# Patient Record
Sex: Female | Born: 1945 | Race: White | Hispanic: No | Smoking: Former smoker
Health system: Southern US, Community
[De-identification: ages and names within clinical notes are randomized; demographics above are authoritative.]

## PROBLEM LIST (undated history)

## (undated) DIAGNOSIS — E669 Obesity, unspecified: Secondary | ICD-10-CM

## (undated) DIAGNOSIS — F32A Depression, unspecified: Secondary | ICD-10-CM

## (undated) DIAGNOSIS — J439 Emphysema, unspecified: Secondary | ICD-10-CM

## (undated) DIAGNOSIS — D649 Anemia, unspecified: Secondary | ICD-10-CM

## (undated) DIAGNOSIS — J189 Pneumonia, unspecified organism: Secondary | ICD-10-CM

## (undated) DIAGNOSIS — R262 Difficulty in walking, not elsewhere classified: Secondary | ICD-10-CM

## (undated) DIAGNOSIS — F329 Major depressive disorder, single episode, unspecified: Secondary | ICD-10-CM

## (undated) DIAGNOSIS — K5792 Diverticulitis of intestine, part unspecified, without perforation or abscess without bleeding: Secondary | ICD-10-CM

## (undated) DIAGNOSIS — B0229 Other postherpetic nervous system involvement: Secondary | ICD-10-CM

## (undated) DIAGNOSIS — M719 Bursopathy, unspecified: Secondary | ICD-10-CM

## (undated) DIAGNOSIS — D369 Benign neoplasm, unspecified site: Secondary | ICD-10-CM

## (undated) DIAGNOSIS — T148XXA Other injury of unspecified body region, initial encounter: Secondary | ICD-10-CM

## (undated) DIAGNOSIS — M2550 Pain in unspecified joint: Secondary | ICD-10-CM

## (undated) DIAGNOSIS — E039 Hypothyroidism, unspecified: Secondary | ICD-10-CM

## (undated) DIAGNOSIS — M419 Scoliosis, unspecified: Secondary | ICD-10-CM

## (undated) DIAGNOSIS — E7800 Pure hypercholesterolemia, unspecified: Secondary | ICD-10-CM

## (undated) DIAGNOSIS — Z8739 Personal history of other diseases of the musculoskeletal system and connective tissue: Secondary | ICD-10-CM

## (undated) DIAGNOSIS — G8929 Other chronic pain: Secondary | ICD-10-CM

## (undated) DIAGNOSIS — K589 Irritable bowel syndrome without diarrhea: Secondary | ICD-10-CM

## (undated) DIAGNOSIS — I82409 Acute embolism and thrombosis of unspecified deep veins of unspecified lower extremity: Secondary | ICD-10-CM

## (undated) DIAGNOSIS — F429 Obsessive-compulsive disorder, unspecified: Secondary | ICD-10-CM

## (undated) DIAGNOSIS — F439 Reaction to severe stress, unspecified: Secondary | ICD-10-CM

## (undated) DIAGNOSIS — M545 Low back pain, unspecified: Secondary | ICD-10-CM

## (undated) DIAGNOSIS — C55 Malignant neoplasm of uterus, part unspecified: Secondary | ICD-10-CM

## (undated) DIAGNOSIS — IMO0002 Reserved for concepts with insufficient information to code with codable children: Secondary | ICD-10-CM

## (undated) DIAGNOSIS — G4733 Obstructive sleep apnea (adult) (pediatric): Secondary | ICD-10-CM

## (undated) DIAGNOSIS — K219 Gastro-esophageal reflux disease without esophagitis: Secondary | ICD-10-CM

## (undated) DIAGNOSIS — I10 Essential (primary) hypertension: Secondary | ICD-10-CM

## (undated) DIAGNOSIS — M1990 Unspecified osteoarthritis, unspecified site: Secondary | ICD-10-CM

## (undated) DIAGNOSIS — J309 Allergic rhinitis, unspecified: Secondary | ICD-10-CM

## (undated) DIAGNOSIS — F0390 Unspecified dementia without behavioral disturbance: Secondary | ICD-10-CM

## (undated) DIAGNOSIS — M797 Fibromyalgia: Secondary | ICD-10-CM

## (undated) HISTORY — DX: Pure hypercholesterolemia, unspecified: E78.00

## (undated) HISTORY — PX: OTHER SURGICAL HISTORY: SHX169

## (undated) HISTORY — DX: Emphysema, unspecified: J43.9

## (undated) HISTORY — DX: Unspecified osteoarthritis, unspecified site: M19.90

## (undated) HISTORY — DX: Personal history of other diseases of the musculoskeletal system and connective tissue: Z87.39

## (undated) HISTORY — DX: Irritable bowel syndrome, unspecified: K58.9

## (undated) HISTORY — PX: HYSTEROTOMY: SHX1776

## (undated) HISTORY — DX: Malignant neoplasm of uterus, part unspecified: C55

## (undated) HISTORY — DX: Obesity, unspecified: E66.9

## (undated) HISTORY — DX: Pain in unspecified joint: M25.50

## (undated) HISTORY — DX: Reserved for concepts with insufficient information to code with codable children: IMO0002

## (undated) HISTORY — DX: Hypothyroidism, unspecified: E03.9

## (undated) HISTORY — DX: Obsessive-compulsive disorder, unspecified: F42.9

## (undated) HISTORY — DX: Scoliosis, unspecified: M41.9

## (undated) HISTORY — PX: BREAST LUMPECTOMY: SHX2

## (undated) HISTORY — DX: Benign neoplasm, unspecified site: D36.9

## (undated) HISTORY — DX: Obstructive sleep apnea (adult) (pediatric): G47.33

## (undated) HISTORY — PX: TONSILLECTOMY: SUR1361

## (undated) HISTORY — DX: Allergic rhinitis, unspecified: J30.9

## (undated) HISTORY — DX: Gastro-esophageal reflux disease without esophagitis: K21.9

## (undated) HISTORY — DX: Bursopathy, unspecified: M71.9

## (undated) HISTORY — DX: Low back pain, unspecified: M54.50

## (undated) HISTORY — DX: Other chronic pain: G89.29

## (undated) HISTORY — DX: Major depressive disorder, single episode, unspecified: F32.9

## (undated) HISTORY — PX: CHOLECYSTECTOMY: SHX55

## (undated) HISTORY — DX: Other postherpetic nervous system involvement: B02.29

## (undated) HISTORY — PX: ABDOMINAL HYSTERECTOMY: SHX81

## (undated) HISTORY — DX: Low back pain: M54.5

## (undated) HISTORY — DX: Diverticulitis of intestine, part unspecified, without perforation or abscess without bleeding: K57.92

## (undated) HISTORY — DX: Depression, unspecified: F32.A

## (undated) HISTORY — DX: Reaction to severe stress, unspecified: F43.9

## (undated) HISTORY — DX: Essential (primary) hypertension: I10

---

## 1999-11-01 ENCOUNTER — Ambulatory Visit: Admission: RE | Admit: 1999-11-01 | Discharge: 1999-11-01 | Payer: Self-pay | Admitting: Endocrinology

## 2008-04-20 ENCOUNTER — Emergency Department (HOSPITAL_COMMUNITY): Admission: EM | Admit: 2008-04-20 | Discharge: 2008-04-20 | Payer: Self-pay | Admitting: Emergency Medicine

## 2008-04-25 ENCOUNTER — Encounter: Admission: RE | Admit: 2008-04-25 | Discharge: 2008-04-25 | Payer: Self-pay | Admitting: Internal Medicine

## 2008-07-26 ENCOUNTER — Encounter (INDEPENDENT_AMBULATORY_CARE_PROVIDER_SITE_OTHER): Payer: Self-pay | Admitting: Surgery

## 2008-07-26 ENCOUNTER — Ambulatory Visit (HOSPITAL_COMMUNITY): Admission: RE | Admit: 2008-07-26 | Discharge: 2008-07-27 | Payer: Self-pay | Admitting: Surgery

## 2008-10-16 ENCOUNTER — Emergency Department (HOSPITAL_COMMUNITY): Admission: EM | Admit: 2008-10-16 | Discharge: 2008-10-16 | Payer: Self-pay | Admitting: Emergency Medicine

## 2011-01-12 ENCOUNTER — Encounter: Payer: Self-pay | Admitting: Internal Medicine

## 2011-04-03 ENCOUNTER — Ambulatory Visit
Admission: RE | Admit: 2011-04-03 | Discharge: 2011-04-03 | Disposition: A | Discharge status: Home / Self Care | Payer: Medicare Other | Source: Ambulatory Visit | Attending: Internal Medicine | Admitting: Internal Medicine

## 2011-04-03 ENCOUNTER — Other Ambulatory Visit: Payer: Self-pay | Admitting: Internal Medicine

## 2011-04-09 ENCOUNTER — Encounter: Payer: Self-pay | Admitting: Internal Medicine

## 2011-05-06 NOTE — Assessment & Plan Note (Signed)
[facility]                                 ON-CALL NOTE   NAME:Klein, Joanne                        MRN:          [id number]  DATE:04/20/2008                            DOB:          1946/10/22    PRIMARY CARE PHYSICIAN:  Barbette Hair. [name], MD, [facility]   HISTORY OF PRESENT ILLNESS:  Joanne Klein called tonight.  She is a  patient of Dr. Marzetta Board who has not seen him in 7-8 years.  She has had  nausea for 2-3 weeks and worsening abdominal pain that has become  extreme.  She says her stomach is blown up and she is quite distended  and in a lot of pain.  She has no fever or chills.  She has some  irritable bowels.  Her back is hurting from such severe pain and her  stomach as well.   I instructed her that she should be evaluated in the emergency room.  It  is possible that she has a bowel obstruction.  She has also been  recently started on some new antidepressant medications, but both may be  contributing.     Rachael Fee, MD  Electronically Signed    DPJ/MedQ  DD: 04/20/2008  DT: 04/20/2008  Job #: [id number]   cc:   Barbette Hair. [name], MD,FACG

## 2011-05-06 NOTE — Op Note (Signed)
Joanne Klein, TONNER               ACCOUNT NO.:  [id number]   MEDICAL RECORD NO.:  [id number]          PATIENT TYPE:  OIB   LOCATION:  5127                         FACILITY:  [facility]   PHYSICIAN:  Maisie Fus A. Cornett, M.D.DATE OF BIRTH:  1946-09-21   DATE OF PROCEDURE:  07/26/2008  DATE OF DISCHARGE:                               OPERATIVE REPORT   PREOPERATIVE DIAGNOSIS:  Symptomatic cholelithiasis.   POSTOPERATIVE DIAGNOSIS:  Symptomatic cholelithiasis.   PROCEDURE:  Laparoscopic cholecystectomy.   SURGEON:  Maisie Fus A. Cornett, MD   ASSISTANT:  Ollen Gross. Vernell Morgans, MD   ANESTHESIA:  General endotracheal anesthesia with 0.25% Sensorcaine  local.   ESTIMATED BLOOD LOSS:  20 mL.   SPECIMEN:  Gallbladder to pathology.   DRAINS:  None.   INDICATIONS FOR PROCEDURE:  The patient is a 65 year old female with  symptomatic cholelithiasis.  She presents today for laparoscopic  cholecystectomy for this.   DESCRIPTION OF PROCEDURE:  The patient was brought to the operating room  and placed supine.  After induction of general anesthesia, the abdomen  was prepped and draped in sterile fashion.  A 1-cm supraumbilical  incision was made.  Dissection was carried down to her fascia.  Her  fascia was opened with a scalpel blade.  Kocher was used to grab the  fascia and I used my finger to push the peritoneal lining in the  abdominal cavity.  Pursestring suture of 0 Vicryl was placed and a 12-mm  cannula was placed under direct vision.  Pneumoperitoneum was created to  15 mmHg of CO2 and a laparoscope was placed.  She was placed in reverse  Trendelenburg and rolled to her left.  We then placed an 11-mm  subxiphoid port under direct vision.  Two 5-mm ports were placed in the  right mid abdomen both under direct vision.  The gallbladder was  identified and grabbed by its dome.  The omental adhesions were taken  down.  A second grasper was used to grab the gallbladder at the  infundibulum and pull  it toward the patient's right lower quadrant.  We  used cautery to score the peritoneal lining to expose the infundibulum.  We found the neck of the gallbladder and then found the cystic duct.  We  were able to dissect circumferentially around the neck of the  gallbladder and cystic duct of the gallbladder.  Clips were placed in  the gallbladder side.  A small incision was made in the cystic duct.  We  did this though this was actually in the neck of the gallbladder and  this opened up widely.  I dissected little bit lower around the cystic  duct and got around this circumferentially.  We then tried to slide a  cholangiogram catheter through the neck of the gallbladder but could not  due to obstruction more than likely from a valve since no stones could  be squeezed out of this area.  Her liver function studies were normal  and despite numerous attempts, I could not get the cystic duct catheter  in the cystic duct safely and I was afraid  I was going to tear it and I  felt that since her anatomy seemed very clear to me, we go ahead and  triple clip the cystic duct and divide it and that was actually up at  the neck of the gallbladder.  We went ahead and securely clipped the  cystic duct and divided it.  We then encountered the cystic artery and  actually took these down between clips with its individual branches.  Once we were able to take the cystic artery and its branches down to  include posterior branch as well, we used cautery to dissect the  gallbladder from the gallbladder fossa.  There was a small hole in the  gallbladder with leakage of bile with no stones.  We then inspected the  cystic duct stump remnant as well as clips in the cystic artery and felt  these all to be intact without evidence of bile leakage.  The remainder  of the gallbladder was then put in a EndoCatch bag and extracted through  the umbilicus with closure of the fascial port site with the pursestring  suture of 0  Vicryl already in place.  We reinspected the gallbladder  fossa and found to be hemostatic.  Clips were on the cystic duct and  cystic artery and small branches of the cystic artery.  No evidence of  bile leakage or any aberrant anatomy was noted upon inspection.  At this  point in time, we suctioned out the irrigation.  We then were able to  remove all of our ports under direct vision and allowed the CO2 to  escape.  Skin was closed with 4-0 Monocryl.  Dermabond was applied.  All  final counts of sponge, needle, and instruments were found be correct at  this portion of the case.  The patient was then awoke and taken to  recovery in satisfactory condition.      Thomas A. Cornett, M.D.  Electronically Signed     TAC/MEDQ  D:  07/26/2008  T:  07/27/2008  Job:  [id number]   cc:   Fleet Contras, MD

## 2011-09-16 LAB — DIFFERENTIAL
Basophils Absolute: 0.1
Basophils Relative: 1
Eosinophils Absolute: 0.3
Eosinophils Relative: 3
Lymphocytes Relative: 30
Lymphs Abs: 3.1
Monocytes Absolute: 0.9
Monocytes Relative: 9
Neutro Abs: 6
Neutrophils Relative %: 58

## 2011-09-16 LAB — COMPREHENSIVE METABOLIC PANEL
ALT: 19
AST: 18
Albumin: 3.7
Alkaline Phosphatase: 85
BUN: 13
CO2: 25
Calcium: 9.5
Chloride: 97
Creatinine, Ser: 1
GFR calc Af Amer: >60
GFR calc non Af Amer: 57 — ABNORMAL LOW
Glucose, Bld: 158 — ABNORMAL HIGH
Potassium: 4.2
Sodium: 135
Total Bilirubin: 0.9
Total Protein: 7

## 2011-09-16 LAB — URINALYSIS, ROUTINE W REFLEX MICROSCOPIC
Bilirubin Urine: NEGATIVE
Glucose, UA: NEGATIVE
Hgb urine dipstick: NEGATIVE
Ketones, ur: NEGATIVE
Nitrite: NEGATIVE
Protein, ur: NEGATIVE
Specific Gravity, Urine: 1.021
Urobilinogen, UA: 1
pH: 6

## 2011-09-16 LAB — CBC
HCT: 40
Hemoglobin: 13.4
MCHC: 33.6
MCV: 86.3
Platelets: 327
RBC: 4.63
RDW: 14.4
WBC: 10.4

## 2011-09-16 LAB — URINE CULTURE: Colony Count: 80000

## 2011-09-16 LAB — URINE MICROSCOPIC-ADD ON

## 2011-09-16 LAB — LIPASE, BLOOD: Lipase: 29

## 2011-09-19 LAB — COMPREHENSIVE METABOLIC PANEL
ALT: 20
AST: 17
Albumin: 3.6
Alkaline Phosphatase: 100
BUN: 20
CO2: 25
Calcium: 9.5
Chloride: 106
Creatinine, Ser: 0.79
GFR calc Af Amer: >60
GFR calc non Af Amer: >60
Glucose, Bld: 91
Potassium: 4.4
Sodium: 139
Total Bilirubin: 0.7
Total Protein: 6.8

## 2011-09-19 LAB — CBC
HCT: 42.3
Hemoglobin: 14.3
MCHC: 33.9
MCV: 84.5
Platelets: 279
RBC: 5.01
RDW: 14
WBC: 8.4

## 2011-09-19 LAB — DIFFERENTIAL
Basophils Absolute: 0
Basophils Relative: 1
Eosinophils Absolute: 0.2
Eosinophils Relative: 2
Lymphocytes Relative: 30
Lymphs Abs: 2.5
Monocytes Absolute: 1
Monocytes Relative: 12
Neutro Abs: 4.7
Neutrophils Relative %: 56

## 2011-09-22 LAB — DIFFERENTIAL
Basophils Absolute: 0.1
Basophils Relative: 1
Eosinophils Absolute: 0.3
Eosinophils Relative: 3
Lymphocytes Relative: 29
Lymphs Abs: 2.4
Monocytes Absolute: 0.8
Monocytes Relative: 10
Neutro Abs: 4.9
Neutrophils Relative %: 58

## 2011-09-22 LAB — POCT I-STAT, CHEM 8
BUN: 15
Calcium, Ion: 1.2
Chloride: 103
Creatinine, Ser: 1
Glucose, Bld: 112 — ABNORMAL HIGH
HCT: 40
Hemoglobin: 13.6
Potassium: 4
Sodium: 142
TCO2: 30

## 2011-09-22 LAB — PROTIME-INR
INR: 1
Prothrombin Time: 13.2

## 2011-09-22 LAB — CBC
HCT: 39.5
Hemoglobin: 13.4
MCHC: 33.8
MCV: 85.3
Platelets: 239
RBC: 4.63
RDW: 14
WBC: 8.4

## 2011-09-22 LAB — APTT: aPTT: 26

## 2012-09-30 LAB — LIPID PANEL
Cholesterol: 169 mg/dL (ref 0–200)
HDL: 48 mg/dL (ref 35–70)
LDL Cholesterol: 101 mg/dL
LDl/HDL Ratio: 3.5
Triglycerides: 98 mg/dL (ref 40–160)

## 2012-09-30 LAB — BASIC METABOLIC PANEL
BUN: 19 mg/dL (ref 4–21)
Creatinine: 0.9 mg/dL (ref 0.5–1.1)
Glucose: 94 mg/dL
Potassium: 4.6 mmol/L (ref 3.4–5.3)
Sodium: 143 mmol/L (ref 137–147)

## 2012-09-30 LAB — HEPATIC FUNCTION PANEL
ALT: 13 U/L (ref 7–35)
AST: 14 U/L (ref 13–35)
Alkaline Phosphatase: 86 U/L (ref 25–125)
Bilirubin, Total: 0.6 mg/dL

## 2012-09-30 LAB — HEMOGLOBIN A1C: Hgb A1c MFr Bld: 6 % (ref 4.0–6.0)

## 2013-01-12 ENCOUNTER — Encounter: Payer: Self-pay | Admitting: Hematology

## 2013-04-21 LAB — HM MAMMOGRAPHY

## 2013-08-01 ENCOUNTER — Encounter: Payer: Self-pay | Admitting: Gastroenterology

## 2013-09-02 ENCOUNTER — Encounter: Payer: Self-pay | Admitting: Gastroenterology

## 2013-09-06 ENCOUNTER — Ambulatory Visit: Payer: Medicare Other | Admitting: Gastroenterology

## 2013-11-21 ENCOUNTER — Encounter: Payer: Self-pay | Admitting: Gastroenterology

## 2013-11-21 ENCOUNTER — Ambulatory Visit (INDEPENDENT_AMBULATORY_CARE_PROVIDER_SITE_OTHER): Payer: Medicare Other | Admitting: Gastroenterology

## 2013-11-21 VITALS — BP 126/84 | HR 101 | Ht 67.0 in | Wt 282.0 lb

## 2013-11-21 DIAGNOSIS — R110 Nausea: Secondary | ICD-10-CM | POA: Insufficient documentation

## 2013-11-21 DIAGNOSIS — K625 Hemorrhage of anus and rectum: Secondary | ICD-10-CM

## 2013-11-21 DIAGNOSIS — K5900 Constipation, unspecified: Secondary | ICD-10-CM

## 2013-11-21 NOTE — Assessment & Plan Note (Addendum)
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  Recommendations #1 patient will consider enrollment in a constipation/narcotic use trial #2 colonoscopy.  She has had an incomplete colonoscopy in the past due to difficulty with sedation and with spasm.  Patient will receive propofol and glucagon preprocedure

## 2013-11-21 NOTE — Assessment & Plan Note (Signed)
Chronic rectal bleeding this very likely do to hemorrhoids secondary to straining.  Recommendations #1 once the patient is on a regimen that releaves her from straining, I will reassess symptoms from hemorrhoids.  Should she still have persistent bleeding attributable to hemorrhoids I would consider band ligation

## 2013-11-21 NOTE — Assessment & Plan Note (Signed)
Nausea may be related to constipation.  In addition, she takes narcotics regularly which may be contributing.

## 2013-11-21 NOTE — Addendum Note (Signed)
Addended by: Marlowe Kays on: 11/21/2013 02:50 PM   Modules accepted: Orders

## 2013-11-21 NOTE — Patient Instructions (Signed)
You have been scheduled for a colonoscopy with propofol. Please follow written instructions given to you at your visit today.  Please pick up your prep kit at the pharmacy within the next 1-3 days. If you use inhalers (even only as needed), please bring them with you on the day of your procedure. Your physician has requested that you go to www.startemmi.com and enter the access code given to you at your visit today. This web site gives a general overview about your procedure. However, you should still follow specific instructions given to you by our office regarding your preparation for the procedure.  Research speaking with you today

## 2013-11-21 NOTE — Progress Notes (Signed)
History of Present Illness: 67 year old white female with fibromyalgia, sleep apnea, on chronic pain medications referred for evaluation of constipation.  This has been a chronic problem.  She may go 2-3 days without a bowel movement.  This is followed by severe straining to pass a soft stool or a scatabulous stool.  With most every bowel movement she passes bright red blood mixed with the water and stool.  Colonoscopy in the past has been unsuccessful due to too severe spasm.  She she has been examined on to the splenic flexure where diverticulosis and hemorrhoids were seen.  Last exam according to her chart was in 1998.  She complains of chronic nausea that may be assuaged with eating.  She takes narcotics chronically for back, muscle and joint pain.  She has a remote history of an esophageal stricture that was dilated over 15 years ago.    Past Medical History  Diagnosis Date  . Pure hypercholesterolemia   . Pain in joint, site unspecified   . Obesity, unspecified   . Irritable bowel syndrome   . Situational stress   . Hypothyroidism   . Diverticulitis   . Obstructive sleep apnea   . Arthritis   . Allergic rhinitis   . Scoliosis   . History of fibromyalgia   . Hypertension   . Chronic low back pain   . Neuralgia, post-herpetic    Past Surgical History  Procedure Laterality Date  . Cholecystectomy    . Breast lumpectomy      knot removed  . Tonsillectomy    . Tumor in left forearm    . Hysterotomy     family history includes Diabetes in her father; Liver cancer in her father; Stomach cancer in her father. Current Outpatient Prescriptions  Medication Sig Dispense Refill  . atorvastatin (LIPITOR) 20 MG tablet Take 20 mg by mouth 3 (three) times a week.      . cholecalciferol (VITAMIN D) 400 UNITS TABS Take by mouth daily.      Marland Kitchen Dextroamphetamine Sulfate (DEXEDRINE PO) Take 15 mg by mouth daily. ER caps-2 QD      . fluocinonide cream (LIDEX) 0.05 % Apply topically as needed.       Marland Kitchen levothyroxine (SYNTHROID, LEVOTHROID) 75 MCG tablet Take 75 mcg by mouth daily.        Marland Kitchen omeprazole (PRILOSEC) 40 MG capsule Take 40 mg by mouth daily.       Marland Kitchen oxyCODONE-acetaminophen (PERCOCET) 10-325 MG per tablet Take 1 tablet by mouth every 4 (four) hours as needed.       . Probiotic Product (ALIGN) 4 MG CAPS Take by mouth.      . SERTRALINE HCL PO Take 100 mg by mouth 2 (two) times daily. 2 tabs bid       No current facility-administered medications for this visit.   Allergies as of 11/21/2013 - Review Complete 11/21/2013  Allergen Reaction Noted  . Codeine  04/09/2011  . Morphine and related  04/09/2011  . Prozac [fluoxetine hcl]  04/09/2011  . Sulfa antibiotics  01/12/2013    reports that she has quit smoking. She has never used smokeless tobacco. She reports that she does not drink alcohol or use illicit drugs.     Review of Systems: She complains of chronic back pain, pain in her joints.  Pertinent positive and negative review of systems were noted in the above HPI section. All other review of systems were otherwise negative.  Vital signs were reviewed in today's medical  record Physical Exam: General: Obese female in no acute distress Skin: anicteric Head: Normocephalic and atraumatic Eyes:  sclerae anicteric, EOMI Ears: Normal auditory acuity Mouth: No deformity or lesions Neck: Supple, no masses or thyromegaly Lungs: Clear throughout to auscultation Heart: Regular rate and rhythm; no murmurs, rubs or bruits Abdomen: Soft,  and non distended. No masses, hepatosplenomegaly or hernias noted. Normal Bowel sounds.  There is mild tenderness in the left upper quadrant without guarding or rebound Rectal:deferred Musculoskeletal: Symmetrical with no gross deformities  Skin: No lesions on visible extremities Pulses:  Normal pulses noted Extremities: No clubbing, cyanosis, edema or deformities noted Neurological: Alert oriented x 4, grossly nonfocal Cervical Nodes:  No  significant cervical adenopathy Inguinal Nodes: No significant inguinal adenopathy Psychological:  Alert and cooperative. Normal mood and affect

## 2013-11-22 ENCOUNTER — Encounter: Payer: Self-pay | Admitting: Gastroenterology

## 2013-12-02 ENCOUNTER — Telehealth: Payer: Self-pay | Admitting: Gastroenterology

## 2013-12-02 NOTE — Telephone Encounter (Signed)
Discussed constipation with pt and suggested she try Miralax and titrate to need. Pt verbalized understanding.

## 2013-12-06 ENCOUNTER — Telehealth: Payer: Self-pay | Admitting: Gastroenterology

## 2013-12-07 MED ORDER — NA SULFATE-K SULFATE-MG SULF 17.5-3.13-1.6 GM/177ML PO SOLN: 1.0000 | Freq: Once | ORAL | Status: DC

## 2013-12-07 NOTE — Telephone Encounter (Signed)
Called pt to inform prep  sent  

## 2014-01-10 ENCOUNTER — Encounter: Payer: Medicare Other | Admitting: Gastroenterology

## 2014-01-12 ENCOUNTER — Ambulatory Visit (INDEPENDENT_AMBULATORY_CARE_PROVIDER_SITE_OTHER): Payer: Medicare HMO | Admitting: Physician Assistant

## 2014-01-12 ENCOUNTER — Encounter: Payer: Self-pay | Admitting: Physician Assistant

## 2014-01-12 VITALS — BP 130/78 | HR 82 | Temp 97.6°F | Resp 20 | Wt 279.8 lb

## 2014-01-12 DIAGNOSIS — F3289 Other specified depressive episodes: Secondary | ICD-10-CM

## 2014-01-12 DIAGNOSIS — E039 Hypothyroidism, unspecified: Secondary | ICD-10-CM

## 2014-01-12 DIAGNOSIS — K5909 Other constipation: Secondary | ICD-10-CM

## 2014-01-12 DIAGNOSIS — K5900 Constipation, unspecified: Secondary | ICD-10-CM

## 2014-01-12 DIAGNOSIS — F32A Depression, unspecified: Secondary | ICD-10-CM

## 2014-01-12 DIAGNOSIS — K219 Gastro-esophageal reflux disease without esophagitis: Secondary | ICD-10-CM

## 2014-01-12 DIAGNOSIS — F329 Major depressive disorder, single episode, unspecified: Secondary | ICD-10-CM

## 2014-01-12 MED ORDER — LEVOTHYROXINE SODIUM 75 MCG PO TABS: 75.0000 ug | ORAL_TABLET | Freq: Every day | ORAL | Status: DC

## 2014-01-12 MED ORDER — OMEPRAZOLE 40 MG PO CPDR: 40.0000 mg | DELAYED_RELEASE_CAPSULE | Freq: Every day | ORAL | Status: DC

## 2014-01-12 MED ORDER — SERTRALINE HCL 100 MG PO TABS: ORAL_TABLET | ORAL | Status: DC

## 2014-01-12 NOTE — Patient Instructions (Signed)
It was nice to meet you today Joanne Klein!  I have ordered your refills as we discussed.  I have placed the referral to gastroenterology for Dr. Deatra Ina.  Please continue to take medications as prescribed.   Please schedule an appointment in the next few months for a complete physical.  Depression, Adult Depression refers to feeling sad, low, down in the dumps, blue, gloomy, or empty. In general, there are two kinds of depression: 1. Depression that we all experience from time to time because of upsetting life experiences, including the loss of a job or the ending of a relationship (normal sadness or normal grief). This kind of depression is considered normal, is short lived, and resolves within a few days to 2 weeks. (Depression experienced after the loss of a loved one is called bereavement. Bereavement often lasts longer than 2 weeks but normally gets better with time.) 2. Clinical depression, which lasts longer than normal sadness or normal grief or interferes with your ability to function at home, at work, and in school. It also interferes with your personal relationships. It affects almost every aspect of your life. Clinical depression is an illness. Symptoms of depression also can be caused by conditions other than normal sadness and grief or clinical depression. Examples of these conditions are listed as follows:  Physical illness Some physical illnesses, including underactive thyroid gland (hypothyroidism), severe anemia, specific types of cancer, diabetes, uncontrolled seizures, heart and lung problems, strokes, and chronic pain are commonly associated with symptoms of depression.  Side effects of some prescription medicine In some people, certain types of prescription medicine can cause symptoms of depression.  Substance abuse Abuse of alcohol and illicit drugs can cause symptoms of depression. SYMPTOMS Symptoms of normal sadness and normal grief include the following:  Feeling sad or  crying for short periods of time.  Not caring about anything (apathy).  Difficulty sleeping or sleeping too much.  No longer able to enjoy the things you used to enjoy.  Desire to be by oneself all the time (social isolation).  Lack of energy or motivation.  Difficulty concentrating or remembering.  Change in appetite or weight.  Restlessness or agitation. Symptoms of clinical depression include the same symptoms of normal sadness or normal grief and also the following symptoms:  Feeling sad or crying all the time.  Feelings of guilt or worthlessness.  Feelings of hopelessness or helplessness.  Thoughts of suicide or the desire to harm yourself (suicidal ideation).  Loss of touch with reality (psychotic symptoms). Seeing or hearing things that are not real (hallucinations) or having false beliefs about your life or the people around you (delusions and paranoia). DIAGNOSIS  The diagnosis of clinical depression usually is based on the severity and duration of the symptoms. Your caregiver also will ask you questions about your medical history and substance use to find out if physical illness, use of prescription medicine, or substance abuse is causing your depression. Your caregiver also may order blood tests. TREATMENT  Typically, normal sadness and normal grief do not require treatment. However, sometimes antidepressant medicine is prescribed for bereavement to ease the depressive symptoms until they resolve. The treatment for clinical depression depends on the severity of your symptoms but typically includes antidepressant medicine, counseling with a mental health professional, or a combination of both. Your caregiver will help to determine what treatment is best for you. Depression caused by physical illness usually goes away with appropriate medical treatment of the illness. If prescription medicine is causing  depression, talk with your caregiver about stopping the medicine,  decreasing the dose, or substituting another medicine. Depression caused by abuse of alcohol or illicit drugs abuse goes away with abstinence from these substances. Some adults need professional help in order to stop drinking or using drugs. SEEK IMMEDIATE CARE IF:  You have thoughts about hurting yourself or others.  You lose touch with reality (have psychotic symptoms).  You are taking medicine for depression and have a serious side effect. FOR MORE INFORMATION National Alliance on Mental Illness: www.nami.[employer] of Mental [employer]: https://carter.com/ Document Released: 12/05/2000 Document Revised: 06/08/2012 Document Reviewed: 03/08/2012 [facility] Patient Information 2014 [place]. Diet for Gastroesophageal Reflux Disease, Adult Reflux (acid reflux) is when acid from your stomach flows up into the esophagus. When acid comes in contact with the esophagus, the acid causes irritation and soreness (inflammation) in the esophagus. When reflux happens often or so severely that it causes damage to the esophagus, it is called gastroesophageal reflux disease (GERD). Nutrition therapy can help ease the discomfort of GERD. FOODS OR DRINKS TO AVOID OR LIMIT  Smoking or chewing tobacco. Nicotine is one of the most potent stimulants to acid production in the gastrointestinal tract.  Caffeinated and decaffeinated coffee and black tea.  Regular or low-calorie carbonated beverages or energy drinks (caffeine-free carbonated beverages are allowed).   Strong spices, such as black pepper, white pepper, red pepper, cayenne, curry powder, and chili powder.  Peppermint or spearmint.  Chocolate.  High-fat foods, including meats and fried foods. Extra added fats including oils, butter, salad dressings, and nuts. Limit these to less than 8 tsp per day.  Fruits and vegetables if they are not tolerated, such as citrus fruits or tomatoes.  Alcohol.  Any food that seems to aggravate your  condition. If you have questions regarding your diet, call your caregiver or a registered dietitian. OTHER THINGS THAT MAY HELP GERD INCLUDE:   Eating your meals slowly, in a relaxed setting.  Eating 5 to 6 small meals per day instead of 3 large meals.  Eliminating food for a period of time if it causes distress.  Not lying down until 3 hours after eating a meal.  Keeping the head of your bed raised 6 to 9 inches (15 to 23 cm) by using a foam wedge or blocks under the legs of the bed. Lying flat may make symptoms worse.  Being physically active. Weight loss may be helpful in reducing reflux in overweight or obese adults.  Wear loose fitting clothing EXAMPLE MEAL PLAN This meal plan is approximately 2,000 calories based on [employer] meal planning guidelines. Breakfast   cup cooked oatmeal.  1 cup strawberries.  1 cup low-fat milk.  1 oz almonds. Snack  1 cup cucumber slices.  6 oz yogurt (made from low-fat or fat-free milk). Lunch  2 slice whole-wheat bread.  2 oz sliced [place].  2 tsp mayonnaise.  1 cup blueberries.  1 cup snap peas. Snack  6 whole-wheat crackers.  1 oz string cheese. Dinner   cup brown rice.  1 cup mixed veggies.  1 tsp olive oil.  3 oz grilled fish. Document Released: 12/08/2005 Document Revised: 03/01/2012 Document Reviewed: 10/24/2011 [facility] Patient Information 2014 [place], [place].

## 2014-01-12 NOTE — Progress Notes (Signed)
Subjective:    Patient ID: Joanne Klein, female    DOB: 1946-04-07, 68 y.o.   MRN: [id number]  HPI Comments: Patient is a 68 year old female who presents to the office to establish care. Patient reports she has had regular scheduled primary care with Dr. Marlou Sa however, this January she switched insurance to [facility] and her previous PCP does not accept [employer]. Patient reports a long history of multiple chronic health problems stating are well controlled on her current medications. States she has been evaluated by Dr. Deatra Ina, gastroenterologist, for chronic constipation and rectal bleeding and he would like to schedule her for a colonscopy however, requires a referral from her PCP. Requires refills for some medications. No other concerns at this time. Denies recent history of chest pain/palpitations, extremity swelling, SOB, cough, visual disturbance/change, eye pain, headache, lightheaded or dizzy.    Review of Systems  Constitutional: Negative for fever and appetite change.  Eyes: Negative for pain and visual disturbance.  Respiratory: Negative for cough and shortness of breath.   Cardiovascular: Negative for chest pain, palpitations and leg swelling.  Gastrointestinal: Positive for nausea (intermittent), constipation and anal bleeding (follows with GI). Negative for vomiting.  Musculoskeletal:       Chronic back, neck and bilateral shoulder pain.   Skin: Negative for rash.  Neurological: Negative for dizziness, weakness, numbness and headaches.  All other systems reviewed and are negative.      Objective:   Physical Exam  Vitals reviewed. Constitutional: She is oriented to person, place, and time. She appears well-developed and well-nourished. No distress.  HENT:  Head: Normocephalic and atraumatic.  Right Ear: External ear normal.  Left Ear: External ear normal.  Nose: Nose normal.  Mouth/Throat: Oropharynx is clear and moist.  Eyes: Conjunctivae are normal.  Neck: Normal range of  motion.  Cardiovascular: Normal rate, regular rhythm and normal heart sounds.  Exam reveals no gallop and no friction rub.   No murmur heard. Pulses:      Radial pulses are 2+ on the right side, and 2+ on the left side.  Pulmonary/Chest: Effort normal and breath sounds normal. She has no wheezes. She has no rhonchi. She has no rales.  Abdominal: Soft. There is no tenderness.  Neurological: She is alert and oriented to person, place, and time.  Skin: Skin is warm and dry.  Psychiatric: She has a normal mood and affect.   Past Medical History  Diagnosis Date  . Pure hypercholesterolemia   . Pain in joint, site unspecified   . Obesity, unspecified   . Irritable bowel syndrome   . Situational stress   . Hypothyroidism   . Diverticulitis   . Obstructive sleep apnea   . Arthritis   . Allergic rhinitis   . Scoliosis   . History of fibromyalgia   . Hypertension   . Chronic low back pain   . Neuralgia, post-herpetic    Family History  Problem Relation Age of Onset  . Stomach cancer Father   . Liver cancer Father   . Diabetes Father    History   Social History  . Marital Status: Divorced    Spouse Name: N/A    Number of Children: N/A  . Years of Education: N/A   Social History Main Topics  . Smoking status: Former Research scientist (life sciences)  . Smokeless tobacco: Never Used  . Alcohol Use: No     Comment: Caffine Intake 2x's daily  . Drug Use: No  . Sexual Activity: None  Other Topics Concern  . None   Social History Narrative  . None   Past Surgical History  Procedure Laterality Date  . Cholecystectomy    . Breast lumpectomy      knot removed  . Tonsillectomy    . Tumor in left forearm    . Hysterotomy         Assessment & Plan:   Visit to establish care. Reviewed patients list of chronic diagnosis and medication list.   Counseled patient to continue medications as instructed. Patient to return in 2 -3 months for cpx and health maintenance. Provided patient release of  record form will review records and further update chart when arrive.  1. GERD Rx refill for Omeprazole Cap 40 mg one tab daily  2. Hypothyroidism Rx refill for Levothyroxine 0.075 mg one tab daily  3. Depression Rx refill for Sertraline 100 mg, two tabs daily at bedtime  4. Constipation Referral for gastroenterology

## 2014-01-12 NOTE — Progress Notes (Signed)
Pre-visit discussion using our clinic review tool. No additional management support is needed unless otherwise documented below in the visit note.  

## 2014-01-13 ENCOUNTER — Encounter: Payer: Self-pay | Admitting: Physician Assistant

## 2014-01-13 DIAGNOSIS — F329 Major depressive disorder, single episode, unspecified: Secondary | ICD-10-CM | POA: Insufficient documentation

## 2014-01-13 DIAGNOSIS — K219 Gastro-esophageal reflux disease without esophagitis: Secondary | ICD-10-CM | POA: Insufficient documentation

## 2014-01-13 DIAGNOSIS — F429 Obsessive-compulsive disorder, unspecified: Secondary | ICD-10-CM | POA: Insufficient documentation

## 2014-01-13 DIAGNOSIS — K5792 Diverticulitis of intestine, part unspecified, without perforation or abscess without bleeding: Secondary | ICD-10-CM | POA: Insufficient documentation

## 2014-01-13 DIAGNOSIS — M797 Fibromyalgia: Secondary | ICD-10-CM | POA: Insufficient documentation

## 2014-01-13 DIAGNOSIS — E039 Hypothyroidism, unspecified: Secondary | ICD-10-CM | POA: Insufficient documentation

## 2014-01-13 DIAGNOSIS — F32A Depression, unspecified: Secondary | ICD-10-CM | POA: Insufficient documentation

## 2014-01-13 NOTE — Assessment & Plan Note (Signed)
  Rx refill for Omeprazole Cap 40 mg one tab daily

## 2014-01-13 NOTE — Assessment & Plan Note (Signed)
  Rx refill for Sertraline 100 mg, two tabs daily at bedtime

## 2014-01-13 NOTE — Assessment & Plan Note (Signed)
  Referral for gastroenterology

## 2014-01-13 NOTE — Assessment & Plan Note (Signed)
  Rx refill for Levothyroxine 0.075 mg one tab daily

## 2014-01-16 DIAGNOSIS — M1990 Unspecified osteoarthritis, unspecified site: Secondary | ICD-10-CM | POA: Insufficient documentation

## 2014-01-25 ENCOUNTER — Encounter: Payer: Self-pay | Admitting: Gastroenterology

## 2014-01-31 ENCOUNTER — Other Ambulatory Visit: Payer: Self-pay | Admitting: Physician Assistant

## 2014-01-31 DIAGNOSIS — M549 Dorsalgia, unspecified: Principal | ICD-10-CM

## 2014-01-31 DIAGNOSIS — G8929 Other chronic pain: Secondary | ICD-10-CM

## 2014-03-07 ENCOUNTER — Ambulatory Visit (AMBULATORY_SURGERY_CENTER): Payer: Self-pay

## 2014-03-07 VITALS — Ht 65.75 in | Wt 278.6 lb

## 2014-03-07 DIAGNOSIS — R194 Change in bowel habit: Secondary | ICD-10-CM

## 2014-03-07 DIAGNOSIS — R198 Other specified symptoms and signs involving the digestive system and abdomen: Secondary | ICD-10-CM

## 2014-03-21 ENCOUNTER — Encounter: Payer: Self-pay | Admitting: Gastroenterology

## 2014-03-21 ENCOUNTER — Ambulatory Visit (AMBULATORY_SURGERY_CENTER): Payer: Medicare HMO | Admitting: Gastroenterology

## 2014-03-21 VITALS — BP 156/89 | HR 79 | Temp 98.4°F | Resp 22 | Ht 65.0 in | Wt 278.0 lb

## 2014-03-21 DIAGNOSIS — K5730 Diverticulosis of large intestine without perforation or abscess without bleeding: Secondary | ICD-10-CM

## 2014-03-21 DIAGNOSIS — R198 Other specified symptoms and signs involving the digestive system and abdomen: Secondary | ICD-10-CM

## 2014-03-21 DIAGNOSIS — R194 Change in bowel habit: Secondary | ICD-10-CM

## 2014-03-21 DIAGNOSIS — D126 Benign neoplasm of colon, unspecified: Secondary | ICD-10-CM

## 2014-03-21 MED ORDER — SODIUM CHLORIDE 0.9 % IV SOLN: 500.0000 mL | INTRAVENOUS | Status: DC

## 2014-03-21 NOTE — Op Note (Signed)
[place]  [address] [place], [address]   COLONOSCOPY PROCEDURE REPORT  PATIENT: Joanne Klein, Joanne Klein  MR#: [id number] BIRTHDATE: [birth year] , 81  yrs. old GENDER: Female ENDOSCOPIST: Inda Castle, MD REFERRED JA:[id number] Henderson [place], M.D. PROCEDURE DATE:  03/21/2014 PROCEDURE:   Colonoscopy with snare polypectomy First Screening Colonoscopy - Avg.  risk and is 50 yrs.  old or older - No.  Prior Negative Screening - Now for repeat screening. N/A  History of Adenoma - Now for follow-up colonoscopy & has been > or = to 3 yrs.  N/A  Polyps Removed Today? Yes. ASA CLASS:   Class II INDICATIONS:Constipation; history of incomplete colonoscopy 1998. MEDICATIONS: Propofol (Diprivan) 370 mg IV and Glucagon 1 mg IV  DESCRIPTION OF PROCEDURE:   After the risks benefits and alternatives of the procedure were thoroughly explained, informed consent was obtained.  A digital rectal exam revealed no abnormalities of the rectum.   The LB [id number]  endoscope was introduced through the anus and advanced to the cecum, which was identified by both the appendix and ileocecal valve. No adverse events experienced.   The quality of the prep was Suprep good  The instrument was then slowly withdrawn as the colon was fully examined.      COLON FINDINGS: Two sessile polyps measuring 8 mm in size were found in the proximal descending colon.  A polypectomy was performed with a cold snare.  The resection was complete and the polyp tissue was completely retrieved.   There was severe diverticulosis noted in the sigmoid colon and descending colon with associated muscular hypertrophy, luminal narrowing and colonic spasm.   Internal hemorrhoids were found.  Retroflexed views revealed no abnormalities. The time to cecum=5 minutes 10 seconds.  Withdrawal time=24 minutes 06 seconds.  The scope was withdrawn and the procedure completed. COMPLICATIONS: There were no  complications.  ENDOSCOPIC IMPRESSION: 1.   Two sessile polyps measuring 8 mm in size were found in the proximal descending colon; polypectomy was performed with a cold snare 2.   There was severe diverticulosis noted in the sigmoid colon and descending colon 3.   Internal hemorrhoids  RECOMMENDATIONS: 1.  If the polyp(s) removed today are proven to be adenomatous (pre-cancerous) polyps, you will need a repeat colonoscopy in 5 years.  Otherwise you should continue to follow colorectal cancer screening guidelines for "routine risk" patients with colonoscopy in 10 years.  You will receive a letter within 1-2 weeks with the results of your biopsy as well as final recommendations.  Please call my office if you have not received a letter after 3 weeks. 2.  Linzess 153mcg Qd 3.  OV 3-4 weeks   eSigned:  Inda Castle, MD 03/21/2014 11:21 AM   cc:   PATIENT NAME:  Joanne Klein, Joanne Klein MR#: [id number]

## 2014-03-21 NOTE — Progress Notes (Signed)
No complaints noted in the recovery room. Maw   

## 2014-03-21 NOTE — Patient Instructions (Addendum)
YOU HAD AN ENDOSCOPIC PROCEDURE TODAY AT THE [facility]: Refer to the procedure report that was given to you for any specific questions about what was found during the examination.  If the procedure report does not answer your questions, please call your gastroenterologist to clarify.  If you requested that your care partner not be given the details of your procedure findings, then the procedure report has been included in a sealed envelope for you to review at your convenience later.  YOU SHOULD EXPECT: Some feelings of bloating in the abdomen. Passage of more gas than usual.  Walking can help get rid of the air that was put into your GI tract during the procedure and reduce the bloating. If you had a lower endoscopy (such as a colonoscopy or flexible sigmoidoscopy) you may notice spotting of blood in your stool or on the toilet paper. If you underwent a bowel prep for your procedure, then you may not have a normal bowel movement for a few days.  DIET: Your first meal following the procedure should be a light meal and then it is ok to progress to your normal diet.  A half-sandwich or bowl of soup is an example of a good first meal.  Heavy or fried foods are harder to digest and may make you feel nauseous or bloated.  Likewise meals heavy in dairy and vegetables can cause extra gas to form and this can also increase the bloating.  Drink plenty of fluids but you should avoid alcoholic beverages for 24 hours.  ACTIVITY: Your care partner should take you home directly after the procedure.  You should plan to take it easy, moving slowly for the rest of the day.  You can resume normal activity the day after the procedure however you should NOT DRIVE or use heavy machinery for 24 hours (because of the sedation medicines used during the test).    SYMPTOMS TO REPORT IMMEDIATELY: A gastroenterologist can be reached at any hour.  During normal business hours, 8:30 AM to 5:00 PM Monday through Friday,  call [phone].  After hours and on weekends, please call the GI answering service at [phone] who will take a message and have the physician on call contact you.   Following lower endoscopy (colonoscopy or flexible sigmoidoscopy):  Excessive amounts of blood in the stool  Significant tenderness or worsening of abdominal pains  Swelling of the abdomen that is new, acute  Fever of 100F or higher   FOLLOW UP: If any biopsies were taken you will be contacted by phone or by letter within the next 1-3 weeks.  Call your gastroenterologist if you have not heard about the biopsies in 3 weeks.  Our staff will call the home number listed on your records the next business day following your procedure to check on you and address any questions or concerns that you may have at that time regarding the information given to you following your procedure. This is a courtesy call and so if there is no answer at the home number and we have not heard from you through the emergency physician on call, we will assume that you have returned to your regular daily activities without incident.  SIGNATURES/CONFIDENTIALITY: You and/or your care partner have signed paperwork which will be entered into your electronic medical record.  These signatures attest to the fact that that the information above on your After Visit Summary has been reviewed and is understood.  Full responsibility of the confidentiality of   this discharge information lies with you and/or your care-partner.    Handouts were given to your care partner on polyps, diverticulosis and a high fiber diet with liberal fluid intake. Await pathology results. Samples of Liness 138mcg #7 take one daily in am.  Please call and report your progress in 1 week. You may resume your current medications today. Office visit for 3 - 4 weeks.  Dr. Kelby Fam nurse will call with appointment. Please call if any questions or concerns.

## 2014-03-21 NOTE — Progress Notes (Signed)
Called to room to assist during endoscopic procedure.  Patient ID and intended procedure confirmed with present staff. Received instructions for my participation in the procedure from the performing physician.  

## 2014-03-21 NOTE — Progress Notes (Signed)
Report to pacu rn, vss, bbs=clear 

## 2014-03-22 ENCOUNTER — Telehealth: Payer: Self-pay | Admitting: *Deleted

## 2014-03-22 NOTE — Telephone Encounter (Signed)
  Follow up Call-  Call back number 03/21/2014  Post procedure Call Back phone  # [phone]  Permission to leave phone message Yes     Patient questions:  Do you have a fever, pain , or abdominal swelling? no Pain Score  0 *  Have you tolerated food without any problems? yes  Have you been able to return to your normal activities? yes  Do you have any questions about your discharge instructions: Diet   no Medications  no Follow up visit  no  Do you have questions or concerns about your Care? no  Actions: * If pain score is 4 or above: No action needed, pain <4.

## 2014-03-29 ENCOUNTER — Encounter: Payer: Self-pay | Admitting: Gastroenterology

## 2014-03-29 NOTE — Addendum Note (Signed)
Addended by: Lowry Ram on: 03/29/2014 03:59 PM   Modules accepted: Level of Service

## 2014-04-17 ENCOUNTER — Ambulatory Visit (INDEPENDENT_AMBULATORY_CARE_PROVIDER_SITE_OTHER): Payer: Commercial Managed Care - HMO | Admitting: Internal Medicine

## 2014-04-17 ENCOUNTER — Encounter: Payer: Self-pay | Admitting: Internal Medicine

## 2014-04-17 ENCOUNTER — Other Ambulatory Visit (INDEPENDENT_AMBULATORY_CARE_PROVIDER_SITE_OTHER): Payer: Commercial Managed Care - HMO

## 2014-04-17 ENCOUNTER — Ambulatory Visit (INDEPENDENT_AMBULATORY_CARE_PROVIDER_SITE_OTHER)
Admission: RE | Admit: 2014-04-17 | Discharge: 2014-04-17 | Disposition: A | Discharge status: Home / Self Care | Payer: Commercial Managed Care - HMO | Source: Ambulatory Visit | Attending: Internal Medicine | Admitting: Internal Medicine

## 2014-04-17 VITALS — BP 122/74 | HR 105 | Temp 97.6°F | Wt 272.8 lb

## 2014-04-17 DIAGNOSIS — M1990 Unspecified osteoarthritis, unspecified site: Secondary | ICD-10-CM

## 2014-04-17 DIAGNOSIS — M129 Arthropathy, unspecified: Secondary | ICD-10-CM

## 2014-04-17 DIAGNOSIS — R739 Hyperglycemia, unspecified: Secondary | ICD-10-CM

## 2014-04-17 DIAGNOSIS — Z23 Encounter for immunization: Secondary | ICD-10-CM

## 2014-04-17 DIAGNOSIS — E785 Hyperlipidemia, unspecified: Secondary | ICD-10-CM

## 2014-04-17 DIAGNOSIS — M25561 Pain in right knee: Secondary | ICD-10-CM

## 2014-04-17 DIAGNOSIS — M25569 Pain in unspecified knee: Secondary | ICD-10-CM

## 2014-04-17 DIAGNOSIS — E039 Hypothyroidism, unspecified: Secondary | ICD-10-CM

## 2014-04-17 DIAGNOSIS — R7309 Other abnormal glucose: Secondary | ICD-10-CM

## 2014-04-17 DIAGNOSIS — J449 Chronic obstructive pulmonary disease, unspecified: Secondary | ICD-10-CM

## 2014-04-17 LAB — CBC WITH DIFFERENTIAL/PLATELET
Basophils Absolute: 0.1 10*3/uL (ref 0.0–0.1)
Basophils Relative: 0.7 % (ref 0.0–3.0)
Eosinophils Absolute: 0.2 10*3/uL (ref 0.0–0.7)
Eosinophils Relative: 1.9 % (ref 0.0–5.0)
HCT: 41.3 % (ref 36.0–46.0)
Hemoglobin: 13.8 g/dL (ref 12.0–15.0)
Lymphocytes Relative: 33.4 % (ref 12.0–46.0)
Lymphs Abs: 2.7 10*3/uL (ref 0.7–4.0)
MCHC: 33.4 g/dL (ref 30.0–36.0)
MCV: 82.5 fl (ref 78.0–100.0)
Monocytes Absolute: 1 10*3/uL (ref 0.1–1.0)
Monocytes Relative: 12.8 % — ABNORMAL HIGH (ref 3.0–12.0)
Neutro Abs: 4.1 10*3/uL (ref 1.4–7.7)
Neutrophils Relative %: 51.2 % (ref 43.0–77.0)
Platelets: 233 10*3/uL (ref 150.0–400.0)
RBC: 5.01 Mil/uL (ref 3.87–5.11)
RDW: 14.9 % — ABNORMAL HIGH (ref 11.5–14.6)
WBC: 8.1 10*3/uL (ref 4.5–10.5)

## 2014-04-17 LAB — TSH: TSH: 4.94 u[IU]/mL (ref 0.35–5.50)

## 2014-04-17 LAB — HEPATIC FUNCTION PANEL
ALT: 16 U/L (ref 0–35)
AST: 19 U/L (ref 0–37)
Albumin: 3.9 g/dL (ref 3.5–5.2)
Alkaline Phosphatase: 92 U/L (ref 39–117)
Bilirubin, Direct: 0.1 mg/dL (ref 0.0–0.3)
Total Bilirubin: 0.5 mg/dL (ref 0.3–1.2)
Total Protein: 7.3 g/dL (ref 6.0–8.3)

## 2014-04-17 LAB — LIPID PANEL
Cholesterol: 174 mg/dL (ref 0–200)
HDL: 44.8 mg/dL (ref >39.00)
LDL Cholesterol: 103 mg/dL — ABNORMAL HIGH (ref 0–99)
Total CHOL/HDL Ratio: 4
Triglycerides: 133 mg/dL (ref 0.0–149.0)
VLDL: 26.6 mg/dL (ref 0.0–40.0)

## 2014-04-17 LAB — BASIC METABOLIC PANEL
BUN: 25 mg/dL — ABNORMAL HIGH (ref 6–23)
CO2: 28 mEq/L (ref 19–32)
Calcium: 9.4 mg/dL (ref 8.4–10.5)
Chloride: 104 mEq/L (ref 96–112)
Creatinine, Ser: 1.2 mg/dL (ref 0.4–1.2)
GFR: 46.86 mL/min — ABNORMAL LOW (ref >60.00)
Glucose, Bld: 90 mg/dL (ref 70–99)
Potassium: 5.1 mEq/L (ref 3.5–5.1)
Sodium: 140 mEq/L (ref 135–145)

## 2014-04-17 LAB — HEMOGLOBIN A1C: Hgb A1c MFr Bld: 5.8 % (ref 4.6–6.5)

## 2014-04-17 MED ORDER — ALBUTEROL SULFATE HFA 108 (90 BASE) MCG/ACT IN AERS: 2.0000 | INHALATION_SPRAY | Freq: Four times a day (QID) | RESPIRATORY_TRACT | Status: DC | PRN

## 2014-04-17 MED ORDER — ATORVASTATIN CALCIUM 20 MG PO TABS: 20.0000 mg | ORAL_TABLET | Freq: Every day | ORAL | Status: DC

## 2014-04-17 NOTE — Assessment & Plan Note (Signed)
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

## 2014-04-17 NOTE — Progress Notes (Signed)
Subjective:    Patient ID: Joanne Klein, female    DOB: 05/12/1946, 68 y.o.   MRN: [id number]  HPI  Patient is here for follow up - prior pt of Izora Gala PA who no longer works at this location. Reviewed chronic medical issues and interval medical events  Past Medical History  Diagnosis Date  . Pure hypercholesterolemia   . Pain in joint, site unspecified   . Obesity, unspecified   . Irritable bowel syndrome   . Situational stress   . Hypothyroidism   . Diverticulitis   . Obstructive sleep apnea   . Arthritis   . Allergic rhinitis   . Scoliosis   . History of fibromyalgia   . Hypertension   . Chronic low back pain   . Neuralgia, post-herpetic   . Emphysema of lung   . Bursitis     Both Shoulders  . OCD (obsessive compulsive disorder)   . Depression   . GERD (gastroesophageal reflux disease)   . Spastic colon   . Degenerative disk disease     Review of Systems  Constitutional: Negative for fever, fatigue and unexpected weight change.  Respiratory: Positive for shortness of breath (resting and exertion). Negative for cough, chest tightness and wheezing.   Cardiovascular: Negative for chest pain and leg swelling.  Musculoskeletal: Positive for arthralgias (R knee - prior injury >27yrs ago) and back pain (chronic).       Objective:   Physical Exam  BP 122/74  Pulse 105  Temp(Src) 97.6 F (36.4 C) (Oral)  Wt 272 lb 12.8 oz (123.741 kg)  SpO2 90% Wt Readings from Last 3 Encounters:  04/17/14 272 lb 12.8 oz (123.741 kg)  03/21/14 278 lb (126.1 kg)  03/07/14 278 lb 9.6 oz (126.372 kg)   Constitutional: She is MO, but appears well-developed and well-nourished. No distress.  Neck: Normal range of motion. Neck supple. No JVD present. No thyromegaly present.  Cardiovascular: Normal rate, regular rhythm and normal heart sounds.  No murmur heard. No BLE edema. Pulmonary/Chest: Effort normal and breath sounds diminished at bases. No respiratory distress. She has no  wheezes.  MSkel: R knee - boggy synovitis - tender to palpation over joint line; FROM and ligamentous function intact Psychiatric: She has a normal mood and affect. Her behavior is normal. Judgment and thought content normal.   Lab Results  Component Value Date   WBC 8.4 10/16/2008   HGB 13.6 10/16/2008   HCT 40.0 10/16/2008   PLT 239 10/16/2008   GLUCOSE 112* 10/16/2008   CHOL 169 09/30/2012   TRIG 98 09/30/2012   HDL 48 09/30/2012   LDLCALC 101 09/30/2012   ALT 13 09/30/2012   AST 14 09/30/2012   NA 143 09/30/2012   K 4.6 09/30/2012   CL 103 10/16/2008   CREATININE 0.9 09/30/2012   BUN 19 09/30/2012   CO2 25 07/25/2008   INR 1.0 10/16/2008   HGBA1C 6.0 09/30/2012    Dg Chest 2 View  04/03/2011   *RADIOLOGY REPORT*  Clinical Data: Shortness of breath, history of emphysema  CHEST - 2 VIEW  Comparison: Chest x-ray of 07/26/2008  Findings: No active infiltrate or effusion is seen.  Mild peribronchial thickening is noted.  Mediastinal contours appear stable.  The heart is within normal limits in size.  No acute bony abnormality is seen.  IMPRESSION: No active lung disease.  Mild peribronchial thickening.  Original Report Authenticated By: Joretta Bachelor, M.D.      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 Check plain film now, consider referral to orthopedics or sports medicine for consideration of injection as needed pending eventual total knee replacement tylenol prn or NSAIDs recommended    Relevant Orders      DG Knee Complete 4 Views Right      Basic metabolic panel      CBC with Differential      Hepatic function panel   COPD (chronic obstructive pulmonary disease) - Primary     Chronic disease greater than 10 years Prior treatment with nebulizers reviewed, no medications  since 2010 because of insurance changes Patient reports progressive dyspnea, now at rest as well as exertion Not associated with sputum production, wheezing Refer for pulmonary function test to evaluate baseline at this time Refer to pulmonary for evaluation of same, especially in setting of obstructive sleep apnea and baseline borderline hypoxia Consider treatment such as Spiriva, but will await results of PFTs prior to beginning therapy Albuterol MDI when necessary prescribed today    Relevant Medications      ALBUTEROL SULFATE HFA 108 (90 BASE) MCG/ACT IN AERS   Other Relevant Orders      Ambulatory referral to Pulmonology      Pulmonary Function Test   Hyperlipidemia     On statin for years, takes 3 times weekly Check lipids, titrate as needed Refills on atorvastatin provided today    Relevant Medications      atorvastatin (LIPITOR) tablet   Other Relevant Orders      Basic metabolic panel      Lipid panel   Hypothyroidism      No results found for this basename: TSH   Check level, adjust dose as needed    Relevant Orders      Basic metabolic panel      Hepatic function panel      TSH    Other Visit Diagnoses   Right knee pain        Relevant Orders       DG Knee Complete 4 Views Right    Hyperglycemia        Relevant Orders       Basic metabolic panel       Hepatic function panel       Lipid panel       Hemoglobin A1c

## 2014-04-17 NOTE — Patient Instructions (Signed)
It was good to see you today.  We have reviewed your prior records including labs and tests today  Pneumonia vaccine updated today  Test(s) ordered today -labs and xray. Your results will be released to [place] (or called to you) after review, usually within 72hours after test completion. If any changes need to be made, you will be notified at that same time.  Medications reviewed and updated Use Albuterol rescue inhaler as needed- no other changes recommended at this time. Refill on medication(s) as discussed today.  we'll make referral to pulmonary for evaluation/treatment of your COPD and sleep apnea -also for pulmonary function testing to evaluate current state of COPD disease prior to starting medications. Our office will contact you regarding appointment(s) once made.  Please schedule followup in 3-4 months for continued review, call sooner if problems.

## 2014-04-17 NOTE — Progress Notes (Signed)
Pre visit review using our clinic review tool, if applicable. No additional management support is needed unless otherwise documented below in the visit note. 

## 2014-04-17 NOTE — Assessment & Plan Note (Signed)
Chronic disease greater than 10 years Prior treatment with nebulizers reviewed, no medications since 2010 because of insurance changes Patient reports progressive dyspnea, now at rest as well as exertion Not associated with sputum production, wheezing Refer for pulmonary function test to evaluate baseline at this time Refer to pulmonary for evaluation of same, especially in setting of obstructive sleep apnea and baseline borderline hypoxia Consider treatment such as Spiriva, but will await results of PFTs prior to beginning therapy Albuterol MDI when necessary prescribed today

## 2014-04-17 NOTE — Assessment & Plan Note (Signed)
No results found for this basename: TSH   Check level, adjust dose as needed

## 2014-04-17 NOTE — Assessment & Plan Note (Signed)
On statin for years, takes 3 times weekly Check lipids, titrate as needed Refills on atorvastatin provided today

## 2014-04-21 ENCOUNTER — Ambulatory Visit: Payer: Medicare HMO | Admitting: Gastroenterology

## 2014-04-24 ENCOUNTER — Institutional Professional Consult (permissible substitution): Payer: Commercial Managed Care - HMO | Admitting: Internal Medicine

## 2014-05-17 ENCOUNTER — Ambulatory Visit: Payer: Medicare HMO | Admitting: Gastroenterology

## 2014-05-17 ENCOUNTER — Telehealth: Payer: Self-pay | Admitting: Gastroenterology

## 2014-05-18 NOTE — Telephone Encounter (Signed)
No charge. 

## 2014-05-26 ENCOUNTER — Ambulatory Visit (INDEPENDENT_AMBULATORY_CARE_PROVIDER_SITE_OTHER): Payer: Commercial Managed Care - HMO | Admitting: Internal Medicine

## 2014-05-26 ENCOUNTER — Encounter: Payer: Self-pay | Admitting: Internal Medicine

## 2014-05-26 ENCOUNTER — Ambulatory Visit (INDEPENDENT_AMBULATORY_CARE_PROVIDER_SITE_OTHER)
Admission: RE | Admit: 2014-05-26 | Discharge: 2014-05-26 | Disposition: A | Discharge status: Home / Self Care | Payer: Commercial Managed Care - HMO | Source: Ambulatory Visit | Attending: Internal Medicine | Admitting: Internal Medicine

## 2014-05-26 VITALS — BP 104/64 | HR 98 | Temp 98.2°F | Ht 66.25 in | Wt 271.0 lb

## 2014-05-26 DIAGNOSIS — J449 Chronic obstructive pulmonary disease, unspecified: Secondary | ICD-10-CM

## 2014-05-26 MED ORDER — UMECLIDINIUM-VILANTEROL 62.5-25 MCG/INH IN AEPB: 2.0000 | INHALATION_SPRAY | Freq: Once | RESPIRATORY_TRACT | Status: DC

## 2014-05-26 NOTE — Patient Instructions (Signed)
Try anoro  each am x one month and then return  Only use your albuterol as a rescue medication to be used if you can't catch your breath by resting or doing a relaxed purse lip breathing pattern.  - The less you use it, the better it will work when you need it. - Ok to use up to 2 puffs  every 4 hours if you must but call for immediate appointment if use goes up over your usual need - Don't leave home without it !!  (think of it like the spare tire for your car)   Please remember to go to the  x-ray department downstairs for your tests - we will call you with the results when they are available.  Please schedule a follow up office visit in 4 weeks, sooner if needed

## 2014-05-26 NOTE — Progress Notes (Signed)
   Subjective:    Patient ID: Joanne Klein, female    DOB: 21-Feb-1946  MRN: [id number]  HPI  37 yowm quit smoking 1997 at wt around 130 with mild doe and voice problems > better but breathing gradually worse with progressive wt gain dx as copd/e in 2000 and worse since then to poin to walking across a room at home > stops frequently and no change.  05/26/2014 1st [facility] office visit/ Joanne Klein  Chief Complaint  Patient presents with  . Pulmonary Consult    Referred per Dr. Asa Lente. Pt states that she was dxed with Emphysema in 2000. She c/o SOB that has been slowly getting worse since she was dxed. She also c/o prod cough with minimal clear sputum- relates to PND.    assoc with sense of throat congestion with esp am x one tbsp each am clear mucus Uses saba 4 x avg in one week not sure it helps  Neb worked better  Doe x across the room    No obvious day to day or daytime variabilty or assoc   cp or chest tightness, subjective wheeze or overt sinus  symptoms. No unusual exp hx or h/o childhood pna/ asthma or knowledge of premature birth.  Sleeping ok on cpap without nocturnal  or early am exacerbation  of respiratory  c/o's or need for noct saba. Also denies any obvious fluctuation of symptoms with weather or environmental changes or other aggravating or alleviating factors except as outlined above   Current Medications, Allergies, Complete Past Medical History, Past Surgical History, Family History, and Social History were reviewed in [employer] record.          Review of Systems  Constitutional: Negative for fever, chills and unexpected weight change.  HENT: Positive for congestion and sneezing. Negative for dental problem, ear pain, nosebleeds, postnasal drip, rhinorrhea, sinus pressure, sore throat, trouble swallowing and voice change.   Eyes: Negative for visual disturbance.  Respiratory: Positive for cough and shortness of breath. Negative for  choking.   Cardiovascular: Negative for chest pain and leg swelling.  Gastrointestinal: Positive for abdominal pain. Negative for vomiting and diarrhea.  Genitourinary: Negative for difficulty urinating.       Heartburn Indigestion  Musculoskeletal: Positive for arthralgias.  Skin: Negative for rash.  Neurological: Negative for tremors, syncope and headaches.  Hematological: Does not bruise/bleed easily.       Objective:   Physical Exam  amb obese wf nad sob getting on exam table  Wt Readings from Last 3 Encounters:  05/26/14 271 lb (122.925 kg)  04/17/14 272 lb 12.8 oz (123.741 kg)  03/21/14 278 lb (126.1 kg)      HEENT mild turbinate edema.  Oropharynx no thrush or excess pnd or cobblestoning.  No JVD or cervical adenopathy. Mild accessory muscle hypertrophy. Trachea midline, nl thryroid. Chest was hyperinflated by percussion with diminished breath sounds and moderate increased exp time without wheeze. Hoover sign positive at mid inspiration. Regular rate and rhythm without murmur gallop or rub or increase P2 or edema.  Abd: no hsm, nl excursion. Ext warm without cyanosis or clubbing.      cxr 05/26/14 Evidence of a degree of underlying emphysematous change. Mild right perihilar scarring and left base scarring. No edema or consolidation     Assessment & Plan:

## 2014-05-26 NOTE — Progress Notes (Signed)
PFT done today. 

## 2014-05-27 NOTE — Assessment & Plan Note (Signed)
PFTs 05/26/2014  1.55 (59%) ratio 60 no better p B2  and DLCO 59% corrects to 65   Her hx is typical of a mild/moderate copd with over a hundred pounds of wt gain since she stopped smoking and now symptoms of severe doe that are out of porportion to airflow obstruction typical of a group C GOLD pt where LAMA or LABA or LAMA/LABA best choice per guidelines  Therefore red anoro one qam and re-eval in 4 weeks  The proper method of use, as well as anticipated side effects, of a dry powder inhaler are discussed and demonstrated to the patient. Improved effectiveness after extensive coaching during this visit to a level of approximately  90%

## 2014-05-29 NOTE — Progress Notes (Signed)
Quick Note:  Spoke with pt and notified of results per Dr. Wert. Pt verbalized understanding and denied any questions.  ______ 

## 2014-05-30 LAB — PULMONARY FUNCTION TEST
DL/VA % pred: 65 %
DL/VA: 3.32 ml/min/mmHg/L
DLCO unc % pred: 59 %
DLCO unc: 16.12 ml/min/mmHg
FEF 25-75 Post: 0.87 L/sec
FEF 25-75 Pre: 0.69 L/sec
FEF2575-%Change-Post: 26 %
FEF2575-%Pred-Post: 39 %
FEF2575-%Pred-Pre: 31 %
FEV1-%Change-Post: 10 %
FEV1-%Pred-Post: 66 %
FEV1-%Pred-Pre: 59 %
FEV1-Post: 1.72 L
FEV1-Pre: 1.55 L
FEV1FVC-%Change-Post: 10 %
FEV1FVC-%Pred-Pre: 77 %
FEV6-%Change-Post: 0 %
FEV6-%Pred-Post: 77 %
FEV6-%Pred-Pre: 77 %
FEV6-Post: 2.55 L
FEV6-Pre: 2.52 L
FEV6FVC-%Change-Post: 0 %
FEV6FVC-%Pred-Post: 101 %
FEV6FVC-%Pred-Pre: 101 %
FVC-%Change-Post: 0 %
FVC-%Pred-Post: 76 %
FVC-%Pred-Pre: 76 %
FVC-Post: 2.62 L
FVC-Pre: 2.6 L
Post FEV1/FVC ratio: 66 %
Post FEV6/FVC ratio: 98 %
Pre FEV1/FVC ratio: 60 %
Pre FEV6/FVC Ratio: 97 %
RV % pred: 109 %
RV: 2.43 L
TLC % pred: 100 %
TLC: 5.41 L

## 2014-06-26 ENCOUNTER — Ambulatory Visit (INDEPENDENT_AMBULATORY_CARE_PROVIDER_SITE_OTHER): Payer: Commercial Managed Care - HMO | Admitting: Internal Medicine

## 2014-06-26 ENCOUNTER — Encounter: Payer: Self-pay | Admitting: Internal Medicine

## 2014-06-26 VITALS — BP 128/84 | HR 104 | Temp 98.1°F | Ht 66.0 in | Wt 267.4 lb

## 2014-06-26 DIAGNOSIS — J449 Chronic obstructive pulmonary disease, unspecified: Secondary | ICD-10-CM

## 2014-06-26 MED ORDER — TIOTROPIUM BROMIDE MONOHYDRATE 2.5 MCG/ACT IN AERS: INHALATION_SPRAY | RESPIRATORY_TRACT | Status: DC

## 2014-06-26 NOTE — Patient Instructions (Addendum)
Spiriva 2 puffs each and if happy fill the prescription, if not return here   Weight control is simply a matter of calorie balance which needs to be tilted in your favor by eating less and exercising more.  To get the most out of exercise, you need to be continuously aware that you are short of breath, but never out of breath, for 30 minutes daily. As you improve, it will actually be easier for you to do the same amount of exercise  in  30 minutes so always push to the level where you are short of breath.  If this does not result in gradual weight reduction then I strongly recommend you see a nutritionist with a food diary x 2 weeks so that we can work out a negative calorie balance which is universally effective in steady weight loss programs.  Think of your calorie balance like you do your bank account where in this case you want the balance to go down so you must take in less calories than you burn up.  It's just that simple:  Hard to do, but easy to understand.  Good luck!

## 2014-06-26 NOTE — Assessment & Plan Note (Addendum)
TSH ok 04/17/14  Calorie balance issue reviewed.   See instructions for specific recommendations which were reviewed directly with the patient who was given a copy with highlighter outlining the key components.   Pulmonary f/u is prn

## 2014-06-26 NOTE — Progress Notes (Signed)
Subjective:    Patient ID: Joanne Klein, female    DOB: 31-Aug-1946  MRN: [id number]   Brief patient profile:  68 yowm quit smoking 1997 at wt around 130 with mild doe and voice problems > better but breathing gradually worse with progressive wt gain dx as copd/e in 2000 and worse since then to poin to walking across a room at home > stops frequently and no change.    History of Present Illness  05/26/2014 1st [facility] office visit/ Joanne Klein  Chief Complaint  Patient presents with  . Pulmonary Consult    Referred per Dr. Asa Lente. Pt states that she was dxed with Emphysema in 2000. She c/o SOB that has been slowly getting worse since she was dxed. She also c/o prod cough with minimal clear sputum- relates to PND.    assoc with sense of throat congestion with esp am x one tbsp each am clear mucus Uses saba 4 x avg in one week not sure it helps  Neb worked better  Doe x across the room  rec Try anoro  each am x one month and then return Only use your albuterol as a rescue medication     06/26/2014 f/u ov/Joanne Klein re: copd GOLD II no better on anoro but only took a few doses before bothered mouth Chief Complaint  Patient presents with  . Follow-up    Still having sob, wheezing and dry cough   No trouble at rest or sleeping, just with walking > room to room  Cough is daytime, non-productive already on PPi daily  No benefit from saba either   No obvious day to day or daytime variabilty or assoc  cp or chest tightness, subjective wheeze overt sinus or hb symptoms. No unusual exp hx or h/o childhood pna/ asthma or knowledge of premature birth.  Sleeping ok without nocturnal  or early am exacerbation  of respiratory  c/o's or need for noct saba. Also denies any obvious fluctuation of symptoms with weather or environmental changes or other aggravating or alleviating factors except as outlined above   Current Medications, Allergies, Complete Past Medical History, Past Surgical History,  Family History, and Social History were reviewed in [employer] record.  ROS  The following are not active complaints unless bolded sore throat, dysphagia, dental problems, itching, sneezing,  nasal congestion or excess/ purulent secretions, ear ache,   fever, chills, sweats, unintended wt loss, pleuritic or exertional cp, hemoptysis,  orthopnea pnd or leg swelling, presyncope, palpitations, heartburn, abdominal pain, anorexia, nausea, vomiting, diarrhea  or change in bowel or urinary habits, change in stools or urine, dysuria,hematuria,  rash, arthralgias, visual complaints, headache, numbness weakness or ataxia or problems with walking or coordination,  change in mood/affect or memory.                    Objective:   Physical Exam  amb obese wf nad   06/26/2014          267  Wt Readings from Last 3 Encounters:  05/26/14 271 lb (122.925 kg)  04/17/14 272 lb 12.8 oz (123.741 kg)  03/21/14 278 lb (126.1 kg)      HEENT mild turbinate edema.  Oropharynx no thrush or excess pnd or cobblestoning.  No JVD or cervical adenopathy. Mild accessory muscle hypertrophy. Trachea midline, nl thryroid. Chest was hyperinflated by percussion with diminished breath sounds and moderate increased exp time without wheeze. Hoover sign positive at mid inspiration. Regular rate and  rhythm without murmur gallop or rub or increase P2 or edema.  Abd: no hsm, nl excursion. Ext warm without cyanosis or clubbing.      cxr 05/26/14 Evidence of a degree of underlying emphysematous change. Mild right perihilar scarring and left base scarring. No edema or consolidation     Assessment & Plan:

## 2014-06-26 NOTE — Assessment & Plan Note (Signed)
PFTs 05/26/2014  1.55 (59%) ratio 60 no better p B2  and DLCO 59% corrects to 65  - trial of anoro 05/26/14  - trial of spiriva 06/26/2014 >> - 06/26/2014  Walked RA x 3 laps @ 185 ft each stopped due to  End of study, sats 90%   Not clear how much she will benefit from LAMA but really didn't give anoro adequate trial so simplify rx with Spriva respimat 2 pffs qam The proper method of use, as well as anticipated side effects, of a metered-dose inhaler are discussed and demonstrated to the patient. Improved effectiveness after extensive coaching during this visit to a level of approximately  90%

## 2014-08-11 ENCOUNTER — Telehealth: Payer: Self-pay | Admitting: Internal Medicine

## 2014-08-11 NOTE — Telephone Encounter (Signed)
We had to reschedule appt out to Oct since Dr. Asa Lente will be out.  She is requesting a refill on dexedrine until she can get in.

## 2014-08-12 ENCOUNTER — Telehealth: Payer: Self-pay

## 2014-08-12 ENCOUNTER — Other Ambulatory Visit: Payer: Self-pay

## 2014-08-14 MED ORDER — DEXTROAMPHETAMINE SULFATE ER 15 MG PO CP24: 15.0000 mg | ORAL_CAPSULE | Freq: Every day | ORAL | Status: DC | PRN

## 2014-08-14 NOTE — Telephone Encounter (Signed)
I do not see any pended med/order Which med and I can advise thanks

## 2014-08-14 NOTE — Telephone Encounter (Signed)
30d rx done

## 2014-08-16 ENCOUNTER — Ambulatory Visit: Payer: Commercial Managed Care - HMO | Admitting: Internal Medicine

## 2014-08-18 NOTE — Telephone Encounter (Signed)
Rx ready for pick up. 

## 2014-09-14 ENCOUNTER — Other Ambulatory Visit: Payer: Self-pay | Admitting: Geriatric Medicine

## 2014-09-14 MED ORDER — DEXTROAMPHETAMINE SULFATE ER 15 MG PO CP24: 15.0000 mg | ORAL_CAPSULE | Freq: Every day | ORAL | Status: DC | PRN

## 2014-09-29 ENCOUNTER — Encounter: Payer: Self-pay | Admitting: Internal Medicine

## 2014-09-29 ENCOUNTER — Ambulatory Visit (INDEPENDENT_AMBULATORY_CARE_PROVIDER_SITE_OTHER): Payer: Commercial Managed Care - HMO | Admitting: Internal Medicine

## 2014-09-29 VITALS — BP 116/78 | HR 96 | Temp 98.6°F | Resp 18 | Ht 66.0 in | Wt 260.4 lb

## 2014-09-29 DIAGNOSIS — G47419 Narcolepsy without cataplexy: Secondary | ICD-10-CM

## 2014-09-29 DIAGNOSIS — J449 Chronic obstructive pulmonary disease, unspecified: Secondary | ICD-10-CM

## 2014-09-29 DIAGNOSIS — K219 Gastro-esophageal reflux disease without esophagitis: Secondary | ICD-10-CM

## 2014-09-29 DIAGNOSIS — F329 Major depressive disorder, single episode, unspecified: Secondary | ICD-10-CM

## 2014-09-29 DIAGNOSIS — F32A Depression, unspecified: Secondary | ICD-10-CM

## 2014-09-29 DIAGNOSIS — E785 Hyperlipidemia, unspecified: Secondary | ICD-10-CM

## 2014-09-29 DIAGNOSIS — E039 Hypothyroidism, unspecified: Secondary | ICD-10-CM

## 2014-09-29 DIAGNOSIS — Z23 Encounter for immunization: Secondary | ICD-10-CM

## 2014-09-29 MED ORDER — DEXTROAMPHETAMINE SULFATE ER 15 MG PO CP24: 15.0000 mg | ORAL_CAPSULE | Freq: Two times a day (BID) | ORAL | Status: DC

## 2014-09-29 MED ORDER — AMPHETAMINE-DEXTROAMPHETAMINE 15 MG PO TABS: 15.0000 mg | ORAL_TABLET | Freq: Two times a day (BID) | ORAL | Status: DC

## 2014-09-29 MED ORDER — AMPHETAMINE-DEXTROAMPHETAMINE 15 MG PO TABS
15.0000 mg | ORAL_TABLET | Freq: Two times a day (BID) | ORAL | Status: DC
Start: 2014-09-29 — End: 2014-09-29

## 2014-09-29 MED ORDER — FLUOCINONIDE 0.05 % EX CREA: TOPICAL_CREAM | CUTANEOUS | Status: DC | PRN

## 2014-09-29 NOTE — Assessment & Plan Note (Signed)
She uses omeprazole as needed.

## 2014-09-29 NOTE — Assessment & Plan Note (Signed)
Recently checked and will continue.

## 2014-09-29 NOTE — Assessment & Plan Note (Signed)
Presumed diagnosis given her dexedrine and she states sleeps all day when not taking. Will rx for 3 months worth but have informed her that I am not trained in these medications and given their high risk side effect profile in the elderly (especially as she is also on thyroid medicine) will not prescribe further for her.

## 2014-09-29 NOTE — Assessment & Plan Note (Signed)
Last lipid profile ok and will recheck at next visit.

## 2014-09-29 NOTE — Assessment & Plan Note (Signed)
Have talked to her about losing weight with diet and exercise and she is not sure she can make any changes right now.

## 2014-09-29 NOTE — Progress Notes (Signed)
Pre visit review using our clinic review tool, if applicable. No additional management support is needed unless otherwise documented below in the visit note. 

## 2014-09-29 NOTE — Patient Instructions (Signed)
We will have you come back in about 6 months for a check up. We have given you the dexedrine for 3 months and would like you to find a psychiatrist that is able to prescribe that for you.

## 2014-09-29 NOTE — Assessment & Plan Note (Signed)
Doing well with spiriva.

## 2014-09-29 NOTE — Assessment & Plan Note (Addendum)
She continues on zoloft 200 mg and has no desire to switch at this time.

## 2014-09-29 NOTE — Progress Notes (Signed)
   Subjective:    Patient ID: Joanne Klein, female    DOB: 29-Oct-1946, 68 y.o.   MRN: [id number]  HPI The patient is a 68 YO female who is coming in to establish care. She has PMH of daytime sleepiness, hypothyroidism, GERD, COPD, arthritis, obesity. She is not having any new problems. She denies chest pains, SOB, abdominal pain. She wants to know if there is an alternative to dexedrine as her new insurance does not cover it well.   Review of Systems  Constitutional: Negative for fever, activity change, appetite change and fatigue.  HENT: Negative.   Eyes: Negative.   Respiratory: Positive for shortness of breath. Negative for cough, chest tightness and wheezing.        With heavy exertion.  Cardiovascular: Negative for chest pain, palpitations and leg swelling.  Gastrointestinal: Positive for constipation. Negative for abdominal pain, diarrhea and abdominal distention.       Chronic issue  Endocrine: Negative.   Musculoskeletal: Positive for arthralgias. Negative for gait problem.  Skin: Negative.   Neurological: Negative.   Psychiatric/Behavioral: Positive for sleep disturbance.       Sleeps too much without meds.      Objective:   Physical Exam  Constitutional: She is oriented to person, place, and time. She appears well-developed and well-nourished.  Obese  HENT:  Head: Normocephalic and atraumatic.  Eyes: EOM are normal.  Neck: Normal range of motion.  Cardiovascular: Normal rate and regular rhythm.   Pulmonary/Chest: Effort normal and breath sounds normal.  Abdominal: Soft. Bowel sounds are normal. She exhibits no distension. There is no tenderness. There is no rebound.  Musculoskeletal: Normal range of motion.  Neurological: She is alert and oriented to person, place, and time. Coordination normal.  Skin: Skin is warm and dry.   Filed Vitals:   09/29/14 1526  BP: 116/78  Pulse: 96  Temp: 98.6 F (37 C)  TempSrc: Oral  Resp: 18  Height: 5\' 6"  (1.676 m)  Weight:  260 lb 6.4 oz (118.117 kg)  SpO2: 93%      Assessment & Plan:  Flu shot given at today's visit.

## 2014-10-09 ENCOUNTER — Ambulatory Visit: Payer: Commercial Managed Care - HMO | Admitting: Internal Medicine

## 2015-01-14 ENCOUNTER — Other Ambulatory Visit: Payer: Self-pay | Admitting: Internal Medicine

## 2015-01-14 ENCOUNTER — Other Ambulatory Visit: Payer: Self-pay | Admitting: Physician Assistant

## 2015-02-07 ENCOUNTER — Other Ambulatory Visit: Payer: Self-pay | Admitting: Geriatric Medicine

## 2015-02-07 MED ORDER — LEVOTHYROXINE SODIUM 75 MCG PO TABS: 75.0000 ug | ORAL_TABLET | Freq: Every day | ORAL | Status: DC

## 2015-02-07 MED ORDER — OMEPRAZOLE 40 MG PO CPDR: 40.0000 mg | DELAYED_RELEASE_CAPSULE | Freq: Every day | ORAL | Status: DC

## 2015-02-07 MED ORDER — ATORVASTATIN CALCIUM 20 MG PO TABS: ORAL_TABLET | ORAL | Status: DC

## 2015-02-07 MED ORDER — SERTRALINE HCL 100 MG PO TABS: ORAL_TABLET | ORAL | Status: DC

## 2015-04-05 ENCOUNTER — Encounter: Payer: Self-pay | Admitting: Internal Medicine

## 2015-04-05 ENCOUNTER — Other Ambulatory Visit (INDEPENDENT_AMBULATORY_CARE_PROVIDER_SITE_OTHER): Payer: Commercial Managed Care - HMO

## 2015-04-05 ENCOUNTER — Ambulatory Visit (INDEPENDENT_AMBULATORY_CARE_PROVIDER_SITE_OTHER): Payer: Commercial Managed Care - HMO | Admitting: Internal Medicine

## 2015-04-05 VITALS — BP 102/72 | HR 93 | Temp 97.9°F | Resp 18 | Ht 66.0 in | Wt 239.0 lb

## 2015-04-05 DIAGNOSIS — Z0000 Encounter for general adult medical examination without abnormal findings: Secondary | ICD-10-CM | POA: Diagnosis not present

## 2015-04-05 DIAGNOSIS — E669 Obesity, unspecified: Secondary | ICD-10-CM | POA: Diagnosis not present

## 2015-04-05 LAB — COMPREHENSIVE METABOLIC PANEL
ALT: 15 U/L (ref 0–35)
AST: 18 U/L (ref 0–37)
Albumin: 4 g/dL (ref 3.5–5.2)
Alkaline Phosphatase: 90 U/L (ref 39–117)
BUN: 25 mg/dL — ABNORMAL HIGH (ref 6–23)
CO2: 27 mEq/L (ref 19–32)
Calcium: 9.4 mg/dL (ref 8.4–10.5)
Chloride: 105 mEq/L (ref 96–112)
Creatinine, Ser: 1.18 mg/dL (ref 0.40–1.20)
GFR: 48.56 mL/min — ABNORMAL LOW (ref >60.00)
Glucose, Bld: 92 mg/dL (ref 70–99)
Potassium: 4.3 mEq/L (ref 3.5–5.1)
Sodium: 138 mEq/L (ref 135–145)
Total Bilirubin: 0.8 mg/dL (ref 0.2–1.2)
Total Protein: 7.2 g/dL (ref 6.0–8.3)

## 2015-04-05 LAB — LIPID PANEL
Cholesterol: 191 mg/dL (ref 0–200)
HDL: 45.5 mg/dL (ref >39.00)
LDL Cholesterol: 111 mg/dL — ABNORMAL HIGH (ref 0–99)
NonHDL: 145.5
Total CHOL/HDL Ratio: 4
Triglycerides: 174 mg/dL — ABNORMAL HIGH (ref 0.0–149.0)
VLDL: 34.8 mg/dL (ref 0.0–40.0)

## 2015-04-05 LAB — CBC
HCT: 43 % (ref 36.0–46.0)
Hemoglobin: 14.9 g/dL (ref 12.0–15.0)
MCHC: 34.6 g/dL (ref 30.0–36.0)
MCV: 85.8 fl (ref 78.0–100.0)
Platelets: 197 10*3/uL (ref 150.0–400.0)
RBC: 5.01 Mil/uL (ref 3.87–5.11)
RDW: 15 % (ref 11.5–15.5)
WBC: 6.6 10*3/uL (ref 4.0–10.5)

## 2015-04-05 LAB — TSH: TSH: 3.65 u[IU]/mL (ref 0.35–4.50)

## 2015-04-05 NOTE — Patient Instructions (Signed)
Keep up the great work with your weight loss and maybe next time I see you, you will be under 200 pounds!  We will check the blood work today and call you back with the results.   Come back in about 6 months for a wellness visit.   Health Maintenance Adopting a healthy lifestyle and getting preventive care can go a long way to promote health and wellness. Talk with your health care provider about what schedule of regular examinations is right for you. This is a good chance for you to check in with your provider about disease prevention and staying healthy. In between checkups, there are plenty of things you can do on your own. Experts have done a lot of research about which lifestyle changes and preventive measures are most likely to keep you healthy. Ask your health care provider for more information. WEIGHT AND DIET  Eat a healthy diet  Be sure to include plenty of vegetables, fruits, low-fat dairy products, and lean protein.  Do not eat a lot of foods high in solid fats, added sugars, or salt.  Get regular exercise. This is one of the most important things you can do for your health.  Most adults should exercise for at least 150 minutes each week. The exercise should increase your heart rate and make you sweat (moderate-intensity exercise).  Most adults should also do strengthening exercises at least twice a week. This is in addition to the moderate-intensity exercise.  Maintain a healthy weight  Body mass index (BMI) is a measurement that can be used to identify possible weight problems. It estimates body fat based on height and weight. Your health care provider can help determine your BMI and help you achieve or maintain a healthy weight.  For females 20 years of age and older:   A BMI below 18.5 is considered underweight.  A BMI of 18.5 to 24.9 is normal.  A BMI of 25 to 29.9 is considered overweight.  A BMI of 30 and above is considered obese.  Watch levels of cholesterol  and blood lipids  You should start having your blood tested for lipids and cholesterol at 67 years of age, then have this test every 5 years.  You may need to have your cholesterol levels checked more often if:  Your lipid or cholesterol levels are high.  You are older than 67 years of age.  You are at high risk for heart disease.  CANCER SCREENING   Lung Cancer  Lung cancer screening is recommended for adults 64-9 years old who are at high risk for lung cancer because of a history of smoking.  A yearly low-dose CT scan of the lungs is recommended for people who:  Currently smoke.  Have quit within the past 15 years.  Have at least a 30-pack-year history of smoking. A pack year is smoking an average of one pack of cigarettes a day for 1 year.  Yearly screening should continue until it has been 15 years since you quit.  Yearly screening should stop if you develop a health problem that would prevent you from having lung cancer treatment.  Breast Cancer  Practice breast self-awareness. This means understanding how your breasts normally appear and feel.  It also means doing regular breast self-exams. Let your health care provider know about any changes, no matter how small.  If you are in your 20s or 30s, you should have a clinical breast exam (CBE) by a health care provider every 1-3 years  as part of a regular health exam.  If you are 34 or older, have a CBE every year. Also consider having a breast X-ray (mammogram) every year.  If you have a family history of breast cancer, talk to your health care provider about genetic screening.  If you are at high risk for breast cancer, talk to your health care provider about having an MRI and a mammogram every year.  Breast cancer gene (BRCA) assessment is recommended for women who have family members with BRCA-related cancers. BRCA-related cancers include:  Breast.  Ovarian.  Tubal.  Peritoneal cancers.  Results of the  assessment will determine the need for genetic counseling and BRCA1 and BRCA2 testing. Cervical Cancer Routine pelvic examinations to screen for cervical cancer are no longer recommended for nonpregnant women who are considered low risk for cancer of the pelvic organs (ovaries, uterus, and vagina) and who do not have symptoms. A pelvic examination may be necessary if you have symptoms including those associated with pelvic infections. Ask your health care provider if a screening pelvic exam is right for you.   The Pap test is the screening test for cervical cancer for women who are considered at risk.  If you had a hysterectomy for a problem that was not cancer or a condition that could lead to cancer, then you no longer need Pap tests.  If you are older than 65 years, and you have had normal Pap tests for the past 10 years, you no longer need to have Pap tests.  If you have had past treatment for cervical cancer or a condition that could lead to cancer, you need Pap tests and screening for cancer for at least 20 years after your treatment.  If you no longer get a Pap test, assess your risk factors if they change (such as having a new sexual partner). This can affect whether you should start being screened again.  Some women have medical problems that increase their chance of getting cervical cancer. If this is the case for you, your health care provider may recommend more frequent screening and Pap tests.  The human papillomavirus (HPV) test is another test that may be used for cervical cancer screening. The HPV test looks for the virus that can cause cell changes in the cervix. The cells collected during the Pap test can be tested for HPV.  The HPV test can be used to screen women 47 years of age and older. Getting tested for HPV can extend the interval between normal Pap tests from three to five years.  An HPV test also should be used to screen women of any age who have unclear Pap test  results.  After 67 years of age, women should have HPV testing as often as Pap tests.  Colorectal Cancer  This type of cancer can be detected and often prevented.  Routine colorectal cancer screening usually begins at 67 years of age and continues through 67 years of age.  Your health care provider may recommend screening at an earlier age if you have risk factors for colon cancer.  Your health care provider may also recommend using home test kits to check for hidden blood in the stool.  A small camera at the end of a tube can be used to examine your colon directly (sigmoidoscopy or colonoscopy). This is done to check for the earliest forms of colorectal cancer.  Routine screening usually begins at age 40.  Direct examination of the colon should be repeated every  5-10 years through 67 years of age. However, you may need to be screened more often if early forms of precancerous polyps or small growths are found. Skin Cancer  Check your skin from head to toe regularly.  Tell your health care provider about any new moles or changes in moles, especially if there is a change in a mole's shape or color.  Also tell your health care provider if you have a mole that is larger than the size of a pencil eraser.  Always use sunscreen. Apply sunscreen liberally and repeatedly throughout the day.  Protect yourself by wearing long sleeves, pants, a wide-brimmed hat, and sunglasses whenever you are outside. HEART DISEASE, DIABETES, AND HIGH BLOOD PRESSURE   Have your blood pressure checked at least every 1-2 years. High blood pressure causes heart disease and increases the risk of stroke.  If you are between 67 years and 57 years old, ask your health care provider if you should take aspirin to prevent strokes.  Have regular diabetes screenings. This involves taking a blood sample to check your fasting blood sugar level.  If you are at a normal weight and have a low risk for diabetes, have this  test once every three years after 67 years of age.  If you are overweight and have a high risk for diabetes, consider being tested at a younger age or more often. PREVENTING INFECTION  Hepatitis B  If you have a higher risk for hepatitis B, you should be screened for this virus. You are considered at high risk for hepatitis B if:  You were born in a country where hepatitis B is common. Ask your health care provider which countries are considered high risk.  Your parents were born in a high-risk country, and you have not been immunized against hepatitis B (hepatitis B vaccine).  You have HIV or AIDS.  You use needles to inject street drugs.  You live with someone who has hepatitis B.  You have had sex with someone who has hepatitis B.  You get hemodialysis treatment.  You take certain medicines for conditions, including cancer, organ transplantation, and autoimmune conditions. Hepatitis C  Blood testing is recommended for:  Everyone born from [birth year] through 1963.  Anyone with known risk factors for hepatitis C. Sexually transmitted infections (STIs)  You should be screened for sexually transmitted infections (STIs) including gonorrhea and chlamydia if:  You are sexually active and are younger than 67 years of age.  You are older than 67 years of age and your health care provider tells you that you are at risk for this type of infection.  Your sexual activity has changed since you were last screened and you are at an increased risk for chlamydia or gonorrhea. Ask your health care provider if you are at risk.  If you do not have HIV, but are at risk, it may be recommended that you take a prescription medicine daily to prevent HIV infection. This is called pre-exposure prophylaxis (PrEP). You are considered at risk if:  You are sexually active and do not regularly use condoms or know the HIV status of your partner(s).  You take drugs by injection.  You are sexually active with  a partner who has HIV. Talk with your health care provider about whether you are at high risk of being infected with HIV. If you choose to begin PrEP, you should first be tested for HIV. You should then be tested every 3 months for as long as you  are taking PrEP.  PREGNANCY   If you are premenopausal and you may become pregnant, ask your health care provider about preconception counseling.  If you may become pregnant, take 400 to 800 micrograms (mcg) of folic acid every day.  If you want to prevent pregnancy, talk to your health care provider about birth control (contraception). OSTEOPOROSIS AND MENOPAUSE   Osteoporosis is a disease in which the bones lose minerals and strength with aging. This can result in serious bone fractures. Your risk for osteoporosis can be identified using a bone density scan.  If you are 80 years of age or older, or if you are at risk for osteoporosis and fractures, ask your health care provider if you should be screened.  Ask your health care provider whether you should take a calcium or vitamin D supplement to lower your risk for osteoporosis.  Menopause may have certain physical symptoms and risks.  Hormone replacement therapy may reduce some of these symptoms and risks. Talk to your health care provider about whether hormone replacement therapy is right for you.  HOME CARE INSTRUCTIONS   Schedule regular health, dental, and eye exams.  Stay current with your immunizations.   Do not use any tobacco products including cigarettes, chewing tobacco, or electronic cigarettes.  If you are pregnant, do not drink alcohol.  If you are breastfeeding, limit how much and how often you drink alcohol.  Limit alcohol intake to no more than 1 drink per day for nonpregnant women. One drink equals 12 ounces of beer, 5 ounces of wine, or 1 ounces of hard liquor.  Do not use street drugs.  Do not share needles.  Ask your health care provider for help if you need  support or information about quitting drugs.  Tell your health care provider if you often feel depressed.  Tell your health care provider if you have ever been abused or do not feel safe at home. Document Released: 06/23/2011 Document Revised: 04/24/2014 Document Reviewed: 11/09/2013 [facility] Patient Information 2015 [place], [place]. This information is not intended to replace advice given to you by your health care provider. Make sure you discuss any questions you have with your health care provider.

## 2015-04-05 NOTE — Progress Notes (Signed)
Pre visit review using our clinic review tool, if applicable. No additional management support is needed unless otherwise documented below in the visit note. 

## 2015-04-07 NOTE — Assessment & Plan Note (Signed)
Have changed her diagnosis from morbid obesity to just obesity since her BMI is now less than 40. Congratulated her on the weight loss. She will continue with the hard work of diet and exercise everyday. Her goal is to be 180 and I talked with her about trying to be around or less than 200 at follow up in 6 months. She does need monitoring periodically to keep her motivated and on track.

## 2015-04-07 NOTE — Progress Notes (Signed)
   Subjective:    Patient ID: Joanne Klein, female    DOB: 02-16-1946, 69 y.o.   MRN: [id number]  HPI The patient is a 69 YO female who is here to follow up on her morbid obesity. She has been doing really well with changes to her diet and increasing her exercise. She has lost almost 30 pounds since last visit. She feels a lot better and having less pain in her joints. She would still like to lose more weight and her goal weigh is 180 pounds. No new complaints.   Review of Systems  Constitutional: Negative for fever, activity change, appetite change and fatigue.  Respiratory: Negative for cough, chest tightness, shortness of breath and wheezing.   Cardiovascular: Negative for chest pain, palpitations and leg swelling.  Gastrointestinal: Positive for constipation. Negative for abdominal pain, diarrhea and abdominal distention.       Chronic issue  Musculoskeletal: Positive for arthralgias. Negative for gait problem.  Neurological: Negative.   Psychiatric/Behavioral: Positive for sleep disturbance.      Objective:   Physical Exam  Constitutional: She is oriented to person, place, and time. She appears well-developed and well-nourished.  Obese  HENT:  Head: Normocephalic and atraumatic.  Eyes: EOM are normal.  Neck: Normal range of motion.  Cardiovascular: Normal rate and regular rhythm.   Pulmonary/Chest: Effort normal and breath sounds normal.  Abdominal: Soft. Bowel sounds are normal. She exhibits no distension. There is no tenderness. There is no rebound.  Musculoskeletal: Normal range of motion.  Neurological: She is alert and oriented to person, place, and time. Coordination normal.  Skin: Skin is warm and dry.   Filed Vitals:   04/05/15 1622  BP: 102/72  Pulse: 93  Temp: 97.9 F (36.6 C)  TempSrc: Oral  Resp: 18  Height: 5\' 6"  (1.676 m)  Weight: 239 lb (108.41 kg)  SpO2: 99%      Assessment & Plan:

## 2015-05-07 ENCOUNTER — Other Ambulatory Visit: Payer: Self-pay | Admitting: Physician Assistant

## 2015-06-07 ENCOUNTER — Telehealth: Payer: Self-pay

## 2015-06-07 NOTE — Telephone Encounter (Signed)
Patient called to educate on Medicare Wellness apt. LVM for the patient to call back to educate and schedule for wellness visit.   

## 2015-06-08 ENCOUNTER — Telehealth: Payer: Self-pay

## 2015-06-08 NOTE — Telephone Encounter (Signed)
Follow up call to discuss the AWV; the patient agreed to come in early (2:45) for her AWV on 10/13/ at 3:30 pm

## 2015-06-12 ENCOUNTER — Telehealth: Payer: Self-pay

## 2015-06-12 NOTE — Telephone Encounter (Signed)
Call to fup on AWV; sister answered and stated she was having back pain but she would give her the message. STated she thought she had wellness; reviewed the billing did not indicate wellness and educated regarding visit; but an apt can be made at her convenience or she can call [phone] and speak to [place]

## 2015-10-04 ENCOUNTER — Other Ambulatory Visit (INDEPENDENT_AMBULATORY_CARE_PROVIDER_SITE_OTHER): Payer: Commercial Managed Care - HMO

## 2015-10-04 ENCOUNTER — Encounter: Payer: Self-pay | Admitting: Internal Medicine

## 2015-10-04 ENCOUNTER — Ambulatory Visit (INDEPENDENT_AMBULATORY_CARE_PROVIDER_SITE_OTHER): Payer: Commercial Managed Care - HMO | Admitting: Internal Medicine

## 2015-10-04 VITALS — BP 96/66 | HR 99 | Temp 98.0°F | Ht 66.0 in | Wt 247.2 lb

## 2015-10-04 DIAGNOSIS — Z23 Encounter for immunization: Secondary | ICD-10-CM

## 2015-10-04 DIAGNOSIS — Z0000 Encounter for general adult medical examination without abnormal findings: Secondary | ICD-10-CM

## 2015-10-04 DIAGNOSIS — E039 Hypothyroidism, unspecified: Secondary | ICD-10-CM

## 2015-10-04 DIAGNOSIS — E785 Hyperlipidemia, unspecified: Secondary | ICD-10-CM | POA: Diagnosis not present

## 2015-10-04 LAB — COMPREHENSIVE METABOLIC PANEL
ALT: 10 U/L (ref 0–35)
AST: 13 U/L (ref 0–37)
Albumin: 4.2 g/dL (ref 3.5–5.2)
Alkaline Phosphatase: 100 U/L (ref 39–117)
BUN: 31 mg/dL — ABNORMAL HIGH (ref 6–23)
CO2: 29 mEq/L (ref 19–32)
Calcium: 9.2 mg/dL (ref 8.4–10.5)
Chloride: 106 mEq/L (ref 96–112)
Creatinine, Ser: 1.15 mg/dL (ref 0.40–1.20)
GFR: 49.95 mL/min — ABNORMAL LOW (ref >60.00)
Glucose, Bld: 86 mg/dL (ref 70–99)
Potassium: 5.4 mEq/L — ABNORMAL HIGH (ref 3.5–5.1)
Sodium: 141 mEq/L (ref 135–145)
Total Bilirubin: 0.7 mg/dL (ref 0.2–1.2)
Total Protein: 7.2 g/dL (ref 6.0–8.3)

## 2015-10-04 LAB — T4, FREE: Free T4: 0.81 ng/dL (ref 0.60–1.60)

## 2015-10-04 LAB — HEMOGLOBIN A1C: Hgb A1c MFr Bld: 5.4 % (ref 4.6–6.5)

## 2015-10-04 LAB — TSH: TSH: 2.36 u[IU]/mL (ref 0.35–4.50)

## 2015-10-04 MED ORDER — OXYCODONE-ACETAMINOPHEN 10-325 MG PO TABS: 1.0000 | ORAL_TABLET | ORAL | Status: DC | PRN

## 2015-10-04 NOTE — Patient Instructions (Signed)
Joanne Klein , Thank you for taking time to come for your Medicare Wellness Visit. I appreciate your ongoing commitment to your health goals. Please review the following plan we discussed and let me know if I can assist you in the future.   These are the goals we discussed: Goals    None      This is a list of the screening recommended for you and due dates:  Health Maintenance  Topic Date Due  .  Hepatitis C: One time screening is recommended by Center for Disease Control  (CDC) for  adults born from [birth year] through 1963.   1946/03/30  . Shingles Vaccine  03/29/2008  . DEXA scan (bone density measurement)  03/29/2013  . Tetanus Vaccine  01/23/2015  . Pneumonia vaccines (2 of 2 - PCV13) 04/18/2015  . Mammogram  04/22/2015  . Flu Shot  07/23/2015  . Colon Cancer Screening  03/22/2019   Health Maintenance, Female Adopting a healthy lifestyle and getting preventive care can go a long way to promote health and wellness. Talk with your health care provider about what schedule of regular examinations is right for you. This is a good chance for you to check in with your provider about disease prevention and staying healthy. In between checkups, there are plenty of things you can do on your own. Experts have done a lot of research about which lifestyle changes and preventive measures are most likely to keep you healthy. Ask your health care provider for more information. WEIGHT AND DIET  Eat a healthy diet  Be sure to include plenty of vegetables, fruits, low-fat dairy products, and lean protein.  Do not eat a lot of foods high in solid fats, added sugars, or salt.  Get regular exercise. This is one of the most important things you can do for your health.  Most adults should exercise for at least 150 minutes each week. The exercise should increase your heart rate and make you sweat (moderate-intensity exercise).  Most adults should also do strengthening exercises at least twice a week. This is  in addition to the moderate-intensity exercise.  Maintain a healthy weight  Body mass index (BMI) is a measurement that can be used to identify possible weight problems. It estimates body fat based on height and weight. Your health care provider can help determine your BMI and help you achieve or maintain a healthy weight.  For females 75 years of age and older:   A BMI below 18.5 is considered underweight.  A BMI of 18.5 to 24.9 is normal.  A BMI of 25 to 29.9 is considered overweight.  A BMI of 30 and above is considered obese.  Watch levels of cholesterol and blood lipids  You should start having your blood tested for lipids and cholesterol at 67 years of age, then have this test every 5 years.  You may need to have your cholesterol levels checked more often if:  Your lipid or cholesterol levels are high.  You are older than 67 years of age.  You are at high risk for heart disease.  CANCER SCREENING   Lung Cancer  Lung cancer screening is recommended for adults 49-26 years old who are at high risk for lung cancer because of a history of smoking.  A yearly low-dose CT scan of the lungs is recommended for people who:  Currently smoke.  Have quit within the past 15 years.  Have at least a 30-pack-year history of smoking. A pack year is  smoking an average of one pack of cigarettes a day for 1 year.  Yearly screening should continue until it has been 15 years since you quit.  Yearly screening should stop if you develop a health problem that would prevent you from having lung cancer treatment.  Breast Cancer  Practice breast self-awareness. This means understanding how your breasts normally appear and feel.  It also means doing regular breast self-exams. Let your health care provider know about any changes, no matter how small.  If you are in your 20s or 30s, you should have a clinical breast exam (CBE) by a health care provider every 1-3 years as part of a regular  health exam.  If you are 69 or older, have a CBE every year. Also consider having a breast X-ray (mammogram) every year.  If you have a family history of breast cancer, talk to your health care provider about genetic screening.  If you are at high risk for breast cancer, talk to your health care provider about having an MRI and a mammogram every year.  Breast cancer gene (BRCA) assessment is recommended for women who have family members with BRCA-related cancers. BRCA-related cancers include:  Breast.  Ovarian.  Tubal.  Peritoneal cancers.  Results of the assessment will determine the need for genetic counseling and BRCA1 and BRCA2 testing. Cervical Cancer Your health care provider may recommend that you be screened regularly for cancer of the pelvic organs (ovaries, uterus, and vagina). This screening involves a pelvic examination, including checking for microscopic changes to the surface of your cervix (Pap test). You may be encouraged to have this screening done every 3 years, beginning at age 14.  For women ages 76-65, health care providers may recommend pelvic exams and Pap testing every 3 years, or they may recommend the Pap and pelvic exam, combined with testing for human papilloma virus (HPV), every 5 years. Some types of HPV increase your risk of cervical cancer. Testing for HPV may also be done on women of any age with unclear Pap test results.  Other health care providers may not recommend any screening for nonpregnant women who are considered low risk for pelvic cancer and who do not have symptoms. Ask your health care provider if a screening pelvic exam is right for you.  If you have had past treatment for cervical cancer or a condition that could lead to cancer, you need Pap tests and screening for cancer for at least 20 years after your treatment. If Pap tests have been discontinued, your risk factors (such as having a new sexual partner) need to be reassessed to determine if  screening should resume. Some women have medical problems that increase the chance of getting cervical cancer. In these cases, your health care provider may recommend more frequent screening and Pap tests. Colorectal Cancer  This type of cancer can be detected and often prevented.  Routine colorectal cancer screening usually begins at 67 years of age and continues through 67 years of age.  Your health care provider may recommend screening at an earlier age if you have risk factors for colon cancer.  Your health care provider may also recommend using home test kits to check for hidden blood in the stool.  A small camera at the end of a tube can be used to examine your colon directly (sigmoidoscopy or colonoscopy). This is done to check for the earliest forms of colorectal cancer.  Routine screening usually begins at age 63.  Direct examination of the  colon should be repeated every 5-10 years through 67 years of age. However, you may need to be screened more often if early forms of precancerous polyps or small growths are found. Skin Cancer  Check your skin from head to toe regularly.  Tell your health care provider about any new moles or changes in moles, especially if there is a change in a mole's shape or color.  Also tell your health care provider if you have a mole that is larger than the size of a pencil eraser.  Always use sunscreen. Apply sunscreen liberally and repeatedly throughout the day.  Protect yourself by wearing long sleeves, pants, a wide-brimmed hat, and sunglasses whenever you are outside. HEART DISEASE, DIABETES, AND HIGH BLOOD PRESSURE   High blood pressure causes heart disease and increases the risk of stroke. High blood pressure is more likely to develop in:  People who have blood pressure in the high end of the normal range (130-139/85-89 mm Hg).  People who are overweight or obese.  People who are African American.  If you are 21-64 years of age, have your  blood pressure checked every 3-5 years. If you are 24 years of age or older, have your blood pressure checked every year. You should have your blood pressure measured twice--once when you are at a hospital or clinic, and once when you are not at a hospital or clinic. Record the average of the two measurements. To check your blood pressure when you are not at a hospital or clinic, you can use:  An automated blood pressure machine at a pharmacy.  A home blood pressure monitor.  If you are between 58 years and 3 years old, ask your health care provider if you should take aspirin to prevent strokes.  Have regular diabetes screenings. This involves taking a blood sample to check your fasting blood sugar level.  If you are at a normal weight and have a low risk for diabetes, have this test once every three years after 67 years of age.  If you are overweight and have a high risk for diabetes, consider being tested at a younger age or more often. PREVENTING INFECTION  Hepatitis B  If you have a higher risk for hepatitis B, you should be screened for this virus. You are considered at high risk for hepatitis B if:  You were born in a country where hepatitis B is common. Ask your health care provider which countries are considered high risk.  Your parents were born in a high-risk country, and you have not been immunized against hepatitis B (hepatitis B vaccine).  You have HIV or AIDS.  You use needles to inject street drugs.  You live with someone who has hepatitis B.  You have had sex with someone who has hepatitis B.  You get hemodialysis treatment.  You take certain medicines for conditions, including cancer, organ transplantation, and autoimmune conditions. Hepatitis C  Blood testing is recommended for:  Everyone born from [birth year] through 1963.  Anyone with known risk factors for hepatitis C. Sexually transmitted infections (STIs)  You should be screened for sexually transmitted  infections (STIs) including gonorrhea and chlamydia if:  You are sexually active and are younger than 67 years of age.  You are older than 67 years of age and your health care provider tells you that you are at risk for this type of infection.  Your sexual activity has changed since you were last screened and you are at an increased risk  for chlamydia or gonorrhea. Ask your health care provider if you are at risk.  If you do not have HIV, but are at risk, it may be recommended that you take a prescription medicine daily to prevent HIV infection. This is called pre-exposure prophylaxis (PrEP). You are considered at risk if:  You are sexually active and do not regularly use condoms or know the HIV status of your partner(s).  You take drugs by injection.  You are sexually active with a partner who has HIV. Talk with your health care provider about whether you are at high risk of being infected with HIV. If you choose to begin PrEP, you should first be tested for HIV. You should then be tested every 3 months for as long as you are taking PrEP.  PREGNANCY   If you are premenopausal and you may become pregnant, ask your health care provider about preconception counseling.  If you may become pregnant, take 400 to 800 micrograms (mcg) of folic acid every day.  If you want to prevent pregnancy, talk to your health care provider about birth control (contraception). OSTEOPOROSIS AND MENOPAUSE   Osteoporosis is a disease in which the bones lose minerals and strength with aging. This can result in serious bone fractures. Your risk for osteoporosis can be identified using a bone density scan.  If you are 24 years of age or older, or if you are at risk for osteoporosis and fractures, ask your health care provider if you should be screened.  Ask your health care provider whether you should take a calcium or vitamin D supplement to lower your risk for osteoporosis.  Menopause may have certain physical  symptoms and risks.  Hormone replacement therapy may reduce some of these symptoms and risks. Talk to your health care provider about whether hormone replacement therapy is right for you.  HOME CARE INSTRUCTIONS   Schedule regular health, dental, and eye exams.  Stay current with your immunizations.   Do not use any tobacco products including cigarettes, chewing tobacco, or electronic cigarettes.  If you are pregnant, do not drink alcohol.  If you are breastfeeding, limit how much and how often you drink alcohol.  Limit alcohol intake to no more than 1 drink per day for nonpregnant women. One drink equals 12 ounces of beer, 5 ounces of wine, or 1 ounces of hard liquor.  Do not use street drugs.  Do not share needles.  Ask your health care provider for help if you need support or information about quitting drugs.  Tell your health care provider if you often feel depressed.  Tell your health care provider if you have ever been abused or do not feel safe at home.   This information is not intended to replace advice given to you by your health care provider. Make sure you discuss any questions you have with your health care provider.   Document Released: 06/23/2011 Document Revised: 12/29/2014 Document Reviewed: 11/09/2013 Elsevier Interactive Patient Education [employer].

## 2015-10-04 NOTE — Progress Notes (Signed)
Subjective:   Joanne Klein is a 69 y.o. female who presents for Medicare Annual (Subsequent) preventive examination.  Review of Systems: per CPE HRA assessment completed during visit;  The Patient was informed that this wellness visit is to identify risk and educate on how to reduce risk for increase disease through lifestyle changes.   ROS deferred to CPE exam with physician  Medical issues  COPD gold II; Was seen by Dr. Melvyn Novas 06/2014; not addressed today Jerrye Bushy; Depression and hyperlipidemia; obesity (4/16; cho 191; Trig 174; HDL 45; LDL 34) A1c 5.8; all discussed   BMI: (hx obesity and weight loss; Oct 2016 wt 260 and April 239) Describes family life changes and this is secondary to weight plateau BMI today 39.9 states she quit smoking; divorced and went on anti-depressant and gained weight; Has been losing weight x 2 years;   Sister recently moved back to [place] x 65 yo with dog and lives with the patient now;  Her sister  is currently rehabbing post fall; which has interrupted the lifestyle and goals for weight loss; Stopped smoking 97; 19 years and is thinking about smoking again to manage stressors; States the nicotine gives her energy;  Asked about coping with obsession to smoke;  Admits to feeling irritable; overwhelmed with things to do at home but is tired; Has hired some help to come in and assist with housekeeping Low energy and overwhelmed to get things done;  Stopped dextroamphetamine for energy; oxycodone - acetaminophen q 4 hours for fibro and other;  Can't see pain doctor until January so  she can go to the one on plan;  Long term hx of depression; loss of dog on Dec 25 x 2 yo Recommended the patient reconsider Psych to manage meds and assist with energy  Has contemplated yoga; mindfulness; when sister fell and broke her hip; Will consider as she feels this may be helpful. Denies depression; discussed Psych in [place] that does some yoga for depression etc and this is  something she is interested in.  Diet; sleeping more; misses her privacy; has OCD and she is not neat Many psychosocial issues as well as adjusting to live in sister with needs;    Exercise; deferred due to low energy   SAFETY/deferred to to focus on stressors and options. Reports one fall but no injury   Medication review/   Mobilization and Functional losses in the last year. Constipation due to pain meds      Counseling: Hep C discussed and declined Colonoscopy; 02/2014/ had one this past year; Dr. Deatra Ina; polyps;  EKG: no EKG Dexa scan; unknown/ declined Mammogram 04/2013; declined  Hearing: 4000hz  in right/ better on left  Ophthalmology exam; just had eye exam/ no glaucoma;   Immunizations Due: Zostavax talk to Dr Sharlet Salina;  tetanus due 01/2015;will check at home to be sure she has not had it PCV 13 - today Flu-shot today    Current Care Team reviewed and updated Cardiac Risk Factors include: advanced age (>26men, >23 women);obesity (BMI >30kg/m2)     Objective:     Vitals: BP 96/66 mmHg  Pulse 99  Temp(Src) 98 F (36.7 C) (Oral)  Ht 5\' 6"  (1.676 m)  Wt 247 lb 4 oz (112.152 kg)  BMI 39.93 kg/m2  SpO2 95%  Tobacco History  Smoking status  . Former Smoker -- 2.00 packs/day for 34 years  . Types: Cigarettes  . Quit date: 12/15/1996  Smokeless tobacco  . Never Used  Counseling given: Yes   Past Medical History  Diagnosis Date  . Pure hypercholesterolemia   . Pain in joint, site unspecified   . Obesity, unspecified   . Irritable bowel syndrome   . Situational stress   . Hypothyroidism   . Diverticulitis   . Obstructive sleep apnea   . Arthritis   . Allergic rhinitis   . Scoliosis   . History of fibromyalgia   . Hypertension   . Chronic low back pain   . Neuralgia, post-herpetic   . Emphysema of lung ([place])   . Bursitis     Both Shoulders  . OCD (obsessive compulsive disorder)   . Depression   . GERD (gastroesophageal reflux  disease)   . Spastic colon   . Degenerative disk disease    Past Surgical History  Procedure Laterality Date  . Cholecystectomy    . Breast lumpectomy      knot removed  . Tonsillectomy    . Tumor in left forearm    . Hysterotomy     Family History  Problem Relation Age of Onset  . Stomach cancer Father   . Liver cancer Father   . Diabetes Father   . Colon cancer Neg Hx    History  Sexual Activity  . Sexual Activity: Not on file    Outpatient Encounter Prescriptions as of 10/04/2015  Medication Sig  . atorvastatin (LIPITOR) 20 MG tablet TAKE 1 TABLET EVERY DAY  OR  AS  INSTRUCTED  . Cholecalciferol (VITAMIN D-3) 1000 UNITS CAPS Take by mouth.  . fluocinonide cream (LIDEX) 0.05 % Apply topically as needed.  Marland Kitchen levothyroxine (SYNTHROID, LEVOTHROID) 75 MCG tablet Take 1 tablet (75 mcg total) by mouth daily.  Marland Kitchen omeprazole (PRILOSEC) 40 MG capsule Take 1 capsule (40 mg total) by mouth daily.  Marland Kitchen oxyCODONE-acetaminophen (PERCOCET) 10-325 MG tablet Take 1 tablet by mouth every 4 (four) hours as needed.  . sertraline (ZOLOFT) 100 MG tablet Take two tablets daily at bedtime  . [DISCONTINUED] oxyCODONE-acetaminophen (PERCOCET) 10-325 MG per tablet Take 1 tablet by mouth every 4 (four) hours as needed.   Marland Kitchen albuterol (PROVENTIL HFA;VENTOLIN HFA) 108 (90 BASE) MCG/ACT inhaler Inhale 2 puffs into the lungs every 6 (six) hours as needed for wheezing or shortness of breath. (Patient not taking: Reported on 04/05/2015)  . dextroamphetamine (DEXEDRINE SPANSULE) 15 MG 24 hr capsule Take 1 capsule (15 mg total) by mouth 2 (two) times daily. (Patient not taking: Reported on 04/05/2015)   No facility-administered encounter medications on file as of 10/04/2015.    Activities of Daily Living In your present state of health, do you have any difficulty performing the following activities: 10/04/2015  Hearing? N  Vision? N  Difficulty concentrating or making decisions? N  Walking or climbing stairs? N   Dressing or bathing? N  Doing errands, shopping? N  Preparing Food and eating ? N  Using the Toilet? N  In the past six months, have you accidently leaked urine? N  Do you have problems with loss of bowel control? Y  Managing your Medications? N  Managing your Finances? N  Housekeeping or managing your Housekeeping? Y    Patient Care Team: Hoyt Koch, MD as PCP - General (Internal Medicine) Inda Castle, MD (Gastroenterology) Nicholaus Bloom, MD (Anesthesiology)    Assessment:    Assessment   Patient presents for yearly preventative medicine examination. Medicare questionnaire screening were completed, i.e. Functional; fall risk; depression, memory loss and hearing.  Suggested psych  for medication management; may need to find new psych doctor which will rx meds and work on counseling  All immunizations and health maintenance protocols were reviewed with the patient and needed orders were placed.  Education provided for laboratory screens;    Medication reconciliation, past medical history, social history, problem list and allergies were reviewed in detail with the patient  Goals were re established with regard to weight loss,    Exercise Activities and Dietary recommendations Current Exercise Habits:: Exercise is limited by, Limited by:: psychological condition(s)  Goals    . patient     Will explore mindfulness; and yoga Dr. Truitt Leep;  Address: [address], [place], [place] [address]  Phone: [phone]       Fall Risk Fall Risk  10/04/2015  Falls in the past year? Yes  Number falls in past yr: 1  Follow up Education provided   Depression Screen PHQ 2/9 Scores 10/04/2015  PHQ - 2 Score 0    Denies depression;  Cognitive Testing MMSE - Mini Mental State Exam 10/04/2015  Not completed: (No Data)   Denies memory issues Immunization History  Administered Date(s) Administered  . Influenza,inj,Quad PF,36+ Mos 09/29/2014  .  Influenza-Unspecified 09/15/2011, 09/22/2013  . Pneumococcal Polysaccharide-23 04/17/2014  . Td 01/23/2005   Screening Tests Health Maintenance  Topic Date Due  . Hepatitis C Screening  September 05, 1946  . ZOSTAVAX  03/29/2008  . DEXA SCAN  03/29/2013  . TETANUS/TDAP  01/23/2015  . PNA vac Low Risk Adult (2 of 2 - PCV13) 04/18/2015  . MAMMOGRAM  04/22/2015  . INFLUENZA VACCINE  07/23/2015  . COLONOSCOPY  03/22/2019      Plan:   During the course of the visit the patient was educated and counseled about the following appropriate screening and preventive services:   Vaccines to include Pneumoccal, Influenza, Hepatitis B, Td, Zostavax, HCV/ Gave flu and prevnar  Electrocardiogram no  Cardiovascular Disease/ deferred to md  Colorectal cancer screening/ 03/.2015  Bone density screening declines  Diabetes screening/ n/a  Glaucoma screening/ recent eye exam ok  Mammography/PAP declines  Nutrition counseling / reviewed  Patient Instructions (the written plan) was given to the patient.   Wynetta Fines, RN  10/04/2015

## 2015-10-05 LAB — HEPATITIS C ANTIBODY: HCV Ab: NEGATIVE

## 2015-10-07 ENCOUNTER — Encounter: Payer: Self-pay | Admitting: Internal Medicine

## 2015-10-07 NOTE — Progress Notes (Signed)
   Subjective:    Patient ID: Joanne Klein, female    DOB: [birth year], 69 y.o.   MRN: [id number]  HPI The patient is a 69 YO female coming in for follow up of her thyroid. She is feeling slightly more tired lately and is concerned about the levels. She has been struggling with this for years. No dose change recently. No constipation or tremors or diarrhea. Takes her medication daily about the same time.   Review of Systems  Constitutional: Negative for fever, activity change, appetite change and fatigue.  Respiratory: Negative for cough, chest tightness, shortness of breath and wheezing.   Cardiovascular: Negative for chest pain, palpitations and leg swelling.  Gastrointestinal: Negative for abdominal pain, diarrhea, constipation and abdominal distention.  Musculoskeletal: Positive for arthralgias. Negative for gait problem.  Neurological: Negative.   Psychiatric/Behavioral: Positive for sleep disturbance.      Objective:   Physical Exam  Constitutional: She is oriented to person, place, and time. She appears well-developed and well-nourished.  Obese  HENT:  Head: Normocephalic and atraumatic.  Eyes: EOM are normal.  Neck: Normal range of motion.  Cardiovascular: Normal rate and regular rhythm.   Pulmonary/Chest: Effort normal and breath sounds normal.  Abdominal: Soft. Bowel sounds are normal. She exhibits no distension. There is no tenderness. There is no rebound.  Musculoskeletal: Normal range of motion.  Neurological: She is alert and oriented to person, place, and time. Coordination normal.  Skin: Skin is warm and dry.   Filed Vitals:   10/04/15 1445  BP: 96/66  Pulse: 99  Temp: 98 F (36.7 C)  TempSrc: Oral  Height: 5\' 6"  (1.676 m)  Weight: 247 lb 4 oz (112.152 kg)  SpO2: 95%      Assessment & Plan:  Prevnar 13 and flu shot given at visit.

## 2015-10-07 NOTE — Progress Notes (Signed)
Medical screening examination/treatment/procedure(s) were performed by non-physician practitioner and as supervising physician I was immediately available for consultation/collaboration. I agree with above. Julieanna Geraci A Pamla Pangle, MD 

## 2015-10-07 NOTE — Assessment & Plan Note (Signed)
Checking TSH and free T4 and adjust as needed. Currently on synthroid 75 mcg daily.

## 2015-10-07 NOTE — Assessment & Plan Note (Signed)
Checking lipid panel and adjust as needed. Previously controlled on lipitor 20 mg daily without side effects.

## 2015-10-08 ENCOUNTER — Other Ambulatory Visit: Payer: Self-pay | Admitting: Internal Medicine

## 2015-10-08 DIAGNOSIS — E875 Hyperkalemia: Secondary | ICD-10-CM

## 2015-10-12 ENCOUNTER — Telehealth: Payer: Self-pay

## 2015-10-12 NOTE — Telephone Encounter (Signed)
error 

## 2015-10-19 ENCOUNTER — Other Ambulatory Visit (INDEPENDENT_AMBULATORY_CARE_PROVIDER_SITE_OTHER): Payer: Commercial Managed Care - HMO

## 2015-10-19 DIAGNOSIS — E875 Hyperkalemia: Secondary | ICD-10-CM

## 2015-10-19 LAB — BASIC METABOLIC PANEL
BUN: 31 mg/dL — ABNORMAL HIGH (ref 6–23)
CO2: 28 mEq/L (ref 19–32)
Calcium: 9.4 mg/dL (ref 8.4–10.5)
Chloride: 106 mEq/L (ref 96–112)
Creatinine, Ser: 1.09 mg/dL (ref 0.40–1.20)
GFR: 53.13 mL/min — ABNORMAL LOW (ref >60.00)
Glucose, Bld: 96 mg/dL (ref 70–99)
Potassium: 4.5 mEq/L (ref 3.5–5.1)
Sodium: 142 mEq/L (ref 135–145)

## 2015-11-02 ENCOUNTER — Other Ambulatory Visit: Payer: Self-pay | Admitting: Internal Medicine

## 2015-11-02 MED ORDER — OXYCODONE-ACETAMINOPHEN 10-325 MG PO TABS: 1.0000 | ORAL_TABLET | ORAL | Status: DC | PRN

## 2015-11-30 ENCOUNTER — Encounter: Payer: Self-pay | Admitting: Internal Medicine

## 2015-12-13 ENCOUNTER — Other Ambulatory Visit: Payer: Self-pay | Admitting: Geriatric Medicine

## 2015-12-13 MED ORDER — FLUOCINONIDE 0.05 % EX CREA: TOPICAL_CREAM | CUTANEOUS | Status: DC | PRN

## 2016-01-03 ENCOUNTER — Telehealth: Payer: Self-pay | Admitting: Internal Medicine

## 2016-01-03 NOTE — Telephone Encounter (Signed)
States she sent a fax over on 1/11 to Dr. Sharlet Salina.  Would like a call back to confirm it was received .

## 2016-01-04 ENCOUNTER — Other Ambulatory Visit: Payer: Self-pay | Admitting: Geriatric Medicine

## 2016-01-04 NOTE — Telephone Encounter (Signed)
Patient needs all new cpap supplies. She said if we fax an order that says "cpap supplies, " she said that will work. Fax it to [place], [phone], attn customer service.

## 2016-01-08 ENCOUNTER — Telehealth: Payer: Self-pay | Admitting: Internal Medicine

## 2016-01-08 NOTE — Telephone Encounter (Signed)
Rx given to you, please fax.

## 2016-01-08 NOTE — Telephone Encounter (Signed)
Patient called and advised that [facility] has not received the CPAP supply scripts. Please refax to # [phone]

## 2016-01-08 NOTE — Telephone Encounter (Signed)
Faxed CPAP supplies to [facility]

## 2016-01-10 DIAGNOSIS — G894 Chronic pain syndrome: Secondary | ICD-10-CM | POA: Diagnosis not present

## 2016-01-10 DIAGNOSIS — M797 Fibromyalgia: Secondary | ICD-10-CM | POA: Diagnosis not present

## 2016-01-10 DIAGNOSIS — Z79891 Long term (current) use of opiate analgesic: Secondary | ICD-10-CM | POA: Diagnosis not present

## 2016-01-10 DIAGNOSIS — G4733 Obstructive sleep apnea (adult) (pediatric): Secondary | ICD-10-CM | POA: Diagnosis not present

## 2016-01-10 DIAGNOSIS — M47812 Spondylosis without myelopathy or radiculopathy, cervical region: Secondary | ICD-10-CM | POA: Diagnosis not present

## 2016-01-16 DIAGNOSIS — G4733 Obstructive sleep apnea (adult) (pediatric): Secondary | ICD-10-CM | POA: Diagnosis not present

## 2016-01-30 ENCOUNTER — Other Ambulatory Visit: Payer: Self-pay | Admitting: Geriatric Medicine

## 2016-01-30 MED ORDER — LEVOTHYROXINE SODIUM 75 MCG PO TABS: 75.0000 ug | ORAL_TABLET | Freq: Every day | ORAL | Status: DC

## 2016-01-30 MED ORDER — SERTRALINE HCL 100 MG PO TABS: ORAL_TABLET | ORAL | Status: DC

## 2016-01-30 MED ORDER — OMEPRAZOLE 40 MG PO CPDR: 40.0000 mg | DELAYED_RELEASE_CAPSULE | Freq: Every day | ORAL | Status: DC

## 2016-03-18 DIAGNOSIS — M797 Fibromyalgia: Secondary | ICD-10-CM | POA: Diagnosis not present

## 2016-03-18 DIAGNOSIS — M47812 Spondylosis without myelopathy or radiculopathy, cervical region: Secondary | ICD-10-CM | POA: Diagnosis not present

## 2016-03-18 DIAGNOSIS — Z79891 Long term (current) use of opiate analgesic: Secondary | ICD-10-CM | POA: Diagnosis not present

## 2016-03-18 DIAGNOSIS — G894 Chronic pain syndrome: Secondary | ICD-10-CM | POA: Diagnosis not present

## 2016-04-10 ENCOUNTER — Ambulatory Visit: Payer: Commercial Managed Care - HMO | Admitting: Internal Medicine

## 2016-04-17 DIAGNOSIS — G4733 Obstructive sleep apnea (adult) (pediatric): Secondary | ICD-10-CM | POA: Diagnosis not present

## 2016-05-13 DIAGNOSIS — M47812 Spondylosis without myelopathy or radiculopathy, cervical region: Secondary | ICD-10-CM | POA: Diagnosis not present

## 2016-05-13 DIAGNOSIS — G894 Chronic pain syndrome: Secondary | ICD-10-CM | POA: Diagnosis not present

## 2016-05-13 DIAGNOSIS — M797 Fibromyalgia: Secondary | ICD-10-CM | POA: Diagnosis not present

## 2016-05-13 DIAGNOSIS — Z79891 Long term (current) use of opiate analgesic: Secondary | ICD-10-CM | POA: Diagnosis not present

## 2016-05-16 ENCOUNTER — Encounter: Payer: Self-pay | Admitting: Internal Medicine

## 2016-05-16 ENCOUNTER — Other Ambulatory Visit (INDEPENDENT_AMBULATORY_CARE_PROVIDER_SITE_OTHER): Payer: Medicare Other

## 2016-05-16 ENCOUNTER — Ambulatory Visit (INDEPENDENT_AMBULATORY_CARE_PROVIDER_SITE_OTHER): Payer: Medicare Other | Admitting: Internal Medicine

## 2016-05-16 VITALS — BP 132/82 | HR 88 | Temp 98.3°F | Resp 14 | Ht 66.0 in | Wt 251.0 lb

## 2016-05-16 DIAGNOSIS — M1990 Unspecified osteoarthritis, unspecified site: Secondary | ICD-10-CM

## 2016-05-16 DIAGNOSIS — E039 Hypothyroidism, unspecified: Secondary | ICD-10-CM | POA: Diagnosis not present

## 2016-05-16 DIAGNOSIS — Z23 Encounter for immunization: Secondary | ICD-10-CM

## 2016-05-16 LAB — COMPREHENSIVE METABOLIC PANEL
ALT: 10 U/L (ref 0–35)
AST: 14 U/L (ref 0–37)
Albumin: 4.2 g/dL (ref 3.5–5.2)
Alkaline Phosphatase: 96 U/L (ref 39–117)
BUN: 24 mg/dL — ABNORMAL HIGH (ref 6–23)
CO2: 31 mEq/L (ref 19–32)
Calcium: 9.4 mg/dL (ref 8.4–10.5)
Chloride: 104 mEq/L (ref 96–112)
Creatinine, Ser: 1.02 mg/dL (ref 0.40–1.20)
GFR: 57.26 mL/min — ABNORMAL LOW (ref >60.00)
Glucose, Bld: 99 mg/dL (ref 70–99)
Potassium: 4.5 mEq/L (ref 3.5–5.1)
Sodium: 141 mEq/L (ref 135–145)
Total Bilirubin: 0.8 mg/dL (ref 0.2–1.2)
Total Protein: 7.1 g/dL (ref 6.0–8.3)

## 2016-05-16 LAB — TSH: TSH: 3.57 u[IU]/mL (ref 0.35–4.50)

## 2016-05-16 LAB — T4, FREE: Free T4: 0.66 ng/dL (ref 0.60–1.60)

## 2016-05-16 LAB — MAGNESIUM: Magnesium: 2.3 mg/dL (ref 1.5–2.5)

## 2016-05-16 NOTE — Progress Notes (Signed)
   Subjective:    Patient ID: Joanne Klein, female    DOB: 1946-05-07, 70 y.o.   MRN: [id number]  HPI The patient is a 70 YO female coming in for follow up of her thyroid. She is not having diarrhea or constipation. She denies fatigue or chills. No heat or cold intolerance. She is also still struggling with her arthritis and is still seeing pain management that prescribes her percocet. More pain in her back and her knees. She is also using some numbing gel on her knees which helps take the edge off.   Review of Systems  Constitutional: Negative for fever, activity change, appetite change and fatigue.  Respiratory: Negative for cough, chest tightness, shortness of breath and wheezing.   Cardiovascular: Negative for chest pain, palpitations and leg swelling.  Gastrointestinal: Negative for abdominal pain, diarrhea, constipation and abdominal distention.  Musculoskeletal: Positive for arthralgias and gait problem. Negative for myalgias.  Neurological: Negative.       Objective:   Physical Exam  Constitutional: She is oriented to person, place, and time. She appears well-developed and well-nourished.  Obese  HENT:  Head: Normocephalic and atraumatic.  Eyes: EOM are normal.  Neck: Normal range of motion.  Cardiovascular: Normal rate and regular rhythm.   Pulmonary/Chest: Effort normal and breath sounds normal.  Abdominal: Soft. Bowel sounds are normal. She exhibits no distension. There is no tenderness. There is no rebound.  Musculoskeletal: Normal range of motion.  Neurological: She is alert and oriented to person, place, and time. Coordination normal.  Skin: Skin is warm and dry.   Filed Vitals:   05/16/16 1533  BP: 132/82  Pulse: 88  Temp: 98.3 F (36.8 C)  TempSrc: Oral  Resp: 14  Height: 5\' 6"  (1.676 m)  Weight: 251 lb (113.853 kg)  SpO2: 93%      Assessment & Plan:

## 2016-05-16 NOTE — Progress Notes (Signed)
Pre visit review using our clinic review tool, if applicable. No additional management support is needed unless otherwise documented below in the visit note. 

## 2016-05-16 NOTE — Assessment & Plan Note (Signed)
BMI is increasing and we did discuss the need for more exercise even in the pool to help with weight as this is likely worsening her arthritis pains.

## 2016-05-16 NOTE — Assessment & Plan Note (Addendum)
Checking her TSH and adjust as needed. She is taking synthroid 75 mcg daily. No symptoms of over or under replacement.

## 2016-05-16 NOTE — Assessment & Plan Note (Signed)
Stable and she is going to pain management who prescribes her oxycodone.

## 2016-05-16 NOTE — Patient Instructions (Signed)
We are checking the labs today and will call you back about the results.    

## 2016-05-22 DIAGNOSIS — Z0100 Encounter for examination of eyes and vision without abnormal findings: Secondary | ICD-10-CM | POA: Diagnosis not present

## 2016-05-22 DIAGNOSIS — H2513 Age-related nuclear cataract, bilateral: Secondary | ICD-10-CM | POA: Diagnosis not present

## 2016-07-24 DIAGNOSIS — G4733 Obstructive sleep apnea (adult) (pediatric): Secondary | ICD-10-CM | POA: Diagnosis not present

## 2016-07-25 ENCOUNTER — Telehealth: Payer: Self-pay | Admitting: Emergency Medicine

## 2016-07-25 NOTE — Telephone Encounter (Signed)
Needs a copy of the patients sleep study faxed over. Fax # : [phone]. Please follow up thanks.

## 2016-07-29 NOTE — Telephone Encounter (Signed)
I do not have a sleep study on file in patient's chart. I called and talked to [facility] and informed her.

## 2016-07-29 NOTE — Telephone Encounter (Signed)
Called back again to get the pts sleep studies faxed over. Thanks.

## 2016-09-08 ENCOUNTER — Other Ambulatory Visit: Payer: Self-pay | Admitting: *Deleted

## 2016-09-08 MED ORDER — ATORVASTATIN CALCIUM 20 MG PO TABS: ORAL_TABLET | ORAL | 1 refills | Status: DC

## 2016-09-08 NOTE — Telephone Encounter (Signed)
Rec'd call pt requesting new rx to be sent to [facility] on her Atorvastatin. Sent electronically...Johny Chess

## 2016-09-09 DIAGNOSIS — M47812 Spondylosis without myelopathy or radiculopathy, cervical region: Secondary | ICD-10-CM | POA: Diagnosis not present

## 2016-09-09 DIAGNOSIS — G894 Chronic pain syndrome: Secondary | ICD-10-CM | POA: Diagnosis not present

## 2016-09-09 DIAGNOSIS — Z79891 Long term (current) use of opiate analgesic: Secondary | ICD-10-CM | POA: Diagnosis not present

## 2016-09-09 DIAGNOSIS — M797 Fibromyalgia: Secondary | ICD-10-CM | POA: Diagnosis not present

## 2016-10-24 DIAGNOSIS — G4733 Obstructive sleep apnea (adult) (pediatric): Secondary | ICD-10-CM | POA: Diagnosis not present

## 2016-10-26 ENCOUNTER — Other Ambulatory Visit: Payer: Self-pay | Admitting: Internal Medicine

## 2016-11-06 ENCOUNTER — Telehealth: Payer: Self-pay | Admitting: *Deleted

## 2016-11-06 MED ORDER — OMEPRAZOLE 40 MG PO CPDR: 40.0000 mg | DELAYED_RELEASE_CAPSULE | Freq: Every day | ORAL | 1 refills | Status: DC

## 2016-11-06 NOTE — Telephone Encounter (Signed)
Rec'd call pt states she is needing rx sent to [facility] for her Omeprazole. Sent electronically...Johny Chess

## 2016-11-18 ENCOUNTER — Ambulatory Visit (INDEPENDENT_AMBULATORY_CARE_PROVIDER_SITE_OTHER): Payer: Medicare Other | Admitting: Internal Medicine

## 2016-11-18 ENCOUNTER — Ambulatory Visit (INDEPENDENT_AMBULATORY_CARE_PROVIDER_SITE_OTHER)
Admission: RE | Admit: 2016-11-18 | Discharge: 2016-11-18 | Disposition: A | Discharge status: Home / Self Care | Payer: Medicare Other | Source: Ambulatory Visit | Attending: Internal Medicine | Admitting: Internal Medicine

## 2016-11-18 ENCOUNTER — Encounter: Payer: Self-pay | Admitting: Internal Medicine

## 2016-11-18 ENCOUNTER — Other Ambulatory Visit (INDEPENDENT_AMBULATORY_CARE_PROVIDER_SITE_OTHER): Payer: Medicare Other

## 2016-11-18 VITALS — BP 130/82 | HR 93 | Temp 98.1°F | Resp 18 | Ht 66.0 in | Wt 251.0 lb

## 2016-11-18 DIAGNOSIS — E2839 Other primary ovarian failure: Secondary | ICD-10-CM | POA: Diagnosis not present

## 2016-11-18 DIAGNOSIS — Z0000 Encounter for general adult medical examination without abnormal findings: Secondary | ICD-10-CM | POA: Diagnosis not present

## 2016-11-18 DIAGNOSIS — E785 Hyperlipidemia, unspecified: Secondary | ICD-10-CM

## 2016-11-18 DIAGNOSIS — M1990 Unspecified osteoarthritis, unspecified site: Secondary | ICD-10-CM

## 2016-11-18 DIAGNOSIS — K219 Gastro-esophageal reflux disease without esophagitis: Secondary | ICD-10-CM

## 2016-11-18 DIAGNOSIS — J449 Chronic obstructive pulmonary disease, unspecified: Secondary | ICD-10-CM

## 2016-11-18 DIAGNOSIS — E039 Hypothyroidism, unspecified: Secondary | ICD-10-CM

## 2016-11-18 LAB — COMPREHENSIVE METABOLIC PANEL
ALT: 9 U/L (ref 0–35)
AST: 12 U/L (ref 0–37)
Albumin: 4.4 g/dL (ref 3.5–5.2)
Alkaline Phosphatase: 90 U/L (ref 39–117)
BUN: 32 mg/dL — ABNORMAL HIGH (ref 6–23)
CO2: 27 mEq/L (ref 19–32)
Calcium: 9.3 mg/dL (ref 8.4–10.5)
Chloride: 104 mEq/L (ref 96–112)
Creatinine, Ser: 1.34 mg/dL — ABNORMAL HIGH (ref 0.40–1.20)
GFR: 41.73 mL/min — ABNORMAL LOW (ref >60.00)
Glucose, Bld: 89 mg/dL (ref 70–99)
Potassium: 4.8 mEq/L (ref 3.5–5.1)
Sodium: 141 mEq/L (ref 135–145)
Total Bilirubin: 0.8 mg/dL (ref 0.2–1.2)
Total Protein: 7.4 g/dL (ref 6.0–8.3)

## 2016-11-18 LAB — LIPID PANEL
Cholesterol: 197 mg/dL (ref 0–200)
HDL: 51.9 mg/dL (ref >39.00)
LDL Cholesterol: 118 mg/dL — ABNORMAL HIGH (ref 0–99)
NonHDL: 145.43
Total CHOL/HDL Ratio: 4
Triglycerides: 135 mg/dL (ref 0.0–149.0)
VLDL: 27 mg/dL (ref 0.0–40.0)

## 2016-11-18 LAB — VITAMIN D 25 HYDROXY (VIT D DEFICIENCY, FRACTURES): VITD: 30.04 ng/mL (ref 30.00–100.00)

## 2016-11-18 NOTE — Progress Notes (Signed)
Pre visit review using our clinic review tool, if applicable. No additional management support is needed unless otherwise documented below in the visit note. 

## 2016-11-18 NOTE — Patient Instructions (Addendum)
We have given you the note for the CPAP today and we will check the labs.   Health Maintenance, Female Introduction Adopting a healthy lifestyle and getting preventive care can go a long way to promote health and wellness. Talk with your health care provider about what schedule of regular examinations is right for you. This is a good chance for you to check in with your provider about disease prevention and staying healthy. In between checkups, there are plenty of things you can do on your own. Experts have done a lot of research about which lifestyle changes and preventive measures are most likely to keep you healthy. Ask your health care provider for more information. Weight and diet Eat a healthy diet  Be sure to include plenty of vegetables, fruits, low-fat dairy products, and lean protein.  Do not eat a lot of foods high in solid fats, added sugars, or salt.  Get regular exercise. This is one of the most important things you can do for your health.  Most adults should exercise for at least 150 minutes each week. The exercise should increase your heart rate and make you sweat (moderate-intensity exercise).  Most adults should also do strengthening exercises at least twice a week. This is in addition to the moderate-intensity exercise. Maintain a healthy weight  Body mass index (BMI) is a measurement that can be used to identify possible weight problems. It estimates body fat based on height and weight. Your health care provider can help determine your BMI and help you achieve or maintain a healthy weight.  For females 65 years of age and older:  A BMI below 18.5 is considered underweight.  A BMI of 18.5 to 24.9 is normal.  A BMI of 25 to 29.9 is considered overweight.  A BMI of 30 and above is considered obese. Watch levels of cholesterol and blood lipids  You should start having your blood tested for lipids and cholesterol at 68 years of age, then have this test every 5  years.  You may need to have your cholesterol levels checked more often if:  Your lipid or cholesterol levels are high.  You are older than 68 years of age.  You are at high risk for heart disease. Cancer screening Lung Cancer  Lung cancer screening is recommended for adults 68-24 years old who are at high risk for lung cancer because of a history of smoking.  A yearly low-dose CT scan of the lungs is recommended for people who:  Currently smoke.  Have quit within the past 15 years.  Have at least a 30-pack-year history of smoking. A pack year is smoking an average of one pack of cigarettes a day for 1 year.  Yearly screening should continue until it has been 15 years since you quit.  Yearly screening should stop if you develop a health problem that would prevent you from having lung cancer treatment. Breast Cancer  Practice breast self-awareness. This means understanding how your breasts normally appear and feel.  It also means doing regular breast self-exams. Let your health care provider know about any changes, no matter how small.  If you are in your 68s or 30s, you should have a clinical breast exam (CBE) by a health care provider every 1-3 years as part of a regular health exam.  If you are 13 or older, have a CBE every year. Also consider having a breast X-ray (mammogram) every year.  If you have a family history of breast cancer, talk  to your health care provider about genetic screening.  If you are at high risk for breast cancer, talk to your health care provider about having an MRI and a mammogram every year.  Breast cancer gene (BRCA) assessment is recommended for women who have family members with BRCA-related cancers. BRCA-related cancers include:  Breast.  Ovarian.  Tubal.  Peritoneal cancers.  Results of the assessment will determine the need for genetic counseling and BRCA1 and BRCA2 testing. Cervical Cancer  Your health care provider may recommend  that you be screened regularly for cancer of the pelvic organs (ovaries, uterus, and vagina). This screening involves a pelvic examination, including checking for microscopic changes to the surface of your cervix (Pap test). You may be encouraged to have this screening done every 3 years, beginning at age 68.  For women ages 68-65, health care providers may recommend pelvic exams and Pap testing every 3 years, or they may recommend the Pap and pelvic exam, combined with testing for human papilloma virus (HPV), every 5 years. Some types of HPV increase your risk of cervical cancer. Testing for HPV may also be done on women of any age with unclear Pap test results.  Other health care providers may not recommend any screening for nonpregnant women who are considered low risk for pelvic cancer and who do not have symptoms. Ask your health care provider if a screening pelvic exam is right for you.  If you have had past treatment for cervical cancer or a condition that could lead to cancer, you need Pap tests and screening for cancer for at least 20 years after your treatment. If Pap tests have been discontinued, your risk factors (such as having a new sexual partner) need to be reassessed to determine if screening should resume. Some women have medical problems that increase the chance of getting cervical cancer. In these cases, your health care provider may recommend more frequent screening and Pap tests. Colorectal Cancer  This type of cancer can be detected and often prevented.  Routine colorectal cancer screening usually begins at 68 years of age and continues through 68 years of age.  Your health care provider may recommend screening at an earlier age if you have risk factors for colon cancer.  Your health care provider may also recommend using home test kits to check for hidden blood in the stool.  A small camera at the end of a tube can be used to examine your colon directly (sigmoidoscopy or  colonoscopy). This is done to check for the earliest forms of colorectal cancer.  Routine screening usually begins at age 68.  Direct examination of the colon should be repeated every 5-10 years through 68 years of age. However, you may need to be screened more often if early forms of precancerous polyps or small growths are found. Skin Cancer  Check your skin from head to toe regularly.  Tell your health care provider about any new moles or changes in moles, especially if there is a change in a mole's shape or color.  Also tell your health care provider if you have a mole that is larger than the size of a pencil eraser.  Always use sunscreen. Apply sunscreen liberally and repeatedly throughout the day.  Protect yourself by wearing long sleeves, pants, a wide-brimmed hat, and sunglasses whenever you are outside. Heart disease, diabetes, and high blood pressure  High blood pressure causes heart disease and increases the risk of stroke. High blood pressure is more likely to develop  in:  People who have blood pressure in the high end of the normal range (130-139/85-89 mm Hg).  People who are overweight or obese.  People who are African American.  If you are 72-12 years of age, have your blood pressure checked every 3-5 years. If you are 24 years of age or older, have your blood pressure checked every year. You should have your blood pressure measured twice-once when you are at a hospital or clinic, and once when you are not at a hospital or clinic. Record the average of the two measurements. To check your blood pressure when you are not at a hospital or clinic, you can use:  An automated blood pressure machine at a pharmacy.  A home blood pressure monitor.  If you are between 52 years and 79 years old, ask your health care provider if you should take aspirin to prevent strokes.  Have regular diabetes screenings. This involves taking a blood sample to check your fasting blood sugar  level.  If you are at a normal weight and have a low risk for diabetes, have this test once every three years after 68 years of age.  If you are overweight and have a high risk for diabetes, consider being tested at a younger age or more often. Preventing infection Hepatitis B  If you have a higher risk for hepatitis B, you should be screened for this virus. You are considered at high risk for hepatitis B if:  You were born in a country where hepatitis B is common. Ask your health care provider which countries are considered high risk.  Your parents were born in a high-risk country, and you have not been immunized against hepatitis B (hepatitis B vaccine).  You have HIV or AIDS.  You use needles to inject street drugs.  You live with someone who has hepatitis B.  You have had sex with someone who has hepatitis B.  You get hemodialysis treatment.  You take certain medicines for conditions, including cancer, organ transplantation, and autoimmune conditions. Hepatitis C  Blood testing is recommended for:  Everyone born from [birth year] through 1963.  Anyone with known risk factors for hepatitis C. Sexually transmitted infections (STIs)  You should be screened for sexually transmitted infections (STIs) including gonorrhea and chlamydia if:  You are sexually active and are younger than 68 years of age.  You are older than 68 years of age and your health care provider tells you that you are at risk for this type of infection.  Your sexual activity has changed since you were last screened and you are at an increased risk for chlamydia or gonorrhea. Ask your health care provider if you are at risk.  If you do not have HIV, but are at risk, it may be recommended that you take a prescription medicine daily to prevent HIV infection. This is called pre-exposure prophylaxis (PrEP). You are considered at risk if:  You are sexually active and do not regularly use condoms or know the HIV status  of your partner(s).  You take drugs by injection.  You are sexually active with a partner who has HIV. Talk with your health care provider about whether you are at high risk of being infected with HIV. If you choose to begin PrEP, you should first be tested for HIV. You should then be tested every 3 months for as long as you are taking PrEP. Pregnancy  If you are premenopausal and you may become pregnant, ask your health care  provider about preconception counseling.  If you may become pregnant, take 400 to 800 micrograms (mcg) of folic acid every day.  If you want to prevent pregnancy, talk to your health care provider about birth control (contraception). Osteoporosis and menopause  Osteoporosis is a disease in which the bones lose minerals and strength with aging. This can result in serious bone fractures. Your risk for osteoporosis can be identified using a bone density scan.  If you are 34 years of age or older, or if you are at risk for osteoporosis and fractures, ask your health care provider if you should be screened.  Ask your health care provider whether you should take a calcium or vitamin D supplement to lower your risk for osteoporosis.  Menopause may have certain physical symptoms and risks.  Hormone replacement therapy may reduce some of these symptoms and risks. Talk to your health care provider about whether hormone replacement therapy is right for you. Follow these instructions at home:  Schedule regular health, dental, and eye exams.  Stay current with your immunizations.  Do not use any tobacco products including cigarettes, chewing tobacco, or electronic cigarettes.  If you are pregnant, do not drink alcohol.  If you are breastfeeding, limit how much and how often you drink alcohol.  Limit alcohol intake to no more than 1 drink per day for nonpregnant women. One drink equals 12 ounces of beer, 5 ounces of wine, or 1 ounces of hard liquor.  Do not use street  drugs.  Do not share needles.  Ask your health care provider for help if you need support or information about quitting drugs.  Tell your health care provider if you often feel depressed.  Tell your health care provider if you have ever been abused or do not feel safe at home. This information is not intended to replace advice given to you by your health care provider. Make sure you discuss any questions you have with your health care provider. Document Released: 06/23/2011 Document Revised: 05/15/2016 Document Reviewed: 09/11/2015  2017 [employer]vier

## 2016-11-18 NOTE — Progress Notes (Signed)
   Subjective:    Patient ID: Joanne Klein, female    DOB: October 22, 1946, 70 y.o.   MRN: [id number]  HPI Here for medicare wellness and physical, no new complaints. Please see A/P for status and treatment of chronic medical problems.   Diet: heart healthy Physical activity: sedentary Depression/mood screen: negative, with meds Hearing: intact to whispered voice Visual acuity: grossly normal with lens, performs annual eye exam  ADLs: capable Fall risk: none Home safety: good Cognitive evaluation: intact to orientation, naming, recall and repetition EOL planning: adv directives discussed, not in place  I have personally reviewed and have noted 1. The patient's medical and social history - reviewed today no changes 2. Their use of alcohol, tobacco or illicit drugs 3. Their current medications and supplements 4. The patient's functional ability including ADL's, fall risks, home safety risks and hearing or visual impairment. 5. Diet and physical activities 6. Evidence for depression or mood disorders 7. Care team reviewed and updated (available in snapshot)  Review of Systems  Constitutional: Negative for activity change, appetite change, fatigue and fever.  HENT: Negative.   Eyes: Negative.   Respiratory: Negative for cough, chest tightness and shortness of breath.   Cardiovascular: Negative for chest pain, palpitations and leg swelling.  Gastrointestinal: Negative for abdominal distention, abdominal pain, constipation, diarrhea, nausea and vomiting.  Musculoskeletal: Negative.   Skin: Negative.   Neurological: Negative.   Psychiatric/Behavioral: Negative.       Objective:   Physical Exam  Constitutional: She is oriented to person, place, and time. She appears well-developed and well-nourished.  HENT:  Head: Normocephalic and atraumatic.  Eyes: EOM are normal.  Neck: Normal range of motion.  Cardiovascular: Normal rate and regular rhythm.   Pulmonary/Chest: Effort normal and  breath sounds normal. No respiratory distress. She has no wheezes. She has no rales.  Abdominal: Soft. Bowel sounds are normal. She exhibits no distension. There is no tenderness. There is no rebound.  Musculoskeletal: She exhibits no edema.  Neurological: She is alert and oriented to person, place, and time. Coordination normal.  Skin: Skin is warm and dry.  Psychiatric: She has a normal mood and affect.   Vitals:   11/18/16 1534  BP: 130/82  Pulse: 93  Resp: 18  Temp: 98.1 F (36.7 C)  TempSrc: Oral  SpO2: 97%  Weight: 251 lb (113.9 kg)  Height: 5\' 6"  (1.676 m)      Assessment & Plan:

## 2016-11-19 DIAGNOSIS — Z0000 Encounter for general adult medical examination without abnormal findings: Secondary | ICD-10-CM | POA: Insufficient documentation

## 2016-11-19 NOTE — Assessment & Plan Note (Signed)
Seeing pain specialist for her percocet prescription and they are monitoring.

## 2016-11-19 NOTE — Assessment & Plan Note (Signed)
Slightly worse recently with weight gain, taking omeprazole with partial relief and can add tums when needed.

## 2016-11-19 NOTE — Assessment & Plan Note (Signed)
Colonoscopy due in 2020, tetanus up to date. Declines shingles. Flu shot done already. Counseled on sun safety and mole surveillance as well as dangers of distracted driving. Mammogram and dexa ordered. Given 10 year screening recommendations.

## 2016-11-19 NOTE — Assessment & Plan Note (Signed)
Using albuterol prn and not using much. No flare today or in the last year.

## 2016-11-19 NOTE — Assessment & Plan Note (Signed)
Recent TSH normal so synthroid 75 mcg daily does not need adjustment.

## 2016-11-19 NOTE — Assessment & Plan Note (Signed)
Checking lipid panel and CMP and adjust her lipitor as needed. Goal LDL <130.

## 2016-12-08 ENCOUNTER — Other Ambulatory Visit: Payer: Self-pay | Admitting: Internal Medicine

## 2016-12-08 DIAGNOSIS — Z1231 Encounter for screening mammogram for malignant neoplasm of breast: Secondary | ICD-10-CM

## 2016-12-09 DIAGNOSIS — Z79891 Long term (current) use of opiate analgesic: Secondary | ICD-10-CM | POA: Diagnosis not present

## 2016-12-09 DIAGNOSIS — M797 Fibromyalgia: Secondary | ICD-10-CM | POA: Diagnosis not present

## 2016-12-09 DIAGNOSIS — G894 Chronic pain syndrome: Secondary | ICD-10-CM | POA: Diagnosis not present

## 2016-12-09 DIAGNOSIS — M47812 Spondylosis without myelopathy or radiculopathy, cervical region: Secondary | ICD-10-CM | POA: Diagnosis not present

## 2017-01-26 DIAGNOSIS — G4733 Obstructive sleep apnea (adult) (pediatric): Secondary | ICD-10-CM | POA: Diagnosis not present

## 2017-02-03 DIAGNOSIS — Z79891 Long term (current) use of opiate analgesic: Secondary | ICD-10-CM | POA: Diagnosis not present

## 2017-02-03 DIAGNOSIS — G894 Chronic pain syndrome: Secondary | ICD-10-CM | POA: Diagnosis not present

## 2017-02-03 DIAGNOSIS — M47812 Spondylosis without myelopathy or radiculopathy, cervical region: Secondary | ICD-10-CM | POA: Diagnosis not present

## 2017-02-03 DIAGNOSIS — M797 Fibromyalgia: Secondary | ICD-10-CM | POA: Diagnosis not present

## 2017-02-15 ENCOUNTER — Other Ambulatory Visit: Payer: Self-pay | Admitting: Internal Medicine

## 2017-04-21 DIAGNOSIS — Z79891 Long term (current) use of opiate analgesic: Secondary | ICD-10-CM | POA: Diagnosis not present

## 2017-04-21 DIAGNOSIS — M47812 Spondylosis without myelopathy or radiculopathy, cervical region: Secondary | ICD-10-CM | POA: Diagnosis not present

## 2017-04-21 DIAGNOSIS — G894 Chronic pain syndrome: Secondary | ICD-10-CM | POA: Diagnosis not present

## 2017-04-21 DIAGNOSIS — M797 Fibromyalgia: Secondary | ICD-10-CM | POA: Diagnosis not present

## 2017-05-10 ENCOUNTER — Other Ambulatory Visit: Payer: Self-pay | Admitting: Internal Medicine

## 2017-05-19 ENCOUNTER — Ambulatory Visit: Payer: Medicare Other | Admitting: Internal Medicine

## 2017-05-25 ENCOUNTER — Ambulatory Visit: Payer: Medicare Other | Admitting: Internal Medicine

## 2017-05-27 DIAGNOSIS — H524 Presbyopia: Secondary | ICD-10-CM | POA: Diagnosis not present

## 2017-05-27 DIAGNOSIS — H5203 Hypermetropia, bilateral: Secondary | ICD-10-CM | POA: Diagnosis not present

## 2017-05-27 DIAGNOSIS — H2513 Age-related nuclear cataract, bilateral: Secondary | ICD-10-CM | POA: Diagnosis not present

## 2017-05-29 ENCOUNTER — Ambulatory Visit: Payer: Medicare Other | Admitting: Internal Medicine

## 2017-06-01 ENCOUNTER — Ambulatory Visit: Payer: Medicare Other | Admitting: Family

## 2017-06-17 DIAGNOSIS — M546 Pain in thoracic spine: Secondary | ICD-10-CM | POA: Diagnosis not present

## 2017-06-17 DIAGNOSIS — G894 Chronic pain syndrome: Secondary | ICD-10-CM | POA: Diagnosis not present

## 2017-06-17 DIAGNOSIS — M62830 Muscle spasm of back: Secondary | ICD-10-CM | POA: Diagnosis not present

## 2017-06-17 DIAGNOSIS — Z79891 Long term (current) use of opiate analgesic: Secondary | ICD-10-CM | POA: Diagnosis not present

## 2017-06-17 DIAGNOSIS — M797 Fibromyalgia: Secondary | ICD-10-CM | POA: Diagnosis not present

## 2017-07-12 ENCOUNTER — Other Ambulatory Visit: Payer: Self-pay | Admitting: Internal Medicine

## 2017-08-06 DIAGNOSIS — G4733 Obstructive sleep apnea (adult) (pediatric): Secondary | ICD-10-CM | POA: Diagnosis not present

## 2017-08-18 DIAGNOSIS — G894 Chronic pain syndrome: Secondary | ICD-10-CM | POA: Diagnosis not present

## 2017-08-18 DIAGNOSIS — M62830 Muscle spasm of back: Secondary | ICD-10-CM | POA: Diagnosis not present

## 2017-08-18 DIAGNOSIS — Z79891 Long term (current) use of opiate analgesic: Secondary | ICD-10-CM | POA: Diagnosis not present

## 2017-08-18 DIAGNOSIS — M797 Fibromyalgia: Secondary | ICD-10-CM | POA: Diagnosis not present

## 2017-09-21 ENCOUNTER — Telehealth: Payer: Self-pay | Admitting: Emergency Medicine

## 2017-09-21 NOTE — Telephone Encounter (Signed)
Patient called and asked that your give her a call back. She is interested in having a sleep study done and wants to discuss it with you. Thanks.

## 2017-09-21 NOTE — Telephone Encounter (Signed)
Yes, there are sleep specialists but generally they want you to see [place] first so we can tell them about the problem.

## 2017-09-21 NOTE — Telephone Encounter (Signed)
Please advise on what I should tell patient. Would you like an appointment scheduled for this?

## 2017-09-21 NOTE — Telephone Encounter (Signed)
Can schedule apt if she wants to discuss sleep study

## 2017-09-21 NOTE — Telephone Encounter (Signed)
Patient states that she doesn't want to wait till November for her appointment and does not want to have to come here twice. Wants to know if you know of any doctors that specialize in sleep disorders. States that the last couple of times she has told you that she was sleeping 14-15 hours and now she is sleeping about 36 hours, gets up to use restroom and eat but goes right back to sleep. I informed her that you may want her to see her anyway and she stated if that is the case then she will wait till November and sleep till then.

## 2017-09-23 NOTE — Telephone Encounter (Signed)
Patient called and notified.

## 2017-10-11 ENCOUNTER — Other Ambulatory Visit: Payer: Self-pay | Admitting: Internal Medicine

## 2017-10-23 DIAGNOSIS — M797 Fibromyalgia: Secondary | ICD-10-CM | POA: Diagnosis not present

## 2017-10-23 DIAGNOSIS — Z79891 Long term (current) use of opiate analgesic: Secondary | ICD-10-CM | POA: Diagnosis not present

## 2017-10-23 DIAGNOSIS — M62830 Muscle spasm of back: Secondary | ICD-10-CM | POA: Diagnosis not present

## 2017-10-23 DIAGNOSIS — G894 Chronic pain syndrome: Secondary | ICD-10-CM | POA: Diagnosis not present

## 2017-11-19 NOTE — Progress Notes (Deleted)
Subjective:   Joanne Klein is a 71 y.o. female who presents for Medicare Annual (Subsequent) preventive examination.  Review of Systems:  No ROS.  Medicare Wellness Visit. Additional risk factors are reflected in the social history.    Sleep patterns: {SX; SLEEP PATTERNS:18802::"feels rested on waking","does not get up to void","gets up *** times nightly to void","sleeps *** hours nightly"}.   Home Safety/Smoke Alarms: Feels safe in home. Smoke alarms in place.  Living environment; residence and Firearm Safety: {Rehab home environment / accessibility:30080::"no firearms","firearms stored safely"}. Seat Belt Safety/Bike Helmet: Wears seat belt.    Objective:     Vitals: There were no vitals taken for this visit.  There is no height or weight on file to calculate BMI.  No flowsheet data found.  Tobacco Social History   Tobacco Use  Smoking Status Former Smoker  . Packs/day: 2.00  . Years: 34.00  . Pack years: 68.00  . Types: Cigarettes  . Last attempt to quit: 12/15/1996  . Years since quitting: 20.9  Smokeless Tobacco Never Used     Counseling given: Not Answered   Clinical Intake:                              Past Medical History:  Diagnosis Date  . Allergic rhinitis   . Arthritis   . Bursitis    Both Shoulders  . Chronic low back pain   . Degenerative disk disease   . Depression   . Diverticulitis   . Emphysema of lung ([place])   . GERD (gastroesophageal reflux disease)   . History of fibromyalgia   . Hypertension   . Hypothyroidism   . Irritable bowel syndrome   . Neuralgia, post-herpetic   . Obesity, unspecified   . Obstructive sleep apnea   . OCD (obsessive compulsive disorder)   . Pain in joint, site unspecified   . Pure hypercholesterolemia   . Scoliosis   . Situational stress   . Spastic colon    Past Surgical History:  Procedure Laterality Date  . BREAST LUMPECTOMY     knot removed  . CHOLECYSTECTOMY    . HYSTEROTOMY     . TONSILLECTOMY    . tumor in left forearm     Family History  Problem Relation Age of Onset  . Stomach cancer Father   . Liver cancer Father   . Diabetes Father   . Colon cancer Neg Hx    Social History   Socioeconomic History  . Marital status: Divorced    Spouse name: Not on file  . Number of children: Not on file  . Years of education: Not on file  . Highest education level: Not on file  Social Needs  . Financial resource strain: Not on file  . Food insecurity - worry: Not on file  . Food insecurity - inability: Not on file  . Transportation needs - medical: Not on file  . Transportation needs - non-medical: Not on file  Occupational History  . Not on file  Tobacco Use  . Smoking status: Former Smoker    Packs/day: 2.00    Years: 34.00    Pack years: 68.00    Types: Cigarettes    Last attempt to quit: 12/15/1996    Years since quitting: 20.9  . Smokeless tobacco: Never Used  Substance and Sexual Activity  . Alcohol use: No    Alcohol/week: 0.0 oz    Comment: Caffine  Intake 2x's daily  . Drug use: No  . Sexual activity: Not on file  Other Topics Concern  . Not on file  Social History Narrative  . Not on file    Outpatient Encounter Medications as of 11/20/2017  Medication Sig  . albuterol (PROVENTIL HFA;VENTOLIN HFA) 108 (90 BASE) MCG/ACT inhaler Inhale 2 puffs into the lungs every 6 (six) hours as needed for wheezing or shortness of breath. (Patient not taking: Reported on 04/05/2015)  . atorvastatin (LIPITOR) 20 MG tablet TAKE 1 TABLET BY MOUTH  EVERY OTHER DAY  . Cholecalciferol (VITAMIN D-3) 1000 UNITS CAPS Take by mouth.  . fluocinonide cream (LIDEX) 0.05 % Apply topically as needed.  Marland Kitchen levothyroxine (SYNTHROID, LEVOTHROID) 75 MCG tablet Take 1 tablet (75 mcg total) by mouth daily. KEEP 11/20/2017 appointment for further refills  . omeprazole (PRILOSEC) 40 MG capsule Take 1 capsule (40 mg total) by mouth daily. KEEP 11/20/2017 appointment for further  refills  . oxyCODONE-acetaminophen (PERCOCET) 10-325 MG tablet Take 1 tablet by mouth every 4 (four) hours as needed.  . polyethylene glycol (MIRALAX / GLYCOLAX) packet Take 17 g by mouth daily.  . sertraline (ZOLOFT) 100 MG tablet Take 2 tablets (200 mg total) by mouth at bedtime. KEEP 11/20/2017 appointment for further refills   No facility-administered encounter medications on file as of 11/20/2017.     Activities of Daily Living No flowsheet data found.  Timed Get Up and Go performed: ***  Patient Care Team: Hoyt Koch, MD as PCP - General (Internal Medicine) Inda Castle, MD (Gastroenterology) Nicholaus Bloom, MD (Anesthesiology)    Assessment:    Physical assessment deferred to PCP.  Exercise Activities and Dietary recommendations   Diet (meal preparation, eat out, water intake, caffeinated beverages, dairy products, fruits and vegetables): {Desc; diets:16563} Breakfast: Lunch:  Dinner:       Goals      Patient Stated   . patient (pt-stated)     Will explore mindfulness; and yoga Dr. Truitt Leep;  Address: [address], [place], [place] [address]  Phone: [phone]       Fall Risk Fall Risk  11/18/2016 10/04/2015  Falls in the past year? No Yes  Number falls in past yr: - 1  Follow up - Education provided  Comment - hurt knees and top of foot/ knees hurt;    Is the patient's home free of loose throw rugs in walkways, pet beds, electrical cords, etc?   {Blank single:19197::"yes","no"}      Grab bars in the bathroom? {Blank single:19197::"yes","no"}      Handrails on the stairs?   {Blank single:19197::"yes","no"}      Adequate lighting?   {Blank single:19197::"yes","no"}  Depression Screen PHQ 2/9 Scores 11/18/2016 10/04/2015  PHQ - 2 Score 0 0     Cognitive Function MMSE - Mini Mental State Exam 10/04/2015  Not completed: (No Data)        Immunization History  Administered Date(s) Administered  . Influenza,inj,Quad  PF,6+ Mos 09/29/2014, 10/04/2015  . Influenza-Unspecified 09/15/2011, 09/22/2013, 09/09/2016  . Pneumococcal Conjugate-13 10/04/2015  . Pneumococcal Polysaccharide-23 04/17/2014  . Td 01/23/2005  . Tdap 05/16/2016   Screening Tests Health Maintenance  Topic Date Due  . MAMMOGRAM  04/22/2015  . INFLUENZA VACCINE  07/22/2017  . COLONOSCOPY  03/22/2019  . TETANUS/TDAP  05/16/2026  . DEXA SCAN  Completed  . Hepatitis C Screening  Completed  . PNA vac Low Risk Adult  Completed   Cancer Screenings: Lung: *** Low  Dose CT Chest recommended if Age 19-80 years, 30 pack-year currently smoking OR have quit w/in 15years. Patient {DOES NOT does:27190::"does not"} qualify. Breast: *** Up to date on Mammogram? {Yes/No:30480221}  Up to date of Bone Density/Dexa? {Yes/No:30480221} Colorectal: ***  Additional Screenings: *** Hepatitis B/HIV/Syphillis: Hepatitis C Screening:      Plan:   ***   I have personally reviewed and noted the following in the patient's chart:   . Medical and social history . Use of alcohol, tobacco or illicit drugs  . Current medications and supplements . Functional ability and status . Nutritional status . Physical activity . Advanced directives . List of other physicians . Hospitalizations, surgeries, and ER visits in previous 12 months . Vitals . Screenings to include cognitive, depression, and falls . Referrals and appointments  In addition, I have reviewed and discussed with patient certain preventive protocols, quality metrics, and best practice recommendations. A written personalized care plan for preventive services as well as general preventive health recommendations were provided to patient.     Michiel Cowboy, RN  11/19/2017

## 2017-11-20 ENCOUNTER — Other Ambulatory Visit (INDEPENDENT_AMBULATORY_CARE_PROVIDER_SITE_OTHER): Payer: Medicare Other

## 2017-11-20 ENCOUNTER — Encounter: Payer: Self-pay | Admitting: Internal Medicine

## 2017-11-20 ENCOUNTER — Ambulatory Visit (INDEPENDENT_AMBULATORY_CARE_PROVIDER_SITE_OTHER): Payer: Medicare Other | Admitting: Internal Medicine

## 2017-11-20 VITALS — BP 130/84 | HR 88 | Temp 97.7°F | Ht 66.0 in | Wt 236.0 lb

## 2017-11-20 DIAGNOSIS — F3341 Major depressive disorder, recurrent, in partial remission: Secondary | ICD-10-CM | POA: Diagnosis not present

## 2017-11-20 DIAGNOSIS — K219 Gastro-esophageal reflux disease without esophagitis: Secondary | ICD-10-CM

## 2017-11-20 DIAGNOSIS — Z0000 Encounter for general adult medical examination without abnormal findings: Secondary | ICD-10-CM

## 2017-11-20 DIAGNOSIS — E785 Hyperlipidemia, unspecified: Secondary | ICD-10-CM | POA: Diagnosis not present

## 2017-11-20 DIAGNOSIS — G47419 Narcolepsy without cataplexy: Secondary | ICD-10-CM | POA: Diagnosis not present

## 2017-11-20 DIAGNOSIS — E039 Hypothyroidism, unspecified: Secondary | ICD-10-CM

## 2017-11-20 LAB — HEMOGLOBIN A1C: Hgb A1c MFr Bld: 5.4 % (ref 4.6–6.5)

## 2017-11-20 LAB — LIPID PANEL
Cholesterol: 187 mg/dL (ref 0–200)
HDL: 41.4 mg/dL (ref >39.00)
LDL Cholesterol: 113 mg/dL — ABNORMAL HIGH (ref 0–99)
NonHDL: 145.48
Total CHOL/HDL Ratio: 5
Triglycerides: 163 mg/dL — ABNORMAL HIGH (ref 0.0–149.0)
VLDL: 32.6 mg/dL (ref 0.0–40.0)

## 2017-11-20 LAB — COMPREHENSIVE METABOLIC PANEL
ALT: 11 U/L (ref 0–35)
AST: 17 U/L (ref 0–37)
Albumin: 4.2 g/dL (ref 3.5–5.2)
Alkaline Phosphatase: 78 U/L (ref 39–117)
BUN: 24 mg/dL — ABNORMAL HIGH (ref 6–23)
CO2: 31 mEq/L (ref 19–32)
Calcium: 9.4 mg/dL (ref 8.4–10.5)
Chloride: 105 mEq/L (ref 96–112)
Creatinine, Ser: 1.1 mg/dL (ref 0.40–1.20)
GFR: 52.24 mL/min — ABNORMAL LOW (ref >60.00)
Glucose, Bld: 91 mg/dL (ref 70–99)
Potassium: 5.1 mEq/L (ref 3.5–5.1)
Sodium: 140 mEq/L (ref 135–145)
Total Bilirubin: 1 mg/dL (ref 0.2–1.2)
Total Protein: 7 g/dL (ref 6.0–8.3)

## 2017-11-20 LAB — TSH: TSH: 2.02 u[IU]/mL (ref 0.35–4.50)

## 2017-11-20 LAB — CBC
HCT: 44.5 % (ref 36.0–46.0)
Hemoglobin: 15.1 g/dL — ABNORMAL HIGH (ref 12.0–15.0)
MCHC: 34 g/dL (ref 30.0–36.0)
MCV: 91.5 fl (ref 78.0–100.0)
Platelets: 157 10*3/uL (ref 150.0–400.0)
RBC: 4.86 Mil/uL (ref 3.87–5.11)
RDW: 13 % (ref 11.5–15.5)
WBC: 6.2 10*3/uL (ref 4.0–10.5)

## 2017-11-20 NOTE — Assessment & Plan Note (Signed)
Continue zoloft 200 mg daily and she has suffered with this since 30s and does not want to start.

## 2017-11-20 NOTE — Assessment & Plan Note (Signed)
Checking TSH and adjust synthroid 75 mcg daily as needed. 

## 2017-11-20 NOTE — Assessment & Plan Note (Signed)
Refill omeprazole.  She is not having as many symptoms lately and asked her to try to cut back if able.

## 2017-11-20 NOTE — Progress Notes (Signed)
Patient ID: VERTA RIEDLINGER, female   DOB: [birth year], 71 y.o.   MRN: [id number] Medical screening examination/treatment/procedure(s) were performed by non-physician practitioner and as supervising physician I was immediately available for consultation/collaboration. I agree with above. Hoyt Koch, MD

## 2017-11-20 NOTE — Assessment & Plan Note (Signed)
Concerned about her oxycodone usage (including her waking in the night to take pills) and not using CPAP all the time when she sleeps. She is seeing sleep specialist soon.

## 2017-11-20 NOTE — Assessment & Plan Note (Signed)
Checking labs, seeing sleep specialist soon. Declines flu shot today. Tetanus and pneumonia up to date. Counseled about the need to maintain abstinence from smoking and sun safety with mole surveillance. Given 10 year screening recommendations.

## 2017-11-20 NOTE — Assessment & Plan Note (Signed)
Weight is stable, reminded about the complications of her weight including osteoarthritis, OSA.

## 2017-11-20 NOTE — Progress Notes (Signed)
   Subjective:    Patient ID: Joanne Klein, female    DOB: 1946-06-30, 71 y.o.   MRN: [id number]  HPI The patient is a 71 YO female coming in for physical. Fall in September, not using CPAP. Still taking about 6 oxycodone per day. Sleeping about 14-16 hours per day.   PMH, [facility], social history reviewed and updated.   Review of Systems  Constitutional: Negative.   HENT: Negative.   Eyes: Negative.   Respiratory: Positive for shortness of breath. Negative for cough and chest tightness.   Cardiovascular: Negative for chest pain, palpitations and leg swelling.  Gastrointestinal: Negative for abdominal distention, abdominal pain, constipation, diarrhea, nausea and vomiting.  Musculoskeletal: Positive for arthralgias and back pain.  Skin: Negative.   Neurological: Negative.   Psychiatric/Behavioral: Positive for decreased concentration, dysphoric mood and sleep disturbance.      Objective:   Physical Exam  Constitutional: She is oriented to person, place, and time. She appears well-developed and well-nourished.  Overweight  HENT:  Head: Normocephalic and atraumatic.  Eyes: EOM are normal.  Neck: Normal range of motion.  Cardiovascular: Normal rate and regular rhythm.  Pulmonary/Chest: Effort normal and breath sounds normal. No respiratory distress. She has no wheezes. She has no rales.  Abdominal: Soft. Bowel sounds are normal. She exhibits no distension. There is no tenderness. There is no rebound.  Musculoskeletal: She exhibits no edema.  Neurological: She is alert and oriented to person, place, and time. Coordination normal.  Skin: Skin is warm and dry.  Psychiatric: She has a normal mood and affect.   Vitals:   11/20/17 1302  BP: 130/84  Pulse: 88  Temp: 97.7 F (36.5 C)  TempSrc: Oral  SpO2: 98%  Weight: 236 lb (107 kg)  Height: 5\' 6"  (1.676 m)      Assessment & Plan:

## 2017-11-20 NOTE — Progress Notes (Signed)
Subjective:   Joanne Klein is a 71 y.o. female who presents for Medicare Annual (Subsequent) preventive examination.  Review of Systems:  No ROS.  Medicare Wellness Visit. Additional risk factors are reflected in the social history.  Cardiac Risk Factors include: advanced age (>100men, >44 women) Sleep patterns: gets up 1-2 times nightly to void and sleeps 12-14 hours nightly.  Patient states she has a history of narcolepsy. She has an upcoming appointment with Dr. Annamaria Boots to discuss sleep issues.   Home Safety/Smoke Alarms: Feels safe in home. Smoke alarms in place.  Living environment; residence and Firearm Safety: 1-story house/ trailer, equipment: Hydrologist, Type: Tub Surveyor, quantity, no firearms. Lives alone,  no needs for DME, good support system Seat Belt Safety/Bike Helmet: Wears seat belt.    Objective:     Vitals: BP 130/84 (BP Location: Left Arm, Patient Position: Sitting, Cuff Size: Normal)   Pulse 88   Temp 97.7 F (36.5 C) (Oral)   Ht 5\' 6"  (1.676 m)   Wt 236 lb (107 kg)   SpO2 98%   BMI 38.09 kg/m   Body mass index is 38.09 kg/m.  Advanced Directives 11/20/2017  Does Patient Have a Medical Advance Directive? No  Would patient like information on creating a medical advance directive? No - Patient declined    Tobacco Social History   Tobacco Use  Smoking Status Former Smoker  . Packs/day: 2.00  . Years: 34.00  . Pack years: 68.00  . Types: Cigarettes  . Last attempt to quit: 12/15/1996  . Years since quitting: 20.9  Smokeless Tobacco Never Used     Counseling given: Not Answered  Past Medical History:  Diagnosis Date  . Allergic rhinitis   . Arthritis   . Bursitis    Both Shoulders  . Chronic low back pain   . Degenerative disk disease   . Depression   . Diverticulitis   . Emphysema of lung ([place])   . GERD (gastroesophageal reflux disease)   . History of fibromyalgia   . Hypertension   . Hypothyroidism   . Irritable bowel syndrome   .  Neuralgia, post-herpetic   . Obesity, unspecified   . Obstructive sleep apnea   . OCD (obsessive compulsive disorder)   . Pain in joint, site unspecified   . Pure hypercholesterolemia   . Scoliosis   . Situational stress   . Spastic colon    Past Surgical History:  Procedure Laterality Date  . BREAST LUMPECTOMY     knot removed  . CHOLECYSTECTOMY    . HYSTEROTOMY    . TONSILLECTOMY    . tumor in left forearm     Family History  Problem Relation Age of Onset  . Stomach cancer Father   . Liver cancer Father   . Diabetes Father   . Colon cancer Neg Hx    Social History   Socioeconomic History  . Marital status: Divorced    Spouse name: None  . Number of children: None  . Years of education: None  . Highest education level: None  Social Needs  . Financial resource strain: None  . Food insecurity - worry: None  . Food insecurity - inability: None  . Transportation needs - medical: None  . Transportation needs - non-medical: None  Occupational History  . None  Tobacco Use  . Smoking status: Former Smoker    Packs/day: 2.00    Years: 34.00    Pack years: 68.00    Types: Cigarettes  Last attempt to quit: 12/15/1996    Years since quitting: 20.9  . Smokeless tobacco: Never Used  Substance and Sexual Activity  . Alcohol use: No    Alcohol/week: 0.0 oz    Comment: Caffine Intake 2x's daily  . Drug use: No  . Sexual activity: None  Other Topics Concern  . None  Social History Narrative  . None    Outpatient Encounter Medications as of 11/20/2017  Medication Sig  . atorvastatin (LIPITOR) 20 MG tablet TAKE 1 TABLET BY MOUTH  EVERY OTHER DAY  . Cholecalciferol (VITAMIN D-3) 1000 UNITS CAPS Take by mouth.  . fluocinonide cream (LIDEX) 0.05 % Apply topically as needed.  Marland Kitchen levothyroxine (SYNTHROID, LEVOTHROID) 75 MCG tablet Take 1 tablet (75 mcg total) by mouth daily. KEEP 11/20/2017 appointment for further refills  . omeprazole (PRILOSEC) 40 MG capsule Take 1  capsule (40 mg total) by mouth daily. KEEP 11/20/2017 appointment for further refills  . oxyCODONE-acetaminophen (PERCOCET) 10-325 MG tablet Take 1 tablet by mouth every 4 (four) hours as needed.  . polyethylene glycol (MIRALAX / GLYCOLAX) packet Take 17 g by mouth daily.  . sertraline (ZOLOFT) 100 MG tablet Take 2 tablets (200 mg total) by mouth at bedtime. KEEP 11/20/2017 appointment for further refills  . [DISCONTINUED] albuterol (PROVENTIL HFA;VENTOLIN HFA) 108 (90 BASE) MCG/ACT inhaler Inhale 2 puffs into the lungs every 6 (six) hours as needed for wheezing or shortness of breath. (Patient not taking: Reported on 04/05/2015)   No facility-administered encounter medications on file as of 11/20/2017.    In your present state of health, do you have any difficulty performing the following activities: 11/20/2017  Hearing? N  Vision? N  Difficulty concentrating or making decisions? N  Walking or climbing stairs? N  Dressing or bathing? N  Doing errands, shopping? N  Preparing Food and eating ? N  Using the Toilet? N  In the past six months, have you accidently leaked urine? N  Do you have problems with loss of bowel control? N  Managing your Medications? N  Managing your Finances? N  Housekeeping or managing your Housekeeping? N  Some recent data might be hidden    Patient Care Team: Hoyt Koch, MD as PCP - General (Internal Medicine) Inda Castle, MD (Gastroenterology) Nicholaus Bloom, MD (Anesthesiology)    Assessment:    Physical assessment deferred to PCP.  Exercise Activities and Dietary recommendations Current Exercise Habits: The patient does not participate in regular exercise at present, Exercise limited by: orthopedic condition(s)  Diet (meal preparation, eat out, water intake, caffeinated beverages, dairy products, fruits and vegetables): in general, a "healthy" diet     Reviewed heart healthy diet, encouraged patient to increase daily water intake and to  decrease the amount of soft drinks daily.  Goals      Patient Stated   . patient (pt-stated)     Will explore mindfulness; and yoga Dr. Truitt Leep;  Address: [address], [place], [place] [address]  Phone: [phone]       Other   . Patient Stated     I want to continue to learn how to speak Spanish, continue to read my new bible and do bible study.       Fall Risk Fall Risk  11/20/2017 11/18/2016 10/04/2015  Falls in the past year? Yes No Yes  Number falls in past yr: 1 - 1  Injury with Fall? Yes - -  Follow up - - Education provided  Comment - -  hurt knees and top of foot/ knees hurt;     Depression Screen PHQ 2/9 Scores 11/20/2017 11/20/2017 11/18/2016 10/04/2015  PHQ - 2 Score 6 2 0 0  PHQ- 9 Score 14 - - -     Cognitive Function MMSE - Mini Mental State Exam 11/20/2017 10/04/2015  Not completed: - (No Data)  Orientation to time 5 -  Orientation to Place 5 -  Registration 3 -  Attention/ Calculation 5 -  Recall 2 -  Language- name 2 objects 2 -  Language- repeat 1 -  Language- follow 3 step command 3 -  Language- read & follow direction 1 -  Write a sentence 1 -  Copy design 1 -  Total score 29 -        Immunization History  Administered Date(s) Administered  . Influenza,inj,Quad PF,6+ Mos 09/29/2014, 10/04/2015  . Influenza-Unspecified 09/15/2011, 09/22/2013, 09/09/2016  . Pneumococcal Conjugate-13 10/04/2015  . Pneumococcal Polysaccharide-23 04/17/2014  . Td 01/23/2005  . Tdap 05/16/2016   Screening Tests Health Maintenance  Topic Date Due  . INFLUENZA VACCINE  07/23/2018 (Originally 07/22/2017)  . MAMMOGRAM  11/20/2018 (Originally 04/22/2015)  . COLONOSCOPY  03/22/2019  . TETANUS/TDAP  05/16/2026  . DEXA SCAN  Completed  . Hepatitis C Screening  Completed  . PNA vac Low Risk Adult  Completed       Plan:  Continue doing brain stimulating activities (puzzles, reading, adult coloring books, staying active) to keep memory sharp.     I have personally reviewed and noted the following in the patient's chart:   . Medical and social history . Use of alcohol, tobacco or illicit drugs  . Current medications and supplements . Functional ability and status . Nutritional status . Physical activity . Advanced directives . List of other physicians . Vitals . Screenings to include cognitive, depression, and falls . Referrals and appointments  In addition, I have reviewed and discussed with patient certain preventive protocols, quality metrics, and best practice recommendations. A written personalized care plan for preventive services as well as general preventive health recommendations were provided to patient.     Michiel Cowboy, RN  11/20/2017

## 2017-11-20 NOTE — Patient Instructions (Addendum)
Continue doing brain stimulating activities (puzzles, reading, adult coloring books, staying active) to keep memory sharp.   Ms. Koerner , Thank you for taking time to come for your Medicare Wellness Visit. I appreciate your ongoing commitment to your health goals. Please review the following plan we discussed and let me know if I can assist you in the future.   These are the goals we discussed: Goals      Patient Stated   . patient (pt-stated)     Will explore mindfulness; and yoga Dr. Truitt Leep;  Address: [address], [place], [place] [address]  Phone: [phone]       Other   . Patient Stated     I want to continue to learn how to speak Spanish and continue to read my new bible and do bible study.        This is a list of the screening recommended for you and due dates:  Health Maintenance  Topic Date Due  . Flu Shot  07/23/2018*  . Mammogram  11/20/2018*  . Colon Cancer Screening  03/22/2019  . Tetanus Vaccine  05/16/2026  . DEXA scan (bone density measurement)  Completed  .  Hepatitis C: One time screening is recommended by Center for Disease Control  (CDC) for  adults born from [birth year] through 1963.   Completed  . Pneumonia vaccines  Completed  *Topic was postponed. The date shown is not the original due date.

## 2017-11-20 NOTE — Assessment & Plan Note (Signed)
Checking lipid panel and adjust as needed. Taking lipitor only 3-4 times per week.

## 2017-12-20 ENCOUNTER — Other Ambulatory Visit: Payer: Self-pay | Admitting: Internal Medicine

## 2018-01-03 ENCOUNTER — Encounter: Payer: Self-pay | Admitting: Internal Medicine

## 2018-01-06 DIAGNOSIS — M797 Fibromyalgia: Secondary | ICD-10-CM | POA: Diagnosis not present

## 2018-01-06 DIAGNOSIS — M62830 Muscle spasm of back: Secondary | ICD-10-CM | POA: Diagnosis not present

## 2018-01-06 DIAGNOSIS — Z79891 Long term (current) use of opiate analgesic: Secondary | ICD-10-CM | POA: Diagnosis not present

## 2018-01-06 DIAGNOSIS — G894 Chronic pain syndrome: Secondary | ICD-10-CM | POA: Diagnosis not present

## 2018-01-21 ENCOUNTER — Encounter: Payer: Self-pay | Admitting: Internal Medicine

## 2018-01-21 ENCOUNTER — Ambulatory Visit: Payer: Medicare Other | Admitting: Internal Medicine

## 2018-01-21 VITALS — BP 126/70 | HR 97 | Ht 66.5 in | Wt 224.6 lb

## 2018-01-21 DIAGNOSIS — G4733 Obstructive sleep apnea (adult) (pediatric): Secondary | ICD-10-CM | POA: Insufficient documentation

## 2018-01-21 DIAGNOSIS — G47419 Narcolepsy without cataplexy: Secondary | ICD-10-CM | POA: Diagnosis not present

## 2018-01-21 NOTE — Progress Notes (Signed)
01/21/18-72 year old female former smoker for sleep evaluation.Sleep Consult; Self referral Pt had sleep study years ago and unsure when it was. Currently on CPAP at times through [employer] and needs new one.  Medical problem list includes COPD Gold II, allergic rhinitis, GERD, hypothyroid, morbid obesity, HBP, She states she is sleeping 24-36 hours at a time sometimes or cannot get to sleep at all.  Normally sleeps 12-16 hours.  When her old CPAP machine worked better, she says excessive sleepiness was much less of a problem.  She complains of "no energy" but I had trouble telling if she really meant sleepiness.  Sleeps alone with no witness but told in the past that she snored.  Admits some "sadness" after recent deaths of family members, but denies serious depression.  Does not admit to either cataplexy or sleep paralysis by my description.  In the remote past she took Dexedrine 10 mg twice daily for original diagnosis of narcolepsy made about 30 years ago without objective testing. ENT surgery-tonsils.  She denies parasomnias but sometimes moves her leg at night because of sore knee. Now smoking Vapes with nicotine. Chronic pain attributed to arthritis and fibromyalgia.  We discussed possible fatiguing effects of pain meds. Epworth score 9  Prior to Admission medications   Medication Sig Start Date End Date Taking? Authorizing Provider  atorvastatin (LIPITOR) 20 MG tablet TAKE 1 TABLET BY MOUTH  EVERY OTHER DAY 07/13/17  Yes Golden Circle, FNP  Cholecalciferol (VITAMIN D-3) 1000 UNITS CAPS Take by mouth.   Yes [provider]  fluocinonide cream (LIDEX) 0.05 % Apply topically as needed. 12/13/15  Yes Hoyt Koch, MD  levothyroxine (SYNTHROID, LEVOTHROID) 75 MCG tablet TAKE 1 TABLET BY MOUTH  DAILY. KEEP 11/20/2017  APPOINTMENT FOR FURTHER  REFILLS 12/21/17  Yes Hoyt Koch, MD  omeprazole (PRILOSEC) 40 MG capsule Take 1 capsule (40 mg total) by mouth daily.  KEEP 11/20/2017 appointment for further refills 10/12/17  Yes Hoyt Koch, MD  oxyCODONE-acetaminophen (PERCOCET) 10-325 MG tablet Take 1 tablet by mouth every 4 (four) hours as needed. 11/02/15  Yes Hoyt Koch, MD  polyethylene glycol [facility] / GLYCOLAX) packet Take 17 g by mouth daily.   Yes [provider]  sertraline (ZOLOFT) 100 MG tablet Take 2 tablets (200 mg total) by mouth at bedtime. KEEP 11/20/2017 appointment for further refills 10/12/17  Yes Hoyt Koch, MD   Past Medical History:  Diagnosis Date  . Allergic rhinitis   . Arthritis   . Bursitis    Both Shoulders  . Chronic low back pain   . Degenerative disk disease   . Depression   . Diverticulitis   . Emphysema of lung ([place])   . GERD (gastroesophageal reflux disease)   . History of fibromyalgia   . Hypertension   . Hypothyroidism   . Irritable bowel syndrome   . Neuralgia, post-herpetic   . Obesity, unspecified   . Obstructive sleep apnea   . OCD (obsessive compulsive disorder)   . Pain in joint, site unspecified   . Pure hypercholesterolemia   . Scoliosis   . Situational stress   . Spastic colon    Past Surgical History:  Procedure Laterality Date  . BREAST LUMPECTOMY     knot removed  . CHOLECYSTECTOMY    . HYSTEROTOMY    . TONSILLECTOMY    . tumor in left forearm     Family History  Problem Relation Age of Onset  . Stomach cancer Father   .  Liver cancer Father   . Diabetes Father   . Colon cancer Neg Hx    Social History   Socioeconomic History  . Marital status: Divorced    Spouse name: Not on file  . Number of children: Not on file  . Years of education: Not on file  . Highest education level: Not on file  Social Needs  . Financial resource strain: Not on file  . Food insecurity - worry: Not on file  . Food insecurity - inability: Not on file  . Transportation needs - medical: Not on file  . Transportation needs - non-medical: Not on file   Occupational History  . Not on file  Tobacco Use  . Smoking status: Former Smoker    Packs/day: 2.00    Years: 34.00    Pack years: 68.00    Types: Cigarettes    Last attempt to quit: 12/15/1996    Years since quitting: 21.1  . Smokeless tobacco: Never Used  . Tobacco comment: vapes x 2 years  Substance and Sexual Activity  . Alcohol use: No    Alcohol/week: 0.0 oz    Comment: Caffine Intake 2x's daily  . Drug use: No  . Sexual activity: Not on file  Other Topics Concern  . Not on file  Social History Narrative  . Not on file   ROS-see HPI   + = positive Constitutional:    weight loss, night sweats, fevers, chills, fatigue, lassitude. HEENT:    headaches, difficulty swallowing, tooth/dental problems, sore throat,       sneezing, itching, ear ache, nasal congestion, post nasal drip, snoring CV:    chest pain, orthopnea, PND, swelling in lower extremities, anasarca,                                                 dizziness, palpitations Resp:   shortness of breath with exertion or at rest.                productive cough,   non-productive cough, coughing up of blood.              change in color of mucus.  wheezing.   Skin:    rash or lesions. GI:  No-   heartburn, indigestion, abdominal pain, nausea, vomiting, diarrhea,                 change in bowel habits, loss of appetite GU: dysuria, change in color of urine, no urgency or frequency.   flank pain. MS:   joint pain, stiffness, decreased range of motion, back pain. Neuro-     nothing unusual Psych:  change in mood or affect.  depression or anxiety.   memory loss.  OBJ- Physical Exam General- Alert, Oriented, Affect-appropriate, Distress- none acute Skin- rash-none, lesions- none, excoriation- none Lymphadenopathy- none Head- atraumatic            Eyes- Gross vision intact, PERRLA, conjunctivae and secretions clear            Ears- Hearing, canals-normal            Nose- Clear, no-Septal dev, mucus, polyps, erosion,  perforation             Throat- Mallampati II-III , mucosa clear , drainage- none, tonsils- atrophic Neck- flexible , trachea midline, no stridor , thyroid nl, carotid no  bruit Chest - symmetrical excursion , unlabored           Heart/CV- RRR , no murmur , no gallop  , no rub, nl s1 s2                           - JVD- none , edema- none, stasis changes- none, varices- none           Lung- clear to P&A, wheeze- none, cough- none , dullness-none, rub- none           Chest wall-  Abd-  Br/ Gen/ Rectal- Not done, not indicated Extrem- cyanosis- none, clubbing, none, atrophy- none, strength- nl, + Cane Neuro- grossly intact to observation

## 2018-01-21 NOTE — Patient Instructions (Signed)
Order- please schedule unattended home sleep test    Dx OSA   Please call me about 2 weeks after your sleep test to ask for results and recommendations.

## 2018-01-21 NOTE — Assessment & Plan Note (Signed)
If we cannot establish a significant diagnosis of OSA and relieve her symptoms with CPAP, then we will need to consider if full formal evaluation would be appropriate, NPSG/MSLT. She is not eager to do this.Marland Kitchen

## 2018-01-21 NOTE — Assessment & Plan Note (Signed)
Remote sleep study no longer available but she qualified for CPAP.  Her description of long sleep periods is not characteristic of OSA and she may need further testing.  We are going to try to establish the diagnosis initially with an overnight sleep study, then Restarted and see if it provides the symptomatic benefit that she remembers from before. Plan-sleep study call me for results

## 2018-02-14 ENCOUNTER — Other Ambulatory Visit: Payer: Self-pay | Admitting: Internal Medicine

## 2018-02-23 DIAGNOSIS — R0683 Snoring: Secondary | ICD-10-CM | POA: Diagnosis not present

## 2018-02-23 DIAGNOSIS — G4733 Obstructive sleep apnea (adult) (pediatric): Secondary | ICD-10-CM | POA: Diagnosis not present

## 2018-02-24 ENCOUNTER — Other Ambulatory Visit: Payer: Self-pay | Admitting: *Deleted

## 2018-02-24 DIAGNOSIS — G4733 Obstructive sleep apnea (adult) (pediatric): Secondary | ICD-10-CM

## 2018-02-24 DIAGNOSIS — R0683 Snoring: Secondary | ICD-10-CM | POA: Diagnosis not present

## 2018-03-02 DIAGNOSIS — R0683 Snoring: Secondary | ICD-10-CM | POA: Diagnosis not present

## 2018-03-09 DIAGNOSIS — Z79891 Long term (current) use of opiate analgesic: Secondary | ICD-10-CM | POA: Diagnosis not present

## 2018-03-09 DIAGNOSIS — G894 Chronic pain syndrome: Secondary | ICD-10-CM | POA: Diagnosis not present

## 2018-03-09 DIAGNOSIS — M797 Fibromyalgia: Secondary | ICD-10-CM | POA: Diagnosis not present

## 2018-03-09 DIAGNOSIS — M62830 Muscle spasm of back: Secondary | ICD-10-CM | POA: Diagnosis not present

## 2018-03-17 ENCOUNTER — Telehealth: Payer: Self-pay | Admitting: Internal Medicine

## 2018-03-17 NOTE — Telephone Encounter (Signed)
CY, patient is asking for the results of her HST test.   Please advise, thanks!

## 2018-03-17 NOTE — Telephone Encounter (Signed)
Called and spoke with pt letting her know the results of her sleep study and per CY, pt could cancel her f/u appt if she wanted to.  Pt stated to me she would still like to keep her f/u appt with CY due to still sleeping a lot averaging 24-36 hours per pt.  I expressed understanding. Nothing further needed at this time.

## 2018-03-17 NOTE — Telephone Encounter (Signed)
Her home sleep test was within normal limits. She averaged 4.9 apneas/ hour and the upper limit of normal is 5/ hour.  We usually advise people to maintain normal weight, sleep off the flat of their backs so they breathe more freely, and maintain good sleep habits. Ok to cancel the return appointment unless she wants to come and discuss her sleep study.

## 2018-04-14 ENCOUNTER — Telehealth: Payer: Self-pay | Admitting: Internal Medicine

## 2018-04-14 NOTE — Telephone Encounter (Unsigned)
Copied from [place]. Topic: Quick Communication - Rx Refill/Question >> Apr 14, 2018  3:10 PM Carolyn Stare wrote: Medication  atorvastatin (LIPITOR) 20 MG tablet   Has the patient contacted their pharmacy yes    Preferred Pharmacy  [facility]  Agent: Please be advised that RX refills may take up to 3 business days. We ask that you follow-up with your pharmacy.

## 2018-04-16 MED ORDER — ATORVASTATIN CALCIUM 20 MG PO TABS: 20.0000 mg | ORAL_TABLET | ORAL | 1 refills | Status: DC

## 2018-04-19 ENCOUNTER — Ambulatory Visit: Payer: Medicare Other | Admitting: Internal Medicine

## 2018-04-19 ENCOUNTER — Encounter: Payer: Self-pay | Admitting: Internal Medicine

## 2018-04-19 DIAGNOSIS — G4733 Obstructive sleep apnea (adult) (pediatric): Secondary | ICD-10-CM | POA: Diagnosis not present

## 2018-04-19 DIAGNOSIS — G47419 Narcolepsy without cataplexy: Secondary | ICD-10-CM | POA: Diagnosis not present

## 2018-04-19 MED ORDER — AMPHETAMINE-DEXTROAMPHET ER 15 MG PO CP24: 15.0000 mg | ORAL_CAPSULE | ORAL | 0 refills | Status: DC

## 2018-04-19 NOTE — Progress Notes (Signed)
HPI female former smoker followed for OSA/hypersomnia complicated by COPD Gold 2, allergic rhinitis, GERD, hypothyroid, morbid obesity, HBP HST-02/23/2018-AHI 4.9/hour, desaturation to 83%, body weight 224 pounds -----------------------------------------------------------------------------------  01/21/18-72 year old female former smoker for sleep evaluation.Sleep Consult; Self referral Pt had sleep study years ago and unsure when it was. Currently on CPAP at times through [employer] and needs new one.  Medical problem list includes COPD Gold II, allergic rhinitis, GERD, hypothyroid, morbid obesity, HBP, She states she is sleeping 24-36 hours at a time sometimes or cannot get to sleep at all.  Normally sleeps 12-16 hours.  When her old CPAP machine worked better, she says excessive sleepiness was much less of a problem.  She complains of "no energy" but I had trouble telling if she really meant sleepiness.  Sleeps alone with no witness but told in the past that she snored.  Admits some "sadness" after recent deaths of family members, but denies serious depression.  Does not admit to either cataplexy or sleep paralysis by my description.  In the remote past she took Dexedrine 10 mg twice daily for original diagnosis of narcolepsy made about 30 years ago without objective testing. ENT surgery-tonsils.  She denies parasomnias but sometimes moves her leg at night because of sore knee. Now smoking Vapes with nicotine. Chronic pain attributed to arthritis and fibromyalgia.  We discussed possible fatiguing effects of pain meds. Epworth score 9  04/19/2018-72 year old female former smoker followed for OSA/hypersomnia complicated by COPD Gold 2, allergic rhinitis, GERD, hypothyroid, morbid obesity, HBP HST-02/23/2018-AHI 4.9/hour, desaturation to 83%, body weight 224 pounds ----Still sleep alot. f/u sleep study. Zoloft, Synthroid, Oxycodone,  Has complained of excessive daytime sleepiness for many years  as reviewed previously, without cataplexy or sleep paralysis.  No history of seizure or significant head trauma.  "30 or 40 years ago" she tried Ritalin, Adderall and ended up on Dexedrine.  She thinks she is getting enough sleep at night and says she sleeps 10 to 15 hours a day.  She is willing to try again with stimulants because she thinks she has been getting more sleepy over the last 8 or 9 months.  She does not want to have to do formal NPSG/MSLT.  ROS-see HPI   + = positive Constitutional:    weight loss, night sweats, fevers, chills,+ fatigue, lassitude. HEENT:    headaches, difficulty swallowing, tooth/dental problems, sore throat,       sneezing, itching, ear ache, nasal congestion, post nasal drip, snoring CV:    chest pain, orthopnea, PND, swelling in lower extremities, anasarca,                                                 dizziness, palpitations Resp:   shortness of breath with exertion or at rest.                productive cough,   non-productive cough, coughing up of blood.              change in color of mucus.  wheezing.   Skin:    rash or lesions. GI:  No-   heartburn, indigestion, abdominal pain, nausea, vomiting, diarrhea,                 change in bowel habits, loss of appetite GU: dysuria, change in color of urine, no urgency or frequency.  flank pain. MS:   joint pain, stiffness, decreased range of motion, back pain. Neuro-     nothing unusual Psych:  change in mood or affect.  depression or anxiety.   memory loss.  OBJ- Physical Exam General- Alert, Oriented, Affect-appropriate, Distress- none acute Skin- rash-none, lesions- none, excoriation- none Lymphadenopathy- none Head- atraumatic            Eyes- Gross vision intact, PERRLA, conjunctivae and secretions clear            Ears- Hearing, canals-normal            Nose- Clear, no-Septal dev, mucus, polyps, erosion, perforation             Throat- Mallampati II-III , mucosa clear , drainage- none, tonsils-  atrophic Neck- flexible , trachea midline, no stridor , thyroid nl, carotid no bruit Chest - symmetrical excursion , unlabored           Heart/CV- RRR , no murmur , no gallop  , no rub, nl s1 s2                           - JVD- none , edema- none, stasis changes- none, varices- none           Lung- clear to P&A, wheeze- none, cough- none , dullness-none, rub- none           Chest wall-  Abd-  Br/ Gen/ Rectal- Not done, not indicated Extrem- cyanosis- none, clubbing, none, atrophy- none, strength- nl, + Cane Neuro- grossly intact to observation

## 2018-04-19 NOTE — Patient Instructions (Signed)
Script printed to try Adderall 15 mg extended release tab, once in the morning as needed  Please call as needed

## 2018-04-20 NOTE — Assessment & Plan Note (Signed)
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  Appropriate education done with emphasis on good sleep hygiene, naps when possible, responsibility to be alert and awake while driving. Plan-try Adderall 15 mg extended release with discussion done.

## 2018-04-20 NOTE — Assessment & Plan Note (Signed)
Problems excluded by recent sleep study and will be resolved.

## 2018-04-28 ENCOUNTER — Telehealth: Payer: Self-pay | Admitting: Internal Medicine

## 2018-04-28 NOTE — Telephone Encounter (Signed)
We need to understand more clearly. Is the adderall simply not strong enough, or is it doing something she doesn't like?

## 2018-04-28 NOTE — Telephone Encounter (Signed)
Spoke with pt. States that Adderall XR 15mg  is not working well for her. She is requesting an alternative.  Dr. Annamaria Boots - please advise. Thanks.

## 2018-04-29 NOTE — Telephone Encounter (Signed)
Called and spoke to pt. Informed her of the recs per CY. Pt verbalized understanding and denied any further questions or concerns at this time.   

## 2018-04-29 NOTE — Telephone Encounter (Signed)
Ok to try taking 2 of her 15 mg tabs, 1 hour apart, each morning. See how that does with the supply she has left.

## 2018-04-29 NOTE — Telephone Encounter (Signed)
Spoke with patient-states the medication is not strong enough. Pt stated she went to take a nap on Sunday afternoon/evening around 4:30pm and did not wake up until Tuesday afternoon. Pt denies any new symptoms, medications, or illnesses.   CY Please advise.

## 2018-05-11 DIAGNOSIS — M797 Fibromyalgia: Secondary | ICD-10-CM | POA: Diagnosis not present

## 2018-05-11 DIAGNOSIS — M62830 Muscle spasm of back: Secondary | ICD-10-CM | POA: Diagnosis not present

## 2018-05-11 DIAGNOSIS — G894 Chronic pain syndrome: Secondary | ICD-10-CM | POA: Diagnosis not present

## 2018-05-11 DIAGNOSIS — Z79891 Long term (current) use of opiate analgesic: Secondary | ICD-10-CM | POA: Diagnosis not present

## 2018-05-18 ENCOUNTER — Ambulatory Visit: Payer: Medicare Other | Admitting: Internal Medicine

## 2018-05-21 ENCOUNTER — Ambulatory Visit: Payer: Medicare Other | Admitting: Internal Medicine

## 2018-05-28 DIAGNOSIS — H524 Presbyopia: Secondary | ICD-10-CM | POA: Diagnosis not present

## 2018-05-28 DIAGNOSIS — H5203 Hypermetropia, bilateral: Secondary | ICD-10-CM | POA: Diagnosis not present

## 2018-05-28 DIAGNOSIS — H2513 Age-related nuclear cataract, bilateral: Secondary | ICD-10-CM | POA: Diagnosis not present

## 2018-05-31 ENCOUNTER — Ambulatory Visit: Payer: Medicare Other | Admitting: Internal Medicine

## 2018-05-31 ENCOUNTER — Encounter: Payer: Self-pay | Admitting: Internal Medicine

## 2018-05-31 DIAGNOSIS — J449 Chronic obstructive pulmonary disease, unspecified: Secondary | ICD-10-CM | POA: Diagnosis not present

## 2018-05-31 DIAGNOSIS — G47419 Narcolepsy without cataplexy: Secondary | ICD-10-CM

## 2018-05-31 MED ORDER — MODAFINIL 200 MG PO TABS: 200.0000 mg | ORAL_TABLET | Freq: Every day | ORAL | 5 refills | Status: DC

## 2018-05-31 NOTE — Progress Notes (Signed)
HPI female former smoker followed for OSA/hypersomnia complicated by COPD Gold 2, allergic rhinitis, GERD, hypothyroid, morbid obesity, HBP HST-02/23/2018-AHI 4.9/hour, desaturation to 83%, body weight 224 pounds -----------------------------------------------------------------------------------  04/19/2018-72 year old female former smoker followed for OSA/hypersomnia complicated by COPD Gold 2, allergic rhinitis, GERD, hypothyroid, morbid obesity, HBP HST-02/23/2018-AHI 4.9/hour, desaturation to 83%, body weight 224 pounds ----Still sleep alot. f/u sleep study. Zoloft, Synthroid, Oxycodone,  Has complained of excessive daytime sleepiness for many years as reviewed previously, without cataplexy or sleep paralysis.  No history of seizure or significant head trauma.  "30 or 40 years ago" she tried Ritalin, Adderall and ended up on Dexedrine.  She thinks she is getting enough sleep at night and says she sleeps 10 to 15 hours a day.  She is willing to try again with stimulants because she thinks she has been getting more sleepy over the last 8 or 9 months.  She does not want to have to do formal NPSG/MSLT.  05/31/2018- 72 year old female former smoker followed for Hypersomnia complicated by COPD Gold 2, allergic rhinitis, GERD, hypothyroid, morbid obesity, HBP ----Hypersomnia; Pt states she is unable tolerate dosing of Adderall 15 XR-states 1 tab QD is not enought to keep alert and 2 tabs QD makes her have stomach issues with diarrhea.  She says 30 mg daily help her mind stay more clear but did not prevent daytime sleepiness.  She still is sleeping up to 16 hours daily. Breathing has been stable.  Little cough or wheeze and no acute exacerbation.  ROS-see HPI   + = positive Constitutional:    weight loss, night sweats, fevers, chills,+ fatigue, lassitude. HEENT:    headaches, difficulty swallowing, tooth/dental problems, sore throat,       sneezing, itching, ear ache, nasal congestion, post nasal drip,  snoring CV:    chest pain, orthopnea, PND, swelling in lower extremities, anasarca,                                                 dizziness, palpitations Resp:   shortness of breath with exertion or at rest.                productive cough,   non-productive cough, coughing up of blood.              change in color of mucus.  wheezing.   Skin:    rash or lesions. GI:  No-   heartburn, indigestion, abdominal pain, nausea, vomiting, diarrhea,                 change in bowel habits, loss of appetite GU: dysuria, change in color of urine, no urgency or frequency.   flank pain. MS:   joint pain, stiffness, decreased range of motion, back pain. Neuro-     HPI Psych:  change in mood or affect.  depression or anxiety.   memory loss.  OBJ- Physical Exam General- Alert, Oriented, Affect-appropriate, Distress- none acute Skin- rash-none, lesions- none, excoriation- none Lymphadenopathy- none Head- atraumatic            Eyes- Gross vision intact, PERRLA, conjunctivae and secretions clear            Ears- Hearing, canals-normal            Nose- Clear, no-Septal dev, mucus, polyps, erosion, perforation  Throat- Mallampati II-III , mucosa clear , drainage- none, tonsils- atrophic Neck- flexible , trachea midline, no stridor , thyroid nl, carotid no bruit Chest - symmetrical excursion , unlabored           Heart/CV- RRR , no murmur , no gallop  , no rub, nl s1 s2                           - JVD- none , edema- none, stasis changes- none, varices- none           Lung- clear to P&A, wheeze- none, cough- none , dullness-none, rub- none           Chest wall-  Abd-  Br/ Gen/ Rectal- Not done, not indicated Extrem- cyanosis- none, clubbing, none, atrophy- none, strength- nl, + Cane Neuro- grossly intact to observation

## 2018-05-31 NOTE — Patient Instructions (Addendum)
We will try modafinil for alertness.- 1/2 or 1 tab each morning as needed.  My office will request a prior authorization if your drug store indicates it is required.  As discussed, we can come back to a trial of dexedrine if modafinil doesn't work out.

## 2018-06-01 ENCOUNTER — Telehealth: Payer: Self-pay | Admitting: Internal Medicine

## 2018-06-01 NOTE — Assessment & Plan Note (Signed)
Symptomatically stable without exacerbation. Plan-continue current management.

## 2018-06-01 NOTE — Assessment & Plan Note (Signed)
Affect and alertness seem normal today although she says she spent most of the last 24 hours in bed sleeping.  She does not want to do formal sleep testing.  I think this is at least partly financial.  She recalls doing well with Dexedrine many years ago.  I discussed controlled medications and stimulants.  I am willing to let her try modafinil first.

## 2018-06-01 NOTE — Telephone Encounter (Signed)
Called patient, unable to reach left message to give us a call back. 

## 2018-06-02 ENCOUNTER — Telehealth: Payer: Self-pay | Admitting: Internal Medicine

## 2018-06-02 NOTE — Telephone Encounter (Signed)
Received a fax from [employer]. Pt's insurance is not cover Modafinil. Called [facility] at [phone]. Pt's ID: [id number]. Spoke with MJ. PA was initiated and approved through 12/21/18. Nothing further was needed.

## 2018-06-03 NOTE — Telephone Encounter (Signed)
Spoke with pt. She states that she was having issues getting Modafinil. Advised her that a PA was completed yesterday and it has been approved. Nothing further was needed.

## 2018-06-10 ENCOUNTER — Telehealth: Payer: Self-pay | Admitting: Internal Medicine

## 2018-06-10 MED ORDER — DEXTROAMPHETAMINE SULFATE ER 10 MG PO CP24: ORAL_CAPSULE | ORAL | 0 refills | Status: DC

## 2018-06-10 NOTE — Telephone Encounter (Signed)
RX has been placed in mail for patient.

## 2018-06-10 NOTE — Telephone Encounter (Signed)
Offer dexedrine 10 mg, # 30   1 or 2 daily as needed

## 2018-06-10 NOTE — Telephone Encounter (Signed)
Called and spoke with Joanne Klein stating the information to her per CY.  Joanne Klein stated the modafinil has not really been helping her, in fact, she was about to go back to sleep.  Joanne Klein stated she is going to discontinue taking the modafinil altogether due to it not helping and is wanting to know if there is another med that could be recommended instead.  Dr. Annamaria Boots, please advise.  Thanks!  Allergies  Allergen Reactions  . Codeine   . Morphine And Related   . Prozac [Fluoxetine Hcl]   . Sulfa Antibiotics      Current Outpatient Medications:  .  atorvastatin (LIPITOR) 20 MG tablet, Take 1 tablet (20 mg total) by mouth every other day., Disp: 45 tablet, Rfl: 1 .  Cholecalciferol (VITAMIN D-3) 1000 UNITS CAPS, Take by mouth., Disp: , Rfl:  .  fluocinonide cream (LIDEX) 0.05 %, Apply topically as needed., Disp: 30 g, Rfl: 3 .  levothyroxine (SYNTHROID, LEVOTHROID) 75 MCG tablet, TAKE 1 TABLET BY MOUTH  DAILY. KEEP 11/20/2017  APPOINTMENT FOR FURTHER  REFILLS, Disp: 90 tablet, Rfl: 3 .  modafinil (PROVIGIL) 200 MG tablet, Take 1 tablet (200 mg total) by mouth daily., Disp: 30 tablet, Rfl: 5 .  omeprazole (PRILOSEC) 40 MG capsule, TAKE 1 CAPSULE BY MOUTH  DAILY, Disp: 90 capsule, Rfl: 3 .  oxyCODONE-acetaminophen (PERCOCET) 10-325 MG tablet, Take 1 tablet by mouth every 4 (four) hours as needed., Disp: 180 tablet, Rfl: 0 .  polyethylene glycol (MIRALAX / GLYCOLAX) packet, Take 17 g by mouth daily., Disp: , Rfl:  .  sertraline (ZOLOFT) 100 MG tablet, TAKE 2 TABLETS BY MOUTH AT  BEDTIME, Disp: 180 tablet, Rfl: 3

## 2018-06-10 NOTE — Telephone Encounter (Signed)
Spoke with pt. States that she is having a reaction to Modafinil. Reports having sores on the inside lining of her mouth. States that these showed up after she started taking the new medication. I have instructed her to not take anymore of the medication until she hears back from [place].  Dr. Annamaria Boots - please advise. Thanks.

## 2018-06-10 NOTE — Telephone Encounter (Signed)
Called and spoke with pt letting her know the med CY stated we could change her to to see if it would help better.   Pt expressed understanding and stated if med could not be sent electronically to her pharmacy to mail it to her address on file.  Script was printed and has been placed on CY's cart for him to sign. Once it is returned back to triage, will place it in mail for pt.  Nothing further needed.

## 2018-06-10 NOTE — Telephone Encounter (Signed)
If the modafinil was helping enough to continue, then suggest she drop it for a few days, then try it again. The sores may not be from the medication.

## 2018-06-16 ENCOUNTER — Encounter: Payer: Self-pay | Admitting: Internal Medicine

## 2018-06-16 NOTE — Telephone Encounter (Signed)
Error

## 2018-07-01 ENCOUNTER — Telehealth: Payer: Self-pay | Admitting: Internal Medicine

## 2018-07-01 MED ORDER — DEXTROAMPHETAMINE SULFATE ER 10 MG PO CP24: ORAL_CAPSULE | ORAL | 0 refills | Status: DC

## 2018-07-01 NOTE — Telephone Encounter (Signed)
Ok to change to # 60, 2 daily   ) ref  She can take together or space out

## 2018-07-01 NOTE — Telephone Encounter (Signed)
Spoke with pt. She is needing a refill on Dexedrine. Her current prescription is for 1-2 tablets daily prn #30. Pt is wanting this to be changed to 2 tablets daily prn #60. She would also like to know if this is changed, can she take both tablets at the same time or does she need to space them out?  CY - please advise. Thanks.

## 2018-07-01 NOTE — Telephone Encounter (Signed)
Called spoke with patient Joanne Klein the Rx change Patient voiced her understanding Patient would like the Rx mailed to her, rather than picking it up Verified patient's home mailing address  Rx printed, taken to [facility] Rx signed and placed in the mail  Nothing further needed at this time; will sign off

## 2018-07-19 DIAGNOSIS — M797 Fibromyalgia: Secondary | ICD-10-CM | POA: Diagnosis not present

## 2018-07-19 DIAGNOSIS — Z79891 Long term (current) use of opiate analgesic: Secondary | ICD-10-CM | POA: Diagnosis not present

## 2018-07-19 DIAGNOSIS — G894 Chronic pain syndrome: Secondary | ICD-10-CM | POA: Diagnosis not present

## 2018-07-19 DIAGNOSIS — M47812 Spondylosis without myelopathy or radiculopathy, cervical region: Secondary | ICD-10-CM | POA: Diagnosis not present

## 2018-08-02 ENCOUNTER — Ambulatory Visit: Payer: Medicare Other | Admitting: Internal Medicine

## 2018-08-06 ENCOUNTER — Other Ambulatory Visit: Payer: Self-pay

## 2018-08-06 NOTE — Patient Outreach (Signed)
[facility]) Care Management  08/06/2018  LATRICIA CERRITO 11/02/1946 [id number]   Medication Adherence call to Mrs. Joanne Klein patient is showing past due on Atorvastatin 20 mg spoke with patient she said she is expecting a mail order to be deliver for her in a couple of days.[name] is showing past due under [place].   [place] Management Direct Dial [phone]  Fax [phone] [email or web address]

## 2018-08-11 ENCOUNTER — Telehealth: Payer: Self-pay | Admitting: Internal Medicine

## 2018-08-11 NOTE — Telephone Encounter (Signed)
Received parking placard form via mail for a renewal. Form has been completed & placed in providers box to review and sign.

## 2018-08-24 NOTE — Telephone Encounter (Signed)
Signed, Copy sent to scan, & Original to patient.

## 2018-09-26 ENCOUNTER — Other Ambulatory Visit: Payer: Self-pay | Admitting: Internal Medicine

## 2018-10-08 ENCOUNTER — Other Ambulatory Visit: Payer: Self-pay | Admitting: Internal Medicine

## 2018-10-11 DIAGNOSIS — M797 Fibromyalgia: Secondary | ICD-10-CM | POA: Diagnosis not present

## 2018-10-11 DIAGNOSIS — G894 Chronic pain syndrome: Secondary | ICD-10-CM | POA: Diagnosis not present

## 2018-10-11 DIAGNOSIS — Z79891 Long term (current) use of opiate analgesic: Secondary | ICD-10-CM | POA: Diagnosis not present

## 2018-10-11 DIAGNOSIS — M47812 Spondylosis without myelopathy or radiculopathy, cervical region: Secondary | ICD-10-CM | POA: Diagnosis not present

## 2018-11-22 ENCOUNTER — Encounter: Payer: Medicare Other | Admitting: Internal Medicine

## 2018-11-23 ENCOUNTER — Other Ambulatory Visit: Payer: Self-pay

## 2018-11-23 ENCOUNTER — Encounter: Payer: Medicare Other | Admitting: Internal Medicine

## 2018-11-23 DIAGNOSIS — Z0289 Encounter for other administrative examinations: Secondary | ICD-10-CM

## 2018-11-23 NOTE — Patient Outreach (Signed)
[facility]) Care Management  11/23/2018  Joanne Klein Sep 12, 1946 [id number]   Medication Adherence call to Joanne Klein left a message for patient to call back patient is due on Atorvastatin 20 mg. Joanne Klein is showing past due under [place].   [place] Management Direct Dial [phone]  Fax [phone] [email or web address]

## 2019-01-04 DIAGNOSIS — M47812 Spondylosis without myelopathy or radiculopathy, cervical region: Secondary | ICD-10-CM | POA: Diagnosis not present

## 2019-01-04 DIAGNOSIS — M797 Fibromyalgia: Secondary | ICD-10-CM | POA: Diagnosis not present

## 2019-01-04 DIAGNOSIS — Z79891 Long term (current) use of opiate analgesic: Secondary | ICD-10-CM | POA: Diagnosis not present

## 2019-01-04 DIAGNOSIS — G894 Chronic pain syndrome: Secondary | ICD-10-CM | POA: Diagnosis not present

## 2019-02-06 ENCOUNTER — Other Ambulatory Visit: Payer: Self-pay | Admitting: Internal Medicine

## 2019-02-10 ENCOUNTER — Encounter: Payer: Medicare Other | Admitting: Internal Medicine

## 2019-02-14 ENCOUNTER — Other Ambulatory Visit: Payer: Self-pay | Admitting: Internal Medicine

## 2019-02-17 ENCOUNTER — Other Ambulatory Visit (INDEPENDENT_AMBULATORY_CARE_PROVIDER_SITE_OTHER): Payer: Medicare Other

## 2019-02-17 ENCOUNTER — Encounter: Payer: Self-pay | Admitting: Internal Medicine

## 2019-02-17 ENCOUNTER — Ambulatory Visit (INDEPENDENT_AMBULATORY_CARE_PROVIDER_SITE_OTHER): Payer: Medicare Other | Admitting: Internal Medicine

## 2019-02-17 VITALS — BP 118/76 | HR 102 | Temp 97.6°F | Ht 66.0 in | Wt 228.0 lb

## 2019-02-17 DIAGNOSIS — E538 Deficiency of other specified B group vitamins: Secondary | ICD-10-CM

## 2019-02-17 DIAGNOSIS — E785 Hyperlipidemia, unspecified: Secondary | ICD-10-CM

## 2019-02-17 DIAGNOSIS — Z0000 Encounter for general adult medical examination without abnormal findings: Secondary | ICD-10-CM

## 2019-02-17 DIAGNOSIS — F3341 Major depressive disorder, recurrent, in partial remission: Secondary | ICD-10-CM

## 2019-02-17 DIAGNOSIS — K219 Gastro-esophageal reflux disease without esophagitis: Secondary | ICD-10-CM

## 2019-02-17 DIAGNOSIS — E039 Hypothyroidism, unspecified: Secondary | ICD-10-CM

## 2019-02-17 LAB — COMPREHENSIVE METABOLIC PANEL
ALT: 13 U/L (ref 0–35)
AST: 16 U/L (ref 0–37)
Albumin: 4.4 g/dL (ref 3.5–5.2)
Alkaline Phosphatase: 79 U/L (ref 39–117)
BUN: 29 mg/dL — ABNORMAL HIGH (ref 6–23)
CO2: 28 mEq/L (ref 19–32)
Calcium: 9.6 mg/dL (ref 8.4–10.5)
Chloride: 105 mEq/L (ref 96–112)
Creatinine, Ser: 1.21 mg/dL — ABNORMAL HIGH (ref 0.40–1.20)
GFR: 43.88 mL/min — ABNORMAL LOW (ref >60.00)
Glucose, Bld: 95 mg/dL (ref 70–99)
Potassium: 4.8 mEq/L (ref 3.5–5.1)
Sodium: 141 mEq/L (ref 135–145)
Total Bilirubin: 0.9 mg/dL (ref 0.2–1.2)
Total Protein: 7.5 g/dL (ref 6.0–8.3)

## 2019-02-17 LAB — VITAMIN D 25 HYDROXY (VIT D DEFICIENCY, FRACTURES): VITD: 40.08 ng/mL (ref 30.00–100.00)

## 2019-02-17 LAB — LIPID PANEL
Cholesterol: 197 mg/dL (ref 0–200)
HDL: 50.1 mg/dL (ref >39.00)
LDL Cholesterol: 119 mg/dL — ABNORMAL HIGH (ref 0–99)
NonHDL: 146.79
Total CHOL/HDL Ratio: 4
Triglycerides: 138 mg/dL (ref 0.0–149.0)
VLDL: 27.6 mg/dL (ref 0.0–40.0)

## 2019-02-17 LAB — CBC
HCT: 46 % (ref 36.0–46.0)
Hemoglobin: 16.3 g/dL — ABNORMAL HIGH (ref 12.0–15.0)
MCHC: 35.3 g/dL (ref 30.0–36.0)
MCV: 89.7 fl (ref 78.0–100.0)
Platelets: 196 10*3/uL (ref 150.0–400.0)
RBC: 5.13 Mil/uL — ABNORMAL HIGH (ref 3.87–5.11)
RDW: 13 % (ref 11.5–15.5)
WBC: 7.5 10*3/uL (ref 4.0–10.5)

## 2019-02-17 LAB — TSH: TSH: 2.69 u[IU]/mL (ref 0.35–4.50)

## 2019-02-17 LAB — T4, FREE: Free T4: 0.9 ng/dL (ref 0.60–1.60)

## 2019-02-17 LAB — FERRITIN: Ferritin: 62.7 ng/mL (ref 10.0–291.0)

## 2019-02-17 LAB — VITAMIN B12: Vitamin B-12: 297 pg/mL (ref 211–911)

## 2019-02-17 LAB — HEMOGLOBIN A1C: Hgb A1c MFr Bld: 5.2 % (ref 4.6–6.5)

## 2019-02-17 MED ORDER — LEVOTHYROXINE SODIUM 75 MCG PO TABS: 75.0000 ug | ORAL_TABLET | Freq: Every day | ORAL | 1 refills | Status: DC

## 2019-02-17 MED ORDER — SERTRALINE HCL 100 MG PO TABS: 200.0000 mg | ORAL_TABLET | Freq: Every day | ORAL | 3 refills | Status: DC

## 2019-02-17 MED ORDER — ATORVASTATIN CALCIUM 20 MG PO TABS: 20.0000 mg | ORAL_TABLET | ORAL | 3 refills | Status: DC

## 2019-02-17 NOTE — Patient Instructions (Signed)
Prevagen over the counter is available to memory and has some evidence that it helps some.   Health Maintenance, Female Adopting a healthy lifestyle and getting preventive care can go a long way to promote health and wellness. Talk with your health care provider about what schedule of regular examinations is right for you. This is a good chance for you to check in with your provider about disease prevention and staying healthy. In between checkups, there are plenty of things you can do on your own. Experts have done a lot of research about which lifestyle changes and preventive measures are most likely to keep you healthy. Ask your health care provider for more information. Weight and diet Eat a healthy diet  Be sure to include plenty of vegetables, fruits, low-fat dairy products, and lean protein.  Do not eat a lot of foods high in solid fats, added sugars, or salt.  Get regular exercise. This is one of the most important things you can do for your health. ? Most adults should exercise for at least 150 minutes each week. The exercise should increase your heart rate and make you sweat (moderate-intensity exercise). ? Most adults should also do strengthening exercises at least twice a week. This is in addition to the moderate-intensity exercise. Maintain a healthy weight  Body mass index (BMI) is a measurement that can be used to identify possible weight problems. It estimates body fat based on height and weight. Your health care provider can help determine your BMI and help you achieve or maintain a healthy weight.  For females 18 years of age and older: ? A BMI below 18.5 is considered underweight. ? A BMI of 18.5 to 24.9 is normal. ? A BMI of 25 to 29.9 is considered overweight. ? A BMI of 30 and above is considered obese. Watch levels of cholesterol and blood lipids  You should start having your blood tested for lipids and cholesterol at 71 years of age, then have this test every 5  years.  You may need to have your cholesterol levels checked more often if: ? Your lipid or cholesterol levels are high. ? You are older than 71 years of age. ? You are at high risk for heart disease. Cancer screening Lung Cancer  Lung cancer screening is recommended for adults 85-70 years old who are at high risk for lung cancer because of a history of smoking.  A yearly low-dose CT scan of the lungs is recommended for people who: ? Currently smoke. ? Have quit within the past 15 years. ? Have at least a 30-pack-year history of smoking. A pack year is smoking an average of one pack of cigarettes a day for 1 year.  Yearly screening should continue until it has been 15 years since you quit.  Yearly screening should stop if you develop a health problem that would prevent you from having lung cancer treatment. Breast Cancer  Practice breast self-awareness. This means understanding how your breasts normally appear and feel.  It also means doing regular breast self-exams. Let your health care provider know about any changes, no matter how small.  If you are in your 20s or 30s, you should have a clinical breast exam (CBE) by a health care provider every 1-3 years as part of a regular health exam.  If you are 57 or older, have a CBE every year. Also consider having a breast X-ray (mammogram) every year.  If you have a family history of breast cancer, talk to  your health care provider about genetic screening.  If you are at high risk for breast cancer, talk to your health care provider about having an MRI and a mammogram every year.  Breast cancer gene (BRCA) assessment is recommended for women who have family members with BRCA-related cancers. BRCA-related cancers include: ? Breast. ? Ovarian. ? Tubal. ? Peritoneal cancers.  Results of the assessment will determine the need for genetic counseling and BRCA1 and BRCA2 testing. Cervical Cancer Your health care provider may recommend  that you be screened regularly for cancer of the pelvic organs (ovaries, uterus, and vagina). This screening involves a pelvic examination, including checking for microscopic changes to the surface of your cervix (Pap test). You may be encouraged to have this screening done every 3 years, beginning at age 44.  For women ages 11-65, health care providers may recommend pelvic exams and Pap testing every 3 years, or they may recommend the Pap and pelvic exam, combined with testing for human papilloma virus (HPV), every 5 years. Some types of HPV increase your risk of cervical cancer. Testing for HPV may also be done on women of any age with unclear Pap test results.  Other health care providers may not recommend any screening for nonpregnant women who are considered low risk for pelvic cancer and who do not have symptoms. Ask your health care provider if a screening pelvic exam is right for you.  If you have had past treatment for cervical cancer or a condition that could lead to cancer, you need Pap tests and screening for cancer for at least 20 years after your treatment. If Pap tests have been discontinued, your risk factors (such as having a new sexual partner) need to be reassessed to determine if screening should resume. Some women have medical problems that increase the chance of getting cervical cancer. In these cases, your health care provider may recommend more frequent screening and Pap tests. Colorectal Cancer  This type of cancer can be detected and often prevented.  Routine colorectal cancer screening usually begins at 71 years of age and continues through 71 years of age.  Your health care provider may recommend screening at an earlier age if you have risk factors for colon cancer.  Your health care provider may also recommend using home test kits to check for hidden blood in the stool.  A small camera at the end of a tube can be used to examine your colon directly (sigmoidoscopy or  colonoscopy). This is done to check for the earliest forms of colorectal cancer.  Routine screening usually begins at age 60.  Direct examination of the colon should be repeated every 5-10 years through 71 years of age. However, you may need to be screened more often if early forms of precancerous polyps or small growths are found. Skin Cancer  Check your skin from head to toe regularly.  Tell your health care provider about any new moles or changes in moles, especially if there is a change in a mole's shape or color.  Also tell your health care provider if you have a mole that is larger than the size of a pencil eraser.  Always use sunscreen. Apply sunscreen liberally and repeatedly throughout the day.  Protect yourself by wearing long sleeves, pants, a wide-brimmed hat, and sunglasses whenever you are outside. Heart disease, diabetes, and high blood pressure  High blood pressure causes heart disease and increases the risk of stroke. High blood pressure is more likely to develop in: ?  People who have blood pressure in the high end of the normal range (130-139/85-89 mm Hg). ? People who are overweight or obese. ? People who are African American.  If you are 60-77 years of age, have your blood pressure checked every 3-5 years. If you are 21 years of age or older, have your blood pressure checked every year. You should have your blood pressure measured twice-once when you are at a hospital or clinic, and once when you are not at a hospital or clinic. Record the average of the two measurements. To check your blood pressure when you are not at a hospital or clinic, you can use: ? An automated blood pressure machine at a pharmacy. ? A home blood pressure monitor.  If you are between 43 years and 84 years old, ask your health care provider if you should take aspirin to prevent strokes.  Have regular diabetes screenings. This involves taking a blood sample to check your fasting blood sugar  level. ? If you are at a normal weight and have a low risk for diabetes, have this test once every three years after 71 years of age. ? If you are overweight and have a high risk for diabetes, consider being tested at a younger age or more often. Preventing infection Hepatitis B  If you have a higher risk for hepatitis B, you should be screened for this virus. You are considered at high risk for hepatitis B if: ? You were born in a country where hepatitis B is common. Ask your health care provider which countries are considered high risk. ? Your parents were born in a high-risk country, and you have not been immunized against hepatitis B (hepatitis B vaccine). ? You have HIV or AIDS. ? You use needles to inject street drugs. ? You live with someone who has hepatitis B. ? You have had sex with someone who has hepatitis B. ? You get hemodialysis treatment. ? You take certain medicines for conditions, including cancer, organ transplantation, and autoimmune conditions. Hepatitis C  Blood testing is recommended for: ? Everyone born from [birth year] through 1963. ? Anyone with known risk factors for hepatitis C. Sexually transmitted infections (STIs)  You should be screened for sexually transmitted infections (STIs) including gonorrhea and chlamydia if: ? You are sexually active and are younger than 71 years of age. ? You are older than 71 years of age and your health care provider tells you that you are at risk for this type of infection. ? Your sexual activity has changed since you were last screened and you are at an increased risk for chlamydia or gonorrhea. Ask your health care provider if you are at risk.  If you do not have HIV, but are at risk, it may be recommended that you take a prescription medicine daily to prevent HIV infection. This is called pre-exposure prophylaxis (PrEP). You are considered at risk if: ? You are sexually active and do not regularly use condoms or know the HIV status  of your partner(s). ? You take drugs by injection. ? You are sexually active with a partner who has HIV. Talk with your health care provider about whether you are at high risk of being infected with HIV. If you choose to begin PrEP, you should first be tested for HIV. You should then be tested every 3 months for as long as you are taking PrEP. Pregnancy  If you are premenopausal and you may become pregnant, ask your health care provider about  preconception counseling.  If you may become pregnant, take 400 to 800 micrograms (mcg) of folic acid every day.  If you want to prevent pregnancy, talk to your health care provider about birth control (contraception). Osteoporosis and menopause  Osteoporosis is a disease in which the bones lose minerals and strength with aging. This can result in serious bone fractures. Your risk for osteoporosis can be identified using a bone density scan.  If you are 45 years of age or older, or if you are at risk for osteoporosis and fractures, ask your health care provider if you should be screened.  Ask your health care provider whether you should take a calcium or vitamin D supplement to lower your risk for osteoporosis.  Menopause may have certain physical symptoms and risks.  Hormone replacement therapy may reduce some of these symptoms and risks. Talk to your health care provider about whether hormone replacement therapy is right for you. Follow these instructions at home:  Schedule regular health, dental, and eye exams.  Stay current with your immunizations.  Do not use any tobacco products including cigarettes, chewing tobacco, or electronic cigarettes.  If you are pregnant, do not drink alcohol.  If you are breastfeeding, limit how much and how often you drink alcohol.  Limit alcohol intake to no more than 1 drink per day for nonpregnant women. One drink equals 12 ounces of beer, 5 ounces of wine, or 1 ounces of hard liquor.  Do not use street  drugs.  Do not share needles.  Ask your health care provider for help if you need support or information about quitting drugs.  Tell your health care provider if you often feel depressed.  Tell your health care provider if you have ever been abused or do not feel safe at home. This information is not intended to replace advice given to you by your health care provider. Make sure you discuss any questions you have with your health care provider. Document Released: 06/23/2011 Document Revised: 05/15/2016 Document Reviewed: 09/11/2015 Elsevier Interactive Patient Education  2019 [employer].

## 2019-02-17 NOTE — Progress Notes (Signed)
   Subjective:   Patient ID: Joanne Klein, female    DOB: 24-May-1946, 73 y.o.   MRN: [id number]  HPI Here for medicare wellness and physical, no new complaints. Please see A/P for status and treatment of chronic medical problems.   Diet: heart healthy  Physical activity: sedentary Depression/mood screen: positive, declines change today Hearing: intact to whispered voice Visual acuity: grossly normal, performs annual eye exam  ADLs: capable Fall risk: none Home safety: good Cognitive evaluation: intact to orientation, naming, recall and repetition EOL planning: adv directives discussed    Office Visit from 02/17/2019 in [place]  PHQ-2 Total Score  6        Office Visit from 02/17/2019 in [place]  PHQ-9 Total Score  12      I have personally reviewed and have noted 1. The patient's medical and social history - reviewed today no changes 2. Their use of alcohol, tobacco or illicit drugs 3. Their current medications and supplements 4. The patient's functional ability including ADL's, fall risks, home safety risks and hearing or visual impairment. 5. Diet and physical activities 6. Evidence for depression or mood disorders 7. Care team reviewed and updated (available in snapshot)  Review of Systems  Constitutional: Positive for activity change and appetite change.  HENT: Negative.   Eyes: Negative.   Respiratory: Negative for cough, chest tightness and shortness of breath.   Cardiovascular: Negative for chest pain, palpitations and leg swelling.  Gastrointestinal: Negative for abdominal distention, abdominal pain, constipation, diarrhea, nausea and vomiting.  Musculoskeletal: Positive for arthralgias and back pain.  Skin: Negative.   Neurological: Negative.   Psychiatric/Behavioral: Positive for decreased concentration and dysphoric mood.       Memory change    Objective:  Physical Exam Constitutional:    Appearance: She is well-developed. She is obese.  HENT:     Head: Normocephalic and atraumatic.  Neck:     Musculoskeletal: Normal range of motion.  Cardiovascular:     Rate and Rhythm: Normal rate and regular rhythm.  Pulmonary:     Effort: Pulmonary effort is normal. No respiratory distress.     Breath sounds: Normal breath sounds. No wheezing or rales.  Abdominal:     General: Bowel sounds are normal. There is no distension.     Palpations: Abdomen is soft.     Tenderness: There is no abdominal tenderness. There is no rebound.  Musculoskeletal:        General: Tenderness present.  Skin:    General: Skin is warm and dry.  Neurological:     Mental Status: She is alert and oriented to person, place, and time.     Coordination: Coordination normal.     Vitals:   02/17/19 1608  BP: 118/76  Pulse: (!) 102  Temp: 97.6 F (36.4 C)  TempSrc: Oral  SpO2: 94%  Weight: 228 lb (103.4 kg)  Height: 5\' 6"  (1.676 m)    Assessment & Plan:

## 2019-02-18 NOTE — Assessment & Plan Note (Signed)
Flu shot up to date. Pneumonia complete. Shingrix counseled. Tetanus up to date. Colonoscopy due this year, reminded. Mammogram declines, pap smear aged out and dexa up to date. Counseled about sun safety and mole surveillance. Counseled about the dangers of distracted driving. Given 10 year screening recommendations.

## 2019-02-18 NOTE — Assessment & Plan Note (Signed)
BMI 84.0 however complicated by GERD, hyperlipidemia, back pain. She is encouraged to make an attempt to lose weight.

## 2019-02-18 NOTE — Assessment & Plan Note (Signed)
Checking lipid panel and adjust lipitor 20 mg daily as needed. 

## 2019-02-18 NOTE — Assessment & Plan Note (Signed)
Checking TSH and adjust synthroid 75 mcg daily.  

## 2019-02-18 NOTE — Assessment & Plan Note (Signed)
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 There are not psychotic features. Prior therapy tried many agents. Current therapy zoloft 200 mg daily which is not adjusted today. Return in 1 year.

## 2019-02-18 NOTE — Assessment & Plan Note (Signed)
Taking prilosec daily, refilled.

## 2019-03-09 DIAGNOSIS — M47812 Spondylosis without myelopathy or radiculopathy, cervical region: Secondary | ICD-10-CM | POA: Diagnosis not present

## 2019-03-09 DIAGNOSIS — G894 Chronic pain syndrome: Secondary | ICD-10-CM | POA: Diagnosis not present

## 2019-03-09 DIAGNOSIS — Z79891 Long term (current) use of opiate analgesic: Secondary | ICD-10-CM | POA: Diagnosis not present

## 2019-03-09 DIAGNOSIS — M797 Fibromyalgia: Secondary | ICD-10-CM | POA: Diagnosis not present

## 2019-04-17 ENCOUNTER — Other Ambulatory Visit: Payer: Self-pay | Admitting: Internal Medicine

## 2019-04-28 ENCOUNTER — Encounter: Payer: Self-pay | Admitting: Gastroenterology

## 2019-05-10 DIAGNOSIS — M797 Fibromyalgia: Secondary | ICD-10-CM | POA: Diagnosis not present

## 2019-05-10 DIAGNOSIS — G894 Chronic pain syndrome: Secondary | ICD-10-CM | POA: Diagnosis not present

## 2019-05-10 DIAGNOSIS — Z79891 Long term (current) use of opiate analgesic: Secondary | ICD-10-CM | POA: Diagnosis not present

## 2019-05-10 DIAGNOSIS — M47812 Spondylosis without myelopathy or radiculopathy, cervical region: Secondary | ICD-10-CM | POA: Diagnosis not present

## 2019-05-10 DIAGNOSIS — M546 Pain in thoracic spine: Secondary | ICD-10-CM | POA: Diagnosis not present

## 2019-07-21 DIAGNOSIS — M47812 Spondylosis without myelopathy or radiculopathy, cervical region: Secondary | ICD-10-CM | POA: Diagnosis not present

## 2019-07-21 DIAGNOSIS — M797 Fibromyalgia: Secondary | ICD-10-CM | POA: Diagnosis not present

## 2019-07-21 DIAGNOSIS — G894 Chronic pain syndrome: Secondary | ICD-10-CM | POA: Diagnosis not present

## 2019-07-21 DIAGNOSIS — Z79891 Long term (current) use of opiate analgesic: Secondary | ICD-10-CM | POA: Diagnosis not present

## 2019-08-23 ENCOUNTER — Encounter: Payer: Self-pay | Admitting: Gastroenterology

## 2019-08-23 ENCOUNTER — Ambulatory Visit (INDEPENDENT_AMBULATORY_CARE_PROVIDER_SITE_OTHER): Payer: Medicare Other | Admitting: Gastroenterology

## 2019-08-23 ENCOUNTER — Encounter

## 2019-08-23 VITALS — BP 136/82 | HR 100 | Temp 98.4°F | Ht 66.0 in | Wt 233.0 lb

## 2019-08-23 DIAGNOSIS — K625 Hemorrhage of anus and rectum: Secondary | ICD-10-CM | POA: Diagnosis not present

## 2019-08-23 DIAGNOSIS — K6289 Other specified diseases of anus and rectum: Secondary | ICD-10-CM

## 2019-08-23 DIAGNOSIS — K5909 Other constipation: Secondary | ICD-10-CM

## 2019-08-23 NOTE — Patient Instructions (Addendum)
You have been scheduled for an appointment with Rennis Chris at [facility]. Your appointment is on Sept 8th 2020 at 11:30 am. Please arrive at 11:00am for registration. Make certain to bring a list of current medications, including any over the counter medications or vitamins. Also bring your co-pay if you have one as well as your insurance cards. [facility] is located at [address], [address]. Should you need to reschedule your appointment, please contact them at [phone].   Thank you for choosing me and [facility].  Dr. Loletha Carrow

## 2019-08-23 NOTE — Progress Notes (Signed)
[facility] Consult Note:  History: Joanne Klein 08/23/2019  Referring provider: Hoyt Koch, MD  Reason for consult/chief complaint: rectal "growth" (bleeding from " 2 different places, the growth and also rectally", onset 3 months)   Subjective  HPI: Seen by Dr. Deatra Ina December 2014 for chronic constipation and rectal bleeding as well as nausea in the setting of chronic opioid use for fibromyalgia.  A prior colonoscopy had been incomplete due to severe diverticulosis and colonic spasm. Colonoscopy by Dr. Deatra Ina in March 2015 revealed severe left-sided diverticulosis with tortuosity and spasm, but scope was completed to the cecum with good quality preparation.  Internal hemorrhoids were noted, and two subcentimeter descending colon adenomas were removed.  Linzess 145 mcg daily was prescribed.  This is a very pleasant 73 year old woman self-referred for rectal pain and bleeding.  She has had intermittent painless rectal bleeding for years that she has attributed to hemorrhoids.  She also tends toward constipation because of her pain medicine.  Several months ago she began having anal pain with an increase in bleeding.  It is now uncomfortable to sit and the problem has been steadily worsening.  She has chronic abdominal bloating, intermittent nausea and stools are soft on MiraLAX.  ROS:  Review of Systems  Constitutional: Negative for appetite change and unexpected weight change.  HENT: Negative for mouth sores and voice change.   Eyes: Negative for pain and redness.  Respiratory: Positive for shortness of breath. Negative for cough.   Cardiovascular: Negative for chest pain and palpitations.  Genitourinary: Negative for dysuria and hematuria.  Musculoskeletal: Positive for arthralgias. Negative for myalgias.  Skin: Negative for pallor and rash.  Neurological: Negative for weakness and headaches.  Hematological: Negative for adenopathy.     Past  Medical History: Past Medical History:  Diagnosis Date  . Allergic rhinitis   . Arthritis   . Bursitis    Both Shoulders  . Chronic low back pain   . Degenerative disk disease   . Depression   . Diverticulitis   . Emphysema of lung ([place])   . GERD (gastroesophageal reflux disease)   . History of fibromyalgia   . Hypertension   . Hypothyroidism   . Irritable bowel syndrome   . Neuralgia, post-herpetic   . Obesity, unspecified   . Obstructive sleep apnea   . OCD (obsessive compulsive disorder)   . Pain in joint, site unspecified   . Pure hypercholesterolemia   . Scoliosis   . Situational stress   . Spastic colon   . Tubular adenoma   . Uterine cancer [facility])      Past Surgical History: Past Surgical History:  Procedure Laterality Date  . BREAST LUMPECTOMY     knot removed  . CHOLECYSTECTOMY    . HYSTEROTOMY    . TONSILLECTOMY    . tumor in left forearm       Family History: Family History  Problem Relation Age of Onset  . Stomach cancer Father   . Liver cancer Father   . Diabetes Father   . Colon cancer Neg Hx     Social History: Social History   Socioeconomic History  . Marital status: Divorced    Spouse name: Not on file  . Number of children: 1  . Years of education: Not on file  . Highest education level: Not on file  Occupational History  . Occupation: retired  Scientific laboratory technician  . Financial resource strain: Not on file  . Food insecurity  Worry: Not on file    Inability: Not on file  . Transportation needs    Medical: Not on file    Non-medical: Not on file  Tobacco Use  . Smoking status: Former Smoker    Packs/day: 2.00    Years: 34.00    Pack years: 68.00    Types: Cigarettes    Quit date: 12/15/1996    Years since quitting: 22.7  . Smokeless tobacco: Never Used  . Tobacco comment: vapes x 2 years  Substance and Sexual Activity  . Alcohol use: No    Alcohol/week: 0.0 standard drinks    Comment: Caffine Intake 2x's daily  . Drug use:  No  . Sexual activity: Not on file  Lifestyle  . Physical activity    Days per week: Not on file    Minutes per session: Not on file  . Stress: Not on file  Relationships  . Social Herbalist on phone: Not on file    Gets together: Not on file    Attends religious service: Not on file    Active member of club or organization: Not on file    Attends meetings of clubs or organizations: Not on file    Relationship status: Not on file  Other Topics Concern  . Not on file  Social History Narrative  . Not on file    Allergies: Allergies  Allergen Reactions  . Codeine   . Morphine And Related   . Prozac [Fluoxetine Hcl]   . Sulfa Antibiotics     Outpatient Meds: Current Outpatient Medications  Medication Sig Dispense Refill  . atorvastatin (LIPITOR) 20 MG tablet Take 1 tablet (20 mg total) by mouth every other day. 45 tablet 3  . Cholecalciferol (VITAMIN D-3) 1000 UNITS CAPS Take by mouth.    . levothyroxine (SYNTHROID, LEVOTHROID) 75 MCG tablet Take 1 tablet (75 mcg total) by mouth daily before breakfast. 90 tablet 1  . omeprazole (PRILOSEC) 40 MG capsule TAKE 1 CAPSULE BY MOUTH  DAILY 90 capsule 3  . oxyCODONE-acetaminophen (PERCOCET) 10-325 MG tablet Take 1 tablet by mouth every 4 (four) hours as needed for pain. Gets from another provider @@ 180 per month    . polyethylene glycol (MIRALAX / GLYCOLAX) packet Take 17 g by mouth daily.    . sertraline (ZOLOFT) 100 MG tablet Take 2 tablets (200 mg total) by mouth at bedtime. 180 tablet 3   No current facility-administered medications for this visit.       ___________________________________________________________________ Objective   Exam:  BP 136/82   Pulse 100   Temp 98.4 F (36.9 C)   Ht 5\' 6"  (1.676 m)   Wt 233 lb (105.7 kg)   BMI 37.61 kg/m  Entire exam chaperoned by Patti Martinique, CMA  General: Well-appearing, clearly uncomfortable while sitting.  Eyes: sclera anicteric, no redness  ENT: oral  mucosa moist without lesions, no cervical or supraclavicular lymphadenopathy  CV: RRR without murmur, S1/S2, no JVD, no peripheral edema  Resp: clear to auscultation bilaterally, normal RR and effort noted  GI: soft, no tenderness, with active bowel sounds. No guarding or palpable organomegaly noted, limited by body habitus  Skin; warm and dry, no rash or jaundice noted  Neuro: awake, alert and oriented x 3. Normal gross motor function and fluent speech Rectal: Firm tender anal mass anteriorly and extending left laterally DRE was performed, but limited due to discomfort.  Labs:  CBC Latest Ref Rng & Units 02/17/2019 11/20/2017 04/05/2015  WBC 4.0 - 10.5 K/uL 7.5 6.2 6.6  Hemoglobin 12.0 - 15.0 g/dL 16.3(H) 15.1(H) 14.9  Hematocrit 36.0 - 46.0 % 46.0 44.5 43.0  Platelets 150.0 - 400.0 K/uL 196.0 157.0 197.0     Assessment: Encounter Diagnoses  Name Primary?  . Mass of anus Yes  . Anal pain   . Anal bleeding   . Chronic constipation     Anal pain and bleeding from a mass that I suspect is malignant.  Plan:  Urgent colorectal surgery evaluation for probable exam under anesthesia and imaging.  Our office staff called [facility] to expedite this.  Thank you for the courtesy of this consult.  Please call me with any questions or concerns.  Nelida Meuse III  CC: Referring provider noted above

## 2019-08-28 ENCOUNTER — Other Ambulatory Visit: Payer: Self-pay | Admitting: Internal Medicine

## 2019-08-30 ENCOUNTER — Other Ambulatory Visit: Payer: Self-pay | Admitting: General Surgery

## 2019-08-30 DIAGNOSIS — C4492 Squamous cell carcinoma of skin, unspecified: Secondary | ICD-10-CM | POA: Diagnosis not present

## 2019-08-30 DIAGNOSIS — K6289 Other specified diseases of anus and rectum: Secondary | ICD-10-CM | POA: Diagnosis not present

## 2019-08-30 DIAGNOSIS — C44520 Squamous cell carcinoma of anal skin: Secondary | ICD-10-CM | POA: Diagnosis not present

## 2019-09-07 ENCOUNTER — Telehealth: Payer: Self-pay | Admitting: Oncology

## 2019-09-07 ENCOUNTER — Other Ambulatory Visit: Payer: Self-pay | Admitting: General Surgery

## 2019-09-07 DIAGNOSIS — C210 Malignant neoplasm of anus, unspecified: Secondary | ICD-10-CM

## 2019-09-07 DIAGNOSIS — K6289 Other specified diseases of anus and rectum: Secondary | ICD-10-CM

## 2019-09-07 NOTE — Telephone Encounter (Signed)
error 

## 2019-09-07 NOTE — Telephone Encounter (Signed)
Received a new patient referral from Dr. Marcello Moores for scc of anus. Ms. Searson has been cld and scheduled to see Dr. Benay Spice on 9/28 at 2pm. She's been made aware to arrive 15 minutes early.

## 2019-09-13 ENCOUNTER — Telehealth: Payer: Self-pay

## 2019-09-13 ENCOUNTER — Other Ambulatory Visit: Payer: Self-pay | Admitting: Radiation Oncology

## 2019-09-13 DIAGNOSIS — C210 Malignant neoplasm of anus, unspecified: Secondary | ICD-10-CM

## 2019-09-13 NOTE — Telephone Encounter (Signed)
Called patient to introduce role of GI navigator and provided my direct contact information. Reviewed upcoming appointments and advised that a PET scan has been ordered and she will receive a call with detailed information from [facility] in [employer]. Encouarged her to reach out with concerns or questions.

## 2019-09-14 ENCOUNTER — Other Ambulatory Visit: Payer: Self-pay

## 2019-09-14 ENCOUNTER — Ambulatory Visit
Admission: RE | Admit: 2019-09-14 | Discharge: 2019-09-14 | Disposition: A | Discharge status: Home / Self Care | Payer: Medicare Other | Source: Ambulatory Visit | Attending: General Surgery | Admitting: General Surgery

## 2019-09-14 DIAGNOSIS — C210 Malignant neoplasm of anus, unspecified: Secondary | ICD-10-CM

## 2019-09-14 DIAGNOSIS — K6289 Other specified diseases of anus and rectum: Secondary | ICD-10-CM

## 2019-09-14 DIAGNOSIS — J432 Centrilobular emphysema: Secondary | ICD-10-CM | POA: Diagnosis not present

## 2019-09-14 DIAGNOSIS — K5730 Diverticulosis of large intestine without perforation or abscess without bleeding: Secondary | ICD-10-CM | POA: Diagnosis not present

## 2019-09-14 MED ORDER — IOPAMIDOL (ISOVUE-300) INJECTION 61%
100.0000 mL | Freq: Once | INTRAVENOUS | Status: AC | PRN
  Administered 2019-09-14: 100 mL via INTRAVENOUS

## 2019-09-14 NOTE — Progress Notes (Signed)
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 Although conceivably from inflammation or an external hemorrhoid, I cannot exclude an anal mass. Correlate with visual inspection and rectal exam.  Mild but asymmetric left inguinal adenopathy. While possibly reactive, malignancy is also a possibility.  Biopsies of Perianal mass 08/30/2019   Past/Anticipated interventions by surgeon, if any:  Dr. Marcello Moores 09/05/2019 -Patient has enlarging perianal mass. -On exam, this appears to be fixed to underlying tissues and concerning for malignancy.   Past/Anticipated interventions by medical oncology, if any:  Dr. Benay Spice 09/19/2019   Weight changes, if any: Stable  Bowel/Bladder complaints, if any: No changes noted  Nausea / Vomiting, if any: No vomiting, has constant nausea not taking medication.  Pain issues, if any:  5/10 in her buttocks, takes oxycodone.  Any blood per rectum: occasional blood noted in her stool, fluctuations in the amount and color (bright red to darker red)  SAFETY ISSUES:  Prior radiation?  No  Pacemaker/ICD? No  Possible current pregnancy? Hysterectomy  Is the patient on methotrexate? No  Current Complaints/Details:

## 2019-09-15 ENCOUNTER — Encounter: Payer: Self-pay | Admitting: Radiation Oncology

## 2019-09-15 ENCOUNTER — Ambulatory Visit
Admission: RE | Admit: 2019-09-15 | Discharge: 2019-09-15 | Disposition: A | Discharge status: Home / Self Care | Payer: Medicare Other | Source: Ambulatory Visit | Attending: Radiation Oncology | Admitting: Radiation Oncology

## 2019-09-15 ENCOUNTER — Other Ambulatory Visit: Payer: Self-pay

## 2019-09-15 DIAGNOSIS — K7689 Other specified diseases of liver: Secondary | ICD-10-CM | POA: Diagnosis not present

## 2019-09-15 DIAGNOSIS — C210 Malignant neoplasm of anus, unspecified: Secondary | ICD-10-CM

## 2019-09-15 DIAGNOSIS — R918 Other nonspecific abnormal finding of lung field: Secondary | ICD-10-CM | POA: Diagnosis not present

## 2019-09-15 NOTE — Progress Notes (Addendum)
Radiation Oncology         [phone] ________________________________  Initial Outpatient Consultation - Conducted via telephone due to current COVID-19 concerns for limiting patient exposure  I spoke with the patient to conduct this consult visit via telephone to spare the patient unnecessary potential exposure in the healthcare setting during the current COVID-19 pandemic. The patient was notified in advance and was offered a [place] meeting to allow for face to face communication but unfortunately reported that they did not have the appropriate resources/technology to support such a visit and instead preferred to proceed with a telephone consult.  ________________________________  Name: HENRENE SUBER        MRN: [id number]  Date of Service: 09/15/2019 DOB: 1946/08/31  [id number], Real Cons, MD  Leighton Ruff, MD     REFERRING PHYSICIAN: Leighton Ruff, MD   DIAGNOSIS: The encounter diagnosis was Anal cancer [facility]).   HISTORY OF PRESENT ILLNESS: RENU CASTALDI is a 73 y.o. female seen at the request of Dr. Marcello Moores for a newly diagnosed squamous cell carcinoma of the anus. The patient reports about 4 months of anal pain and fullness. She was referred to Dr. Marcello Moores for evaluation and biopsy. She was noted to have a mass at the anal verge and a biopsy on 08/30/2019 revealed a squamous cell carcinoma. She underwent a CT of the CAP yesterday which revealed thickening of the anal canal and and soft tissues in the left gluteal fold, as well as left inguinal adenopathy. She had two small left lung nodules about 3 mm each, and a 1.6 x 1 cm lesion in the right hepatic lobe felt to be most consistent with hemangioma. She also had atherosclerotic disease, emphysematous changes of the lungs, diverticular disease without active diverticulitis, and mild biliary dilatation likely physiologic following cholecystectomy. She also has an infraumbilical hernia, and lumbar impingement in the left L4-L5, and L5-S1  regions. She is contacted today to discuss options of treatment for her cancer. A PET scan has been ordered but is not yet scheduled.    PREVIOUS RADIATION THERAPY: No   PAST MEDICAL HISTORY:  Past Medical History:  Diagnosis Date  . Allergic rhinitis   . Arthritis   . Bursitis    Both Shoulders  . Chronic low back pain   . Degenerative disk disease   . Depression   . Diverticulitis   . Emphysema of lung ([place])   . GERD (gastroesophageal reflux disease)   . History of fibromyalgia   . Hypertension   . Hypothyroidism   . Irritable bowel syndrome   . Neuralgia, post-herpetic   . Obesity, unspecified   . Obstructive sleep apnea   . OCD (obsessive compulsive disorder)   . Pain in joint, site unspecified   . Pure hypercholesterolemia   . Scoliosis   . Situational stress   . Spastic colon   . Tubular adenoma   . Uterine cancer ([place])        PAST SURGICAL HISTORY: Past Surgical History:  Procedure Laterality Date  . BREAST LUMPECTOMY     knot removed  . CHOLECYSTECTOMY    . HYSTEROTOMY    . TONSILLECTOMY    . tumor in left forearm       FAMILY HISTORY:  Family History  Problem Relation Age of Onset  . Stomach cancer Father   . Liver cancer Father   . Diabetes Father   . Colon cancer Neg Hx      SOCIAL HISTORY:  reports that she  quit smoking about 22 years ago. Her smoking use included cigarettes. She has a 68.00 pack-year smoking history. She has never used smokeless tobacco. She reports that she does not drink alcohol or use drugs. The patient is divorced and lives in [place]. She is good friends with one of our patients as well.   ALLERGIES: Codeine, Morphine and related, Prozac [fluoxetine hcl], and Sulfa antibiotics   MEDICATIONS:  Current Outpatient Medications  Medication Sig Dispense Refill  . atorvastatin (LIPITOR) 20 MG tablet Take 1 tablet (20 mg total) by mouth every other day. 45 tablet 3  . Cholecalciferol (VITAMIN D-3) 1000 UNITS CAPS Take  by mouth.    . levothyroxine (SYNTHROID) 75 MCG tablet TAKE 1 TABLET BY MOUTH  DAILY BEFORE BREAKFAST 90 tablet 1  . omeprazole (PRILOSEC) 40 MG capsule TAKE 1 CAPSULE BY MOUTH  DAILY 90 capsule 3  . oxyCODONE-acetaminophen (PERCOCET) 10-325 MG tablet Take 1 tablet by mouth every 4 (four) hours as needed for pain. Gets from another provider @@ 180 per month    . polyethylene glycol (MIRALAX / GLYCOLAX) packet Take 17 g by mouth daily.    . sertraline (ZOLOFT) 100 MG tablet Take 2 tablets (200 mg total) by mouth at bedtime. 180 tablet 3   No current facility-administered medications for this encounter.      REVIEW OF SYSTEMS: On review of systems, the patient reports that she is doing well overall. She does have some low back pain as well as anal pain and intermittent bleeding per anal region. She reports multiple bowel movements per day and has chronic diarrhea. She wipes up to 30 times following bowel movements. She denies any chest pain, shortness of breath, cough, fevers, chills, night sweats, unintended weight changes. She denies any  bladder disturbances, and denies abdominal pain, nausea or vomiting. She denies any new musculoskeletal or joint aches or pains. A complete review of systems is obtained and is otherwise negative.     PHYSICAL EXAM:  Unable to assess due to encounter type.  ECOG = 0  0 - Asymptomatic (Fully active, able to carry on all predisease activities without restriction)  1 - Symptomatic but completely ambulatory (Restricted in physically strenuous activity but ambulatory and able to carry out work of a light or sedentary nature. For example, light housework, office work)  2 - Symptomatic, <50% in bed during the day (Ambulatory and capable of all self care but unable to carry out any work activities. Up and about more than 50% of waking hours)  3 - Symptomatic, >50% in bed, but not bedbound (Capable of only limited self-care, confined to bed or chair 50% or more of  waking hours)  4 - Bedbound (Completely disabled. Cannot carry on any self-care. Totally confined to bed or chair)  5 - Death   Eustace Pen MM, Creech RH, Tormey DC, et al. [phone]). "Toxicity and response criteria of the [facility]". [place] Oncol. 5 (6): 649-55    LABORATORY DATA:  Lab Results  Component Value Date   WBC 7.5 02/17/2019   HGB 16.3 (H) 02/17/2019   HCT 46.0 02/17/2019   MCV 89.7 02/17/2019   PLT 196.0 02/17/2019   Lab Results  Component Value Date   NA 141 02/17/2019   K 4.8 02/17/2019   CL 105 02/17/2019   CO2 28 02/17/2019   Lab Results  Component Value Date   ALT 13 02/17/2019   AST 16 02/17/2019   ALKPHOS 79 02/17/2019  BILITOT 0.9 02/17/2019      RADIOGRAPHY: Ct Chest W Contrast  Result Date: 09/14/2019 CLINICAL DATA:  History of uterine cancer, swelling along the left buttock for 4 months. EXAM: CT CHEST, ABDOMEN, AND PELVIS WITH CONTRAST TECHNIQUE: Multidetector CT imaging of the chest, abdomen and pelvis was performed following the standard protocol during bolus administration of intravenous contrast. CONTRAST:  134mL ISOVUE-300 IOPAMIDOL (ISOVUE-300) INJECTION 61% COMPARISON:  Abdominal ultrasound of 04/25/2008 FINDINGS: CT CHEST FINDINGS Cardiovascular: Ascending thoracic aortic ectasia at 3.7 cm diameter. Coronary, aortic arch, and branch vessel atherosclerotic vascular disease. Mediastinum/Nodes: Unremarkable Lungs/Pleura: Striking centrilobular emphysema. Calcified 4 mm right upper lobe nodule on image 30/8 compatible with granuloma. 3 mm left upper lobe nodule, image 30/8. 3 mm left lower lobe nodule, image 98/8. Musculoskeletal: Multilevel lower thoracic spondylosis. CT ABDOMEN PELVIS FINDINGS Hepatobiliary: Peripheral hypodense 1.6 by 1.0 cm lesion in the right hepatic lobe on image 49/2 is isoenhancing to the rest of the liver on delayed images. Cholecystectomy noted. Mild intrahepatic biliary dilatation with the common bile  duct measuring 1.0 cm in diameter on image 59/2. Pancreas: Unremarkable Spleen: Upper normal splenic size. Punctate calcification compatible with old granulomatous disease. Adrenals/Urinary Tract: Unremarkable Stomach/Bowel: Fatty ileocecal valve. Descending and sigmoid colon diverticulosis without active diverticulitis. Asymmetric enhancement along the distal margin of the left anus measuring about 2.3 cm in diameter on image 64/5, the possibility of an inguinal or perianal mass is raised. Vascular/Lymphatic: Aortoiliac atherosclerotic vascular disease. Asymmetric left inguinal lymph nodes are present including a 1.5 cm in short axis left inguinal lymph node on image 111/2, an adjacent 1.5 cm left inguinal lymph node on image 109/2, and a 1.2 cm left inguinal lymph node on image 115/2. There is also a borderline enlarged 1.0 cm left external iliac lymph node on image 105/2. Reproductive: Uterus absent.  Adnexa unremarkable. Other: Mild stranding in the subcutaneous tissues extending from the left ischial tuberosity to the left gluteal fold. There is potentially some mild associated skin thickening along the left gluteal fold. Musculoskeletal: Lower lumbar spondylosis and degenerative disc disease with left foraminal impingement at L4-5 and L5-S1. Infraumbilical hernia contains adipose tissue. IMPRESSION: 1. Abnormal soft tissue fullness along the left lower anal region/perianal soft tissues. Although conceivably from inflammation or an external hemorrhoid, I cannot exclude an anal mass. Correlate with visual inspection and rectal exam. 2. Mild but asymmetric left inguinal adenopathy. While possibly reactive, malignancy is also a possibility. 3. Mild stranding in the subcutaneous tissues extending between the left ischial tuberosity the left gluteal fold, potentially with mild cutaneous thickening in the left gluteal fold. Likely inflammatory. 4. There are two small pulmonary nodules in the left lung, each 3 mm in  diameter. No follow-up needed if patient is low-risk. Non-contrast chest CT can be considered in 12 months if patient is high-risk. This recommendation follows the consensus statement: Guidelines for Management of Incidental Pulmonary Nodules Detected on CT Images: From the Fleischner Society 2017; Radiology 2017; 284:228-243. 5. 1.6 by 1.0 cm lesion in the right hepatic lobe is mildly hypoenhancing on portal venous phase images and isoenhancing on delayed phase images. Although quite likely a hemangioma or similar benign lesion, this lesion is technically nonspecific. Consider hepatic protocol MRI with and without contrast for definitive characterization. 6. Other imaging findings of potential clinical significance: Aortic Atherosclerosis (ICD10-I70.0). Coronary atherosclerosis. Emphysema (ICD10-J43.9). Descending and sigmoid colon diverticulosis without active diverticulitis. Mild biliary dilatation likely a physiologic response to cholecystectomy. Lumbar impingement on the left at L4-5 and  L5-S1. Infraumbilical hernia contains adipose tissue. Electronically Signed   By: Van Clines M.D.   On: 09/14/2019 15:37   Ct Abdomen Pelvis W Contrast  Result Date: 09/14/2019 CLINICAL DATA:  History of uterine cancer, swelling along the left buttock for 4 months. EXAM: CT CHEST, ABDOMEN, AND PELVIS WITH CONTRAST TECHNIQUE: Multidetector CT imaging of the chest, abdomen and pelvis was performed following the standard protocol during bolus administration of intravenous contrast. CONTRAST:  127mL ISOVUE-300 IOPAMIDOL (ISOVUE-300) INJECTION 61% COMPARISON:  Abdominal ultrasound of 04/25/2008 FINDINGS: CT CHEST FINDINGS Cardiovascular: Ascending thoracic aortic ectasia at 3.7 cm diameter. Coronary, aortic arch, and branch vessel atherosclerotic vascular disease. Mediastinum/Nodes: Unremarkable Lungs/Pleura: Striking centrilobular emphysema. Calcified 4 mm right upper lobe nodule on image 30/8 compatible with granuloma.  3 mm left upper lobe nodule, image 30/8. 3 mm left lower lobe nodule, image 98/8. Musculoskeletal: Multilevel lower thoracic spondylosis. CT ABDOMEN PELVIS FINDINGS Hepatobiliary: Peripheral hypodense 1.6 by 1.0 cm lesion in the right hepatic lobe on image 49/2 is isoenhancing to the rest of the liver on delayed images. Cholecystectomy noted. Mild intrahepatic biliary dilatation with the common bile duct measuring 1.0 cm in diameter on image 59/2. Pancreas: Unremarkable Spleen: Upper normal splenic size. Punctate calcification compatible with old granulomatous disease. Adrenals/Urinary Tract: Unremarkable Stomach/Bowel: Fatty ileocecal valve. Descending and sigmoid colon diverticulosis without active diverticulitis. Asymmetric enhancement along the distal margin of the left anus measuring about 2.3 cm in diameter on image 64/5, the possibility of an inguinal or perianal mass is raised. Vascular/Lymphatic: Aortoiliac atherosclerotic vascular disease. Asymmetric left inguinal lymph nodes are present including a 1.5 cm in short axis left inguinal lymph node on image 111/2, an adjacent 1.5 cm left inguinal lymph node on image 109/2, and a 1.2 cm left inguinal lymph node on image 115/2. There is also a borderline enlarged 1.0 cm left external iliac lymph node on image 105/2. Reproductive: Uterus absent.  Adnexa unremarkable. Other: Mild stranding in the subcutaneous tissues extending from the left ischial tuberosity to the left gluteal fold. There is potentially some mild associated skin thickening along the left gluteal fold. Musculoskeletal: Lower lumbar spondylosis and degenerative disc disease with left foraminal impingement at L4-5 and L5-S1. Infraumbilical hernia contains adipose tissue. IMPRESSION: 1. Abnormal soft tissue fullness along the left lower anal region/perianal soft tissues. Although conceivably from inflammation or an external hemorrhoid, I cannot exclude an anal mass. Correlate with visual inspection  and rectal exam. 2. Mild but asymmetric left inguinal adenopathy. While possibly reactive, malignancy is also a possibility. 3. Mild stranding in the subcutaneous tissues extending between the left ischial tuberosity the left gluteal fold, potentially with mild cutaneous thickening in the left gluteal fold. Likely inflammatory. 4. There are two small pulmonary nodules in the left lung, each 3 mm in diameter. No follow-up needed if patient is low-risk. Non-contrast chest CT can be considered in 12 months if patient is high-risk. This recommendation follows the consensus statement: Guidelines for Management of Incidental Pulmonary Nodules Detected on CT Images: From the Fleischner Society 2017; Radiology 2017; 284:228-243. 5. 1.6 by 1.0 cm lesion in the right hepatic lobe is mildly hypoenhancing on portal venous phase images and isoenhancing on delayed phase images. Although quite likely a hemangioma or similar benign lesion, this lesion is technically nonspecific. Consider hepatic protocol MRI with and without contrast for definitive characterization. 6. Other imaging findings of potential clinical significance: Aortic Atherosclerosis (ICD10-I70.0). Coronary atherosclerosis. Emphysema (ICD10-J43.9). Descending and sigmoid colon diverticulosis without active diverticulitis. Mild biliary dilatation  likely a physiologic response to cholecystectomy. Lumbar impingement on the left at L4-5 and L5-S1. Infraumbilical hernia contains adipose tissue. Electronically Signed   By: Van Clines M.D.   On: 09/14/2019 15:37       IMPRESSION/PLAN: 1. Squamous Cell Carcinoma of the Anus. Dr. Lisbeth Renshaw discusses the pathology findings and reviews the nature of anal carcinomas. Dr. Lisbeth Renshaw recommends continuing with plans to proceed with PET imaging for formal staging which would also help with her treatment planning. She is likely a candidate for chemoRT and we discussed this treatment. We discussed the risks, benefits, short, and  long term effects of radiotherapy, and the patient is interested in proceeding. Dr. Lisbeth Renshaw discusses the delivery and logistics of radiotherapy and anticipates a course of 6 weeks of radiotherapy. We will follow up with her PET imaging results, and with her visit with Dr. Benay Spice next week. We will then plan on simulation once there is clarity on her PET scan. She is in agreement with this plan.  2. GYN Care. The patient has had hysterectomy. She will need to continue with post radiotherapy GYN care with [place] annually. She is in agreement.  Given current concerns for patient exposure during the COVID-19 pandemic, this encounter was conducted via telephone.  The patient has given verbal consent for this type of encounter. The time spent during this encounter was 45 minutes and 50% of that time was spent in the coordination of her care. The attendants for this meeting include Dr. Lisbeth Renshaw, Blenda Nicely, RN,  Shona Simpson, [facility] and Vella Raring  During the encounter, Dr. Lisbeth Renshaw, Blenda Nicely, RN, and Shona Simpson [facility] were located at [facility] Radiation Oncology Department.  Selinda Orion [place]  was located at home.  The above documentation reflects my direct findings during this shared patient visit. Please see the separate note by Dr. Lisbeth Renshaw on this date for the remainder of the patient's plan of care.    Carola Rhine, PAC

## 2019-09-16 ENCOUNTER — Encounter: Payer: Self-pay | Admitting: General Practice

## 2019-09-16 NOTE — Progress Notes (Signed)
[place] Psychosocial Distress Screening Clinical Social Work  Clinical Social Work was referred by distress screening protocol.  The patient scored a 5 on the Psychosocial Distress Thermometer which indicates moderate distress. Clinical Social Worker contacted patient by phone to assess for distress and other psychosocial needs. Lives alone, has frequent contact w daughter and granddaughter, has good neighbors and friend who is currently in radiation.  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  "It is what it is...".  Matter of fact and accepting of her circumstances, has good support.  Plans to drive herself to treatments.  Only concern is level of pain she is currently experiencing and feeling that "it is growing."  Reviewed options available via [place], encouraged to reach out as needed.    ONCBCN DISTRESS SCREENING 09/15/2019  Screening Type Initial Screening  Distress experienced in past week (1-10) 5  Physical Problem type Pain      Clinical Social Worker follow up needed: No.  If yes, follow up plan:  Beverely Pace, [place], LCSW Clinical Social Worker Phone:  [phone]

## 2019-09-19 ENCOUNTER — Other Ambulatory Visit: Payer: Self-pay

## 2019-09-19 ENCOUNTER — Telehealth: Payer: Self-pay | Admitting: Oncology

## 2019-09-19 ENCOUNTER — Inpatient Hospital Stay: Payer: Medicare Other | Attending: Oncology | Admitting: Oncology

## 2019-09-19 VITALS — BP 146/87 | HR 94 | Temp 97.6°F | Resp 19 | Ht 66.0 in | Wt 236.4 lb

## 2019-09-19 DIAGNOSIS — K219 Gastro-esophageal reflux disease without esophagitis: Secondary | ICD-10-CM | POA: Insufficient documentation

## 2019-09-19 DIAGNOSIS — G4733 Obstructive sleep apnea (adult) (pediatric): Secondary | ICD-10-CM | POA: Diagnosis not present

## 2019-09-19 DIAGNOSIS — E669 Obesity, unspecified: Secondary | ICD-10-CM | POA: Insufficient documentation

## 2019-09-19 DIAGNOSIS — F429 Obsessive-compulsive disorder, unspecified: Secondary | ICD-10-CM | POA: Diagnosis not present

## 2019-09-19 DIAGNOSIS — M1990 Unspecified osteoarthritis, unspecified site: Secondary | ICD-10-CM | POA: Insufficient documentation

## 2019-09-19 DIAGNOSIS — M549 Dorsalgia, unspecified: Secondary | ICD-10-CM | POA: Diagnosis not present

## 2019-09-19 DIAGNOSIS — M797 Fibromyalgia: Secondary | ICD-10-CM | POA: Insufficient documentation

## 2019-09-19 DIAGNOSIS — E039 Hypothyroidism, unspecified: Secondary | ICD-10-CM | POA: Diagnosis not present

## 2019-09-19 DIAGNOSIS — I10 Essential (primary) hypertension: Secondary | ICD-10-CM | POA: Diagnosis not present

## 2019-09-19 DIAGNOSIS — R918 Other nonspecific abnormal finding of lung field: Secondary | ICD-10-CM | POA: Insufficient documentation

## 2019-09-19 DIAGNOSIS — G8929 Other chronic pain: Secondary | ICD-10-CM | POA: Diagnosis not present

## 2019-09-19 DIAGNOSIS — R234 Changes in skin texture: Secondary | ICD-10-CM | POA: Diagnosis not present

## 2019-09-19 DIAGNOSIS — C210 Malignant neoplasm of anus, unspecified: Secondary | ICD-10-CM | POA: Insufficient documentation

## 2019-09-19 DIAGNOSIS — J449 Chronic obstructive pulmonary disease, unspecified: Secondary | ICD-10-CM | POA: Diagnosis not present

## 2019-09-19 DIAGNOSIS — Z79899 Other long term (current) drug therapy: Secondary | ICD-10-CM | POA: Diagnosis not present

## 2019-09-19 DIAGNOSIS — E7800 Pure hypercholesterolemia, unspecified: Secondary | ICD-10-CM | POA: Insufficient documentation

## 2019-09-19 DIAGNOSIS — Z23 Encounter for immunization: Secondary | ICD-10-CM | POA: Diagnosis not present

## 2019-09-19 DIAGNOSIS — K589 Irritable bowel syndrome without diarrhea: Secondary | ICD-10-CM | POA: Diagnosis not present

## 2019-09-19 DIAGNOSIS — F329 Major depressive disorder, single episode, unspecified: Secondary | ICD-10-CM | POA: Insufficient documentation

## 2019-09-19 DIAGNOSIS — Z8542 Personal history of malignant neoplasm of other parts of uterus: Secondary | ICD-10-CM | POA: Insufficient documentation

## 2019-09-19 MED ORDER — INFLUENZA VAC A&B SA ADJ QUAD 0.5 ML IM PRSY
PREFILLED_SYRINGE | INTRAMUSCULAR | Status: AC
  Filled 2019-09-19: qty 0.5

## 2019-09-19 MED ORDER — INFLUENZA VAC A&B SA ADJ QUAD 0.5 ML IM PRSY
0.5000 mL | PREFILLED_SYRINGE | Freq: Once | INTRAMUSCULAR | Status: AC
  Administered 2019-09-19: 0.5 mL via INTRAMUSCULAR

## 2019-09-19 NOTE — Progress Notes (Signed)
Met with pt during initial med/onc consult with Dr. Benay Spice. Contact information for treatment team provided along with information for support groups. Anal cancer information from [facility] provided as well. Patient has daughter that lives in [place] for support. No barriers to treatment identified. Will follow as needed.

## 2019-09-19 NOTE — Telephone Encounter (Signed)
Scheduled per 09/28 los, patient received after visit summary and calender.

## 2019-09-19 NOTE — Progress Notes (Signed)
START ON PATHWAY REGIMEN - Anal Carcinoma     Chemotherapy concurrent with RT:     Mitomycin      Fluorouracil   **Always confirm dose/schedule in your pharmacy ordering system**  Patient Characteristics: Anal and Anal Margin Tumors, Newly Diagnosed - Locoregional Disease Not Amenable to Local Excision AJCC T Category: Staged < 8th Ed. AJCC N Category: Staged < 8th Ed. AJCC M Category: Staged < 8th Ed. AJCC 8 Stage Grouping: Staged < 8th Ed. Current Disease Status: Newly Diagnosed - Locoregional Disease Not Amendable to Local Excision Intent of Therapy: Curative Intent, Discussed with Patient 

## 2019-09-19 NOTE — Progress Notes (Signed)
[facility] New Patient Consult   Requesting MD: Joanne Koch, Md [place],  [place] [address]   Joanne Klein 73 y.o.  [birth year]    Reason for Consult: Anal cancer   HPI: [name] reports a 41-month history of noting a "knot" at the anus.  The area is painful and bleeds.  She saw Dr. Loletha Klein on 08/23/2019.  A left anal mass was noted. She was referred to Dr. Marcello Klein and underwent biopsy of a perianal mass on 08/30/2019.  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  There is strong diffuse P 16 staining and p63 is positive.  She underwent CTs of the chest, abdomen, and pelvis on 09/14/2019.  There are changes of emphysema.  3 mm left upper lobe and left lower lobe nodules were noted.  A hypodense 1.6 x 1 cm lesion in the right hepatic lobe is felt to most likely represent a hemangioma.  Diverticulosis without diverticulitis.  Asymmetric enhancement is noted At the distal margin of the left anus.  Asymmetric left inguinal lymph nodes including 1.5 x 1.2 cm nodes.  Borderline enlarged 1 cm left external iliac node.  Mild associated skin thickening and subcutaneous stranding in the left gluteal fold.  She was referred to radiation oncology and is being scheduled for a staging PET scan.  Past Medical History:  Diagnosis Date   Allergic rhinitis    Arthritis    Bursitis    Both Shoulders   Chronic low back pain    Degenerative disk disease    Depression    Diverticulitis    Emphysema of lung (HCC)    GERD (gastroesophageal reflux disease)    History of fibromyalgia    Hypertension    Hypothyroidism    Irritable bowel syndrome    Neuralgia, post-herpetic    Obesity, unspecified    Obstructive sleep apnea    OCD (obsessive compulsive disorder)    Pain in joint, site unspecified    Pure hypercholesterolemia    Scoliosis    Situational stress    Spastic colon    Tubular adenoma    Uterine  cancer ([facility])  age 45    .  G3 P1, Ab2  Past Surgical History:  Procedure Laterality Date   BREAST LUMPECTOMY     knot removed   CHOLECYSTECTOMY     HYSTEROTOMY     TONSILLECTOMY     tumor in left forearm      Medications: Reviewed  Allergies:  Allergies  Allergen Reactions   Codeine    Morphine And Related    Prozac [Fluoxetine Hcl]    Sulfa Antibiotics     Family history: Her father had "liver and stomach cancer ".  Social History:   She lives alone in [place].  She previously worked in an office occupation.  She was a heavy smoker until quitting in 1997.  She reports social alcohol use.  No transfusion history.  No risk factor for HIV or hepatitis.  ROS:   Positives include: Nausea for several months, intermittent abdominal pain, rectal pain and bleeding, Joanne Klein urgency, dyspnea  A complete ROS was otherwise negative.  Physical Exam:  There were no vitals taken for this visit.  HEENT: Neck without mass Lungs: Clear bilaterally Cardiac: Regular rate and rhythm Abdomen: No hepatosplenomegaly, no mass, nontender Rectal: Approximate 3 cm mass at the left anal margin extending to the distal aspect of the anal canal, the mass is ulcerated Vascular: No leg  edema Lymph nodes: No cervical, supraclavicular, axillary, or inguinal nodes Neurologic: Alert and oriented Skin: No rash Musculoskeletal: Tender in the low back   LAB: CBC and CMP ordered  Imaging:  CT images from 09/14/2019 reviewed with Joanne Klein   Assessment/Plan:   1. 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 COPD 3. Fibromyalgia 4. Chronic back pain 5. Hypothyroid 6. Depression 7. History of uterine cancer-age 84 8. Pain secondary to #1   Disposition:   Joanne Klein is referred for evaluation of anal cancer.  The tumor appears to be centered at the anal margin.  She has been evaluated by Dr. Marcello Klein and is not a candidate for primary surgical resection.  There may be metastatic disease involving pelvic lymph nodes.  I discussed the diagnosis and treatment options with Joanne Klein.  She has seen Dr. Lisbeth Klein and is scheduled for a staging PET scan.  The plan is to proceed with concurrent chemotherapy and radiation if the PET scan shows no evidence of distant metastatic disease.  I will present her case at the GI tumor conference.  I recommend 5-fluorouracil/mitomycin-C and concurrent radiation.  We reviewed potential toxicities associated with this chemotherapy regimen.  She understands the chance for nausea/vomiting, mucositis, diarrhea, alopecia, and hematologic toxicity.  We discussed the rash, sun sensitivity, hyperpigmentation, and hand/foot syndrome associated with 5-fluorouracil.  We reviewed the hemolytic uremic syndrome and rarely seen with mitomycin-C.  She agrees to proceed.  She will attend a chemotherapy teaching class.  A chemotherapy plan was entered today.   I anticipate beginning concurrent chemotherapy and radiation 10/03/2019.  Joanne Klein will be referred for placement of a PICC prior to beginning chemotherapy.  We will obtain baseline laboratory studies when she is here for the chemotherapy teaching class.  She received an influenza vaccine today. Betsy Coder, MD  09/19/2019, 2:08 PM

## 2019-09-20 ENCOUNTER — Telehealth: Payer: Self-pay | Admitting: *Deleted

## 2019-09-20 NOTE — Telephone Encounter (Signed)
Called patient to inform of Pet Scan for 09-27-19 - arrival time- 8:30 am @ [facility] Rad[facility], patient to have water only - 6 hrs. prior to test, spoke with patient and she is aware of this test

## 2019-09-20 NOTE — Telephone Encounter (Signed)
Called patient to inform of Pet Scan for 09-27-19 - arrival time- 8:30 am @ [facility] Radiology, patient to have water only 6 hrs. prior to test, spoke with patient and she is aware of this test

## 2019-09-22 DIAGNOSIS — G894 Chronic pain syndrome: Secondary | ICD-10-CM | POA: Diagnosis not present

## 2019-09-22 DIAGNOSIS — Z79891 Long term (current) use of opiate analgesic: Secondary | ICD-10-CM | POA: Diagnosis not present

## 2019-09-22 DIAGNOSIS — M797 Fibromyalgia: Secondary | ICD-10-CM | POA: Diagnosis not present

## 2019-09-23 ENCOUNTER — Inpatient Hospital Stay: Payer: Medicare Other | Attending: Oncology

## 2019-09-23 ENCOUNTER — Other Ambulatory Visit: Payer: Self-pay | Admitting: *Deleted

## 2019-09-23 ENCOUNTER — Inpatient Hospital Stay: Payer: Medicare Other

## 2019-09-23 ENCOUNTER — Encounter: Payer: Self-pay | Admitting: *Deleted

## 2019-09-23 DIAGNOSIS — C210 Malignant neoplasm of anus, unspecified: Secondary | ICD-10-CM | POA: Insufficient documentation

## 2019-09-23 DIAGNOSIS — D6959 Other secondary thrombocytopenia: Secondary | ICD-10-CM | POA: Insufficient documentation

## 2019-09-23 DIAGNOSIS — Z5111 Encounter for antineoplastic chemotherapy: Secondary | ICD-10-CM | POA: Insufficient documentation

## 2019-09-23 DIAGNOSIS — D701 Agranulocytosis secondary to cancer chemotherapy: Secondary | ICD-10-CM | POA: Insufficient documentation

## 2019-09-23 DIAGNOSIS — Z79899 Other long term (current) drug therapy: Secondary | ICD-10-CM | POA: Insufficient documentation

## 2019-09-23 DIAGNOSIS — Z20828 Contact with and (suspected) exposure to other viral communicable diseases: Secondary | ICD-10-CM | POA: Insufficient documentation

## 2019-09-23 DIAGNOSIS — R918 Other nonspecific abnormal finding of lung field: Secondary | ICD-10-CM | POA: Insufficient documentation

## 2019-09-23 MED ORDER — ONDANSETRON HCL 8 MG PO TABS: 8.0000 mg | ORAL_TABLET | Freq: Three times a day (TID) | ORAL | 1 refills | Status: DC | PRN

## 2019-09-23 MED ORDER — PROCHLORPERAZINE MALEATE 10 MG PO TABS: 10.0000 mg | ORAL_TABLET | Freq: Four times a day (QID) | ORAL | 1 refills | Status: DC | PRN

## 2019-09-26 ENCOUNTER — Telehealth: Payer: Self-pay | Admitting: *Deleted

## 2019-09-26 ENCOUNTER — Telehealth: Payer: Self-pay | Admitting: Hematology

## 2019-09-26 NOTE — Telephone Encounter (Signed)
Received vm call from pt stating that she missed her pt ed appt of fri & wants to r/s for this fri if possible.  Informed Dr Sherrill/Pod RN & message to scheduler to r/s & call pt.

## 2019-09-26 NOTE — Telephone Encounter (Signed)
R/s chemo edu per 10/5 sch message - pt is aware of appt date and time

## 2019-09-27 ENCOUNTER — Telehealth: Payer: Self-pay | Admitting: Radiation Oncology

## 2019-09-27 ENCOUNTER — Other Ambulatory Visit: Payer: Self-pay

## 2019-09-27 ENCOUNTER — Ambulatory Visit (HOSPITAL_COMMUNITY)
Admission: RE | Admit: 2019-09-27 | Discharge: 2019-09-27 | Disposition: A | Discharge status: Home / Self Care | Payer: Medicare Other | Source: Ambulatory Visit | Attending: Radiation Oncology | Admitting: Radiation Oncology

## 2019-09-27 DIAGNOSIS — I2510 Atherosclerotic heart disease of native coronary artery without angina pectoris: Secondary | ICD-10-CM | POA: Insufficient documentation

## 2019-09-27 DIAGNOSIS — C44520 Squamous cell carcinoma of anal skin: Secondary | ICD-10-CM | POA: Diagnosis not present

## 2019-09-27 DIAGNOSIS — K5730 Diverticulosis of large intestine without perforation or abscess without bleeding: Secondary | ICD-10-CM | POA: Insufficient documentation

## 2019-09-27 DIAGNOSIS — J439 Emphysema, unspecified: Secondary | ICD-10-CM | POA: Diagnosis not present

## 2019-09-27 DIAGNOSIS — C210 Malignant neoplasm of anus, unspecified: Secondary | ICD-10-CM | POA: Diagnosis not present

## 2019-09-27 DIAGNOSIS — E041 Nontoxic single thyroid nodule: Secondary | ICD-10-CM | POA: Diagnosis not present

## 2019-09-27 LAB — GLUCOSE, CAPILLARY: Glucose-Capillary: 100 mg/dL — ABNORMAL HIGH (ref 70–99)

## 2019-09-27 MED ORDER — FLUDEOXYGLUCOSE F - 18 (FDG) INJECTION
12.2300 | Freq: Once | INTRAVENOUS | Status: AC
  Administered 2019-09-27: 12.23 via INTRAVENOUS

## 2019-09-27 NOTE — Telephone Encounter (Signed)
The patient called with questions about her appts we discussed and will see her tomorrow.

## 2019-09-28 ENCOUNTER — Other Ambulatory Visit: Payer: Self-pay

## 2019-09-28 ENCOUNTER — Ambulatory Visit
Admission: RE | Admit: 2019-09-28 | Discharge: 2019-09-28 | Disposition: A | Discharge status: Home / Self Care | Payer: Medicare Other | Source: Ambulatory Visit | Attending: Radiation Oncology | Admitting: Radiation Oncology

## 2019-09-28 DIAGNOSIS — Z510 Encounter for antineoplastic radiation therapy: Secondary | ICD-10-CM | POA: Insufficient documentation

## 2019-09-28 DIAGNOSIS — C210 Malignant neoplasm of anus, unspecified: Secondary | ICD-10-CM | POA: Diagnosis not present

## 2019-09-30 ENCOUNTER — Inpatient Hospital Stay: Payer: Medicare Other

## 2019-09-30 ENCOUNTER — Other Ambulatory Visit: Payer: Self-pay

## 2019-09-30 DIAGNOSIS — C210 Malignant neoplasm of anus, unspecified: Secondary | ICD-10-CM | POA: Diagnosis not present

## 2019-09-30 DIAGNOSIS — Z510 Encounter for antineoplastic radiation therapy: Secondary | ICD-10-CM | POA: Diagnosis not present

## 2019-10-02 ENCOUNTER — Other Ambulatory Visit: Payer: Self-pay | Admitting: Oncology

## 2019-10-03 ENCOUNTER — Ambulatory Visit
Admission: RE | Admit: 2019-10-03 | Discharge: 2019-10-03 | Disposition: A | Discharge status: Home / Self Care | Payer: Medicare Other | Source: Ambulatory Visit | Attending: Radiation Oncology | Admitting: Radiation Oncology

## 2019-10-03 ENCOUNTER — Inpatient Hospital Stay: Payer: Medicare Other

## 2019-10-03 ENCOUNTER — Other Ambulatory Visit: Payer: Self-pay | Admitting: *Deleted

## 2019-10-03 ENCOUNTER — Other Ambulatory Visit: Payer: Self-pay

## 2019-10-03 ENCOUNTER — Ambulatory Visit (HOSPITAL_COMMUNITY)
Admission: RE | Admit: 2019-10-03 | Discharge: 2019-10-03 | Disposition: A | Discharge status: Home / Self Care | Payer: Medicare Other | Source: Ambulatory Visit | Attending: Oncology | Admitting: Oncology

## 2019-10-03 ENCOUNTER — Other Ambulatory Visit: Payer: Self-pay | Admitting: Oncology

## 2019-10-03 VITALS — BP 138/86 | HR 93 | Temp 98.3°F | Resp 18 | Ht 66.0 in | Wt 235.2 lb

## 2019-10-03 DIAGNOSIS — D701 Agranulocytosis secondary to cancer chemotherapy: Secondary | ICD-10-CM | POA: Diagnosis not present

## 2019-10-03 DIAGNOSIS — C210 Malignant neoplasm of anus, unspecified: Secondary | ICD-10-CM

## 2019-10-03 DIAGNOSIS — Z20828 Contact with and (suspected) exposure to other viral communicable diseases: Secondary | ICD-10-CM | POA: Diagnosis not present

## 2019-10-03 DIAGNOSIS — Z510 Encounter for antineoplastic radiation therapy: Secondary | ICD-10-CM | POA: Diagnosis not present

## 2019-10-03 DIAGNOSIS — R918 Other nonspecific abnormal finding of lung field: Secondary | ICD-10-CM | POA: Diagnosis not present

## 2019-10-03 DIAGNOSIS — Z79899 Other long term (current) drug therapy: Secondary | ICD-10-CM | POA: Diagnosis not present

## 2019-10-03 DIAGNOSIS — D6959 Other secondary thrombocytopenia: Secondary | ICD-10-CM | POA: Diagnosis not present

## 2019-10-03 DIAGNOSIS — Z5111 Encounter for antineoplastic chemotherapy: Secondary | ICD-10-CM | POA: Diagnosis not present

## 2019-10-03 LAB — CBC WITH DIFFERENTIAL (CANCER CENTER ONLY)
Abs Immature Granulocytes: 0.03 10*3/uL (ref 0.00–0.07)
Basophils Absolute: 0 10*3/uL (ref 0.0–0.1)
Basophils Relative: 1 %
Eosinophils Absolute: 0.2 10*3/uL (ref 0.0–0.5)
Eosinophils Relative: 2 %
HCT: 40.9 % (ref 36.0–46.0)
Hemoglobin: 14 g/dL (ref 12.0–15.0)
Immature Granulocytes: 0 %
Lymphocytes Relative: 23 %
Lymphs Abs: 1.6 10*3/uL (ref 0.7–4.0)
MCH: 30.3 pg (ref 26.0–34.0)
MCHC: 34.2 g/dL (ref 30.0–36.0)
MCV: 88.5 fL (ref 80.0–100.0)
Monocytes Absolute: 0.8 10*3/uL (ref 0.1–1.0)
Monocytes Relative: 12 %
Neutro Abs: 4.4 10*3/uL (ref 1.7–7.7)
Neutrophils Relative %: 62 %
Platelet Count: 152 10*3/uL (ref 150–400)
RBC: 4.62 MIL/uL (ref 3.87–5.11)
RDW: 12.5 % (ref 11.5–15.5)
WBC Count: 7 10*3/uL (ref 4.0–10.5)
nRBC: 0 % (ref 0.0–0.2)

## 2019-10-03 LAB — CMP (CANCER CENTER ONLY)
ALT: 11 U/L (ref 0–44)
AST: 13 U/L — ABNORMAL LOW (ref 15–41)
Albumin: 3.6 g/dL (ref 3.5–5.0)
Alkaline Phosphatase: 83 U/L (ref 38–126)
Anion gap: 9 (ref 5–15)
BUN: 28 mg/dL — ABNORMAL HIGH (ref 8–23)
CO2: 25 mmol/L (ref 22–32)
Calcium: 8.8 mg/dL — ABNORMAL LOW (ref 8.9–10.3)
Chloride: 107 mmol/L (ref 98–111)
Creatinine: 1.08 mg/dL — ABNORMAL HIGH (ref 0.44–1.00)
GFR, Est AFR Am: 60 mL/min — ABNORMAL LOW (ref >60)
GFR, Estimated: 52 mL/min — ABNORMAL LOW (ref >60)
Glucose, Bld: 132 mg/dL — ABNORMAL HIGH (ref 70–99)
Potassium: 3.8 mmol/L (ref 3.5–5.1)
Sodium: 141 mmol/L (ref 135–145)
Total Bilirubin: 0.6 mg/dL (ref 0.3–1.2)
Total Protein: 7 g/dL (ref 6.5–8.1)

## 2019-10-03 MED ORDER — LIDOCAINE HCL 1 % IJ SOLN
INTRAMUSCULAR | Status: AC
  Filled 2019-10-03: qty 20

## 2019-10-03 MED ORDER — PROCHLORPERAZINE MALEATE 10 MG PO TABS
10.0000 mg | ORAL_TABLET | Freq: Once | ORAL | Status: AC
  Administered 2019-10-03: 11:00:00 10 mg via ORAL

## 2019-10-03 MED ORDER — MITOMYCIN CHEMO IV INJECTION 20 MG
8.0000 mg/m2 | Freq: Once | INTRAVENOUS | Status: AC
  Administered 2019-10-03: 18 mg via INTRAVENOUS
  Filled 2019-10-03: qty 36

## 2019-10-03 MED ORDER — PROCHLORPERAZINE MALEATE 10 MG PO TABS
ORAL_TABLET | ORAL | Status: AC
  Filled 2019-10-03: qty 1

## 2019-10-03 MED ORDER — SODIUM CHLORIDE 0.9 % IV SOLN
Freq: Once | INTRAVENOUS | Status: AC
  Administered 2019-10-03: 11:00:00 via INTRAVENOUS
  Filled 2019-10-03: qty 250

## 2019-10-03 MED ORDER — SODIUM CHLORIDE 0.9 % IV SOLN
1000.0000 mg/m2/d | INTRAVENOUS | Status: DC
  Administered 2019-10-03: 8900 mg via INTRAVENOUS
  Filled 2019-10-03: qty 178

## 2019-10-03 MED ORDER — PROCHLORPERAZINE MALEATE 10 MG PO TABS: 10.0000 mg | ORAL_TABLET | Freq: Four times a day (QID) | ORAL | 0 refills | Status: DC | PRN

## 2019-10-03 NOTE — Procedures (Signed)
Pre procedural Diagnosis: Poor venous access Post Procedural Diagnosis: Same  Successful placement of right basilic vein approach 41 cm single lumen PICC line with tip at the superior caval-atrial junction.    EBL: None  No immediate post procedural complication.  The PICC line is ready for immediate use.  Ronny Bacon, MD Pager #: [phone]

## 2019-10-03 NOTE — Progress Notes (Signed)
Patient informed treatment nurse that she has not received her compazine from her mail order pharmacy yet. Ordered #20 tabs at her local pharmacy and suggested she f/u with [facility] for status of delivery.

## 2019-10-03 NOTE — Patient Instructions (Signed)
[facility] Discharge Instructions for Patients Receiving Chemotherapy  Today you received the following chemotherapy agents mitomycin and Fluorouracil (5FU)  To help prevent nausea and vomiting after your treatment, we encourage you to take your nausea medication as directed   If you develop nausea and vomiting that is not controlled by your nausea medication, call the clinic.   BELOW ARE SYMPTOMS THAT SHOULD BE REPORTED IMMEDIATELY:  *FEVER GREATER THAN 100.5 F  *CHILLS WITH OR WITHOUT FEVER  NAUSEA AND VOMITING THAT IS NOT CONTROLLED WITH YOUR NAUSEA MEDICATION  *UNUSUAL SHORTNESS OF BREATH  *UNUSUAL BRUISING OR BLEEDING  TENDERNESS IN MOUTH AND THROAT WITH OR WITHOUT PRESENCE OF ULCERS  *URINARY PROBLEMS  *BOWEL PROBLEMS  UNUSUAL RASH Items with * indicate a potential emergency and should be followed up as soon as possible.  Feel free to call the clinic should you have any questions or concerns. The clinic phone number is [phone].  Please show the [facility] at check-in to the Emergency Department and triage nurse.  Mitomycin injection What is this medicine? MITOMYCIN (mye toe MYE sin) is a chemotherapy drug. This medicine is used to treat cancer of the stomach and pancreas. This medicine may be used for other purposes; ask your health care provider or pharmacist if you have questions. COMMON BRAND NAME(S): Mutamycin What should I tell my health care provider before I take this medicine? They need to know if you have any of these conditions:  anemia  bleeding disorder  infection (especially a virus infection such as chickenpox, cold sores, or herpes)  kidney disease  low blood counts like low platelets, red blood cells, white blood cells  recent radiation therapy  an unusual or allergic reaction to mitomycin, other chemotherapy agents, other medicines, foods, dyes, or preservatives  pregnant or trying to get  pregnant  breast-feeding How should I use this medicine? This drug is given as an injection or infusion into a vein. It is administered in a hospital or clinic by a specially trained health care professional. Talk to your pediatrician regarding the use of this medicine in children. Special care may be needed. Overdosage: If you think you have taken too much of this medicine contact a poison control center or emergency room at once. NOTE: This medicine is only for you. Do not share this medicine with others. What if I miss a dose? It is important not to miss your dose. Call your doctor or health care professional if you are unable to keep an appointment. What may interact with this medicine?  medicines to increase blood counts like filgrastim, pegfilgrastim, sargramostim  vaccines This list may not describe all possible interactions. Give your health care provider a list of all the medicines, herbs, non-prescription drugs, or dietary supplements you use. Also tell them if you smoke, drink alcohol, or use illegal drugs. Some items may interact with your medicine. What should I watch for while using this medicine? Your condition will be monitored carefully while you are receiving this medicine. You will need important blood work done while you are taking this medicine. This drug may make you feel generally unwell. This is not uncommon, as chemotherapy can affect healthy cells as well as cancer cells. Report any side effects. Continue your course of treatment even though you feel ill unless your doctor tells you to stop. Call your doctor or health care professional for advice if you get a fever, chills or sore throat, or other symptoms of a cold  or flu. Do not treat yourself. This drug decreases your body's ability to fight infections. Try to avoid being around people who are sick. This medicine may increase your risk to bruise or bleed. Call your doctor or health care professional if you notice any  unusual bleeding. Be careful brushing and flossing your teeth or using a toothpick because you may get an infection or bleed more easily. If you have any dental work done, tell your dentist you are receiving this medicine. Avoid taking products that contain aspirin, acetaminophen, ibuprofen, naproxen, or ketoprofen unless instructed by your doctor. These medicines may hide a fever. Do not become pregnant while taking this medicine. Women should inform their doctor if they wish to become pregnant or think they might be pregnant. There is a potential for serious side effects to an unborn child. Talk to your health care professional or pharmacist for more information. Do not breast-feed an infant while taking this medicine. What side effects may I notice from receiving this medicine? Side effects that you should report to your doctor or health care professional as soon as possible:  allergic reactions like skin rash, itching or hives, swelling of the face, lips, or tongue  low blood counts - this medicine may decrease the number of white blood cells, red blood cells and platelets. You may be at increased risk for infections and bleeding.  signs of infection - fever or chills, cough, sore throat, pain or difficulty passing urine  signs of decreased platelets or bleeding - bruising, pinpoint red spots on the skin, black, tarry stools, blood in the urine  signs of decreased red blood cells - unusually weak or tired, fainting spells, lightheadedness  breathing problems  changes in vision  chest pain  confusion  dry cough  high blood pressure  mouth sores  pain, swelling, redness at site where injected  pain, tingling, numbness in the hands or feet  seizures  swelling of the ankles, feet, hands  trouble passing urine or change in the amount of urine Side effects that usually do not require medical attention (report to your doctor or health care professional if they continue or are  bothersome):  diarrhea  green to blue color of urine  hair loss  loss of appetite  nausea, vomiting This list may not describe all possible side effects. Call your doctor for medical advice about side effects. You may report side effects to FDA at 1-800-FDA-1088. Where should I keep my medicine? This drug is given in a hospital or clinic and will not be stored at home. NOTE: This sheet is a summary. It may not cover all possible information. If you have questions about this medicine, talk to your doctor, pharmacist, or health care provider.  2020 Elsevier/Gold Standard (2008-06-15 11:16:23)   Fluorouracil, 5-FU injection What is this medicine? FLUOROURACIL, 5-FU (flure oh YOOR a sil) is a chemotherapy drug. It slows the growth of cancer cells. This medicine is used to treat many types of cancer like breast cancer, colon or rectal cancer, pancreatic cancer, and stomach cancer. This medicine may be used for other purposes; ask your health care provider or pharmacist if you have questions. COMMON BRAND NAME(S): Adrucil What should I tell my health care provider before I take this medicine? They need to know if you have any of these conditions:  blood disorders  dihydropyrimidine dehydrogenase (DPD) deficiency  infection (especially a virus infection such as chickenpox, cold sores, or herpes)  kidney disease  liver disease  malnourished,  poor nutrition  recent or ongoing radiation therapy  an unusual or allergic reaction to fluorouracil, other chemotherapy, other medicines, foods, dyes, or preservatives  pregnant or trying to get pregnant  breast-feeding How should I use this medicine? This drug is given as an infusion or injection into a vein. It is administered in a hospital or clinic by a specially trained health care professional. Talk to your pediatrician regarding the use of this medicine in children. Special care may be needed. Overdosage: If you think you have  taken too much of this medicine contact a poison control center or emergency room at once. NOTE: This medicine is only for you. Do not share this medicine with others. What if I miss a dose? It is important not to miss your dose. Call your doctor or health care professional if you are unable to keep an appointment. What may interact with this medicine?  allopurinol  cimetidine  dapsone  digoxin  hydroxyurea  leucovorin  levamisole  medicines for seizures like ethotoin, fosphenytoin, phenytoin  medicines to increase blood counts like filgrastim, pegfilgrastim, sargramostim  medicines that treat or prevent blood clots like warfarin, enoxaparin, and dalteparin  methotrexate  metronidazole  pyrimethamine  some other chemotherapy drugs like busulfan, cisplatin, estramustine, vinblastine  trimethoprim  trimetrexate  vaccines Talk to your doctor or health care professional before taking any of these medicines:  acetaminophen  aspirin  ibuprofen  ketoprofen  naproxen This list may not describe all possible interactions. Give your health care provider a list of all the medicines, herbs, non-prescription drugs, or dietary supplements you use. Also tell them if you smoke, drink alcohol, or use illegal drugs. Some items may interact with your medicine. What should I watch for while using this medicine? Visit your doctor for checks on your progress. This drug may make you feel generally unwell. This is not uncommon, as chemotherapy can affect healthy cells as well as cancer cells. Report any side effects. Continue your course of treatment even though you feel ill unless your doctor tells you to stop. In some cases, you may be given additional medicines to help with side effects. Follow all directions for their use. Call your doctor or health care professional for advice if you get a fever, chills or sore throat, or other symptoms of a cold or flu. Do not treat yourself. This  drug decreases your body's ability to fight infections. Try to avoid being around people who are sick. This medicine may increase your risk to bruise or bleed. Call your doctor or health care professional if you notice any unusual bleeding. Be careful brushing and flossing your teeth or using a toothpick because you may get an infection or bleed more easily. If you have any dental work done, tell your dentist you are receiving this medicine. Avoid taking products that contain aspirin, acetaminophen, ibuprofen, naproxen, or ketoprofen unless instructed by your doctor. These medicines may hide a fever. Do not become pregnant while taking this medicine. Women should inform their doctor if they wish to become pregnant or think they might be pregnant. There is a potential for serious side effects to an unborn child. Talk to your health care professional or pharmacist for more information. Do not breast-feed an infant while taking this medicine. Men should inform their doctor if they wish to father a child. This medicine may lower sperm counts. Do not treat diarrhea with over the counter products. Contact your doctor if you have diarrhea that lasts more than 2 days  or if it is severe and watery. This medicine can make you more sensitive to the sun. Keep out of the sun. If you cannot avoid being in the sun, wear protective clothing and use sunscreen. Do not use sun lamps or tanning beds/booths. What side effects may I notice from receiving this medicine? Side effects that you should report to your doctor or health care professional as soon as possible:  allergic reactions like skin rash, itching or hives, swelling of the face, lips, or tongue  low blood counts - this medicine may decrease the number of white blood cells, red blood cells and platelets. You may be at increased risk for infections and bleeding.  signs of infection - fever or chills, cough, sore throat, pain or difficulty passing urine  signs  of decreased platelets or bleeding - bruising, pinpoint red spots on the skin, black, tarry stools, blood in the urine  signs of decreased red blood cells - unusually weak or tired, fainting spells, lightheadedness  breathing problems  changes in vision  chest pain  mouth sores  nausea and vomiting  pain, swelling, redness at site where injected  pain, tingling, numbness in the hands or feet  redness, swelling, or sores on hands or feet  stomach pain  unusual bleeding Side effects that usually do not require medical attention (report to your doctor or health care professional if they continue or are bothersome):  changes in finger or toe nails  diarrhea  dry or itchy skin  hair loss  headache  loss of appetite  sensitivity of eyes to the light  stomach upset  unusually teary eyes This list may not describe all possible side effects. Call your doctor for medical advice about side effects. You may report side effects to FDA at 1-800-FDA-1088. Where should I keep my medicine? This drug is given in a hospital or clinic and will not be stored at home. NOTE: This sheet is a summary. It may not cover all possible information. If you have questions about this medicine, talk to your doctor, pharmacist, or health care provider.  2020 Elsevier/Gold Standard (2008-04-12 13:53:16)

## 2019-10-04 ENCOUNTER — Ambulatory Visit
Admission: RE | Admit: 2019-10-04 | Discharge: 2019-10-04 | Disposition: A | Discharge status: Home / Self Care | Payer: Medicare Other | Source: Ambulatory Visit | Attending: Radiation Oncology | Admitting: Radiation Oncology

## 2019-10-04 ENCOUNTER — Telehealth: Payer: Self-pay | Admitting: *Deleted

## 2019-10-04 ENCOUNTER — Other Ambulatory Visit: Payer: Self-pay

## 2019-10-04 ENCOUNTER — Telehealth: Payer: Self-pay

## 2019-10-04 ENCOUNTER — Ambulatory Visit: Payer: Medicare Other

## 2019-10-04 DIAGNOSIS — Z510 Encounter for antineoplastic radiation therapy: Secondary | ICD-10-CM | POA: Diagnosis not present

## 2019-10-04 DIAGNOSIS — C210 Malignant neoplasm of anus, unspecified: Secondary | ICD-10-CM | POA: Diagnosis not present

## 2019-10-04 NOTE — Telephone Encounter (Signed)
Called Ms. Shelden to touch base after chemo/rad start on 10/12. No navigation needs voiced. I reminded her that she can call me with questions or support. She has my direct contact information.

## 2019-10-04 NOTE — Telephone Encounter (Signed)
-----   Message from Royston Bake, RN sent at 10/04/2019  8:55 AM EDT ----- Regarding: Dr. Benay Spice first time mitomycin and 96 hour 5FU infusion Dr. Benay Spice first time mitomycin and 96 hour 5FU infusion.  Pt has PICC line, Please make sure she was able to change her shirt.  Thanks karen

## 2019-10-04 NOTE — Telephone Encounter (Signed)
Called home # & left message for pt to return call regarding how she did with her chemotherapy treatment yest.  Mobile # does not accept messages.

## 2019-10-05 ENCOUNTER — Ambulatory Visit: Payer: Medicare Other

## 2019-10-05 ENCOUNTER — Ambulatory Visit
Admission: RE | Admit: 2019-10-05 | Discharge: 2019-10-05 | Disposition: A | Discharge status: Home / Self Care | Payer: Medicare Other | Source: Ambulatory Visit | Attending: Radiation Oncology | Admitting: Radiation Oncology

## 2019-10-05 DIAGNOSIS — Z510 Encounter for antineoplastic radiation therapy: Secondary | ICD-10-CM | POA: Diagnosis not present

## 2019-10-05 DIAGNOSIS — C210 Malignant neoplasm of anus, unspecified: Secondary | ICD-10-CM | POA: Diagnosis not present

## 2019-10-05 NOTE — Progress Notes (Signed)
Pt here for patient teaching.  Pt given Radiation and You booklet.  Reviewed areas of pertinence such as diarrhea, fatigue, hair loss, nausea and vomiting, sexual and fertility changes, skin changes and urinary and bladder changes . Pt able to give teach back of to pat skin, use unscented/gentle soap, use baby wipes, have Imodium on hand, drink plenty of water and sitz bath,avoid applying anything to skin within 4 hours of treatment. Pt verbalizes understanding of information given and will contact nursing with any questions or concerns.     Tesean Stump M. Abdullah Rizzi RN, BSN      

## 2019-10-06 ENCOUNTER — Ambulatory Visit
Admission: RE | Admit: 2019-10-06 | Discharge: 2019-10-06 | Disposition: A | Discharge status: Home / Self Care | Payer: Medicare Other | Source: Ambulatory Visit | Attending: Radiation Oncology | Admitting: Radiation Oncology

## 2019-10-06 ENCOUNTER — Telehealth: Payer: Self-pay

## 2019-10-06 ENCOUNTER — Other Ambulatory Visit: Payer: Self-pay

## 2019-10-06 DIAGNOSIS — C210 Malignant neoplasm of anus, unspecified: Secondary | ICD-10-CM | POA: Diagnosis not present

## 2019-10-06 DIAGNOSIS — Z510 Encounter for antineoplastic radiation therapy: Secondary | ICD-10-CM | POA: Diagnosis not present

## 2019-10-06 NOTE — Telephone Encounter (Signed)
Reviewed nausea medications and upcoming appointments.

## 2019-10-07 ENCOUNTER — Encounter: Payer: Self-pay | Admitting: Oncology

## 2019-10-07 ENCOUNTER — Other Ambulatory Visit: Payer: Self-pay

## 2019-10-07 ENCOUNTER — Ambulatory Visit
Admission: RE | Admit: 2019-10-07 | Discharge: 2019-10-07 | Disposition: A | Discharge status: Home / Self Care | Payer: Medicare Other | Source: Ambulatory Visit | Attending: Radiation Oncology | Admitting: Radiation Oncology

## 2019-10-07 ENCOUNTER — Inpatient Hospital Stay: Payer: Medicare Other

## 2019-10-07 ENCOUNTER — Other Ambulatory Visit: Payer: Self-pay | Admitting: *Deleted

## 2019-10-07 DIAGNOSIS — Z510 Encounter for antineoplastic radiation therapy: Secondary | ICD-10-CM | POA: Diagnosis not present

## 2019-10-07 DIAGNOSIS — C210 Malignant neoplasm of anus, unspecified: Secondary | ICD-10-CM | POA: Diagnosis not present

## 2019-10-07 NOTE — Progress Notes (Signed)
Met with patient at registration to introduce myself as Financial Resource Specialist and to offer available resources. ° °Discussed one-time $700 [facility] grant and qualifications to assist with personal expenses while going through treatment. ° °Gave her my card if interested in applying and for any additional financial questions or concerns. °

## 2019-10-07 NOTE — Patient Instructions (Signed)
PICC Removal, Adult, Care After This sheet gives you information about how to care for yourself after your procedure. Your health care provider may also give you more specific instructions. If you have problems or questions, contact your health care provider. What can I expect after the procedure? After your procedure, it is common to have:  Tenderness or soreness.  Redness, swelling, or a scab where the PICC was removed (exit site). Follow these instructions at home: For the first 24 hours after the procedure   Keep the bandage (dressing) on the exit site clean and dry. Do not remove the dressing until your health care provider tells you to do so.  Check your arm often for signs and symptoms of an infection. Check for: ? A red streak that spreads away from the dressing. ? Blood or fluid that you can see on the dressing. ? More redness or swelling.  Do not lift anything heavy or do activities that require great effort until your health care provider says it is okay. You should avoid: ? Lifting weights. ? Yard work. ? Any physical activity with repetitive arm movement.  Watch closely for any signs of an air bubble in the vein (air embolism). This is a rare but serious complication. If you have signs of air embolism, call 911 immediately and lie down on your left side to keep the air from moving into the lungs. Signs of an air embolism include: ? Difficulty breathing. ? Chest pain. ? Coughing or wheezing. ? Skin that is pale, blue, cold, or clammy. ? Rapid pulse. ? Rapid breathing. ? Fainting. After 24 Hours have passed:  Remove your dressing as told by your health care provider. Make sure you wash your hands with soap and water before and after you change the dressing. If soap and water are not available, use hand sanitizer.  Return to your normal activities as told by your health care provider.  A small scab may develop over the exit site. Do not pick at the scab.  When  bathing or showering, gently wash the exit site with soap and water. Pat it dry.  Watch for signs of infection, such as: ? Fever or chills. ? Swollen glands under the arm. ? More redness, swelling, or soreness in the arm. ? Blood, fluid, or pus coming from the exit site. ? Warmth or a bad smell at the exit site. ? A red streak spreading away from the exit site. General instructions  Take over-the-counter and prescription medicines only as told by your health care provider. Do not take any new medicines without checking with your health care provider first.  If you were prescribed an antibiotic medicine, apply or take it as told by your health care provider. Do not stop using the antibiotic even if your condition improves.  Keep all follow-up visits as told by your health care provider. This is important. Contact a health care provider if:  You have a fever or chills.  You have soreness, redness, or swelling on your exit site, and it gets worse.  You have swollen glands under your arm.  You have any of the following symptoms at your exit site: ? Blood, fluid, or pus. ? Unusual warmth. ? A bad smell. ? A red streak spreading away from the exit site. Get help right away if:  You have numbness or tingling in your fingers, hand, or arm.  Your arm looks blue and feels cold.  You have signs of an air embolism, such   as: ? Difficulty breathing. ? Chest pain. ? Coughing or wheezing. ? Skin that is pale, blue, cold, or clammy. ? Rapid pulse. ? Rapid breathing. ? Fainting. These symptoms may represent a serious problem that is an emergency. Do not wait to see if the symptoms will go away. Get medical help right away. Call your local emergency services (911 in the [place] Do not drive yourself to the hospital. Summary  After your procedure, it is common to have tenderness or soreness, redness, swelling, or a scab at the exit site.  Keep the dressing over the exit site clean and dry.  Do not remove the dressing until your health care provider tells you to do so.  Do not lift anything heavy or do activities that require great effort until your health care provider says it is okay.  Watch closely for any signs of an air embolism. If you have signs of air embolism, call 911 immediately and lie down on your left side. This information is not intended to replace advice given to you by your health care provider. Make sure you discuss any questions you have with your health care provider. Document Released: 12/13/2013 Document Revised: 11/20/2017 Document Reviewed: 02/03/2017 Elsevier Patient Education  2020 Elsevier Inc.  

## 2019-10-10 ENCOUNTER — Ambulatory Visit: Payer: Medicare Other | Admitting: Oncology

## 2019-10-10 ENCOUNTER — Telehealth: Payer: Self-pay

## 2019-10-10 ENCOUNTER — Ambulatory Visit: Payer: Medicare Other

## 2019-10-10 ENCOUNTER — Telehealth: Payer: Self-pay | Admitting: *Deleted

## 2019-10-10 ENCOUNTER — Telehealth: Payer: Self-pay | Admitting: Oncology

## 2019-10-10 ENCOUNTER — Other Ambulatory Visit: Payer: Medicare Other

## 2019-10-10 DIAGNOSIS — C210 Malignant neoplasm of anus, unspecified: Secondary | ICD-10-CM

## 2019-10-10 NOTE — Telephone Encounter (Signed)
Returned patients phone call she is complaining of severe sore throat body aches does not know if she has a fever. She was advised by on call nurse to be seen for her sore throat but she decided to call here instead. Advised patient to go to the urgent care or ED to be tested for Covid advised her to not come for treatment until after she was seen. States she will call her PCP to see what they advise. Again made her aware she soul not be sick and come for treatment. Spoke with Shona Simpson after phone call.

## 2019-10-10 NOTE — Telephone Encounter (Signed)
Returned patient's phone call regarding 10/20 appointments, patient is notified of upcoming appointments.

## 2019-10-10 NOTE — Telephone Encounter (Signed)
After speaking with Shona Simpson recommended that patient call her medical oncology physician as this could be from her chemo patient states she will call Dawn the nurse navigator.

## 2019-10-10 NOTE — Telephone Encounter (Signed)
Left VM she is not able to come to office. Now having some diarrhea--has taken her 4th Imodium and it slowing up. Would like to come tomorrow instead.

## 2019-10-10 NOTE — Telephone Encounter (Addendum)
Patient called to report "terrible sore throat" and mouth hurts to eat or drink. Denies fever. Reports she has been feeling more short of breath since Saturday. This RN can hear her panting over the phone from getting up from toilet and standing at the sink. She has COPD, but admits this is much worse than usual. She is not coughing and still has her sense of taste/smell. She called RT and was told not to come for tx, because she may have COVID.  1st cycle of 5FU/Mitomycin was 10/03/19 and MD feels this is most likely due to her chemotherapy. Patient lives alone and would need to get someone to bring her in today.  Dr. Benay Spice agrees she needs to be seen here or at emergency room today. She will come in today and find a friend to bring her in.

## 2019-10-11 ENCOUNTER — Ambulatory Visit
Admission: RE | Admit: 2019-10-11 | Discharge: 2019-10-11 | Disposition: A | Discharge status: Home / Self Care | Payer: Medicare Other | Source: Ambulatory Visit | Attending: Radiation Oncology | Admitting: Radiation Oncology

## 2019-10-11 ENCOUNTER — Ambulatory Visit: Payer: Medicare Other | Admitting: Nurse Practitioner

## 2019-10-11 ENCOUNTER — Other Ambulatory Visit: Payer: Medicare Other

## 2019-10-11 ENCOUNTER — Inpatient Hospital Stay: Payer: Medicare Other

## 2019-10-11 ENCOUNTER — Other Ambulatory Visit: Payer: Self-pay

## 2019-10-11 ENCOUNTER — Encounter: Payer: Self-pay | Admitting: Nurse Practitioner

## 2019-10-11 ENCOUNTER — Ambulatory Visit (HOSPITAL_BASED_OUTPATIENT_CLINIC_OR_DEPARTMENT_OTHER): Payer: Medicare Other | Admitting: Medical

## 2019-10-11 ENCOUNTER — Inpatient Hospital Stay: Payer: Medicare Other | Admitting: Nurse Practitioner

## 2019-10-11 VITALS — BP 132/68 | HR 96 | Resp 18

## 2019-10-11 VITALS — BP 80/50 | HR 100 | Temp 98.0°F | Resp 16 | Ht 66.0 in | Wt 233.5 lb

## 2019-10-11 DIAGNOSIS — Z510 Encounter for antineoplastic radiation therapy: Secondary | ICD-10-CM | POA: Diagnosis not present

## 2019-10-11 DIAGNOSIS — I951 Orthostatic hypotension: Secondary | ICD-10-CM | POA: Diagnosis not present

## 2019-10-11 DIAGNOSIS — C210 Malignant neoplasm of anus, unspecified: Secondary | ICD-10-CM

## 2019-10-11 DIAGNOSIS — R918 Other nonspecific abnormal finding of lung field: Secondary | ICD-10-CM | POA: Diagnosis not present

## 2019-10-11 DIAGNOSIS — Z79899 Other long term (current) drug therapy: Secondary | ICD-10-CM | POA: Diagnosis not present

## 2019-10-11 DIAGNOSIS — D701 Agranulocytosis secondary to cancer chemotherapy: Secondary | ICD-10-CM | POA: Diagnosis not present

## 2019-10-11 DIAGNOSIS — J029 Acute pharyngitis, unspecified: Secondary | ICD-10-CM

## 2019-10-11 DIAGNOSIS — Z5111 Encounter for antineoplastic chemotherapy: Secondary | ICD-10-CM | POA: Diagnosis not present

## 2019-10-11 DIAGNOSIS — D6959 Other secondary thrombocytopenia: Secondary | ICD-10-CM | POA: Diagnosis not present

## 2019-10-11 LAB — CMP (CANCER CENTER ONLY)
ALT: 6 U/L (ref 0–44)
AST: 11 U/L — ABNORMAL LOW (ref 15–41)
Albumin: 3.2 g/dL — ABNORMAL LOW (ref 3.5–5.0)
Alkaline Phosphatase: 68 U/L (ref 38–126)
Anion gap: 10 (ref 5–15)
BUN: 23 mg/dL (ref 8–23)
CO2: 23 mmol/L (ref 22–32)
Calcium: 8 mg/dL — ABNORMAL LOW (ref 8.9–10.3)
Chloride: 109 mmol/L (ref 98–111)
Creatinine: 0.84 mg/dL (ref 0.44–1.00)
GFR, Est AFR Am: >60 mL/min (ref >60)
GFR, Estimated: >60 mL/min (ref >60)
Glucose, Bld: 104 mg/dL — ABNORMAL HIGH (ref 70–99)
Potassium: 3.6 mmol/L (ref 3.5–5.1)
Sodium: 142 mmol/L (ref 135–145)
Total Bilirubin: 0.6 mg/dL (ref 0.3–1.2)
Total Protein: 6.4 g/dL — ABNORMAL LOW (ref 6.5–8.1)

## 2019-10-11 LAB — CBC WITH DIFFERENTIAL (CANCER CENTER ONLY)
Abs Immature Granulocytes: 0.05 10*3/uL (ref 0.00–0.07)
Basophils Absolute: 0 10*3/uL (ref 0.0–0.1)
Basophils Relative: 2 %
Eosinophils Absolute: 0.1 10*3/uL (ref 0.0–0.5)
Eosinophils Relative: 3 %
HCT: 36 % (ref 36.0–46.0)
Hemoglobin: 12.8 g/dL (ref 12.0–15.0)
Immature Granulocytes: 2 %
Lymphocytes Relative: 24 %
Lymphs Abs: 0.5 10*3/uL — ABNORMAL LOW (ref 0.7–4.0)
MCH: 30.9 pg (ref 26.0–34.0)
MCHC: 35.6 g/dL (ref 30.0–36.0)
MCV: 87 fL (ref 80.0–100.0)
Monocytes Absolute: 0 10*3/uL — ABNORMAL LOW (ref 0.1–1.0)
Monocytes Relative: 1 %
Neutro Abs: 1.4 10*3/uL — ABNORMAL LOW (ref 1.7–7.7)
Neutrophils Relative %: 68 %
Platelet Count: 58 10*3/uL — ABNORMAL LOW (ref 150–400)
RBC: 4.14 MIL/uL (ref 3.87–5.11)
RDW: 11.9 % (ref 11.5–15.5)
WBC Count: 2.1 10*3/uL — ABNORMAL LOW (ref 4.0–10.5)
nRBC: 0 % (ref 0.0–0.2)

## 2019-10-11 LAB — SAMPLE TO BLOOD BANK

## 2019-10-11 MED ORDER — SODIUM CHLORIDE 0.9 % IV SOLN
Freq: Once | INTRAVENOUS | Status: DC
  Filled 2019-10-11: qty 250

## 2019-10-11 MED ORDER — SODIUM CHLORIDE 0.9 % IV SOLN
Freq: Once | INTRAVENOUS | Status: AC
  Administered 2019-10-11: 16:00:00 via INTRAVENOUS
  Filled 2019-10-11: qty 250

## 2019-10-11 NOTE — Progress Notes (Addendum)
[facility]   [phone] Fax:([phone]) [phone]   Clinic Follow up Note   Patient Care Team: Hoyt Koch, MD as PCP - General (Internal Medicine) Inda Castle, MD (Inactive) (Gastroenterology) Nicholaus Bloom, MD (Anesthesiology) Arna Snipe, RN as Oncology Nurse Navigator 10/11/2019  CHIEF COMPLAINT: Anal cancer  CURRENT THERAPY: concurrent chemoRT with Mitomycin and 5FU on days 1-5 of week 1 and week 5, beginning 10/03/19   INTERVAL HISTORY: Joanne Klein presents in a wheelchair for symptom management visit. She began chemoRT on 10/12 with 5FU and mitomycin. Beginning on 10/16 she developed severe fatigue and dyspnea on exertion. She gets winded with smallest activity. Denies cough or chest pain. She can not stand to prepare meals. She goes from bathroom to bed. Her neighbor, niece, and nephew check on her and help with meals. She has a sore mouth and throat, also beginning 10/16. She tolerates soup and liquids. Carbonated drinks burn. She has occasional dizziness on standing and feels off balance. No fall, uses a cane. Bowels alternate between constipation and diarrhea more recently. She had 15 watery stools in last 25 hours, she took imodium yesterday and has not had a BM since 10/19. Occasional blood in stool and rectal pain are stable. Denies epistaxis or gum bleeding. She feels her skin is raw in her buttocks. Urine burns when is passes. Takes daily zofran and compazine for nausea, no vomiting. No fever or chills.    MEDICAL HISTORY:  Past Medical History:  Diagnosis Date  . Allergic rhinitis   . Arthritis   . Bursitis    Both Shoulders  . Chronic low back pain   . Degenerative disk disease   . Depression   . Diverticulitis   . Emphysema of lung ([place])   . GERD (gastroesophageal reflux disease)   . History of fibromyalgia   . Hypertension   . Hypothyroidism   . Irritable bowel syndrome   . Neuralgia, post-herpetic   . Obesity,  unspecified   . Obstructive sleep apnea   . OCD (obsessive compulsive disorder)   . Pain in joint, site unspecified   . Pure hypercholesterolemia   . Scoliosis   . Situational stress   . Spastic colon   . Tubular adenoma   . Uterine cancer [facility])     SURGICAL HISTORY: Past Surgical History:  Procedure Laterality Date  . BREAST LUMPECTOMY     knot removed  . CHOLECYSTECTOMY    . HYSTEROTOMY    . TONSILLECTOMY    . tumor in left forearm      I have reviewed the social history and family history with the patient and they are unchanged from previous note.  ALLERGIES:  is allergic to codeine; morphine and related; prozac [fluoxetine hcl]; and sulfa antibiotics.  MEDICATIONS:  Current Outpatient Medications  Medication Sig Dispense Refill  . atorvastatin (LIPITOR) 20 MG tablet Take 1 tablet (20 mg total) by mouth every other day. 45 tablet 3  . Cholecalciferol (VITAMIN D-3) 1000 UNITS CAPS Take by mouth.    . levothyroxine (SYNTHROID) 75 MCG tablet TAKE 1 TABLET BY MOUTH  DAILY BEFORE BREAKFAST 90 tablet 1  . omeprazole (PRILOSEC) 40 MG capsule TAKE 1 CAPSULE BY MOUTH  DAILY 90 capsule 3  . ondansetron (ZOFRAN) 8 MG tablet Take 1 tablet (8 mg total) by mouth every 8 (eight) hours as needed for nausea or vomiting. 60 tablet 1  . oxyCODONE-acetaminophen (PERCOCET) 10-325 MG tablet Take 1 tablet by mouth every 4 (four)  hours as needed for pain. Gets from another provider @@ 180 per month    . polyethylene glycol (MIRALAX / GLYCOLAX) packet Take 17 g by mouth daily.    . prochlorperazine (COMPAZINE) 10 MG tablet Take 1 tablet (10 mg total) by mouth every 6 (six) hours as needed. 60 tablet 1  . sertraline (ZOLOFT) 100 MG tablet Take 2 tablets (200 mg total) by mouth at bedtime. 180 tablet 3  . prochlorperazine (COMPAZINE) 10 MG tablet Take 1 tablet (10 mg total) by mouth every 6 (six) hours as needed. 20 tablet 0   Current Facility-Administered Medications  Medication Dose Route  Frequency Provider Last Rate Last Dose  . 0.9 %  sodium chloride infusion   Intravenous Once Alla Feeling, NP        PHYSICAL EXAMINATION: ECOG PERFORMANCE STATUS: 3 - Symptomatic, >50% confined to bed  Vitals:   10/11/19 1421 10/11/19 1425  BP: (!) 89/69 (!) 80/50  Pulse: 91 100  Resp:    Temp:    SpO2: 95% 96%   Filed Weights   10/11/19 1348  Weight: 233 lb 8 oz (105.9 kg)    GENERAL:alert, no distress and comfortable SKIN: no rash, poor turgor EYES:  sclera clear OROPHARYNX: no thrush or obvious ulcers in the mouth or tongue LUNGS: clear; respirations labored  with normal breathing effort HEART: regular rate & rhythm ABDOMEN:abdomen soft Musculoskeletal: left lower extremity slightly larger than right, no calf tenderness, erythema.  NEURO: alert & oriented x 3 with fluent speech, generalized weakness Rectal exam: external exam reveals perineal erythema with small ulcer at the anus PICC site at RUE without erythema or drainage  LABORATORY DATA:  I have reviewed the data as listed CBC Latest Ref Rng & Units 10/11/2019 10/03/2019 02/17/2019  WBC 4.0 - 10.5 K/uL 2.1(L) 7.0 7.5  Hemoglobin 12.0 - 15.0 g/dL 12.8 14.0 16.3(H)  Hematocrit 36.0 - 46.0 % 36.0 40.9 46.0  Platelets 150 - 400 K/uL 58(L) 152 196.0     CMP Latest Ref Rng & Units 10/11/2019 10/03/2019 02/17/2019  Glucose 70 - 99 mg/dL 104(H) 132(H) 95  BUN 8 - 23 mg/dL 23 28(H) 29(H)  Creatinine 0.44 - 1.00 mg/dL 0.84 1.08(H) 1.21(H)  Sodium 135 - 145 mmol/L 142 141 141  Potassium 3.5 - 5.1 mmol/L 3.6 3.8 4.8  Chloride 98 - 111 mmol/L 109 107 105  CO2 22 - 32 mmol/L 23 25 28   Calcium 8.9 - 10.3 mg/dL 8.0(L) 8.8(L) 9.6  Total Protein 6.5 - 8.1 g/dL 6.4(L) 7.0 7.5  Total Bilirubin 0.3 - 1.2 mg/dL 0.6 0.6 0.9  Alkaline Phos 38 - 126 U/L 68 83 79  AST 15 - 41 U/L 11(L) 13(L) 16  ALT 0 - 44 U/L 6 11 13       RADIOGRAPHIC STUDIES: I have personally reviewed the radiological images as listed and agreed with  the findings in the report. No results found.   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 Biopsy 08/30/2019 confirmed well-differentiated squamous cell carcinoma with positive P 16 and p63 stains ? CTs 09/14/2019- abnormal soft tissue fullness at the lower anus/perianal soft tissues, asymmetric left inguinal lymphadenopathy, borderline enlarged left external iliac node, mild stranding and cutaneous thickening of the left gluteal fold, 2 to 3 mm pulmonary nodules, 1.6 cm right hepatic lesion-likely a hemangioma ? Began concurrent chemoRT with 5FU/mitomycin on 10/03/2019  2. COPD 3. Fibromyalgia 4. Chronic back pain 5. Hypothyroid 6. Depression 7.  History of uterine cancer-age 40 8. Pain secondary to #1 9. Orthostatic hypotension, fatigue, dyspnea on exertion, early mucositis requiring supportive care on day 9, secondary to chemotherapy  10. Thrombocytopenia PLT 58K and neutropenia ANC 1.4 on day 9, secondary to chemotherapy   Disposition:  Joanne Klein appears stable for outpatient management. She is day 9 following 5FU/Mitomycin and concurrent RT starting 10/12. She developed severe fatigue, dyspnea on exertion, and sore throat. I feel her dyspnea is worsened by fatigue related to chemotherapy, she has COPD with dyspnea at baseline. She appears dehydrated as evident by orthostatic hypotension. She likely has early mucositis. Will prescribe magic mouthwash to swish and spit, she can also use OTC chloraseptic spray. She was strongly encouraged to push po liquids. Will support her with IV fluids today. She will continue imodium for diarrhea.   On exam, her left lower extremity is slightly larger than right, no calf tenderness. Low suspicion for DVT. I encouraged her to increase mobility and monitor her leg. She will call if she develops pain or increased swelling.   I reviewed her labs from today, CMP is stable. She developed thrombocytopenia PLT 58K and neutropenia ANC  1.4 secondary to chemotherapy. We reviewed bleeding and infection precautions. She will return on 10/13/19 for repeat labs, office visit, and supportive care. The patient was seen with Dr. Benay Spice today.   All questions were answered. The patient knows to call the clinic with any problems, questions or concerns. No barriers to learning was detected.     Alla Feeling, NP 10/11/19  This was a shared visit with Cira Rue.  Joanne Klein was interviewed and examined.  She is now day 9 following cycle one 5-FU/mitomycin-C.  She has developed symptoms of early mucositis, mild neutropenia, and thrombocytopenia.  She appears dehydrated today.  She will receive intravenous fluids today.  She will return for an office and lab visit 10/13/2019.  She reports diarrhea has resolved today.  Julieanne Manson, MD

## 2019-10-11 NOTE — Patient Instructions (Signed)
Hypotension °As your heart beats, it forces blood through your body. This force is called blood pressure. If you have hypotension, you have low blood pressure. When your blood pressure is too low, you may not get enough blood to your brain or other parts of your body. This may cause you to feel weak, light-headed, have a fast heartbeat, or even pass out (faint). Low blood pressure may be harmless, or it may cause serious problems. °What are the causes? °· Blood loss. °· Not enough water in the body (dehydration). °· Heart problems. °· Hormone problems. °· Pregnancy. °· A very bad infection. °· Not having enough of certain nutrients. °· Very bad allergic reactions. °· Certain medicines. °What increases the risk? °· Age. The risk increases as you get older. °· Conditions that affect the heart or the brain and spinal cord (central nervous system). °· Taking certain medicines. °· Being pregnant. °What are the signs or symptoms? °· Feeling: °? Weak. °? Light-headed. °? Dizzy. °? Tired (fatigued). °· Blurred vision. °· Fast heartbeat. °· Passing out, in very bad cases. °How is this treated? °· Changing your diet. This may involve eating more salt (sodium) or drinking more water. °· Taking medicines to raise your blood pressure. °· Changing how much you take (the dosage) of some of your medicines. °· Wearing compression stockings. These stockings help to prevent blood clots and reduce swelling in your legs. °In some cases, you may need to go to the hospital for: °· Fluid replacement. This means you will receive fluids through an IV tube. °· Blood replacement. This means you will receive donated blood through an IV tube (transfusion). °· Treating an infection or heart problems, if this applies. °· Monitoring. You may need to be monitored while medicines that you are taking wear off. °Follow these instructions at home: °Eating and drinking ° °· Drink enough fluids to keep your pee (urine) pale yellow. °· Eat a healthy diet.  Follow instructions from your doctor about what you can eat or drink. A healthy diet includes: °? Fresh fruits and vegetables. °? Whole grains. °? Low-fat (lean) meats. °? Low-fat dairy products. °· Eat extra salt only as told. Do not add extra salt to your diet unless your doctor tells you to. °· Eat small meals often. °· Avoid standing up quickly after you eat. °Medicines °· Take over-the-counter and prescription medicines only as told by your doctor. °? Follow instructions from your doctor about changing how much you take of your medicines, if this applies. °? Do not stop or change any of your medicines on your own. °General instructions ° °· Wear compression stockings as told by your doctor. °· Get up slowly from lying down or sitting. °· Avoid hot showers and a lot of heat as told by your doctor. °· Return to your normal activities as told by your doctor. Ask what activities are safe for you. °· Do not use any products that contain nicotine or tobacco, such as cigarettes, e-cigarettes, and chewing tobacco. If you need help quitting, ask your doctor. °· Keep all follow-up visits as told by your doctor. This is important. °Contact a doctor if: °· You throw up (vomit). °· You have watery poop (diarrhea). °· You have a fever for more than 2-3 days. °· You feel more thirsty than normal. °· You feel weak and tired. °Get help right away if: °· You have chest pain. °· You have a fast or uneven heartbeat. °· You lose feeling (have numbness) in any   part of your body. °· You cannot move your arms or your legs. °· You have trouble talking. °· You get sweaty or feel light-headed. °· You pass out. °· You have trouble breathing. °· You have trouble staying awake. °· You feel mixed up (confused). °Summary °· Hypotension is also called low blood pressure. It is when the force of blood pumping through your arteries is too weak. °· Hypotension may be harmless, or it may cause serious problems. °· Treatment may include changing  your diet and medicines, and wearing compression stockings. °· In very bad cases, you may need to go to the hospital. °This information is not intended to replace advice given to you by your health care provider. Make sure you discuss any questions you have with your health care provider. °Document Released: 03/04/2010 Document Revised: 06/03/2018 Document Reviewed: 06/03/2018 °Elsevier Patient Education © 2020 Elsevier Inc. ° °

## 2019-10-12 ENCOUNTER — Other Ambulatory Visit: Payer: Self-pay

## 2019-10-12 ENCOUNTER — Telehealth: Payer: Self-pay | Admitting: Nurse Practitioner

## 2019-10-12 ENCOUNTER — Emergency Department (HOSPITAL_COMMUNITY): Payer: Medicare Other

## 2019-10-12 ENCOUNTER — Emergency Department (HOSPITAL_BASED_OUTPATIENT_CLINIC_OR_DEPARTMENT_OTHER): Payer: Medicare Other

## 2019-10-12 ENCOUNTER — Emergency Department (HOSPITAL_COMMUNITY)
Admission: EM | Admit: 2019-10-12 | Discharge: 2019-10-12 | Disposition: A | Discharge status: Home / Self Care | Payer: Medicare Other | Attending: Emergency Medicine | Admitting: Emergency Medicine

## 2019-10-12 ENCOUNTER — Other Ambulatory Visit: Payer: Self-pay | Admitting: *Deleted

## 2019-10-12 ENCOUNTER — Ambulatory Visit
Admission: RE | Admit: 2019-10-12 | Discharge: 2019-10-12 | Disposition: A | Discharge status: Home / Self Care | Payer: Medicare Other | Source: Ambulatory Visit | Attending: Radiation Oncology | Admitting: Radiation Oncology

## 2019-10-12 ENCOUNTER — Encounter (HOSPITAL_COMMUNITY): Payer: Self-pay | Admitting: Emergency Medicine

## 2019-10-12 ENCOUNTER — Telehealth: Payer: Self-pay | Admitting: *Deleted

## 2019-10-12 DIAGNOSIS — M79662 Pain in left lower leg: Secondary | ICD-10-CM | POA: Diagnosis not present

## 2019-10-12 DIAGNOSIS — R0602 Shortness of breath: Secondary | ICD-10-CM

## 2019-10-12 DIAGNOSIS — I10 Essential (primary) hypertension: Secondary | ICD-10-CM | POA: Diagnosis not present

## 2019-10-12 DIAGNOSIS — C210 Malignant neoplasm of anus, unspecified: Secondary | ICD-10-CM | POA: Insufficient documentation

## 2019-10-12 DIAGNOSIS — R609 Edema, unspecified: Secondary | ICD-10-CM | POA: Diagnosis not present

## 2019-10-12 DIAGNOSIS — R52 Pain, unspecified: Secondary | ICD-10-CM | POA: Diagnosis not present

## 2019-10-12 DIAGNOSIS — M7989 Other specified soft tissue disorders: Secondary | ICD-10-CM | POA: Diagnosis not present

## 2019-10-12 DIAGNOSIS — D696 Thrombocytopenia, unspecified: Secondary | ICD-10-CM | POA: Insufficient documentation

## 2019-10-12 DIAGNOSIS — E039 Hypothyroidism, unspecified: Secondary | ICD-10-CM | POA: Diagnosis not present

## 2019-10-12 DIAGNOSIS — Z8542 Personal history of malignant neoplasm of other parts of uterus: Secondary | ICD-10-CM | POA: Diagnosis not present

## 2019-10-12 DIAGNOSIS — J449 Chronic obstructive pulmonary disease, unspecified: Secondary | ICD-10-CM | POA: Diagnosis not present

## 2019-10-12 DIAGNOSIS — Z87891 Personal history of nicotine dependence: Secondary | ICD-10-CM | POA: Diagnosis not present

## 2019-10-12 DIAGNOSIS — Z79899 Other long term (current) drug therapy: Secondary | ICD-10-CM | POA: Insufficient documentation

## 2019-10-12 DIAGNOSIS — T451X5A Adverse effect of antineoplastic and immunosuppressive drugs, initial encounter: Secondary | ICD-10-CM | POA: Diagnosis not present

## 2019-10-12 DIAGNOSIS — D701 Agranulocytosis secondary to cancer chemotherapy: Secondary | ICD-10-CM | POA: Diagnosis not present

## 2019-10-12 DIAGNOSIS — R2242 Localized swelling, mass and lump, left lower limb: Secondary | ICD-10-CM | POA: Insufficient documentation

## 2019-10-12 DIAGNOSIS — D702 Other drug-induced agranulocytosis: Secondary | ICD-10-CM | POA: Diagnosis not present

## 2019-10-12 DIAGNOSIS — R079 Chest pain, unspecified: Secondary | ICD-10-CM | POA: Diagnosis not present

## 2019-10-12 LAB — COMPREHENSIVE METABOLIC PANEL
ALT: 10 U/L (ref 0–44)
AST: 14 U/L — ABNORMAL LOW (ref 15–41)
Albumin: 3.5 g/dL (ref 3.5–5.0)
Alkaline Phosphatase: 62 U/L (ref 38–126)
Anion gap: 7 (ref 5–15)
BUN: 20 mg/dL (ref 8–23)
CO2: 23 mmol/L (ref 22–32)
Calcium: 8.1 mg/dL — ABNORMAL LOW (ref 8.9–10.3)
Chloride: 109 mmol/L (ref 98–111)
Creatinine, Ser: 0.89 mg/dL (ref 0.44–1.00)
GFR calc Af Amer: >60 mL/min (ref >60)
GFR calc non Af Amer: >60 mL/min (ref >60)
Glucose, Bld: 102 mg/dL — ABNORMAL HIGH (ref 70–99)
Potassium: 3.6 mmol/L (ref 3.5–5.1)
Sodium: 139 mmol/L (ref 135–145)
Total Bilirubin: 1.3 mg/dL — ABNORMAL HIGH (ref 0.3–1.2)
Total Protein: 6.6 g/dL (ref 6.5–8.1)

## 2019-10-12 LAB — CBC WITH DIFFERENTIAL/PLATELET
Abs Immature Granulocytes: 0.23 10*3/uL — ABNORMAL HIGH (ref 0.00–0.07)
Basophils Absolute: 0 10*3/uL (ref 0.0–0.1)
Basophils Relative: 1 %
Eosinophils Absolute: 0.1 10*3/uL (ref 0.0–0.5)
Eosinophils Relative: 4 %
HCT: 36.8 % (ref 36.0–46.0)
Hemoglobin: 12.6 g/dL (ref 12.0–15.0)
Immature Granulocytes: 16 %
Lymphocytes Relative: 39 %
Lymphs Abs: 0.6 10*3/uL — ABNORMAL LOW (ref 0.7–4.0)
MCH: 30.4 pg (ref 26.0–34.0)
MCHC: 34.2 g/dL (ref 30.0–36.0)
MCV: 88.9 fL (ref 80.0–100.0)
Monocytes Absolute: 0 10*3/uL — ABNORMAL LOW (ref 0.1–1.0)
Monocytes Relative: 0 %
Neutro Abs: 0.6 10*3/uL — ABNORMAL LOW (ref 1.7–7.7)
Neutrophils Relative %: 40 %
Platelets: 46 10*3/uL — ABNORMAL LOW (ref 150–400)
RBC: 4.14 MIL/uL (ref 3.87–5.11)
RDW: 11.9 % (ref 11.5–15.5)
WBC: 1.4 10*3/uL — CL (ref 4.0–10.5)
nRBC: 0 % (ref 0.0–0.2)

## 2019-10-12 LAB — TROPONIN I (HIGH SENSITIVITY)
Troponin I (High Sensitivity): 5 ng/L (ref <18)
Troponin I (High Sensitivity): 4 ng/L (ref <18)

## 2019-10-12 MED ORDER — MAGIC MOUTHWASH: 5.0000 mL | Freq: Four times a day (QID) | ORAL | 1 refills | Status: DC | PRN

## 2019-10-12 MED ORDER — IOHEXOL 350 MG/ML SOLN
100.0000 mL | Freq: Once | INTRAVENOUS | Status: AC | PRN
  Administered 2019-10-12: 21:00:00 100 mL via INTRAVENOUS

## 2019-10-12 NOTE — Telephone Encounter (Signed)
Left VM that her mouth/lips/tongue are swollen and tender. Difficult for her to spit, just has to let saliva drip out of her mouth. Having occasional mild nosebleed from right nostril. Reports her rectum is too tender to even wipe/clean now. Also reports her left leg is getting bigger. Sent message to Dr. Ida Rogue nurse to please see her today regarding her rectal skin issue. Discussed above symptoms with PA in [facility], who states she need to go to ER. Most likley will need venous doppler tonight to r/o DVT and possible PE with the SOB she has been having. Spoke with Dr. Ida Rogue nurse, Phineas Real who reports patient never mentioned the swollen leg or mouth/lip soreness and patient had just left. Called patient and left detailed message that the [facility] was called in and she needs to go to the emergency room to be evaluated for a blood clot.

## 2019-10-12 NOTE — ED Triage Notes (Signed)
Patient arrived by Ems from home. Pt c/o leg pain, swelling, and SOB x 3 days ago.   Patient has pain when she's standing on legs.   Pt has rectal cancer and is currently receiving treatment for it.

## 2019-10-12 NOTE — ED Provider Notes (Signed)
Medical screening examination/treatment/procedure(s) were conducted as a shared visit with non-physician practitioner(s) and myself.  I personally evaluated the patient during the encounter.     Patient seen by me along with physician assistant.  ED ECG REPORT   Date: 10/12/2019  Rate: 78  Rhythm: normal sinus rhythm  QRS Axis: left  Intervals: normal  ST/T Wave abnormalities: normal  Conduction Disutrbances:none  Narrative Interpretation:   Old EKG Reviewed: none available  I have personally reviewed the EKG tracing and agree with the computerized printout as noted.       Fredia Sorrow, MD 10/19/19 [id number]

## 2019-10-12 NOTE — Progress Notes (Signed)
Lower extremity venous has been completed.   Preliminary results in CV Proc.   Abram Sander 10/12/2019 7:05 PM

## 2019-10-12 NOTE — ED Notes (Signed)
Pt verbalized discharge instructions and follow up care. No IV. Alert and assisted into vehicle by staff. Daughter driving home.

## 2019-10-12 NOTE — ED Notes (Signed)
Patient transported to X-ray 

## 2019-10-12 NOTE — ED Provider Notes (Signed)
Medical screening examination/treatment/procedure(s) were conducted as a shared visit with non-physician practitioner(s) and myself.  I personally evaluated the patient during the encounter.     Patient seen by me along with physician assistant.  Patient with a history of anal cancer followed by hematology oncology.  Patient referred in for concerns for possible DVT of the legs were possible pulmonary embolus.  Patient had Doppler studies done no preliminary findings provided.  But looking at the's study data most likely negative.  CT angio chest without evidence of any pulmonary embolus.  Patient had some worsening leukopenia and some neutropenia.  And platelets were lower than baseline.  Discussed with on-call hematologist oncologist.  Patient cleared for discharge home.  She will have follow-up in hematology oncology clinic tomorrow.   Fredia Sorrow, MD 10/12/19 2255

## 2019-10-12 NOTE — ED Notes (Signed)
Patient transported to CT 

## 2019-10-12 NOTE — ED Notes (Signed)
Pt given water and sandwich per verbal from PA. No difficulty swallowing

## 2019-10-12 NOTE — Telephone Encounter (Signed)
Scheduled appt per 10/20 los.  Spoke with pt and she is aware of her appt date and time,

## 2019-10-12 NOTE — ED Provider Notes (Signed)
[facility] DEPT Provider Note   CSN: [id number] Arrival date & time: 10/12/19  1730     History   Chief Complaint Chief Complaint  Patient presents with  . Leg Swelling  . Shortness of Breath    HPI Joanne Klein is a 73 y.o. female with history of anal cancer with current chemoradiation therapy that began on 10/03/2019, chronic back pain, diverticulitis, emphysema, hypertension, obesity, OSA, hyperlipidemia presents to the ER for evaluation of shortness of breath associated with left leg swelling. Reports long history of mild shortness of breath "always" that she attributes to her emphysema and waking up gasping for air but on Friday she had slightly worsening shortness of breath especially when she is up and getting around and moving.  She saw her oncologist yesterday and they noticed her left leg was swollen, states she had never noticed this.  Was seen by her doctor yesterday and they told her she was dehydrated so they gave her IV fluids.  Her last treatment was today.  She called her oncologist today because when she woke up she noticed her left leg was larger, she was instructed to come to the ER.  Patient states she had a "bad reaction" to her first chemotherapy treatment on 10/12.  Since she has had nausea, intermittent generalized abdominal cramping, large-volume diarrhea, tongue swelling, sore throat and pain with swallowing.  She had diarrhea a couple of days ago but her last BM was 10/19.  Has had occasional rectal and anal bleeding with pain since her anal cancer diagnosis. Has had intermittent burning with urination. She is waiting for a prescription for a mouthwash.    She had fleeting sharp left breast pain while sitting down today this morning, but no pleuritic CP. No fever, vomiting. No cough. No h/o DVT/PE. No h/o heart failure.      HPI  Past Medical History:  Diagnosis Date  . Allergic rhinitis   . Arthritis   . Bursitis    Both  Shoulders  . Chronic low back pain   . Degenerative disk disease   . Depression   . Diverticulitis   . Emphysema of lung ([place])   . GERD (gastroesophageal reflux disease)   . History of fibromyalgia   . Hypertension   . Hypothyroidism   . Irritable bowel syndrome   . Neuralgia, post-herpetic   . Obesity, unspecified   . Obstructive sleep apnea   . OCD (obsessive compulsive disorder)   . Pain in joint, site unspecified   . Pure hypercholesterolemia   . Scoliosis   . Situational stress   . Spastic colon   . Tubular adenoma   . Uterine cancer [facility])     Patient Active Problem List   Diagnosis Date Noted  . Anal cancer ([place]) 09/15/2019  . Routine general medical examination at a health care facility 11/19/2016  . 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 Morbid obesity ([place]) 06/26/2014  . COPD GOLD II    . Hyperlipidemia   . Arthritis 01/16/2014  . Depression 01/13/2014  . Hypothyroidism 01/13/2014  . GERD (gastroesophageal reflux disease) 01/13/2014  . OCD (obsessive compulsive disorder) 01/13/2014  . Fibromyalgia 01/13/2014    Past Surgical History:  Procedure Laterality Date  . BREAST LUMPECTOMY     knot removed  . CHOLECYSTECTOMY    . HYSTEROTOMY    . TONSILLECTOMY    . tumor in left forearm       OB History   No obstetric history on file.  Home Medications    Prior to Admission medications   Medication Sig Start Date End Date Taking? Authorizing Provider  atorvastatin (LIPITOR) 20 MG tablet Take 1 tablet (20 mg total) by mouth every other day. 02/17/19  Yes Hoyt Koch, MD  Cholecalciferol (VITAMIN D-3) 1000 UNITS CAPS Take 1 capsule by mouth daily.    Yes [provider]  levothyroxine (SYNTHROID) 75 MCG tablet TAKE 1 TABLET BY MOUTH  DAILY BEFORE BREAKFAST Patient taking differently: Take 75 mcg by mouth daily before breakfast.  08/31/19  Yes Hoyt Koch, MD  omeprazole (PRILOSEC) 40 MG capsule TAKE 1 CAPSULE BY MOUTH  DAILY 04/18/19  Yes  Hoyt Koch, MD  ondansetron (ZOFRAN) 8 MG tablet Take 1 tablet (8 mg total) by mouth every 8 (eight) hours as needed for nausea or vomiting. 09/23/19  Yes Ladell Pier, MD  oxyCODONE-acetaminophen (PERCOCET) 10-325 MG tablet Take 1 tablet by mouth every 4 (four) hours as needed for pain. Gets from another provider @@ 180 per month   Yes [provider]  prochlorperazine (COMPAZINE) 10 MG tablet Take 1 tablet (10 mg total) by mouth every 6 (six) hours as needed. Patient taking differently: Take 10 mg by mouth every 6 (six) hours as needed for nausea or vomiting.  10/03/19  Yes Ladell Pier, MD  sertraline (ZOLOFT) 100 MG tablet Take 2 tablets (200 mg total) by mouth at bedtime. 02/17/19  Yes Hoyt Koch, MD  magic mouthwash SOLN Take 5 mLs by mouth 4 (four) times daily as needed for mouth pain. Swish and spit 10/12/19   Ladell Pier, MD  prochlorperazine (COMPAZINE) 10 MG tablet Take 1 tablet (10 mg total) by mouth every 6 (six) hours as needed. Patient not taking: Reported on 10/12/2019 09/23/19   Ladell Pier, MD    Family History Family History  Problem Relation Age of Onset  . Stomach cancer Father   . Liver cancer Father   . Diabetes Father   . Colon cancer Neg Hx     Social History Social History   Tobacco Use  . Smoking status: Former Smoker    Packs/day: 2.00    Years: 34.00    Pack years: 68.00    Types: Cigarettes    Quit date: 12/15/1996    Years since quitting: 22.8  . Smokeless tobacco: Never Used  . Tobacco comment: vapes x 2 years  Substance Use Topics  . Alcohol use: No    Alcohol/week: 0.0 standard drinks    Comment: Caffine Intake 2x's daily  . Drug use: No     Allergies   Codeine, Morphine and related, Prozac [fluoxetine hcl], and Sulfa antibiotics   Review of Systems Review of Systems  Constitutional: Positive for fatigue.  HENT: Positive for sore throat and trouble swallowing.   Respiratory: Positive for  shortness of breath.   Cardiovascular: Positive for chest pain.  Gastrointestinal: Positive for abdominal pain, diarrhea and nausea.  Genitourinary: Positive for dysuria.  Allergic/Immunologic: Positive for immunocompromised state.  All other systems reviewed and are negative.    Physical Exam Updated Vital Signs BP (!) 141/78   Pulse 79   Temp 98.4 F (36.9 C) (Oral)   Resp 11   Ht 5\' 8"  (1.727 m)   Wt 105.9 kg   SpO2 99%   BMI 35.50 kg/m   Physical Exam Vitals signs and nursing note reviewed.  Constitutional:      Appearance: She is well-developed.  Comments: Non toxic in NAD  HENT:     Head: Normocephalic and atraumatic.     Nose: Nose normal.     Mouth/Throat:     Comments: Dry lips but MMM. No intraoral or oropharyngeal lesions. No trismus. Oropharynx and tonsils normal.  Eyes:     Conjunctiva/sclera: Conjunctivae normal.  Neck:     Musculoskeletal: Normal range of motion.  Cardiovascular:     Rate and Rhythm: Normal rate and regular rhythm.     Comments: 1+ DP and radial pulses bilaterally.  No significant or asymmetric lower extremity edema.  Mild diffuse tenderness to the left posterior calf. Pulmonary:     Effort: Pulmonary effort is normal.     Breath sounds: Normal breath sounds.     Comments: Speaking in full sentences.  Diminished lung sounds to lower lobes, difficult exam due to body habitus.  No crackles.  No wheezing.  SPO2 greater than 95% on room air. Abdominal:     General: Bowel sounds are normal.     Palpations: Abdomen is soft.     Tenderness: There is no abdominal tenderness.     Comments: No G/R/R. No suprapubic or CVA tenderness. Negative Murphy's and McBurney's. Active BS to lower quadrants.   Musculoskeletal: Normal range of motion.  Skin:    General: Skin is warm and dry.     Capillary Refill: Capillary refill takes less than 2 seconds.  Neurological:     Mental Status: She is alert.  Psychiatric:        Behavior: Behavior normal.       ED Treatments / Results  Labs (all labs ordered are listed, but only abnormal results are displayed) Labs Reviewed  COMPREHENSIVE METABOLIC PANEL - Abnormal; Notable for the following components:      Result Value   Glucose, Bld 102 (*)    Calcium 8.1 (*)    AST 14 (*)    Total Bilirubin 1.3 (*)    All other components within normal limits  CBC WITH DIFFERENTIAL/PLATELET - Abnormal; Notable for the following components:   WBC 1.4 (*)    Platelets 46 (*)    Neutro Abs 0.6 (*)    Lymphs Abs 0.6 (*)    Monocytes Absolute 0.0 (*)    Abs Immature Granulocytes 0.23 (*)    All other components within normal limits  TROPONIN I (HIGH SENSITIVITY)  TROPONIN I (HIGH SENSITIVITY)    EKG None  Radiology Dg Chest 2 View  Result Date: 10/12/2019 CLINICAL DATA:  Shortness of breath. Leg pain and swelling. Currently undergoing treatment for anal cancer. EXAM: CHEST - 2 VIEW COMPARISON:  05/26/2014 FINDINGS: The heart size and mediastinal contours are within normal limits. Both lungs are clear. The visualized skeletal structures are unremarkable. IMPRESSION: No active cardiopulmonary disease. Electronically Signed   By: Lorriane Shire M.D.   On: 10/12/2019 19:10   Ct Angio Chest Pe W And/or Wo Contrast  Result Date: 10/12/2019 CLINICAL DATA:  Pt c/o leg pain, swelling, and SOB x 3 days ago. Patient has pain when she's standing on legs. Pt has rectal cancer and is currently receiving treatment for it. ^188mL OMNIPAQUE IOHEXOL 350 MG/ML SOLNShortness of breath shortness of breath h/o cancer with active treatment EXAM: CT ANGIOGRAPHY CHEST WITH CONTRAST TECHNIQUE: Multidetector CT imaging of the chest was performed using the standard protocol during bolus administration of intravenous contrast. Multiplanar CT image reconstructions and MIPs were obtained to evaluate the vascular anatomy. CONTRAST:  186mL OMNIPAQUE IOHEXOL 350  MG/ML SOLN COMPARISON:  09/14/2019 FINDINGS: Cardiovascular: No  filling defects within the pulmonary arteries to suggest acute pulmonary embolism. No acute findings of the aorta or great vessels. No pericardial fluid. Mediastinum/Nodes: No axillary supraclavicular adenopathy. No mediastinal hilar adenopathy no pericardial effusion. Esophagus normal Lungs/Pleura: No pulmonary infarction. Pneumonia. Centrilobular emphysema the upper lobes. No bronchiectasis. No suspicious nodules. Limited view of the liver, kidneys, pancreas are unremarkable. Normal adrenal glands. Upper Abdomen: Limited view of the liver, kidneys, pancreas are unremarkable. Normal adrenal glands. Musculoskeletal: No aggressive osseous lesion. Review of the MIP images confirms the above findings. IMPRESSION: 1. No evidence acute pulmonary embolism. 2. No acute pulmonary parenchymal findings. Aortic Atherosclerosis (ICD10-I70.0) and Emphysema (ICD10-J43.9). Electronically Signed   By: Suzy Bouchard M.D.   On: 10/12/2019 20:59   Vas [place] Lower Extremity Venous (dvt) (only Mc & Wl 7a-7p)  Result Date: 10/12/2019  Lower Venous Study Indications: Swelling, and Edema.  Comparison Study: no prior Performing Technologist: Abram Sander RVS  Examination Guidelines: A complete evaluation includes B-mode imaging, spectral Doppler, color Doppler, and power Doppler as needed of all accessible portions of each vessel. Bilateral testing is considered an integral part of a complete examination. Limited examinations for reoccurring indications may be performed as noted.  +---------+---------------+---------+-----------+----------+--------------+ RIGHT    CompressibilityPhasicitySpontaneityPropertiesThrombus Aging +---------+---------------+---------+-----------+----------+--------------+ CFV      Full           Yes      Yes                                 +---------+---------------+---------+-----------+----------+--------------+ SFJ      Full                                                         +---------+---------------+---------+-----------+----------+--------------+ FV Prox  Full                                                        +---------+---------------+---------+-----------+----------+--------------+ FV Mid   Full                                                        +---------+---------------+---------+-----------+----------+--------------+ FV DistalFull                                                        +---------+---------------+---------+-----------+----------+--------------+ PFV      Full                                                        +---------+---------------+---------+-----------+----------+--------------+ POP      Full  Yes      Yes                                 +---------+---------------+---------+-----------+----------+--------------+ PTV      Full                                                        +---------+---------------+---------+-----------+----------+--------------+ PERO                                                  Not visualized +---------+---------------+---------+-----------+----------+--------------+   +---------+---------------+---------+-----------+----------+--------------+ LEFT     CompressibilityPhasicitySpontaneityPropertiesThrombus Aging +---------+---------------+---------+-----------+----------+--------------+ CFV      Full           Yes      Yes                                 +---------+---------------+---------+-----------+----------+--------------+ SFJ      Full                                                        +---------+---------------+---------+-----------+----------+--------------+ FV Prox  Full                                                        +---------+---------------+---------+-----------+----------+--------------+ FV Mid   Full                                                         +---------+---------------+---------+-----------+----------+--------------+ FV DistalFull                                                        +---------+---------------+---------+-----------+----------+--------------+ PFV      Full                                                        +---------+---------------+---------+-----------+----------+--------------+ POP      Full           Yes      Yes                                 +---------+---------------+---------+-----------+----------+--------------+ PTV      Full                                                        +---------+---------------+---------+-----------+----------+--------------+  PERO                                                  Not visualized +---------+---------------+---------+-----------+----------+--------------+     *See table(s) above for measurements and observations.    Preliminary     Procedures Procedures (including critical care time)  Medications Ordered in ED Medications  iohexol (OMNIPAQUE) 350 MG/ML injection 100 mL (100 mLs Intravenous Contrast Given 10/12/19 2032)     Initial Impression / Assessment and Plan / ED Course  I have reviewed the triage vital signs and the nursing notes.  Pertinent labs & imaging results that were available during my care of the patient were reviewed by me and considered in my medical decision making (see chart for details).  Clinical Course as of Oct 11 2320  Wed Oct 12, 2019  2006 WBC(!!): 1.4 [CG]  2006 Platelets(!): 46 [CG]  2006 NEUT#(!): 0.6 [CG]  2007 Lymphocyte #(!): 0.6 [CG]  2007 Monocyte #(!): 0.0 [CG]  2007 Abs Immature Granulocytes(!): 0.23 [CG]  2007 Troponin I (High Sensitivity): 5 [CG]  2132 Troponin I (High Sensitivity): 4 [CG]  2209 Reevaluated patient.  No clinical decline.  She ambulated with RN with SPO2 greater than 98%.   [CG]  2316 Negative  CT Angio Chest PE W and/or Wo Contrast [CG]  2316 Reviewed with EDP,  no formal preliminary results but based on a review no DVT.  VAS [place] LOWER EXTREMITY VENOUS (DVT) (ONLY [facility] & [facility] 7a-7p) [CG]    Clinical Course User Index [CG] Kinnie Feil, PA-C   EMR reviewed by me.  Oncology notes reviewed.  She last had chemo radiation therapy today.  ER work-up reviewed by me remarkable as above.  Leukopenia with WBC trending down from yesterday.  Thrombocytopenia slight decrease from yesterday as well.  Neutrophil count is low as well.  She has no fever.    Chest x-ray is negative without edema, infiltrates, opacities, cardiomegaly, pneumothorax.  Hemoglobin is normal.  Vascular ultrasound reviewed with EDP and no signs to suggest DVT although preliminary report not on chart.  Given her risk and calf tenderness, CTA was obtained which was completely normal.  EKG is without ischemia.  Troponin x2 unremarkable.  She had fleeting left sharp chest pain this morning but has not had any exertional chest pain.  She has no known CAD.  Patient reevaluated and no clinical decline.  Repeat abdominal exam is unchanged.  Her last episode of diarrhea was 2 days ago.  Patient ambulated in the ER with no hypoxemia.  Given benign work-up highest on DDX is generalized fatigue clinic shortness of breath related to her chemotherapy.  Work-up today is not suggestive of cardiac ischemia given her unremarkable EKG, troponins and atypical sounding chest pain earlier today.  No infectious symptoms to point to pneumonia, COVID-19.  No anemia.  She does not look hypervolemic and has no history of CHF, no pulmonary edema on her x-ray.  Discussed worsening CBC with Dr. Alen Blew who deemed her appropriate for discharge.  She has appointment with oncology tomorrow.  Shared with EDP.  Discussed plan with patient who is comfortable with discharge and follow-up. Final Clinical Impressions(s) / ED Diagnoses   Final diagnoses:  Shortness of breath  Pain of left calf  Chemotherapy-induced neutropenia  (HCC)  Thrombocytopenia [place])    ED Discharge Orders  None       Arlean Hopping 10/12/19 2321    Fredia Sorrow, MD 10/19/19 [phone]

## 2019-10-12 NOTE — Discharge Instructions (Signed)
You were seen in the ER for shortness of breath and left leg pain.  Work-up today in the ER was normal.  No evidence of blood clot in your leg or in your lungs.  We spoke to Dr. Alen Blew regarding your low white blood cell count, low platelets and he thought it was okay for you to be discharged.  Go to your appointment tomorrow as scheduled.  Return for fever greater than 100, cough, chest pain or shortness of breath with exertion, worsening abdominal pain.

## 2019-10-12 NOTE — ED Notes (Signed)
Pt ambulated to the bathroom with minimal assistance. Gait steady. SpO2 remained at 98%

## 2019-10-12 NOTE — Progress Notes (Signed)
This patient was seen for extra COVID-19 screening at the front reception desk.  She reports having a sore throat since Friday evening.  She has diarrhea.  She has been treated with 5-FU and mitomycin along with concurrent radiation therapy.  She has no cough, fevers, chills, sweats, headache, or other respiratory symptoms.  She has had no sick contacts at home.  A brief exam at the front desk showed that her oropharynx was clear except for erythema.  This was discussed with Dr. Benay Spice believes that her symptoms could be related to her chemotherapy which can often cause mucositis.  The patient was cleared to proceed with appointments or treatments today.  Sandi Mealy, MHS, PA-C Physician Assistant

## 2019-10-13 ENCOUNTER — Telehealth: Payer: Self-pay | Admitting: Emergency Medicine

## 2019-10-13 ENCOUNTER — Other Ambulatory Visit: Payer: Self-pay

## 2019-10-13 ENCOUNTER — Telehealth: Payer: Self-pay | Admitting: Hematology

## 2019-10-13 ENCOUNTER — Encounter: Payer: Self-pay | Admitting: Nurse Practitioner

## 2019-10-13 ENCOUNTER — Inpatient Hospital Stay: Payer: Medicare Other

## 2019-10-13 ENCOUNTER — Telehealth: Payer: Self-pay

## 2019-10-13 ENCOUNTER — Inpatient Hospital Stay: Payer: Medicare Other | Admitting: Nurse Practitioner

## 2019-10-13 ENCOUNTER — Ambulatory Visit
Admission: RE | Admit: 2019-10-13 | Discharge: 2019-10-13 | Disposition: A | Discharge status: Home / Self Care | Payer: Medicare Other | Source: Ambulatory Visit | Attending: Radiation Oncology | Admitting: Radiation Oncology

## 2019-10-13 VITALS — BP 108/73 | HR 72 | Temp 98.0°F | Resp 16 | Wt 233.1 lb

## 2019-10-13 DIAGNOSIS — Z79899 Other long term (current) drug therapy: Secondary | ICD-10-CM | POA: Diagnosis not present

## 2019-10-13 DIAGNOSIS — C210 Malignant neoplasm of anus, unspecified: Secondary | ICD-10-CM

## 2019-10-13 DIAGNOSIS — D6959 Other secondary thrombocytopenia: Secondary | ICD-10-CM | POA: Diagnosis not present

## 2019-10-13 DIAGNOSIS — D701 Agranulocytosis secondary to cancer chemotherapy: Secondary | ICD-10-CM | POA: Diagnosis not present

## 2019-10-13 DIAGNOSIS — Z5111 Encounter for antineoplastic chemotherapy: Secondary | ICD-10-CM | POA: Diagnosis not present

## 2019-10-13 DIAGNOSIS — R918 Other nonspecific abnormal finding of lung field: Secondary | ICD-10-CM | POA: Diagnosis not present

## 2019-10-13 DIAGNOSIS — Z510 Encounter for antineoplastic radiation therapy: Secondary | ICD-10-CM | POA: Diagnosis not present

## 2019-10-13 LAB — CBC WITH DIFFERENTIAL (CANCER CENTER ONLY)
Abs Immature Granulocytes: 0 10*3/uL (ref 0.00–0.07)
Basophils Absolute: 0 10*3/uL (ref 0.0–0.1)
Basophils Relative: 1 %
Eosinophils Absolute: 0 10*3/uL (ref 0.0–0.5)
Eosinophils Relative: 3 %
HCT: 32.8 % — ABNORMAL LOW (ref 36.0–46.0)
Hemoglobin: 11.5 g/dL — ABNORMAL LOW (ref 12.0–15.0)
Lymphocytes Relative: 56 %
Lymphs Abs: 0.4 10*3/uL — ABNORMAL LOW (ref 0.7–4.0)
MCH: 30.3 pg (ref 26.0–34.0)
MCHC: 35.1 g/dL (ref 30.0–36.0)
MCV: 86.5 fL (ref 80.0–100.0)
Monocytes Absolute: 0 10*3/uL — ABNORMAL LOW (ref 0.1–1.0)
Monocytes Relative: 1 %
Neutro Abs: 0.3 10*3/uL — CL (ref 1.7–17.7)
Neutrophils Relative %: 39 %
Platelet Count: 31 10*3/uL — ABNORMAL LOW (ref 150–400)
RBC: 3.79 MIL/uL — ABNORMAL LOW (ref 3.87–5.11)
RDW: 11.8 % (ref 11.5–15.5)
WBC Count: 0.7 10*3/uL — CL (ref 4.0–10.5)
nRBC: 0 % (ref 0.0–0.2)

## 2019-10-13 MED ORDER — CIPROFLOXACIN HCL 500 MG PO TABS: 500.0000 mg | ORAL_TABLET | Freq: Two times a day (BID) | ORAL | 0 refills | Status: DC

## 2019-10-13 MED ORDER — SODIUM CHLORIDE 0.9 % IV SOLN
INTRAVENOUS | Status: DC
  Administered 2019-10-13: 17:00:00 via INTRAVENOUS
  Filled 2019-10-13 (×2): qty 250

## 2019-10-13 NOTE — Telephone Encounter (Signed)
Documentation error last telephone note. Critical labs from 10/13/2019 at 1459 reported to NP Lattie Haw, not RN Manuela Schwartz.

## 2019-10-13 NOTE — Telephone Encounter (Addendum)
Per Ned Card NP IV fluids can be run over 1.5 hours.

## 2019-10-13 NOTE — Patient Instructions (Signed)
Dehydration, Adult  Dehydration is when there is not enough fluid or water in your body. This happens when you lose more fluids than you take in. Dehydration can range from mild to very bad. It should be treated right away to keep it from getting very bad. Symptoms of mild dehydration may include:  Thirst.  Dry lips.  Slightly dry mouth.  Dry, warm skin.  Dizziness. Symptoms of moderate dehydration may include:  Very dry mouth.  Muscle cramps.  Dark pee (urine). Pee may be the color of tea.  Your body making less pee.  Your eyes making fewer tears.  Heartbeat that is uneven or faster than normal (palpitations).  Headache.  Light-headedness, especially when you stand up from sitting.  Fainting (syncope). Symptoms of very bad dehydration may include:  Changes in skin, such as: ? Cold and clammy skin. ? Blotchy (mottled) or pale skin. ? Skin that does not quickly return to normal after being lightly pinched and let go (poor skin turgor).  Changes in body fluids, such as: ? Feeling very thirsty. ? Your eyes making fewer tears. ? Not sweating when body temperature is high, such as in hot weather. ? Your body making very little pee.  Changes in vital signs, such as: ? Weak pulse. ? Pulse that is more than 100 beats a minute when you are sitting still. ? Fast breathing. ? Low blood pressure.  Other changes, such as: ? Sunken eyes. ? Cold hands and feet. ? Confusion. ? Lack of energy (lethargy). ? Trouble waking up from sleep. ? Short-term weight loss. ? Unconsciousness. Follow these instructions at home:   If told by your doctor, drink an ORS: ? Make an ORS by using instructions on the package. ? Start by drinking small amounts, about  cup (120 mL) every 5-10 minutes. ? Slowly drink more until you have had the amount that your doctor said to have.  Drink enough clear fluid to keep your pee clear or pale yellow. If you were told to drink an ORS, finish the  ORS first, then start slowly drinking clear fluids. Drink fluids such as: ? Water. Do not drink only water by itself. Doing that can make the salt (sodium) level in your body get too low (hyponatremia). ? Ice chips. ? Fruit juice that you have added water to (diluted). ? Low-calorie sports drinks.  Avoid: ? Alcohol. ? Drinks that have a lot of sugar. These include high-calorie sports drinks, fruit juice that does not have water added, and soda. ? Caffeine. ? Foods that are greasy or have a lot of fat or sugar.  Take over-the-counter and prescription medicines only as told by your doctor.  Do not take salt tablets. Doing that can make the salt level in your body get too high (hypernatremia).  Eat foods that have minerals (electrolytes). Examples include bananas, oranges, potatoes, tomatoes, and spinach.  Keep all follow-up visits as told by your doctor. This is important. Contact a doctor if:  You have belly (abdominal) pain that: ? Gets worse. ? Stays in one area (localizes).  You have a rash.  You have a stiff neck.  You get angry or annoyed more easily than normal (irritability).  You are more sleepy than normal.  You have a harder time waking up than normal.  You feel: ? Weak. ? Dizzy. ? Very thirsty.  You have peed (urinated) only a small amount of very dark pee during 6-8 hours. Get help right away if:  You have   symptoms of very bad dehydration.  You cannot drink fluids without throwing up (vomiting).  Your symptoms get worse with treatment.  You have a fever.  You have a very bad headache.  You are throwing up or having watery poop (diarrhea) and it: ? Gets worse. ? Does not go away.  You have blood or something green (bile) in your throw-up.  You have blood in your poop (stool). This may cause poop to look black and tarry.  You have not peed in 6-8 hours.  You pass out (faint).  Your heart rate when you are sitting still is more than 100 beats a  minute.  You have trouble breathing. This information is not intended to replace advice given to you by your health care provider. Make sure you discuss any questions you have with your health care provider. Document Released: 10/04/2009 Document Revised: 11/20/2017 Document Reviewed: 02/01/2016 Elsevier Patient Education  2020 [employer]  

## 2019-10-13 NOTE — Telephone Encounter (Signed)
TC to Pt to follow up on TC with Merceda Elks RN Pt. Confirmed she did go to the ER last evening, But stated that she still feels the same, she states that she still has a trace of blood when she blows her nose, and states she still has some swelling of the mouth and lips she stated she was getting ready to come for her visit. Told her we will see her when she gets here.

## 2019-10-13 NOTE — Telephone Encounter (Signed)
Critical lab results received 1459: WBC 0.7 ANC 0.2 Desk RN Manuela Schwartz made aware.

## 2019-10-13 NOTE — Telephone Encounter (Signed)
Scheduled appt per 10/22 sch message - unable to reach pt . Left message with appt date and time

## 2019-10-13 NOTE — Progress Notes (Signed)
[facility] OFFICE PROGRESS NOTE   Diagnosis: Anal cancer  INTERVAL HISTORY:   Joanne Klein returns as scheduled.  She completed cycle one 5-FU/mitomycin-C beginning 10/03/2019.  Throat is sore.  Mouth feels swollen.  She is tolerating some liquids.  She notes a trace amount of blood from the right nostril with nose blowing.  She continues to have rectal bleeding.  No fever or chills.  Stable dyspnea on exertion. No significant cough.  She has diarrhea, takes Imodium, develops constipation.  Overall she feels better today than earlier in the week.  She was seen in the emergency department yesterday for evaluation of leg edema and shortness of breath.  ER note indicates preliminary venous Doppler on left leg negative.  Final report not in computer yet.  Chest CT negative for PE or other acute findings.  Objective:  Vital signs in last 24 hours:  Blood pressure 108/73, pulse 72, temperature 98 F (36.7 C), temperature source Temporal, resp. rate 16, weight 233 lb 1.6 oz (105.7 kg), SpO2 98 %.    HEENT: No obvious ulcers.  No thrush. Resp: Lungs clear bilaterally. Cardio: Regular rate and rhythm. GI: No hepatomegaly.  Ulcerated mass left anal margin.  Perineum erythematous. Vascular: No leg edema.  Left lower leg appears slightly larger than the right lower leg. Neuro: Alert and oriented. Skin: Few ecchymoses on the forearms.   Lab Results:  Lab Results  Component Value Date   WBC 0.7 (LL) 10/13/2019   HGB 11.5 (L) 10/13/2019   HCT 32.8 (L) 10/13/2019   MCV 86.5 10/13/2019   PLT 31 (L) 10/13/2019   NEUTROABS 0.3 (LL) 10/13/2019    Imaging:  Dg Chest 2 View  Result Date: 10/12/2019 CLINICAL DATA:  Shortness of breath. Leg pain and swelling. Currently undergoing treatment for anal cancer. EXAM: CHEST - 2 VIEW COMPARISON:  05/26/2014 FINDINGS: The heart size and mediastinal contours are within normal limits. Both lungs are clear. The visualized skeletal  structures are unremarkable. IMPRESSION: No active cardiopulmonary disease. Electronically Signed   By: Lorriane Shire M.D.   On: 10/12/2019 19:10   Ct Angio Chest Pe W And/or Wo Contrast  Result Date: 10/12/2019 CLINICAL DATA:  Pt c/o leg pain, swelling, and SOB x 3 days ago. Patient has pain when she's standing on legs. Pt has rectal cancer and is currently receiving treatment for it. ^138mL OMNIPAQUE IOHEXOL 350 MG/ML SOLNShortness of breath shortness of breath h/o cancer with active treatment EXAM: CT ANGIOGRAPHY CHEST WITH CONTRAST TECHNIQUE: Multidetector CT imaging of the chest was performed using the standard protocol during bolus administration of intravenous contrast. Multiplanar CT image reconstructions and MIPs were obtained to evaluate the vascular anatomy. CONTRAST:  125mL OMNIPAQUE IOHEXOL 350 MG/ML SOLN COMPARISON:  09/14/2019 FINDINGS: Cardiovascular: No filling defects within the pulmonary arteries to suggest acute pulmonary embolism. No acute findings of the aorta or great vessels. No pericardial fluid. Mediastinum/Nodes: No axillary supraclavicular adenopathy. No mediastinal hilar adenopathy no pericardial effusion. Esophagus normal Lungs/Pleura: No pulmonary infarction. Pneumonia. Centrilobular emphysema the upper lobes. No bronchiectasis. No suspicious nodules. Limited view of the liver, kidneys, pancreas are unremarkable. Normal adrenal glands. Upper Abdomen: Limited view of the liver, kidneys, pancreas are unremarkable. Normal adrenal glands. Musculoskeletal: No aggressive osseous lesion. Review of the MIP images confirms the above findings. IMPRESSION: 1. No evidence acute pulmonary embolism. 2. No acute pulmonary parenchymal findings. Aortic Atherosclerosis (ICD10-I70.0) and Emphysema (ICD10-J43.9). Electronically Signed   By: Suzy Bouchard M.D.   On: 10/12/2019  20:59   Vas [place] Lower Extremity Venous (dvt) (only Mc & Wl 7a-7p)  Result Date: 10/13/2019  Lower Venous Study  Indications: Swelling, and Edema.  Comparison Study: no prior Performing Technologist: Abram Sander RVS  Examination Guidelines: A complete evaluation includes B-mode imaging, spectral Doppler, color Doppler, and power Doppler as needed of all accessible portions of each vessel. Bilateral testing is considered an integral part of a complete examination. Limited examinations for reoccurring indications may be performed as noted.  +---------+---------------+---------+-----------+----------+--------------+ RIGHT    CompressibilityPhasicitySpontaneityPropertiesThrombus Aging +---------+---------------+---------+-----------+----------+--------------+ CFV      Full           Yes      Yes                                 +---------+---------------+---------+-----------+----------+--------------+ SFJ      Full                                                        +---------+---------------+---------+-----------+----------+--------------+ FV Prox  Full                                                        +---------+---------------+---------+-----------+----------+--------------+ FV Mid   Full                                                        +---------+---------------+---------+-----------+----------+--------------+ FV DistalFull                                                        +---------+---------------+---------+-----------+----------+--------------+ PFV      Full                                                        +---------+---------------+---------+-----------+----------+--------------+ POP      Full           Yes      Yes                                 +---------+---------------+---------+-----------+----------+--------------+ PTV      Full                                                        +---------+---------------+---------+-----------+----------+--------------+ PERO  Not  visualized +---------+---------------+---------+-----------+----------+--------------+   +---------+---------------+---------+-----------+----------+--------------+ LEFT     CompressibilityPhasicitySpontaneityPropertiesThrombus Aging +---------+---------------+---------+-----------+----------+--------------+ CFV      Full           Yes      Yes                                 +---------+---------------+---------+-----------+----------+--------------+ SFJ      Full                                                        +---------+---------------+---------+-----------+----------+--------------+ FV Prox  Full                                                        +---------+---------------+---------+-----------+----------+--------------+ FV Mid   Full                                                        +---------+---------------+---------+-----------+----------+--------------+ FV DistalFull                                                        +---------+---------------+---------+-----------+----------+--------------+ PFV      Full                                                        +---------+---------------+---------+-----------+----------+--------------+ POP      Full           Yes      Yes                                 +---------+---------------+---------+-----------+----------+--------------+ PTV      Full                                                        +---------+---------------+---------+-----------+----------+--------------+ PERO                                                  Not visualized +---------+---------------+---------+-----------+----------+--------------+     *See table(s) above for measurements and observations. Electronically signed by Servando Snare MD on 10/13/2019 at 2:16:15 PM.    Final     Medications: I have reviewed the patient's current medications.  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  Biopsy 08/30/2019 confirmed well-differentiated squamous cell carcinoma with positive P 16 and p63 stains ? CTs 09/14/2019-abnormal soft tissue fullness at the lower anus/perianal soft tissues, asymmetric left inguinal lymphadenopathy, borderline enlarged left external iliac node, mild stranding and cutaneous thickening of the left gluteal fold, 2 to 3 mm pulmonary nodules, 1.6 cm right hepatic lesion-likely a hemangioma ? Began concurrent chemoRT with 5FU/mitomycin on 10/03/2019  2. COPD 3. Fibromyalgia 4. Chronic back pain 5. Hypothyroid 6. Depression 7. 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 Pain secondary to #1 9. Orthostatic hypotension, fatigue, dyspnea on exertion, early mucositis requiring supportive care on day 9, secondary to chemotherapy  10. Thrombocytopenia PLT 58K and neutropenia ANC 1.4 on day 9, secondary to chemotherapy   Disposition: Joanne Klein is a 73 year old woman with anal cancer.  She is receiving radiation.  She is day 11 following cycle one 5-FU/mitomycin-C.  She has significant neutropenia and thrombocytopenia related to the chemotherapy.  She is afebrile.  She will begin Cipro prophylaxis.  Platelet count today is 31,000.  She will return for a follow-up CBC tomorrow.  She understands to seek evaluation in the emergency department if she develops fever, chills, other signs of infection, bleeding overnight.  She has throat pain, able to tolerate some liquids.  This is likely mucositis from the chemotherapy.  She will receive a liter of IV fluids in the office today.  We will also schedule her for a liter of IV fluids tomorrow.  She will return for lab and IV fluids 10/14/2019.  Possible IV fluids 10/15/2019.  Lab and follow-up on 10/17/2019.  She will contact the office in the interim as outlined above or with any other problems.  25 minutes were spent face-to-face at today's visit with the majority of that time involved in counseling/coordination of care.     Ned Card ANP/GNP-BC   10/13/2019  3:31 PM

## 2019-10-14 ENCOUNTER — Other Ambulatory Visit: Payer: Self-pay | Admitting: *Deleted

## 2019-10-14 ENCOUNTER — Telehealth: Payer: Self-pay | Admitting: Nurse Practitioner

## 2019-10-14 ENCOUNTER — Inpatient Hospital Stay: Payer: Medicare Other

## 2019-10-14 ENCOUNTER — Other Ambulatory Visit: Payer: Self-pay | Admitting: Nurse Practitioner

## 2019-10-14 ENCOUNTER — Other Ambulatory Visit: Payer: Self-pay

## 2019-10-14 ENCOUNTER — Telehealth: Payer: Self-pay | Admitting: *Deleted

## 2019-10-14 ENCOUNTER — Ambulatory Visit: Payer: Medicare Other

## 2019-10-14 DIAGNOSIS — E039 Hypothyroidism, unspecified: Secondary | ICD-10-CM | POA: Diagnosis not present

## 2019-10-14 DIAGNOSIS — D701 Agranulocytosis secondary to cancer chemotherapy: Secondary | ICD-10-CM | POA: Diagnosis not present

## 2019-10-14 DIAGNOSIS — D696 Thrombocytopenia, unspecified: Secondary | ICD-10-CM

## 2019-10-14 DIAGNOSIS — K219 Gastro-esophageal reflux disease without esophagitis: Secondary | ICD-10-CM | POA: Diagnosis not present

## 2019-10-14 DIAGNOSIS — C210 Malignant neoplasm of anus, unspecified: Secondary | ICD-10-CM

## 2019-10-14 DIAGNOSIS — M549 Dorsalgia, unspecified: Secondary | ICD-10-CM | POA: Diagnosis not present

## 2019-10-14 DIAGNOSIS — R0789 Other chest pain: Secondary | ICD-10-CM | POA: Diagnosis not present

## 2019-10-14 DIAGNOSIS — D6959 Other secondary thrombocytopenia: Secondary | ICD-10-CM | POA: Diagnosis not present

## 2019-10-14 DIAGNOSIS — E43 Unspecified severe protein-calorie malnutrition: Secondary | ICD-10-CM | POA: Diagnosis not present

## 2019-10-14 DIAGNOSIS — C218 Malignant neoplasm of overlapping sites of rectum, anus and anal canal: Secondary | ICD-10-CM | POA: Diagnosis not present

## 2019-10-14 DIAGNOSIS — I10 Essential (primary) hypertension: Secondary | ICD-10-CM | POA: Diagnosis not present

## 2019-10-14 DIAGNOSIS — E785 Hyperlipidemia, unspecified: Secondary | ICD-10-CM | POA: Diagnosis not present

## 2019-10-14 DIAGNOSIS — Z66 Do not resuscitate: Secondary | ICD-10-CM | POA: Diagnosis not present

## 2019-10-14 DIAGNOSIS — M545 Low back pain: Secondary | ICD-10-CM | POA: Diagnosis not present

## 2019-10-14 DIAGNOSIS — G8929 Other chronic pain: Secondary | ICD-10-CM | POA: Diagnosis not present

## 2019-10-14 DIAGNOSIS — D61810 Antineoplastic chemotherapy induced pancytopenia: Secondary | ICD-10-CM | POA: Diagnosis not present

## 2019-10-14 DIAGNOSIS — G4733 Obstructive sleep apnea (adult) (pediatric): Secondary | ICD-10-CM | POA: Diagnosis not present

## 2019-10-14 DIAGNOSIS — T451X5A Adverse effect of antineoplastic and immunosuppressive drugs, initial encounter: Secondary | ICD-10-CM | POA: Diagnosis not present

## 2019-10-14 DIAGNOSIS — I951 Orthostatic hypotension: Secondary | ICD-10-CM | POA: Diagnosis not present

## 2019-10-14 DIAGNOSIS — R040 Epistaxis: Secondary | ICD-10-CM | POA: Diagnosis not present

## 2019-10-14 DIAGNOSIS — M797 Fibromyalgia: Secondary | ICD-10-CM | POA: Diagnosis not present

## 2019-10-14 DIAGNOSIS — E876 Hypokalemia: Secondary | ICD-10-CM | POA: Diagnosis not present

## 2019-10-14 LAB — CBC WITH DIFFERENTIAL (CANCER CENTER ONLY)
Abs Immature Granulocytes: 0 10*3/uL (ref 0.00–0.07)
Band Neutrophils: 1 %
Basophils Absolute: 0 10*3/uL (ref 0.0–0.1)
Basophils Relative: 0 %
Eosinophils Absolute: 0 10*3/uL (ref 0.0–0.5)
Eosinophils Relative: 5 %
HCT: 31 % — ABNORMAL LOW (ref 36.0–46.0)
Hemoglobin: 11.3 g/dL — ABNORMAL LOW (ref 12.0–15.0)
Lymphocytes Relative: 88 %
Lymphs Abs: 0.4 10*3/uL — ABNORMAL LOW (ref 0.7–4.0)
MCH: 31.1 pg (ref 26.0–34.0)
MCHC: 36.5 g/dL — ABNORMAL HIGH (ref 30.0–36.0)
MCV: 85.4 fL (ref 80.0–100.0)
Monocytes Absolute: 0 10*3/uL — ABNORMAL LOW (ref 0.1–1.0)
Monocytes Relative: 0 %
Neutro Abs: 0 10*3/uL — CL (ref 1.7–17.7)
Neutrophils Relative %: 6 %
Platelet Count: 18 10*3/uL — ABNORMAL LOW (ref 150–400)
RBC: 3.63 MIL/uL — ABNORMAL LOW (ref 3.87–5.11)
RDW: 11.4 % — ABNORMAL LOW (ref 11.5–15.5)
WBC Count: 0.5 10*3/uL — CL (ref 4.0–10.5)
nRBC: 0 % (ref 0.0–0.2)

## 2019-10-14 LAB — TYPE AND SCREEN
ABO/RH(D): A NEG
Antibody Screen: NEGATIVE

## 2019-10-14 LAB — SAMPLE TO BLOOD BANK

## 2019-10-14 MED ORDER — ACETAMINOPHEN 325 MG PO TABS
ORAL_TABLET | ORAL | Status: AC
  Filled 2019-10-14: qty 2

## 2019-10-14 MED ORDER — SODIUM CHLORIDE 0.9 % IV SOLN
INTRAVENOUS | Status: DC
  Administered 2019-10-14: 14:00:00 via INTRAVENOUS
  Filled 2019-10-14 (×2): qty 250

## 2019-10-14 MED ORDER — ACETAMINOPHEN 325 MG PO TABS
650.0000 mg | ORAL_TABLET | Freq: Once | ORAL | Status: AC
  Administered 2019-10-14: 650 mg via ORAL

## 2019-10-14 MED ORDER — SODIUM CHLORIDE 0.9 % IV SOLN
Freq: Once | INTRAVENOUS | Status: DC
  Filled 2019-10-14: qty 250

## 2019-10-14 MED ORDER — SODIUM CHLORIDE 0.9% IV SOLUTION
250.0000 mL | Freq: Once | INTRAVENOUS | Status: AC
  Administered 2019-10-14: 250 mL via INTRAVENOUS
  Filled 2019-10-14: qty 250

## 2019-10-14 NOTE — Progress Notes (Signed)
Called blood bank and informed to prepare 1 unit of platelets today.

## 2019-10-14 NOTE — Telephone Encounter (Signed)
Scheduled appt per 10/22 los. ° °Left a VM of the appt date and time. °

## 2019-10-14 NOTE — Progress Notes (Signed)
Patient presented today for IV fluids and first time Platelet transfusion. 15 minutes into the platelet she started complaining of headache in both her temples. She rated the ache to be 3/10. Platelet stopped, verbal order with read back received from Ned Card, NP to administer Tylenol 650 MG po and restart platelet in 30 minutes at slow rate and increase as tolerated. All vitals obtained charted in flowsheet. Patient medicated and  platelet restarted as ordered; she tolerated the rest of the transfusion with no further complain of concern.

## 2019-10-14 NOTE — Telephone Encounter (Signed)
Rescheduled appt per 10/23 sch message  - unable to reach pt . Left message with appt date and time

## 2019-10-14 NOTE — Telephone Encounter (Signed)
CRITICAL VALUE STICKER  CRITICAL VALUE: WBC 0.5 & ANC 0.1    RECEIVER (on-site recipient of call): Dutch Gray   DATE & TIME NOTIFIED: 10/14/2019 at 1317   MESSENGER (representative from lab): Verdis Frederickson   MD NOTIFIED: Ned Card, NP  TIME OF NOTIFICATION: 1318  RESPONSE: Already on Cipro. Call for fever or s/s of infection. Recheck on Monday. Will give platelets today since platelet count had dropped by 13,000 in 24 hours.

## 2019-10-14 NOTE — Patient Instructions (Signed)
Platelet Transfusion A platelet transfusion is a procedure in which you receive donated platelets through an IV. Platelets are tiny pieces of blood cells. When you get an injury, platelets clump together in the area to form a blood clot. This helps stop bleeding and is the beginning of the healing process. If you have too few platelets, your blood may have trouble clotting. This may cause you to bleed and bruise very easily. You may need a platelet transfusion if you have a condition that causes a low number of platelets (thrombocytopenia). A platelet transfusion may be used to stop or prevent excessive bleeding. Tell a health care provider about:  Any reactions you have had during previous transfusions.  Any allergies you have.  All medicines you are taking, including vitamins, herbs, eye drops, creams, and over-the-counter medicines.  Any blood disorders you have.  Any surgeries you have had.  Any medical conditions you have.  Whether you are pregnant or may be pregnant. What are the risks? Generally, this is a safe procedure. However, problems may occur, including:  Fever.  Infection.  Allergic reaction to the donor platelets.  Your body's disease-fighting system (immune system) attacking the donor platelets (hemolytic reaction). This is rare.  A rare reaction that causes lung damage (transfusion-related acute lung injury). What happens before the procedure? Medicines  Ask your health care provider about: ? Changing or stopping your regular medicines. This is especially important if you are taking diabetes medicines or blood thinners. ? Taking medicines such as aspirin and ibuprofen. These medicines can thin your blood. Do not take these medicines unless your health care provider tells you to take them. ? Taking over-the-counter medicines, vitamins, herbs, and supplements. General instructions  You will have a blood test to determine your blood type. Your blood type  determines what kind of platelets you will be given.  Follow instructions from your health care provider about eating or drinking restrictions.  If you have had an allergic reaction to a transfusion in the past, you may be given medicine to help prevent a reaction.  Your temperature, blood pressure, pulse, and breathing will be monitored. What happens during the procedure?   An IV will be inserted into one of your veins.  For your safety, two health care providers will verify your identity along with the donor platelets about to be infused.  A bag of donor platelets will be connected to your IV. The platelets will flow into your bloodstream. This usually takes 30-60 minutes.  Your temperature, blood pressure, pulse, and breathing will be monitored during the transfusion. This helps detect early signs of any reaction.  You will also be monitored for other symptoms that may indicate a reaction, including chills, hives, or itching.  If you have signs of a reaction at any time, your transfusion will be stopped, and you may be given medicine to help manage the reaction.  When your transfusion is complete, your IV will be removed.  Pressure may be applied to the IV site for a few minutes to stop any bleeding.  The IV site will be covered with a bandage (dressing). The procedure may vary among health care providers and hospitals. What happens after the procedure?  Your blood pressure, temperature, pulse, and breathing will be monitored until you leave the hospital or clinic.  You may have some bruising and soreness at your IV site. Follow these instructions at home: Medicines  Take over-the-counter and prescription medicines only as told by your health care provider.  Talk with your health care provider before you take any medicines that contain aspirin or NSAIDs. These medicines increase your risk for dangerous bleeding. General instructions  Change or remove your dressing as told  by your health care provider.  Return to your normal activities as told by your health care provider. Ask your health care provider what activities are safe for you.  Do not take baths, swim, or use a hot tub until your health care provider approves. Ask your health care provider if you may take showers.  Check your IV site every day for signs of infection. Check for: ? Redness, swelling, or pain. ? Fluid or blood. If fluid or blood drains from your IV site, use your hands to press down firmly on a bandage covering the area for a minute or two. Doing this should stop the bleeding. ? Warmth. ? Pus or a bad smell.  Keep all follow-up visits as told by your health care provider. This is important. Contact a health care provider if you have:  A headache that does not go away with medicine.  Hives, rash, or itchy skin.  Nausea or vomiting.  Unusual tiredness or weakness.  Signs of infection at your IV site. Get help right away if:  You have a fever or chills.  You urinate less often than usual.  Your urine is darker colored than normal.  You have any of the following: ? Trouble breathing. ? Pain in your back, abdomen, or chest. ? Cool, clammy skin. ? A fast heartbeat. Summary  Platelets are tiny pieces of blood cells that clump together to form a blood clot when you have an injury. If you have too few platelets, your blood may have trouble clotting.  A platelet transfusion is a procedure in which you receive donated platelets through an IV.  A platelet transfusion may be used to stop or prevent excessive bleeding.  After the procedure, check your IV site every day for signs of infection, including redness, swelling, pain, or warmth. This information is not intended to replace advice given to you by your health care provider. Make sure you discuss any questions you have with your health care provider. Document Released: 10/05/2007 Document Revised: 01/13/2018 Document  Reviewed: 01/13/2018 Elsevier Patient Education  [place].   Rehydration, Adult Rehydration is the replacement of body fluids and salts and minerals (electrolytes) that are lost during dehydration. Dehydration is when there is not enough fluid or water in the body. This happens when you lose more fluids than you take in. Common causes of dehydration include:  Vomiting.  Diarrhea.  Excessive sweating, such as from heat exposure or exercise.  Taking medicines that cause the body to lose excess fluid (diuretics).  Impaired kidney function.  Not drinking enough fluid.  Certain illnesses or infections.  Certain poorly controlled long-term (chronic) illnesses, such as diabetes, heart disease, and kidney disease.  Symptoms of mild dehydration may include thirst, dry lips and mouth, dry skin, and dizziness. Symptoms of severe dehydration may include increased heart rate, confusion, fainting, and not urinating. You can rehydrate by drinking certain fluids or getting fluids through an IV tube, as told by your health care provider. What are the risks? Generally, rehydration is safe. However, one problem that can happen is taking in too much fluid (overhydration). This is rare. If overhydration happens, it can cause an electrolyte imbalance, kidney failure, or a decrease in salt (sodium) levels in the body. How to rehydrate Follow instructions from your  health care provider for rehydration. The kind of fluid you should drink and the amount you should drink depend on your condition.  If directed by your health care provider, drink an oral rehydration solution (ORS). This is a drink designed to treat dehydration that is found in pharmacies and retail stores. ? Make an ORS by following instructions on the package. ? Start by drinking small amounts, about  cup (120 mL) every 5-10 minutes. ? Slowly increase how much you drink until you have taken the amount recommended by your health care  provider.  Drink enough clear fluids to keep your urine clear or pale yellow. If you were instructed to drink an ORS, finish the ORS first, then start slowly drinking other clear fluids. Drink fluids such as: ? Water. Do not drink only water. Doing that can lead to having too little sodium in your body (hyponatremia). ? Ice chips. ? Fruit juice that you have added water to (diluted juice). ? Low-calorie sports drinks.  If you are severely dehydrated, your health care provider may recommend that you receive fluids through an IV tube in the hospital.  Do not take sodium tablets. Doing that can lead to the condition of having too much sodium in your body (hypernatremia). Eating while you rehydrate Follow instructions from your health care provider about what to eat while you rehydrate. Your health care provider may recommend that you slowly begin eating regular foods in small amounts.  Eat foods that contain a healthy balance of electrolytes, such as bananas, oranges, potatoes, tomatoes, and spinach.  Avoid foods that are greasy or contain a lot of fat or sugar.  In some cases, you may get nutrition through a feeding tube that is passed through your nose and into your stomach (nasogastric tube, or NG tube). This may be done if you have uncontrolled vomiting or diarrhea. Beverages to avoid Certain beverages may make dehydration worse. While you rehydrate, avoid:  Alcohol.  Caffeine.  Drinks that contain a lot of sugar. These include: ? High-calorie sports drinks. ? Fruit juice that is not diluted. ? Soda.  Check nutrition labels to see how much sugar or caffeine a beverage contains. Signs of dehydration recovery You may be recovering from dehydration if:  You are urinating more often than before you started rehydrating.  Your urine is clear or pale yellow.  Your energy level improves.  You vomit less frequently.  You have diarrhea less frequently.  Your appetite improves or  returns to normal.  You feel less dizzy or less light-headed.  Your skin tone and color start to look more normal. Contact a health care provider if:  You continue to have symptoms of mild dehydration, such as: ? Thirst. ? Dry lips. ? Slightly dry mouth. ? Dry, warm skin. ? Dizziness.  You continue to vomit or have diarrhea. Get help right away if:  You have symptoms of dehydration that get worse.  You feel: ? Confused. ? Weak. ? Like you are going to faint.  You have not urinated in 6-8 hours.  You have very dark urine.  You have trouble breathing.  Your heart rate while sitting still is over 100 beats a minute.  You cannot drink fluids without vomiting.  You have vomiting or diarrhea that: ? Gets worse. ? Does not go away.  You have a fever. This information is not intended to replace advice given to you by your health care provider. Make sure you discuss any questions you have with your  health care provider. Document Released: 03/01/2012 Document Revised: 11/20/2017 Document Reviewed: 02/01/2016 Elsevier Patient Education  2020 [place] (COVID-19) Are you at risk?  Are you at risk for the Coronavirus (COVID-19)?  To be considered HIGH RISK for Coronavirus (COVID-19), you have to meet the following criteria:  . Traveled to [place], [place], [place], [place] or [place]; or in the [place] to [place], [place], [place], or [place]; and have fever, cough, and shortness of breath within the last 2 weeks of travel OR . Been in close contact with a person diagnosed with COVID-19 within the last 2 weeks and have fever, cough, and shortness of breath . IF YOU DO NOT MEET THESE CRITERIA, YOU ARE CONSIDERED LOW RISK FOR COVID-19.  What to do if you are HIGH RISK for COVID-19?  Marland Kitchen If you are having a medical emergency, call 911. . Seek medical care right away. Before you go to a doctor's office, urgent care or emergency department, call ahead  and tell them about your recent travel, contact with someone diagnosed with COVID-19, and your symptoms. You should receive instructions from your physician's office regarding next steps of care.  . When you arrive at healthcare provider, tell the healthcare staff immediately you have returned from visiting [place], [place], [place], [place] or [place]; or traveled in the [place] to [place], [place], [place], or [place]; in the last two weeks or you have been in close contact with a person diagnosed with COVID-19 in the last 2 weeks.   . Tell the health care staff about your symptoms: fever, cough and shortness of breath. . After you have been seen by a medical provider, you will be either: o Tested for (COVID-19) and discharged home on quarantine except to seek medical care if symptoms worsen, and asked to  - Stay home and avoid contact with others until you get your results (4-5 days)  - Avoid travel on public transportation if possible (such as bus, train, or airplane) or o Sent to the Emergency Department by EMS for evaluation, COVID-19 testing, and possible admission depending on your condition and test results.  What to do if you are LOW RISK for COVID-19?  Reduce your risk of any infection by using the same precautions used for avoiding the common cold or flu:  Marland Kitchen Wash your hands often with soap and warm water for at least 20 seconds.  If soap and water are not readily available, use an alcohol-based hand sanitizer with at least 60% alcohol.  . If coughing or sneezing, cover your mouth and nose by coughing or sneezing into the elbow areas of your shirt or coat, into a tissue or into your sleeve (not your hands). . Avoid shaking hands with others and consider head nods or verbal greetings only. . Avoid touching your eyes, nose, or mouth with unwashed hands.  . Avoid close contact with people who are sick. . Avoid places or events with large numbers of people in one location, like  concerts or sporting events. . Carefully consider travel plans you have or are making. . If you are planning any travel outside or inside the [place], visit the CDC's Travelers' Health webpage for the latest health notices. . If you have some symptoms but not all symptoms, continue to monitor at home and seek medical attention if your symptoms worsen. . If you are having a medical emergency, call 911.   ADDITIONAL HEALTHCARE OPTIONS FOR PATIENTS  [facility] / e-Visit: [email or web address]         [facility]: [facility]: [id number]                   [facility]: [phone]

## 2019-10-15 ENCOUNTER — Inpatient Hospital Stay: Payer: Medicare Other

## 2019-10-15 DIAGNOSIS — C210 Malignant neoplasm of anus, unspecified: Secondary | ICD-10-CM

## 2019-10-15 LAB — ABO/RH: ABO/RH(D): A NEG

## 2019-10-15 MED ORDER — SODIUM CHLORIDE 0.9 % IV SOLN
Freq: Once | INTRAVENOUS | Status: AC
  Administered 2019-10-15: 09:00:00 via INTRAVENOUS
  Filled 2019-10-15: qty 250

## 2019-10-15 NOTE — Patient Instructions (Signed)

## 2019-10-16 LAB — BPAM PLATELET PHERESIS
Blood Product Expiration Date: 202010232359
ISSUE DATE / TIME: 202010231445
Unit Type and Rh: 6200

## 2019-10-16 LAB — PREPARE PLATELET PHERESIS: Unit division: 0

## 2019-10-17 ENCOUNTER — Other Ambulatory Visit: Payer: Self-pay | Admitting: Nurse Practitioner

## 2019-10-17 ENCOUNTER — Encounter: Payer: Self-pay | Admitting: *Deleted

## 2019-10-17 ENCOUNTER — Inpatient Hospital Stay (HOSPITAL_COMMUNITY)
Admission: AD | Admit: 2019-10-17 | Discharge: 2019-10-27 | DRG: 808 | Disposition: A | Discharge status: Home / Self Care | Payer: Medicare Other | Attending: Internal Medicine | Admitting: Internal Medicine

## 2019-10-17 ENCOUNTER — Inpatient Hospital Stay: Payer: Medicare Other

## 2019-10-17 ENCOUNTER — Inpatient Hospital Stay (HOSPITAL_BASED_OUTPATIENT_CLINIC_OR_DEPARTMENT_OTHER): Payer: Medicare Other | Admitting: Medical

## 2019-10-17 ENCOUNTER — Encounter: Payer: Self-pay | Admitting: Nurse Practitioner

## 2019-10-17 ENCOUNTER — Inpatient Hospital Stay: Payer: Medicare Other | Admitting: Nurse Practitioner

## 2019-10-17 ENCOUNTER — Inpatient Hospital Stay (HOSPITAL_BASED_OUTPATIENT_CLINIC_OR_DEPARTMENT_OTHER): Payer: Medicare Other | Admitting: Nurse Practitioner

## 2019-10-17 ENCOUNTER — Other Ambulatory Visit: Payer: Self-pay

## 2019-10-17 ENCOUNTER — Ambulatory Visit: Payer: Medicare Other

## 2019-10-17 VITALS — BP 133/69 | HR 80 | Temp 98.5°F | Resp 20 | Ht 68.0 in | Wt 231.9 lb

## 2019-10-17 DIAGNOSIS — M797 Fibromyalgia: Secondary | ICD-10-CM | POA: Diagnosis present

## 2019-10-17 DIAGNOSIS — Z923 Personal history of irradiation: Secondary | ICD-10-CM

## 2019-10-17 DIAGNOSIS — J449 Chronic obstructive pulmonary disease, unspecified: Secondary | ICD-10-CM | POA: Diagnosis not present

## 2019-10-17 DIAGNOSIS — R0789 Other chest pain: Secondary | ICD-10-CM | POA: Diagnosis present

## 2019-10-17 DIAGNOSIS — R5081 Fever presenting with conditions classified elsewhere: Secondary | ICD-10-CM | POA: Diagnosis not present

## 2019-10-17 DIAGNOSIS — I10 Essential (primary) hypertension: Secondary | ICD-10-CM | POA: Diagnosis present

## 2019-10-17 DIAGNOSIS — R5383 Other fatigue: Secondary | ICD-10-CM | POA: Diagnosis not present

## 2019-10-17 DIAGNOSIS — E785 Hyperlipidemia, unspecified: Secondary | ICD-10-CM | POA: Diagnosis present

## 2019-10-17 DIAGNOSIS — F3341 Major depressive disorder, recurrent, in partial remission: Secondary | ICD-10-CM

## 2019-10-17 DIAGNOSIS — Z8542 Personal history of malignant neoplasm of other parts of uterus: Secondary | ICD-10-CM

## 2019-10-17 DIAGNOSIS — Z452 Encounter for adjustment and management of vascular access device: Secondary | ICD-10-CM | POA: Diagnosis not present

## 2019-10-17 DIAGNOSIS — M549 Dorsalgia, unspecified: Secondary | ICD-10-CM | POA: Diagnosis present

## 2019-10-17 DIAGNOSIS — E039 Hypothyroidism, unspecified: Secondary | ICD-10-CM | POA: Diagnosis not present

## 2019-10-17 DIAGNOSIS — D709 Neutropenia, unspecified: Secondary | ICD-10-CM | POA: Diagnosis not present

## 2019-10-17 DIAGNOSIS — K1230 Oral mucositis (ulcerative), unspecified: Secondary | ICD-10-CM | POA: Diagnosis present

## 2019-10-17 DIAGNOSIS — T66XXXA Radiation sickness, unspecified, initial encounter: Secondary | ICD-10-CM | POA: Diagnosis present

## 2019-10-17 DIAGNOSIS — C210 Malignant neoplasm of anus, unspecified: Secondary | ICD-10-CM

## 2019-10-17 DIAGNOSIS — D701 Agranulocytosis secondary to cancer chemotherapy: Secondary | ICD-10-CM | POA: Diagnosis not present

## 2019-10-17 DIAGNOSIS — I951 Orthostatic hypotension: Secondary | ICD-10-CM | POA: Diagnosis present

## 2019-10-17 DIAGNOSIS — D61810 Antineoplastic chemotherapy induced pancytopenia: Secondary | ICD-10-CM | POA: Diagnosis not present

## 2019-10-17 DIAGNOSIS — Z833 Family history of diabetes mellitus: Secondary | ICD-10-CM

## 2019-10-17 DIAGNOSIS — F429 Obsessive-compulsive disorder, unspecified: Secondary | ICD-10-CM | POA: Diagnosis present

## 2019-10-17 DIAGNOSIS — D696 Thrombocytopenia, unspecified: Secondary | ICD-10-CM

## 2019-10-17 DIAGNOSIS — M419 Scoliosis, unspecified: Secondary | ICD-10-CM | POA: Diagnosis present

## 2019-10-17 DIAGNOSIS — K219 Gastro-esophageal reflux disease without esophagitis: Secondary | ICD-10-CM | POA: Diagnosis not present

## 2019-10-17 DIAGNOSIS — D649 Anemia, unspecified: Secondary | ICD-10-CM

## 2019-10-17 DIAGNOSIS — Z66 Do not resuscitate: Secondary | ICD-10-CM | POA: Diagnosis not present

## 2019-10-17 DIAGNOSIS — Z20828 Contact with and (suspected) exposure to other viral communicable diseases: Secondary | ICD-10-CM | POA: Diagnosis not present

## 2019-10-17 DIAGNOSIS — K625 Hemorrhage of anus and rectum: Secondary | ICD-10-CM | POA: Diagnosis not present

## 2019-10-17 DIAGNOSIS — T801XXA Vascular complications following infusion, transfusion and therapeutic injection, initial encounter: Secondary | ICD-10-CM

## 2019-10-17 DIAGNOSIS — Z882 Allergy status to sulfonamides status: Secondary | ICD-10-CM

## 2019-10-17 DIAGNOSIS — T451X5A Adverse effect of antineoplastic and immunosuppressive drugs, initial encounter: Secondary | ICD-10-CM | POA: Diagnosis not present

## 2019-10-17 DIAGNOSIS — Z79891 Long term (current) use of opiate analgesic: Secondary | ICD-10-CM

## 2019-10-17 DIAGNOSIS — E7800 Pure hypercholesterolemia, unspecified: Secondary | ICD-10-CM | POA: Diagnosis present

## 2019-10-17 DIAGNOSIS — E43 Unspecified severe protein-calorie malnutrition: Secondary | ICD-10-CM | POA: Diagnosis present

## 2019-10-17 DIAGNOSIS — R197 Diarrhea, unspecified: Secondary | ICD-10-CM | POA: Diagnosis not present

## 2019-10-17 DIAGNOSIS — F329 Major depressive disorder, single episode, unspecified: Secondary | ICD-10-CM | POA: Diagnosis not present

## 2019-10-17 DIAGNOSIS — E876 Hypokalemia: Secondary | ICD-10-CM | POA: Diagnosis present

## 2019-10-17 DIAGNOSIS — E440 Moderate protein-calorie malnutrition: Secondary | ICD-10-CM | POA: Diagnosis not present

## 2019-10-17 DIAGNOSIS — G4733 Obstructive sleep apnea (adult) (pediatric): Secondary | ICD-10-CM | POA: Diagnosis present

## 2019-10-17 DIAGNOSIS — C218 Malignant neoplasm of overlapping sites of rectum, anus and anal canal: Secondary | ICD-10-CM | POA: Diagnosis present

## 2019-10-17 DIAGNOSIS — D6959 Other secondary thrombocytopenia: Secondary | ICD-10-CM | POA: Diagnosis present

## 2019-10-17 DIAGNOSIS — F32A Depression, unspecified: Secondary | ICD-10-CM | POA: Diagnosis present

## 2019-10-17 DIAGNOSIS — Z87891 Personal history of nicotine dependence: Secondary | ICD-10-CM

## 2019-10-17 DIAGNOSIS — M545 Low back pain: Secondary | ICD-10-CM | POA: Diagnosis present

## 2019-10-17 DIAGNOSIS — J439 Emphysema, unspecified: Secondary | ICD-10-CM | POA: Diagnosis present

## 2019-10-17 DIAGNOSIS — Z85828 Personal history of other malignant neoplasm of skin: Secondary | ICD-10-CM

## 2019-10-17 DIAGNOSIS — G8929 Other chronic pain: Secondary | ICD-10-CM | POA: Diagnosis present

## 2019-10-17 DIAGNOSIS — K1379 Other lesions of oral mucosa: Secondary | ICD-10-CM | POA: Diagnosis not present

## 2019-10-17 DIAGNOSIS — Z885 Allergy status to narcotic agent status: Secondary | ICD-10-CM

## 2019-10-17 DIAGNOSIS — Z800 Family history of malignant neoplasm of digestive organs: Secondary | ICD-10-CM

## 2019-10-17 DIAGNOSIS — E669 Obesity, unspecified: Secondary | ICD-10-CM | POA: Diagnosis present

## 2019-10-17 DIAGNOSIS — K529 Noninfective gastroenteritis and colitis, unspecified: Secondary | ICD-10-CM | POA: Diagnosis present

## 2019-10-17 DIAGNOSIS — R040 Epistaxis: Secondary | ICD-10-CM | POA: Diagnosis present

## 2019-10-17 DIAGNOSIS — Z85048 Personal history of other malignant neoplasm of rectum, rectosigmoid junction, and anus: Secondary | ICD-10-CM

## 2019-10-17 DIAGNOSIS — Z79890 Hormone replacement therapy (postmenopausal): Secondary | ICD-10-CM

## 2019-10-17 DIAGNOSIS — Z6835 Body mass index (BMI) 35.0-35.9, adult: Secondary | ICD-10-CM

## 2019-10-17 DIAGNOSIS — Z792 Long term (current) use of antibiotics: Secondary | ICD-10-CM

## 2019-10-17 LAB — CBC WITH DIFFERENTIAL (CANCER CENTER ONLY)
Abs Immature Granulocytes: 0 10*3/uL (ref 0.00–0.07)
Basophils Absolute: 0 10*3/uL (ref 0.0–0.1)
Basophils Relative: 0 %
Eosinophils Absolute: 0 10*3/uL (ref 0.0–0.5)
Eosinophils Relative: 0 %
HCT: 27.1 % — ABNORMAL LOW (ref 36.0–46.0)
Hemoglobin: 9.7 g/dL — ABNORMAL LOW (ref 12.0–15.0)
Immature Granulocytes: 0 %
Lymphocytes Relative: 95 %
Lymphs Abs: 0.2 10*3/uL — ABNORMAL LOW (ref 0.7–4.0)
MCH: 30.6 pg (ref 26.0–34.0)
MCHC: 35.8 g/dL (ref 30.0–36.0)
MCV: 85.5 fL (ref 80.0–100.0)
Monocytes Absolute: 0 10*3/uL — ABNORMAL LOW (ref 0.1–1.0)
Monocytes Relative: 5 %
Neutro Abs: 0 10*3/uL — CL (ref 1.7–7.7)
Neutrophils Relative %: 0 %
Platelet Count: 8 10*3/uL — CL (ref 150–400)
RBC: 3.17 MIL/uL — ABNORMAL LOW (ref 3.87–5.11)
RDW: 11.6 % (ref 11.5–15.5)
WBC Count: 0.2 10*3/uL — CL (ref 4.0–10.5)
nRBC: 0 % (ref 0.0–0.2)

## 2019-10-17 LAB — COMPREHENSIVE METABOLIC PANEL
ALT: 10 U/L (ref 0–44)
AST: 11 U/L — ABNORMAL LOW (ref 15–41)
Albumin: 3 g/dL — ABNORMAL LOW (ref 3.5–5.0)
Alkaline Phosphatase: 53 U/L (ref 38–126)
Anion gap: 8 (ref 5–15)
BUN: 22 mg/dL (ref 8–23)
CO2: 23 mmol/L (ref 22–32)
Calcium: 7.8 mg/dL — ABNORMAL LOW (ref 8.9–10.3)
Chloride: 107 mmol/L (ref 98–111)
Creatinine, Ser: 0.92 mg/dL (ref 0.44–1.00)
GFR calc Af Amer: >60 mL/min (ref >60)
GFR calc non Af Amer: >60 mL/min (ref >60)
Glucose, Bld: 92 mg/dL (ref 70–99)
Potassium: 2.8 mmol/L — ABNORMAL LOW (ref 3.5–5.1)
Sodium: 138 mmol/L (ref 135–145)
Total Bilirubin: 1 mg/dL (ref 0.3–1.2)
Total Protein: 5.9 g/dL — ABNORMAL LOW (ref 6.5–8.1)

## 2019-10-17 LAB — TYPE AND SCREEN
ABO/RH(D): A NEG
Antibody Screen: NEGATIVE

## 2019-10-17 LAB — APTT: aPTT: 27 seconds (ref 24–36)

## 2019-10-17 LAB — PROTIME-INR
INR: 1.3 — ABNORMAL HIGH (ref 0.8–1.2)
Prothrombin Time: 16.4 seconds — ABNORMAL HIGH (ref 11.4–15.2)

## 2019-10-17 LAB — CBC
HCT: 26.1 % — ABNORMAL LOW (ref 36.0–46.0)
Hemoglobin: 9.2 g/dL — ABNORMAL LOW (ref 12.0–15.0)
MCH: 30.9 pg (ref 26.0–34.0)
MCHC: 35.2 g/dL (ref 30.0–36.0)
MCV: 87.6 fL (ref 80.0–100.0)
Platelets: 7 10*3/uL — CL (ref 150–400)
RBC: 2.98 MIL/uL — ABNORMAL LOW (ref 3.87–5.11)
RDW: 11.8 % (ref 11.5–15.5)
WBC: 0.2 10*3/uL — CL (ref 4.0–10.5)
nRBC: 0 % (ref 0.0–0.2)

## 2019-10-17 LAB — SARS CORONAVIRUS 2 (TAT 6-24 HRS): SARS Coronavirus 2: NEGATIVE

## 2019-10-17 MED ORDER — PANTOPRAZOLE SODIUM 40 MG PO TBEC
40.0000 mg | DELAYED_RELEASE_TABLET | Freq: Every day | ORAL | Status: DC
  Administered 2019-10-18 – 2019-10-26 (×9): 40 mg via ORAL
  Filled 2019-10-17 (×10): qty 1

## 2019-10-17 MED ORDER — VANCOMYCIN HCL 10 G IV SOLR
2000.0000 mg | Freq: Once | INTRAVENOUS | Status: AC
  Administered 2019-10-18: 2000 mg via INTRAVENOUS
  Filled 2019-10-17: qty 2000

## 2019-10-17 MED ORDER — OXYCODONE-ACETAMINOPHEN 5-325 MG PO TABS
1.0000 | ORAL_TABLET | ORAL | Status: DC | PRN
  Administered 2019-10-18 – 2019-10-26 (×9): 1 via ORAL
  Filled 2019-10-17 (×11): qty 1

## 2019-10-17 MED ORDER — SERTRALINE HCL 100 MG PO TABS
200.0000 mg | ORAL_TABLET | Freq: Every day | ORAL | Status: DC
  Administered 2019-10-17 – 2019-10-26 (×10): 200 mg via ORAL
  Filled 2019-10-17 (×10): qty 2

## 2019-10-17 MED ORDER — SODIUM CHLORIDE 0.9% IV SOLUTION
Freq: Once | INTRAVENOUS | Status: AC
  Administered 2019-10-18: 05:00:00 via INTRAVENOUS

## 2019-10-17 MED ORDER — LEVOTHYROXINE SODIUM 75 MCG PO TABS
75.0000 ug | ORAL_TABLET | Freq: Every day | ORAL | Status: DC
  Administered 2019-10-18 – 2019-10-27 (×10): 75 ug via ORAL
  Filled 2019-10-17 (×10): qty 1

## 2019-10-17 MED ORDER — DIPHENOXYLATE-ATROPINE 2.5-0.025 MG PO TABS
1.0000 | ORAL_TABLET | Freq: Four times a day (QID) | ORAL | Status: DC | PRN
  Administered 2019-10-18 – 2019-10-23 (×11): 1 via ORAL
  Filled 2019-10-17 (×11): qty 1

## 2019-10-17 MED ORDER — TBO-FILGRASTIM 480 MCG/0.8ML ~~LOC~~ SOSY
480.0000 ug | PREFILLED_SYRINGE | Freq: Every day | SUBCUTANEOUS | Status: DC
  Administered 2019-10-17 – 2019-10-23 (×7): 480 ug via SUBCUTANEOUS
  Filled 2019-10-17 (×7): qty 0.8

## 2019-10-17 MED ORDER — ONDANSETRON HCL 4 MG/2ML IJ SOLN
4.0000 mg | Freq: Four times a day (QID) | INTRAMUSCULAR | Status: DC | PRN
  Administered 2019-10-18 – 2019-10-25 (×4): 4 mg via INTRAVENOUS
  Filled 2019-10-17 (×4): qty 2

## 2019-10-17 MED ORDER — SODIUM CHLORIDE 0.9% IV SOLUTION
Freq: Once | INTRAVENOUS | Status: AC
  Administered 2019-10-18: 01:00:00 via INTRAVENOUS

## 2019-10-17 MED ORDER — ONDANSETRON HCL 4 MG PO TABS: 4.0000 mg | ORAL_TABLET | Freq: Four times a day (QID) | ORAL | Status: DC | PRN

## 2019-10-17 MED ORDER — PIPERACILLIN-TAZOBACTAM 3.375 G IVPB 30 MIN: 3.3750 g | Freq: Three times a day (TID) | INTRAVENOUS | Status: DC

## 2019-10-17 MED ORDER — MAGIC MOUTHWASH: 5.0000 mL | Freq: Four times a day (QID) | ORAL | Status: DC | PRN

## 2019-10-17 MED ORDER — HYDROCODONE-ACETAMINOPHEN 5-325 MG PO TABS: 1.0000 | ORAL_TABLET | ORAL | Status: DC | PRN

## 2019-10-17 MED ORDER — OXYCODONE-ACETAMINOPHEN 10-325 MG PO TABS: 1.0000 | ORAL_TABLET | ORAL | Status: DC | PRN

## 2019-10-17 MED ORDER — PROCHLORPERAZINE MALEATE 10 MG PO TABS: 10.0000 mg | ORAL_TABLET | Freq: Four times a day (QID) | ORAL | Status: DC | PRN

## 2019-10-17 MED ORDER — ACETAMINOPHEN 325 MG PO TABS
650.0000 mg | ORAL_TABLET | Freq: Four times a day (QID) | ORAL | Status: DC | PRN
  Administered 2019-10-20: 650 mg via ORAL
  Filled 2019-10-17: qty 2

## 2019-10-17 MED ORDER — ATORVASTATIN CALCIUM 10 MG PO TABS
20.0000 mg | ORAL_TABLET | ORAL | Status: DC
  Administered 2019-10-18 – 2019-10-26 (×5): 20 mg via ORAL
  Filled 2019-10-17 (×5): qty 2

## 2019-10-17 MED ORDER — CIPROFLOXACIN HCL 500 MG PO TABS: 500.0000 mg | ORAL_TABLET | Freq: Two times a day (BID) | ORAL | Status: DC

## 2019-10-17 MED ORDER — VANCOMYCIN HCL 10 G IV SOLR: 1250.0000 mg | INTRAVENOUS | Status: DC

## 2019-10-17 MED ORDER — PIPERACILLIN-TAZOBACTAM 3.375 G IVPB
3.3750 g | Freq: Three times a day (TID) | INTRAVENOUS | Status: DC
  Administered 2019-10-18: 3.375 g via INTRAVENOUS
  Filled 2019-10-17 (×3): qty 50

## 2019-10-17 MED ORDER — DIPHENOXYLATE-ATROPINE 2.5-0.025 MG/5ML PO LIQD: 5.0000 mL | Freq: Four times a day (QID) | ORAL | Status: DC | PRN

## 2019-10-17 MED ORDER — OXYCODONE HCL 5 MG PO TABS
5.0000 mg | ORAL_TABLET | ORAL | Status: DC | PRN
  Administered 2019-10-17 – 2019-10-27 (×16): 5 mg via ORAL
  Filled 2019-10-17 (×17): qty 1

## 2019-10-17 MED ORDER — ACETAMINOPHEN 650 MG RE SUPP: 650.0000 mg | Freq: Four times a day (QID) | RECTAL | Status: DC | PRN

## 2019-10-17 MED ORDER — SODIUM CHLORIDE 0.9 % IV SOLN
INTRAVENOUS | Status: AC
  Administered 2019-10-18: 01:00:00 via INTRAVENOUS

## 2019-10-17 MED ORDER — MAGIC MOUTHWASH W/LIDOCAINE
5.0000 mL | Freq: Four times a day (QID) | ORAL | Status: DC | PRN
  Filled 2019-10-17: qty 5

## 2019-10-17 NOTE — Progress Notes (Signed)
CRITICAL VALUE STICKER  CRITICAL VALUE: WBC 0.2 & platelets 7. Waiting on IV placement about to start transfusion shortly. Patient sent to floor without IV.  DATE & TIME NOTIFIED:  10/17/19 @ 2100  MESSENGER (representative from lab): Larene Beach  MD NOTIFIED:  Baltazar Najjar  TIME OF NOTIFICATION: 2115  RESPONSE:  Awaiting

## 2019-10-17 NOTE — Progress Notes (Signed)
Report for admission called to [place], RN on [facility]. Awaiting room to be cleaned to take to floor. @ 1800--transported patient to [facility] via W/C in good condition.

## 2019-10-17 NOTE — Progress Notes (Addendum)
[place]   [phone] [phone]   Clinic Follow up Note   Patient Care Team: Hoyt Koch, MD as PCP - General (Internal Medicine) Inda Castle, MD (Inactive) (Gastroenterology) Nicholaus Bloom, MD (Anesthesiology) Arna Snipe, RN as Oncology Nurse Navigator 10/17/2019  CHIEF COMPLAINT: Nadir check   INTERVAL HISTORY: Joanne Klein presents for lab and f/u as scheduled. She is with her niece. She feels exhausted. Not out of bed much but manages by herself at home. She is not eating well due to oral/throat pain which is 10/10. Her chronic pain meds do not help. She drinks 4-5 cups daily. She has diarrhea, up to 6 episodes today. She has taken 4 imodium daily without relief. There is little form to her stool. She has rectal bleeding and right nostril bleeding when blowing nose. She notes sometimes "blood pours out like urine" during BM. Denies oral bleeding. This has remained fairly steady while on treatment. No fever or chills. She continues cipro for prophylaxis.    MEDICAL HISTORY:  Past Medical History:  Diagnosis Date  . Allergic rhinitis   . Arthritis   . Bursitis    Both Shoulders  . Chronic low back pain   . Degenerative disk disease   . Depression   . Diverticulitis   . Emphysema of lung ([place])   . GERD (gastroesophageal reflux disease)   . History of fibromyalgia   . Hypertension   . Hypothyroidism   . Irritable bowel syndrome   . Neuralgia, post-herpetic   . Obesity, unspecified   . Obstructive sleep apnea   . OCD (obsessive compulsive disorder)   . Pain in joint, site unspecified   . Pure hypercholesterolemia   . Scoliosis   . Situational stress   . Spastic colon   . Tubular adenoma   . Uterine cancer [facility])     SURGICAL HISTORY: Past Surgical History:  Procedure Laterality Date  . BREAST LUMPECTOMY     knot removed  . CHOLECYSTECTOMY    . HYSTEROTOMY    . TONSILLECTOMY    . tumor in left forearm       I have reviewed the social history and family history with the patient and they are unchanged from previous note.  ALLERGIES:  is allergic to codeine; morphine and related; prozac [fluoxetine hcl]; and sulfa antibiotics.  MEDICATIONS:  Current Outpatient Medications  Medication Sig Dispense Refill  . atorvastatin (LIPITOR) 20 MG tablet Take 1 tablet (20 mg total) by mouth every other day. 45 tablet 3  . Cholecalciferol (VITAMIN D-3) 1000 UNITS CAPS Take 1 capsule by mouth daily.     . ciprofloxacin (CIPRO) 500 MG tablet Take 1 tablet (500 mg total) by mouth 2 (two) times daily. 14 tablet 0  . levothyroxine (SYNTHROID) 75 MCG tablet TAKE 1 TABLET BY MOUTH  DAILY BEFORE BREAKFAST (Patient taking differently: Take 75 mcg by mouth daily before breakfast. ) 90 tablet 1  . magic mouthwash SOLN Take 5 mLs by mouth 4 (four) times daily as needed for mouth pain. Swish and spit 240 mL 1  . omeprazole (PRILOSEC) 40 MG capsule TAKE 1 CAPSULE BY MOUTH  DAILY 90 capsule 3  . ondansetron (ZOFRAN) 8 MG tablet Take 1 tablet (8 mg total) by mouth every 8 (eight) hours as needed for nausea or vomiting. 60 tablet 1  . oxyCODONE-acetaminophen (PERCOCET) 10-325 MG tablet Take 1 tablet by mouth every 4 (four) hours as needed for pain. Gets from another provider @@  180 per month    . prochlorperazine (COMPAZINE) 10 MG tablet Take 1 tablet (10 mg total) by mouth every 6 (six) hours as needed. (Patient taking differently: Take 10 mg by mouth every 6 (six) hours as needed for nausea or vomiting. ) 20 tablet 0  . sertraline (ZOLOFT) 100 MG tablet Take 2 tablets (200 mg total) by mouth at bedtime. 180 tablet 3  . prochlorperazine (COMPAZINE) 10 MG tablet Take 1 tablet (10 mg total) by mouth every 6 (six) hours as needed. 60 tablet 1   No current facility-administered medications for this visit.     PHYSICAL EXAMINATION: ECOG PERFORMANCE STATUS: 3 - Symptomatic, >50% confined to bed  Vitals:   10/17/19 1449  BP:  133/69  Pulse: 80  Resp: 20  Temp: 98.5 F (36.9 C)  SpO2: 93%   Filed Weights   10/17/19 1449  Weight: 231 lb 14.4 oz (105.2 kg)    GENERAL: no distress but uncomfortable SKIN: no rash. Generalized bruising  EYES: sclera clear OROPHARYNX: no obvious oral thrush or ulcers LUNGS: clear with normal breathing effort HEART: regular rate & rhythm; left lower leg appears slightly larger than right. No calf tenderness  ABDOMEN:abdomen soft, non-tender and normal bowel sounds  NEURO: alert & oriented x 3 with fluent speech, generalized weakness RECTAL: external exam reveals hyperpigmentation to perineum with small left anal ulceration  LABORATORY DATA:  I have reviewed the data as listed CBC Latest Ref Rng & Units 10/17/2019 10/14/2019 10/13/2019  WBC 4.0 - 10.5 K/uL 0.2(LL) 0.5(LL) 0.7(LL)  Hemoglobin 12.0 - 15.0 g/dL 9.7(L) 11.3(L) 11.5(L)  Hematocrit 36.0 - 46.0 % 27.1(L) 31.0(L) 32.8(L)  Platelets 150 - 400 K/uL 8(LL) 18(L) 31(L)     CMP Latest Ref Rng & Units 10/12/2019 10/11/2019 10/03/2019  Glucose 70 - 99 mg/dL 102(H) 104(H) 132(H)  BUN 8 - 23 mg/dL 20 23 28(H)  Creatinine 0.44 - 1.00 mg/dL 0.89 0.84 1.08(H)  Sodium 135 - 145 mmol/L 139 142 141  Potassium 3.5 - 5.1 mmol/L 3.6 3.6 3.8  Chloride 98 - 111 mmol/L 109 109 107  CO2 22 - 32 mmol/L 23 23 25   Calcium 8.9 - 10.3 mg/dL 8.1(L) 8.0(L) 8.8(L)  Total Protein 6.5 - 8.1 g/dL 6.6 6.4(L) 7.0  Total Bilirubin 0.3 - 1.2 mg/dL 1.3(H) 0.6 0.6  Alkaline Phos 38 - 126 U/L 62 68 83  AST 15 - 41 U/L 14(L) 11(L) 13(L)  ALT 0 - 44 U/L 10 6 11       RADIOGRAPHIC STUDIES: I have personally reviewed the radiological images as listed and agreed with the findings in the report. No results found.   ASSESSMENT & PLAN:  1. 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 Biopsy 08/30/2019 confirmed well-differentiated squamous cell carcinoma with positive P 16 and p63 stains ? CTs 09/14/2019-abnormal soft tissue fullness at the lower  anus/perianal soft tissues, asymmetric left inguinal lymphadenopathy, borderline enlarged left external iliac node, mild stranding and cutaneous thickening of the left gluteal fold, 2 to 3 mm pulmonary nodules, 1.6 cm right hepatic lesion-likely a hemangioma ? Began concurrent chemoRT with 5FU/mitomycin on 10/03/2019 2. COPD 3. Fibromyalgia 4. Chronic back pain 5. Hypothyroid 6. Depression 7. History of uterine cancer-age 57 8. Pain secondary to #1 9. Orthostatic hypotension, fatigue, dyspnea on exertion, early mucositis requiring supportive care on day 9, secondary to chemotherapy  10. Thrombocytopenia PLT 58K and neutropenia ANC 1.4 on day 9, secondary to chemotherapy 11. Worsening cytopenias, PLT 8K and ANC 0.0 on  day 15, secondary to chemotherapy. Started prophylaxis with cipro on 10/22 day 11  Disposition:  Mr. Muldrew appears weak. She is day 15 following 5FU/mitocycin-C and radiation. She has tolerated treatment poorly with mucositis, dehydration, and progressive pancytopenia. ANC is 0.0, PLT 8K today. She received platelet transfusion on 10/23. She is having rectal bleeding and mild epistaxis. She began prophylactic cipro for neutropenia on 10/22. We discussed her infection and bleeding risks. Radiation on hold since 10/23 due to neutropenia.   The patient was seen with Dr. Benay Spice. Due to active bleeding and severe thrombocytopenia, we would recommend urgent transfusion in clinic. Due to staffing issue and timing, she is not able to receive platelets today. Therefore Dr. Benay Spice recommends inpatient admission for platelet transfusion and supportive care, the patient agrees. COVID19 testing was done in clinic. She is being referred for direct admission. Will follow inpatient.   All questions were answered. The patient knows to call the clinic with any problems, questions or concerns. No barriers to learning was detected.     Alla Feeling, NP 10/17/19  This was a shared visit with  Cira Rue.  Ms. Pask was interviewed and examined.  She has developed severe pancytopenia, now at day 15 following cycle 1 fluorouracil/mitomycin-C.  She has persistent diarrhea.  She will be admitted for platelet transfusion support, intravenous hydration, and broad-spectrum IV antibiotics if she has a fever spike.  I appreciate the care from the medical service.  I recommend continuing ciprofloxacin prophylaxis.  We will add G-CSF if the white count has not improved in the next 1-2 days.  Julieanne Manson, MD

## 2019-10-17 NOTE — Progress Notes (Signed)
Pharmacy Antibiotic Note  Joanne Klein is a 73 y.o. female admitted on 10/17/2019 with FN.  Pharmacy has been consulted for vancomycin dosing.  Plan: 1) Vanc 2g IV x 1 then 1250mg  IV q24 - goal AUC 400-550 2) Zosyn per Md - continue ordered dosing 3) Daily SCr     Temp (24hrs), Avg:98.4 F (36.9 C), Min:98.3 F (36.8 C), Max:98.5 F (36.9 C)  Recent Labs  Lab 10/11/19 1329 10/12/19 1827 10/13/19 1443 10/14/19 1259 10/17/19 1436 10/17/19 1945  WBC 2.1* 1.4* 0.7* 0.5* 0.2*  --   CREATININE 0.84 0.89  --   --   --  0.92    Estimated Creatinine Clearance: 71.2 mL/min (by C-G formula based on SCr of 0.92 mg/dL).    Allergies  Allergen Reactions  . Prozac [Fluoxetine Hcl] Anaphylaxis  . Codeine Itching  . Morphine And Related Itching  . Sulfa Antibiotics Itching and Nausea Only    Thank you for allowing pharmacy to be a part of this patient's care.  Kara Mead 10/17/2019 9:01 PM

## 2019-10-17 NOTE — H&P (Addendum)
Joanne Klein [id number] DOB: [birth year] DOA: 10/17/2019    PCP: Hoyt Koch, MD   Outpatient Specialists:     Oncology   Dr. Marca Ancona Dr.  Annamaria Boots  Patient arrived to ER on  at   Patient coming from: home Lives alone,        Chief Complaint: Thrombocytopenia   HPI: Joanne Klein is a 73 y.o. 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  She continues to have spontaneous epistaxis Per oncology note patient is currently on Cipro for prophylaxis of neutropenia since 22 October Her radiation has been on hold since 24 October.  She was seen in clinic today and was arranged for direct admission for platelets transfusion supportive care testing Covid was done in the clinic  Denies any fever or chills, occasional mild SOB/CP, no cough  Reports rectal bleeding ever since her Radiation  Infectious risk factors:  Reports Diarrhea    fatigue   In  ER RAPID COVID TEST in house testing  Pending  No results found for: SARSCOV2NAA   Regarding pertinent Chronic problems:   Rectal cancer currently chemo XRT on  5FU/mitomycin since 10/03/2019   Hyperlipidemia - on statins Lipitor   HTN on not on any medications  Hypothyroidism:  Lab Results  Component Value Date   TSH 2.69 02/17/2019   on synthroid    obesity-   BMI Readings from Last 1 Encounters:  10/17/19 35.26  kg/m    COPD - followed by pulmonology       OSA -non-need for CPAP   While in ER:  The following Work up has been ordered so far:  Orders Placed This Encounter  Procedures  . CBC with Differential  . Practitioner attestation of consent  . Complete patient signature process for consent form  . Prepare Pheresed Platelets  . ABO/Rh    Following Medications were ordered in ER: Medications  0.9 %  sodium chloride infusion (Manually program via Guardrails IV Fluids) (has no administration in time range)    Otherwise labs showing:    Recent Labs  Lab 10/11/19 1329 10/12/19 1827  NA 142 139  K 3.6 3.6  CO2 23 23  GLUCOSE 104* 102*  BUN 23 20  CREATININE 0.84 0.89  CALCIUM 8.0* 8.1*    Cr     stable,   Lab Results  Component Value Date   CREATININE 0.89 10/12/2019   CREATININE 0.84 10/11/2019   CREATININE 1.08 (H) 10/03/2019    Recent Labs  Lab 10/11/19 1329 10/12/19 1827  AST 11* 14*  ALT 6 10  ALKPHOS 68 62  BILITOT 0.6 1.3*  PROT 6.4* 6.6  ALBUMIN 3.2* 3.5   Lab Results  Component Value Date   CALCIUM 8.1 (L) 10/12/2019      WBC      Component Value Date/Time   WBC 0.2 (LL) 10/17/2019 1436   WBC 1.4 (LL) 10/12/2019 1827  ANC    Component Value Date/Time   NEUTROABS 0.0 (LL) 10/17/2019 1436    Plt: Lab Results  Component Value Date   PLT 8 (LL) 10/17/2019      COVID-19 Labs    No results found for: SARSCOV2NAA   HG/HCT   At baseline see below    Component Value Date/Time   HGB 9.7 (L) 10/17/2019 1436   HCT 27.1 (L) 10/17/2019 1436      ECG: not Ordered     DM  labs:  HbA1C: Recent Labs    02/17/19 1637  HGBA1C 5.2   CBG (last 3)  No results for input(s): GLUCAP in the last 72 hours.    UA  not ordered   Ordered 10/12/2019  CXR - NON acute   CTA chest -  nonacute, no PE,       ED Triage Vitals [10/17/19 1825]  Enc Vitals Group     BP (!) 158/80     Pulse Rate 83     Resp 20     Temp 98.3 F (36.8 C)      Temp Source Oral     SpO2 98 %     Weight      Height      Head Circumference      Peak Flow      Pain Score 7     Pain Loc      Pain Edu?      Excl. in [place]?   [id number]       Latest  Blood pressure (!) 158/80, pulse 83, temperature 98.3 F (36.8 C), temperature source Oral, resp. rate 20, SpO2 98 %.    Hospitalist was called for admission for thrombocytopenia  Review of Systems:    Pertinent positives include: diarrhea, blood in stool  Constitutional:  No weight loss, night sweats, Fevers, chills, fatigue, weight loss  HEENT:  No headaches, Difficulty swallowing,Tooth/dental problems,Sore throat,  No sneezing, itching, ear ache, nasal congestion, post nasal drip,  Cardio-vascular:  No chest pain, Orthopnea, PND, anasarca, dizziness, palpitations.no Bilateral lower extremity swelling  GI:  No heartburn, indigestion, abdominal pain, nausea, vomiting,  change in bowel habits, loss of appetite, melena, blood in stool, hematemesis Resp:  no shortness of breath at rest. No dyspnea on exertion, No excess mucus, no productive cough, No non-productive cough, No coughing up of blood.No change in color of mucus.No wheezing. Skin:  no rash or lesions. No jaundice GU:  no dysuria, change in color of urine, no urgency or frequency. No straining to urinate.  No flank pain.  Musculoskeletal:  No joint pain or no joint swelling. No decreased range of motion. No back pain.  Psych:  No change in mood or affect. No depression or anxiety. No memory loss.  Neuro: no localizing neurological complaints, no tingling, no weakness, no double vision, no gait abnormality, no slurred speech, no confusion  All systems reviewed and apart from [place] all are negative  Past Medical History:   Past Medical History:  Diagnosis Date  . Allergic rhinitis   . Arthritis   . Bursitis    Both Shoulders  . Chronic low back pain   . Degenerative disk disease   . Depression   . Diverticulitis   . Emphysema  of lung ([place])   . GERD (gastroesophageal reflux disease)   . History of fibromyalgia   . Hypertension   . Hypothyroidism   . Irritable bowel syndrome   . Neuralgia, post-herpetic   .  Obesity, unspecified   . Obstructive sleep apnea   . OCD (obsessive compulsive disorder)   . Pain in joint, site unspecified   . Pure hypercholesterolemia   . Scoliosis   . Situational stress   . Spastic colon   . Tubular adenoma   . Uterine cancer [facility])      Past Surgical History:  Procedure Laterality Date  . BREAST LUMPECTOMY     knot removed  . CHOLECYSTECTOMY    . HYSTEROTOMY    . TONSILLECTOMY    . tumor in left forearm      Social History:  Ambulatory   Independently but has to hold on the walls    reports that she quit smoking about 22 years ago. Her smoking use included cigarettes. She has a 68.00 pack-year smoking history. She has never used smokeless tobacco. She reports that she does not drink alcohol or use drugs.   Family History:   Family History  Problem Relation Age of Onset  . Stomach cancer Father   . Liver cancer Father   . Diabetes Father   . Colon cancer Neg Hx     Allergies: Allergies  Allergen Reactions  . Codeine   . Morphine And Related   . Prozac [Fluoxetine Hcl]   . Sulfa Antibiotics      Prior to Admission medications   Medication Sig Start Date End Date Taking? Authorizing Provider  atorvastatin (LIPITOR) 20 MG tablet Take 1 tablet (20 mg total) by mouth every other day. 02/17/19   Hoyt Koch, MD  Cholecalciferol (VITAMIN D-3) 1000 UNITS CAPS Take 1 capsule by mouth daily.     [provider]  ciprofloxacin (CIPRO) 500 MG tablet Take 1 tablet (500 mg total) by mouth 2 (two) times daily. 10/13/19   Owens Shark, NP  levothyroxine (SYNTHROID) 75 MCG tablet TAKE 1 TABLET BY MOUTH  DAILY BEFORE BREAKFAST Patient taking differently: Take 75 mcg by mouth daily before breakfast.  08/31/19   Hoyt Koch, MD  magic mouthwash  SOLN Take 5 mLs by mouth 4 (four) times daily as needed for mouth pain. Swish and spit 10/12/19   Ladell Pier, MD  omeprazole (PRILOSEC) 40 MG capsule TAKE 1 CAPSULE BY MOUTH  DAILY 04/18/19   Hoyt Koch, MD  ondansetron (ZOFRAN) 8 MG tablet Take 1 tablet (8 mg total) by mouth every 8 (eight) hours as needed for nausea or vomiting. 09/23/19   Ladell Pier, MD  oxyCODONE-acetaminophen (PERCOCET) 10-325 MG tablet Take 1 tablet by mouth every 4 (four) hours as needed for pain. Gets from another provider @@ 180 per month    [provider]  prochlorperazine (COMPAZINE) 10 MG tablet Take 1 tablet (10 mg total) by mouth every 6 (six) hours as needed. 09/23/19   Ladell Pier, MD  prochlorperazine (COMPAZINE) 10 MG tablet Take 1 tablet (10 mg total) by mouth every 6 (six) hours as needed. Patient taking differently: Take 10 mg by mouth every 6 (six) hours as needed for nausea or vomiting.  10/03/19   Ladell Pier, MD  sertraline (ZOLOFT) 100 MG tablet Take 2 tablets (200 mg total) by mouth at bedtime. 02/17/19   Hoyt Koch, MD   Physical Exam: Blood pressure (!) 158/80, pulse 83, temperature 98.3 F (36.8 C), temperature source Oral, resp. rate 20, SpO2 98 %. 1. General:  in No Acute distress   Chronically ill  -appearing 2. Psychological: Alert and Oriented 3. Head/ENT:  Dry Mucous Membranes                          Head Non traumatic, neck supple                          Poor Dentition 4. SKIN:  decreased Skin turgor,  Skin clean Dry and intact no rash 5. Heart: Regular rate and rhythm no Murmur, no Rub or gallop 6. Lungs:   no wheezes or crackles   7. Abdomen: Soft, non-tender, Non distended  obese bowel sounds present 8. Lower extremities: no clubbing, cyanosis, no  Edema L>R 9. Neurologically Grossly intact, moving all 4 extremities equally  10. MSK: Normal range of motion   All other LABS:     Recent Labs  Lab 10/11/19 1329 10/12/19 1827  10/13/19 1443 10/14/19 1259 10/17/19 1436  WBC 2.1* 1.4* 0.7* 0.5* 0.2*  NEUTROABS 1.4* 0.6* 0.3* 0.0* 0.0*  HGB 12.8 12.6 11.5* 11.3* 9.7*  HCT 36.0 36.8 32.8* 31.0* 27.1*  MCV 87.0 88.9 86.5 85.4 85.5  PLT 58* 46* 31* 18* 8*     Recent Labs  Lab 10/11/19 1329 10/12/19 1827  NA 142 139  K 3.6 3.6  CL 109 109  CO2 23 23  GLUCOSE 104* 102*  BUN 23 20  CREATININE 0.84 0.89  CALCIUM 8.0* 8.1*     Recent Labs  Lab 10/11/19 1329 10/12/19 1827  AST 11* 14*  ALT 6 10  ALKPHOS 68 62  BILITOT 0.6 1.3*  PROT 6.4* 6.6  ALBUMIN 3.2* 3.5       Cultures:    Component Value Date/Time   SDES URINE, CLEAN CATCH 04/20/2008 0800   SPECREQUEST NONE 04/20/2008 0800   CULT  04/20/2008 0800    Multiple bacterial morphotypes present, none predominant. Suggest appropriate recollection if clinically indicated.   REPTSTATUS 04/21/2008 FINAL 04/20/2008 0800     Radiological Exams on Admission: No results found.  Chart has been reviewed   Assessment/Plan  73 y.o. 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 Thrombocytopenia ([place]) - due to chemotherapy, will transfuse and follow CBC.  Appreciate oncology consult  Pancytopenia secondary to chemotherapy.  Transfuse platelets, type and screen in case needs blood transfusion discussed with Dr. Marin Olp who recommended initiation of IV antibiotics and Granix Protective precautions.  Orpah Cobb cancer [facility]) oncology is aware patient status post radiation and chemotherapy currently at hold as patient has trouble tolerating.  . Depression -chronic stable continue home medications . Hypothyroidism -- Check TSH continue home medications at current dose  . GERD (gastroesophageal reflux disease)  -chronic stable continue home medications   .  COPD GOLD II  -chronic stable continue home medications  . Hyperlipidemia -chronic stable continue home medications  Diarrhea persistent most likely secondary to recent radiation/chemotherapy. Initiate Lomotil patient did not have improvement with Imodium  Rectal bleeding in the setting of thrombocytopenia will repeat CBC to make sure is no significant drop in transfuse platelets.  Transfuse blood as needed. Other plan as per orders.  Left leg swelling had recent Doppler studies and CTA which was negative for blood clots  Mucositis ordered Magic mouthwash with lidocaine and nystatin  Poor nutritional status will order nutritional consult and check prealbumin in the morning  DVT  prophylaxis:  SCD   Code Status:   DNR/DNI as per patient  I had personally discussed CODE STATUS with patient    Family Communication:   Family not at  Bedside    Disposition Plan:   To home once workup is complete and patient is stable                    Would benefit from PT/OT eval prior to DC  Ordered                                   Consults called: oncology aware   Admission status:  ED Disposition    None      Obs      Nereyda Bowler 10/17/2019, 10:11 PM    [facility]     after 2 AM please page floor coverage PA If 7AM-7PM, please contact the day team taking care of the patient using [email or web address]

## 2019-10-18 ENCOUNTER — Telehealth: Payer: Self-pay | Admitting: Nurse Practitioner

## 2019-10-18 ENCOUNTER — Inpatient Hospital Stay (HOSPITAL_COMMUNITY): Payer: Medicare Other

## 2019-10-18 ENCOUNTER — Encounter (HOSPITAL_COMMUNITY): Payer: Self-pay

## 2019-10-18 ENCOUNTER — Ambulatory Visit: Payer: Medicare Other

## 2019-10-18 ENCOUNTER — Inpatient Hospital Stay: Payer: Self-pay

## 2019-10-18 DIAGNOSIS — K625 Hemorrhage of anus and rectum: Secondary | ICD-10-CM

## 2019-10-18 DIAGNOSIS — G4733 Obstructive sleep apnea (adult) (pediatric): Secondary | ICD-10-CM

## 2019-10-18 DIAGNOSIS — M797 Fibromyalgia: Secondary | ICD-10-CM

## 2019-10-18 DIAGNOSIS — E039 Hypothyroidism, unspecified: Secondary | ICD-10-CM

## 2019-10-18 DIAGNOSIS — M1990 Unspecified osteoarthritis, unspecified site: Secondary | ICD-10-CM

## 2019-10-18 DIAGNOSIS — G8929 Other chronic pain: Secondary | ICD-10-CM

## 2019-10-18 DIAGNOSIS — C210 Malignant neoplasm of anus, unspecified: Secondary | ICD-10-CM | POA: Diagnosis not present

## 2019-10-18 DIAGNOSIS — D696 Thrombocytopenia, unspecified: Secondary | ICD-10-CM

## 2019-10-18 DIAGNOSIS — R5383 Other fatigue: Secondary | ICD-10-CM

## 2019-10-18 DIAGNOSIS — J449 Chronic obstructive pulmonary disease, unspecified: Secondary | ICD-10-CM

## 2019-10-18 DIAGNOSIS — E440 Moderate protein-calorie malnutrition: Secondary | ICD-10-CM

## 2019-10-18 DIAGNOSIS — T451X5A Adverse effect of antineoplastic and immunosuppressive drugs, initial encounter: Secondary | ICD-10-CM

## 2019-10-18 DIAGNOSIS — E669 Obesity, unspecified: Secondary | ICD-10-CM

## 2019-10-18 DIAGNOSIS — D61810 Antineoplastic chemotherapy induced pancytopenia: Principal | ICD-10-CM

## 2019-10-18 DIAGNOSIS — Z79899 Other long term (current) drug therapy: Secondary | ICD-10-CM

## 2019-10-18 DIAGNOSIS — F429 Obsessive-compulsive disorder, unspecified: Secondary | ICD-10-CM

## 2019-10-18 DIAGNOSIS — E7800 Pure hypercholesterolemia, unspecified: Secondary | ICD-10-CM

## 2019-10-18 DIAGNOSIS — E876 Hypokalemia: Secondary | ICD-10-CM | POA: Diagnosis not present

## 2019-10-18 DIAGNOSIS — D701 Agranulocytosis secondary to cancer chemotherapy: Secondary | ICD-10-CM

## 2019-10-18 DIAGNOSIS — F329 Major depressive disorder, single episode, unspecified: Secondary | ICD-10-CM

## 2019-10-18 DIAGNOSIS — M549 Dorsalgia, unspecified: Secondary | ICD-10-CM

## 2019-10-18 DIAGNOSIS — Z8542 Personal history of malignant neoplasm of other parts of uterus: Secondary | ICD-10-CM

## 2019-10-18 DIAGNOSIS — R197 Diarrhea, unspecified: Secondary | ICD-10-CM

## 2019-10-18 DIAGNOSIS — K1379 Other lesions of oral mucosa: Secondary | ICD-10-CM

## 2019-10-18 DIAGNOSIS — K589 Irritable bowel syndrome without diarrhea: Secondary | ICD-10-CM

## 2019-10-18 LAB — COMPREHENSIVE METABOLIC PANEL
ALT: 10 U/L (ref 0–44)
AST: 9 U/L — ABNORMAL LOW (ref 15–41)
Albumin: 2.8 g/dL — ABNORMAL LOW (ref 3.5–5.0)
Alkaline Phosphatase: 46 U/L (ref 38–126)
Anion gap: 9 (ref 5–15)
BUN: 18 mg/dL (ref 8–23)
CO2: 23 mmol/L (ref 22–32)
Calcium: 7.4 mg/dL — ABNORMAL LOW (ref 8.9–10.3)
Chloride: 108 mmol/L (ref 98–111)
Creatinine, Ser: 0.87 mg/dL (ref 0.44–1.00)
GFR calc Af Amer: >60 mL/min (ref >60)
GFR calc non Af Amer: >60 mL/min (ref >60)
Glucose, Bld: 112 mg/dL — ABNORMAL HIGH (ref 70–99)
Potassium: 2.7 mmol/L — CL (ref 3.5–5.1)
Sodium: 140 mmol/L (ref 135–145)
Total Bilirubin: 1 mg/dL (ref 0.3–1.2)
Total Protein: 5.6 g/dL — ABNORMAL LOW (ref 6.5–8.1)

## 2019-10-18 LAB — PREALBUMIN: Prealbumin: 10.7 mg/dL — ABNORMAL LOW (ref 18–38)

## 2019-10-18 LAB — ABO/RH: ABO/RH(D): A NEG

## 2019-10-18 LAB — BASIC METABOLIC PANEL
Anion gap: 7 (ref 5–15)
BUN: 18 mg/dL (ref 8–23)
CO2: 24 mmol/L (ref 22–32)
Calcium: 7.4 mg/dL — ABNORMAL LOW (ref 8.9–10.3)
Chloride: 109 mmol/L (ref 98–111)
Creatinine, Ser: 0.77 mg/dL (ref 0.44–1.00)
GFR calc Af Amer: >60 mL/min (ref >60)
GFR calc non Af Amer: >60 mL/min (ref >60)
Glucose, Bld: 138 mg/dL — ABNORMAL HIGH (ref 70–99)
Potassium: 2.9 mmol/L — ABNORMAL LOW (ref 3.5–5.1)
Sodium: 140 mmol/L (ref 135–145)

## 2019-10-18 LAB — PHOSPHORUS: Phosphorus: 2.7 mg/dL (ref 2.5–4.6)

## 2019-10-18 LAB — CBC WITH DIFFERENTIAL/PLATELET
HCT: 23.4 % — ABNORMAL LOW (ref 36.0–46.0)
Hemoglobin: 8.3 g/dL — ABNORMAL LOW (ref 12.0–15.0)
MCH: 30.9 pg (ref 26.0–34.0)
MCHC: 35.5 g/dL (ref 30.0–36.0)
MCV: 87 fL (ref 80.0–100.0)
Platelets: 28 10*3/uL — CL (ref 150–400)
RBC: 2.69 MIL/uL — ABNORMAL LOW (ref 3.87–5.11)
RDW: 11.6 % (ref 11.5–15.5)
WBC: 0.1 10*3/uL — CL (ref 4.0–10.5)
nRBC: 0 % (ref 0.0–0.2)

## 2019-10-18 LAB — TSH: TSH: 2.971 u[IU]/mL (ref 0.350–4.500)

## 2019-10-18 LAB — MAGNESIUM: Magnesium: 1.8 mg/dL (ref 1.7–2.4)

## 2019-10-18 MED ORDER — LIDOCAINE HCL 1 % IJ SOLN
INTRAMUSCULAR | Status: AC
  Filled 2019-10-18: qty 20

## 2019-10-18 MED ORDER — SIMETHICONE 80 MG PO CHEW: 80.0000 mg | CHEWABLE_TABLET | Freq: Once | ORAL | Status: DC

## 2019-10-18 MED ORDER — SODIUM CHLORIDE 0.9% FLUSH
10.0000 mL | INTRAVENOUS | Status: DC | PRN
  Administered 2019-10-21: 20 mL
  Administered 2019-10-26: 05:00:00 10 mL
  Filled 2019-10-18 (×2): qty 40

## 2019-10-18 MED ORDER — BARRIER CREAM NON-SPECIFIED
1.0000 "application " | TOPICAL_CREAM | Freq: Two times a day (BID) | TOPICAL | Status: DC
  Administered 2019-10-18 – 2019-10-26 (×18): 1 via TOPICAL
  Filled 2019-10-18: qty 1

## 2019-10-18 MED ORDER — HYDRALAZINE HCL 20 MG/ML IJ SOLN: 10.0000 mg | INTRAMUSCULAR | Status: DC | PRN

## 2019-10-18 MED ORDER — POLYETHYLENE GLYCOL 3350 17 G PO PACK: 17.0000 g | PACK | Freq: Every day | ORAL | Status: DC | PRN

## 2019-10-18 MED ORDER — SIMETHICONE 80 MG PO CHEW
160.0000 mg | CHEWABLE_TABLET | Freq: Once | ORAL | Status: AC
  Administered 2019-10-18: 160 mg via ORAL
  Filled 2019-10-18: qty 2

## 2019-10-18 MED ORDER — SODIUM CHLORIDE 0.9% FLUSH
10.0000 mL | Freq: Two times a day (BID) | INTRAVENOUS | Status: DC
  Administered 2019-10-18: 23:00:00 10 mL
  Administered 2019-10-19: 20 mL
  Administered 2019-10-19 – 2019-10-20 (×2): 10 mL
  Administered 2019-10-22: 20 mL
  Administered 2019-10-22 – 2019-10-26 (×6): 10 mL

## 2019-10-18 MED ORDER — SENNOSIDES-DOCUSATE SODIUM 8.6-50 MG PO TABS: 2.0000 | ORAL_TABLET | Freq: Every evening | ORAL | Status: DC | PRN

## 2019-10-18 MED ORDER — ENSURE ENLIVE PO LIQD
237.0000 mL | Freq: Two times a day (BID) | ORAL | Status: DC
  Administered 2019-10-18 – 2019-10-20 (×3): 237 mL via ORAL

## 2019-10-18 MED ORDER — LEVOFLOXACIN 500 MG PO TABS
500.0000 mg | ORAL_TABLET | Freq: Every day | ORAL | Status: DC
  Administered 2019-10-18 – 2019-10-23 (×6): 500 mg via ORAL
  Filled 2019-10-18 (×7): qty 1

## 2019-10-18 MED ORDER — CHLORHEXIDINE GLUCONATE CLOTH 2 % EX PADS
6.0000 | MEDICATED_PAD | Freq: Every day | CUTANEOUS | Status: DC
  Administered 2019-10-18 – 2019-10-26 (×8): 6 via TOPICAL

## 2019-10-18 MED ORDER — POTASSIUM CHLORIDE CRYS ER 20 MEQ PO TBCR
40.0000 meq | EXTENDED_RELEASE_TABLET | ORAL | Status: AC
  Administered 2019-10-18 (×2): 40 meq via ORAL
  Filled 2019-10-18 (×2): qty 2

## 2019-10-18 MED ORDER — POTASSIUM CHLORIDE CRYS ER 20 MEQ PO TBCR
40.0000 meq | EXTENDED_RELEASE_TABLET | ORAL | Status: AC
  Administered 2019-10-18 (×2): 40 meq via ORAL
  Filled 2019-10-18 (×2): qty 2

## 2019-10-18 NOTE — Progress Notes (Signed)
Initial Nutrition Assessment  DOCUMENTATION CODES:   Obesity unspecified  INTERVENTION:   -Ensure Enlive po BID, each supplement provides 350 kcal and 20 grams of protein  NUTRITION DIAGNOSIS:   Increased nutrient needs related to cancer and cancer related treatments as evidenced by estimated needs.  GOAL:   Patient will meet greater than or equal to 90% of their needs  MONITOR:   PO intake, Supplement acceptance, Labs, Weight trends, I & O's  REASON FOR ASSESSMENT:   Consult Assessment of nutrition requirement/status  ASSESSMENT:   73 y.o. 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 Began chemotherapy on 10/12.   **RD working remotely**  Patient admitted from cancer center with c/o mouth pain and fatigue. Pt reports not eating well given pain. Pt does not report N/V PTA.   Patient consumed 100% of breakfast this morning, providing  ~550 kcals and 20g protein. No lunch has been ordered. Will order Ensure supplements given increased needs.  Per weight records, weights have remained stable over the past year.  Medications: KLOR-CON, IV Zofran PRN Labs reviewed: Low K   NUTRITION - FOCUSED PHYSICAL EXAM:  Unable to perform -working remotely.  Diet Order:   Diet Order            DIET SOFT Room service appropriate? Yes; Fluid consistency: Thin  Diet effective now              EDUCATION NEEDS:   No education needs have been identified at this time  Skin:  Skin Assessment: Reviewed RN Assessment  Last BM:  10/27- type 6  Height:   Ht Readings from Last 1 Encounters:  10/18/19 5\' 8"  (1.727 m)    Weight:   Wt Readings from Last 1 Encounters:  10/18/19 105.2 kg    Ideal Body Weight:  63.6 kg  BMI:  Body mass index is 35.26  kg/m.  Estimated Nutritional Needs:   Kcal:  2100-2300  Protein:  95-105g  Fluid:  2.1L/day  Clayton Bibles, MS, RD, LDN Inpatient Clinical Dietitian Pager: [phone] After Hours Pager: [phone]

## 2019-10-18 NOTE — Telephone Encounter (Signed)
No los per 10/26.

## 2019-10-18 NOTE — Progress Notes (Signed)
PROGRESS NOTE    Joanne Klein  [id number] DOB: [birth year] DOA: 10/17/2019 PCP: Hoyt Koch, MD   Brief Narrative:  73 year old with history of squamous cell carcinoma of anal margin, COPD, uterine cancer, GERD, hyperlipidemia, obstructive sleep apnea, IBS, hypothyroidism, fibromyalgia, chronic lower back pain presented to the hospital with complains of fatigue, mouth pain secondary to mucositis and spontaneous rectal bleeding after first chemo treatment.  Developed severe pancytopenia/neutropenia.  On outpatient chemo and radiation.  Receiving blood transfusion and platelets transfusion.   Assessment & Plan:   Active Problems:   Depression   Hypothyroidism   GERD (gastroesophageal reflux disease)   OCD (obsessive compulsive disorder)   COPD GOLD II    Hyperlipidemia   Anal cancer (HCC)   Thrombocytopenia (HCC)  Severe neutropenia/pancytopenia Epistaxis/rectal bleeding, secondary to thrombocytopenia -Status post chemotherapy.  Transfusion as needed-PRBC and platelet.  Oncology team following.  Periodically monitor hemoglobin. -Prophylaxis with Levaquin.  Broaden coverage if necessary -PICC line requested -IV Granix -Sitz bath.  Mucositis -Magic mouthwash with lidocaine.  Nystatin  Left lower extremity swelling -Recent Dopplers and CTA are negative.  Hypokalemia -Aggressive repletion  Atypical chest pain, resolved  History of anal cancer -Currently ongoing radiation and chemotherapy.  On hold  Hypothyroidism -Continue Synthroid. -TSH 2.9  Depression -Continue home meds  Moderate to severe protein calorie malnutrition -Nutrition consulted.  Prealbumin 10.7.  GERD -PPI  History of chronic COPD -Currently stable.  As needed bronchodilators  Hyperlipidemia -Statin  PT recommends-home PT  DVT prophylaxis: SCDs Code Status: Limited code, okay with intubation.  Does not want CPR Family Communication: None Disposition Plan: Maintain hospital  stay for IV antibiotics especially in the setting of severe neutropenia.  Requiring intermittent transfusion.  Consultants:   Oncology  Procedures:   PICC placement by IR 10/27  Antimicrobials:   Empiric Levaquin   Subjective: Reporting of some sore throat/scratchy throat but no other complaints.  Denies any chest pain.  Overnight had rapid response due to atypical chest pain.  Cardiac enzymes and EKG are negative.  Review of Systems Otherwise negative except as per HPI, including: General: Denies fever, chills, night sweats or unintended weight loss. Resp: Denies cough, wheezing, shortness of breath. Cardiac: Denies chest pain, palpitations, orthopnea, paroxysmal nocturnal dyspnea. GI: Denies abdominal pain, nausea, vomiting, diarrhea or constipation GU: Denies dysuria, frequency, hesitancy or incontinence MS: Denies muscle aches, joint pain or swelling Neuro: Denies headache, neurologic deficits (focal weakness, numbness, tingling), abnormal gait Psych: Denies anxiety, depression, SI/HI/AVH Skin: Denies new rashes or lesions ID: Denies sick contacts, exotic exposures, travel  Objective: Vitals:   10/18/19 0405 10/18/19 0406 10/18/19 0451 10/18/19 0715  BP: 98/67 98/67 126/64 (!) 116/52  Pulse: 90 90 80 79  Resp: 17 17 18 18   Temp: 98.6 F (37 C) 98.6 F (37 C) 98.6 F (37 C) 99.6 F (37.6 C)  TempSrc: Oral Oral Oral Oral  SpO2: 91% 92% 91% 92%  Weight:      Height:        Intake/Output Summary (Last 24 hours) at 10/18/2019 0814 Last data filed at 10/18/2019 0300 Gross per 24 hour  Intake 740 ml  Output -  Net 740 ml   Filed Weights   10/18/19 0136  Weight: 105.2 kg    Examination:  General exam: Appears calm and comfortable  Respiratory system: Clear to auscultation. Respiratory effort normal. Cardiovascular system: S1 & S2 heard, RRR. No JVD, murmurs, rubs, gallops or clicks. No pedal edema. Gastrointestinal system: Abdomen is  nondistended, soft and  nontender. No organomegaly or masses felt. Normal bowel sounds heard. Central nervous system: Alert and oriented. No focal neurological deficits. Extremities: Symmetric 5 x 5 power. Skin: No rashes, lesions or ulcers Psychiatry: Judgement and insight appear normal. Mood & affect appropriate.   Data Reviewed:   CBC: Recent Labs  Lab 10/11/19 1329 10/12/19 1827 10/13/19 1443 10/14/19 1259 10/17/19 1436 10/17/19 1945  WBC 2.1* 1.4* 0.7* 0.5* 0.2* 0.2*  NEUTROABS 1.4* 0.6* 0.3* 0.0* 0.0*  --   HGB 12.8 12.6 11.5* 11.3* 9.7* 9.2*  HCT 36.0 36.8 32.8* 31.0* 27.1* 26.1*  MCV 87.0 88.9 86.5 85.4 85.5 87.6  PLT 58* 46* 31* 18* 8* 7*   Basic Metabolic Panel: Recent Labs  Lab 10/11/19 1329 10/12/19 1827 10/17/19 1945  NA 142 139 138  K 3.6 3.6 2.8*  CL 109 109 107  CO2 23 23 23   GLUCOSE 104* 102* 92  BUN 23 20 22   CREATININE 0.84 0.89 0.92  CALCIUM 8.0* 8.1* 7.8*   GFR: Estimated Creatinine Clearance: 71.2 mL/min (by C-G formula based on SCr of 0.92 mg/dL). Liver Function Tests: Recent Labs  Lab 10/11/19 1329 10/12/19 1827 10/17/19 1945  AST 11* 14* 11*  ALT 6 10 10   ALKPHOS 68 62 53  BILITOT 0.6 1.3* 1.0  PROT 6.4* 6.6 5.9*  ALBUMIN 3.2* 3.5 3.0*   No results for input(s): LIPASE, AMYLASE in the last 168 hours. No results for input(s): AMMONIA in the last 168 hours. Coagulation Profile: Recent Labs  Lab 10/17/19 1945  INR 1.3*   Cardiac Enzymes: No results for input(s): CKTOTAL, CKMB, CKMBINDEX, TROPONINI in the last 168 hours. BNP (last 3 results) No results for input(s): PROBNP in the last 8760 hours. HbA1C: No results for input(s): HGBA1C in the last 72 hours. CBG: No results for input(s): GLUCAP in the last 168 hours. Lipid Profile: No results for input(s): CHOL, HDL, LDLCALC, TRIG, CHOLHDL, LDLDIRECT in the last 72 hours. Thyroid Function Tests: No results for input(s): TSH, T4TOTAL, FREET4, T3FREE, THYROIDAB in the last 72 hours. Anemia Panel: No  results for input(s): VITAMINB12, FOLATE, FERRITIN, TIBC, IRON, RETICCTPCT in the last 72 hours. Sepsis Labs: No results for input(s): PROCALCITON, LATICACIDVEN in the last 168 hours.  Recent Results (from the past 240 hour(s))  SARS CORONAVIRUS 2 (TAT 6-24 HRS) Nasopharyngeal Nasopharyngeal Swab     Status: None   Collection Time: 10/17/19  4:16 PM   Specimen: Nasopharyngeal Swab  Result Value Ref Range Status   SARS Coronavirus 2 NEGATIVE NEGATIVE Final    Comment: (NOTE) SARS-CoV-2 target nucleic acids are NOT DETECTED. The SARS-CoV-2 RNA is generally detectable in upper and lower respiratory specimens during the acute phase of infection. Negative results do not preclude SARS-CoV-2 infection, do not rule out co-infections with other pathogens, and should not be used as the sole basis for treatment or other patient management decisions. Negative results must be combined with clinical observations, patient history, and epidemiological information. The expected result is Negative. Fact Sheet for Patients: SugarRoll.be Fact Sheet for Healthcare Providers: https://www.woods-mathews.com/ This test is not yet approved or cleared by the [place] FDA and  has been authorized for detection and/or diagnosis of SARS-CoV-2 by FDA under an Emergency Use Authorization (EUA). This EUA will remain  in effect (meaning this test can be used) for the duration of the COVID-19 declaration under Section 56 4(b)(1) of the Act, 21 U.S.C. section 360bbb-3(b)(1), unless the authorization is terminated or revoked sooner. Performed at  [facility], [place] [address], [place], [place] [address]          Radiology Studies: No results found.      Scheduled Meds: . atorvastatin  20 mg Oral QODAY  . levothyroxine  75 mcg Oral Q0600  . pantoprazole  40 mg Oral Daily  . sertraline  200 mg Oral QHS  . Tbo-filgastrim (GRANIX) SQ  480 mcg Subcutaneous  q1800   Continuous Infusions: . piperacillin-tazobactam (ZOSYN)  IV 3.375 g (10/18/19 0100)  . vancomycin       LOS: 1 day   Time spent= 35 mins    Kanya Potteiger Arsenio Loader, MD [facility]  If 7PM-7AM, please contact night-coverage  10/18/2019, 8:14 AM

## 2019-10-18 NOTE — Progress Notes (Signed)
Rapid Response called at 0001 for patient with new onset of 9/10 chest pain, nausea, and hypertension beginning shortly after injection of Granix. Upon entering room patient lying in bed, awake with her hand on her sternum. Patient A/O x4, talking and intermittently moaning in pain.   Pt reports sudden onset of substernal chest pain 9/10 shortly after Granix injection with the sensation of a "bubble" and nausea. Pt denies shortness of breath, radiation of pain to arms, back, neck, jaw, or diaphoresis.  She denies ever having this type of pain in the past. Vital signs: 100% O2 sats on 2L [place], BP 182/100 and HR 88. Respirations were unlabored and lung sounds clear. Heart rate and rhythm regular. Bowel sounds were active.   EKG reveals NSR with no signs of ST elevation. NP on call notified and orders for Zofran and Simethicone placed and given by bedside RN. Orders to retake blood pressure once the patient was less anxious were given.    Repeat BP improving to 177/89 with other VSS. Prior to leaving patient requesting to get up and walk. Encouraged bedside RN to call RR if the patients symptoms worsened or failed to improve with medication.

## 2019-10-18 NOTE — Progress Notes (Signed)
Called into patient's room, patient c/o of chest pain 9/10. Rapid called. BP 182/100. Sat 100% on room air. Patient says she feels nausea. EKG shows NSR. Baltazar Najjar called ordered zofran and simethicone stated it was more GI instead of cardiac related. Rapid and IV team at bedside. Will start platelet transfusion soon and re check BP after patient settled. Will continue to monitor.

## 2019-10-18 NOTE — Progress Notes (Signed)
OT Cancellation Note  Patient Details Name: Joanne Klein MRN: [id number] DOB: 17-May-1946   Cancelled Treatment:    Reason Eval/Treat Not Completed: Fatigue/lethargy limiting ability to participate   OT role explained.  Pt fatigued at this time. Will check on pt later this day or next day  Kari Baars, [place] [phone] Office- [phone], Thereasa Parkin 10/18/2019, 2:29 PM

## 2019-10-18 NOTE — Progress Notes (Signed)
These preliminary result these preliminary results were noted.  Awaiting final report.

## 2019-10-18 NOTE — Progress Notes (Addendum)
HEMATOLOGY-ONCOLOGY PROGRESS NOTE  SUBJECTIVE: Admitted from [facility] due to fatigue, mouth pain, and pancytopenia.  She had some epistaxis overnight.  Reports that this is now resolved.  She is also having a small amount of bright red blood per rectum.  Denies abdominal pain, nausea, vomiting.  Reports having 14 loose stools yesterday, only one this morning. Having difficulty with IV access and multiple sticks for blood draws.  Has received 2 dose of platelets since admission.  Was started on Granix by admitting physician.  Currently receiving IV antibiotics.  Oncology History  Anal cancer ([place])  09/15/2019 Initial Diagnosis   Anal cancer ([place])   10/03/2019 -  Chemotherapy   The patient had mitoMYcin (MUTAMYCIN) chemo injection 18 mg, 8 mg/m2 = 18 mg (100 % of original dose 8 mg/m2), Intravenous,  Once, 1 of 1 cycle Dose modification: 8 mg/m2 (original dose 8 mg/m2, Cycle 1, Reason: Provider Judgment) Administration: 18 mg (10/03/2019) fluorouracil (ADRUCIL) 8,900 mg in sodium chloride 0.9 % 72 mL chemo infusion, 1,000 mg/m2/day = 8,900 mg, Intravenous, 4D (96 hours ), 1 of 1 cycle Administration: 8,900 mg (10/03/2019)  for chemotherapy treatment.       REVIEW OF SYSTEMS:   Constitutional: Reports fatigue, denies fevers, chills Respiratory: Denies cough, dyspnea or wheezes Cardiovascular: Denies palpitation, chest discomfort Gastrointestinal: Reports diarrhea, denies nausea and vomiting Skin: Denies abnormal skin rashes Lymphatics: Denies new lymphadenopathy, bruises easily, has had some epistaxis and rectal bleeding Neurological:Denies numbness, tingling or new weaknesses Behavioral/Psych: Mood is stable, no new changes  Extremities: Reports intermittent lower extremity edema All other systems were reviewed with the patient and are negative.  I have reviewed the past medical history, past surgical history, social history and family history with the patient and they are unchanged  from previous note.   PHYSICAL EXAMINATION:  Vitals:   10/18/19 0451 10/18/19 0715  BP: 126/64 (!) 116/52  Pulse: 80 79  Resp: 18 18  Temp: 98.6 F (37 C) 99.6 F (37.6 C)  SpO2: 91% 92%   Filed Weights   10/18/19 0136  Weight: 231 lb 14.1 oz (105.2 kg)    Intake/Output from previous day: 10/26 0701 - 10/27 0700 In: 740 [P.O.:240; IV Piggyback:500] Out: -   GENERAL:alert, no distress and comfortable SKIN: Scattered ecchymoses on arms EYES: normal, Conjunctiva are pink and non-injected, sclera clear OROPHARYNX: No thrush or mucositis NECK: supple, thyroid normal size, non-tender, without nodularity LYMPH:  no palpable lymphadenopathy in the cervical, axillary or inguinal LUNGS: clear to auscultation and percussion with normal breathing effort HEART: regular rate & rhythm and no murmurs and no lower extremity edema ABDOMEN:abdomen soft, non-tender and normal bowel sounds Musculoskeletal:no cyanosis of digits and no clubbing  NEURO: alert & oriented x 3 with fluent speech, no focal motor/sensory deficits Rectum: External hemorrhoids, tender at the perineum, no significant skin breakdown, persistent tumor at the anal verge LABORATORY DATA:  I have reviewed the data as listed CMP Latest Ref Rng & Units 10/18/2019 10/17/2019 10/12/2019  Glucose 70 - 99 mg/dL 112(H) 92 102(H)  BUN 8 - 23 mg/dL 18 22 20   Creatinine 0.44 - 1.00 mg/dL 0.87 0.92 0.89  Sodium 135 - 145 mmol/L 140 138 139  Potassium 3.5 - 5.1 mmol/L 2.7(LL) 2.8(L) 3.6  Chloride 98 - 111 mmol/L 108 107 109  CO2 22 - 32 mmol/L 23 23 23   Calcium 8.9 - 10.3 mg/dL 7.4(L) 7.8(L) 8.1(L)  Total Protein 6.5 - 8.1 g/dL 5.6(L) 5.9(L) 6.6  Total Bilirubin 0.3 -  1.2 mg/dL 1.0 1.0 1.3(H)  Alkaline Phos 38 - 126 U/L 46 53 62  AST 15 - 41 U/L 9(L) 11(L) 14(L)  ALT 0 - 44 U/L 10 10 10     Lab Results  Component Value Date   WBC 0.1 (LL) 10/18/2019   HGB 8.3 (L) 10/18/2019   HCT 23.4 (L) 10/18/2019   MCV 87.0 10/18/2019    PLT 28 (LL) 10/18/2019   NEUTROABS 0.0 (LL) 10/17/2019    Dg Chest 2 View  Result Date: 10/12/2019 CLINICAL DATA:  Shortness of breath. Leg pain and swelling. Currently undergoing treatment for anal cancer. EXAM: CHEST - 2 VIEW COMPARISON:  05/26/2014 FINDINGS: The heart size and mediastinal contours are within normal limits. Both lungs are clear. The visualized skeletal structures are unremarkable. IMPRESSION: No active cardiopulmonary disease. Electronically Signed   By: Lorriane Shire M.D.   On: 10/12/2019 19:10   Ct Angio Chest Pe W And/or Wo Contrast  Result Date: 10/12/2019 CLINICAL DATA:  Pt c/o leg pain, swelling, and SOB x 3 days ago. Patient has pain when she's standing on legs. Pt has rectal cancer and is currently receiving treatment for it. ^159m OMNIPAQUE IOHEXOL 350 MG/ML SOLNShortness of breath shortness of breath h/o cancer with active treatment EXAM: CT ANGIOGRAPHY CHEST WITH CONTRAST TECHNIQUE: Multidetector CT imaging of the chest was performed using the standard protocol during bolus administration of intravenous contrast. Multiplanar CT image reconstructions and MIPs were obtained to evaluate the vascular anatomy. CONTRAST:  1080mOMNIPAQUE IOHEXOL 350 MG/ML SOLN COMPARISON:  09/14/2019 FINDINGS: Cardiovascular: No filling defects within the pulmonary arteries to suggest acute pulmonary embolism. No acute findings of the aorta or great vessels. No pericardial fluid. Mediastinum/Nodes: No axillary supraclavicular adenopathy. No mediastinal hilar adenopathy no pericardial effusion. Esophagus normal Lungs/Pleura: No pulmonary infarction. Pneumonia. Centrilobular emphysema the upper lobes. No bronchiectasis. No suspicious nodules. Limited view of the liver, kidneys, pancreas are unremarkable. Normal adrenal glands. Upper Abdomen: Limited view of the liver, kidneys, pancreas are unremarkable. Normal adrenal glands. Musculoskeletal: No aggressive osseous lesion. Review of the MIP  images confirms the above findings. IMPRESSION: 1. No evidence acute pulmonary embolism. 2. No acute pulmonary parenchymal findings. Aortic Atherosclerosis (ICD10-I70.0) and Emphysema (ICD10-J43.9). Electronically Signed   By: StSuzy Bouchard.D.   On: 10/12/2019 20:59   Nm Pet Image Initial (pi) Skull Base To Thigh  Result Date: 09/27/2019 CLINICAL DATA:  Initial treatment strategy for anal squamous cell carcinoma. EXAM: NUCLEAR MEDICINE PET SKULL BASE TO THIGH TECHNIQUE: 12.2 mCi F-18 FDG was injected intravenously. Full-ring PET imaging was performed from the skull base to thigh after the radiotracer. CT data was obtained and used for attenuation correction and anatomic localization. Fasting blood glucose: 100 mg/dl COMPARISON:  09/14/2019 CT scan FINDINGS: Mediastinal blood pool activity: SUV max 2.9 Liver activity: SUV max 4.3 NECK: 1.0 by 0.8 cm left thyroid nodule on image 43/4, with low-grade metabolic activity with maximum SUV 4.1. Incidental CT findings: none CHEST: No significant abnormal hypermetabolic activity in this region. Incidental CT findings: Coronary, aortic arch, and branch vessel atherosclerotic vascular disease. Centrilobular emphysema. ABDOMEN/PELVIS: High-grade activity superficially along the anus with soft tissue prominence in this vicinity, maximum SUV 32.6. Three hypermetabolic left inguinal lymph nodes are present and suspicious for malignant involvement. The more lateral of these measures 1.6 cm in short axis on image 181/4 with maximum SUV 15.2. A left external iliac lymph node measures only 0.7 cm in short axis on image 175/4 with maximum SUV 3.3, only  very minimally above blood pool. There is some scattered accentuated activity in the cecum, maximum SUV 8.8, without CT correlate, likely physiologic. No hypermetabolic hepatic lesion is identified. Incidental CT findings: Cholecystectomy. Old granulomatous disease in the spleen. Aortoiliac atherosclerotic vascular disease.  Descending and sigmoid colon diverticulosis. SKELETON: No significant abnormal hypermetabolic activity in this region. Incidental CT findings: Unremarkable IMPRESSION: 1. The anal mass is highly hypermetabolic with a maximum SUV of 32.6. There are 3 hypermetabolic left inguinal lymph nodes compatible with malignant involvement. A left external iliac node with minimally higher than blood pool activity measuring 0.7 cm in short axis is not entirely specific for malignant involvement, and could be incidental. 2. 1.0 by 0.8 cm left thyroid nodule has activity mildly above background blood pool activity but below liver activity. This is not a definitive hot nodule but is relatively small at 1.0 by 0.8 cm, and has some marginal calcifications. I would suggest thyroid ultrasound for further characterization. 3. No perceptible hypermetabolic activity in the vicinity of the lateral right hepatic lobe lesion seen on the 09/14/2019 CT. Surveillance of the liver may be warranted but this appearance on PET-CT is reassuring that this is likely not a metastatic lesion. 4. Other imaging findings of potential clinical significance: Aortic Atherosclerosis (ICD10-I70.0). Coronary atherosclerosis. Emphysema (ICD10-J43.9). Descending and sigmoid colon diverticulosis. Electronically Signed   By: Van Clines M.D.   On: 09/27/2019 17:19   Vas [place] Lower Extremity Venous (dvt) (only Mc & Wl 7a-7p)  Result Date: 10/13/2019  Lower Venous Study Indications: Swelling, and Edema.  Comparison Study: no prior Performing Technologist: Abram Sander RVS  Examination Guidelines: A complete evaluation includes B-mode imaging, spectral Doppler, color Doppler, and power Doppler as needed of all accessible portions of each vessel. Bilateral testing is considered an integral part of a complete examination. Limited examinations for reoccurring indications may be performed as noted.   +---------+---------------+---------+-----------+----------+--------------+ RIGHT    CompressibilityPhasicitySpontaneityPropertiesThrombus Aging +---------+---------------+---------+-----------+----------+--------------+ CFV      Full           Yes      Yes                                 +---------+---------------+---------+-----------+----------+--------------+ SFJ      Full                                                        +---------+---------------+---------+-----------+----------+--------------+ FV Prox  Full                                                        +---------+---------------+---------+-----------+----------+--------------+ FV Mid   Full                                                        +---------+---------------+---------+-----------+----------+--------------+ FV DistalFull                                                        +---------+---------------+---------+-----------+----------+--------------+  PFV      Full                                                        +---------+---------------+---------+-----------+----------+--------------+ POP      Full           Yes      Yes                                 +---------+---------------+---------+-----------+----------+--------------+ PTV      Full                                                        +---------+---------------+---------+-----------+----------+--------------+ PERO                                                  Not visualized +---------+---------------+---------+-----------+----------+--------------+   +---------+---------------+---------+-----------+----------+--------------+ LEFT     CompressibilityPhasicitySpontaneityPropertiesThrombus Aging +---------+---------------+---------+-----------+----------+--------------+ CFV      Full           Yes      Yes                                  +---------+---------------+---------+-----------+----------+--------------+ SFJ      Full                                                        +---------+---------------+---------+-----------+----------+--------------+ FV Prox  Full                                                        +---------+---------------+---------+-----------+----------+--------------+ FV Mid   Full                                                        +---------+---------------+---------+-----------+----------+--------------+ FV DistalFull                                                        +---------+---------------+---------+-----------+----------+--------------+ PFV      Full                                                        +---------+---------------+---------+-----------+----------+--------------+  POP      Full           Yes      Yes                                 +---------+---------------+---------+-----------+----------+--------------+ PTV      Full                                                        +---------+---------------+---------+-----------+----------+--------------+ PERO                                                  Not visualized +---------+---------------+---------+-----------+----------+--------------+     *See table(s) above for measurements and observations. Electronically signed by Servando Snare MD on 10/13/2019 at 2:16:15 PM.    Final    Ir Picc Placement Right >5 Yrs Inc Img Guide  Result Date: 10/03/2019 INDICATION: History of anal cancer. In need intravenous access for the initiation of chemotherapy. EXAM: ULTRASOUND AND FLUOROSCOPIC GUIDED PICC LINE INSERTION MEDICATIONS: None. CONTRAST:  None FLUOROSCOPY TIME:  1 minute, 18 seconds (6.6 mGy) COMPLICATIONS: None immediate. TECHNIQUE: The procedure, risks, benefits, and alternatives were explained to the patient and informed written consent was obtained. A timeout was performed prior to  the initiation of the procedure. The right upper extremity was prepped with chlorhexidine in a sterile fashion, and a sterile drape was applied covering the operative field. Maximum barrier sterile technique with sterile gowns and gloves were used for the procedure. A timeout was performed prior to the initiation of the procedure. Local anesthesia was provided with 1% lidocaine. Under direct ultrasound guidance, the basilic vein was accessed with a micropuncture kit after the overlying soft tissues were anesthetized with 1% lidocaine. After the overlying soft tissues were anesthetized, a small venotomy incision was created and a micropuncture kit was utilized to access the right basilic vein. Real-time ultrasound guidance was utilized for vascular access including the acquisition of a permanent ultrasound image documenting patency of the accessed vessel. A guidewire was advanced to the level of the superior caval-atrial junction for measurement purposes and the PICC line was cut to length. A peel-away sheath was placed and a 41 cm, 5 [place], single lumen was inserted to level of the superior caval-atrial junction. A post procedure spot fluoroscopic was obtained. The catheter easily aspirated and flushed and was sutured in place. A dressing was placed. The patient tolerated the procedure well without immediate post procedural complication. FINDINGS: After catheter placement, the tip lies within the superior cavoatrial junction. The catheter aspirates and flushes normally and is ready for immediate use. IMPRESSION: Successful ultrasound and fluoroscopic guided placement of a right basilic vein approach, 41 cm, 5 [place], single lumen PICC with tip at the superior caval-atrial junction. The PICC line is ready for immediate use. Electronically Signed   By: Sandi Mariscal M.D.   On: 10/03/2019 10:19   [place] Ekg Site Rite  Result Date: 10/18/2019 If Site Rite image not attached, placement could not be confirmed due to  current cardiac rhythm.   ASSESSMENT AND PLAN: 1. 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  Biopsy 08/30/2019 confirmed well-differentiated squamous cell carcinoma with positive P 16 and p63 stains ? CTs 09/14/2019-abnormal soft tissue fullness at the lower anus/perianal soft tissues, asymmetric left inguinal lymphadenopathy, borderline enlarged left external iliac node, mild stranding and cutaneous thickening of the left gluteal fold, 2 to 3 mm pulmonary nodules, 1.6 cm right hepatic lesion-likely a hemangioma ? Began concurrent chemoRT with 5FU/mitomycin on 10/03/2019 2. COPD 3. Fibromyalgia 4. Chronic back pain 5. Hypothyroid 6. Depression 7. History of uterine cancer-age 12 8. Pain secondary to #1 9. Orthostatic hypotension, fatigue, dyspnea on exertion, early mucositis requiring supportive care on day 9, secondary to chemotherapy  10. Thrombocytopenia PLT 58K and neutropenia ANC 1.4 on day 9, secondary to chemotherapy 11. Worsening cytopenias, PLT 8K and ANC 0.0 on day 15, secondary to chemotherapy. Started prophylaxis with cipro on 10/22 day 11 12.  Hospital admission 10/17/2019 -fatigue, pancytopenia, hypokalemia  Ms. Solomon appears stable.  Has received 2 units of platelets overnight and was started on Granix by admitting physician.  Repeat CBC from this morning is currently pending.  The patient has hypokalemia likely due to her diarrhea.  Calcium was 2.8 on admission.  Repeat potassium from this morning is pending.  Magnesium level is also pending.  Continues to have diarrhea, but less than yesterday.  Rectal bleeding is stable.  Recommendations: 1.  We will request PICC line placement by IV team due to transfusions and frequent blood draws. 2.  Will order sitz bath twice daily and barrier cream to be applied twice a day. 3.  We will review repeat lab work once available.  Recommend platelet transfusion if platelets are less than 10,000 or active bleeding.  Follow CBC with  differential daily. 4.  Will determine if Granix needs to be continued pending results of today CBC. 5.  Replete potassium per hospitalist.   LOS: 1 day   Betsy Coder, [place], AGPCNP-BC, AOCNP 10/18/19 Ms. Wollschlager was interviewed and examined.  She appears stable compared to when I saw her yesterday.  She has persistent severe pancytopenia.  She has received platelet transfusions and the platelet count has moved.  No active bleeding at present.  She will begin sitz bath's for the rectal pain and continue oxycodone as needed.  She will continue Levaquin prophylaxis and has started G-CSF.  I recommend broad-spectrum intravenous antibiotics if she has a fever.  We will request PICC placement today.

## 2019-10-18 NOTE — Progress Notes (Signed)
2nd unit of platelets still infusing. Delay of transfusion due to rapid response call and lost of IV access. CBC lad draw postponed to 830 due to platelets still infusing per lab rep. Will pass on to day RN. Patient having active nose bleed. On call notified. Will continue to monitor.

## 2019-10-18 NOTE — Progress Notes (Signed)
Ms. Muldrow is being admitted to [facility] today. COVID-19 testing was collected and returned negative.  Sandi Mealy, MHS, PA-C Physician Assistant

## 2019-10-18 NOTE — Evaluation (Signed)
Physical Therapy Evaluation Patient Details Name: Joanne Klein MRN: [id number] DOB: 08-31-1946 Today's Date: 10/18/2019   History of Present Illness  73 yo female admitted with faigue, rectal pain, pancytopenia, thrombocytopenia. Hx of anal ca-on chemo, fibromyalgia, COPD, uterine ca, chronic back pain  Clinical Impression  On eval, pt was Min guard assist for mobility. She walked ~15 feet x 2 while holding on to the IV pole. Pt fatigues easily/quickly with minimal activity. Will plan to follow and progress activity as tolerated. At this time, recommendation is for HHPT as long as pt progresses well during hospital stay.     Follow Up Recommendations Home health PT;Supervision - Intermittent    Equipment Recommendations  None recommended by PT    Recommendations for Other Services OT consult     Precautions / Restrictions Precautions Precautions: Fall Restrictions Weight Bearing Restrictions: No      Mobility  Bed Mobility   Bed Mobility: Supine to Sit;Sit to Supine;Rolling Rolling: Modified independent (Device/Increase time)   Supine to sit: Modified independent (Device/Increase time) Sit to supine: Modified independent (Device/Increase time)      Transfers Overall transfer level: Needs assistance   Transfers: Sit to/from Stand Sit to Stand: Supervision         General transfer comment: for safety.  Ambulation/Gait Ambulation/Gait assistance: Min guard Gait Distance (Feet): 15 Feet(x2) Assistive device: IV Pole Gait Pattern/deviations: Step-through pattern;Decreased stride length     General Gait Details: close guard for safety. pt fatigues easily. dyspnea 2/4.  Stairs            Wheelchair Mobility    Modified Rankin (Stroke Patients Only)       Balance Overall balance assessment: Needs assistance           Standing balance-Leahy Scale: Fair                               Pertinent Vitals/Pain Pain Assessment:  Faces Faces Pain Scale: Hurts even more Pain Location: rectum when using bathroom/sitting Pain Descriptors / Indicators: Sore;Discomfort Pain Intervention(s): Monitored during session;Repositioned    Home Living Family/patient expects to be discharged to:: Private residence Living Arrangements: Alone Available Help at Discharge: Family;Available PRN/intermittently Type of Home: House Home Access: Stairs to enter   Entrance Stairs-Number of Steps: 3 Home Layout: One level Home Equipment: Walker - 2 wheels;Cane - single point      Prior Function Level of Independence: Independent               Hand Dominance        Extremity/Trunk Assessment   Upper Extremity Assessment Upper Extremity Assessment: Defer to OT evaluation    Lower Extremity Assessment Lower Extremity Assessment: Generalized weakness    Cervical / Trunk Assessment Cervical / Trunk Assessment: Normal  Communication   Communication: No difficulties  Cognition Arousal/Alertness: Awake/alert Behavior During Therapy: WFL for tasks assessed/performed Overall Cognitive Status: Within Functional Limits for tasks assessed                                        General Comments      Exercises     Assessment/Plan    PT Assessment Patient needs continued PT services  PT Problem List Decreased strength;Decreased mobility;Decreased balance;Pain;Decreased activity tolerance       PT Treatment Interventions DME instruction;Gait training;Therapeutic exercise;Therapeutic activities;Patient/family  education;Balance training;Functional mobility training    PT Goals (Current goals can be found in the Care Plan section)  Acute Rehab PT Goals Patient Stated Goal: to get better PT Goal Formulation: With patient Time For Goal Achievement: 11/01/19 Potential to Achieve Goals: Good    Frequency Min 3X/week   Barriers to discharge        Co-evaluation               AM-PAC PT "6  Clicks" Mobility  Outcome Measure Help needed turning from your back to your side while in a flat bed without using bedrails?: None Help needed moving from lying on your back to sitting on the side of a flat bed without using bedrails?: None Help needed moving to and from a bed to a chair (including a wheelchair)?: A Little Help needed standing up from a chair using your arms (e.g., wheelchair or bedside chair)?: A Little Help needed to walk in hospital room?: A Little Help needed climbing 3-5 steps with a railing? : A Little 6 Click Score: 20    End of Session   Activity Tolerance: Patient limited by fatigue Patient left: in bed;with call bell/phone within reach;with bed alarm set   PT Visit Diagnosis: Unsteadiness on feet (R26.81);Muscle weakness (generalized) (M62.81);Difficulty in walking, not elsewhere classified (R26.2)    Time: [id number] PT Time Calculation (min) (ACUTE ONLY): 23 min   Charges:   PT Evaluation $PT Eval Moderate Complexity: 1 Mod PT Treatments $Gait Training: 8-22 mins          Weston Anna, PT Acute Rehabilitation Services Pager: [phone] Office: [phone]

## 2019-10-18 NOTE — Progress Notes (Signed)
Attempted x2 ,inserting PICC using Left basilic  and cephalic vein easily access but guidewire will not pass axilla .Right basilic vein is non compressible,R  basilic  is small to measure. Previous PICC inserted by IR.Primary RN is aware.

## 2019-10-19 ENCOUNTER — Ambulatory Visit: Payer: Medicare Other

## 2019-10-19 DIAGNOSIS — D61810 Antineoplastic chemotherapy induced pancytopenia: Secondary | ICD-10-CM | POA: Diagnosis not present

## 2019-10-19 DIAGNOSIS — C210 Malignant neoplasm of anus, unspecified: Secondary | ICD-10-CM | POA: Diagnosis not present

## 2019-10-19 DIAGNOSIS — T451X5A Adverse effect of antineoplastic and immunosuppressive drugs, initial encounter: Secondary | ICD-10-CM | POA: Diagnosis not present

## 2019-10-19 DIAGNOSIS — E876 Hypokalemia: Secondary | ICD-10-CM | POA: Diagnosis not present

## 2019-10-19 LAB — PREPARE PLATELET PHERESIS
Unit division: 0
Unit division: 0

## 2019-10-19 LAB — BPAM PLATELET PHERESIS
Blood Product Expiration Date: 202010292359
Blood Product Expiration Date: 202010292359
ISSUE DATE / TIME: 202010270118
ISSUE DATE / TIME: 202010270430
Unit Type and Rh: 6200
Unit Type and Rh: 6200

## 2019-10-19 LAB — COMPREHENSIVE METABOLIC PANEL
ALT: 9 U/L (ref 0–44)
AST: 9 U/L — ABNORMAL LOW (ref 15–41)
Albumin: 2.5 g/dL — ABNORMAL LOW (ref 3.5–5.0)
Alkaline Phosphatase: 44 U/L (ref 38–126)
Anion gap: 5 (ref 5–15)
BUN: 17 mg/dL (ref 8–23)
CO2: 25 mmol/L (ref 22–32)
Calcium: 7.8 mg/dL — ABNORMAL LOW (ref 8.9–10.3)
Chloride: 112 mmol/L — ABNORMAL HIGH (ref 98–111)
Creatinine, Ser: 0.74 mg/dL (ref 0.44–1.00)
GFR calc Af Amer: >60 mL/min (ref >60)
GFR calc non Af Amer: >60 mL/min (ref >60)
Glucose, Bld: 118 mg/dL — ABNORMAL HIGH (ref 70–99)
Potassium: 4.4 mmol/L (ref 3.5–5.1)
Sodium: 142 mmol/L (ref 135–145)
Total Bilirubin: 0.7 mg/dL (ref 0.3–1.2)
Total Protein: 5.2 g/dL — ABNORMAL LOW (ref 6.5–8.1)

## 2019-10-19 LAB — CBC WITH DIFFERENTIAL/PLATELET
HCT: 23.8 % — ABNORMAL LOW (ref 36.0–46.0)
Hemoglobin: 8.2 g/dL — ABNORMAL LOW (ref 12.0–15.0)
MCH: 30.7 pg (ref 26.0–34.0)
MCHC: 34.5 g/dL (ref 30.0–36.0)
MCV: 89.1 fL (ref 80.0–100.0)
Platelets: 20 10*3/uL — CL (ref 150–400)
RBC: 2.67 MIL/uL — ABNORMAL LOW (ref 3.87–5.11)
RDW: 11.9 % (ref 11.5–15.5)
WBC: 0.2 10*3/uL — CL (ref 4.0–10.5)
nRBC: 0 % (ref 0.0–0.2)

## 2019-10-19 LAB — CBC
HCT: 22.4 % — ABNORMAL LOW (ref 36.0–46.0)
Hemoglobin: 7.9 g/dL — ABNORMAL LOW (ref 12.0–15.0)
MCH: 31.2 pg (ref 26.0–34.0)
MCHC: 35.3 g/dL (ref 30.0–36.0)
MCV: 88.5 fL (ref 80.0–100.0)
Platelets: 22 10*3/uL — CL (ref 150–400)
RBC: 2.53 MIL/uL — ABNORMAL LOW (ref 3.87–5.11)
RDW: 11.9 % (ref 11.5–15.5)
WBC: 0.2 10*3/uL — CL (ref 4.0–10.5)
nRBC: 0 % (ref 0.0–0.2)

## 2019-10-19 LAB — MAGNESIUM: Magnesium: 1.7 mg/dL (ref 1.7–2.4)

## 2019-10-19 MED ORDER — HYDROMORPHONE HCL 1 MG/ML IJ SOLN
0.5000 mg | INTRAMUSCULAR | Status: DC | PRN
  Administered 2019-10-19 – 2019-10-26 (×17): 1 mg via INTRAVENOUS
  Filled 2019-10-19 (×19): qty 1

## 2019-10-19 MED ORDER — MENTHOL 3 MG MT LOZG
1.0000 | LOZENGE | OROMUCOSAL | Status: DC | PRN
  Administered 2019-10-19: 3 mg via ORAL
  Filled 2019-10-19 (×2): qty 9

## 2019-10-19 MED ORDER — LIP MEDEX EX OINT
TOPICAL_OINTMENT | CUTANEOUS | Status: DC | PRN
  Administered 2019-10-19: 14:00:00 via TOPICAL
  Filled 2019-10-19: qty 7

## 2019-10-19 NOTE — Progress Notes (Signed)
PROGRESS NOTE    Joanne Klein  [id number] DOB: 02-Jun-1946 DOA: 10/17/2019 PCP: Hoyt Koch, MD   Brief Narrative:  73 year old with history of squamous cell carcinoma of anal margin, COPD, uterine cancer, GERD, hyperlipidemia, obstructive sleep apnea, IBS, hypothyroidism, fibromyalgia, chronic lower back pain presented to the hospital with complains of fatigue, mouth pain secondary to mucositis and spontaneous rectal bleeding after first chemo treatment.  Developed severe pancytopenia/neutropenia.  On outpatient chemo and radiation.  Receiving blood transfusion and platelets transfusion.  Empirically patient was started on Levaquin.   Assessment & Plan:   Active Problems:   Depression   Hypothyroidism   GERD (gastroesophageal reflux disease)   OCD (obsessive compulsive disorder)   COPD GOLD II    Hyperlipidemia   Anal cancer (HCC)   Thrombocytopenia (HCC)  Severe neutropenia/pancytopenia Epistaxis/rectal bleeding, secondary to thrombocytopenia -Status post chemotherapy.  Status post 2 unit PRBC transfusion.  Monitor platelets and hemoglobin.  If necessary transfer. -Prophylactic continue Levaquin.  Broaden coverage if needed -PICC line -IV Granix -Sitz bath.  Mucositis -Magic mouthwash with lidocaine.  Nystatin  Left lower extremity swelling -Recent Dopplers and CTA are negative.  Hypokalemia -Aggressive repletion  Atypical chest pain, resolved  History of anal cancer -Currently ongoing radiation and chemotherapy.  On hold  Hypothyroidism -Continue Synthroid. -TSH 2.9  Depression -Continue home meds  Moderate to severe protein calorie malnutrition -Nutrition consulted.  Prealbumin 10.7.  GERD -PPI  History of chronic COPD -Currently stable.  As needed bronchodilators  Hyperlipidemia -Statin  PT recommends-home PT  DVT prophylaxis: SCDs Code Status: Limited code, okay with intubation Family Communication: None Disposition Plan:  Maintain hospital stay until cleared by oncology.  Consultants:   Oncology  Procedures:   PICC placement by IR 10/27  Antimicrobials:   Empiric Levaquin   Subjective: Sleepy but no complaints.  Does not appear to have any overnight rectal bleeding.  Review of Systems Otherwise negative except as per HPI, including: General = no fevers, chills, dizziness, malaise, fatigue HEENT/EYES = negative for pain, redness, loss of vision, double vision, blurred vision, loss of hearing, sore throat, hoarseness, dysphagia Cardiovascular= negative for chest pain, palpitation, murmurs, lower extremity swelling Respiratory/lungs= negative for shortness of breath, cough, hemoptysis, wheezing, mucus production Gastrointestinal= negative for nausea, vomiting,, abdominal pain, melena, hematemesis Genitourinary= negative for Dysuria, Hematuria, Change in Urinary Frequency MSK = Negative for arthralgia, myalgias, Back Pain, Joint swelling  Neurology= Negative for headache, seizures, numbness, tingling  Psychiatry= Negative for anxiety, depression, suicidal and homocidal ideation Allergy/Immunology= Medication/Food allergy as listed  Skin= Negative for Rash, lesions, ulcers, itching   Objective: Vitals:   10/18/19 0451 10/18/19 0715 10/18/19 1440 10/18/19 1900  BP: 126/64 (!) 116/52 118/79 120/79  Pulse: 80 79 89 89  Resp: 18 18 20 18   Temp: 98.6 F (37 C) 99.6 F (37.6 C) 98.2 F (36.8 C) 98.2 F (36.8 C)  TempSrc: Oral Oral Oral Oral  SpO2: 91% 92% 94% 96%  Weight:      Height:        Intake/Output Summary (Last 24 hours) at 10/19/2019 1140 Last data filed at 10/19/2019 1119 Gross per 24 hour  Intake 20 ml  Output -  Net 20 ml   Filed Weights   10/18/19 0136  Weight: 105.2 kg    Examination: Constitutional: NAD, calm, comfortable Eyes: PERRL, lids and conjunctivae normal ENMT: Mucous membranes are moist. Posterior pharynx clear of any exudate or lesions.Normal dentition.   Neck: normal, supple, no masses, no  thyromegaly Respiratory: clear to auscultation bilaterally, no wheezing, no crackles. Normal respiratory effort. No accessory muscle use.  Cardiovascular: Regular rate and rhythm, no murmurs / rubs / gallops. No extremity edema. 2+ pedal pulses. No carotid bruits.  Abdomen: no tenderness, no masses palpated. No hepatosplenomegaly. Bowel sounds positive.  Musculoskeletal: no clubbing / cyanosis. No joint deformity upper and lower extremities. Good ROM, no contractures. Normal muscle tone.  Skin: no rashes, lesions, ulcers. No induration Neurologic: CN 2-12 grossly intact. Sensation intact, DTR normal. Strength 5/5 in all 4.  Psychiatric: Normal judgment and insight. Alert and oriented x 3. Normal mood.   Data Reviewed:   CBC: Recent Labs  Lab 10/12/19 1827  10/13/19 1443 10/14/19 1259 10/17/19 1436 10/17/19 1945 10/18/19 0906 10/19/19 0339  WBC 1.4*   < > 0.7* 0.5* 0.2* 0.2* 0.1* 0.2*  NEUTROABS 0.6*  --  0.3* 0.0* 0.0*  --   --   --   HGB 12.6   < > 11.5* 11.3* 9.7* 9.2* 8.3* 7.9*  HCT 36.8  --  32.8* 31.0* 27.1* 26.1* 23.4* 22.4*  MCV 88.9  --  86.5 85.4 85.5 87.6 87.0 88.5  PLT 46*   < > 31* 18* 8* 7* 28* 22*   < > = values in this interval not displayed.   Basic Metabolic Panel: Recent Labs  Lab 10/12/19 1827 10/17/19 1945 10/18/19 0906 10/18/19 1441 10/19/19 0339  NA 139 138 140 140 142  K 3.6 2.8* 2.7* 2.9* 4.4  CL 109 107 108 109 112*  CO2 23 23 23 24 25   GLUCOSE 102* 92 112* 138* 118*  BUN 20 22 18 18 17   CREATININE 0.89 0.92 0.87 0.77 0.74  CALCIUM 8.1* 7.8* 7.4* 7.4* 7.8*  MG  --   --  1.8  --  1.7  PHOS  --   --  2.7  --   --    GFR: Estimated Creatinine Clearance: 81.9 mL/min (by C-G formula based on SCr of 0.74 mg/dL). Liver Function Tests: Recent Labs  Lab 10/12/19 1827 10/17/19 1945 10/18/19 0906 10/19/19 0339  AST 14* 11* 9* 9*  ALT 10 10 10 9   ALKPHOS 62 53 46 44  BILITOT 1.3* 1.0 1.0 0.7  PROT 6.6 5.9*  5.6* 5.2*  ALBUMIN 3.5 3.0* 2.8* 2.5*   No results for input(s): LIPASE, AMYLASE in the last 168 hours. No results for input(s): AMMONIA in the last 168 hours. Coagulation Profile: Recent Labs  Lab 10/17/19 1945  INR 1.3*   Cardiac Enzymes: No results for input(s): CKTOTAL, CKMB, CKMBINDEX, TROPONINI in the last 168 hours. BNP (last 3 results) No results for input(s): PROBNP in the last 8760 hours. HbA1C: No results for input(s): HGBA1C in the last 72 hours. CBG: No results for input(s): GLUCAP in the last 168 hours. Lipid Profile: No results for input(s): CHOL, HDL, LDLCALC, TRIG, CHOLHDL, LDLDIRECT in the last 72 hours. Thyroid Function Tests: Recent Labs    10/18/19 0906  TSH 2.971   Anemia Panel: No results for input(s): VITAMINB12, FOLATE, FERRITIN, TIBC, IRON, RETICCTPCT in the last 72 hours. Sepsis Labs: No results for input(s): PROCALCITON, LATICACIDVEN in the last 168 hours.  Recent Results (from the past 240 hour(s))  SARS CORONAVIRUS 2 (TAT 6-24 HRS) Nasopharyngeal Nasopharyngeal Swab     Status: None   Collection Time: 10/17/19  4:16 PM   Specimen: Nasopharyngeal Swab  Result Value Ref Range Status   SARS Coronavirus 2 NEGATIVE NEGATIVE Final    Comment: (NOTE)  SARS-CoV-2 target nucleic acids are NOT DETECTED. The SARS-CoV-2 RNA is generally detectable in upper and lower respiratory specimens during the acute phase of infection. Negative results do not preclude SARS-CoV-2 infection, do not rule out co-infections with other pathogens, and should not be used as the sole basis for treatment or other patient management decisions. Negative results must be combined with clinical observations, patient history, and epidemiological information. The expected result is Negative. Fact Sheet for Patients: SugarRoll.be Fact Sheet for Healthcare Providers: https://www.woods-mathews.com/ This test is not yet approved or cleared  by the [place] FDA and  has been authorized for detection and/or diagnosis of SARS-CoV-2 by FDA under an Emergency Use Authorization (EUA). This EUA will remain  in effect (meaning this test can be used) for the duration of the COVID-19 declaration under Section 56 4(b)(1) of the Act, 21 U.S.C. section 360bbb-3(b)(1), unless the authorization is terminated or revoked sooner. Performed at [facility], [place] [address], [place], [place] [address]          Radiology Studies: [place] Ekg Site Rite  Result Date: 10/18/2019 If [facility] image not attached, placement could not be confirmed due to current cardiac rhythm.       Scheduled Meds: . atorvastatin  20 mg Oral QODAY  . barrier cream  1 application Topical BID  . Chlorhexidine Gluconate Cloth  6 each Topical Daily  . feeding supplement (ENSURE ENLIVE)  237 mL Oral BID BM  . levofloxacin  500 mg Oral q1800  . levothyroxine  75 mcg Oral Q0600  . pantoprazole  40 mg Oral Daily  . sertraline  200 mg Oral QHS  . sodium chloride flush  10-40 mL Intracatheter Q12H  . Tbo-filgastrim (GRANIX) SQ  480 mcg Subcutaneous q1800   Continuous Infusions:    LOS: 2 days   Time spent= 35 mins    Abena Erdman Arsenio Loader, MD [facility]  If 7PM-7AM, please contact night-coverage  10/19/2019, 11:40 AM

## 2019-10-19 NOTE — Progress Notes (Addendum)
HEMATOLOGY-ONCOLOGY PROGRESS NOTE  SUBJECTIVE: Very tired this morning.  States she had a rough night, but did not elaborate any further.  Reports that she only had about 3-4 loose stools yesterday.  Denies bleeding.  She states that she has been having some increased pain to her rectum.  Did not really want to answer any further questions this morning.  Oncology History  Anal cancer ([place])  09/15/2019 Initial Diagnosis   Anal cancer ([place])   10/03/2019 -  Chemotherapy   The patient had mitoMYcin (MUTAMYCIN) chemo injection 18 mg, 8 mg/m2 = 18 mg (100 % of original dose 8 mg/m2), Intravenous,  Once, 1 of 1 cycle Dose modification: 8 mg/m2 (original dose 8 mg/m2, Cycle 1, Reason: Provider Judgment) Administration: 18 mg (10/03/2019) fluorouracil (ADRUCIL) 8,900 mg in sodium chloride 0.9 % 72 mL chemo infusion, 1,000 mg/m2/day = 8,900 mg, Intravenous, 4D (96 hours ), 1 of 1 cycle Administration: 8,900 mg (10/03/2019)  for chemotherapy treatment.       REVIEW OF SYSTEMS:   As noted in the HPI.  I have reviewed the past medical history, past surgical history, social history and family history with the patient and they are unchanged from previous note.   PHYSICAL EXAMINATION:  Vitals:   10/18/19 1440 10/18/19 1900  BP: 118/79 120/79  Pulse: 89 89  Resp: 20 18  Temp: 98.2 F (36.8 C) 98.2 F (36.8 C)  SpO2: 94% 96%   Filed Weights   10/18/19 0136  Weight: 231 lb 14.1 oz (105.2 kg)    Intake/Output from previous day: 10/27 0701 - 10/28 0700 In: 236 [P.O.:236] Out: -   GENERAL: Resting quietly, no distress and comfortable SKIN: Scattered ecchymoses on arms OROPHARYNX: No thrush or mucositis NECK: supple, thyroid normal size, non-tender, without nodularity LYMPH:  no palpable lymphadenopathy in the cervical, axillary or inguinal LUNGS: clear to auscultation and percussion with normal breathing effort HEART: regular rate & rhythm and no murmurs and no lower extremity  edema ABDOMEN: abdomen soft, non-tender and normal bowel sounds Musculoskeletal:no cyanosis of digits and no clubbing  NEURO: alert & oriented x 3 with fluent speech, no focal motor/sensory deficits Rectum: External hemorrhoids, tender at the perineum, no significant skin breakdown, persistent tumor at the anal verge  LABORATORY DATA:  I have reviewed the data as listed CMP Latest Ref Rng & Units 10/19/2019 10/18/2019 10/18/2019  Glucose 70 - 99 mg/dL 118(H) 138(H) 112(H)  BUN 8 - 23 mg/dL '17 18 18  '$ Creatinine 0.44 - 1.00 mg/dL 0.74 0.77 0.87  Sodium 135 - 145 mmol/L 142 140 140  Potassium 3.5 - 5.1 mmol/L 4.4 2.9(L) 2.7(LL)  Chloride 98 - 111 mmol/L 112(H) 109 108  CO2 22 - 32 mmol/L '25 24 23  '$ Calcium 8.9 - 10.3 mg/dL 7.8(L) 7.4(L) 7.4(L)  Total Protein 6.5 - 8.1 g/dL 5.2(L) - 5.6(L)  Total Bilirubin 0.3 - 1.2 mg/dL 0.7 - 1.0  Alkaline Phos 38 - 126 U/L 44 - 46  AST 15 - 41 U/L 9(L) - 9(L)  ALT 0 - 44 U/L 9 - 10    Lab Results  Component Value Date   WBC 0.2 (LL) 10/19/2019   HGB 7.9 (L) 10/19/2019   HCT 22.4 (L) 10/19/2019   MCV 88.5 10/19/2019   PLT 22 (LL) 10/19/2019   NEUTROABS 0.0 (LL) 10/17/2019    Dg Chest 2 View  Result Date: 10/12/2019 CLINICAL DATA:  Shortness of breath. Leg pain and swelling. Currently undergoing treatment for anal cancer. EXAM: CHEST -  2 VIEW COMPARISON:  05/26/2014 FINDINGS: The heart size and mediastinal contours are within normal limits. Both lungs are clear. The visualized skeletal structures are unremarkable. IMPRESSION: No active cardiopulmonary disease. Electronically Signed   By: Lorriane Shire M.D.   On: 10/12/2019 19:10   Ct Angio Chest Pe W And/or Wo Contrast  Result Date: 10/12/2019 CLINICAL DATA:  Pt c/o leg pain, swelling, and SOB x 3 days ago. Patient has pain when she's standing on legs. Pt has rectal cancer and is currently receiving treatment for it. ^169m OMNIPAQUE IOHEXOL 350 MG/ML SOLNShortness of breath shortness of breath  h/o cancer with active treatment EXAM: CT ANGIOGRAPHY CHEST WITH CONTRAST TECHNIQUE: Multidetector CT imaging of the chest was performed using the standard protocol during bolus administration of intravenous contrast. Multiplanar CT image reconstructions and MIPs were obtained to evaluate the vascular anatomy. CONTRAST:  1053mOMNIPAQUE IOHEXOL 350 MG/ML SOLN COMPARISON:  09/14/2019 FINDINGS: Cardiovascular: No filling defects within the pulmonary arteries to suggest acute pulmonary embolism. No acute findings of the aorta or great vessels. No pericardial fluid. Mediastinum/Nodes: No axillary supraclavicular adenopathy. No mediastinal hilar adenopathy no pericardial effusion. Esophagus normal Lungs/Pleura: No pulmonary infarction. Pneumonia. Centrilobular emphysema the upper lobes. No bronchiectasis. No suspicious nodules. Limited view of the liver, kidneys, pancreas are unremarkable. Normal adrenal glands. Upper Abdomen: Limited view of the liver, kidneys, pancreas are unremarkable. Normal adrenal glands. Musculoskeletal: No aggressive osseous lesion. Review of the MIP images confirms the above findings. IMPRESSION: 1. No evidence acute pulmonary embolism. 2. No acute pulmonary parenchymal findings. Aortic Atherosclerosis (ICD10-I70.0) and Emphysema (ICD10-J43.9). Electronically Signed   By: StSuzy Bouchard.D.   On: 10/12/2019 20:59   Nm Pet Image Initial (pi) Skull Base To Thigh  Result Date: 09/27/2019 CLINICAL DATA:  Initial treatment strategy for anal squamous cell carcinoma. EXAM: NUCLEAR MEDICINE PET SKULL BASE TO THIGH TECHNIQUE: 12.2 mCi F-18 FDG was injected intravenously. Full-ring PET imaging was performed from the skull base to thigh after the radiotracer. CT data was obtained and used for attenuation correction and anatomic localization. Fasting blood glucose: 100 mg/dl COMPARISON:  09/14/2019 CT scan FINDINGS: Mediastinal blood pool activity: SUV max 2.9 Liver activity: SUV max 4.3 NECK: 1.0 by  0.8 cm left thyroid nodule on image 43/4, with low-grade metabolic activity with maximum SUV 4.1. Incidental CT findings: none CHEST: No significant abnormal hypermetabolic activity in this region. Incidental CT findings: Coronary, aortic arch, and branch vessel atherosclerotic vascular disease. Centrilobular emphysema. ABDOMEN/PELVIS: High-grade activity superficially along the anus with soft tissue prominence in this vicinity, maximum SUV 32.6. Three hypermetabolic left inguinal lymph nodes are present and suspicious for malignant involvement. The more lateral of these measures 1.6 cm in short axis on image 181/4 with maximum SUV 15.2. A left external iliac lymph node measures only 0.7 cm in short axis on image 175/4 with maximum SUV 3.3, only very minimally above blood pool. There is some scattered accentuated activity in the cecum, maximum SUV 8.8, without CT correlate, likely physiologic. No hypermetabolic hepatic lesion is identified. Incidental CT findings: Cholecystectomy. Old granulomatous disease in the spleen. Aortoiliac atherosclerotic vascular disease. Descending and sigmoid colon diverticulosis. SKELETON: No significant abnormal hypermetabolic activity in this region. Incidental CT findings: Unremarkable IMPRESSION: 1. The anal mass is highly hypermetabolic with a maximum SUV of 32.6. There are 3 hypermetabolic left inguinal lymph nodes compatible with malignant involvement. A left external iliac node with minimally higher than blood pool activity measuring 0.7 cm in short axis is not entirely  specific for malignant involvement, and could be incidental. 2. 1.0 by 0.8 cm left thyroid nodule has activity mildly above background blood pool activity but below liver activity. This is not a definitive hot nodule but is relatively small at 1.0 by 0.8 cm, and has some marginal calcifications. I would suggest thyroid ultrasound for further characterization. 3. No perceptible hypermetabolic activity in the  vicinity of the lateral right hepatic lobe lesion seen on the 09/14/2019 CT. Surveillance of the liver may be warranted but this appearance on PET-CT is reassuring that this is likely not a metastatic lesion. 4. Other imaging findings of potential clinical significance: Aortic Atherosclerosis (ICD10-I70.0). Coronary atherosclerosis. Emphysema (ICD10-J43.9). Descending and sigmoid colon diverticulosis. Electronically Signed   By: Van Clines M.D.   On: 09/27/2019 17:19   Vas [place] Lower Extremity Venous (dvt) (only Mc & Wl 7a-7p)  Result Date: 10/13/2019  Lower Venous Study Indications: Swelling, and Edema.  Comparison Study: no prior Performing Technologist: Abram Sander RVS  Examination Guidelines: A complete evaluation includes B-mode imaging, spectral Doppler, color Doppler, and power Doppler as needed of all accessible portions of each vessel. Bilateral testing is considered an integral part of a complete examination. Limited examinations for reoccurring indications may be performed as noted.  +---------+---------------+---------+-----------+----------+--------------+ RIGHT    CompressibilityPhasicitySpontaneityPropertiesThrombus Aging +---------+---------------+---------+-----------+----------+--------------+ CFV      Full           Yes      Yes                                 +---------+---------------+---------+-----------+----------+--------------+ SFJ      Full                                                        +---------+---------------+---------+-----------+----------+--------------+ FV Prox  Full                                                        +---------+---------------+---------+-----------+----------+--------------+ FV Mid   Full                                                        +---------+---------------+---------+-----------+----------+--------------+ FV DistalFull                                                         +---------+---------------+---------+-----------+----------+--------------+ PFV      Full                                                        +---------+---------------+---------+-----------+----------+--------------+ POP      Full  Yes      Yes                                 +---------+---------------+---------+-----------+----------+--------------+ PTV      Full                                                        +---------+---------------+---------+-----------+----------+--------------+ PERO                                                  Not visualized +---------+---------------+---------+-----------+----------+--------------+   +---------+---------------+---------+-----------+----------+--------------+ LEFT     CompressibilityPhasicitySpontaneityPropertiesThrombus Aging +---------+---------------+---------+-----------+----------+--------------+ CFV      Full           Yes      Yes                                 +---------+---------------+---------+-----------+----------+--------------+ SFJ      Full                                                        +---------+---------------+---------+-----------+----------+--------------+ FV Prox  Full                                                        +---------+---------------+---------+-----------+----------+--------------+ FV Mid   Full                                                        +---------+---------------+---------+-----------+----------+--------------+ FV DistalFull                                                        +---------+---------------+---------+-----------+----------+--------------+ PFV      Full                                                        +---------+---------------+---------+-----------+----------+--------------+ POP      Full           Yes      Yes                                  +---------+---------------+---------+-----------+----------+--------------+ PTV      Full                                                        +---------+---------------+---------+-----------+----------+--------------+  PERO                                                  Not visualized +---------+---------------+---------+-----------+----------+--------------+     *See table(s) above for measurements and observations. Electronically signed by Servando Snare MD on 10/13/2019 at 2:16:15 PM.    Final    Ir Picc Placement Right >5 Yrs Inc Img Guide  Result Date: 10/03/2019 INDICATION: History of anal cancer. In need intravenous access for the initiation of chemotherapy. EXAM: ULTRASOUND AND FLUOROSCOPIC GUIDED PICC LINE INSERTION MEDICATIONS: None. CONTRAST:  None FLUOROSCOPY TIME:  1 minute, 18 seconds (6.6 mGy) COMPLICATIONS: None immediate. TECHNIQUE: The procedure, risks, benefits, and alternatives were explained to the patient and informed written consent was obtained. A timeout was performed prior to the initiation of the procedure. The right upper extremity was prepped with chlorhexidine in a sterile fashion, and a sterile drape was applied covering the operative field. Maximum barrier sterile technique with sterile gowns and gloves were used for the procedure. A timeout was performed prior to the initiation of the procedure. Local anesthesia was provided with 1% lidocaine. Under direct ultrasound guidance, the basilic vein was accessed with a micropuncture kit after the overlying soft tissues were anesthetized with 1% lidocaine. After the overlying soft tissues were anesthetized, a small venotomy incision was created and a micropuncture kit was utilized to access the right basilic vein. Real-time ultrasound guidance was utilized for vascular access including the acquisition of a permanent ultrasound image documenting patency of the accessed vessel. A guidewire was advanced to the level of the  superior caval-atrial junction for measurement purposes and the PICC line was cut to length. A peel-away sheath was placed and a 41 cm, 5 [place], single lumen was inserted to level of the superior caval-atrial junction. A post procedure spot fluoroscopic was obtained. The catheter easily aspirated and flushed and was sutured in place. A dressing was placed. The patient tolerated the procedure well without immediate post procedural complication. FINDINGS: After catheter placement, the tip lies within the superior cavoatrial junction. The catheter aspirates and flushes normally and is ready for immediate use. IMPRESSION: Successful ultrasound and fluoroscopic guided placement of a right basilic vein approach, 41 cm, 5 [place], single lumen PICC with tip at the superior caval-atrial junction. The PICC line is ready for immediate use. Electronically Signed   By: Sandi Mariscal M.D.   On: 10/03/2019 10:19   [place] Ekg Site Rite  Result Date: 10/18/2019 If Site Rite image not attached, placement could not be confirmed due to current cardiac rhythm.   ASSESSMENT AND PLAN: 1. 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 Biopsy 08/30/2019 confirmed well-differentiated squamous cell carcinoma with positive P 16 and p63 stains ? CTs 09/14/2019-abnormal soft tissue fullness at the lower anus/perianal soft tissues, asymmetric left inguinal lymphadenopathy, borderline enlarged left external iliac node, mild stranding and cutaneous thickening of the left gluteal fold, 2 to 3 mm pulmonary nodules, 1.6 cm right hepatic lesion-likely a hemangioma ? Began concurrent chemoRT with 5FU/mitomycin on 10/03/2019 2. COPD 3. Fibromyalgia 4. Chronic back pain 5. Hypothyroid 6. Depression 7. History of uterine cancer-age 49 8. Pain secondary to #1 9. Orthostatic hypotension, fatigue, dyspnea on exertion, early mucositis requiring supportive care on day 9, secondary to chemotherapy  10. Thrombocytopenia PLT 58K and neutropenia ANC  1.4 on day 9,  secondary to chemotherapy 11. Worsening cytopenias, PLT 8K and ANC 0.0 on day 15, secondary to chemotherapy. Started prophylaxis with cipro on 10/22 day 11 12.  Hospital admission 10/17/2019 -fatigue, pancytopenia, hypokalemia  Ms. Guo appears stable.  Labs from today have been reviewed and she continues to have pancytopenia.  Potassium has now corrected.  Diarrhea improving.  Recommendations: 1.  She is having increased pain to her rectum and mouth, will add Dilaudid 0.5 to 1 mg IV every 4 hours as needed for severe pain. 2.  Continue Granix. 3.  Platelets down slightly today without any active bleeding.  No transfusion is indicated.  Transfuse platelets if platelet count less than 10,000 or active bleeding.  Continue to follow daily CBC with differential. 4.  Hemoglobin down to 7.9 today.  No transfusion is indicated.  Transfuse RBCs for hemoglobin less than 7.   LOS: 2 days   Mikey Bussing, DNP, AGPCNP-BC, AOCNP 10/19/19 Ms. Paxson was interviewed and examined.  She continues to have pain from mucositis.  She would like to try an IV narcotic.  We will prescribe biologic.  She has persistent severe pancytopenia.  No evidence of present.  She will continue G-CSF support.  She continues Levaquin prophylaxis.

## 2019-10-19 NOTE — Evaluation (Signed)
Occupational Therapy Evaluation Patient Details Name: Joanne Klein MRN: [id number] DOB: 10-Nov-1946 Today's Date: 10/19/2019    History of Present Illness 73 yo female admitted with faigue, rectal pain, pancytopenia, thrombocytopenia. Hx of anal ca-on chemo, fibromyalgia, COPD, uterine ca, chronic back pain   Clinical Impression   Pt admitted with fatigue. Pt currently with functional limitations due to the deficits listed below (see OT Problem List).  Pt will benefit from skilled OT to increase their safety and independence with ADL and functional mobility for ADL to facilitate discharge to venue listed below.   Pt with very pleasant with good particpation. Pt has a neighbor that will A her as DC but overall limited A at home     Follow Up Recommendations  Home health OT;Supervision/Assistance - 24 hour    Equipment Recommendations  3 in 1 bedside commode    Recommendations for Other Services       Precautions / Restrictions Precautions Precautions: Fall Restrictions Weight Bearing Restrictions: No      Mobility Bed Mobility   Bed Mobility: Supine to Sit;Sit to Supine;Rolling Rolling: Modified independent (Device/Increase time)   Supine to sit: Modified independent (Device/Increase time) Sit to supine: Modified independent (Device/Increase time)      Transfers Overall transfer level: Needs assistance Equipment used: 1 person hand held assist Transfers: Sit to/from [facility] Sit to Stand: Min guard Stand pivot transfers: Min guard       General transfer comment: for safety.    Balance Overall balance assessment: Mild deficits observed, not formally tested                                         ADL either performed or assessed with clinical judgement   ADL Overall ADL's : Needs assistance/impaired Eating/Feeding: Set up;Sitting   Grooming: Min guard;Standing   Upper Body Bathing: Set up;Sitting   Lower Body  Bathing: Maximal assistance;Sit to/from stand   Upper Body Dressing : Set up;Sitting   Lower Body Dressing: Maximal assistance;Sit to/from stand;Cueing for safety   Toilet Transfer: Minimal assistance;Comfort height toilet;Ambulation   Toileting- Clothing Manipulation and Hygiene: Sit to/from stand;Cueing for safety;Cueing for compensatory techniques;Minimal assistance       Functional mobility during ADLs: Minimal assistance;Cueing for safety;Cueing for sequencing                    Pertinent Vitals/Pain Pain Assessment: 0-10 Faces Pain Scale: Hurts little more Pain Location: her bottom with sitting Pain Descriptors / Indicators: Discomfort;Sore;Pressure Pain Intervention(s): Limited activity within patient's tolerance;Repositioned     Hand Dominance     Extremity/Trunk Assessment Upper Extremity Assessment Upper Extremity Assessment: Overall WFL for tasks assessed           Communication Communication Communication: No difficulties   Cognition Arousal/Alertness: Awake/alert Behavior During Therapy: WFL for tasks assessed/performed Overall Cognitive Status: Within Functional Limits for tasks assessed                                                Home Living Family/patient expects to be discharged to:: Private residence Living Arrangements: Alone Available Help at Discharge: Family;Available PRN/intermittently Type of Home: House Home Access: Stairs to enter Entrance Stairs-Number of Steps: 3   Home Layout: One level  Home Equipment: [place] - 2 wheels;Cane - single point          Prior Functioning/Environment Level of Independence: Independent                 OT Problem List: Decreased activity tolerance;Obesity;Pain      OT Treatment/Interventions: Self-care/ADL training;DME and/or AE instruction;Patient/family education;Therapeutic activities    OT Goals(Current goals can be found in the care plan  section) Acute Rehab OT Goals Patient Stated Goal: to get better OT Goal Formulation: With patient Time For Goal Achievement: 10/26/19 ADL Goals Pt Will Perform Grooming: with modified independence;standing Pt Will Perform Lower Body Dressing: sit to/from stand;with adaptive equipment;with modified independence Pt Will Transfer to Toilet: with modified independence;regular height toilet;ambulating Pt Will Perform Toileting - Clothing Manipulation and hygiene: with modified independence;sit to/from stand  OT Frequency: Min 2X/week   Barriers to D/C: Decreased caregiver support             AM-PAC OT "6 Clicks" Daily Activity     Outcome Measure Help from another person eating meals?: None Help from another person taking care of personal grooming?: A Little Help from another person toileting, which includes using toliet, bedpan, or urinal?: A Little Help from another person bathing (including washing, rinsing, drying)?: A Little Help from another person to put on and taking off regular upper body clothing?: A Little Help from another person to put on and taking off regular lower body clothing?: A Lot 6 Click Score: 18   End of Session Equipment Utilized During Treatment: Rolling walker Nurse Communication: Mobility status  Activity Tolerance: Patient tolerated treatment well Patient left: in chair;with call bell/phone within reach  OT Visit Diagnosis: Unsteadiness on feet (R26.81);Pain Pain - part of body: (rectum)                Time: 0245-0259 OT Time Calculation (min): 14 min Charges:  OT General Charges $OT Visit: 1 Visit OT Evaluation $OT Eval Moderate Complexity: 1 Mod  Kari Baars, OT Acute Rehabilitation Services [phone] Office- [phone], Edwena Felty D 10/19/2019, 5:27 PM

## 2019-10-20 ENCOUNTER — Ambulatory Visit: Payer: Medicare Other

## 2019-10-20 DIAGNOSIS — T451X5A Adverse effect of antineoplastic and immunosuppressive drugs, initial encounter: Secondary | ICD-10-CM | POA: Diagnosis not present

## 2019-10-20 DIAGNOSIS — D61810 Antineoplastic chemotherapy induced pancytopenia: Secondary | ICD-10-CM | POA: Diagnosis not present

## 2019-10-20 DIAGNOSIS — C210 Malignant neoplasm of anus, unspecified: Secondary | ICD-10-CM | POA: Diagnosis not present

## 2019-10-20 DIAGNOSIS — E876 Hypokalemia: Secondary | ICD-10-CM | POA: Diagnosis not present

## 2019-10-20 LAB — COMPREHENSIVE METABOLIC PANEL
ALT: 9 U/L (ref 0–44)
AST: 9 U/L — ABNORMAL LOW (ref 15–41)
Albumin: 2.5 g/dL — ABNORMAL LOW (ref 3.5–5.0)
Alkaline Phosphatase: 42 U/L (ref 38–126)
Anion gap: 6 (ref 5–15)
BUN: 17 mg/dL (ref 8–23)
CO2: 24 mmol/L (ref 22–32)
Calcium: 7.9 mg/dL — ABNORMAL LOW (ref 8.9–10.3)
Chloride: 109 mmol/L (ref 98–111)
Creatinine, Ser: 0.75 mg/dL (ref 0.44–1.00)
GFR calc Af Amer: >60 mL/min (ref >60)
GFR calc non Af Amer: >60 mL/min (ref >60)
Glucose, Bld: 97 mg/dL (ref 70–99)
Potassium: 4.3 mmol/L (ref 3.5–5.1)
Sodium: 139 mmol/L (ref 135–145)
Total Bilirubin: 0.8 mg/dL (ref 0.3–1.2)
Total Protein: 5.4 g/dL — ABNORMAL LOW (ref 6.5–8.1)

## 2019-10-20 LAB — CBC WITH DIFFERENTIAL/PLATELET
HCT: 22.8 % — ABNORMAL LOW (ref 36.0–46.0)
Hemoglobin: 7.9 g/dL — ABNORMAL LOW (ref 12.0–15.0)
MCH: 30.5 pg (ref 26.0–34.0)
MCHC: 34.6 g/dL (ref 30.0–36.0)
MCV: 88 fL (ref 80.0–100.0)
Platelets: 17 K/uL — CL (ref 150–400)
RBC: 2.59 MIL/uL — ABNORMAL LOW (ref 3.87–5.11)
RDW: 11.6 % (ref 11.5–15.5)
WBC: 0.2 K/uL — CL (ref 4.0–10.5)
nRBC: 0 % (ref 0.0–0.2)

## 2019-10-20 LAB — CBC
HCT: 22.7 % — ABNORMAL LOW (ref 36.0–46.0)
Hemoglobin: 7.8 g/dL — ABNORMAL LOW (ref 12.0–15.0)
MCH: 30.5 pg (ref 26.0–34.0)
MCHC: 34.4 g/dL (ref 30.0–36.0)
MCV: 88.7 fL (ref 80.0–100.0)
Platelets: 16 10*3/uL — CL (ref 150–400)
RBC: 2.56 MIL/uL — ABNORMAL LOW (ref 3.87–5.11)
RDW: 11.8 % (ref 11.5–15.5)
WBC: 0.2 10*3/uL — CL (ref 4.0–10.5)
nRBC: 0 % (ref 0.0–0.2)

## 2019-10-20 LAB — MAGNESIUM: Magnesium: 1.7 mg/dL (ref 1.7–2.4)

## 2019-10-20 MED ORDER — HYDROCORTISONE (PERIANAL) 2.5 % EX CREA
1.0000 "application " | TOPICAL_CREAM | Freq: Two times a day (BID) | CUTANEOUS | Status: DC
  Administered 2019-10-20 – 2019-10-26 (×14): 1 via RECTAL
  Filled 2019-10-20 (×2): qty 28.35

## 2019-10-20 MED ORDER — LOPERAMIDE HCL 2 MG PO CAPS
4.0000 mg | ORAL_CAPSULE | Freq: Four times a day (QID) | ORAL | Status: DC | PRN
  Administered 2019-10-20 – 2019-10-23 (×3): 4 mg via ORAL
  Filled 2019-10-20 (×5): qty 2

## 2019-10-20 NOTE — Progress Notes (Addendum)
HEMATOLOGY-ONCOLOGY PROGRESS NOTE  SUBJECTIVE: Continues to be very tired.  Wants to sleep.  States that she had 14 loose stools yesterday and another 3 this morning.  States that it is only mucus at this point in time.  Denies rectal bleeding.  Denies abdominal pain, nausea, vomiting.  Reports ongoing pain to her mouth.  Oncology History  Anal cancer ([place])  09/15/2019 Initial Diagnosis   Anal cancer ([place])   10/03/2019 -  Chemotherapy   The patient had mitoMYcin (MUTAMYCIN) chemo injection 18 mg, 8 mg/m2 = 18 mg (100 % of original dose 8 mg/m2), Intravenous,  Once, 1 of 1 cycle Dose modification: 8 mg/m2 (original dose 8 mg/m2, Cycle 1, Reason: Provider Judgment) Administration: 18 mg (10/03/2019) fluorouracil (ADRUCIL) 8,900 mg in sodium chloride 0.9 % 72 mL chemo infusion, 1,000 mg/m2/day = 8,900 mg, Intravenous, 4D (96 hours ), 1 of 1 cycle Administration: 8,900 mg (10/03/2019)  for chemotherapy treatment.       REVIEW OF SYSTEMS:   As noted in the HPI.  I have reviewed the past medical history, past surgical history, social history and family history with the patient and they are unchanged from previous note.   PHYSICAL EXAMINATION:  Vitals:   10/19/19 2056 10/20/19 0632  BP: (!) 148/76 (!) 154/77  Pulse: 83 86  Resp: 19 19  Temp: 97.6 F (36.4 C) 97.6 F (36.4 C)  SpO2: 96% 96%   Filed Weights   10/18/19 0136  Weight: 231 lb 14.1 oz (105.2 kg)    Intake/Output from previous day: 10/28 0701 - 10/29 0700 In: 1084 [P.O.:1064; I.V.:20] Out: -   GENERAL: Resting quietly, no distress and comfortable SKIN: Scattered ecchymoses on arms and legs OROPHARYNX: No thrush or mucositis NECK: supple, thyroid normal size, non-tender, without nodularity LYMPH:  no palpable lymphadenopathy in the cervical, axillary or inguinal LUNGS: clear to auscultation and percussion with normal breathing effort HEART: regular rate & rhythm and no murmurs and no lower extremity  edema ABDOMEN: abdomen soft, non-tender and normal bowel sounds Musculoskeletal:no cyanosis of digits and no clubbing  NEURO: alert & oriented x 3 with fluent speech, no focal motor/sensory deficits Rectum: External hemorrhoids, tender at the right perineum, no significant skin breakdown, persistent tumor at the anal verge  LABORATORY DATA:  I have reviewed the data as listed CMP Latest Ref Rng & Units 10/20/2019 10/19/2019 10/18/2019  Glucose 70 - 99 mg/dL 97 118(H) 138(H)  BUN 8 - 23 mg/dL '17 17 18  '$ Creatinine 0.44 - 1.00 mg/dL 0.75 0.74 0.77  Sodium 135 - 145 mmol/L 139 142 140  Potassium 3.5 - 5.1 mmol/L 4.3 4.4 2.9(L)  Chloride 98 - 111 mmol/L 109 112(H) 109  CO2 22 - 32 mmol/L '24 25 24  '$ Calcium 8.9 - 10.3 mg/dL 7.9(L) 7.8(L) 7.4(L)  Total Protein 6.5 - 8.1 g/dL 5.4(L) 5.2(L) -  Total Bilirubin 0.3 - 1.2 mg/dL 0.8 0.7 -  Alkaline Phos 38 - 126 U/L 42 44 -  AST 15 - 41 U/L 9(L) 9(L) -  ALT 0 - 44 U/L 9 9 -    Lab Results  Component Value Date   WBC 0.2 (LL) 10/20/2019   HGB 7.8 (L) 10/20/2019   HCT 22.7 (L) 10/20/2019   MCV 88.7 10/20/2019   PLT 16 (LL) 10/20/2019   NEUTROABS 0.0 (LL) 10/17/2019    Dg Chest 2 View  Result Date: 10/12/2019 CLINICAL DATA:  Shortness of breath. Leg pain and swelling. Currently undergoing treatment for anal cancer. EXAM:  CHEST - 2 VIEW COMPARISON:  05/26/2014 FINDINGS: The heart size and mediastinal contours are within normal limits. Both lungs are clear. The visualized skeletal structures are unremarkable. IMPRESSION: No active cardiopulmonary disease. Electronically Signed   By: Lorriane Shire M.D.   On: 10/12/2019 19:10   Ct Angio Chest Pe W And/or Wo Contrast  Result Date: 10/12/2019 CLINICAL DATA:  Pt c/o leg pain, swelling, and SOB x 3 days ago. Patient has pain when she's standing on legs. Pt has rectal cancer and is currently receiving treatment for it. ^147m OMNIPAQUE IOHEXOL 350 MG/ML SOLNShortness of breath shortness of breath  h/o cancer with active treatment EXAM: CT ANGIOGRAPHY CHEST WITH CONTRAST TECHNIQUE: Multidetector CT imaging of the chest was performed using the standard protocol during bolus administration of intravenous contrast. Multiplanar CT image reconstructions and MIPs were obtained to evaluate the vascular anatomy. CONTRAST:  109mOMNIPAQUE IOHEXOL 350 MG/ML SOLN COMPARISON:  09/14/2019 FINDINGS: Cardiovascular: No filling defects within the pulmonary arteries to suggest acute pulmonary embolism. No acute findings of the aorta or great vessels. No pericardial fluid. Mediastinum/Nodes: No axillary supraclavicular adenopathy. No mediastinal hilar adenopathy no pericardial effusion. Esophagus normal Lungs/Pleura: No pulmonary infarction. Pneumonia. Centrilobular emphysema the upper lobes. No bronchiectasis. No suspicious nodules. Limited view of the liver, kidneys, pancreas are unremarkable. Normal adrenal glands. Upper Abdomen: Limited view of the liver, kidneys, pancreas are unremarkable. Normal adrenal glands. Musculoskeletal: No aggressive osseous lesion. Review of the MIP images confirms the above findings. IMPRESSION: 1. No evidence acute pulmonary embolism. 2. No acute pulmonary parenchymal findings. Aortic Atherosclerosis (ICD10-I70.0) and Emphysema (ICD10-J43.9). Electronically Signed   By: StSuzy Bouchard.D.   On: 10/12/2019 20:59   Nm Pet Image Initial (pi) Skull Base To Thigh  Result Date: 09/27/2019 CLINICAL DATA:  Initial treatment strategy for anal squamous cell carcinoma. EXAM: NUCLEAR MEDICINE PET SKULL BASE TO THIGH TECHNIQUE: 12.2 mCi F-18 FDG was injected intravenously. Full-ring PET imaging was performed from the skull base to thigh after the radiotracer. CT data was obtained and used for attenuation correction and anatomic localization. Fasting blood glucose: 100 mg/dl COMPARISON:  09/14/2019 CT scan FINDINGS: Mediastinal blood pool activity: SUV max 2.9 Liver activity: SUV max 4.3 NECK: 1.0 by  0.8 cm left thyroid nodule on image 43/4, with low-grade metabolic activity with maximum SUV 4.1. Incidental CT findings: none CHEST: No significant abnormal hypermetabolic activity in this region. Incidental CT findings: Coronary, aortic arch, and branch vessel atherosclerotic vascular disease. Centrilobular emphysema. ABDOMEN/PELVIS: High-grade activity superficially along the anus with soft tissue prominence in this vicinity, maximum SUV 32.6. Three hypermetabolic left inguinal lymph nodes are present and suspicious for malignant involvement. The more lateral of these measures 1.6 cm in short axis on image 181/4 with maximum SUV 15.2. A left external iliac lymph node measures only 0.7 cm in short axis on image 175/4 with maximum SUV 3.3, only very minimally above blood pool. There is some scattered accentuated activity in the cecum, maximum SUV 8.8, without CT correlate, likely physiologic. No hypermetabolic hepatic lesion is identified. Incidental CT findings: Cholecystectomy. Old granulomatous disease in the spleen. Aortoiliac atherosclerotic vascular disease. Descending and sigmoid colon diverticulosis. SKELETON: No significant abnormal hypermetabolic activity in this region. Incidental CT findings: Unremarkable IMPRESSION: 1. The anal mass is highly hypermetabolic with a maximum SUV of 32.6. There are 3 hypermetabolic left inguinal lymph nodes compatible with malignant involvement. A left external iliac node with minimally higher than blood pool activity measuring 0.7 cm in short axis is  not entirely specific for malignant involvement, and could be incidental. 2. 1.0 by 0.8 cm left thyroid nodule has activity mildly above background blood pool activity but below liver activity. This is not a definitive hot nodule but is relatively small at 1.0 by 0.8 cm, and has some marginal calcifications. I would suggest thyroid ultrasound for further characterization. 3. No perceptible hypermetabolic activity in the  vicinity of the lateral right hepatic lobe lesion seen on the 09/14/2019 CT. Surveillance of the liver may be warranted but this appearance on PET-CT is reassuring that this is likely not a metastatic lesion. 4. Other imaging findings of potential clinical significance: Aortic Atherosclerosis (ICD10-I70.0). Coronary atherosclerosis. Emphysema (ICD10-J43.9). Descending and sigmoid colon diverticulosis. Electronically Signed   By: Van Clines M.D.   On: 09/27/2019 17:19   Vas [place] Lower Extremity Venous (dvt) (only Mc & Wl 7a-7p)  Result Date: 10/13/2019  Lower Venous Study Indications: Swelling, and Edema.  Comparison Study: no prior Performing Technologist: Abram Sander RVS  Examination Guidelines: A complete evaluation includes B-mode imaging, spectral Doppler, color Doppler, and power Doppler as needed of all accessible portions of each vessel. Bilateral testing is considered an integral part of a complete examination. Limited examinations for reoccurring indications may be performed as noted.  +---------+---------------+---------+-----------+----------+--------------+ RIGHT    CompressibilityPhasicitySpontaneityPropertiesThrombus Aging +---------+---------------+---------+-----------+----------+--------------+ CFV      Full           Yes      Yes                                 +---------+---------------+---------+-----------+----------+--------------+ SFJ      Full                                                        +---------+---------------+---------+-----------+----------+--------------+ FV Prox  Full                                                        +---------+---------------+---------+-----------+----------+--------------+ FV Mid   Full                                                        +---------+---------------+---------+-----------+----------+--------------+ FV DistalFull                                                         +---------+---------------+---------+-----------+----------+--------------+ PFV      Full                                                        +---------+---------------+---------+-----------+----------+--------------+ POP      Full  Yes      Yes                                 +---------+---------------+---------+-----------+----------+--------------+ PTV      Full                                                        +---------+---------------+---------+-----------+----------+--------------+ PERO                                                  Not visualized +---------+---------------+---------+-----------+----------+--------------+   +---------+---------------+---------+-----------+----------+--------------+ LEFT     CompressibilityPhasicitySpontaneityPropertiesThrombus Aging +---------+---------------+---------+-----------+----------+--------------+ CFV      Full           Yes      Yes                                 +---------+---------------+---------+-----------+----------+--------------+ SFJ      Full                                                        +---------+---------------+---------+-----------+----------+--------------+ FV Prox  Full                                                        +---------+---------------+---------+-----------+----------+--------------+ FV Mid   Full                                                        +---------+---------------+---------+-----------+----------+--------------+ FV DistalFull                                                        +---------+---------------+---------+-----------+----------+--------------+ PFV      Full                                                        +---------+---------------+---------+-----------+----------+--------------+ POP      Full           Yes      Yes                                  +---------+---------------+---------+-----------+----------+--------------+ PTV      Full                                                        +---------+---------------+---------+-----------+----------+--------------+  PERO                                                  Not visualized +---------+---------------+---------+-----------+----------+--------------+     *See table(s) above for measurements and observations. Electronically signed by Servando Snare MD on 10/13/2019 at 2:16:15 PM.    Final    Ir Picc Placement Right >5 Yrs Inc Img Guide  Result Date: 10/03/2019 INDICATION: History of anal cancer. In need intravenous access for the initiation of chemotherapy. EXAM: ULTRASOUND AND FLUOROSCOPIC GUIDED PICC LINE INSERTION MEDICATIONS: None. CONTRAST:  None FLUOROSCOPY TIME:  1 minute, 18 seconds (6.6 mGy) COMPLICATIONS: None immediate. TECHNIQUE: The procedure, risks, benefits, and alternatives were explained to the patient and informed written consent was obtained. A timeout was performed prior to the initiation of the procedure. The right upper extremity was prepped with chlorhexidine in a sterile fashion, and a sterile drape was applied covering the operative field. Maximum barrier sterile technique with sterile gowns and gloves were used for the procedure. A timeout was performed prior to the initiation of the procedure. Local anesthesia was provided with 1% lidocaine. Under direct ultrasound guidance, the basilic vein was accessed with a micropuncture kit after the overlying soft tissues were anesthetized with 1% lidocaine. After the overlying soft tissues were anesthetized, a small venotomy incision was created and a micropuncture kit was utilized to access the right basilic vein. Real-time ultrasound guidance was utilized for vascular access including the acquisition of a permanent ultrasound image documenting patency of the accessed vessel. A guidewire was advanced to the level of the  superior caval-atrial junction for measurement purposes and the PICC line was cut to length. A peel-away sheath was placed and a 41 cm, 5 [place], single lumen was inserted to level of the superior caval-atrial junction. A post procedure spot fluoroscopic was obtained. The catheter easily aspirated and flushed and was sutured in place. A dressing was placed. The patient tolerated the procedure well without immediate post procedural complication. FINDINGS: After catheter placement, the tip lies within the superior cavoatrial junction. The catheter aspirates and flushes normally and is ready for immediate use. IMPRESSION: Successful ultrasound and fluoroscopic guided placement of a right basilic vein approach, 41 cm, 5 [place], single lumen PICC with tip at the superior caval-atrial junction. The PICC line is ready for immediate use. Electronically Signed   By: Sandi Mariscal M.D.   On: 10/03/2019 10:19   [place] Ekg Site Rite  Result Date: 10/18/2019 If Site Rite image not attached, placement could not be confirmed due to current cardiac rhythm.   ASSESSMENT AND PLAN: 1. 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 Biopsy 08/30/2019 confirmed well-differentiated squamous cell carcinoma with positive P 16 and p63 stains ? CTs 09/14/2019-abnormal soft tissue fullness at the lower anus/perianal soft tissues, asymmetric left inguinal lymphadenopathy, borderline enlarged left external iliac node, mild stranding and cutaneous thickening of the left gluteal fold, 2 to 3 mm pulmonary nodules, 1.6 cm right hepatic lesion-likely a hemangioma ? Began concurrent chemoRT with 5FU/mitomycin on 10/03/2019 2. COPD 3. Fibromyalgia 4. Chronic back pain 5. Hypothyroid 6. Depression 7. 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 Pain secondary to #1 9. Orthostatic hypotension, fatigue, dyspnea on exertion, early mucositis requiring supportive care on day 9, secondary to chemotherapy  10. Thrombocytopenia PLT 58K and neutropenia ANC  1.4 on day 9,  secondary to chemotherapy 11. Worsening cytopenias, PLT 8K and ANC 0.0 on day 15, secondary to chemotherapy. Started prophylaxis with cipro on 10/22 day 11 12.  Hospital admission 10/17/2019 -fatigue, pancytopenia, hypokalemia  Ms. Hoganson continues to have persistent diarrhea and pancytopenia.  She has no bleeding.  Recommendations: 1.  Continue current pain medications for pain to her rectum and mouth.  Will add Anusol HC twice daily.  Encourage patient to do sitz bath's that are ordered. 2.  Continue Granix.  Continue Levaquin prophylaxis. 3.  Platelets down slightly today without any active bleeding.  No transfusion is indicated.  Transfuse platelets if platelet count less than 10,000 or active bleeding.  Continue to follow daily CBC with differential. 4.  Hemoglobin stable today at 7.8.  No transfusion is indicated.  Transfuse RBCs for hemoglobin less than 7.   LOS: 3 days   Mikey Bussing, DNP, AGPCNP-BC, AOCNP 10/20/19 Patient now was interviewed and examined.  She continues to complain of severe pain in the mouth and rectum.  I cannot see oral ulcers or thrush.  No significant skin breakdown at the perineum.  I encouraged her to begin sitz bath's.  We will try Anusol.  She has persistent severe pancytopenia.  I recommend continuing G-CSF and Levaquin prophylaxis.  Hopefully the white count will improve over the next several days.  She reports 14 small-volume bowel movements yesterday, but these are not recorded.

## 2019-10-20 NOTE — Progress Notes (Signed)
PROGRESS NOTE    Joanne Klein  [id number] DOB: 02-12-1946 DOA: 10/17/2019 PCP: Hoyt Koch, MD   Brief Narrative:  73 year old with history of squamous cell carcinoma of anal margin, COPD, uterine cancer, GERD, hyperlipidemia, obstructive sleep apnea, IBS, hypothyroidism, fibromyalgia, chronic lower back pain presented to the hospital with complains of fatigue, mouth pain secondary to mucositis and spontaneous rectal bleeding after first chemo treatment.  Developed severe pancytopenia/neutropenia.  On outpatient chemo and radiation.  Receiving blood transfusion and platelets transfusion.  Empirically patient was started on Levaquin.  Following daily blood counts.  Oncology team following.   Assessment & Plan:   Active Problems:   Depression   Hypothyroidism   GERD (gastroesophageal reflux disease)   OCD (obsessive compulsive disorder)   COPD GOLD II    Hyperlipidemia   Anal cancer (HCC)   Thrombocytopenia (HCC)  Severe neutropenia/pancytopenia Epistaxis/rectal bleeding, secondary to thrombocytopenia, resolved -Status post chemotherapy.  Status post 2 unit PRBC transfusion.  Monitor platelets and hemoglobin.  If necessary transfer.  Hold off on transfusion today as patient is not actively bleeding. -Prophylactic continue Levaquin.  Broaden coverage if needed -PICC line -IV Granix -Sitz bath.  Nonspecific diarrhea -Noninfectious.  Likely from chemo.  If necessary depression can get antidiarrheals as needed.  Increase oral intake  Mucositis -Magic mouthwash with lidocaine.  Nystatin  Left lower extremity swelling -Recent Dopplers and CTA are negative.  Hypokalemia -Aggressive repletion  Atypical chest pain, resolved  History of anal cancer -Currently ongoing radiation and chemotherapy.  On hold  Hypothyroidism -Continue Synthroid. -TSH 2.9  Depression -Continue home meds  Moderate to severe protein calorie malnutrition -Nutrition consulted.   Prealbumin 10.7.  GERD -PPI  History of chronic COPD -Currently stable.  As needed bronchodilators  Hyperlipidemia -Statin  PT recommends-home PT  DVT prophylaxis: SCDs Code Status: Limited code, okay with intubation Family Communication: None Disposition Plan: Still feeling quite weak.  Maintain hospital stay.  Unsafe for discharge.  Eventually will need home health  Consultants:   Oncology  Procedures:   PICC placement by IR 10/27  Antimicrobials:   Empiric Levaquin   Subjective: Tells me she feels very tired.  At least 5 watery bowel movements in last 12 hours.  Denies any abdominal pain.  Tells me she wants to sleep today and will try any diabetic chair later.  Review of Systems Otherwise negative except as per HPI, including: General = no fevers, chills, dizziness, malaise, fatigue HEENT/EYES = negative for pain, redness, loss of vision, double vision, blurred vision, loss of hearing, sore throat, hoarseness, dysphagia Cardiovascular= negative for chest pain, palpitation, murmurs, lower extremity swelling Respiratory/lungs= negative for shortness of breath, cough, hemoptysis, wheezing, mucus production Gastrointestinal= negative for nausea, vomiting,, abdominal pain, melena, hematemesis Genitourinary= negative for Dysuria, Hematuria, Change in Urinary Frequency MSK = Negative for arthralgia, myalgias, Back Pain, Joint swelling  Neurology= Negative for headache, seizures, numbness, tingling  Psychiatry= Negative for anxiety, depression, suicidal and homocidal ideation Allergy/Immunology= Medication/Food allergy as listed  Skin= Negative for Rash, lesions, ulcers, itching  Objective: Vitals:   10/18/19 1900 10/19/19 1357 10/19/19 2056 10/20/19 0632  BP: 120/79 119/87 (!) 148/76 (!) 154/77  Pulse: 89 74 83 86  Resp: 18 (!) 22 19 19   Temp: 98.2 F (36.8 C) 98.2 F (36.8 C) 97.6 F (36.4 C) 97.6 F (36.4 C)  TempSrc: Oral Oral Oral Oral  SpO2: 96% 98% 96%  96%  Weight:      Height:  Intake/Output Summary (Last 24 hours) at 10/20/2019 1053 Last data filed at 10/20/2019 0915 Gross per 24 hour  Intake 1089 ml  Output -  Net 1089 ml   Filed Weights   10/18/19 0136  Weight: 105.2 kg    Examination: Constitutional: NAD, calm, comfortable Eyes: PERRL, lids and conjunctivae normal ENMT: Mucous membranes are moist. Posterior pharynx clear of any exudate or lesions.Normal dentition.  Neck: normal, supple, no masses, no thyromegaly Respiratory: clear to auscultation bilaterally, no wheezing, no crackles. Normal respiratory effort. No accessory muscle use.  Cardiovascular: Regular rate and rhythm, no murmurs / rubs / gallops. No extremity edema. 2+ pedal pulses. No carotid bruits.  Abdomen: no tenderness, no masses palpated. No hepatosplenomegaly. Bowel sounds positive.  Musculoskeletal: no clubbing / cyanosis. No joint deformity upper and lower extremities. Good ROM, no contractures. Normal muscle tone.  Skin: no rashes, lesions, ulcers. No induration Neurologic: CN 2-12 grossly intact. Sensation intact, DTR normal. Strength 5/5 in all 4.  Psychiatric: Flat affect and mood   Data Reviewed:   CBC: Recent Labs  Lab 10/13/19 1443 10/14/19 1259 10/17/19 1436 10/17/19 1945 10/18/19 0906 10/19/19 0339 10/19/19 1326 10/20/19 0331  WBC 0.7* 0.5* 0.2* 0.2* 0.1* 0.2* 0.2* 0.2*  NEUTROABS 0.3* 0.0* 0.0*  --   --   --   --   --   HGB 11.5* 11.3* 9.7* 9.2* 8.3* 7.9* 8.2* 7.8*  HCT 32.8* 31.0* 27.1* 26.1* 23.4* 22.4* 23.8* 22.7*  MCV 86.5 85.4 85.5 87.6 87.0 88.5 89.1 88.7  PLT 31* 18* 8* 7* 28* 22* 20* 16*   Basic Metabolic Panel: Recent Labs  Lab 10/17/19 1945 10/18/19 0906 10/18/19 1441 10/19/19 0339 10/20/19 0331  NA 138 140 140 142 139  K 2.8* 2.7* 2.9* 4.4 4.3  CL 107 108 109 112* 109  CO2 23 23 24 25 24   GLUCOSE 92 112* 138* 118* 97  BUN 22 18 18 17 17   CREATININE 0.92 0.87 0.77 0.74 0.75  CALCIUM 7.8* 7.4* 7.4*  7.8* 7.9*  MG  --  1.8  --  1.7 1.7  PHOS  --  2.7  --   --   --    GFR: Estimated Creatinine Clearance: 81.9 mL/min (by C-G formula based on SCr of 0.75 mg/dL). Liver Function Tests: Recent Labs  Lab 10/17/19 1945 10/18/19 0906 10/19/19 0339 10/20/19 0331  AST 11* 9* 9* 9*  ALT 10 10 9 9   ALKPHOS 53 46 44 42  BILITOT 1.0 1.0 0.7 0.8  PROT 5.9* 5.6* 5.2* 5.4*  ALBUMIN 3.0* 2.8* 2.5* 2.5*   No results for input(s): LIPASE, AMYLASE in the last 168 hours. No results for input(s): AMMONIA in the last 168 hours. Coagulation Profile: Recent Labs  Lab 10/17/19 1945  INR 1.3*   Cardiac Enzymes: No results for input(s): CKTOTAL, CKMB, CKMBINDEX, TROPONINI in the last 168 hours. BNP (last 3 results) No results for input(s): PROBNP in the last 8760 hours. HbA1C: No results for input(s): HGBA1C in the last 72 hours. CBG: No results for input(s): GLUCAP in the last 168 hours. Lipid Profile: No results for input(s): CHOL, HDL, LDLCALC, TRIG, CHOLHDL, LDLDIRECT in the last 72 hours. Thyroid Function Tests: Recent Labs    10/18/19 0906  TSH 2.971   Anemia Panel: No results for input(s): VITAMINB12, FOLATE, FERRITIN, TIBC, IRON, RETICCTPCT in the last 72 hours. Sepsis Labs: No results for input(s): PROCALCITON, LATICACIDVEN in the last 168 hours.  Recent Results (from the past 240 hour(s))  SARS  CORONAVIRUS 2 (TAT 6-24 HRS) Nasopharyngeal Nasopharyngeal Swab     Status: None   Collection Time: 10/17/19  4:16 PM   Specimen: Nasopharyngeal Swab  Result Value Ref Range Status   SARS Coronavirus 2 NEGATIVE NEGATIVE Final    Comment: (NOTE) SARS-CoV-2 target nucleic acids are NOT DETECTED. The SARS-CoV-2 RNA is generally detectable in upper and lower respiratory specimens during the acute phase of infection. Negative results do not preclude SARS-CoV-2 infection, do not rule out co-infections with other pathogens, and should not be used as the sole basis for treatment or other  patient management decisions. Negative results must be combined with clinical observations, patient history, and epidemiological information. The expected result is Negative. Fact Sheet for Patients: SugarRoll.be Fact Sheet for Healthcare Providers: https://www.woods-mathews.com/ This test is not yet approved or cleared by the [place] FDA and  has been authorized for detection and/or diagnosis of SARS-CoV-2 by FDA under an Emergency Use Authorization (EUA). This EUA will remain  in effect (meaning this test can be used) for the duration of the COVID-19 declaration under Section 56 4(b)(1) of the Act, 21 U.S.C. section 360bbb-3(b)(1), unless the authorization is terminated or revoked sooner. Performed at [facility], [place] [address] [place], [place] [address]          Radiology Studies: No results found.      Scheduled Meds: . atorvastatin  20 mg Oral QODAY  . barrier cream  1 application Topical BID  . Chlorhexidine Gluconate Cloth  6 each Topical Daily  . feeding supplement (ENSURE ENLIVE)  237 mL Oral BID BM  . hydrocortisone  1 application Rectal BID  . levofloxacin  500 mg Oral q1800  . levothyroxine  75 mcg Oral Q0600  . pantoprazole  40 mg Oral Daily  . sertraline  200 mg Oral QHS  . sodium chloride flush  10-40 mL Intracatheter Q12H  . Tbo-filgastrim (GRANIX) SQ  480 mcg Subcutaneous q1800   Continuous Infusions:    LOS: 3 days   Time spent= 35 mins    Ankit Arsenio Loader, MD [facility]  If 7PM-7AM, please contact night-coverage  10/20/2019, 10:53 AM

## 2019-10-20 NOTE — Care Management Important Message (Signed)
Important Message  Patient Details IM Letter given to [facility] SW to present to the Patient Name: LUCILIA MICHAL MRN: [id number] Date of Birth: 12/27/1945   Medicare Important Message Given:  Yes     [name] 10/20/2019, 11:19 AM

## 2019-10-20 NOTE — Progress Notes (Signed)
PT Cancellation Note  Patient Details Name: Joanne Klein MRN: [id number] DOB: 1946-11-01   Cancelled Treatment:    Reason Eval/Treat Not Completed: Fatigue/lethargy limiting ability to participate Pt declined PT due to fatigue.  States she has walked to bathroom several times and sat up for at least 30 minutes.  RN confirmed.  Will f/u as able.   Maggie Font, PT Acute Rehab Services [phone]    Karlton Lemon 10/20/2019, 1:52 PM

## 2019-10-21 ENCOUNTER — Ambulatory Visit: Payer: Medicare Other

## 2019-10-21 DIAGNOSIS — T451X5A Adverse effect of antineoplastic and immunosuppressive drugs, initial encounter: Secondary | ICD-10-CM | POA: Diagnosis not present

## 2019-10-21 DIAGNOSIS — D709 Neutropenia, unspecified: Secondary | ICD-10-CM

## 2019-10-21 DIAGNOSIS — R5081 Fever presenting with conditions classified elsewhere: Secondary | ICD-10-CM

## 2019-10-21 DIAGNOSIS — E876 Hypokalemia: Secondary | ICD-10-CM | POA: Diagnosis not present

## 2019-10-21 DIAGNOSIS — C210 Malignant neoplasm of anus, unspecified: Secondary | ICD-10-CM | POA: Diagnosis not present

## 2019-10-21 DIAGNOSIS — D61810 Antineoplastic chemotherapy induced pancytopenia: Secondary | ICD-10-CM | POA: Diagnosis not present

## 2019-10-21 LAB — DIFFERENTIAL

## 2019-10-21 LAB — COMPREHENSIVE METABOLIC PANEL
ALT: 11 U/L (ref 0–44)
AST: 9 U/L — ABNORMAL LOW (ref 15–41)
Albumin: 2.6 g/dL — ABNORMAL LOW (ref 3.5–5.0)
Alkaline Phosphatase: 47 U/L (ref 38–126)
Anion gap: 10 (ref 5–15)
BUN: 15 mg/dL (ref 8–23)
CO2: 25 mmol/L (ref 22–32)
Calcium: 8.1 mg/dL — ABNORMAL LOW (ref 8.9–10.3)
Chloride: 103 mmol/L (ref 98–111)
Creatinine, Ser: 0.83 mg/dL (ref 0.44–1.00)
GFR calc Af Amer: >60 mL/min (ref >60)
GFR calc non Af Amer: >60 mL/min (ref >60)
Glucose, Bld: 101 mg/dL — ABNORMAL HIGH (ref 70–99)
Potassium: 3.6 mmol/L (ref 3.5–5.1)
Sodium: 138 mmol/L (ref 135–145)
Total Bilirubin: 1.2 mg/dL (ref 0.3–1.2)
Total Protein: 5.5 g/dL — ABNORMAL LOW (ref 6.5–8.1)

## 2019-10-21 LAB — CBC
HCT: 22.8 % — ABNORMAL LOW (ref 36.0–46.0)
Hemoglobin: 7.9 g/dL — ABNORMAL LOW (ref 12.0–15.0)
MCH: 30.5 pg (ref 26.0–34.0)
MCHC: 34.6 g/dL (ref 30.0–36.0)
MCV: 88 fL (ref 80.0–100.0)
Platelets: 18 10*3/uL — CL (ref 150–400)
RBC: 2.59 MIL/uL — ABNORMAL LOW (ref 3.87–5.11)
RDW: 11.4 % — ABNORMAL LOW (ref 11.5–15.5)
WBC: 0.3 10*3/uL — CL (ref 4.0–10.5)
nRBC: 0 % (ref 0.0–0.2)

## 2019-10-21 LAB — MAGNESIUM: Magnesium: 1.5 mg/dL — ABNORMAL LOW (ref 1.7–2.4)

## 2019-10-21 MED ORDER — SODIUM CHLORIDE 0.9 % IV SOLN
INTRAVENOUS | Status: DC
  Administered 2019-10-21 – 2019-10-25 (×6): via INTRAVENOUS

## 2019-10-21 NOTE — Progress Notes (Addendum)
PROGRESS NOTE    Joanne Klein  [id number] DOB: 1946-12-15 DOA: 10/17/2019 PCP: Hoyt Koch, MD   Brief Narrative:  73 year old with history of squamous cell carcinoma of anal margin, COPD, uterine cancer, GERD, hyperlipidemia, obstructive sleep apnea, IBS, hypothyroidism, fibromyalgia, chronic lower back pain presented to the hospital with complains of fatigue, mouth pain secondary to mucositis and spontaneous rectal bleeding after first chemo treatment.  Developed severe pancytopenia/neutropenia.  On outpatient chemo and radiation.  Receiving blood transfusion and platelets transfusion.  Empirically patient was started on Levaquin.  Following daily blood counts.  Oncology team following.  Patient seen today, 10/21/2019.  No new complaints.  Oncology PT is highly appreciated.  WBC 0.3 and platelet count is 18.  We will continue current management.  Patient's daughter updated.   Assessment & Plan:   Active Problems:   Depression   Hypothyroidism   GERD (gastroesophageal reflux disease)   OCD (obsessive compulsive disorder)   COPD GOLD II    Hyperlipidemia   Anal cancer (HCC)   Thrombocytopenia (HCC)  Severe neutropenia/pancytopenia Epistaxis/rectal bleeding, secondary to thrombocytopenia, resolved -Status post chemotherapy.  Status post 2 unit PRBC transfusion.  Monitor platelets and hemoglobin.  If necessary transfer.  Hold off on transfusion today as patient is not actively bleeding. -Prophylactic continue Levaquin.  Broaden coverage if needed -PICC line -IV Granix -Sitz bath. 10/21/2019: Continue to monitor WBC and platelet count.  Nonspecific diarrhea -Noninfectious.  Likely from chemo.  If necessary depression can get antidiarrheals as needed.  Increase oral intake  Mucositis -Magic mouthwash with lidocaine.  Nystatin  Left lower extremity swelling -Recent Dopplers and CTA are negative.  Hypokalemia -Aggressive repletion -10/21/2019: Potassium today  3.6.  Atypical chest pain, resolved  History of anal cancer -Currently ongoing radiation and chemotherapy.  On hold  Hypothyroidism -Continue Synthroid. -TSH 2.9  Depression -Continue home meds  Moderate to severe protein calorie malnutrition -Nutrition consulted.  Prealbumin 10.7.  GERD -PPI  History of chronic COPD -Currently stable.  As needed bronchodilators  Hyperlipidemia -Statin  PT recommends-home PT  DVT prophylaxis: SCDs Code Status: Limited code, okay with intubation Family Communication: Daughter Disposition Plan: This will depend on hospital course Consultants:   Oncology  Procedures:   PICC placement by IR 10/27  Antimicrobials:   Empiric Levaquin   Subjective: No new complaints. No reported fever or chills. No bleeding   Objective: Vitals:   10/19/19 2056 10/20/19 0632 10/20/19 1358 10/20/19 1948  BP: (!) 148/76 (!) 154/77 (!) 146/70 (!) 145/72  Pulse: 83 86 80 82  Resp: 19 19 18 16   Temp: 97.6 F (36.4 C) 97.6 F (36.4 C) 99.3 F (37.4 C) (!) 100.4 F (38 C)  TempSrc: Oral Oral Oral Oral  SpO2: 96% 96% 94% 90%  Weight:      Height:        Intake/Output Summary (Last 24 hours) at 10/21/2019 1312 Last data filed at 10/21/2019 0938 Gross per 24 hour  Intake 720 ml  Output -  Net 720 ml   Filed Weights   10/18/19 0136  Weight: 105.2 kg    Examination: Constitutional: NAD, calm, comfortable HEENT: Pallor.  No jaundice Neck: normal, supple, no masses, no thyromegaly Respiratory: clear to auscultation bilaterally, no wheezing, no crackles. Normal respiratory effort. No accessory muscle use.  Cardiovascular: Regular rate and rhythm, no murmurs / rubs / gallops. No extremity edema. 2+ pedal pulses. No carotid bruits.  Abdomen: Obese, soft and nontender.  Organs are difficult to assess.  Neurologic: Awake and alert.  Patient moves all extremities.    Data Reviewed:   CBC: Recent Labs  Lab 10/17/19 1436  10/19/19 0339  10/19/19 1326 10/20/19 0331 10/20/19 1226 10/21/19 0343  WBC 0.2*   < > 0.2* 0.2* 0.2* 0.2* 0.3*  NEUTROABS 0.0*  --   --   --   --   --   --   HGB 9.7*   < > 7.9* 8.2* 7.8* 7.9* 7.9*  HCT 27.1*   < > 22.4* 23.8* 22.7* 22.8* 22.8*  MCV 85.5   < > 88.5 89.1 88.7 88.0 88.0  PLT 8*   < > 22* 20* 16* 17* 18*   < > = values in this interval not displayed.   Basic Metabolic Panel: Recent Labs  Lab 10/18/19 0906 10/18/19 1441 10/19/19 0339 10/20/19 0331 10/21/19 0343  NA 140 140 142 139 138  K 2.7* 2.9* 4.4 4.3 3.6  CL 108 109 112* 109 103  CO2 23 24 25 24 25   GLUCOSE 112* 138* 118* 97 101*  BUN 18 18 17 17 15   CREATININE 0.87 0.77 0.74 0.75 0.83  CALCIUM 7.4* 7.4* 7.8* 7.9* 8.1*  MG 1.8  --  1.7 1.7 1.5*  PHOS 2.7  --   --   --   --    GFR: Estimated Creatinine Clearance: 78.9 mL/min (by C-G formula based on SCr of 0.83 mg/dL). Liver Function Tests: Recent Labs  Lab 10/17/19 1945 10/18/19 0906 10/19/19 0339 10/20/19 0331 10/21/19 0343  AST 11* 9* 9* 9* 9*  ALT 10 10 9 9 11   ALKPHOS 53 46 44 42 47  BILITOT 1.0 1.0 0.7 0.8 1.2  PROT 5.9* 5.6* 5.2* 5.4* 5.5*  ALBUMIN 3.0* 2.8* 2.5* 2.5* 2.6*   No results for input(s): LIPASE, AMYLASE in the last 168 hours. No results for input(s): AMMONIA in the last 168 hours. Coagulation Profile: Recent Labs  Lab 10/17/19 1945  INR 1.3*   Cardiac Enzymes: No results for input(s): CKTOTAL, CKMB, CKMBINDEX, TROPONINI in the last 168 hours. BNP (last 3 results) No results for input(s): PROBNP in the last 8760 hours. HbA1C: No results for input(s): HGBA1C in the last 72 hours. CBG: No results for input(s): GLUCAP in the last 168 hours. Lipid Profile: No results for input(s): CHOL, HDL, LDLCALC, TRIG, CHOLHDL, LDLDIRECT in the last 72 hours. Thyroid Function Tests: No results for input(s): TSH, T4TOTAL, FREET4, T3FREE, THYROIDAB in the last 72 hours. Anemia Panel: No results for input(s): VITAMINB12, FOLATE, FERRITIN, TIBC,  IRON, RETICCTPCT in the last 72 hours. Sepsis Labs: No results for input(s): PROCALCITON, LATICACIDVEN in the last 168 hours.  Recent Results (from the past 240 hour(s))  SARS CORONAVIRUS 2 (TAT 6-24 HRS) Nasopharyngeal Nasopharyngeal Swab     Status: None   Collection Time: 10/17/19  4:16 PM   Specimen: Nasopharyngeal Swab  Result Value Ref Range Status   SARS Coronavirus 2 NEGATIVE NEGATIVE Final    Comment: (NOTE) SARS-CoV-2 target nucleic acids are NOT DETECTED. The SARS-CoV-2 RNA is generally detectable in upper and lower respiratory specimens during the acute phase of infection. Negative results do not preclude SARS-CoV-2 infection, do not rule out co-infections with other pathogens, and should not be used as the sole basis for treatment or other patient management decisions. Negative results must be combined with clinical observations, patient history, and epidemiological information. The expected result is Negative. Fact Sheet for Patients: SugarRoll.be Fact Sheet for Healthcare Providers: https://www.woods-mathews.com/ This test is not yet  approved or cleared by the [place] and  has been authorized for detection and/or diagnosis of SARS-CoV-2 by FDA under an Emergency Use Authorization (EUA). This EUA will remain  in effect (meaning this test can be used) for the duration of the COVID-19 declaration under Section 56 4(b)(1) of the Act, 21 U.S.C. section 360bbb-3(b)(1), unless the authorization is terminated or revoked sooner. Performed at [facility], [place] [address] [place], [place] [address]          Radiology Studies: No results found.      Scheduled Meds: . atorvastatin  20 mg Oral QODAY  . barrier cream  1 application Topical BID  . Chlorhexidine Gluconate Cloth  6 each Topical Daily  . feeding supplement (ENSURE ENLIVE)  237 mL Oral BID BM  . hydrocortisone  1 application Rectal BID  .  levofloxacin  500 mg Oral q1800  . levothyroxine  75 mcg Oral Q0600  . pantoprazole  40 mg Oral Daily  . sertraline  200 mg Oral QHS  . sodium chloride flush  10-40 mL Intracatheter Q12H  . Tbo-filgastrim (GRANIX) SQ  480 mcg Subcutaneous q1800   Continuous Infusions: . sodium chloride       LOS: 4 days   Time spent= 25 mins  Bonnell Public, MD [facility]  If 7PM-7AM, please contact night-coverage  10/21/2019, 1:12 PM

## 2019-10-21 NOTE — Progress Notes (Signed)
Patient verbalizes complaint of oral and buttock pain. Pain scale: 7/10. PRN Dilaudid IV administered per order.

## 2019-10-21 NOTE — TOC Initial Note (Signed)
Transition of Care [facility]) - Initial/Assessment Note    Patient Details  Name: Joanne Klein MRN: [id number] Date of Birth: 11-Aug-1946  [facility] CM/SW Contact:    Trish Mage, LCSW Phone Number: 10/21/2019, 11:59 AM  Clinical Narrative:  Joanne Klein seen in response to PT, OT assessment and recommendation for  Equipment and [facility] services.  She lives alone with her cat, has a close friend next door, niece and nephew a block away, and daughter, granddaughter and great granddaughter in [place] for supports.  Uses no DME, nor does she want any. Admits that she has steps she has to negotiate at home, but is hopeful that when she is released she will feel well enough to be OK with them.  Not necessarily invested in PT, OT either, but OK with me making a referral.  [place] willing to pick up OT, PT at d/c.  Will need orders for same. TOC will continue to follow during the course of hospitalization.               Expected Discharge Plan: [place] Barriers to Discharge: No Barriers Identified   Patient Goals and CMS Choice Patient states their goals for this hospitalization and ongoing recovery are:: "I had a very bad reaction to the chemo.  I don't know what the plan is going forward." CMS Medicare.gov Compare Post Acute Care list provided to:: Patient Choice offered to / list presented to : Patient  Expected Discharge Plan and Services Expected Discharge Plan: [place]   Discharge Planning Services: CM Consult Post Acute Care Choice: [place] arrangements for the past 2 months: Single Family Home                           HH Arranged: PT, OT HH Agency: [facility] Date [place]: 10/21/19 Time HH Agency Contacted: 46 Representative spoke with at [place]: Georgina Snell  Prior Living Arrangements/Services Living arrangements for the past 2 months: [place] Lives with:: Self Patient  language and need for interpreter reviewed:: Yes Do you feel safe going back to the place where you live?: Yes      Need for Family Participation in Patient Care: Yes (Comment) Care giver support system in place?: Yes (comment)   Criminal Activity/Legal Involvement Pertinent to Current Situation/Hospitalization: No - Comment as needed  Activities of Daily Living Home Assistive Devices/Equipment: Eyeglasses, Shower chair with back ADL Screening (condition at time of admission) Patient's cognitive ability adequate to safely complete daily activities?: Yes Is the patient deaf or have difficulty hearing?: No Does the patient have difficulty seeing, even when wearing glasses/contacts?: Yes Does the patient have difficulty concentrating, remembering, or making decisions?: Yes Patient able to express need for assistance with ADLs?: Yes Does the patient have difficulty dressing or bathing?: Yes Independently performs ADLs?: Yes (appropriate for developmental age) Does the patient have difficulty walking or climbing stairs?: Yes Weakness of Legs: Both Weakness of Arms/Hands: None  Permission Sought/Granted                  Emotional Assessment Appearance:: Appears stated age Attitude/Demeanor/Rapport: Engaged Affect (typically observed): Appropriate Orientation: : Oriented to Self, Oriented to Place, Oriented to  Time, Oriented to Situation Alcohol / Substance Use: Not Applicable Psych Involvement: No (comment)  Admission diagnosis:  Anal Cancer thrombocytopenia Neutropenia Patient Active Problem List   Diagnosis Date Noted  .  Thrombocytopenia ([place]) 10/17/2019  . Anal cancer ([place]) 09/15/2019  . Routine general medical examination at a health care facility 11/19/2016  . 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 Morbid obesity ([place]) 06/26/2014  . COPD GOLD II    . Hyperlipidemia   . Arthritis 01/16/2014  . Depression 01/13/2014  . Hypothyroidism 01/13/2014  . GERD (gastroesophageal reflux  disease) 01/13/2014  . OCD (obsessive compulsive disorder) 01/13/2014  . Fibromyalgia 01/13/2014   PCP:  Hoyt Koch, MD Pharmacy:   [facility] [place], [place] [address] Phone: [phone] Fax: [phone]     Social Determinants of Health (SDOH) Interventions    Readmission Risk Interventions No flowsheet data found.

## 2019-10-21 NOTE — Progress Notes (Signed)
BP= 145/61. Patient has history of HTN. Medication was given as MD ordered. Will recheck BP later

## 2019-10-21 NOTE — Progress Notes (Signed)
Physical Therapy Treatment Patient Details Name: Joanne Klein MRN: [id number] DOB: [birth year] Today's Date: 10/21/2019    History of Present Illness 73 yo female admitted with faigue, rectal pain, pancytopenia, thrombocytopenia. Hx of anal ca-on chemo, fibromyalgia, COPD, uterine ca, chronic back pain    PT Comments    Pt was able to demonstrate increased gait distance today and met her goals.  She did get [facility] with activity but reports this is her baseline.  She declined further need for PT as she reports she is at baseline.  Will d/c PT as pt is at baseline and demonstrates safe gait and transfers.    Follow Up Recommendations  Home health PT;Supervision - Intermittent     Equipment Recommendations  None recommended by PT    Recommendations for Other Services       Precautions / Restrictions Precautions Precautions: Fall Restrictions Weight Bearing Restrictions: No    Mobility  Bed Mobility Overal bed mobility: Independent                Transfers Overall transfer level: Independent     Sit to Stand: Independent         General transfer comment: [place] ADLs independently  Ambulation/Gait Ambulation/Gait assistance: Supervision Gait Distance (Feet): 150 Feet Assistive device: IV Pole Gait Pattern/deviations: WFL(Within Functional Limits)     General Gait Details: normal gait pattern, no LOB, did fatigue easily and with [facility] - pt reports this is her baseline and that she is ambulating in room independently   Pt on RA with sats 93% rest, dropped to 87% with 150' ambulation and recovered in 1 minute.  With ambulation in room sats 90%.     Stairs             Wheelchair Mobility    Modified Rankin (Stroke Patients Only)       Balance Overall balance assessment: Independent                                          Cognition Arousal/Alertness: Awake/alert Behavior During Therapy: WFL for tasks  assessed/performed Overall Cognitive Status: Within Functional Limits for tasks assessed                                 General Comments: Pt with "Do not disturb" sign on door- checked with RN and pt ok for PT.  Pt reports not getting much rest and that she is walking in room independently.      Exercises      General Comments        Pertinent Vitals/Pain Pain Assessment: Faces Faces Pain Scale: Hurts a little bit Pain Location: her bottom with sitting Pain Descriptors / Indicators: Discomfort;Sore;Pressure Pain Intervention(s): Limited activity within patient's tolerance;Repositioned    Home Living                      Prior Function            PT Goals (current goals can now be found in the care plan section) Progress towards PT goals: Goals met/education completed, patient discharged from PT    Frequency           PT Plan Other (comment)(Will d/c PT, pt reports at baseline declined further need)    Co-evaluation  AM-PAC PT "6 Clicks" Mobility   Outcome Measure  Help needed turning from your back to your side while in a flat bed without using bedrails?: None Help needed moving from lying on your back to sitting on the side of a flat bed without using bedrails?: None Help needed moving to and from a bed to a chair (including a wheelchair)?: None Help needed standing up from a chair using your arms (e.g., wheelchair or bedside chair)?: None Help needed to walk in hospital room?: None Help needed climbing 3-5 steps with a railing? : A Little 6 Click Score: 23    End of Session Equipment Utilized During Treatment: Gait belt Activity Tolerance: Patient limited by fatigue Patient left: in bed;with call bell/phone within reach(bed alarm not set - was not on at arrival, ambulating independently, nursing aware)   PT Visit Diagnosis: Unsteadiness on feet (R26.81);Muscle weakness (generalized) (M62.81);Difficulty in walking,  not elsewhere classified (R26.2)     Time: 8675-4492 PT Time Calculation (min) (ACUTE ONLY): 19 min  Charges:  $Gait Training: 8-22 mins                     Maggie Font, PT Acute Rehab Services [phone]    Karlton Lemon 10/21/2019, 1:17 PM

## 2019-10-21 NOTE — Progress Notes (Addendum)
HEMATOLOGY-ONCOLOGY PROGRESS NOTE  SUBJECTIVE: Continues to be fatigued.  Not eating much secondary to mouth pain.  Still having mouth and rectal pain.  Rectal pain is improved.  She reports having less loose stools yesterday-approximately 6 or 7.  Denies bleeding.  Has swelling in her left leg which she states is unchanged for her.  She had a previous bilateral Doppler ultrasound performed on 10/12/2019 that did not show any evidence of DVT in either leg.  Oncology History  Anal cancer ([place])  09/15/2019 Initial Diagnosis   Anal cancer ([place])   10/03/2019 -  Chemotherapy   The patient had mitoMYcin (MUTAMYCIN) chemo injection 18 mg, 8 mg/m2 = 18 mg (100 % of original dose 8 mg/m2), Intravenous,  Once, 1 of 1 cycle Dose modification: 8 mg/m2 (original dose 8 mg/m2, Cycle 1, Reason: Provider Judgment) Administration: 18 mg (10/03/2019) fluorouracil (ADRUCIL) 8,900 mg in sodium chloride 0.9 % 72 mL chemo infusion, 1,000 mg/m2/day = 8,900 mg, Intravenous, 4D (96 hours ), 1 of 1 cycle Administration: 8,900 mg (10/03/2019)  for chemotherapy treatment.     REVIEW OF SYSTEMS:   As noted in the HPI.  I have reviewed the past medical history, past surgical history, social history and family history with the patient and they are unchanged from previous note.   PHYSICAL EXAMINATION:  Vitals:   10/20/19 1358 10/20/19 1948  BP: (!) 146/70 (!) 145/72  Pulse: 80 82  Resp: 18 16  Temp: 99.3 F (37.4 C) (!) 100.4 F (38 C)  SpO2: 94% 90%   Filed Weights   10/18/19 0136  Weight: 231 lb 14.1 oz (105.2 kg)    Intake/Output from previous day: 10/29 0701 - 10/30 0700 In: 840 [P.O.:840] Out: -   GENERAL: Resting quietly, no distress and comfortable SKIN: Scattered ecchymoses on arms and legs OROPHARYNX: Mucositis noted to the base of her oropharynx NECK: supple, thyroid normal size, non-tender, without nodularity LYMPH:  no palpable lymphadenopathy in the cervical, axillary or  inguinal LUNGS: clear to auscultation and percussion with normal breathing effort HEART: regular rate & rhythm and no murmurs.  Left lower extremity edema noted. ABDOMEN: abdomen soft, non-tender and normal bowel sounds Musculoskeletal:no cyanosis of digits and no clubbing  NEURO: alert & oriented x 3 with fluent speech, no focal motor/sensory deficits Rectum: External hemorrhoids, tender at the right perineum, no significant skin breakdown, persistent tumor at the anal verge  LABORATORY DATA:  I have reviewed the data as listed CMP Latest Ref Rng & Units 10/21/2019 10/20/2019 10/19/2019  Glucose 70 - 99 mg/dL 101(H) 97 118(H)  BUN 8 - 23 mg/dL 15 17 17   Creatinine 0.44 - 1.00 mg/dL 0.83 0.75 0.74  Sodium 135 - 145 mmol/L 138 139 142  Potassium 3.5 - 5.1 mmol/L 3.6 4.3 4.4  Chloride 98 - 111 mmol/L 103 109 112(H)  CO2 22 - 32 mmol/L 25 24 25   Calcium 8.9 - 10.3 mg/dL 8.1(L) 7.9(L) 7.8(L)  Total Protein 6.5 - 8.1 g/dL 5.5(L) 5.4(L) 5.2(L)  Total Bilirubin 0.3 - 1.2 mg/dL 1.2 0.8 0.7  Alkaline Phos 38 - 126 U/L 47 42 44  AST 15 - 41 U/L 9(L) 9(L) 9(L)  ALT 0 - 44 U/L 11 9 9     Lab Results  Component Value Date   WBC 0.3 (LL) 10/21/2019   HGB 7.9 (L) 10/21/2019   HCT 22.8 (L) 10/21/2019   MCV 88.0 10/21/2019   PLT 18 (LL) 10/21/2019   NEUTROABS 0.0 (LL) 10/17/2019  Dg Chest 2 View  Result Date: 10/12/2019 CLINICAL DATA:  Shortness of breath. Leg pain and swelling. Currently undergoing treatment for anal cancer. EXAM: CHEST - 2 VIEW COMPARISON:  05/26/2014 FINDINGS: The heart size and mediastinal contours are within normal limits. Both lungs are clear. The visualized skeletal structures are unremarkable. IMPRESSION: No active cardiopulmonary disease. Electronically Signed   By: Lorriane Shire M.D.   On: 10/12/2019 19:10   Ct Angio Chest Pe W And/or Wo Contrast  Result Date: 10/12/2019 CLINICAL DATA:  Pt c/o leg pain, swelling, and SOB x 3 days ago. Patient has pain when  she's standing on legs. Pt has rectal cancer and is currently receiving treatment for it. ^147m OMNIPAQUE IOHEXOL 350 MG/ML SOLNShortness of breath shortness of breath h/o cancer with active treatment EXAM: CT ANGIOGRAPHY CHEST WITH CONTRAST TECHNIQUE: Multidetector CT imaging of the chest was performed using the standard protocol during bolus administration of intravenous contrast. Multiplanar CT image reconstructions and MIPs were obtained to evaluate the vascular anatomy. CONTRAST:  1032mOMNIPAQUE IOHEXOL 350 MG/ML SOLN COMPARISON:  09/14/2019 FINDINGS: Cardiovascular: No filling defects within the pulmonary arteries to suggest acute pulmonary embolism. No acute findings of the aorta or great vessels. No pericardial fluid. Mediastinum/Nodes: No axillary supraclavicular adenopathy. No mediastinal hilar adenopathy no pericardial effusion. Esophagus normal Lungs/Pleura: No pulmonary infarction. Pneumonia. Centrilobular emphysema the upper lobes. No bronchiectasis. No suspicious nodules. Limited view of the liver, kidneys, pancreas are unremarkable. Normal adrenal glands. Upper Abdomen: Limited view of the liver, kidneys, pancreas are unremarkable. Normal adrenal glands. Musculoskeletal: No aggressive osseous lesion. Review of the MIP images confirms the above findings. IMPRESSION: 1. No evidence acute pulmonary embolism. 2. No acute pulmonary parenchymal findings. Aortic Atherosclerosis (ICD10-I70.0) and Emphysema (ICD10-J43.9). Electronically Signed   By: StSuzy Bouchard.D.   On: 10/12/2019 20:59   Nm Pet Image Initial (pi) Skull Base To Thigh  Result Date: 09/27/2019 CLINICAL DATA:  Initial treatment strategy for anal squamous cell carcinoma. EXAM: NUCLEAR MEDICINE PET SKULL BASE TO THIGH TECHNIQUE: 12.2 mCi F-18 FDG was injected intravenously. Full-ring PET imaging was performed from the skull base to thigh after the radiotracer. CT data was obtained and used for attenuation correction and anatomic  localization. Fasting blood glucose: 100 mg/dl COMPARISON:  09/14/2019 CT scan FINDINGS: Mediastinal blood pool activity: SUV max 2.9 Liver activity: SUV max 4.3 NECK: 1.0 by 0.8 cm left thyroid nodule on image 43/4, with low-grade metabolic activity with maximum SUV 4.1. Incidental CT findings: none CHEST: No significant abnormal hypermetabolic activity in this region. Incidental CT findings: Coronary, aortic arch, and branch vessel atherosclerotic vascular disease. Centrilobular emphysema. ABDOMEN/PELVIS: High-grade activity superficially along the anus with soft tissue prominence in this vicinity, maximum SUV 32.6. Three hypermetabolic left inguinal lymph nodes are present and suspicious for malignant involvement. The more lateral of these measures 1.6 cm in short axis on image 181/4 with maximum SUV 15.2. A left external iliac lymph node measures only 0.7 cm in short axis on image 175/4 with maximum SUV 3.3, only very minimally above blood pool. There is some scattered accentuated activity in the cecum, maximum SUV 8.8, without CT correlate, likely physiologic. No hypermetabolic hepatic lesion is identified. Incidental CT findings: Cholecystectomy. Old granulomatous disease in the spleen. Aortoiliac atherosclerotic vascular disease. Descending and sigmoid colon diverticulosis. SKELETON: No significant abnormal hypermetabolic activity in this region. Incidental CT findings: Unremarkable IMPRESSION: 1. The anal mass is highly hypermetabolic with a maximum SUV of 32.6. There are 3 hypermetabolic left inguinal  lymph nodes compatible with malignant involvement. A left external iliac node with minimally higher than blood pool activity measuring 0.7 cm in short axis is not entirely specific for malignant involvement, and could be incidental. 2. 1.0 by 0.8 cm left thyroid nodule has activity mildly above background blood pool activity but below liver activity. This is not a definitive hot nodule but is relatively small  at 1.0 by 0.8 cm, and has some marginal calcifications. I would suggest thyroid ultrasound for further characterization. 3. No perceptible hypermetabolic activity in the vicinity of the lateral right hepatic lobe lesion seen on the 09/14/2019 CT. Surveillance of the liver may be warranted but this appearance on PET-CT is reassuring that this is likely not a metastatic lesion. 4. Other imaging findings of potential clinical significance: Aortic Atherosclerosis (ICD10-I70.0). Coronary atherosclerosis. Emphysema (ICD10-J43.9). Descending and sigmoid colon diverticulosis. Electronically Signed   By: Van Clines M.D.   On: 09/27/2019 17:19   Vas [place] Lower Extremity Venous (dvt) (only Mc & Wl 7a-7p)  Result Date: 10/13/2019  Lower Venous Study Indications: Swelling, and Edema.  Comparison Study: no prior Performing Technologist: Abram Sander RVS  Examination Guidelines: A complete evaluation includes B-mode imaging, spectral Doppler, color Doppler, and power Doppler as needed of all accessible portions of each vessel. Bilateral testing is considered an integral part of a complete examination. Limited examinations for reoccurring indications may be performed as noted.  +---------+---------------+---------+-----------+----------+--------------+ RIGHT    CompressibilityPhasicitySpontaneityPropertiesThrombus Aging +---------+---------------+---------+-----------+----------+--------------+ CFV      Full           Yes      Yes                                 +---------+---------------+---------+-----------+----------+--------------+ SFJ      Full                                                        +---------+---------------+---------+-----------+----------+--------------+ FV Prox  Full                                                        +---------+---------------+---------+-----------+----------+--------------+ FV Mid   Full                                                         +---------+---------------+---------+-----------+----------+--------------+ FV DistalFull                                                        +---------+---------------+---------+-----------+----------+--------------+ PFV      Full                                                        +---------+---------------+---------+-----------+----------+--------------+  POP      Full           Yes      Yes                                 +---------+---------------+---------+-----------+----------+--------------+ PTV      Full                                                        +---------+---------------+---------+-----------+----------+--------------+ PERO                                                  Not visualized +---------+---------------+---------+-----------+----------+--------------+   +---------+---------------+---------+-----------+----------+--------------+ LEFT     CompressibilityPhasicitySpontaneityPropertiesThrombus Aging +---------+---------------+---------+-----------+----------+--------------+ CFV      Full           Yes      Yes                                 +---------+---------------+---------+-----------+----------+--------------+ SFJ      Full                                                        +---------+---------------+---------+-----------+----------+--------------+ FV Prox  Full                                                        +---------+---------------+---------+-----------+----------+--------------+ FV Mid   Full                                                        +---------+---------------+---------+-----------+----------+--------------+ FV DistalFull                                                        +---------+---------------+---------+-----------+----------+--------------+ PFV      Full                                                         +---------+---------------+---------+-----------+----------+--------------+ POP      Full           Yes      Yes                                 +---------+---------------+---------+-----------+----------+--------------+  PTV      Full                                                        +---------+---------------+---------+-----------+----------+--------------+ PERO                                                  Not visualized +---------+---------------+---------+-----------+----------+--------------+     *See table(s) above for measurements and observations. Electronically signed by Servando Snare MD on 10/13/2019 at 2:16:15 PM.    Final    Ir Picc Placement Right >5 Yrs Inc Img Guide  Result Date: 10/03/2019 INDICATION: History of anal cancer. In need intravenous access for the initiation of chemotherapy. EXAM: ULTRASOUND AND FLUOROSCOPIC GUIDED PICC LINE INSERTION MEDICATIONS: None. CONTRAST:  None FLUOROSCOPY TIME:  1 minute, 18 seconds (6.6 mGy) COMPLICATIONS: None immediate. TECHNIQUE: The procedure, risks, benefits, and alternatives were explained to the patient and informed written consent was obtained. A timeout was performed prior to the initiation of the procedure. The right upper extremity was prepped with chlorhexidine in a sterile fashion, and a sterile drape was applied covering the operative field. Maximum barrier sterile technique with sterile gowns and gloves were used for the procedure. A timeout was performed prior to the initiation of the procedure. Local anesthesia was provided with 1% lidocaine. Under direct ultrasound guidance, the basilic vein was accessed with a micropuncture kit after the overlying soft tissues were anesthetized with 1% lidocaine. After the overlying soft tissues were anesthetized, a small venotomy incision was created and a micropuncture kit was utilized to access the right basilic vein. Real-time ultrasound guidance was utilized for vascular  access including the acquisition of a permanent ultrasound image documenting patency of the accessed vessel. A guidewire was advanced to the level of the superior caval-atrial junction for measurement purposes and the PICC line was cut to length. A peel-away sheath was placed and a 41 cm, 5 [place], single lumen was inserted to level of the superior caval-atrial junction. A post procedure spot fluoroscopic was obtained. The catheter easily aspirated and flushed and was sutured in place. A dressing was placed. The patient tolerated the procedure well without immediate post procedural complication. FINDINGS: After catheter placement, the tip lies within the superior cavoatrial junction. The catheter aspirates and flushes normally and is ready for immediate use. IMPRESSION: Successful ultrasound and fluoroscopic guided placement of a right basilic vein approach, 41 cm, 5 [place], single lumen PICC with tip at the superior caval-atrial junction. The PICC line is ready for immediate use. Electronically Signed   By: Sandi Mariscal M.D.   On: 10/03/2019 10:19   [place] Ekg Site Rite  Result Date: 10/18/2019 If Site Rite image not attached, placement could not be confirmed due to current cardiac rhythm.   ASSESSMENT AND PLAN: 1. 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 Biopsy 08/30/2019 confirmed well-differentiated squamous cell carcinoma with positive P 16 and p63 stains ? 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 Began concurrent chemoRT with 5FU/mitomycin on 10/03/2019 2. COPD 3. Fibromyalgia 4. Chronic back pain 5. Hypothyroid 6. Depression 7. History of uterine cancer-age 5 8. Pain secondary to #1 9. Orthostatic hypotension,  fatigue, dyspnea on exertion, early mucositis requiring supportive care on day 9, secondary to chemotherapy  10. Thrombocytopenia PLT 58K and neutropenia ANC 1.4 on day 9, secondary to chemotherapy 11. Worsening cytopenias, PLT 8K and ANC 0.0 on day 15, secondary to chemotherapy. Started prophylaxis with cipro on 10/22 day 11 12.  Hospital admission 10/17/2019 -fatigue, pancytopenia, hypokalemia  Ms. Foell continues to have persistent pancytopenia with a slight increase in her platelet count this morning.  Her white blood cell count and hemoglobin remain stable.  Diarrhea has significantly improved.  She has persistent mouth pain due to mucositis.  Rectal pain improved.  Recommendations: 1.  Continue current pain medications, barrier cream, and Anusol HC for pain to her rectum and mouth.  encouraged the patient to do her ordered sitz baths. 2.  Continue Granix.  Continue Levaquin prophylaxis.  Broad-spectrum IV antibiotics for a fever spike. 3.  Platelets slightly improved this morning and she is without any active bleeding.  No transfusion is indicated today.  Transfuse platelets if platelet count less than 10,000 or active bleeding.  Continue to follow daily CBC with differential. 4.  Hemoglobin stable today at 7.9.  No transfusion is indicated.  Transfuse RBCs for hemoglobin less than 7. 5.  She has poor oral intake.  Will begin normal saline at 75 cc/h.   LOS: 4 days   Mikey Bussing, DNP, AGPCNP-BC, AOCNP 10/21/19 Ms. Govoni was interviewed and examined.  And rectal pain have improved.  She continues to have mouth and throat pain.  She has ulcerations at the lower gumline. She is now day 19 following cycle one 5-FU/mitomycin-C.  The platelet count is showing evidence of recovery and the white count is slightly higher today.  Hopefully the neutrophils and platelets will recover over the next few days. She had a low-grade fever last night.  She will need cultures, a chest x-ray, and  broad-spectrum IV antibiotics for a fever of greater than 101 degrees. Oncology will see her over the weekend.

## 2019-10-21 NOTE — Progress Notes (Signed)
Dr. Learta Codding at bedside. Verbally ordered NS @ 75 IVF continuous. Per Dr. Learta Codding, patient will have a Doppler of left lower extremity. Per his assessment, patient has multiple sites of ulceration present to oral cavity. Patient reports generalized feeling of malaise and weakness.

## 2019-10-22 ENCOUNTER — Inpatient Hospital Stay (HOSPITAL_COMMUNITY): Payer: Medicare Other

## 2019-10-22 DIAGNOSIS — E876 Hypokalemia: Secondary | ICD-10-CM | POA: Diagnosis not present

## 2019-10-22 DIAGNOSIS — C210 Malignant neoplasm of anus, unspecified: Secondary | ICD-10-CM | POA: Diagnosis not present

## 2019-10-22 DIAGNOSIS — D701 Agranulocytosis secondary to cancer chemotherapy: Secondary | ICD-10-CM

## 2019-10-22 DIAGNOSIS — T451X5A Adverse effect of antineoplastic and immunosuppressive drugs, initial encounter: Secondary | ICD-10-CM | POA: Diagnosis not present

## 2019-10-22 DIAGNOSIS — D61810 Antineoplastic chemotherapy induced pancytopenia: Secondary | ICD-10-CM | POA: Diagnosis not present

## 2019-10-22 LAB — URINALYSIS, ROUTINE W REFLEX MICROSCOPIC
Bilirubin Urine: NEGATIVE
Glucose, UA: NEGATIVE mg/dL
Ketones, ur: NEGATIVE mg/dL
Leukocytes,Ua: NEGATIVE
Nitrite: NEGATIVE
Protein, ur: NEGATIVE mg/dL
Specific Gravity, Urine: 1.015 (ref 1.005–1.030)
pH: 5 (ref 5.0–8.0)

## 2019-10-22 LAB — MAGNESIUM: Magnesium: 1.6 mg/dL — ABNORMAL LOW (ref 1.7–2.4)

## 2019-10-22 LAB — BASIC METABOLIC PANEL
Anion gap: 8 (ref 5–15)
BUN: 14 mg/dL (ref 8–23)
CO2: 26 mmol/L (ref 22–32)
Calcium: 8 mg/dL — ABNORMAL LOW (ref 8.9–10.3)
Chloride: 105 mmol/L (ref 98–111)
Creatinine, Ser: 0.79 mg/dL (ref 0.44–1.00)
GFR calc Af Amer: >60 mL/min (ref >60)
GFR calc non Af Amer: >60 mL/min (ref >60)
Glucose, Bld: 102 mg/dL — ABNORMAL HIGH (ref 70–99)
Potassium: 3.4 mmol/L — ABNORMAL LOW (ref 3.5–5.1)
Sodium: 139 mmol/L (ref 135–145)

## 2019-10-22 LAB — CBC
HCT: 21.6 % — ABNORMAL LOW (ref 36.0–46.0)
Hemoglobin: 7.2 g/dL — ABNORMAL LOW (ref 12.0–15.0)
MCH: 29.5 pg (ref 26.0–34.0)
MCHC: 33.3 g/dL (ref 30.0–36.0)
MCV: 88.5 fL (ref 80.0–100.0)
Platelets: 19 10*3/uL — CL (ref 150–400)
RBC: 2.44 MIL/uL — ABNORMAL LOW (ref 3.87–5.11)
RDW: 11.5 % (ref 11.5–15.5)
WBC: 0.4 10*3/uL — CL (ref 4.0–10.5)
nRBC: 0 % (ref 0.0–0.2)

## 2019-10-22 MED ORDER — POTASSIUM CHLORIDE CRYS ER 20 MEQ PO TBCR
40.0000 meq | EXTENDED_RELEASE_TABLET | Freq: Once | ORAL | Status: AC
  Administered 2019-10-22: 40 meq via ORAL
  Filled 2019-10-22: qty 2

## 2019-10-22 NOTE — Progress Notes (Signed)
Hematology oncology: Patient chart was reviewed and patient did not want to be disturbed CBC Latest Ref Rng & Units 10/22/2019 10/21/2019 10/20/2019  WBC 4.0 - 10.5 K/uL 0.4(LL) 0.3(LL) 0.2(LL)  Hemoglobin 12.0 - 15.0 g/dL 7.2(L) 7.9(L) 7.9(L)  Hematocrit 36.0 - 46.0 % 21.6(L) 22.8(L) 22.8(L)  Platelets 150 - 400 K/uL 19(LL) 18(LL) 17(LL)   CBC    Component Value Date/Time   WBC 0.4 (LL) 10/22/2019 0256   RBC 2.44 (L) 10/22/2019 0256   HGB 7.2 (L) 10/22/2019 0256   HGB 9.7 (L) 10/17/2019 1436   HCT 21.6 (L) 10/22/2019 0256   PLT 19 (LL) 10/22/2019 0256   PLT 8 (LL) 10/17/2019 1436   MCV 88.5 10/22/2019 0256   MCH 29.5 10/22/2019 0256   MCHC 33.3 10/22/2019 0256   RDW 11.5 10/22/2019 0256   LYMPHSABS 0.2 (L) 10/17/2019 1436   MONOABS 0.0 (L) 10/17/2019 1436   EOSABS 0.0 10/17/2019 1436   BASOSABS 0.0 10/17/2019 1436   CMP Latest Ref Rng & Units 10/22/2019 10/21/2019 10/20/2019  Glucose 70 - 99 mg/dL 102(H) 101(H) 97  BUN 8 - 23 mg/dL 14 15 17   Creatinine 0.44 - 1.00 mg/dL 0.79 0.83 0.75  Sodium 135 - 145 mmol/L 139 138 139  Potassium 3.5 - 5.1 mmol/L 3.4(L) 3.6 4.3  Chloride 98 - 111 mmol/L 105 103 109  CO2 22 - 32 mmol/L 26 25 24   Calcium 8.9 - 10.3 mg/dL 8.0(L) 8.1(L) 7.9(L)  Total Protein 6.5 - 8.1 g/dL - 5.5(L) 5.4(L)  Total Bilirubin 0.3 - 1.2 mg/dL - 1.2 0.8  Alkaline Phos 38 - 126 U/L - 47 42  AST 15 - 41 U/L - 9(L) 9(L)  ALT 0 - 44 U/L - 11 9     Plan: 1. Pancytopenia due to chemo.  Profound bone marrow suppression. Awaiting count recovery Daily Granix 2. Platelets stable 3. Mucositis: Magic Mouth wash 4. Anal cancer: follows Dr.Sherrill

## 2019-10-22 NOTE — Progress Notes (Signed)
PRN Oxycodone 5 mg and Perocet 5-325 mg given for mouth, throat, and buttock pain. Pain scale: 7/10. Pain states swallowing is "extremely painful".  Patient is reporting urinary urgency and frequency. Patient has been assisted to bathroom 4 times since beginning of shift. Reports minimal output. Last BM on 10/21/2019. Reports stool was pudding consistency. Patient is easily fatigued and has difficulty positioning herself in bed after using bathroom. Patient has repeatedly declined use of BSC stating she cannot wipe her completely. Oxygen @ 2L [place] due to low sat level on previous shift. IVF continuous @ 75 mL/hr per order. Patient declined sitz bath and is requesting a shower today. HOB elevated. Call bell within reach. Will continue to monitor.

## 2019-10-22 NOTE — Progress Notes (Signed)
PROGRESS NOTE    Joanne Klein  [id number] DOB: [birth year] DOA: 10/17/2019 PCP: Hoyt Koch, MD   Brief Narrative:  73 year old with history of squamous cell carcinoma of anal margin, COPD, uterine cancer, GERD, hyperlipidemia, obstructive sleep apnea, IBS, hypothyroidism, fibromyalgia, chronic lower back pain presented to the hospital with complains of fatigue, mouth pain secondary to mucositis and spontaneous rectal bleeding after first chemo treatment.  Developed severe pancytopenia/neutropenia.  On outpatient chemo and radiation.  Receiving blood transfusion and platelets transfusion.  Empirically patient was started on Levaquin.  Following daily blood counts.  Oncology team following.  Patient seen today, 10/21/2019.  No new complaints.  Oncology PT is highly appreciated.  WBC 0.3 and platelet count is 18.  We will continue current management.  Patient's daughter updated.  10/22/2019: Patient seen.  No new complaint.  WBC 0.4 today and platelet count is 19.  Continue current management.   Assessment & Plan:   Active Problems:   Depression   Hypothyroidism   GERD (gastroesophageal reflux disease)   OCD (obsessive compulsive disorder)   COPD GOLD II    Hyperlipidemia   Anal cancer (HCC)   Thrombocytopenia (HCC)  Severe neutropenia/pancytopenia Epistaxis/rectal bleeding, secondary to thrombocytopenia, resolved -Status post chemotherapy.  Status post 2 unit PRBC transfusion.  Monitor platelets and hemoglobin.  If necessary transfer.  Hold off on transfusion today as patient is not actively bleeding. -Prophylactic continue Levaquin.  Broaden coverage if needed -PICC line -IV Granix -Sitz bath. 10/22/2019: Continue to monitor WBC and platelet count.  Panculture patient.  Urinalysis.  Chest x-ray.  Nonspecific diarrhea -Noninfectious.  Likely from chemo.  If necessary depression can get antidiarrheals as needed.  Increase oral intake  Mucositis -Magic mouthwash  with lidocaine.  Nystatin  Left lower extremity swelling -Recent Dopplers and CTA are negative.  Hypokalemia -Aggressive repletion -10/22/2019: Potassium today 3.4.  K-Dur 40 M EQ p.o. x1 dose.  Atypical chest pain, resolved  History of anal cancer -Currently ongoing radiation and chemotherapy.  On hold  Hypothyroidism -Continue Synthroid. -TSH 2.9  Depression -Continue home meds  Moderate to severe protein calorie malnutrition -Nutrition consulted.  Prealbumin 10.7.  GERD -PPI  History of chronic COPD -Currently stable.  As needed bronchodilators  Hyperlipidemia -Statin  PT recommends-home PT  DVT prophylaxis: SCDs Code Status: Limited code, okay with intubation Family Communication: Daughter Disposition Plan: This will depend on hospital course Consultants:   Oncology  Procedures:   PICC placement by IR 10/27  Antimicrobials:   Empiric Levaquin   Subjective: No new complaints. No reported fever or chills. No bleeding   Objective: Vitals:   10/20/19 1948 10/21/19 1402 10/21/19 2130 10/22/19 0500  BP: (!) 145/72 (!) 145/61 (!) 141/70 (!) 145/65  Pulse: 82 88 86 98  Resp: 16 20 20 20   Temp: (!) 100.4 F (38 C) 98.2 F (36.8 C) 99.6 F (37.6 C) 98.1 F (36.7 C)  TempSrc: Oral Oral Oral Oral  SpO2: 90% 90% 95% 98%  Weight:      Height:        Intake/Output Summary (Last 24 hours) at 10/22/2019 1028 Last data filed at 10/22/2019 0500 Gross per 24 hour  Intake 1096.08 ml  Output -  Net 1096.08 ml   Filed Weights   10/18/19 0136  Weight: 105.2 kg    Examination: Constitutional: NAD, calm, comfortable HEENT: Pallor.  No jaundice Neck: normal, supple, no masses, no thyromegaly Respiratory: clear to auscultation bilaterally, no wheezing, no crackles. Normal respiratory effort.  No accessory muscle use.  Cardiovascular: Regular rate and rhythm, no murmurs / rubs / gallops. No extremity edema. 2+ pedal pulses. No carotid bruits.   Abdomen: Obese, soft and nontender.  Organs are difficult to assess. Neurologic: Awake and alert.  Patient moves all extremities.    Data Reviewed:   CBC: Recent Labs  Lab 10/17/19 1436  10/19/19 1326 10/20/19 0331 10/20/19 1226 10/21/19 0343 10/22/19 0256  WBC 0.2*   < > 0.2* 0.2* 0.2* 0.3* 0.4*  NEUTROABS 0.0*  --   --   --   --   --   --   HGB 9.7*   < > 8.2* 7.8* 7.9* 7.9* 7.2*  HCT 27.1*   < > 23.8* 22.7* 22.8* 22.8* 21.6*  MCV 85.5   < > 89.1 88.7 88.0 88.0 88.5  PLT 8*   < > 20* 16* 17* 18* 19*   < > = values in this interval not displayed.   Basic Metabolic Panel: Recent Labs  Lab 10/18/19 0906 10/18/19 1441 10/19/19 0339 10/20/19 0331 10/21/19 0343 10/22/19 0256  NA 140 140 142 139 138 139  K 2.7* 2.9* 4.4 4.3 3.6 3.4*  CL 108 109 112* 109 103 105  CO2 23 24 25 24 25 26   GLUCOSE 112* 138* 118* 97 101* 102*  BUN 18 18 17 17 15 14   CREATININE 0.87 0.77 0.74 0.75 0.83 0.79  CALCIUM 7.4* 7.4* 7.8* 7.9* 8.1* 8.0*  MG 1.8  --  1.7 1.7 1.5* 1.6*  PHOS 2.7  --   --   --   --   --    GFR: Estimated Creatinine Clearance: 81.9 mL/min (by C-G formula based on SCr of 0.79 mg/dL). Liver Function Tests: Recent Labs  Lab 10/17/19 1945 10/18/19 0906 10/19/19 0339 10/20/19 0331 10/21/19 0343  AST 11* 9* 9* 9* 9*  ALT 10 10 9 9 11   ALKPHOS 53 46 44 42 47  BILITOT 1.0 1.0 0.7 0.8 1.2  PROT 5.9* 5.6* 5.2* 5.4* 5.5*  ALBUMIN 3.0* 2.8* 2.5* 2.5* 2.6*   No results for input(s): LIPASE, AMYLASE in the last 168 hours. No results for input(s): AMMONIA in the last 168 hours. Coagulation Profile: Recent Labs  Lab 10/17/19 1945  INR 1.3*   Cardiac Enzymes: No results for input(s): CKTOTAL, CKMB, CKMBINDEX, TROPONINI in the last 168 hours. BNP (last 3 results) No results for input(s): PROBNP in the last 8760 hours. HbA1C: No results for input(s): HGBA1C in the last 72 hours. CBG: No results for input(s): GLUCAP in the last 168 hours. Lipid Profile: No results  for input(s): CHOL, HDL, LDLCALC, TRIG, CHOLHDL, LDLDIRECT in the last 72 hours. Thyroid Function Tests: No results for input(s): TSH, T4TOTAL, FREET4, T3FREE, THYROIDAB in the last 72 hours. Anemia Panel: No results for input(s): VITAMINB12, FOLATE, FERRITIN, TIBC, IRON, RETICCTPCT in the last 72 hours. Sepsis Labs: No results for input(s): PROCALCITON, LATICACIDVEN in the last 168 hours.  Recent Results (from the past 240 hour(s))  SARS CORONAVIRUS 2 (TAT 6-24 HRS) Nasopharyngeal Nasopharyngeal Swab     Status: None   Collection Time: 10/17/19  4:16 PM   Specimen: Nasopharyngeal Swab  Result Value Ref Range Status   SARS Coronavirus 2 NEGATIVE NEGATIVE Final    Comment: (NOTE) SARS-CoV-2 target nucleic acids are NOT DETECTED. The SARS-CoV-2 RNA is generally detectable in upper and lower respiratory specimens during the acute phase of infection. Negative results do not preclude SARS-CoV-2 infection, do not rule out co-infections with other  pathogens, and should not be used as the sole basis for treatment or other patient management decisions. Negative results must be combined with clinical observations, patient history, and epidemiological information. The expected result is Negative. Fact Sheet for Patients: SugarRoll.be Fact Sheet for Healthcare Providers: https://www.woods-mathews.com/ This test is not yet approved or cleared by the [place] FDA and  has been authorized for detection and/or diagnosis of SARS-CoV-2 by FDA under an Emergency Use Authorization (EUA). This EUA will remain  in effect (meaning this test can be used) for the duration of the COVID-19 declaration under Section 56 4(b)(1) of the Act, 21 U.S.C. section 360bbb-3(b)(1), unless the authorization is terminated or revoked sooner. Performed at [facility], [place] [address] [place], [place] [address]          Radiology Studies: No results found.       Scheduled Meds: . atorvastatin  20 mg Oral QODAY  . barrier cream  1 application Topical BID  . Chlorhexidine Gluconate Cloth  6 each Topical Daily  . feeding supplement (ENSURE ENLIVE)  237 mL Oral BID BM  . hydrocortisone  1 application Rectal BID  . levofloxacin  500 mg Oral q1800  . levothyroxine  75 mcg Oral Q0600  . pantoprazole  40 mg Oral Daily  . sertraline  200 mg Oral QHS  . sodium chloride flush  10-40 mL Intracatheter Q12H  . Tbo-filgastrim (GRANIX) SQ  480 mcg Subcutaneous q1800   Continuous Infusions: . sodium chloride 75 mL/hr at 10/22/19 0216     LOS: 5 days   Time spent= 25 mins  Bonnell Public, MD [facility]  If 7PM-7AM, please contact night-coverage  10/22/2019, 10:28 AM

## 2019-10-22 NOTE — Progress Notes (Signed)
BP= 143/79. Patient has history of HTN. Medication was given to patient as scheduled. Will monitor Bp later.

## 2019-10-23 LAB — BASIC METABOLIC PANEL
Anion gap: 7 (ref 5–15)
BUN: 14 mg/dL (ref 8–23)
CO2: 26 mmol/L (ref 22–32)
Calcium: 7.8 mg/dL — ABNORMAL LOW (ref 8.9–10.3)
Chloride: 107 mmol/L (ref 98–111)
Creatinine, Ser: 0.81 mg/dL (ref 0.44–1.00)
GFR calc Af Amer: >60 mL/min (ref >60)
GFR calc non Af Amer: >60 mL/min (ref >60)
Glucose, Bld: 115 mg/dL — ABNORMAL HIGH (ref 70–99)
Potassium: 3.5 mmol/L (ref 3.5–5.1)
Sodium: 140 mmol/L (ref 135–145)

## 2019-10-23 LAB — CBC WITH DIFFERENTIAL/PLATELET
HCT: 21.8 % — ABNORMAL LOW (ref 36.0–46.0)
Hemoglobin: 7.2 g/dL — ABNORMAL LOW (ref 12.0–15.0)
MCH: 30.4 pg (ref 26.0–34.0)
MCHC: 33 g/dL (ref 30.0–36.0)
MCV: 92 fL (ref 80.0–100.0)
Platelets: 28 10*3/uL — CL (ref 150–400)
RBC: 2.37 MIL/uL — ABNORMAL LOW (ref 3.87–5.11)
RDW: 11.9 % (ref 11.5–15.5)
WBC: 1 10*3/uL — CL (ref 4.0–10.5)
nRBC: 3 % — ABNORMAL HIGH (ref 0.0–0.2)

## 2019-10-23 LAB — MAGNESIUM: Magnesium: 1.6 mg/dL — ABNORMAL LOW (ref 1.7–2.4)

## 2019-10-23 NOTE — Progress Notes (Signed)
PROGRESS NOTE    GENEIVE KAWAHARA  [id number] DOB: 12-11-1946 DOA: 10/17/2019 PCP: Hoyt Koch, MD   Brief Narrative:  73 year old with history of squamous cell carcinoma of anal margin, COPD, uterine cancer, GERD, hyperlipidemia, obstructive sleep apnea, IBS, hypothyroidism, fibromyalgia, chronic lower back pain presented to the hospital with complains of fatigue, mouth pain secondary to mucositis and spontaneous rectal bleeding after first chemo treatment.  Developed severe pancytopenia/neutropenia.  On outpatient chemo and radiation.  Receiving blood transfusion and platelets transfusion.  Empirically patient was started on Levaquin.  Following daily blood counts.  Oncology team following.  Patient seen today, 10/21/2019.  No new complaints.  Oncology PT is highly appreciated.  WBC 0.3 and platelet count is 18.  We will continue current management.  Patient's daughter updated.  10/22/2019: Patient seen.  No new complaint.  WBC 0.4 today and platelet count is 19.  Continue current management. 10/23/2019: Patient seen.  Also discussed extensively with the oncologist, Dr. Lindi Adie.  WBC is 1 today and the platelet count is 28 (both improving).  We will continue current management.  No new changes.   Assessment & Plan:   Active Problems:   Depression   Hypothyroidism   GERD (gastroesophageal reflux disease)   OCD (obsessive compulsive disorder)   COPD GOLD II    Hyperlipidemia   Anal cancer (HCC)   Thrombocytopenia (HCC)   Chemotherapy-induced neutropenia (HCC)  Severe neutropenia/pancytopenia Epistaxis/rectal bleeding, secondary to thrombocytopenia, resolved -Status post chemotherapy.  Status post 2 unit PRBC transfusion.  Monitor platelets and hemoglobin.  If necessary transfer.  Hold off on transfusion today as patient is not actively bleeding. -Prophylactic continue Levaquin.  Broaden coverage if needed -PICC line -IV Granix -Sitz bath. 10/22/2019: Continue to monitor  WBC and platelet count.  Panculture patient.  Urinalysis.  Chest x-ray. 10/23/2019: UA is nonrevealing.  Chest x-ray report has not revealed any significant findings.  Cultures are still pending.  WBC is improving.  WBC is 1 today.  Platelet count is 28.  We will continue current management.  Nonspecific diarrhea -Noninfectious.  Likely from chemo.  If necessary depression can get antidiarrheals as needed.  Increase oral intake  Mucositis -Magic mouthwash with lidocaine.  Nystatin  Left lower extremity swelling -Recent Dopplers and CTA are negative.  Hypokalemia -Aggressive repletion -10/22/2019: Potassium today 3.4.  K-Dur 40 M EQ p.o. x1 dose. -10/23/2019: Potassium is 3.5 today.  Atypical chest pain, resolved  History of anal cancer -Currently ongoing radiation and chemotherapy.  On hold  Hypothyroidism -Continue Synthroid. -TSH 2.9  Depression -Continue home meds  Moderate to severe protein calorie malnutrition -Nutrition consulted.  Prealbumin 10.7.  GERD -PPI  History of chronic COPD -Currently stable.  As needed bronchodilators  Hyperlipidemia -Statin  PT recommends-home PT  DVT prophylaxis: SCDs Code Status: Limited code, okay with intubation Family Communication: Daughter Disposition Plan: This will depend on hospital course Consultants:   Oncology  Procedures:   PICC placement by IR 10/27  Antimicrobials:   Empiric Levaquin   Subjective: No new complaints. No reported fever or chills. No bleeding   Objective: Vitals:   10/22/19 0500 10/22/19 1438 10/22/19 2051 10/23/19 0611  BP: (!) 145/65 136/66 (!) 143/79 136/71  Pulse: 98 79 86 84  Resp: 20 14 18 19   Temp: 98.1 F (36.7 C) 98.1 F (36.7 C) 98 F (36.7 C) 98.4 F (36.9 C)  TempSrc: Oral  Oral Oral  SpO2: 98% 96% 98% 96%  Weight:  Height:        Intake/Output Summary (Last 24 hours) at 10/23/2019 1025 Last data filed at 10/23/2019 0400 Gross per 24 hour  Intake 1673.29  ml  Output -  Net 1673.29 ml   Filed Weights   10/18/19 0136  Weight: 105.2 kg    Examination: Constitutional: NAD, calm, comfortable HEENT: Pallor.  No jaundice Neck: normal, supple, no masses, no thyromegaly Respiratory: clear to auscultation bilaterally, no wheezing, no crackles. Normal respiratory effort. No accessory muscle use.  Cardiovascular: Regular rate and rhythm, no murmurs / rubs / gallops. No extremity edema. 2+ pedal pulses. No carotid bruits.  Abdomen: Obese, soft and nontender.  Organs are difficult to assess. Neurologic: Awake and alert.  Patient moves all extremities.    Data Reviewed:   CBC: Recent Labs  Lab 10/17/19 1436  10/19/19 1326 10/20/19 0331 10/20/19 1226 10/21/19 0343 10/22/19 0256  WBC 0.2*   < > 0.2* 0.2* 0.2* 0.3* 0.4*  NEUTROABS 0.0*  --   --   --   --   --   --   HGB 9.7*   < > 8.2* 7.8* 7.9* 7.9* 7.2*  HCT 27.1*   < > 23.8* 22.7* 22.8* 22.8* 21.6*  MCV 85.5   < > 89.1 88.7 88.0 88.0 88.5  PLT 8*   < > 20* 16* 17* 18* 19*   < > = values in this interval not displayed.   Basic Metabolic Panel: Recent Labs  Lab 10/18/19 0906  10/19/19 0339 10/20/19 0331 10/21/19 0343 10/22/19 0256 10/23/19 0415  NA 140   < > 142 139 138 139 140  K 2.7*   < > 4.4 4.3 3.6 3.4* 3.5  CL 108   < > 112* 109 103 105 107  CO2 23   < > 25 24 25 26 26   GLUCOSE 112*   < > 118* 97 101* 102* 115*  BUN 18   < > 17 17 15 14 14   CREATININE 0.87   < > 0.74 0.75 0.83 0.79 0.81  CALCIUM 7.4*   < > 7.8* 7.9* 8.1* 8.0* 7.8*  MG 1.8  --  1.7 1.7 1.5* 1.6* 1.6*  PHOS 2.7  --   --   --   --   --   --    < > = values in this interval not displayed.   GFR: Estimated Creatinine Clearance: 80.9 mL/min (by C-G formula based on SCr of 0.81 mg/dL). Liver Function Tests: Recent Labs  Lab 10/17/19 1945 10/18/19 0906 10/19/19 0339 10/20/19 0331 10/21/19 0343  AST 11* 9* 9* 9* 9*  ALT 10 10 9 9 11   ALKPHOS 53 46 44 42 47  BILITOT 1.0 1.0 0.7 0.8 1.2  PROT 5.9*  5.6* 5.2* 5.4* 5.5*  ALBUMIN 3.0* 2.8* 2.5* 2.5* 2.6*   No results for input(s): LIPASE, AMYLASE in the last 168 hours. No results for input(s): AMMONIA in the last 168 hours. Coagulation Profile: Recent Labs  Lab 10/17/19 1945  INR 1.3*   Cardiac Enzymes: No results for input(s): CKTOTAL, CKMB, CKMBINDEX, TROPONINI in the last 168 hours. BNP (last 3 results) No results for input(s): PROBNP in the last 8760 hours. HbA1C: No results for input(s): HGBA1C in the last 72 hours. CBG: No results for input(s): GLUCAP in the last 168 hours. Lipid Profile: No results for input(s): CHOL, HDL, LDLCALC, TRIG, CHOLHDL, LDLDIRECT in the last 72 hours. Thyroid Function Tests: No results for input(s): TSH, T4TOTAL, FREET4, T3FREE, THYROIDAB in  the last 72 hours. Anemia Panel: No results for input(s): VITAMINB12, FOLATE, FERRITIN, TIBC, IRON, RETICCTPCT in the last 72 hours. Sepsis Labs: No results for input(s): PROCALCITON, LATICACIDVEN in the last 168 hours.  Recent Results (from the past 240 hour(s))  SARS CORONAVIRUS 2 (TAT 6-24 HRS) Nasopharyngeal Nasopharyngeal Swab     Status: None   Collection Time: 10/17/19  4:16 PM   Specimen: Nasopharyngeal Swab  Result Value Ref Range Status   SARS Coronavirus 2 NEGATIVE NEGATIVE Final    Comment: (NOTE) SARS-CoV-2 target nucleic acids are NOT DETECTED. The SARS-CoV-2 RNA is generally detectable in upper and lower respiratory specimens during the acute phase of infection. Negative results do not preclude SARS-CoV-2 infection, do not rule out co-infections with other pathogens, and should not be used as the sole basis for treatment or other patient management decisions. Negative results must be combined with clinical observations, patient history, and epidemiological information. The expected result is Negative. Fact Sheet for Patients: SugarRoll.be Fact Sheet for Healthcare Providers:  https://www.woods-mathews.com/ This test is not yet approved or cleared by the [place] FDA and  has been authorized for detection and/or diagnosis of SARS-CoV-2 by FDA under an Emergency Use Authorization (EUA). This EUA will remain  in effect (meaning this test can be used) for the duration of the COVID-19 declaration under Section 56 4(b)(1) of the Act, 21 U.S.C. section 360bbb-3(b)(1), unless the authorization is terminated or revoked sooner. Performed at [facility], [place] [address] [place], [place] [address]   Culture, blood (routine x 2)     Status: None (Preliminary result)   Collection Time: 10/22/19  6:36 PM   Specimen: Left Antecubital; Blood  Result Value Ref Range Status   Specimen Description   Final    LEFT ANTECUBITAL Performed at [facility] [address] [place], [place] [address]    Special Requests   Final    BOTTLES DRAWN AEROBIC ONLY Blood Culture adequate volume Performed at [facility] [address], [place], [place] [address]    Culture   Final    NO GROWTH < 12 HOURS Performed at [facility] [address], [place], [place] [address]    Report Status PENDING  Incomplete  Culture, blood (routine x 2)     Status: None (Preliminary result)   Collection Time: 10/22/19  6:36 PM   Specimen: BLOOD LEFT FOREARM  Result Value Ref Range Status   Specimen Description   Final    BLOOD LEFT FOREARM Performed at [facility] [address], [place], [place] [address]    Special Requests   Final    BOTTLES DRAWN AEROBIC ONLY Blood Culture adequate volume Performed at [facility] [address], [place], [place] [address]    Culture   Final    NO GROWTH < 12 HOURS Performed at [facility] [address], [place], [place] [address]    Report Status PENDING  Incomplete         Radiology Studies: Dg Chest 2 View  Result Date:  10/22/2019 CLINICAL DATA:  Neutropenia. Per chart- h/o squamous cell carcinoma of anal margin, COPD, uterine cancer, emphysema, HTN, and former smoker. Presented to the hospital with c/o fatigue, mouth pain secondary to mucositis and spontaneous rectal bleeding after first chemo treatment. EXAM: CHEST - 2 VIEW COMPARISON:  10/12/2019. FINDINGS: Cardiac silhouette is top-normal in size. No mediastinal or hilar masses. No evidence of adenopathy.  Lungs are hyperexpanded but clear. No pleural effusion or pneumothorax. Right-sided PICC has its tip in the lower superior vena cava. Skeletal structures are intact. IMPRESSION: No active cardiopulmonary disease. Electronically Signed   By: Lajean Manes M.D.   On: 10/22/2019 18:07        Scheduled Meds: . atorvastatin  20 mg Oral QODAY  . barrier cream  1 application Topical BID  . Chlorhexidine Gluconate Cloth  6 each Topical Daily  . feeding supplement (ENSURE ENLIVE)  237 mL Oral BID BM  . hydrocortisone  1 application Rectal BID  . levofloxacin  500 mg Oral q1800  . levothyroxine  75 mcg Oral Q0600  . pantoprazole  40 mg Oral Daily  . sertraline  200 mg Oral QHS  . sodium chloride flush  10-40 mL Intracatheter Q12H  . Tbo-filgastrim (GRANIX) SQ  480 mcg Subcutaneous q1800   Continuous Infusions: . sodium chloride 75 mL/hr at 10/22/19 1701     LOS: 6 days   Time spent= 25 mins  Bonnell Public, MD [facility]  If 7PM-7AM, please contact night-coverage  10/23/2019, 10:25 AM

## 2019-10-24 ENCOUNTER — Ambulatory Visit
Admission: RE | Admit: 2019-10-24 | Discharge: 2019-10-24 | Disposition: A | Discharge status: Home / Self Care | Payer: Medicare Other | Source: Ambulatory Visit | Attending: Radiation Oncology | Admitting: Radiation Oncology

## 2019-10-24 DIAGNOSIS — Z510 Encounter for antineoplastic radiation therapy: Secondary | ICD-10-CM | POA: Insufficient documentation

## 2019-10-24 DIAGNOSIS — C210 Malignant neoplasm of anus, unspecified: Secondary | ICD-10-CM | POA: Diagnosis not present

## 2019-10-24 LAB — CBC WITH DIFFERENTIAL/PLATELET
Abs Immature Granulocytes: 0.2 10*3/uL — ABNORMAL HIGH (ref 0.00–0.07)
Band Neutrophils: 20 %
Basophils Absolute: 0 10*3/uL (ref 0.0–0.1)
Basophils Relative: 0 %
Eosinophils Absolute: 0 10*3/uL (ref 0.0–0.5)
Eosinophils Relative: 0 %
HCT: 20.2 % — ABNORMAL LOW (ref 36.0–46.0)
Hemoglobin: 6.8 g/dL — CL (ref 12.0–15.0)
Lymphocytes Relative: 12 %
Lymphs Abs: 0.2 10*3/uL — ABNORMAL LOW (ref 0.7–4.0)
MCH: 30.6 pg (ref 26.0–34.0)
MCHC: 33.7 g/dL (ref 30.0–36.0)
MCV: 91 fL (ref 80.0–100.0)
Metamyelocytes Relative: 3 %
Monocytes Absolute: 0.2 10*3/uL (ref 0.1–1.0)
Monocytes Relative: 10 %
Myelocytes: 5 %
Neutro Abs: 1.4 10*3/uL — ABNORMAL LOW (ref 1.7–7.7)
Neutrophils Relative %: 50 %
Platelets: 37 10*3/uL — ABNORMAL LOW (ref 150–400)
RBC: 2.22 MIL/uL — ABNORMAL LOW (ref 3.87–5.11)
RDW: 11.9 % (ref 11.5–15.5)
WBC: 2 10*3/uL — ABNORMAL LOW (ref 4.0–10.5)
nRBC: 3.1 % — ABNORMAL HIGH (ref 0.0–0.2)

## 2019-10-24 LAB — PREPARE RBC (CROSSMATCH)

## 2019-10-24 LAB — URINE CULTURE: Culture: NO GROWTH

## 2019-10-24 LAB — CREATININE, SERUM
Creatinine, Ser: 0.47 mg/dL (ref 0.44–1.00)
GFR calc Af Amer: >60 mL/min (ref >60)
GFR calc non Af Amer: >60 mL/min (ref >60)

## 2019-10-24 MED ORDER — DIPHENHYDRAMINE HCL 25 MG PO CAPS
25.0000 mg | ORAL_CAPSULE | Freq: Once | ORAL | Status: AC
  Administered 2019-10-24: 25 mg via ORAL
  Filled 2019-10-24: qty 1

## 2019-10-24 MED ORDER — ACETAMINOPHEN 325 MG PO TABS
650.0000 mg | ORAL_TABLET | Freq: Once | ORAL | Status: AC
  Administered 2019-10-24: 650 mg via ORAL
  Filled 2019-10-24: qty 2

## 2019-10-24 MED ORDER — SODIUM CHLORIDE 0.9% IV SOLUTION
Freq: Once | INTRAVENOUS | Status: AC
  Administered 2019-10-24: 17:00:00 via INTRAVENOUS

## 2019-10-24 NOTE — Progress Notes (Signed)
PROGRESS NOTE    DAANYA MONROE  [id number] DOB: December 09, 1946 DOA: 10/17/2019 PCP: Hoyt Koch, MD   Brief Narrative:  73 year old with history of squamous cell carcinoma of anal margin, COPD, uterine cancer, GERD, hyperlipidemia, obstructive sleep apnea, IBS, hypothyroidism, fibromyalgia, chronic lower back pain presented to the hospital with complains of fatigue, mouth pain secondary to mucositis and spontaneous rectal bleeding after first chemo treatment.  Developed severe pancytopenia/neutropenia.  On outpatient chemo and radiation.  Receiving blood transfusion and platelets transfusion.  Empirically patient was started on Levaquin.  Following daily blood counts.  Oncology team following.  Patient seen today, 10/21/2019.  No new complaints.  Oncology PT is highly appreciated.  WBC 0.3 and platelet count is 18.  We will continue current management.  Patient's daughter updated.  10/22/2019: Patient seen.  No new complaint.  WBC 0.4 today and platelet count is 19.  Continue current management. 10/23/2019: Patient seen.  Also discussed extensively with the oncologist, Dr. Lindi Adie.  WBC is 1 today and the platelet count is 28 (both improving).  We will continue current management.  No new changes. 10/24/2019: WBC is 2 today.  Platelet count is 37.  Hemoglobin is 6.8 g/dL.  Oncology input is appreciated.  Granix has been discontinued.  Patient will be transfused with 1 unit of packed red blood cells.  Assessment & Plan:   Active Problems:   Depression   Hypothyroidism   GERD (gastroesophageal reflux disease)   OCD (obsessive compulsive disorder)   COPD GOLD II    Hyperlipidemia   Anal cancer (HCC)   Thrombocytopenia (HCC)   Chemotherapy-induced neutropenia (HCC)  Severe neutropenia/pancytopenia Epistaxis/rectal bleeding, secondary to thrombocytopenia, resolved -Status post chemotherapy.  Status post 2 unit PRBC transfusion.  Monitor platelets and hemoglobin.  If necessary  transfer.  Hold off on transfusion today as patient is not actively bleeding. -Prophylactic continue Levaquin.  Broaden coverage if needed -PICC line -IV Granix -Sitz bath. 10/22/2019: Continue to monitor WBC and platelet count.  Panculture patient.  Urinalysis.  Chest x-ray. 10/23/2019: UA is nonrevealing.  Chest x-ray report has not revealed any significant findings.  Cultures are still pending.  WBC is improving.  WBC is 1 today.  Platelet count is 28.  We will continue current management. 10/24/2019: No documented fever.  Negative.  WBC is 2 today.  Platelet count is 37.  Granix discontinued.  Hemoglobin of 6.8 g/dL.  Patient will transfuse 1 unit of packed red blood cells.  Radiation is also planned.  Nonspecific diarrhea -Noninfectious.  Likely from chemo.  If necessary depression can get antidiarrheals as needed.  Increase oral intake -10/24/2019: Resolved.  Mucositis -Magic mouthwash with lidocaine.  Nystatin  Left lower extremity swelling -Recent Dopplers and CTA are negative.  Hypokalemia -Aggressive repletion -10/22/2019: Potassium today 3.4.  K-Dur 40 M EQ p.o. x1 dose. -10/23/2019: Potassium is 3.5 today.  Atypical chest pain, resolved  History of anal cancer -Currently ongoing radiation and chemotherapy.  On hold -10/24/2019: For radiation later today.  Hypothyroidism -Continue Synthroid. -TSH 2.9  Depression -Continue home meds  Moderate to severe protein calorie malnutrition -Nutrition consulted.  Prealbumin 10.7.  GERD -PPI  History of chronic COPD -Currently stable.  As needed bronchodilators  Hyperlipidemia -Statin  PT recommends-home PT  DVT prophylaxis: SCDs Code Status: Limited code, okay with intubation Family Communication: Daughter Disposition Plan: This will depend on hospital course Consultants:   Oncology  Procedures:   PICC placement by IR 10/27  Antimicrobials:   Empiric Levaquin  Subjective: No new complaints. No  reported fever or chills. No bleeding   Objective: Vitals:   10/23/19 0611 10/23/19 2110 10/24/19 0604 10/24/19 1335  BP: 136/71 128/64 (!) 115/55 102/69  Pulse: 84 82 78 (!) 107  Resp: 19 18 14 16   Temp: 98.4 F (36.9 C) 98.2 F (36.8 C) 98.2 F (36.8 C) 98.2 F (36.8 C)  TempSrc: Oral Oral Oral Oral  SpO2: 96% 93% 94% 94%  Weight:      Height:        Intake/Output Summary (Last 24 hours) at 10/24/2019 1524 Last data filed at 10/24/2019 1500 Gross per 24 hour  Intake 2600.69 ml  Output -  Net 2600.69 ml   Filed Weights   10/18/19 0136  Weight: 105.2 kg    Examination: Constitutional: NAD, calm, comfortable HEENT: Pallor.  No jaundice Neck: normal, supple, no masses, no thyromegaly Respiratory: clear to auscultation bilaterally, no wheezing, no crackles. Normal respiratory effort. No accessory muscle use.  Cardiovascular: Regular rate and rhythm, no murmurs / rubs / gallops. No extremity edema. 2+ pedal pulses. No carotid bruits.  Abdomen: Obese, soft and nontender.  Organs are difficult to assess. Neurologic: Awake and alert.  Patient moves all extremities.    Data Reviewed:   CBC: Recent Labs  Lab 10/20/19 1226 10/21/19 0343 10/22/19 0256 10/23/19 1100 10/24/19 0500  WBC 0.2* 0.3* 0.4* 1.0* 2.0*  NEUTROABS  --   --   --   --  1.4*  HGB 7.9* 7.9* 7.2* 7.2* 6.8*  HCT 22.8* 22.8* 21.6* 21.8* 20.2*  MCV 88.0 88.0 88.5 92.0 91.0  PLT 17* 18* 19* 28* 37*   Basic Metabolic Panel: Recent Labs  Lab 10/18/19 0906  10/19/19 0339 10/20/19 0331 10/21/19 0343 10/22/19 0256 10/23/19 0415 10/24/19 0500  NA 140   < > 142 139 138 139 140  --   K 2.7*   < > 4.4 4.3 3.6 3.4* 3.5  --   CL 108   < > 112* 109 103 105 107  --   CO2 23   < > 25 24 25 26 26   --   GLUCOSE 112*   < > 118* 97 101* 102* 115*  --   BUN 18   < > 17 17 15 14 14   --   CREATININE 0.87   < > 0.74 0.75 0.83 0.79 0.81 0.47  CALCIUM 7.4*   < > 7.8* 7.9* 8.1* 8.0* 7.8*  --   MG 1.8  --  1.7 1.7  1.5* 1.6* 1.6*  --   PHOS 2.7  --   --   --   --   --   --   --    < > = values in this interval not displayed.   GFR: Estimated Creatinine Clearance: 81.9 mL/min (by C-G formula based on SCr of 0.47 mg/dL). Liver Function Tests: Recent Labs  Lab 10/17/19 1945 10/18/19 0906 10/19/19 0339 10/20/19 0331 10/21/19 0343  AST 11* 9* 9* 9* 9*  ALT 10 10 9 9 11   ALKPHOS 53 46 44 42 47  BILITOT 1.0 1.0 0.7 0.8 1.2  PROT 5.9* 5.6* 5.2* 5.4* 5.5*  ALBUMIN 3.0* 2.8* 2.5* 2.5* 2.6*   No results for input(s): LIPASE, AMYLASE in the last 168 hours. No results for input(s): AMMONIA in the last 168 hours. Coagulation Profile: Recent Labs  Lab 10/17/19 1945  INR 1.3*   Cardiac Enzymes: No results for input(s): CKTOTAL, CKMB, CKMBINDEX, TROPONINI in the last 168  hours. BNP (last 3 results) No results for input(s): PROBNP in the last 8760 hours. HbA1C: No results for input(s): HGBA1C in the last 72 hours. CBG: No results for input(s): GLUCAP in the last 168 hours. Lipid Profile: No results for input(s): CHOL, HDL, LDLCALC, TRIG, CHOLHDL, LDLDIRECT in the last 72 hours. Thyroid Function Tests: No results for input(s): TSH, T4TOTAL, FREET4, T3FREE, THYROIDAB in the last 72 hours. Anemia Panel: No results for input(s): VITAMINB12, FOLATE, FERRITIN, TIBC, IRON, RETICCTPCT in the last 72 hours. Sepsis Labs: No results for input(s): PROCALCITON, LATICACIDVEN in the last 168 hours.  Recent Results (from the past 240 hour(s))  SARS CORONAVIRUS 2 (TAT 6-24 HRS) Nasopharyngeal Nasopharyngeal Swab     Status: None   Collection Time: 10/17/19  4:16 PM   Specimen: Nasopharyngeal Swab  Result Value Ref Range Status   SARS Coronavirus 2 NEGATIVE NEGATIVE Final    Comment: (NOTE) SARS-CoV-2 target nucleic acids are NOT DETECTED. The SARS-CoV-2 RNA is generally detectable in upper and lower respiratory specimens during the acute phase of infection. Negative results do not preclude SARS-CoV-2  infection, do not rule out co-infections with other pathogens, and should not be used as the sole basis for treatment or other patient management decisions. Negative results must be combined with clinical observations, patient history, and epidemiological information. The expected result is Negative. Fact Sheet for Patients: SugarRoll.be Fact Sheet for Healthcare Providers: https://www.woods-mathews.com/ This test is not yet approved or cleared by the [place] FDA and  has been authorized for detection and/or diagnosis of SARS-CoV-2 by FDA under an Emergency Use Authorization (EUA). This EUA will remain  in effect (meaning this test can be used) for the duration of the COVID-19 declaration under Section 56 4(b)(1) of the Act, 21 U.S.C. section 360bbb-3(b)(1), unless the authorization is terminated or revoked sooner. Performed at [facility], [place] [address], [place], [place] [address]   Culture, Urine     Status: None   Collection Time: 10/22/19  5:07 PM   Specimen: Urine, Clean Catch  Result Value Ref Range Status   Specimen Description   Final    URINE, CLEAN CATCH Performed at [facility], [place] [address], [place], [place] [address]    Special Requests   Final    Immunocompromised Performed at [facility], [place] [address], [place], [place] [address]    Culture   Final    NO GROWTH Performed at [facility], [place] [address], [place], [place] [address]    Report Status 10/24/2019 FINAL  Final  Culture, blood (routine x 2)     Status: None (Preliminary result)   Collection Time: 10/22/19  6:36 PM   Specimen: BLOOD  Result Value Ref Range Status   Specimen Description   Final    BLOOD LEFT ANTECUBITAL Performed at [facility], [place] [address], [place], [place] [address]    Special Requests   Final    BOTTLES DRAWN AEROBIC ONLY Blood Culture adequate volume Performed at  [place] [address], [place],  [address]    Culture   Final    NO GROWTH 1 DAY Performed at [facility], [place] [address] [place],  [address]    Report Status PENDING  Incomplete  Culture, blood (routine x 2)     Status: None (Preliminary result)   Collection Time: 10/22/19  6:36 PM   Specimen: BLOOD LEFT FOREARM  Result Value Ref  Range Status   Specimen Description   Final    BLOOD LEFT FOREARM Performed at [facility] [address] [place], [place] [address]    Special Requests   Final    BOTTLES DRAWN AEROBIC ONLY Blood Culture adequate volume Performed at [place] [address], [place], [place] [address]    Culture   Final    NO GROWTH 1 DAY Performed at [facility], [place] [address], [place], [place] [address]    Report Status PENDING  Incomplete         Radiology Studies: Dg Chest 2 View  Result Date: 10/22/2019 CLINICAL DATA:  Neutropenia. Per chart- h/o squamous cell carcinoma of anal margin, COPD, uterine cancer, emphysema, HTN, and former smoker. Presented to the hospital with c/o fatigue, mouth pain secondary to mucositis and spontaneous rectal bleeding after first chemo treatment. EXAM: CHEST - 2 VIEW COMPARISON:  10/12/2019. FINDINGS: Cardiac silhouette is top-normal in size. No mediastinal or hilar masses. No evidence of adenopathy. Lungs are hyperexpanded but clear. No pleural effusion or pneumothorax. Right-sided PICC has its tip in the lower superior vena cava. Skeletal structures are intact. IMPRESSION: No active cardiopulmonary disease. Electronically Signed   By: Lajean Manes M.D.   On: 10/22/2019 18:07        Scheduled Meds: . sodium chloride   Intravenous Once  . acetaminophen  650 mg Oral Once  . atorvastatin  20 mg Oral QODAY  . barrier cream  1 application Topical BID  . Chlorhexidine Gluconate Cloth  6 each Topical Daily  . diphenhydrAMINE  25  mg Oral Once  . feeding supplement (ENSURE ENLIVE)  237 mL Oral BID BM  . hydrocortisone  1 application Rectal BID  . levothyroxine  75 mcg Oral Q0600  . pantoprazole  40 mg Oral Daily  . sertraline  200 mg Oral QHS  . sodium chloride flush  10-40 mL Intracatheter Q12H   Continuous Infusions: . sodium chloride 75 mL/hr at 10/24/19 0723     LOS: 7 days   Time spent= 25 mins  Bonnell Public, MD [facility]  If 7PM-7AM, please contact night-coverage  10/24/2019, 3:24 PM

## 2019-10-24 NOTE — Progress Notes (Signed)
OT Cancellation Note  Patient Details Name: Joanne Klein MRN: [id number] DOB: 1946/05/19   Cancelled Treatment:    Reason Eval/Treat Not Completed: Fatigue/lethargy limiting ability to participate despite max encouragement.  [place], Mickel Baas, OT Acute Rehabilitation Services [phone]    10/24/2019, 9:55 AM

## 2019-10-24 NOTE — Care Management Important Message (Signed)
Important Message  Patient Details IM Letter given to Sharren Bridge SW to present to the Patient Name: Joanne Klein MRN: [id number] Date of Birth: [birth year]   Medicare Important Message Given:  Yes     [name] 10/24/2019, 11:01 AM

## 2019-10-24 NOTE — Progress Notes (Signed)
CRITICAL VALUE ALERT  Critical Value:  Hbg 6.8  Date & Time Notied:  10/24/2019 0755  Provider Notified: Marcie Bal  Orders Received/Actions taken: no orders received yet. MD notified. Rn will continue to monitor patient.

## 2019-10-24 NOTE — Progress Notes (Addendum)
HEMATOLOGY-ONCOLOGY PROGRESS NOTE  SUBJECTIVE: Less fatigued this morning.  She is eating well today.  Reports diarrhea has resolved.  She has not noticed any bleeding.  Reports right upper arm pain which started last evening.  Plans to resume radiation later today.  Oncology History  Anal cancer ([place])  09/15/2019 Initial Diagnosis   Anal cancer ([place])   10/03/2019 -  Chemotherapy   The patient had mitoMYcin (MUTAMYCIN) chemo injection 18 mg, 8 mg/m2 = 18 mg (100 % of original dose 8 mg/m2), Intravenous,  Once, 1 of 1 cycle Dose modification: 8 mg/m2 (original dose 8 mg/m2, Cycle 1, Reason: Provider Judgment) Administration: 18 mg (10/03/2019) fluorouracil (ADRUCIL) 8,900 mg in sodium chloride 0.9 % 72 mL chemo infusion, 1,000 mg/m2/day = 8,900 mg, Intravenous, 4D (96 hours ), 1 of 1 cycle Administration: 8,900 mg (10/03/2019)  for chemotherapy treatment.     REVIEW OF SYSTEMS:   As noted in the HPI.  I have reviewed the past medical history, past surgical history, social history and family history with the patient and they are unchanged from previous note.   PHYSICAL EXAMINATION:  Vitals:   10/23/19 2110 10/24/19 0604  BP: 128/64 (!) 115/55  Pulse: 82 78  Resp: 18 14  Temp: 98.2 F (36.8 C) 98.2 F (36.8 C)  SpO2: 93% 94%   Filed Weights   10/18/19 0136  Weight: 231 lb 14.1 oz (105.2 kg)    Intake/Output from previous day: 11/01 0701 - 11/02 0700 In: 1941 [I.V.:1941] Out: -   GENERAL: Resting quietly, no distress and comfortable SKIN: Scattered ecchymoses on arms and legs OROPHARYNX: Mucositis improved, no thrush NECK: supple, thyroid normal size, non-tender, without nodularity LYMPH:  no palpable lymphadenopathy in the cervical, axillary or inguinal LUNGS: clear to auscultation and percussion with normal breathing effort HEART: regular rate & rhythm and no murmurs.  Left lower extremity edema noted. ABDOMEN: abdomen soft, non-tender and normal bowel  sounds Musculoskeletal:no cyanosis of digits and no clubbing  NEURO: alert & oriented x 3 with fluent speech, no focal motor/sensory deficits Rectum: External hemorrhoids, tender at the right perineum, no significant skin breakdown, persistent tumor at the anal verge  LABORATORY DATA:  I have reviewed the data as listed CMP Latest Ref Rng & Units 10/24/2019 10/23/2019 10/22/2019  Glucose 70 - 99 mg/dL - 115(H) 102(H)  BUN 8 - 23 mg/dL - 14 14  Creatinine 0.44 - 1.00 mg/dL 0.47 0.81 0.79  Sodium 135 - 145 mmol/L - 140 139  Potassium 3.5 - 5.1 mmol/L - 3.5 3.4(L)  Chloride 98 - 111 mmol/L - 107 105  CO2 22 - 32 mmol/L - 26 26  Calcium 8.9 - 10.3 mg/dL - 7.8(L) 8.0(L)  Total Protein 6.5 - 8.1 g/dL - - -  Total Bilirubin 0.3 - 1.2 mg/dL - - -  Alkaline Phos 38 - 126 U/L - - -  AST 15 - 41 U/L - - -  ALT 0 - 44 U/L - - -    Lab Results  Component Value Date   WBC 2.0 (L) 10/24/2019   HGB 6.8 (LL) 10/24/2019   HCT 20.2 (L) 10/24/2019   MCV 91.0 10/24/2019   PLT 37 (L) 10/24/2019   NEUTROABS 1.4 (L) 10/24/2019    Dg Chest 2 View  Result Date: 10/22/2019 CLINICAL DATA:  Neutropenia. Per chart- h/o squamous cell carcinoma of anal margin, COPD, uterine cancer, emphysema, HTN, and former smoker. Presented to the hospital with c/o fatigue, mouth pain secondary to mucositis  and spontaneous rectal bleeding after first chemo treatment. EXAM: CHEST - 2 VIEW COMPARISON:  10/12/2019. FINDINGS: Cardiac silhouette is top-normal in size. No mediastinal or hilar masses. No evidence of adenopathy. Lungs are hyperexpanded but clear. No pleural effusion or pneumothorax. Right-sided PICC has its tip in the lower superior vena cava. Skeletal structures are intact. IMPRESSION: No active cardiopulmonary disease. Electronically Signed   By: Lajean Manes M.D.   On: 10/22/2019 18:07   Dg Chest 2 View  Result Date: 10/12/2019 CLINICAL DATA:  Shortness of breath. Leg pain and swelling. Currently undergoing  treatment for anal cancer. EXAM: CHEST - 2 VIEW COMPARISON:  05/26/2014 FINDINGS: The heart size and mediastinal contours are within normal limits. Both lungs are clear. The visualized skeletal structures are unremarkable. IMPRESSION: No active cardiopulmonary disease. Electronically Signed   By: Lorriane Shire M.D.   On: 10/12/2019 19:10   Ct Angio Chest Pe W And/or Wo Contrast  Result Date: 10/12/2019 CLINICAL DATA:  Pt c/o leg pain, swelling, and SOB x 3 days ago. Patient has pain when she's standing on legs. Pt has rectal cancer and is currently receiving treatment for it. ^191m OMNIPAQUE IOHEXOL 350 MG/ML SOLNShortness of breath shortness of breath h/o cancer with active treatment EXAM: CT ANGIOGRAPHY CHEST WITH CONTRAST TECHNIQUE: Multidetector CT imaging of the chest was performed using the standard protocol during bolus administration of intravenous contrast. Multiplanar CT image reconstructions and MIPs were obtained to evaluate the vascular anatomy. CONTRAST:  1078mOMNIPAQUE IOHEXOL 350 MG/ML SOLN COMPARISON:  09/14/2019 FINDINGS: Cardiovascular: No filling defects within the pulmonary arteries to suggest acute pulmonary embolism. No acute findings of the aorta or great vessels. No pericardial fluid. Mediastinum/Nodes: No axillary supraclavicular adenopathy. No mediastinal hilar adenopathy no pericardial effusion. Esophagus normal Lungs/Pleura: No pulmonary infarction. Pneumonia. Centrilobular emphysema the upper lobes. No bronchiectasis. No suspicious nodules. Limited view of the liver, kidneys, pancreas are unremarkable. Normal adrenal glands. Upper Abdomen: Limited view of the liver, kidneys, pancreas are unremarkable. Normal adrenal glands. Musculoskeletal: No aggressive osseous lesion. Review of the MIP images confirms the above findings. IMPRESSION: 1. No evidence acute pulmonary embolism. 2. No acute pulmonary parenchymal findings. Aortic Atherosclerosis (ICD10-I70.0) and Emphysema  (ICD10-J43.9). Electronically Signed   By: StSuzy Bouchard.D.   On: 10/12/2019 20:59   Nm Pet Image Initial (pi) Skull Base To Thigh  Result Date: 09/27/2019 CLINICAL DATA:  Initial treatment strategy for anal squamous cell carcinoma. EXAM: NUCLEAR MEDICINE PET SKULL BASE TO THIGH TECHNIQUE: 12.2 mCi F-18 FDG was injected intravenously. Full-ring PET imaging was performed from the skull base to thigh after the radiotracer. CT data was obtained and used for attenuation correction and anatomic localization. Fasting blood glucose: 100 mg/dl COMPARISON:  09/14/2019 CT scan FINDINGS: Mediastinal blood pool activity: SUV max 2.9 Liver activity: SUV max 4.3 NECK: 1.0 by 0.8 cm left thyroid nodule on image 43/4, with low-grade metabolic activity with maximum SUV 4.1. Incidental CT findings: none CHEST: No significant abnormal hypermetabolic activity in this region. Incidental CT findings: Coronary, aortic arch, and branch vessel atherosclerotic vascular disease. Centrilobular emphysema. ABDOMEN/PELVIS: High-grade activity superficially along the anus with soft tissue prominence in this vicinity, maximum SUV 32.6. Three hypermetabolic left inguinal lymph nodes are present and suspicious for malignant involvement. The more lateral of these measures 1.6 cm in short axis on image 181/4 with maximum SUV 15.2. A left external iliac lymph node measures only 0.7 cm in short axis on image 175/4 with maximum SUV 3.3, only very minimally  above blood pool. There is some scattered accentuated activity in the cecum, maximum SUV 8.8, without CT correlate, likely physiologic. No hypermetabolic hepatic lesion is identified. Incidental CT findings: Cholecystectomy. Old granulomatous disease in the spleen. Aortoiliac atherosclerotic vascular disease. Descending and sigmoid colon diverticulosis. SKELETON: No significant abnormal hypermetabolic activity in this region. Incidental CT findings: Unremarkable IMPRESSION: 1. The anal mass is  highly hypermetabolic with a maximum SUV of 32.6. There are 3 hypermetabolic left inguinal lymph nodes compatible with malignant involvement. A left external iliac node with minimally higher than blood pool activity measuring 0.7 cm in short axis is not entirely specific for malignant involvement, and could be incidental. 2. 1.0 by 0.8 cm left thyroid nodule has activity mildly above background blood pool activity but below liver activity. This is not a definitive hot nodule but is relatively small at 1.0 by 0.8 cm, and has some marginal calcifications. I would suggest thyroid ultrasound for further characterization. 3. No perceptible hypermetabolic activity in the vicinity of the lateral right hepatic lobe lesion seen on the 09/14/2019 CT. Surveillance of the liver may be warranted but this appearance on PET-CT is reassuring that this is likely not a metastatic lesion. 4. Other imaging findings of potential clinical significance: Aortic Atherosclerosis (ICD10-I70.0). Coronary atherosclerosis. Emphysema (ICD10-J43.9). Descending and sigmoid colon diverticulosis. Electronically Signed   By: Van Clines M.D.   On: 09/27/2019 17:19   Vas [place] Lower Extremity Venous (dvt) (only Mc & Wl 7a-7p)  Result Date: 10/13/2019  Lower Venous Study Indications: Swelling, and Edema.  Comparison Study: no prior Performing Technologist: Abram Sander RVS  Examination Guidelines: A complete evaluation includes B-mode imaging, spectral Doppler, color Doppler, and power Doppler as needed of all accessible portions of each vessel. Bilateral testing is considered an integral part of a complete examination. Limited examinations for reoccurring indications may be performed as noted.  +---------+---------------+---------+-----------+----------+--------------+ RIGHT    CompressibilityPhasicitySpontaneityPropertiesThrombus Aging +---------+---------------+---------+-----------+----------+--------------+ CFV      Full            Yes      Yes                                 +---------+---------------+---------+-----------+----------+--------------+ SFJ      Full                                                        +---------+---------------+---------+-----------+----------+--------------+ FV Prox  Full                                                        +---------+---------------+---------+-----------+----------+--------------+ FV Mid   Full                                                        +---------+---------------+---------+-----------+----------+--------------+ FV DistalFull                                                        +---------+---------------+---------+-----------+----------+--------------+  PFV      Full                                                        +---------+---------------+---------+-----------+----------+--------------+ POP      Full           Yes      Yes                                 +---------+---------------+---------+-----------+----------+--------------+ PTV      Full                                                        +---------+---------------+---------+-----------+----------+--------------+ PERO                                                  Not visualized +---------+---------------+---------+-----------+----------+--------------+   +---------+---------------+---------+-----------+----------+--------------+ LEFT     CompressibilityPhasicitySpontaneityPropertiesThrombus Aging +---------+---------------+---------+-----------+----------+--------------+ CFV      Full           Yes      Yes                                 +---------+---------------+---------+-----------+----------+--------------+ SFJ      Full                                                        +---------+---------------+---------+-----------+----------+--------------+ FV Prox  Full                                                         +---------+---------------+---------+-----------+----------+--------------+ FV Mid   Full                                                        +---------+---------------+---------+-----------+----------+--------------+ FV DistalFull                                                        +---------+---------------+---------+-----------+----------+--------------+ PFV      Full                                                        +---------+---------------+---------+-----------+----------+--------------+  POP      Full           Yes      Yes                                 +---------+---------------+---------+-----------+----------+--------------+ PTV      Full                                                        +---------+---------------+---------+-----------+----------+--------------+ PERO                                                  Not visualized +---------+---------------+---------+-----------+----------+--------------+     *See table(s) above for measurements and observations. Electronically signed by Servando Snare MD on 10/13/2019 at 2:16:15 PM.    Final    Ir Picc Placement Right >5 Yrs Inc Img Guide  Result Date: 10/03/2019 INDICATION: History of anal cancer. In need intravenous access for the initiation of chemotherapy. EXAM: ULTRASOUND AND FLUOROSCOPIC GUIDED PICC LINE INSERTION MEDICATIONS: None. CONTRAST:  None FLUOROSCOPY TIME:  1 minute, 18 seconds (6.6 mGy) COMPLICATIONS: None immediate. TECHNIQUE: The procedure, risks, benefits, and alternatives were explained to the patient and informed written consent was obtained. A timeout was performed prior to the initiation of the procedure. The right upper extremity was prepped with chlorhexidine in a sterile fashion, and a sterile drape was applied covering the operative field. Maximum barrier sterile technique with sterile gowns and gloves were used for the procedure. A timeout was performed prior to  the initiation of the procedure. Local anesthesia was provided with 1% lidocaine. Under direct ultrasound guidance, the basilic vein was accessed with a micropuncture kit after the overlying soft tissues were anesthetized with 1% lidocaine. After the overlying soft tissues were anesthetized, a small venotomy incision was created and a micropuncture kit was utilized to access the right basilic vein. Real-time ultrasound guidance was utilized for vascular access including the acquisition of a permanent ultrasound image documenting patency of the accessed vessel. A guidewire was advanced to the level of the superior caval-atrial junction for measurement purposes and the PICC line was cut to length. A peel-away sheath was placed and a 41 cm, 5 [place], single lumen was inserted to level of the superior caval-atrial junction. A post procedure spot fluoroscopic was obtained. The catheter easily aspirated and flushed and was sutured in place. A dressing was placed. The patient tolerated the procedure well without immediate post procedural complication. FINDINGS: After catheter placement, the tip lies within the superior cavoatrial junction. The catheter aspirates and flushes normally and is ready for immediate use. IMPRESSION: Successful ultrasound and fluoroscopic guided placement of a right basilic vein approach, 41 cm, 5 [place], single lumen PICC with tip at the superior caval-atrial junction. The PICC line is ready for immediate use. Electronically Signed   By: Sandi Mariscal M.D.   On: 10/03/2019 10:19   [place] Ekg Site Rite  Result Date: 10/18/2019 If Site Rite image not attached, placement could not be confirmed due to current cardiac rhythm.   ASSESSMENT AND PLAN: 1. 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 Biopsy  08/30/2019 confirmed well-differentiated squamous cell carcinoma with positive P 16 and p63 stains ? CTs 09/14/2019-abnormal soft tissue fullness at the lower anus/perianal soft tissues, asymmetric  left inguinal lymphadenopathy, borderline enlarged left external iliac node, mild stranding and cutaneous thickening of the left gluteal fold, 2 to 3 mm pulmonary nodules, 1.6 cm right hepatic lesion-likely a hemangioma ? Began concurrent chemoRT with 5FU/mitomycin on 10/03/2019 2. COPD 3. Fibromyalgia 4. Chronic back pain 5. Hypothyroid 6. Depression 7. History of uterine cancer-age 4 8. Pain secondary to #1 9. Orthostatic hypotension, fatigue, dyspnea on exertion, early mucositis requiring supportive care on day 9, secondary to chemotherapy  10. Thrombocytopenia PLT 58K and neutropenia ANC 1.4 on day 9, secondary to chemotherapy 11. Worsening cytopenias, PLT 8K and ANC 0.0 on day 15, secondary to chemotherapy. Started prophylaxis with cipro on 10/22 day 11 12.  Hospital admission 10/17/2019 -fatigue, pancytopenia, hypokalemia  Joanne Klein's counts are slowly improving.  Her total WBC is 2.0 with an ANC of 1.4 and her platelets are up to 37,000.  She remains anemic this morning with a hemoglobin of 6.8.  Pain and rectal pain have improved.  Diarrhea has resolved.  She is now eating better.  Recommendations: 1.  Discontinue Granix. 2.  Transfuse 1 unit PRBCs today. 3.  She will resume radiation later today. 4.  Continue pain medication as needed. 5.  Repeat CBC with differential in the morning.  Anticipate that she may be able to be discharged tomorrow if her counts remain stable or continue to improve, pain is controlled, and continues to have good oral intake.  We will plan to leave PICC line in place upon discharge.  Would ask for the inpatient nurses to perform a dressing change and flush her PICC line when she is discharged.  We will set up outpatient flush for the PICC line at the end of this week.  We will arrange for outpatient follow-up at the cancer center early next week for consideration of her next dose of chemotherapy.  She will need dose reduction of her chemotherapy moving  forward.    LOS: 7 days   Mikey Bussing, DNP, AGPCNP-BC, AOCNP 10/24/19 Joanne Klein was interviewed and examined.  The mouth pain has improved, but she continues to have pain at the perineum with tenderness at the right perineum.  The neutrophils and platelets are recovering.  We will discontinue G-CSF therapy.  The right arm pain may be related to G-CSF.  The plan is to resume radiation.  We will see her on 10/31/2019 and decide on resuming chemotherapy.  I recommend leaving the PICC in place at discharge.  I will discontinue Levaquin prophylaxis.

## 2019-10-25 ENCOUNTER — Other Ambulatory Visit: Payer: Self-pay | Admitting: *Deleted

## 2019-10-25 ENCOUNTER — Telehealth: Payer: Self-pay | Admitting: Nurse Practitioner

## 2019-10-25 ENCOUNTER — Ambulatory Visit
Admission: RE | Admit: 2019-10-25 | Discharge: 2019-10-25 | Disposition: A | Discharge status: Home / Self Care | Payer: Medicare Other | Source: Ambulatory Visit | Attending: Radiation Oncology | Admitting: Radiation Oncology

## 2019-10-25 DIAGNOSIS — C210 Malignant neoplasm of anus, unspecified: Secondary | ICD-10-CM | POA: Diagnosis not present

## 2019-10-25 LAB — TYPE AND SCREEN
ABO/RH(D): A NEG
Antibody Screen: NEGATIVE
Unit division: 0

## 2019-10-25 LAB — CBC WITH DIFFERENTIAL/PLATELET
Abs Immature Granulocytes: 0.4 10*3/uL — ABNORMAL HIGH (ref 0.00–0.07)
Band Neutrophils: 7 %
Basophils Absolute: 0 10*3/uL (ref 0.0–0.1)
Basophils Relative: 0 %
Eosinophils Absolute: 0 10*3/uL (ref 0.0–0.5)
Eosinophils Relative: 0 %
HCT: 24.8 % — ABNORMAL LOW (ref 36.0–46.0)
Hemoglobin: 8.3 g/dL — ABNORMAL LOW (ref 12.0–15.0)
Lymphocytes Relative: 11 %
Lymphs Abs: 0.5 10*3/uL — ABNORMAL LOW (ref 0.7–4.0)
MCH: 30.2 pg (ref 26.0–34.0)
MCHC: 33.5 g/dL (ref 30.0–36.0)
MCV: 90.2 fL (ref 80.0–100.0)
Metamyelocytes Relative: 3 %
Monocytes Absolute: 0.2 10*3/uL (ref 0.1–1.0)
Monocytes Relative: 5 %
Myelocytes: 6 %
Neutro Abs: 3.4 10*3/uL (ref 1.7–7.7)
Neutrophils Relative %: 68 %
Platelets: 45 10*3/uL — ABNORMAL LOW (ref 150–400)
RBC: 2.75 MIL/uL — ABNORMAL LOW (ref 3.87–5.11)
RDW: 12.6 % (ref 11.5–15.5)
WBC: 4.5 10*3/uL (ref 4.0–10.5)
nRBC: 1.3 % — ABNORMAL HIGH (ref 0.0–0.2)

## 2019-10-25 LAB — BPAM RBC
Blood Product Expiration Date: 202011292359
ISSUE DATE / TIME: 202011021721
Unit Type and Rh: 600

## 2019-10-25 LAB — CREATININE, SERUM
Creatinine, Ser: 0.89 mg/dL (ref 0.44–1.00)
GFR calc Af Amer: >60 mL/min (ref >60)
GFR calc non Af Amer: >60 mL/min (ref >60)

## 2019-10-25 MED ORDER — SIMETHICONE 80 MG PO CHEW
160.0000 mg | CHEWABLE_TABLET | Freq: Once | ORAL | Status: AC
  Administered 2019-10-25: 160 mg via ORAL
  Filled 2019-10-25: qty 2

## 2019-10-25 NOTE — Progress Notes (Signed)
Patient lying in bed resting. Dilaudid 1 mg IV given for complaint of oral and buttock pain. Pain scale: 8/10. Denies any concerns with diarrhea. Call bell within reach. Will continue to monitor.

## 2019-10-25 NOTE — Progress Notes (Signed)
Occupational Therapy Treatment and Discharge Patient Details Name: Joanne Klein MRN: [id number] DOB: 15-Nov-1946 Today's Date: 10/25/2019    History of present illness 73 yo female admitted with faigue, rectal pain, pancytopenia, thrombocytopenia. Hx of anal ca-on chemo, fibromyalgia, COPD, uterine ca, chronic back pain   OT comments  Pt overall S- I with ADL activity. Pt does need max encouragement to participate.  Pt does not feel further OT needed.  Explained benefits to pt of being OOB and eating sitting up.  Pt listened and agreed to sit EOb for lunch  Follow Up Recommendations  Home health OT;Supervision/Assistance - 24 hour    Equipment Recommendations  3 in 1 bedside commode           Mobility Bed Mobility Overal bed mobility: Independent                Transfers Overall transfer level: Independent     Sit to Stand: Independent         General transfer comment: [place] ADLs independently    Balance Overall balance assessment: Independent                                         ADL either performed or assessed with clinical judgement   ADL Overall ADL's : Needs assistance/impaired                                       General ADL Comments: Pt overall S- mod I with simple ADL activity.  Pt often wants to stay in bed.  Explained benefits of activity.  NP present for part of OT session. Pt does not feel; she needs further OT.     Vision Baseline Vision/History: No visual deficits            Cognition Arousal/Alertness: Awake/alert Behavior During Therapy: WFL for tasks assessed/performed Overall Cognitive Status: Within Functional Limits for tasks assessed                                                     Pertinent Vitals/ Pain       Pain Assessment: Faces Pain Score: 3  Pain Location: her bottom Pain Descriptors / Indicators: Discomfort Pain Intervention(s): Limited activity within  patient's tolerance     Prior Functioning/Environment              Frequency  Min 2X/week        Progress Toward Goals  OT Goals(current goals can now be found in the care plan section)  Progress towards OT goals: Goals met/education completed, patient discharged from [place] All goals met and education completed, patient discharged from OT services       AM-PAC OT "6 Clicks" Daily Activity     Outcome Measure   Help from another person eating meals?: None Help from another person taking care of personal grooming?: None Help from another person toileting, which includes using toliet, bedpan, or urinal?: None Help from another person bathing (including washing, rinsing, drying)?: None Help from another person to put on and taking off regular upper body clothing?: None Help from another person to put  on and taking off regular lower body clothing?: None 6 Click Score: 24    End of Session Equipment Utilized During Treatment: Rolling walker  OT Visit Diagnosis: Unsteadiness on feet (R26.81);Pain Pain - part of body: (rectum)   Activity Tolerance Patient tolerated treatment well   Patient Left with call bell/phone within reach;in bed   Nurse Communication Mobility status        Time: 2574-9355 OT Time Calculation (min): 17 min  Charges: OT General Charges $OT Visit: 1 Visit OT Treatments $Self Care/Home Management : 8-22 mins  Joanne Klein, [place] [phone] Office- [phone]      Joanne Klein, Edwena Felty D 10/25/2019, 12:32 PM

## 2019-10-25 NOTE — Telephone Encounter (Signed)
Scheduled appt per 11/3 sch message - unable to reach pt - left message with appt date and times

## 2019-10-25 NOTE — Progress Notes (Signed)
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  Patient was admitted with severe pancytopenia/neutropenia.  Neutropenia was managed with Granix.  CBC done today (10/25/2019) revealed WBC of 4.5 and platelet count of 45.  Cultures were nonrevealing.  Chest x-ray is clear.  Patient was on Levaquin empirically.  Patient will be discharged back on tomorrow after radiation therapy.  Assessment & Plan:   Active Problems:   Depression   Hypothyroidism   GERD (gastroesophageal reflux disease)   OCD (obsessive compulsive disorder)   COPD GOLD II    Hyperlipidemia   Anal cancer (HCC)   Thrombocytopenia (HCC)   Chemotherapy-induced neutropenia (HCC)  Severe neutropenia/pancytopenia Epistaxis/rectal bleeding, secondary to thrombocytopenia, resolved -Status post chemotherapy.  Status post 2 unit PRBC transfusion.  Monitor platelets and hemoglobin.  If necessary transfer.  Hold off on transfusion today as patient is not actively bleeding. -Prophylactic continue Levaquin.  Broaden coverage if needed -PICC line -IV Granix -Sitz bath. 10/22/2019: Continue to monitor WBC and platelet count.  Panculture patient.  Urinalysis.  Chest x-ray. 10/23/2019: UA is nonrevealing.  Chest x-ray report has not revealed any significant findings.  Cultures are still pending.  WBC is improving.  WBC is 1 today.  Platelet count is 28.  We will continue current management. 10/24/2019: No documented fever.  Negative.  WBC is 2 today.  Platelet count is 37.  Granix discontinued.   Hemoglobin of 6.8 g/dL.  Patient will transfuse 1 unit of packed red blood cells.  Radiation is also planned. 10/25/2019: WBC today is 4.5 and platelet count is 45.  Patient will be discharged back on tomorrow after radiation therapy.  Nonspecific diarrhea -Noninfectious.  Likely from chemo.  If necessary depression can get antidiarrheals as needed.  Increase oral intake -10/24/2019: Resolved.  Mucositis -Magic mouthwash with lidocaine.  Nystatin  Left lower extremity swelling -Recent Dopplers and CTA are negative.  Hypokalemia -Continue to monitor and replete.   -Potassium checked on 10/23/2019 was 3.5.    Atypical chest pain, resolved  History of anal cancer -Currently ongoing radiation and chemotherapy.  On hold -10/24/2019: For radiation tomorrow.  Hypothyroidism -Continue Synthroid. -TSH 2.9  Depression -Continue home meds  Moderate to severe protein calorie malnutrition -Nutrition consulted.  Prealbumin 10.7.  GERD -PPI  History of chronic COPD -Currently stable.  As needed bronchodilators  Hyperlipidemia -Statin  PT recommends-home PT  DVT prophylaxis: SCDs Code Status: Limited code, okay with intubation Family Communication: Daughter Disposition Plan: This will depend on hospital course Consultants:   Oncology  Procedures:   PICC placement by IR 10/27  Antimicrobials:   Empiric Levaquin   Subjective: No new complaints. No reported fever or chills. No bleeding   Objective: Vitals:   10/24/19 1726 10/24/19 1742 10/24/19 1957 10/25/19 0503  BP: 138/83 134/74 (!) 136/95 (!) 159/88  Pulse: 96 92 87 84  Resp: 20 18 16 16   Temp: 98 F (36.7 C) 98.5 F (36.9 C) 98.6 F (37 C) 98.4 F (36.9 C)  TempSrc: Oral Oral Oral   SpO2: 94% 96% 96% 96%  Weight:      Height:  Intake/Output Summary (Last 24 hours) at 10/25/2019 1356 Last data filed at 10/25/2019 1104 Gross per 24 hour  Intake 2067.8 ml  Output -  Net 2067.8 ml   Filed  Weights   10/18/19 0136  Weight: 105.2 kg    Examination: Constitutional: NAD, calm, comfortable HEENT: Pallor.  No jaundice Neck: normal, supple, no masses, no thyromegaly Respiratory: clear to auscultation bilaterally, no wheezing, no crackles. Normal respiratory effort. No accessory muscle use.  Cardiovascular: Regular rate and rhythm, no murmurs / rubs / gallops. No extremity edema. 2+ pedal pulses. No carotid bruits.  Abdomen: Obese, soft and nontender.  Organs are difficult to assess. Neurologic: Awake and alert.  Patient moves all extremities.    Data Reviewed:   CBC: Recent Labs  Lab 10/21/19 0343 10/22/19 0256 10/23/19 1100 10/24/19 0500 10/25/19 0404  WBC 0.3* 0.4* 1.0* 2.0* 4.5  NEUTROABS  --   --   --  1.4* 3.4  HGB 7.9* 7.2* 7.2* 6.8* 8.3*  HCT 22.8* 21.6* 21.8* 20.2* 24.8*  MCV 88.0 88.5 92.0 91.0 90.2  PLT 18* 19* 28* 37* 45*   Basic Metabolic Panel: Recent Labs  Lab 10/19/19 0339 10/20/19 0331 10/21/19 0343 10/22/19 0256 10/23/19 0415 10/24/19 0500 10/25/19 0404  NA 142 139 138 139 140  --   --   K 4.4 4.3 3.6 3.4* 3.5  --   --   CL 112* 109 103 105 107  --   --   CO2 25 24 25 26 26   --   --   GLUCOSE 118* 97 101* 102* 115*  --   --   BUN 17 17 15 14 14   --   --   CREATININE 0.74 0.75 0.83 0.79 0.81 0.47 0.89  CALCIUM 7.8* 7.9* 8.1* 8.0* 7.8*  --   --   MG 1.7 1.7 1.5* 1.6* 1.6*  --   --    GFR: Estimated Creatinine Clearance: 73.6 mL/min (by C-G formula based on SCr of 0.89 mg/dL). Liver Function Tests: Recent Labs  Lab 10/19/19 0339 10/20/19 0331 10/21/19 0343  AST 9* 9* 9*  ALT 9 9 11   ALKPHOS 44 42 47  BILITOT 0.7 0.8 1.2  PROT 5.2* 5.4* 5.5*  ALBUMIN 2.5* 2.5* 2.6*   No results for input(s): LIPASE, AMYLASE in the last 168 hours. No results for input(s): AMMONIA in the last 168 hours. Coagulation Profile: No results for input(s): INR, PROTIME in the last 168 hours. Cardiac Enzymes: No results for input(s): CKTOTAL, CKMB,  CKMBINDEX, TROPONINI in the last 168 hours. BNP (last 3 results) No results for input(s): PROBNP in the last 8760 hours. HbA1C: No results for input(s): HGBA1C in the last 72 hours. CBG: No results for input(s): GLUCAP in the last 168 hours. Lipid Profile: No results for input(s): CHOL, HDL, LDLCALC, TRIG, CHOLHDL, LDLDIRECT in the last 72 hours. Thyroid Function Tests: No results for input(s): TSH, T4TOTAL, FREET4, T3FREE, THYROIDAB in the last 72 hours. Anemia Panel: No results for input(s): VITAMINB12, FOLATE, FERRITIN, TIBC, IRON, RETICCTPCT in the last 72 hours. Sepsis Labs: No results for input(s): PROCALCITON, LATICACIDVEN in the last 168 hours.  Recent Results (from the past 240 hour(s))  SARS CORONAVIRUS 2 (TAT 6-24 HRS) Nasopharyngeal Nasopharyngeal Swab     Status: None   Collection Time: 10/17/19  4:16 PM   Specimen: Nasopharyngeal Swab  Result Value Ref Range Status   SARS Coronavirus 2 NEGATIVE NEGATIVE Final    Comment: (NOTE) SARS-CoV-2 target nucleic acids are NOT  DETECTED. The SARS-CoV-2 RNA is generally detectable in upper and lower respiratory specimens during the acute phase of infection. Negative results do not preclude SARS-CoV-2 infection, do not rule out co-infections with other pathogens, and should not be used as the sole basis for treatment or other patient management decisions. Negative results must be combined with clinical observations, patient history, and epidemiological information. The expected result is Negative. Fact Sheet for Patients: SugarRoll.be Fact Sheet for Healthcare Providers: https://www.woods-mathews.com/ This test is not yet approved or cleared by the [place] FDA and  has been authorized for detection and/or diagnosis of SARS-CoV-2 by FDA under an Emergency Use Authorization (EUA). This EUA will remain  in effect (meaning this test can be used) for the duration of the COVID-19  declaration under Section 56 4(b)(1) of the Act, 21 U.S.C. section 360bbb-3(b)(1), unless the authorization is terminated or revoked sooner. Performed at [facility], [place] [address], [place], [place] [address]   Culture, Urine     Status: None   Collection Time: 10/22/19  5:07 PM   Specimen: Urine, Clean Catch  Result Value Ref Range Status   Specimen Description   Final    URINE, CLEAN CATCH Performed at [facility], [place] [address], [place], [place] [address]    Special Requests   Final    Immunocompromised Performed at [facility], [place] [address], [place], [place] [address]    Culture   Final    NO GROWTH Performed at [facility], [place] [address] [place], [place] [address]    Report Status 10/24/2019 FINAL  Final  Culture, blood (routine x 2)     Status: None (Preliminary result)   Collection Time: 10/22/19  6:36 PM   Specimen: BLOOD  Result Value Ref Range Status   Specimen Description   Final    BLOOD LEFT ANTECUBITAL Performed at [facility], [place] [address], [place], [place] [address]    Special Requests   Final    BOTTLES DRAWN AEROBIC ONLY Blood Culture adequate volume Performed at [place] [address] [place], [place] [address]    Culture   Final    NO GROWTH 2 DAYS Performed at [place] [address], [place], [place] [address]    Report Status PENDING  Incomplete  Culture, blood (routine x 2)     Status: None (Preliminary result)   Collection Time: 10/22/19  6:36 PM   Specimen: BLOOD LEFT FOREARM  Result Value Ref Range Status   Specimen Description   Final    BLOOD LEFT FOREARM Performed at [place] [address], [place], [place] [address]    Special Requests   Final    BOTTLES DRAWN AEROBIC ONLY Blood Culture adequate volume Performed at [place] [address] [place], [place] [address]    Culture    Final    NO GROWTH 2 DAYS Performed at [place] [address], [place], [place] [address]    Report Status PENDING  Incomplete         Radiology Studies: No results found.      Scheduled Meds: . atorvastatin  20 mg Oral QODAY  . barrier cream  1 application Topical BID  . Chlorhexidine Gluconate Cloth  6 each Topical Daily  . hydrocortisone  1 application Rectal BID  . levothyroxine  75 mcg Oral Q0600  . pantoprazole  40 mg  Oral Daily  . sertraline  200 mg Oral QHS  . sodium chloride flush  10-40 mL Intracatheter Q12H   Continuous Infusions: . sodium chloride 75 mL/hr at 10/24/19 0723     LOS: 8 days   Time spent= 25 mins  Bonnell Public, MD [facility]  If 7PM-7AM, please contact night-coverage  10/25/2019, 1:56 PM

## 2019-10-25 NOTE — Discharge Instructions (Signed)
[facility] Stay Proper nutrition can help your body recover from illness and injury.   Foods and beverages high in protein, vitamins, and minerals help rebuild muscle loss, promote healing, & reduce fall risk.   In addition to eating healthy foods, a nutrition shake is an easy, delicious way to get the nutrition you need during and after your hospital stay  It is recommended that you continue to drink 2 bottles per day of:       Any protein supplement for at least 1 month (30 days) after your hospital stay   Tips for adding a nutrition shake into your routine: As allowed, drink one with vitamins or medications instead of water or juice Enjoy one as a tasty mid-morning or afternoon snack Drink cold or make a milkshake out of it Drink one instead of milk with cereal or snacks Use as a coffee creamer   Available at the following grocery stores and pharmacies:           * [facility] [phone]            For COUPONS visit: www.ensure.com/join or http://dawson-may.com/   Suggested Substitutions Ensure Plus = Boost Plus = Carnation Breakfast Essentials = Boost Compact Ensure Active Clear = Boost Breeze Glucerna Shake = Boost Glucose Control = Carnation Breakfast Essentials SUGAR FREE Ensure Max = Premier Protein

## 2019-10-25 NOTE — Progress Notes (Addendum)
HEMATOLOGY-ONCOLOGY PROGRESS NOTE  SUBJECTIVE: States that she was up all night secondary to the sensation of feeling like she had a bubble in her chest.  Symptoms have now resolved.  Reports that she is eating better.  The pain in her mouth and rectal area are improved.  She denies diarrhea.  Denies bleeding.  Resumed radiation on 10/24/2019.  Oncology History  Anal cancer ([place])  09/15/2019 Initial Diagnosis   Anal cancer ([place])   10/03/2019 -  Chemotherapy   The patient had mitoMYcin (MUTAMYCIN) chemo injection 18 mg, 8 mg/m2 = 18 mg (100 % of original dose 8 mg/m2), Intravenous,  Once, 1 of 1 cycle Dose modification: 8 mg/m2 (original dose 8 mg/m2, Cycle 1, Reason: Provider Judgment) Administration: 18 mg (10/03/2019) fluorouracil (ADRUCIL) 8,900 mg in sodium chloride 0.9 % 72 mL chemo infusion, 1,000 mg/m2/day = 8,900 mg, Intravenous, 4D (96 hours ), 1 of 1 cycle Administration: 8,900 mg (10/03/2019)  for chemotherapy treatment.     REVIEW OF SYSTEMS:   As noted in the HPI.  I have reviewed the past medical history, past surgical history, social history and family history with the patient and they are unchanged from previous note.   PHYSICAL EXAMINATION:  Vitals:   10/24/19 1957 10/25/19 0503  BP: (!) 136/95 (!) 159/88  Pulse: 87 84  Resp: 16 16  Temp: 98.6 F (37 C) 98.4 F (36.9 C)  SpO2: 96% 96%   Filed Weights   10/18/19 0136  Weight: 231 lb 14.1 oz (105.2 kg)    Intake/Output from previous day: 11/02 0701 - 11/03 0700 In: 1652.8 [P.O.:415; I.V.:1237.8] Out: -   GENERAL: Resting quietly, no distress and comfortable SKIN: Scattered ecchymoses on arms and legs OROPHARYNX: Mucositis improved, no thrush NECK: supple, thyroid normal size, non-tender, without nodularity LYMPH:  no palpable lymphadenopathy in the cervical, axillary or inguinal LUNGS: clear to auscultation and percussion with normal breathing effort HEART: regular rate & rhythm and no murmurs.  Left  lower extremity edema noted. ABDOMEN: abdomen soft, non-tender and normal bowel sounds Musculoskeletal:no cyanosis of digits and no clubbing  NEURO: alert & oriented x 3 with fluent speech, no focal motor/sensory deficits Rectum: External hemorrhoids, tender at the right perineum, no significant skin breakdown, persistent tumor at the anal verge  LABORATORY DATA:  I have reviewed the data as listed CMP Latest Ref Rng & Units 10/25/2019 10/24/2019 10/23/2019  Glucose 70 - 99 mg/dL - - 115(H)  BUN 8 - 23 mg/dL - - 14  Creatinine 0.44 - 1.00 mg/dL 0.89 0.47 0.81  Sodium 135 - 145 mmol/L - - 140  Potassium 3.5 - 5.1 mmol/L - - 3.5  Chloride 98 - 111 mmol/L - - 107  CO2 22 - 32 mmol/L - - 26  Calcium 8.9 - 10.3 mg/dL - - 7.8(L)  Total Protein 6.5 - 8.1 g/dL - - -  Total Bilirubin 0.3 - 1.2 mg/dL - - -  Alkaline Phos 38 - 126 U/L - - -  AST 15 - 41 U/L - - -  ALT 0 - 44 U/L - - -    Lab Results  Component Value Date   WBC 4.5 10/25/2019   HGB 8.3 (L) 10/25/2019   HCT 24.8 (L) 10/25/2019   MCV 90.2 10/25/2019   PLT 45 (L) 10/25/2019   NEUTROABS 3.4 10/25/2019    Dg Chest 2 View  Result Date: 10/22/2019 CLINICAL DATA:  Neutropenia. Per chart- h/o squamous cell carcinoma of anal margin, COPD, uterine cancer,  emphysema, HTN, and former smoker. Presented to the hospital with c/o fatigue, mouth pain secondary to mucositis and spontaneous rectal bleeding after first chemo treatment. EXAM: CHEST - 2 VIEW COMPARISON:  10/12/2019. FINDINGS: Cardiac silhouette is top-normal in size. No mediastinal or hilar masses. No evidence of adenopathy. Lungs are hyperexpanded but clear. No pleural effusion or pneumothorax. Right-sided PICC has its tip in the lower superior vena cava. Skeletal structures are intact. IMPRESSION: No active cardiopulmonary disease. Electronically Signed   By: Lajean Manes M.D.   On: 10/22/2019 18:07   Dg Chest 2 View  Result Date: 10/12/2019 CLINICAL DATA:  Shortness of  breath. Leg pain and swelling. Currently undergoing treatment for anal cancer. EXAM: CHEST - 2 VIEW COMPARISON:  05/26/2014 FINDINGS: The heart size and mediastinal contours are within normal limits. Both lungs are clear. The visualized skeletal structures are unremarkable. IMPRESSION: No active cardiopulmonary disease. Electronically Signed   By: Lorriane Shire M.D.   On: 10/12/2019 19:10   Ct Angio Chest Pe W And/or Wo Contrast  Result Date: 10/12/2019 CLINICAL DATA:  Pt c/o leg pain, swelling, and SOB x 3 days ago. Patient has pain when she's standing on legs. Pt has rectal cancer and is currently receiving treatment for it. ^146m OMNIPAQUE IOHEXOL 350 MG/ML SOLNShortness of breath shortness of breath h/o cancer with active treatment EXAM: CT ANGIOGRAPHY CHEST WITH CONTRAST TECHNIQUE: Multidetector CT imaging of the chest was performed using the standard protocol during bolus administration of intravenous contrast. Multiplanar CT image reconstructions and MIPs were obtained to evaluate the vascular anatomy. CONTRAST:  1019mOMNIPAQUE IOHEXOL 350 MG/ML SOLN COMPARISON:  09/14/2019 FINDINGS: Cardiovascular: No filling defects within the pulmonary arteries to suggest acute pulmonary embolism. No acute findings of the aorta or great vessels. No pericardial fluid. Mediastinum/Nodes: No axillary supraclavicular adenopathy. No mediastinal hilar adenopathy no pericardial effusion. Esophagus normal Lungs/Pleura: No pulmonary infarction. Pneumonia. Centrilobular emphysema the upper lobes. No bronchiectasis. No suspicious nodules. Limited view of the liver, kidneys, pancreas are unremarkable. Normal adrenal glands. Upper Abdomen: Limited view of the liver, kidneys, pancreas are unremarkable. Normal adrenal glands. Musculoskeletal: No aggressive osseous lesion. Review of the MIP images confirms the above findings. IMPRESSION: 1. No evidence acute pulmonary embolism. 2. No acute pulmonary parenchymal findings. Aortic  Atherosclerosis (ICD10-I70.0) and Emphysema (ICD10-J43.9). Electronically Signed   By: StSuzy Bouchard.D.   On: 10/12/2019 20:59   Nm Pet Image Initial (pi) Skull Base To Thigh  Result Date: 09/27/2019 CLINICAL DATA:  Initial treatment strategy for anal squamous cell carcinoma. EXAM: NUCLEAR MEDICINE PET SKULL BASE TO THIGH TECHNIQUE: 12.2 mCi F-18 FDG was injected intravenously. Full-ring PET imaging was performed from the skull base to thigh after the radiotracer. CT data was obtained and used for attenuation correction and anatomic localization. Fasting blood glucose: 100 mg/dl COMPARISON:  09/14/2019 CT scan FINDINGS: Mediastinal blood pool activity: SUV max 2.9 Liver activity: SUV max 4.3 NECK: 1.0 by 0.8 cm left thyroid nodule on image 43/4, with low-grade metabolic activity with maximum SUV 4.1. Incidental CT findings: none CHEST: No significant abnormal hypermetabolic activity in this region. Incidental CT findings: Coronary, aortic arch, and branch vessel atherosclerotic vascular disease. Centrilobular emphysema. ABDOMEN/PELVIS: High-grade activity superficially along the anus with soft tissue prominence in this vicinity, maximum SUV 32.6. Three hypermetabolic left inguinal lymph nodes are present and suspicious for malignant involvement. The more lateral of these measures 1.6 cm in short axis on image 181/4 with maximum SUV 15.2. A left external iliac lymph node  measures only 0.7 cm in short axis on image 175/4 with maximum SUV 3.3, only very minimally above blood pool. There is some scattered accentuated activity in the cecum, maximum SUV 8.8, without CT correlate, likely physiologic. No hypermetabolic hepatic lesion is identified. Incidental CT findings: Cholecystectomy. Old granulomatous disease in the spleen. Aortoiliac atherosclerotic vascular disease. Descending and sigmoid colon diverticulosis. SKELETON: No significant abnormal hypermetabolic activity in this region. Incidental CT findings:  Unremarkable IMPRESSION: 1. The anal mass is highly hypermetabolic with a maximum SUV of 32.6. There are 3 hypermetabolic left inguinal lymph nodes compatible with malignant involvement. A left external iliac node with minimally higher than blood pool activity measuring 0.7 cm in short axis is not entirely specific for malignant involvement, and could be incidental. 2. 1.0 by 0.8 cm left thyroid nodule has activity mildly above background blood pool activity but below liver activity. This is not a definitive hot nodule but is relatively small at 1.0 by 0.8 cm, and has some marginal calcifications. I would suggest thyroid ultrasound for further characterization. 3. No perceptible hypermetabolic activity in the vicinity of the lateral right hepatic lobe lesion seen on the 09/14/2019 CT. Surveillance of the liver may be warranted but this appearance on PET-CT is reassuring that this is likely not a metastatic lesion. 4. Other imaging findings of potential clinical significance: Aortic Atherosclerosis (ICD10-I70.0). Coronary atherosclerosis. Emphysema (ICD10-J43.9). Descending and sigmoid colon diverticulosis. Electronically Signed   By: Van Clines M.D.   On: 09/27/2019 17:19   Vas [place] Lower Extremity Venous (dvt) (only Mc & Wl 7a-7p)  Result Date: 10/13/2019  Lower Venous Study Indications: Swelling, and Edema.  Comparison Study: no prior Performing Technologist: Abram Sander RVS  Examination Guidelines: A complete evaluation includes B-mode imaging, spectral Doppler, color Doppler, and power Doppler as needed of all accessible portions of each vessel. Bilateral testing is considered an integral part of a complete examination. Limited examinations for reoccurring indications may be performed as noted.  +---------+---------------+---------+-----------+----------+--------------+ RIGHT    CompressibilityPhasicitySpontaneityPropertiesThrombus Aging  +---------+---------------+---------+-----------+----------+--------------+ CFV      Full           Yes      Yes                                 +---------+---------------+---------+-----------+----------+--------------+ SFJ      Full                                                        +---------+---------------+---------+-----------+----------+--------------+ FV Prox  Full                                                        +---------+---------------+---------+-----------+----------+--------------+ FV Mid   Full                                                        +---------+---------------+---------+-----------+----------+--------------+ FV DistalFull                                                        +---------+---------------+---------+-----------+----------+--------------+  PFV      Full                                                        +---------+---------------+---------+-----------+----------+--------------+ POP      Full           Yes      Yes                                 +---------+---------------+---------+-----------+----------+--------------+ PTV      Full                                                        +---------+---------------+---------+-----------+----------+--------------+ PERO                                                  Not visualized +---------+---------------+---------+-----------+----------+--------------+   +---------+---------------+---------+-----------+----------+--------------+ LEFT     CompressibilityPhasicitySpontaneityPropertiesThrombus Aging +---------+---------------+---------+-----------+----------+--------------+ CFV      Full           Yes      Yes                                 +---------+---------------+---------+-----------+----------+--------------+ SFJ      Full                                                         +---------+---------------+---------+-----------+----------+--------------+ FV Prox  Full                                                        +---------+---------------+---------+-----------+----------+--------------+ FV Mid   Full                                                        +---------+---------------+---------+-----------+----------+--------------+ FV DistalFull                                                        +---------+---------------+---------+-----------+----------+--------------+ PFV      Full                                                        +---------+---------------+---------+-----------+----------+--------------+  POP      Full           Yes      Yes                                 +---------+---------------+---------+-----------+----------+--------------+ PTV      Full                                                        +---------+---------------+---------+-----------+----------+--------------+ PERO                                                  Not visualized +---------+---------------+---------+-----------+----------+--------------+     *See table(s) above for measurements and observations. Electronically signed by Servando Snare MD on 10/13/2019 at 2:16:15 PM.    Final    Ir Picc Placement Right >5 Yrs Inc Img Guide  Result Date: 10/03/2019 INDICATION: History of anal cancer. In need intravenous access for the initiation of chemotherapy. EXAM: ULTRASOUND AND FLUOROSCOPIC GUIDED PICC LINE INSERTION MEDICATIONS: None. CONTRAST:  None FLUOROSCOPY TIME:  1 minute, 18 seconds (6.6 mGy) COMPLICATIONS: None immediate. TECHNIQUE: The procedure, risks, benefits, and alternatives were explained to the patient and informed written consent was obtained. A timeout was performed prior to the initiation of the procedure. The right upper extremity was prepped with chlorhexidine in a sterile fashion, and a sterile drape was applied  covering the operative field. Maximum barrier sterile technique with sterile gowns and gloves were used for the procedure. A timeout was performed prior to the initiation of the procedure. Local anesthesia was provided with 1% lidocaine. Under direct ultrasound guidance, the basilic vein was accessed with a micropuncture kit after the overlying soft tissues were anesthetized with 1% lidocaine. After the overlying soft tissues were anesthetized, a small venotomy incision was created and a micropuncture kit was utilized to access the right basilic vein. Real-time ultrasound guidance was utilized for vascular access including the acquisition of a permanent ultrasound image documenting patency of the accessed vessel. A guidewire was advanced to the level of the superior caval-atrial junction for measurement purposes and the PICC line was cut to length. A peel-away sheath was placed and a 41 cm, 5 [place], single lumen was inserted to level of the superior caval-atrial junction. A post procedure spot fluoroscopic was obtained. The catheter easily aspirated and flushed and was sutured in place. A dressing was placed. The patient tolerated the procedure well without immediate post procedural complication. FINDINGS: After catheter placement, the tip lies within the superior cavoatrial junction. The catheter aspirates and flushes normally and is ready for immediate use. IMPRESSION: Successful ultrasound and fluoroscopic guided placement of a right basilic vein approach, 41 cm, 5 [place], single lumen PICC with tip at the superior caval-atrial junction. The PICC line is ready for immediate use. Electronically Signed   By: Sandi Mariscal M.D.   On: 10/03/2019 10:19   [place] Ekg Site Rite  Result Date: 10/18/2019 If Site Rite image not attached, placement could not be confirmed due to current cardiac rhythm.   ASSESSMENT AND PLAN: 1. 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 Biopsy  08/30/2019 confirmed well-differentiated  squamous cell carcinoma with positive P 16 and p63 stains ? CTs 09/14/2019-abnormal soft tissue fullness at the lower anus/perianal soft tissues, asymmetric left inguinal lymphadenopathy, borderline enlarged left external iliac node, mild stranding and cutaneous thickening of the left gluteal fold, 2 to 3 mm pulmonary nodules, 1.6 cm right hepatic lesion-likely a hemangioma ? Began concurrent chemoRT with 5FU/mitomycin on 10/03/2019 2. COPD 3. Fibromyalgia 4. Chronic back pain 5. Hypothyroid 6. Depression 7. History of uterine cancer-age 64 8. Pain secondary to #1 9. Orthostatic hypotension, fatigue, dyspnea on exertion, early mucositis requiring supportive care on day 9, secondary to chemotherapy  10. Thrombocytopenia PLT 58K and neutropenia ANC 1.4 on day 9, secondary to chemotherapy 11. Worsening cytopenias, PLT 8K and ANC 0.0 on day 15, secondary to chemotherapy. Started prophylaxis with cipro on 10/22 day 11 12.  Hospital admission 10/17/2019 -fatigue, pancytopenia, hypokalemia  Ms. Donner's counts continue to improve.  She is off growth factor support for her WBC.  Received 1 unit PRBCs on 10/24/2019 with improvement of her hemoglobin.  Pain and rectal pain have improved.  Diarrhea has resolved.  She is now eating better.  Recommendations: 1.  Continue to encourage the patient to get out of bed to the chair and ambulate. 2.  Continue radiation under the care of Dr. Lisbeth Renshaw. 3.  Counts are improved and no transfusion is indicated today.   4.  The patient may be discharged to home when otherwise medically stable.  Please keep PICC line in place upon discharge.  Dressing was changed today, 10/25/2019.  I will arrange for a PICC line flush on 10/28/2019.  She will follow up with Dr. Benay Spice on 10/31/2019.    LOS: 8 days   Mikey Bussing, DNP, AGPCNP-BC, AOCNP 10/25/19 Ms. Alberto was interviewed and examined.  The white count has recovered and the platelets are improved.  The hemoglobin is  up after a transfusion yesterday.  She is stable for discharge from an oncology standpoint when she is ambulatory and eating.  Outpatient follow-up will be scheduled at the Cancer center.  She will continue radiation.  We will consider resuming chemotherapy with dose reductions during the week of 11/07/2019.  PICC will remain in place at discharge.

## 2019-10-25 NOTE — Progress Notes (Signed)
Per Dr. Benay Spice: Needs lab/flush/OV on 10/31/19. Scheduling message sent.

## 2019-10-25 NOTE — Progress Notes (Signed)
Nutrition Follow-up  DOCUMENTATION CODES:   Obesity unspecified  INTERVENTION:   -D/c Ensure supplements  NUTRITION DIAGNOSIS:   Increased nutrient needs related to cancer and cancer related treatments as evidenced by estimated needs.  Ongoing.  GOAL:   Patient will meet greater than or equal to 90% of their needs  Progressing.  MONITOR:   PO intake, Supplement acceptance, Labs, Weight trends, I & O's  ASSESSMENT:   73 y.o. female with medical history significant of squamous cell carcinoma of anal margin COPD, GERD, uterine cancer, HLD, OSA, IBS, hypothyroidism, HTN, fibromyalgia, depression, chronic back pain Admitted for  thrombocytopenia Diagnosed with anal cancer on 9/24. Began chemotherapy on 10/12.   Patient currently consuming 25-45% of meals. Pt is not drinking Ensure supplements, will d/c. Per oncology note 11/2, pt may discharge today.   No new weights have been measured since 10/27.  Medications: IV Zofran  Labs reviewed.   Diet Order:   Diet Order            DIET SOFT Room service appropriate? Yes; Fluid consistency: Thin  Diet effective now              EDUCATION NEEDS:   No education needs have been identified at this time  Skin:  Skin Assessment: Reviewed RN Assessment  Last BM:  11/1 -type 4  Height:   Ht Readings from Last 1 Encounters:  10/18/19 5\' 8"  (1.727 m)    Weight:   Wt Readings from Last 1 Encounters:  10/18/19 105.2 kg    Ideal Body Weight:  63.6 kg  BMI:  Body mass index is 35.26 kg/m.  Estimated Nutritional Needs:   Kcal:  2100-2300  Protein:  95-105g  Fluid:  2.1L/day  Clayton Bibles, MS, RD, LDN Inpatient Clinical Dietitian Pager: [phone] After Hours Pager: [phone]

## 2019-10-26 ENCOUNTER — Ambulatory Visit
Admission: RE | Admit: 2019-10-26 | Discharge: 2019-10-26 | Disposition: A | Discharge status: Home / Self Care | Payer: Medicare Other | Source: Ambulatory Visit | Attending: Radiation Oncology | Admitting: Radiation Oncology

## 2019-10-26 DIAGNOSIS — C210 Malignant neoplasm of anus, unspecified: Secondary | ICD-10-CM | POA: Diagnosis not present

## 2019-10-26 LAB — RENAL FUNCTION PANEL
Albumin: 2.6 g/dL — ABNORMAL LOW (ref 3.5–5.0)
Anion gap: 6 (ref 5–15)
BUN: 15 mg/dL (ref 8–23)
CO2: 29 mmol/L (ref 22–32)
Calcium: 7.8 mg/dL — ABNORMAL LOW (ref 8.9–10.3)
Chloride: 105 mmol/L (ref 98–111)
Creatinine, Ser: 0.96 mg/dL (ref 0.44–1.00)
GFR calc Af Amer: >60 mL/min (ref >60)
GFR calc non Af Amer: 60 mL/min — ABNORMAL LOW (ref >60)
Glucose, Bld: 123 mg/dL — ABNORMAL HIGH (ref 70–99)
Phosphorus: 3.2 mg/dL (ref 2.5–4.6)
Potassium: 3 mmol/L — ABNORMAL LOW (ref 3.5–5.1)
Sodium: 140 mmol/L (ref 135–145)

## 2019-10-26 LAB — CBC WITH DIFFERENTIAL/PLATELET
Abs Immature Granulocytes: 0.5 10*3/uL — ABNORMAL HIGH (ref 0.00–0.07)
Band Neutrophils: 7 %
Basophils Absolute: 0 10*3/uL (ref 0.0–0.1)
Basophils Relative: 0 %
Eosinophils Absolute: 0 10*3/uL (ref 0.0–0.5)
Eosinophils Relative: 0 %
HCT: 24.2 % — ABNORMAL LOW (ref 36.0–46.0)
Hemoglobin: 8.1 g/dL — ABNORMAL LOW (ref 12.0–15.0)
Lymphocytes Relative: 9 %
Lymphs Abs: 0.4 10*3/uL — ABNORMAL LOW (ref 0.7–4.0)
MCH: 30.3 pg (ref 26.0–34.0)
MCHC: 33.5 g/dL (ref 30.0–36.0)
MCV: 90.6 fL (ref 80.0–100.0)
Metamyelocytes Relative: 4 %
Monocytes Absolute: 0.3 10*3/uL (ref 0.1–1.0)
Monocytes Relative: 8 %
Myelocytes: 8 %
Neutro Abs: 2.9 10*3/uL (ref 1.7–7.7)
Neutrophils Relative %: 64 %
Platelets: 52 10*3/uL — ABNORMAL LOW (ref 150–400)
RBC: 2.67 MIL/uL — ABNORMAL LOW (ref 3.87–5.11)
RDW: 12.8 % (ref 11.5–15.5)
WBC: 4.1 10*3/uL (ref 4.0–10.5)
nRBC: 1.2 % — ABNORMAL HIGH (ref 0.0–0.2)

## 2019-10-26 LAB — MAGNESIUM: Magnesium: 1.7 mg/dL (ref 1.7–2.4)

## 2019-10-26 LAB — CREATININE, SERUM
Creatinine, Ser: 1.03 mg/dL — ABNORMAL HIGH (ref 0.44–1.00)
GFR calc Af Amer: >60 mL/min (ref >60)
GFR calc non Af Amer: 55 mL/min — ABNORMAL LOW (ref >60)

## 2019-10-26 MED ORDER — MAGNESIUM SULFATE 2 GM/50ML IV SOLN
2.0000 g | Freq: Once | INTRAVENOUS | Status: AC
  Administered 2019-10-26: 15:00:00 2 g via INTRAVENOUS
  Filled 2019-10-26: qty 50

## 2019-10-26 MED ORDER — POTASSIUM CHLORIDE CRYS ER 20 MEQ PO TBCR
40.0000 meq | EXTENDED_RELEASE_TABLET | Freq: Four times a day (QID) | ORAL | Status: AC
  Administered 2019-10-26 (×2): 40 meq via ORAL
  Filled 2019-10-26 (×2): qty 2

## 2019-10-26 NOTE — TOC Transition Note (Addendum)
Transition of Care [facility]) - CM/SW Discharge Note   Patient Details  Name: Joanne Klein MRN: [id number] Date of Birth: 20-Nov-1946  Transition [facility] CM/SW Contact:  Nila Nephew, LCSW Phone Number: [phone] 10/26/2019, 11:30 AM   Clinical Narrative:   Ordered home o2 from [employer] for pt planning to DC today.  Will notify [facility] agency [facility] Lemmings) pt plan to DC home. Has f/u at [facility] scheduled.  Update: Planned DC tomorrow 10/27/2019   Barriers to Discharge: No Barriers Identified   Patient Goals and CMS Choice Patient states their goals for this hospitalization and ongoing recovery are:: "I had a very bad reaction to the chemo.  I don't know what the plan is going forward." CMS Medicare.gov Compare Post Acute Care list provided to:: Patient Choice offered to / list presented to : Patient  Discharge Placement                       Discharge Plan and Services   Discharge Planning Services: CM Consult Post Acute Care Choice: Home Health                    HH Arranged: PT, OT [facility] Agency: [place] Date [place]: 10/21/19 Time [place]: [id number] Representative spoke with at [place]: [place] ([place]) Interventions     Readmission Risk Interventions No flowsheet data found.

## 2019-10-26 NOTE — Progress Notes (Signed)
SATURATION QUALIFICATIONS: (This note is used to comply with regulatory documentation for home oxygen)  Patient Saturations on Room Air at Rest = 86%  Patient Saturations on Room Air while Ambulating = 80%  Patient Saturations on 4 Liters of oxygen while Ambulating = 95%  Please briefly explain why patient needs home oxygen: patient qualifies for home oxygen use at this time.

## 2019-10-26 NOTE — Progress Notes (Signed)
PROGRESS NOTE    Joanne Klein  [id number] DOB: [birth year] DOA: 10/17/2019 PCP: Hoyt Koch, MD  Subjective:  Patient seen after she had her radiation therapy, complained about dizziness. Her potassium is low at 3.0 She will need oxygen as her oxygen saturation is 86% on room air at rest. Replace potassium, give magnesium.  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  Patient was admitted with severe pancytopenia/neutropenia.  Neutropenia was managed with Granix.  CBC done today (10/25/2019) revealed WBC of 4.5 and platelet count of 45.  Cultures were nonrevealing.  Chest x-ray is clear.  Patient was on Levaquin empirically.  Patient will be discharged back on tomorrow after radiation therapy.  Assessment & Plan:   Active Problems:   Depression   Hypothyroidism   GERD (gastroesophageal reflux disease)   OCD (obsessive compulsive disorder)   COPD GOLD II    Hyperlipidemia   Anal cancer (HCC)   Thrombocytopenia (HCC)   Chemotherapy-induced neutropenia (HCC)  Severe neutropenia/pancytopenia Epistaxis/rectal bleeding, secondary to thrombocytopenia, resolved -Status post chemotherapy.  Status post 2 unit PRBC transfusion.  Monitor platelets and hemoglobin.  If necessary transfer.  Hold off on transfusion today as patient is not actively bleeding. -Prophylactic continue Levaquin.  Broaden coverage if needed -PICC line -IV Granix -Sitz bath. 10/22/2019: Continue to monitor WBC and platelet count.  Panculture patient.  Urinalysis.  Chest x-ray. 10/23/2019: UA is nonrevealing.  Chest  x-ray report has not revealed any significant findings.  Cultures are still pending.  WBC is improving.  WBC is 1 today.  Platelet count is 28.  We will continue current management. 10/24/2019: No documented fever.  Negative.  WBC is 2 today.  Platelet count is 37.  Granix discontinued.  Hemoglobin of 6.8 g/dL.  Patient will transfuse 1 unit of packed red blood cells.  Radiation is also planned. 10/25/2019: WBC today is 4.5 and platelet count is 45.   Patient will be discharged back on tomorrow after radiation therapy.  Nonspecific diarrhea -Noninfectious.  Likely from chemo.  If necessary depression can get antidiarrheals as needed.  Increase oral intake -10/24/2019: Resolved.  Mucositis -Magic mouthwash with lidocaine.  Nystatin  Left lower extremity swelling -Recent Dopplers and CTA are negative.  Hypokalemia -Continue to monitor and replete.   -Potassium checked on 10/23/2019 was 3.5.    Atypical chest pain, resolved  History of anal cancer -Currently ongoing radiation and chemotherapy.  On hold -10/24/2019: For radiation tomorrow.  Hypothyroidism -Continue Synthroid. -TSH 2.9  Depression -Continue home meds  Moderate to severe protein calorie malnutrition -Nutrition consulted.  Prealbumin 10.7.  GERD -PPI  History of chronic COPD -Currently stable.  As needed bronchodilators  Hyperlipidemia -Statin  PT recommends-home PT  DVT prophylaxis: SCDs Code Status: Limited code, okay with intubation Family Communication: Daughter Disposition Plan: This will depend on hospital course Consultants:   Oncology  Procedures:   PICC placement by IR 10/27  Antimicrobials:   Empiric Levaquin   Objective: Vitals:   10/26/19 0616 10/26/19 0617 10/26/19 0849 10/26/19 0911  BP: (!) 158/80     Pulse: 84  (!) 118 (!) 106  Resp: 18     Temp: 97.7  F (36.5 C)     TempSrc: Oral     SpO2: (!) 88% 93% (!) 84% 91%  Weight:      Height:        Intake/Output Summary  (Last 24 hours) at 10/26/2019 1414 Last data filed at 10/26/2019 0913 Gross per 24 hour  Intake 365 ml  Output -  Net 365 ml   Filed Weights   10/18/19 0136  Weight: 105.2 kg    Examination: Constitutional: NAD, calm, comfortable HEENT: Pallor.  No jaundice Neck: normal, supple, no masses, no thyromegaly Respiratory: clear to auscultation bilaterally, no wheezing, no crackles. Normal respiratory effort. No accessory muscle use.  Cardiovascular: Regular rate and rhythm, no murmurs / rubs / gallops. No extremity edema. 2+ pedal pulses. No carotid bruits.  Abdomen: Obese, soft and nontender.  Organs are difficult to assess. Neurologic: Awake and alert.  Patient moves all extremities.    Data Reviewed:   CBC: Recent Labs  Lab 10/22/19 0256 10/23/19 1100 10/24/19 0500 10/25/19 0404 10/26/19 0503  WBC 0.4* 1.0* 2.0* 4.5 4.1  NEUTROABS  --   --  1.4* 3.4 2.9  HGB 7.2* 7.2* 6.8* 8.3* 8.1*  HCT 21.6* 21.8* 20.2* 24.8* 24.2*  MCV 88.5 92.0 91.0 90.2 90.6  PLT 19* 28* 37* 45* 52*   Basic Metabolic Panel: Recent Labs  Lab 10/20/19 0331 10/21/19 0343 10/22/19 0256 10/23/19 0415 10/24/19 0500 10/25/19 0404 10/26/19 0503  NA 139 138 139 140  --   --  140  K 4.3 3.6 3.4* 3.5  --   --  3.0*  CL 109 103 105 107  --   --  105  CO2 24 25 26 26   --   --  29  GLUCOSE 97 101* 102* 115*  --   --  123*  BUN 17 15 14 14   --   --  15  CREATININE 0.75 0.83 0.79 0.81 0.47 0.89 0.96  1.03*  CALCIUM 7.9* 8.1* 8.0* 7.8*  --   --  7.8*  MG 1.7 1.5* 1.6* 1.6*  --   --  1.7  PHOS  --   --   --   --   --   --  3.2   GFR: Estimated Creatinine Clearance: 63.6 mL/min (A) (by C-G formula based on SCr of 1.03 mg/dL (H)). Liver Function Tests: Recent Labs  Lab 10/20/19 0331 10/21/19 0343 10/26/19 0503  AST 9* 9*  --   ALT 9 11  --   ALKPHOS 42 47  --   BILITOT 0.8 1.2  --   PROT 5.4* 5.5*  --   ALBUMIN 2.5* 2.6* 2.6*   No results for input(s): LIPASE, AMYLASE in the last 168 hours. No  results for input(s): AMMONIA in the last 168 hours. Coagulation Profile: No results for input(s): INR, PROTIME in the last 168 hours. Cardiac Enzymes: No results for input(s): CKTOTAL, CKMB, CKMBINDEX, TROPONINI in the last 168 hours. BNP (last 3 results) No results for input(s): PROBNP in the last 8760 hours. HbA1C: No results for input(s): HGBA1C in the last 72 hours. CBG: No results for input(s): GLUCAP in the last 168 hours. Lipid Profile: No results for input(s): CHOL, HDL, LDLCALC, TRIG, CHOLHDL, LDLDIRECT in the last 72 hours. Thyroid Function Tests: No results for input(s): TSH, T4TOTAL, FREET4, T3FREE, THYROIDAB in the last 72 hours. Anemia Panel: No results for input(s): VITAMINB12, FOLATE, FERRITIN, TIBC, IRON, RETICCTPCT in the last 72 hours. Sepsis Labs: No results for input(s): PROCALCITON,  LATICACIDVEN in the last 168 hours.  Recent Results (from the past 240 hour(s))  SARS CORONAVIRUS 2 (TAT 6-24 HRS) Nasopharyngeal Nasopharyngeal Swab     Status: None   Collection Time: 10/17/19  4:16 PM   Specimen: Nasopharyngeal Swab  Result Value Ref Range Status   SARS Coronavirus 2 NEGATIVE NEGATIVE Final    Comment: (NOTE) SARS-CoV-2 target nucleic acids are NOT DETECTED. The SARS-CoV-2 RNA is generally detectable in upper and lower respiratory specimens during the acute phase of infection. Negative results do not preclude SARS-CoV-2 infection, do not rule out co-infections with other pathogens, and should not be used as the sole basis for treatment or other patient management decisions. Negative results must be combined with clinical observations, patient history, and epidemiological information. The expected result is Negative. Fact Sheet for Patients: SugarRoll.be Fact Sheet for Healthcare Providers: https://www.woods-mathews.com/ This test is not yet approved or cleared by the [place] FDA and  has been authorized for  detection and/or diagnosis of SARS-CoV-2 by FDA under an Emergency Use Authorization (EUA). This EUA will remain  in effect (meaning this test can be used) for the duration of the COVID-19 declaration under Section 56 4(b)(1) of the Act, 21 U.S.C. section 360bbb-3(b)(1), unless the authorization is terminated or revoked sooner. Performed at [facility], [place] [address] [place], [place] [address]   Culture, Urine     Status: None   Collection Time: 10/22/19  5:07 PM   Specimen: Urine, Clean Catch  Result Value Ref Range Status   Specimen Description   Final    URINE, CLEAN CATCH Performed at [facility], [place] [address], [place], [place] [address]    Special Requests   Final    Immunocompromised Performed at [facility], [place] [address], [place], [place] [address]    Culture   Final    NO GROWTH Performed at [facility], [place] [address], [place], [place] [address]    Report Status 10/24/2019 FINAL  Final  Culture, blood (routine x 2)     Status: None (Preliminary result)   Collection Time: 10/22/19  6:36 PM   Specimen: BLOOD  Result Value Ref Range Status   Specimen Description   Final    BLOOD LEFT ANTECUBITAL Performed at [facility], [place] [address] [place], [place] [address]    Special Requests   Final    BOTTLES DRAWN AEROBIC ONLY Blood Culture adequate volume Performed at [place] [address] [place], [place] [address]    Culture   Final    NO GROWTH 3 DAYS Performed at [facility], [place] [address], [place], [place] [address]    Report Status PENDING  Incomplete  Culture, blood (routine x 2)     Status: None (Preliminary result)   Collection Time: 10/22/19  6:36 PM   Specimen: BLOOD LEFT FOREARM  Result Value Ref Range Status   Specimen Description   Final    BLOOD LEFT FOREARM Performed at [place] [address], [place], [place] [address]     Special Requests   Final    BOTTLES DRAWN AEROBIC ONLY Blood Culture adequate volume Performed at [place] [address] [place], [place] [address]    Culture   Final    NO GROWTH 3 DAYS Performed at [facility], [place] [address], [place],  [address]    Report Status PENDING  Incomplete  Radiology Studies: No results found.      Scheduled Meds: . atorvastatin  20 mg Oral QODAY  . barrier cream  1 application Topical BID  . Chlorhexidine Gluconate Cloth  6 each Topical Daily  . hydrocortisone  1 application Rectal BID  . levothyroxine  75 mcg Oral Q0600  . pantoprazole  40 mg Oral Daily  . potassium chloride  40 mEq Oral Q6H  . sertraline  200 mg Oral QHS  . sodium chloride flush  10-40 mL Intracatheter Q12H   Continuous Infusions: . magnesium sulfate bolus IVPB       LOS: 9 days   Time spent= 25 mins  Birdie Hopes, MD [facility]  If 7PM-7AM, please contact night-coverage  10/26/2019, 2:14 PM

## 2019-10-27 ENCOUNTER — Ambulatory Visit
Admission: RE | Admit: 2019-10-27 | Discharge: 2019-10-27 | Disposition: A | Discharge status: Home / Self Care | Payer: Medicare Other | Source: Ambulatory Visit | Attending: Radiation Oncology | Admitting: Radiation Oncology

## 2019-10-27 DIAGNOSIS — C210 Malignant neoplasm of anus, unspecified: Secondary | ICD-10-CM | POA: Diagnosis not present

## 2019-10-27 LAB — CBC WITH DIFFERENTIAL/PLATELET
Abs Immature Granulocytes: 0.3 10*3/uL — ABNORMAL HIGH (ref 0.00–0.07)
Band Neutrophils: 6 %
Basophils Absolute: 0 10*3/uL (ref 0.0–0.1)
Basophils Relative: 0 %
Eosinophils Absolute: 0 10*3/uL (ref 0.0–0.5)
Eosinophils Relative: 0 %
HCT: 24.4 % — ABNORMAL LOW (ref 36.0–46.0)
Hemoglobin: 8 g/dL — ABNORMAL LOW (ref 12.0–15.0)
Lymphocytes Relative: 8 %
Lymphs Abs: 0.4 10*3/uL — ABNORMAL LOW (ref 0.7–4.0)
MCH: 30 pg (ref 26.0–34.0)
MCHC: 32.8 g/dL (ref 30.0–36.0)
MCV: 91.4 fL (ref 80.0–100.0)
Metamyelocytes Relative: 1 %
Monocytes Absolute: 0.7 10*3/uL (ref 0.1–1.0)
Monocytes Relative: 16 %
Myelocytes: 5 %
Neutro Abs: 3.1 10*3/uL (ref 1.7–7.7)
Neutrophils Relative %: 64 %
Platelets: 65 10*3/uL — ABNORMAL LOW (ref 150–400)
RBC: 2.67 MIL/uL — ABNORMAL LOW (ref 3.87–5.11)
RDW: 12.8 % (ref 11.5–15.5)
WBC: 4.4 10*3/uL (ref 4.0–10.5)
nRBC: 2.1 % — ABNORMAL HIGH (ref 0.0–0.2)

## 2019-10-27 LAB — BASIC METABOLIC PANEL
Anion gap: 4 — ABNORMAL LOW (ref 5–15)
BUN: 17 mg/dL (ref 8–23)
CO2: 31 mmol/L (ref 22–32)
Calcium: 7.8 mg/dL — ABNORMAL LOW (ref 8.9–10.3)
Chloride: 104 mmol/L (ref 98–111)
Creatinine, Ser: 0.83 mg/dL (ref 0.44–1.00)
GFR calc Af Amer: >60 mL/min (ref >60)
GFR calc non Af Amer: >60 mL/min (ref >60)
Glucose, Bld: 114 mg/dL — ABNORMAL HIGH (ref 70–99)
Potassium: 4.5 mmol/L (ref 3.5–5.1)
Sodium: 139 mmol/L (ref 135–145)

## 2019-10-27 LAB — CREATININE, SERUM
Creatinine, Ser: 1.02 mg/dL — ABNORMAL HIGH (ref 0.44–1.00)
GFR calc Af Amer: >60 mL/min (ref >60)
GFR calc non Af Amer: 55 mL/min — ABNORMAL LOW (ref >60)

## 2019-10-27 LAB — MAGNESIUM: Magnesium: 2 mg/dL (ref 1.7–2.4)

## 2019-10-27 MED ORDER — HEPARIN SOD (PORK) LOCK FLUSH 100 UNIT/ML IV SOLN
250.0000 [IU] | Freq: Every day | INTRAVENOUS | Status: DC
  Administered 2019-10-27: 14:00:00 250 [IU]
  Filled 2019-10-27 (×2): qty 2.5

## 2019-10-27 MED ORDER — HEPARIN SOD (PORK) LOCK FLUSH 100 UNIT/ML IV SOLN
250.0000 [IU] | INTRAVENOUS | Status: DC | PRN
  Administered 2019-10-27: 250 [IU]

## 2019-10-27 NOTE — Care Management Important Message (Signed)
Important Message  Patient Details IM Letter given to Sharren Bridge SW to present to the Patient Name: Joanne Klein MRN: [id number] Date of Birth: Dec 07, 1946   Medicare Important Message Given:  Yes     [name] 10/27/2019, 12:59 PM

## 2019-10-27 NOTE — Progress Notes (Signed)
Pt discharged home with niece in stable condition. Discharge instructions given. Pt verbalized understanding. No immediate questions or concerns at this time. Discharged from the unit via wheelchair.

## 2019-10-27 NOTE — Discharge Summary (Signed)
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  Patient was admitted with severe pancytopenia/neutropenia.  Neutropenia was managed with Granix.  CBC done today (10/25/2019) revealed WBC of 4.5 and platelet count of 45.  Cultures were nonrevealing.  Chest x-ray is clear.  Patient was on Levaquin empirically.  Patient will be discharged back on tomorrow after radiation therapy.  Discharge Diagnoses: Present on Admission: . Thrombocytopenia ([place]) . Depression . Hypothyroidism . GERD (gastroesophageal reflux disease) . OCD (obsessive compulsive disorder) . COPD GOLD II  . Hyperlipidemia . Anal cancer ([place])   Severe neutropenia/pancytopenia Epistaxis/rectal bleeding, secondary to thrombocytopenia, resolved -Status post chemotherapy.  Status post 2 unit PRBC transfusion.  Monitor platelets and hemoglobin.  If necessary transfer.  Hold off on transfusion today as patient is not actively bleeding. -Prophylactic continue Levaquin.  Broaden coverage if needed -PICC line -IV Granix -Sitz bath. 10/22/2019: Continue to monitor WBC and platelet count.  Panculture patient.  Urinalysis.  Chest x-ray. 10/23/2019: UA is nonrevealing.  Chest x-ray report has not revealed any significant findings.  Cultures are still pending.  WBC is improving.  WBC is 1 today.  Platelet  count is 28.  We will continue current management. 10/24/2019: No documented fever.  Negative.  WBC is 2 today.  Platelet count is 37.   On discharge WBC is 4.4, hemoglobin 8.0 and platelets 65.  Nonspecific diarrhea Noninfectious.  Likely from chemo.  If necessary depression can get antidiarrheals as needed.  Increase oral intake 10/24/2019: Resolved.  Mucositis Magic mouthwash with lidocaine.  Nystatin  Left lower extremity swelling Recent Dopplers and CTA are negative.  Hypokalemia Continue to monitor and replete.   Potassium checked on 10/23/2019 was 3.5.    Atypical chest pain, resolved  History of anal cancer Currently ongoing radiation and chemotherapy.  On hold 10/24/2019: For radiation tomorrow.  Hypothyroidism Continue Synthroid. TSH 2.9  Depression Continue home meds  Moderate to severe protein calorie malnutrition Nutrition consulted.  Prealbumin 10.7.  GERD PPI  History of chronic COPD Currently stable.  As needed bronchodilators  Hyperlipidemia Statin.  Hypokalemia On 11/4 potassium was 3.0, given total of 80 mEq of potassium. Potassium is 4.5 this morning, this is resolved   Allergies as of 10/27/2019      Reactions   Prozac [fluoxetine Hcl] Anaphylaxis   Codeine Itching   Morphine And Related Itching   Sulfa Antibiotics Itching, Nausea Only      Medication List    STOP taking these medications   ciprofloxacin 500 MG tablet Commonly known as: Cipro     TAKE these medications   atorvastatin 20 MG tablet Commonly known as: LIPITOR Take 1 tablet (20 mg total) by mouth every other day. What changed: additional instructions   levothyroxine 75 MCG tablet Commonly known as: SYNTHROID TAKE 1 TABLET BY MOUTH  DAILY BEFORE BREAKFAST What changed: See the new instructions.   omeprazole 40  MG capsule Commonly known as: PRILOSEC TAKE 1 CAPSULE BY MOUTH  DAILY   oxyCODONE-acetaminophen 10-325 MG tablet Commonly known as:  PERCOCET Take 1 tablet by mouth every 4 (four) hours as needed for pain. Gets from another provider @@ 180 per month   sertraline 100 MG tablet Commonly known as: ZOLOFT Take 2 tablets (200 mg total) by mouth at bedtime.   Vitamin D-3 25 MCG (1000 UT) Caps Take 1 capsule by mouth daily.            Durable Medical Equipment  (From admission, onward)         Start     Ordered   10/26/19 1133  For home use only DME oxygen  Once    Question Answer Comment  Length of Need 6 Months   Mode or (Route) Nasal cannula   Liters per Minute 2   Frequency Continuous (stationary and portable oxygen unit needed)   Oxygen conserving device Yes   Oxygen delivery system Gas      10/26/19 1132            Day of Discharge BP (!) 167/85 (BP Location: Left Arm)   Pulse 85   Temp 98.3 F (36.8 C) (Oral)   Resp 16   Ht 5\' 8"  (1.727 m)   Wt 105.2 kg   SpO2 95%   BMI 35.26 kg/m  Physical Exam:  General: Alert, disoriented not in any acute distress.  HEENT: anicteric sclera, pupils equal reactive to light and accommodation  CVS: S1-S2 heard, no murmur rubs or gallops  Chest: clear to auscultation bilaterally, no wheezing rales or rhonchi  Abdomen: normal bowel sounds, soft, nontender, nondistended, no organomegaly  Neuro: Cranial nerves II-XII intact, no focal neurological deficits  Extremities: no cyanosis, no clubbing or edema noted bilaterally  Results for orders placed or performed during the hospital encounter of 10/17/19 (from the past 24 hour(s))  Creatinine, serum     Status: Abnormal   Collection Time: 10/27/19  3:43 AM  Result Value Ref Range   Creatinine, Ser 1.02 (H) 0.44 - 1.00 mg/dL   GFR calc non Af Amer 55 (L) >60 mL/min   GFR calc Af Amer >60 >60 mL/min  CBC with Differential/Platelet     Status: Abnormal   Collection Time: 10/27/19  3:43 AM  Result Value Ref Range   WBC 4.4 4.0 - 10.5 K/uL   RBC 2.67 (L) 3.87 - 5.11 MIL/uL   Hemoglobin 8.0 (L) 12.0 - 15.0  g/dL   HCT 24.4 (L) 36.0 - 46.0 %   MCV 91.4 80.0 - 100.0 fL   MCH 30.0 26.0 - 34.0 pg   MCHC 32.8 30.0 - 36.0 g/dL   RDW 12.8 11.5 - 15.5 %   Platelets 65 (L) 150 - 400 K/uL   nRBC 2.1 (H) 0.0 - 0.2 %   Neutrophils Relative % 64 %   Neutro Abs 3.1 1.7 - 7.7 K/uL   Band Neutrophils 6 %   Lymphocytes Relative 8 %   Lymphs Abs 0.4 (L) 0.7 - 4.0 K/uL   Monocytes Relative 16 %   Monocytes Absolute 0.7 0.1 - 1.0 K/uL   Eosinophils Relative 0 %   Eosinophils Absolute 0.0 0.0 - 0.5 K/uL   Basophils Relative 0 %   Basophils Absolute 0.0 0.0 - 0.1 K/uL   WBC Morphology DOHLE BODIES    Metamyelocytes Relative 1 %   Myelocytes 5 %   Abs Immature Granulocytes 0.30 (H) 0.00 - 0.07  K/uL  Magnesium     Status: None   Collection Time: 10/27/19  3:43 AM  Result Value Ref Range   Magnesium 2.0 1.7 - 2.4 mg/dL  BMP in AM     Status: Abnormal   Collection Time: 10/27/19 10:32 AM  Result Value Ref Range   Sodium 139 135 - 145 mmol/L   Potassium 4.5 3.5 - 5.1 mmol/L   Chloride 104 98 - 111 mmol/L   CO2 31 22 - 32 mmol/L   Glucose, Bld 114 (H) 70 - 99 mg/dL   BUN 17 8 - 23 mg/dL   Creatinine, Ser 0.83 0.44 - 1.00 mg/dL   Calcium 7.8 (L) 8.9 - 10.3 mg/dL   GFR calc non Af Amer >60 >60 mL/min   GFR calc Af Amer >60 >60 mL/min   Anion gap 4 (L) 5 - 15    Disposition: Home   Follow-up Appts: Discharge Instructions    Diet - low sodium heart healthy   Complete by: As directed    Increase activity slowly   Complete by: As directed         I spent 40 minutes completing paperwork and coordinating discharge efforts.  Signed:  Le Fer[place] A Krysta Bloomfield 10/27/2019, 11:18 AM

## 2019-10-28 ENCOUNTER — Ambulatory Visit
Admission: RE | Admit: 2019-10-28 | Discharge: 2019-10-28 | Disposition: A | Discharge status: Home / Self Care | Payer: Medicare Other | Source: Ambulatory Visit | Attending: Radiation Oncology | Admitting: Radiation Oncology

## 2019-10-28 ENCOUNTER — Other Ambulatory Visit: Payer: Self-pay | Admitting: Oncology

## 2019-10-28 ENCOUNTER — Other Ambulatory Visit: Payer: Self-pay

## 2019-10-28 DIAGNOSIS — C210 Malignant neoplasm of anus, unspecified: Secondary | ICD-10-CM | POA: Diagnosis not present

## 2019-10-28 DIAGNOSIS — Z510 Encounter for antineoplastic radiation therapy: Secondary | ICD-10-CM | POA: Diagnosis not present

## 2019-10-28 LAB — CULTURE, BLOOD (ROUTINE X 2)
Culture: NO GROWTH
Culture: NO GROWTH
Special Requests: ADEQUATE
Special Requests: ADEQUATE

## 2019-10-29 ENCOUNTER — Ambulatory Visit: Payer: Medicare Other

## 2019-10-31 ENCOUNTER — Encounter: Payer: Self-pay | Admitting: Nurse Practitioner

## 2019-10-31 ENCOUNTER — Ambulatory Visit
Admission: RE | Admit: 2019-10-31 | Discharge: 2019-10-31 | Disposition: A | Discharge status: Home / Self Care | Payer: Medicare Other | Source: Ambulatory Visit | Attending: Radiation Oncology | Admitting: Radiation Oncology

## 2019-10-31 ENCOUNTER — Inpatient Hospital Stay: Payer: Medicare Other

## 2019-10-31 ENCOUNTER — Telehealth: Payer: Self-pay | Admitting: *Deleted

## 2019-10-31 ENCOUNTER — Inpatient Hospital Stay: Payer: Medicare Other | Attending: Oncology | Admitting: Nurse Practitioner

## 2019-10-31 ENCOUNTER — Other Ambulatory Visit: Payer: Self-pay

## 2019-10-31 VITALS — BP 125/83 | Temp 97.8°F | Resp 17 | Ht 68.0 in | Wt 229.2 lb

## 2019-10-31 DIAGNOSIS — Z510 Encounter for antineoplastic radiation therapy: Secondary | ICD-10-CM | POA: Diagnosis not present

## 2019-10-31 DIAGNOSIS — D701 Agranulocytosis secondary to cancer chemotherapy: Secondary | ICD-10-CM | POA: Insufficient documentation

## 2019-10-31 DIAGNOSIS — E039 Hypothyroidism, unspecified: Secondary | ICD-10-CM | POA: Diagnosis not present

## 2019-10-31 DIAGNOSIS — M797 Fibromyalgia: Secondary | ICD-10-CM | POA: Insufficient documentation

## 2019-10-31 DIAGNOSIS — Z8542 Personal history of malignant neoplasm of other parts of uterus: Secondary | ICD-10-CM | POA: Diagnosis not present

## 2019-10-31 DIAGNOSIS — Z79899 Other long term (current) drug therapy: Secondary | ICD-10-CM | POA: Diagnosis not present

## 2019-10-31 DIAGNOSIS — Z5111 Encounter for antineoplastic chemotherapy: Secondary | ICD-10-CM | POA: Insufficient documentation

## 2019-10-31 DIAGNOSIS — I951 Orthostatic hypotension: Secondary | ICD-10-CM | POA: Insufficient documentation

## 2019-10-31 DIAGNOSIS — F329 Major depressive disorder, single episode, unspecified: Secondary | ICD-10-CM | POA: Insufficient documentation

## 2019-10-31 DIAGNOSIS — C210 Malignant neoplasm of anus, unspecified: Secondary | ICD-10-CM

## 2019-10-31 DIAGNOSIS — Z452 Encounter for adjustment and management of vascular access device: Secondary | ICD-10-CM

## 2019-10-31 DIAGNOSIS — J449 Chronic obstructive pulmonary disease, unspecified: Secondary | ICD-10-CM | POA: Diagnosis not present

## 2019-10-31 DIAGNOSIS — Z95828 Presence of other vascular implants and grafts: Secondary | ICD-10-CM

## 2019-10-31 DIAGNOSIS — T451X5A Adverse effect of antineoplastic and immunosuppressive drugs, initial encounter: Secondary | ICD-10-CM | POA: Insufficient documentation

## 2019-10-31 DIAGNOSIS — D6959 Other secondary thrombocytopenia: Secondary | ICD-10-CM | POA: Insufficient documentation

## 2019-10-31 DIAGNOSIS — M549 Dorsalgia, unspecified: Secondary | ICD-10-CM | POA: Diagnosis not present

## 2019-10-31 DIAGNOSIS — G8929 Other chronic pain: Secondary | ICD-10-CM | POA: Insufficient documentation

## 2019-10-31 LAB — CMP (CANCER CENTER ONLY)
ALT: 9 U/L (ref 0–44)
AST: 13 U/L — ABNORMAL LOW (ref 15–41)
Albumin: 3.2 g/dL — ABNORMAL LOW (ref 3.5–5.0)
Alkaline Phosphatase: 72 U/L (ref 38–126)
Anion gap: 7 (ref 5–15)
BUN: 21 mg/dL (ref 8–23)
CO2: 32 mmol/L (ref 22–32)
Calcium: 8.6 mg/dL — ABNORMAL LOW (ref 8.9–10.3)
Chloride: 101 mmol/L (ref 98–111)
Creatinine: 0.97 mg/dL (ref 0.44–1.00)
GFR, Est AFR Am: >60 mL/min (ref >60)
GFR, Estimated: 59 mL/min — ABNORMAL LOW (ref >60)
Glucose, Bld: 104 mg/dL — ABNORMAL HIGH (ref 70–99)
Potassium: 3.9 mmol/L (ref 3.5–5.1)
Sodium: 140 mmol/L (ref 135–145)
Total Bilirubin: 0.9 mg/dL (ref 0.3–1.2)
Total Protein: 6.7 g/dL (ref 6.5–8.1)

## 2019-10-31 LAB — CBC WITH DIFFERENTIAL (CANCER CENTER ONLY)
Abs Immature Granulocytes: 0.36 10*3/uL — ABNORMAL HIGH (ref 0.00–0.07)
Basophils Absolute: 0 10*3/uL (ref 0.0–0.1)
Basophils Relative: 1 %
Eosinophils Absolute: 0 10*3/uL (ref 0.0–0.5)
Eosinophils Relative: 0 %
HCT: 30.5 % — ABNORMAL LOW (ref 36.0–46.0)
Hemoglobin: 10.2 g/dL — ABNORMAL LOW (ref 12.0–15.0)
Immature Granulocytes: 8 %
Lymphocytes Relative: 12 %
Lymphs Abs: 0.5 10*3/uL — ABNORMAL LOW (ref 0.7–4.0)
MCH: 30.3 pg (ref 26.0–34.0)
MCHC: 33.4 g/dL (ref 30.0–36.0)
MCV: 90.5 fL (ref 80.0–100.0)
Monocytes Absolute: 0.7 10*3/uL (ref 0.1–1.0)
Monocytes Relative: 15 %
Neutro Abs: 2.8 10*3/uL (ref 1.7–7.7)
Neutrophils Relative %: 64 %
Platelet Count: 111 10*3/uL — ABNORMAL LOW (ref 150–400)
RBC: 3.37 MIL/uL — ABNORMAL LOW (ref 3.87–5.11)
RDW: 12.9 % (ref 11.5–15.5)
WBC Count: 4.3 10*3/uL (ref 4.0–10.5)
nRBC: 0.7 % — ABNORMAL HIGH (ref 0.0–0.2)

## 2019-10-31 MED ORDER — SODIUM CHLORIDE 0.9% FLUSH
10.0000 mL | Freq: Once | INTRAVENOUS | Status: AC
  Administered 2019-10-31: 10 mL via INTRAVENOUS
  Filled 2019-10-31: qty 10

## 2019-10-31 MED ORDER — HEPARIN SOD (PORK) LOCK FLUSH 100 UNIT/ML IV SOLN
500.0000 [IU] | Freq: Once | INTRAVENOUS | Status: AC
  Administered 2019-10-31: 500 [IU] via INTRAVENOUS
  Filled 2019-10-31: qty 5

## 2019-10-31 NOTE — Progress Notes (Addendum)
  [facility] OFFICE PROGRESS NOTE   Diagnosis: Anal cancer  INTERVAL HISTORY:   Joanne Klein returns as scheduled.  She feels better.  She has "mild tenderness" in her throat.  She is swallowing with no significant problems.  She does note an alteration in taste.  No nausea or vomiting.  Bowels are moving.  No diarrhea.  Stools are "small".  No bleeding.  No fever.  Objective:  Vital signs in last 24 hours:  Blood pressure 125/83, temperature 97.8 F (36.6 C), temperature source Temporal, resp. rate 17, height 5\' 8"  (1.727 m), weight 229 lb 3.2 oz (104 kg), SpO2 99 %.    HEENT: No thrush or ulcers. GI: Abdomen soft and nontender.  No hepatomegaly.  External hemorrhoids.  No significant skin breakdown.  Persistent tumor at the anal verge. Vascular: No leg edema. Neuro: Alert and oriented. Skin: Ecchymoses scattered over all extremities.   Lab Results:  Lab Results  Component Value Date   WBC 4.3 10/31/2019   HGB 10.2 (L) 10/31/2019   HCT 30.5 (L) 10/31/2019   MCV 90.5 10/31/2019   PLT 111 (L) 10/31/2019   NEUTROABS PENDING 10/31/2019    Imaging:  No results found.  Medications: I have reviewed the patient's current medications.  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 Biopsy 08/30/2019 confirmed well-differentiated squamous cell carcinoma with positive P 16 and p63 stains ? CTs 09/14/2019-abnormal soft tissue fullness at the lower anus/perianal soft tissues, asymmetric left inguinal lymphadenopathy, borderline enlarged left external iliac node, mild stranding and cutaneous thickening of the left gluteal fold, 2 to 3 mm pulmonary nodules, 1.6 cm right hepatic lesion-likely a hemangioma ? Began concurrent chemoRT with 5FU/mitomycin on 10/03/2019 2. COPD 3. Fibromyalgia 4. Chronic back pain 5. Hypothyroid 6. Depression 7. History of uterine cancer-age 22 8. Pain secondary to #1 9. Orthostatic hypotension, fatigue, dyspnea on  exertion, early mucositis requiring supportive care on day 9, secondary to chemotherapy  10. Thrombocytopenia PLT 58K and neutropenia ANC 1.4 on day 9, secondary to chemotherapy 11. Worsening cytopenias, PLT 8K and ANC 0.0 on day 15, secondary to chemotherapy. Started prophylaxis with cipro on 10/22 day 11 12.  Hospital admission 10/17/2019 -fatigue, pancytopenia, hypokalemia  Disposition: Ms. Linnear appears stable.  She continues radiation.  We reviewed the CBC from today.  Blood counts are better.  She will return for lab, follow-up, 5-FU pump (dose reduced) 11/07/2019.  She will contact the office in the interim with any problems.  Patient seen with Dr. Benay Spice.    Ned Card ANP/GNP-BC   10/31/2019  3:26 PM  This was a shared visit with Ned Card.  Ms. Majmudar has improved performance status and the pancytopenia has improved.  We will consider resuming systemic therapy with dose reduced 5-FU and no mitomycin-C 11/07/2019.  Julieanne Manson, MD

## 2019-10-31 NOTE — Telephone Encounter (Signed)
Per [facility], called with critical value, pt Hgb 10.2. Provider Ned Card, NP, made aware.

## 2019-11-01 ENCOUNTER — Other Ambulatory Visit: Payer: Self-pay

## 2019-11-01 ENCOUNTER — Ambulatory Visit
Admission: RE | Admit: 2019-11-01 | Discharge: 2019-11-01 | Disposition: A | Discharge status: Home / Self Care | Payer: Medicare Other | Source: Ambulatory Visit | Attending: Radiation Oncology | Admitting: Radiation Oncology

## 2019-11-01 DIAGNOSIS — Z510 Encounter for antineoplastic radiation therapy: Secondary | ICD-10-CM | POA: Diagnosis not present

## 2019-11-01 DIAGNOSIS — C210 Malignant neoplasm of anus, unspecified: Secondary | ICD-10-CM | POA: Diagnosis not present

## 2019-11-02 ENCOUNTER — Other Ambulatory Visit: Payer: Self-pay

## 2019-11-02 ENCOUNTER — Inpatient Hospital Stay: Payer: Medicare Other

## 2019-11-02 ENCOUNTER — Ambulatory Visit
Admission: RE | Admit: 2019-11-02 | Discharge: 2019-11-02 | Disposition: A | Discharge status: Home / Self Care | Payer: Medicare Other | Source: Ambulatory Visit | Attending: Radiation Oncology | Admitting: Radiation Oncology

## 2019-11-02 DIAGNOSIS — M797 Fibromyalgia: Secondary | ICD-10-CM | POA: Diagnosis not present

## 2019-11-02 DIAGNOSIS — D6959 Other secondary thrombocytopenia: Secondary | ICD-10-CM | POA: Diagnosis not present

## 2019-11-02 DIAGNOSIS — G8929 Other chronic pain: Secondary | ICD-10-CM | POA: Diagnosis not present

## 2019-11-02 DIAGNOSIS — T451X5A Adverse effect of antineoplastic and immunosuppressive drugs, initial encounter: Secondary | ICD-10-CM | POA: Diagnosis not present

## 2019-11-02 DIAGNOSIS — E039 Hypothyroidism, unspecified: Secondary | ICD-10-CM | POA: Diagnosis not present

## 2019-11-02 DIAGNOSIS — Z95828 Presence of other vascular implants and grafts: Secondary | ICD-10-CM

## 2019-11-02 DIAGNOSIS — C210 Malignant neoplasm of anus, unspecified: Secondary | ICD-10-CM | POA: Diagnosis not present

## 2019-11-02 DIAGNOSIS — Z510 Encounter for antineoplastic radiation therapy: Secondary | ICD-10-CM | POA: Diagnosis not present

## 2019-11-02 DIAGNOSIS — Z5111 Encounter for antineoplastic chemotherapy: Secondary | ICD-10-CM | POA: Diagnosis not present

## 2019-11-02 DIAGNOSIS — D701 Agranulocytosis secondary to cancer chemotherapy: Secondary | ICD-10-CM | POA: Diagnosis not present

## 2019-11-02 DIAGNOSIS — J449 Chronic obstructive pulmonary disease, unspecified: Secondary | ICD-10-CM | POA: Diagnosis not present

## 2019-11-02 DIAGNOSIS — I951 Orthostatic hypotension: Secondary | ICD-10-CM | POA: Diagnosis not present

## 2019-11-02 DIAGNOSIS — Z79899 Other long term (current) drug therapy: Secondary | ICD-10-CM | POA: Diagnosis not present

## 2019-11-02 DIAGNOSIS — M549 Dorsalgia, unspecified: Secondary | ICD-10-CM | POA: Diagnosis not present

## 2019-11-02 LAB — DPD 5-FLUOROURACIL TOXICITY

## 2019-11-02 MED ORDER — HEPARIN SOD (PORK) LOCK FLUSH 100 UNIT/ML IV SOLN
500.0000 [IU] | Freq: Once | INTRAVENOUS | Status: AC
  Administered 2019-11-02: 250 [IU] via INTRAVENOUS
  Filled 2019-11-02: qty 5

## 2019-11-02 MED ORDER — SODIUM CHLORIDE 0.9% FLUSH
10.0000 mL | INTRAVENOUS | Status: DC | PRN
  Administered 2019-11-02: 10 mL via INTRAVENOUS
  Filled 2019-11-02: qty 10

## 2019-11-03 ENCOUNTER — Inpatient Hospital Stay: Payer: Medicare Other

## 2019-11-03 ENCOUNTER — Ambulatory Visit
Admission: RE | Admit: 2019-11-03 | Discharge: 2019-11-03 | Disposition: A | Discharge status: Home / Self Care | Payer: Medicare Other | Source: Ambulatory Visit | Attending: Radiation Oncology | Admitting: Radiation Oncology

## 2019-11-03 ENCOUNTER — Other Ambulatory Visit: Payer: Self-pay

## 2019-11-03 DIAGNOSIS — C210 Malignant neoplasm of anus, unspecified: Secondary | ICD-10-CM | POA: Diagnosis not present

## 2019-11-03 DIAGNOSIS — Z510 Encounter for antineoplastic radiation therapy: Secondary | ICD-10-CM | POA: Diagnosis not present

## 2019-11-04 ENCOUNTER — Ambulatory Visit
Admission: RE | Admit: 2019-11-04 | Discharge: 2019-11-04 | Disposition: A | Discharge status: Home / Self Care | Payer: Medicare Other | Source: Ambulatory Visit | Attending: Radiation Oncology | Admitting: Radiation Oncology

## 2019-11-04 ENCOUNTER — Other Ambulatory Visit: Payer: Self-pay

## 2019-11-04 DIAGNOSIS — Z510 Encounter for antineoplastic radiation therapy: Secondary | ICD-10-CM | POA: Diagnosis not present

## 2019-11-04 DIAGNOSIS — C210 Malignant neoplasm of anus, unspecified: Secondary | ICD-10-CM | POA: Diagnosis not present

## 2019-11-06 ENCOUNTER — Other Ambulatory Visit: Payer: Self-pay | Admitting: Oncology

## 2019-11-07 ENCOUNTER — Encounter: Payer: Self-pay | Admitting: Nurse Practitioner

## 2019-11-07 ENCOUNTER — Inpatient Hospital Stay: Payer: Medicare Other

## 2019-11-07 ENCOUNTER — Telehealth: Payer: Self-pay | Admitting: *Deleted

## 2019-11-07 ENCOUNTER — Ambulatory Visit
Admission: RE | Admit: 2019-11-07 | Discharge: 2019-11-07 | Disposition: A | Discharge status: Home / Self Care | Payer: Medicare Other | Source: Ambulatory Visit | Attending: Radiation Oncology | Admitting: Radiation Oncology

## 2019-11-07 ENCOUNTER — Inpatient Hospital Stay (HOSPITAL_BASED_OUTPATIENT_CLINIC_OR_DEPARTMENT_OTHER): Payer: Medicare Other | Admitting: Nurse Practitioner

## 2019-11-07 ENCOUNTER — Other Ambulatory Visit: Payer: Self-pay

## 2019-11-07 ENCOUNTER — Telehealth: Payer: Self-pay | Admitting: Oncology

## 2019-11-07 VITALS — BP 117/97 | HR 91 | Temp 98.5°F | Resp 17 | Wt 225.0 lb

## 2019-11-07 DIAGNOSIS — I951 Orthostatic hypotension: Secondary | ICD-10-CM | POA: Diagnosis not present

## 2019-11-07 DIAGNOSIS — Z95828 Presence of other vascular implants and grafts: Secondary | ICD-10-CM

## 2019-11-07 DIAGNOSIS — C210 Malignant neoplasm of anus, unspecified: Secondary | ICD-10-CM

## 2019-11-07 DIAGNOSIS — Z510 Encounter for antineoplastic radiation therapy: Secondary | ICD-10-CM | POA: Diagnosis not present

## 2019-11-07 DIAGNOSIS — Z5111 Encounter for antineoplastic chemotherapy: Secondary | ICD-10-CM | POA: Diagnosis not present

## 2019-11-07 DIAGNOSIS — Z452 Encounter for adjustment and management of vascular access device: Secondary | ICD-10-CM

## 2019-11-07 DIAGNOSIS — D6959 Other secondary thrombocytopenia: Secondary | ICD-10-CM | POA: Diagnosis not present

## 2019-11-07 DIAGNOSIS — T451X5A Adverse effect of antineoplastic and immunosuppressive drugs, initial encounter: Secondary | ICD-10-CM | POA: Diagnosis not present

## 2019-11-07 DIAGNOSIS — J449 Chronic obstructive pulmonary disease, unspecified: Secondary | ICD-10-CM | POA: Diagnosis not present

## 2019-11-07 DIAGNOSIS — M549 Dorsalgia, unspecified: Secondary | ICD-10-CM | POA: Diagnosis not present

## 2019-11-07 DIAGNOSIS — D696 Thrombocytopenia, unspecified: Secondary | ICD-10-CM

## 2019-11-07 DIAGNOSIS — E039 Hypothyroidism, unspecified: Secondary | ICD-10-CM | POA: Diagnosis not present

## 2019-11-07 DIAGNOSIS — G8929 Other chronic pain: Secondary | ICD-10-CM | POA: Diagnosis not present

## 2019-11-07 DIAGNOSIS — D701 Agranulocytosis secondary to cancer chemotherapy: Secondary | ICD-10-CM | POA: Diagnosis not present

## 2019-11-07 DIAGNOSIS — Z79899 Other long term (current) drug therapy: Secondary | ICD-10-CM | POA: Diagnosis not present

## 2019-11-07 DIAGNOSIS — M797 Fibromyalgia: Secondary | ICD-10-CM | POA: Diagnosis not present

## 2019-11-07 LAB — CBC WITH DIFFERENTIAL (CANCER CENTER ONLY)
Abs Immature Granulocytes: 0.04 10*3/uL (ref 0.00–0.07)
Basophils Absolute: 0.1 10*3/uL (ref 0.0–0.1)
Basophils Relative: 1 %
Eosinophils Absolute: 0 10*3/uL (ref 0.0–0.5)
Eosinophils Relative: 0 %
HCT: 31 % — ABNORMAL LOW (ref 36.0–46.0)
Hemoglobin: 10.5 g/dL — ABNORMAL LOW (ref 12.0–15.0)
Immature Granulocytes: 1 %
Lymphocytes Relative: 9 %
Lymphs Abs: 0.4 10*3/uL — ABNORMAL LOW (ref 0.7–4.0)
MCH: 30.3 pg (ref 26.0–34.0)
MCHC: 33.9 g/dL (ref 30.0–36.0)
MCV: 89.3 fL (ref 80.0–100.0)
Monocytes Absolute: 0.8 10*3/uL (ref 0.1–1.0)
Monocytes Relative: 16 %
Neutro Abs: 3.5 10*3/uL (ref 1.7–7.7)
Neutrophils Relative %: 73 %
Platelet Count: 98 10*3/uL — ABNORMAL LOW (ref 150–400)
RBC: 3.47 MIL/uL — ABNORMAL LOW (ref 3.87–5.11)
RDW: 14.3 % (ref 11.5–15.5)
WBC Count: 4.8 10*3/uL (ref 4.0–10.5)
nRBC: 0 % (ref 0.0–0.2)

## 2019-11-07 LAB — CMP (CANCER CENTER ONLY)
ALT: 12 U/L (ref 0–44)
AST: 15 U/L (ref 15–41)
Albumin: 3.5 g/dL (ref 3.5–5.0)
Alkaline Phosphatase: 73 U/L (ref 38–126)
Anion gap: 10 (ref 5–15)
BUN: 25 mg/dL — ABNORMAL HIGH (ref 8–23)
CO2: 27 mmol/L (ref 22–32)
Calcium: 9 mg/dL (ref 8.9–10.3)
Chloride: 104 mmol/L (ref 98–111)
Creatinine: 0.89 mg/dL (ref 0.44–1.00)
GFR, Est AFR Am: >60 mL/min (ref >60)
GFR, Estimated: >60 mL/min (ref >60)
Glucose, Bld: 111 mg/dL — ABNORMAL HIGH (ref 70–99)
Potassium: 3.4 mmol/L — ABNORMAL LOW (ref 3.5–5.1)
Sodium: 141 mmol/L (ref 135–145)
Total Bilirubin: 0.8 mg/dL (ref 0.3–1.2)
Total Protein: 6.9 g/dL (ref 6.5–8.1)

## 2019-11-07 LAB — SAMPLE TO BLOOD BANK

## 2019-11-07 MED ORDER — SODIUM CHLORIDE 0.9% FLUSH
10.0000 mL | INTRAVENOUS | Status: DC | PRN
  Filled 2019-11-07: qty 10

## 2019-11-07 MED ORDER — SODIUM CHLORIDE 0.9% FLUSH
10.0000 mL | INTRAVENOUS | Status: DC | PRN
  Administered 2019-11-07: 09:00:00 10 mL via INTRAVENOUS
  Filled 2019-11-07: qty 10

## 2019-11-07 MED ORDER — HEPARIN SOD (PORK) LOCK FLUSH 100 UNIT/ML IV SOLN
500.0000 [IU] | Freq: Once | INTRAVENOUS | Status: AC
  Administered 2019-11-07: 250 [IU] via INTRAVENOUS
  Filled 2019-11-07: qty 5

## 2019-11-07 MED ORDER — SODIUM CHLORIDE 0.9 % IV SOLN
300.0000 mg/m2/d | INTRAVENOUS | Status: DC
  Administered 2019-11-07: 2650 mg via INTRAVENOUS
  Filled 2019-11-07: qty 53

## 2019-11-07 NOTE — Progress Notes (Addendum)
  [facility] OFFICE PROGRESS NOTE   Diagnosis: Anal cancer  INTERVAL HISTORY:   Ms. Kosiba returns as scheduled.  She continues radiation.  Energy level remains poor.  She is no longer having pain with swallowing.  She describes oral intake as "okay".  She continues to note an alteration in taste.  Bowels alternating constipation and diarrhea.  She has periodic nausea, typically after eating.  Objective:  Vital signs in last 24 hours:  Blood pressure (!) 117/97, pulse 91, temperature 98.5 F (36.9 C), temperature source Temporal, resp. rate 17, weight 225 lb (102.1 kg), SpO2 100 %.    HEENT: No thrush or ulcers. GI: Abdomen soft and nontender.  No hepatomegaly.  External hemorrhoids.  Linear ulceration right perianal region. Vascular: No leg edema. Neuro: Alert and oriented. Skin: Skin in the groin region is hyperpigmented, no skin breakdown. Right upper extremity PICC without erythema.  Lab Results:  Lab Results  Component Value Date   WBC 4.8 11/07/2019   HGB 10.5 (L) 11/07/2019   HCT 31.0 (L) 11/07/2019   MCV 89.3 11/07/2019   PLT 98 (L) 11/07/2019   NEUTROABS 3.5 11/07/2019    Imaging:  No results found.  Medications: I have reviewed the patient's current medications.  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 Biopsy 08/30/2019 confirmed well-differentiated squamous cell carcinoma with positive P 16 and p63 stains ? CTs 09/14/2019-abnormal soft tissue fullness at the lower anus/perianal soft tissues, asymmetric left inguinal lymphadenopathy, borderline enlarged left external iliac node, mild stranding and cutaneous thickening of the left gluteal fold, 2 to 3 mm pulmonary nodules, 1.6 cm right hepatic lesion-likely a hemangioma ? Began concurrent chemoRT with 5FU/mitomycin on 10/03/2019 ? Cycle 2 5-FU, dose reduced, mitomycin held 11/07/2019 2. COPD 3. Fibromyalgia 4. Chronic back pain 5. Hypothyroid 6. Depression 7. History  of uterine cancer-age 73 8. Pain secondary to #1 9. Orthostatic hypotension, fatigue, dyspnea on exertion, early mucositis requiring supportive care on day 9, secondary to chemotherapy  10. Thrombocytopenia PLT 58K and neutropenia ANC 1.4 on day 9, secondary to chemotherapy 11. Worsening cytopenias, PLT 8K and ANC 0.0 on day 15, secondary to chemotherapy. Started prophylaxis with cipro on 10/22 day 11 12. Hospital admission 10/17/2019 -fatigue, pancytopenia, hypokalemia   Disposition: Ms. Mateen appears stable.  She continues radiation.  Plan to proceed with the second cycle of chemotherapy today as scheduled.  She will receive infusional 5-FU alone at a reduced dose; mitomycin held.  We reviewed the CBC from today.  Counts adequate to proceed with treatment.  Mild thrombocytopenia.  She understands to contact the office with any bleeding.  She will return for labs in 1 week.  She will return for lab and follow-up on 11/16/2019.  She will contact the office in the interim as outlined above or with any other problems.  Patient seen with Dr. Benay Spice.    Ned Card ANP/GNP-BC   11/07/2019  9:46 AM This was a shared visit with Ned Card.  The white count has recovered and platelets are adequate to begin cycle 2 chemotherapy.  Mitomycin  will be discontinued and the 5-fluorouracil will be dose reduced.  She agrees to proceed.  We will check a day 8 CBC.  Julieanne Manson, MD

## 2019-11-07 NOTE — Patient Instructions (Signed)
[facility] Discharge Instructions for Patients Receiving Chemotherapy  Today you received the following chemotherapy agents 5 FU pump  To help prevent nausea and vomiting after your treatment, we encourage you to take your nausea medication as directed   If you develop nausea and vomiting that is not controlled by your nausea medication, call the clinic.   BELOW ARE SYMPTOMS THAT SHOULD BE REPORTED IMMEDIATELY:  *FEVER GREATER THAN 100.5 F  *CHILLS WITH OR WITHOUT FEVER  NAUSEA AND VOMITING THAT IS NOT CONTROLLED WITH YOUR NAUSEA MEDICATION  *UNUSUAL SHORTNESS OF BREATH  *UNUSUAL BRUISING OR BLEEDING  TENDERNESS IN MOUTH AND THROAT WITH OR WITHOUT PRESENCE OF ULCERS  *URINARY PROBLEMS  *BOWEL PROBLEMS  UNUSUAL RASH Items with * indicate a potential emergency and should be followed up as soon as possible.  Feel free to call the clinic should you have any questions or concerns. The clinic phone number is [phone].  Please show the CHEMO ALERT CARD at check-in to the Emergency Department and triage nurse.   

## 2019-11-07 NOTE — Telephone Encounter (Signed)
11/20 pump time not adjusted as requested per 11/16 schedule message. Per patient request pump remains as scheduled prior to xrt appointment.

## 2019-11-07 NOTE — Telephone Encounter (Signed)
[employer] called with blood bank hold. Pt Hgb 10.5. Provider Ned Card, NP, notified.

## 2019-11-08 ENCOUNTER — Other Ambulatory Visit: Payer: Self-pay

## 2019-11-08 ENCOUNTER — Telehealth: Payer: Self-pay | Admitting: Nurse Practitioner

## 2019-11-08 ENCOUNTER — Ambulatory Visit
Admission: RE | Admit: 2019-11-08 | Discharge: 2019-11-08 | Disposition: A | Discharge status: Home / Self Care | Payer: Medicare Other | Source: Ambulatory Visit | Attending: Radiation Oncology | Admitting: Radiation Oncology

## 2019-11-08 DIAGNOSIS — Z510 Encounter for antineoplastic radiation therapy: Secondary | ICD-10-CM | POA: Diagnosis not present

## 2019-11-08 DIAGNOSIS — C210 Malignant neoplasm of anus, unspecified: Secondary | ICD-10-CM | POA: Diagnosis not present

## 2019-11-08 NOTE — Telephone Encounter (Signed)
I talk with patient she said she would look at my chart for schedule

## 2019-11-09 ENCOUNTER — Telehealth: Payer: Self-pay | Admitting: Radiation Oncology

## 2019-11-09 ENCOUNTER — Other Ambulatory Visit: Payer: Self-pay

## 2019-11-09 ENCOUNTER — Telehealth: Payer: Self-pay | Admitting: Oncology

## 2019-11-09 ENCOUNTER — Ambulatory Visit
Admission: RE | Admit: 2019-11-09 | Discharge: 2019-11-09 | Disposition: A | Discharge status: Home / Self Care | Payer: Medicare Other | Source: Ambulatory Visit | Attending: Radiation Oncology | Admitting: Radiation Oncology

## 2019-11-09 DIAGNOSIS — C210 Malignant neoplasm of anus, unspecified: Secondary | ICD-10-CM | POA: Diagnosis not present

## 2019-11-09 DIAGNOSIS — Z510 Encounter for antineoplastic radiation therapy: Secondary | ICD-10-CM | POA: Diagnosis not present

## 2019-11-09 NOTE — Telephone Encounter (Signed)
Returned patient's phone call regarding rescheduling an appointment, left a voicemail. 

## 2019-11-09 NOTE — Telephone Encounter (Signed)
The patient called and said that she was having skin concerns in the vulvar area. I let her know I'd be happy to evaluate her concerns when she comes in for treatment this afternoon.

## 2019-11-10 ENCOUNTER — Ambulatory Visit
Admission: RE | Admit: 2019-11-10 | Discharge: 2019-11-10 | Disposition: A | Discharge status: Home / Self Care | Payer: Medicare Other | Source: Ambulatory Visit | Attending: Radiation Oncology | Admitting: Radiation Oncology

## 2019-11-10 ENCOUNTER — Other Ambulatory Visit: Payer: Self-pay

## 2019-11-10 DIAGNOSIS — Z510 Encounter for antineoplastic radiation therapy: Secondary | ICD-10-CM | POA: Diagnosis not present

## 2019-11-10 DIAGNOSIS — C210 Malignant neoplasm of anus, unspecified: Secondary | ICD-10-CM | POA: Diagnosis not present

## 2019-11-11 ENCOUNTER — Other Ambulatory Visit: Payer: Self-pay

## 2019-11-11 ENCOUNTER — Ambulatory Visit
Admission: RE | Admit: 2019-11-11 | Discharge: 2019-11-11 | Disposition: A | Discharge status: Home / Self Care | Payer: Medicare Other | Source: Ambulatory Visit | Attending: Radiation Oncology | Admitting: Radiation Oncology

## 2019-11-11 ENCOUNTER — Ambulatory Visit: Payer: Medicare Other

## 2019-11-11 ENCOUNTER — Inpatient Hospital Stay: Payer: Medicare Other

## 2019-11-11 DIAGNOSIS — C210 Malignant neoplasm of anus, unspecified: Secondary | ICD-10-CM

## 2019-11-11 DIAGNOSIS — J449 Chronic obstructive pulmonary disease, unspecified: Secondary | ICD-10-CM | POA: Diagnosis not present

## 2019-11-11 DIAGNOSIS — M549 Dorsalgia, unspecified: Secondary | ICD-10-CM | POA: Diagnosis not present

## 2019-11-11 DIAGNOSIS — I951 Orthostatic hypotension: Secondary | ICD-10-CM | POA: Diagnosis not present

## 2019-11-11 DIAGNOSIS — G8929 Other chronic pain: Secondary | ICD-10-CM | POA: Diagnosis not present

## 2019-11-11 DIAGNOSIS — E039 Hypothyroidism, unspecified: Secondary | ICD-10-CM | POA: Diagnosis not present

## 2019-11-11 DIAGNOSIS — T451X5A Adverse effect of antineoplastic and immunosuppressive drugs, initial encounter: Secondary | ICD-10-CM | POA: Diagnosis not present

## 2019-11-11 DIAGNOSIS — D6959 Other secondary thrombocytopenia: Secondary | ICD-10-CM | POA: Diagnosis not present

## 2019-11-11 DIAGNOSIS — Z5111 Encounter for antineoplastic chemotherapy: Secondary | ICD-10-CM | POA: Diagnosis not present

## 2019-11-11 DIAGNOSIS — D701 Agranulocytosis secondary to cancer chemotherapy: Secondary | ICD-10-CM | POA: Diagnosis not present

## 2019-11-11 DIAGNOSIS — M797 Fibromyalgia: Secondary | ICD-10-CM | POA: Diagnosis not present

## 2019-11-11 DIAGNOSIS — Z79899 Other long term (current) drug therapy: Secondary | ICD-10-CM | POA: Diagnosis not present

## 2019-11-11 DIAGNOSIS — Z510 Encounter for antineoplastic radiation therapy: Secondary | ICD-10-CM | POA: Diagnosis not present

## 2019-11-11 MED ORDER — SODIUM CHLORIDE 0.9% FLUSH
10.0000 mL | INTRAVENOUS | Status: DC | PRN
  Administered 2019-11-11: 10 mL
  Filled 2019-11-11: qty 10

## 2019-11-11 MED ORDER — HEPARIN SOD (PORK) LOCK FLUSH 100 UNIT/ML IV SOLN
250.0000 [IU] | Freq: Once | INTRAVENOUS | Status: AC | PRN
  Administered 2019-11-11: 250 [IU]
  Filled 2019-11-11: qty 5

## 2019-11-14 ENCOUNTER — Inpatient Hospital Stay: Payer: Medicare Other

## 2019-11-14 ENCOUNTER — Telehealth: Payer: Self-pay | Admitting: *Deleted

## 2019-11-14 ENCOUNTER — Ambulatory Visit: Payer: Medicare Other

## 2019-11-14 ENCOUNTER — Other Ambulatory Visit: Payer: Self-pay

## 2019-11-14 ENCOUNTER — Ambulatory Visit
Admission: RE | Admit: 2019-11-14 | Discharge: 2019-11-14 | Disposition: A | Discharge status: Home / Self Care | Payer: Medicare Other | Source: Ambulatory Visit | Attending: Radiation Oncology | Admitting: Radiation Oncology

## 2019-11-14 DIAGNOSIS — M549 Dorsalgia, unspecified: Secondary | ICD-10-CM | POA: Diagnosis not present

## 2019-11-14 DIAGNOSIS — Z510 Encounter for antineoplastic radiation therapy: Secondary | ICD-10-CM | POA: Diagnosis not present

## 2019-11-14 DIAGNOSIS — C210 Malignant neoplasm of anus, unspecified: Secondary | ICD-10-CM

## 2019-11-14 DIAGNOSIS — D6959 Other secondary thrombocytopenia: Secondary | ICD-10-CM | POA: Diagnosis not present

## 2019-11-14 DIAGNOSIS — J449 Chronic obstructive pulmonary disease, unspecified: Secondary | ICD-10-CM | POA: Diagnosis not present

## 2019-11-14 DIAGNOSIS — E039 Hypothyroidism, unspecified: Secondary | ICD-10-CM | POA: Diagnosis not present

## 2019-11-14 DIAGNOSIS — D701 Agranulocytosis secondary to cancer chemotherapy: Secondary | ICD-10-CM | POA: Diagnosis not present

## 2019-11-14 DIAGNOSIS — T451X5A Adverse effect of antineoplastic and immunosuppressive drugs, initial encounter: Secondary | ICD-10-CM | POA: Diagnosis not present

## 2019-11-14 DIAGNOSIS — Z5111 Encounter for antineoplastic chemotherapy: Secondary | ICD-10-CM | POA: Diagnosis not present

## 2019-11-14 DIAGNOSIS — Z95828 Presence of other vascular implants and grafts: Secondary | ICD-10-CM

## 2019-11-14 DIAGNOSIS — G8929 Other chronic pain: Secondary | ICD-10-CM | POA: Diagnosis not present

## 2019-11-14 DIAGNOSIS — I951 Orthostatic hypotension: Secondary | ICD-10-CM | POA: Diagnosis not present

## 2019-11-14 DIAGNOSIS — Z79899 Other long term (current) drug therapy: Secondary | ICD-10-CM | POA: Diagnosis not present

## 2019-11-14 DIAGNOSIS — M797 Fibromyalgia: Secondary | ICD-10-CM | POA: Diagnosis not present

## 2019-11-14 LAB — CBC WITH DIFFERENTIAL (CANCER CENTER ONLY)
Abs Immature Granulocytes: 0.03 10*3/uL (ref 0.00–0.07)
Basophils Absolute: 0 10*3/uL (ref 0.0–0.1)
Basophils Relative: 0 %
Eosinophils Absolute: 0.1 10*3/uL (ref 0.0–0.5)
Eosinophils Relative: 3 %
HCT: 27.6 % — ABNORMAL LOW (ref 36.0–46.0)
Hemoglobin: 9.7 g/dL — ABNORMAL LOW (ref 12.0–15.0)
Immature Granulocytes: 1 %
Lymphocytes Relative: 10 %
Lymphs Abs: 0.3 10*3/uL — ABNORMAL LOW (ref 0.7–4.0)
MCH: 30.4 pg (ref 26.0–34.0)
MCHC: 35.1 g/dL (ref 30.0–36.0)
MCV: 86.5 fL (ref 80.0–100.0)
Monocytes Absolute: 0.3 10*3/uL (ref 0.1–1.0)
Monocytes Relative: 10 %
Neutro Abs: 2.2 10*3/uL (ref 1.7–7.7)
Neutrophils Relative %: 76 %
Platelet Count: 45 10*3/uL — ABNORMAL LOW (ref 150–400)
RBC: 3.19 MIL/uL — ABNORMAL LOW (ref 3.87–5.11)
RDW: 14.5 % (ref 11.5–15.5)
WBC Count: 2.9 10*3/uL — ABNORMAL LOW (ref 4.0–10.5)
nRBC: 0 % (ref 0.0–0.2)

## 2019-11-14 LAB — SAMPLE TO BLOOD BANK

## 2019-11-14 MED ORDER — HEPARIN SOD (PORK) LOCK FLUSH 100 UNIT/ML IV SOLN
500.0000 [IU] | Freq: Once | INTRAVENOUS | Status: AC
  Administered 2019-11-14: 500 [IU] via INTRAVENOUS
  Filled 2019-11-14: qty 5

## 2019-11-14 MED ORDER — SODIUM CHLORIDE 0.9% FLUSH
10.0000 mL | Freq: Once | INTRAVENOUS | Status: AC
  Administered 2019-11-14: 10 mL via INTRAVENOUS
  Filled 2019-11-14: qty 10

## 2019-11-14 NOTE — Telephone Encounter (Signed)
Per Ned Card, NP, called to notify pt platelet counts were low. Unable to reach pt. Sent message on MyChart (patient preference). Advised if there were any bleeding or concerns to call office.

## 2019-11-14 NOTE — Telephone Encounter (Signed)
[employer], called with blood bank hold. Pt Hgb 9.5. Provider Ned Card, NP, made aware. Pt does not need blood transfusion.

## 2019-11-15 ENCOUNTER — Ambulatory Visit: Payer: Medicare Other

## 2019-11-15 ENCOUNTER — Ambulatory Visit
Admission: RE | Admit: 2019-11-15 | Discharge: 2019-11-15 | Disposition: A | Discharge status: Home / Self Care | Payer: Medicare Other | Source: Ambulatory Visit | Attending: Radiation Oncology | Admitting: Radiation Oncology

## 2019-11-15 ENCOUNTER — Other Ambulatory Visit: Payer: Self-pay

## 2019-11-15 DIAGNOSIS — C210 Malignant neoplasm of anus, unspecified: Secondary | ICD-10-CM | POA: Diagnosis not present

## 2019-11-15 DIAGNOSIS — Z510 Encounter for antineoplastic radiation therapy: Secondary | ICD-10-CM | POA: Diagnosis not present

## 2019-11-16 ENCOUNTER — Telehealth: Payer: Self-pay | Admitting: Oncology

## 2019-11-16 ENCOUNTER — Inpatient Hospital Stay: Payer: Medicare Other

## 2019-11-16 ENCOUNTER — Other Ambulatory Visit: Payer: Self-pay | Admitting: Nurse Practitioner

## 2019-11-16 ENCOUNTER — Emergency Department (HOSPITAL_COMMUNITY): Payer: Medicare Other

## 2019-11-16 ENCOUNTER — Telehealth: Payer: Self-pay

## 2019-11-16 ENCOUNTER — Other Ambulatory Visit: Payer: Self-pay

## 2019-11-16 ENCOUNTER — Ambulatory Visit: Payer: Medicare Other

## 2019-11-16 ENCOUNTER — Emergency Department (HOSPITAL_COMMUNITY)
Admission: EM | Admit: 2019-11-16 | Discharge: 2019-11-16 | Disposition: A | Discharge status: Home / Self Care | Payer: Medicare Other | Attending: Emergency Medicine | Admitting: Emergency Medicine

## 2019-11-16 ENCOUNTER — Ambulatory Visit
Admission: RE | Admit: 2019-11-16 | Discharge: 2019-11-16 | Disposition: A | Discharge status: Home / Self Care | Payer: Medicare Other | Source: Ambulatory Visit | Attending: Radiation Oncology | Admitting: Radiation Oncology

## 2019-11-16 ENCOUNTER — Inpatient Hospital Stay: Payer: Medicare Other | Admitting: Nurse Practitioner

## 2019-11-16 ENCOUNTER — Encounter (HOSPITAL_COMMUNITY): Payer: Self-pay

## 2019-11-16 DIAGNOSIS — Z743 Need for continuous supervision: Secondary | ICD-10-CM | POA: Diagnosis not present

## 2019-11-16 DIAGNOSIS — Y999 Unspecified external cause status: Secondary | ICD-10-CM | POA: Insufficient documentation

## 2019-11-16 DIAGNOSIS — W19XXXA Unspecified fall, initial encounter: Secondary | ICD-10-CM | POA: Insufficient documentation

## 2019-11-16 DIAGNOSIS — I959 Hypotension, unspecified: Secondary | ICD-10-CM | POA: Diagnosis not present

## 2019-11-16 DIAGNOSIS — Z79899 Other long term (current) drug therapy: Secondary | ICD-10-CM | POA: Diagnosis not present

## 2019-11-16 DIAGNOSIS — Z87891 Personal history of nicotine dependence: Secondary | ICD-10-CM | POA: Diagnosis not present

## 2019-11-16 DIAGNOSIS — E039 Hypothyroidism, unspecified: Secondary | ICD-10-CM | POA: Insufficient documentation

## 2019-11-16 DIAGNOSIS — C210 Malignant neoplasm of anus, unspecified: Secondary | ICD-10-CM

## 2019-11-16 DIAGNOSIS — Z85048 Personal history of other malignant neoplasm of rectum, rectosigmoid junction, and anus: Secondary | ICD-10-CM | POA: Diagnosis not present

## 2019-11-16 DIAGNOSIS — Y929 Unspecified place or not applicable: Secondary | ICD-10-CM | POA: Insufficient documentation

## 2019-11-16 DIAGNOSIS — E860 Dehydration: Secondary | ICD-10-CM | POA: Diagnosis not present

## 2019-11-16 DIAGNOSIS — Z8542 Personal history of malignant neoplasm of other parts of uterus: Secondary | ICD-10-CM | POA: Diagnosis not present

## 2019-11-16 DIAGNOSIS — Y939 Activity, unspecified: Secondary | ICD-10-CM | POA: Insufficient documentation

## 2019-11-16 DIAGNOSIS — E876 Hypokalemia: Secondary | ICD-10-CM | POA: Insufficient documentation

## 2019-11-16 DIAGNOSIS — S80212A Abrasion, left knee, initial encounter: Secondary | ICD-10-CM | POA: Insufficient documentation

## 2019-11-16 DIAGNOSIS — R55 Syncope and collapse: Secondary | ICD-10-CM | POA: Diagnosis not present

## 2019-11-16 LAB — CBC WITH DIFFERENTIAL/PLATELET
Abs Immature Granulocytes: 0.02 10*3/uL (ref 0.00–0.07)
Basophils Absolute: 0 10*3/uL (ref 0.0–0.1)
Basophils Relative: 0 %
Eosinophils Absolute: 0.1 10*3/uL (ref 0.0–0.5)
Eosinophils Relative: 3 %
HCT: 27.7 % — ABNORMAL LOW (ref 36.0–46.0)
Hemoglobin: 9.4 g/dL — ABNORMAL LOW (ref 12.0–15.0)
Immature Granulocytes: 1 %
Lymphocytes Relative: 5 %
Lymphs Abs: 0.2 10*3/uL — ABNORMAL LOW (ref 0.7–4.0)
MCH: 30.8 pg (ref 26.0–34.0)
MCHC: 33.9 g/dL (ref 30.0–36.0)
MCV: 90.8 fL (ref 80.0–100.0)
Monocytes Absolute: 0.4 10*3/uL (ref 0.1–1.0)
Monocytes Relative: 10 %
Neutro Abs: 3.2 10*3/uL (ref 1.7–7.7)
Neutrophils Relative %: 81 %
Platelets: 34 10*3/uL — ABNORMAL LOW (ref 150–400)
RBC: 3.05 MIL/uL — ABNORMAL LOW (ref 3.87–5.11)
RDW: 15.5 % (ref 11.5–15.5)
WBC: 3.9 10*3/uL — ABNORMAL LOW (ref 4.0–10.5)
nRBC: 0 % (ref 0.0–0.2)

## 2019-11-16 LAB — LACTIC ACID, PLASMA: Lactic Acid, Venous: 1.3 mmol/L (ref 0.5–1.9)

## 2019-11-16 LAB — BASIC METABOLIC PANEL
Anion gap: 9 (ref 5–15)
BUN: 21 mg/dL (ref 8–23)
CO2: 24 mmol/L (ref 22–32)
Calcium: 8.2 mg/dL — ABNORMAL LOW (ref 8.9–10.3)
Chloride: 108 mmol/L (ref 98–111)
Creatinine, Ser: 0.91 mg/dL (ref 0.44–1.00)
GFR calc Af Amer: >60 mL/min (ref >60)
GFR calc non Af Amer: >60 mL/min (ref >60)
Glucose, Bld: 111 mg/dL — ABNORMAL HIGH (ref 70–99)
Potassium: 2.9 mmol/L — ABNORMAL LOW (ref 3.5–5.1)
Sodium: 141 mmol/L (ref 135–145)

## 2019-11-16 LAB — TYPE AND SCREEN
ABO/RH(D): A NEG
Antibody Screen: NEGATIVE

## 2019-11-16 LAB — TROPONIN I (HIGH SENSITIVITY): Troponin I (High Sensitivity): 10 ng/L (ref <18)

## 2019-11-16 MED ORDER — SODIUM CHLORIDE 0.9 % IV BOLUS
1000.0000 mL | Freq: Once | INTRAVENOUS | Status: AC
  Administered 2019-11-16: 1000 mL via INTRAVENOUS

## 2019-11-16 MED ORDER — LOPERAMIDE HCL 2 MG PO CAPS
2.0000 mg | ORAL_CAPSULE | ORAL | Status: DC | PRN
  Administered 2019-11-16: 2 mg via ORAL
  Filled 2019-11-16: qty 1

## 2019-11-16 MED ORDER — POTASSIUM CHLORIDE CRYS ER 20 MEQ PO TBCR
40.0000 meq | EXTENDED_RELEASE_TABLET | Freq: Once | ORAL | Status: AC
  Administered 2019-11-16: 40 meq via ORAL
  Filled 2019-11-16: qty 2

## 2019-11-16 MED ORDER — LOPERAMIDE HCL 2 MG PO CAPS
2.0000 mg | ORAL_CAPSULE | ORAL | Status: DC | PRN
  Filled 2019-11-16: qty 1

## 2019-11-16 MED ORDER — LOPERAMIDE HCL 2 MG PO CAPS: 2.0000 mg | ORAL_CAPSULE | ORAL | 0 refills | Status: DC | PRN

## 2019-11-16 NOTE — Discharge Instructions (Signed)
Please go to your radiation oncology appointment this afternoon, please go to your primary medical oncologist office Friday for recheck.  If you have any episodes of passing out, chest pain, difficulty breathing, please return to ER.

## 2019-11-16 NOTE — Telephone Encounter (Signed)
Scheduled appt per 11/25 sch message - pt daughter is aware of appt date and time   

## 2019-11-16 NOTE — ED Provider Notes (Signed)
[facility] DEPT Provider Note   CSN: [id number] Arrival date & time: 11/16/19  1141     History   Chief Complaint No chief complaint on file.   HPI Joanne Klein is a 73 y.o. female.  Presents emergency room with chief complaint of near syncopal episode.  Patient states she had near syncopal episode, felt very lightheaded after standing up suddenly, did not fully lose consciousness, did not hit her head, hit her left knee.  Time of interview, she has no ongoing symptoms.  No associated chest pain.  No recent fevers, no cough, no abdominal pain or vomiting or nausea.  Past medical history anal squamous cell carcinoma recently finished chemo, ongoing radiation.     HPI  Past Medical History:  Diagnosis Date  . Allergic rhinitis   . Arthritis   . Bursitis    Both Shoulders  . Chronic low back pain   . Degenerative disk disease   . Depression   . Diverticulitis   . Emphysema of lung ([place])   . GERD (gastroesophageal reflux disease)   . History of fibromyalgia   . Hypertension   . Hypothyroidism   . Irritable bowel syndrome   . Neuralgia, post-herpetic   . Obesity, unspecified   . Obstructive sleep apnea   . OCD (obsessive compulsive disorder)   . Pain in joint, site unspecified   . Pure hypercholesterolemia   . Scoliosis   . Situational stress   . Spastic colon   . Tubular adenoma   . Uterine cancer [facility]     Patient Active Problem List   Diagnosis Date Noted  . Chemotherapy-induced neutropenia ([place])   . Thrombocytopenia ([place]) 10/17/2019  . Anal cancer ([place]) 09/15/2019  . Routine general medical examination at a health care facility 11/19/2016  . 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 Morbid obesity ([place]) 06/26/2014  . COPD GOLD II    . Hyperlipidemia   . Arthritis 01/16/2014  . Depression 01/13/2014  . Hypothyroidism 01/13/2014  . GERD (gastroesophageal reflux disease) 01/13/2014  . OCD (obsessive compulsive disorder) 01/13/2014  .  Fibromyalgia 01/13/2014    Past Surgical History:  Procedure Laterality Date  . BREAST LUMPECTOMY     knot removed  . CHOLECYSTECTOMY    . HYSTEROTOMY    . TONSILLECTOMY    . tumor in left forearm       OB History   No obstetric history on file.      Home Medications    Prior to Admission medications   Medication Sig Start Date End Date Taking? Authorizing Provider  atorvastatin (LIPITOR) 20 MG tablet Take 1 tablet (20 mg total) by mouth every other day. Patient taking differently: Take 20 mg by mouth every other day. MWF 02/17/19   Hoyt Koch, MD  Cholecalciferol (VITAMIN D-3) 1000 UNITS CAPS Take 1 capsule by mouth daily.     [provider]  levothyroxine (SYNTHROID) 75 MCG tablet TAKE 1 TABLET BY MOUTH  DAILY BEFORE BREAKFAST Patient taking differently: Take 75 mcg by mouth daily before breakfast.  08/31/19   Hoyt Koch, MD  omeprazole (PRILOSEC) 40 MG capsule TAKE 1 CAPSULE BY MOUTH  DAILY 04/18/19   Hoyt Koch, MD  oxyCODONE-acetaminophen (PERCOCET) 10-325 MG tablet Take 1 tablet by mouth every 4 (four) hours as needed for pain. Gets from another provider @@ 180 per month    [provider]  sertraline (ZOLOFT) 100 MG tablet Take 2 tablets (200 mg total) by mouth at  bedtime. 02/17/19   Hoyt Koch, MD    Family History Family History  Problem Relation Age of Onset  . Stomach cancer Father   . Liver cancer Father   . Diabetes Father   . Colon cancer Neg Hx     Social History Social History   Tobacco Use  . Smoking status: Former Smoker    Packs/day: 2.00    Years: 34.00    Pack years: 68.00    Types: Cigarettes    Quit date: 12/15/1996    Years since quitting: 22.9  . Smokeless tobacco: Never Used  . Tobacco comment: vapes x 2 years  Substance Use Topics  . Alcohol use: No    Alcohol/week: 0.0 standard drinks    Comment: Caffine Intake 2x's daily  . Drug use: No     Allergies   Prozac  [fluoxetine hcl], Codeine, Morphine and related, and Sulfa antibiotics   Review of Systems Review of Systems  Constitutional: Positive for fatigue. Negative for chills and fever.  HENT: Negative for ear pain and sore throat.   Eyes: Negative for pain and visual disturbance.  Respiratory: Negative for cough and shortness of breath.   Cardiovascular: Negative for chest pain and palpitations.  Gastrointestinal: Negative for abdominal pain and vomiting.  Genitourinary: Negative for dysuria and hematuria.  Musculoskeletal: Negative for arthralgias and back pain.  Skin: Negative for color change and rash.  Neurological: Negative for seizures and syncope.  All other systems reviewed and are negative.    Physical Exam Updated Vital Signs There were no vitals taken for this visit.  Physical Exam Vitals signs and nursing note reviewed.  Constitutional:      General: She is not in acute distress.    Appearance: She is well-developed.  HENT:     Head: Normocephalic and atraumatic.  Eyes:     Conjunctiva/sclera: Conjunctivae normal.  Neck:     Musculoskeletal: Neck supple.  Cardiovascular:     Rate and Rhythm: Normal rate and regular rhythm.     Heart sounds: No murmur.  Pulmonary:     Effort: Pulmonary effort is normal. No respiratory distress.     Breath sounds: Normal breath sounds.  Abdominal:     Palpations: Abdomen is soft.     Tenderness: There is no abdominal tenderness.  Musculoskeletal:        General: No swelling or tenderness.  Skin:    General: Skin is warm and dry.     Capillary Refill: Capillary refill takes less than 2 seconds.  Neurological:     General: No focal deficit present.     Mental Status: She is alert and oriented to person, place, and time.  Psychiatric:        Mood and Affect: Mood normal.        Behavior: Behavior normal.      ED Treatments / Results  Labs (all labs ordered are listed, but only abnormal results are displayed) Labs Reviewed  - No data to display  EKG None  Radiology No results found.  Procedures Procedures (including critical care time)  Medications Ordered in ED Medications - No data to display   Initial Impression / Assessment and Plan / ED Course  I have reviewed the triage vital signs and the nursing notes.  Pertinent labs & imaging results that were available during my care of the patient were reviewed by me and considered in my medical decision making (see chart for details).  Clinical Course as of Nov 17 2035  Wed Nov 16, 2019  1337 Platelets(!): 34 [RD]  1337 Hemoglobin(!): 9.4 [RD]  1337 Pancytopenia, no significant change from prior  WBC(!): 3.9 [RD]  1345 Discussed with Dr. Learta Codding, patient's oncologist, reviewed labs with him - agrees with hydrating, if patient remains well appearing, and asymptomatic, he thinks likely ok for discharge, can see patient on Friday and recheck labs   [RD]    Clinical Course User Index [RD] Lucrezia Starch, MD       73 year old lady history of squamous all carcinoma of the anal margin recently finished chemotherapy, plans for ongoing radiation presented to the ER after near syncopal episode.  Suspect this is related to dehydration, orthostasis.  On exam she was well-appearing, no ongoing symptoms.  EKG without new concerning findings. Labs with no significant change from baseline.  Discussed case with her oncologist who she was supposed to see later today.  Provided patient fluids given suspicion for dehydration.  Discharged with plan to keep her radiation appointment later today, follow-up with medical oncology on Friday.  Reviewed return precautions with patient, will discharge.    After the discussed management above, the patient was determined to be safe for discharge.  The patient was in agreement with this plan and all questions regarding their care were answered.  ED return precautions were discussed and the patient will return to the ED with any  significant worsening of condition.   Final Clinical Impressions(s) / ED Diagnoses   Final diagnoses:  Dehydration  Hypokalemia    ED Discharge Orders    None       Lucrezia Starch, MD 11/17/19 2040

## 2019-11-16 NOTE — Telephone Encounter (Signed)
Notified that patient's niece requested a call back regarding patient's status. Returned Mrs. Snell's call and was informed that patient had a syncopal episode while trying to move from her recliner to the bed. Patient's niece stated that EMT was called and noted that patient was orthostatic and appeared dehydrated. Niece was concerned about patient missing lab/flush/provider/radiation appointments today, and wanted to know what they needed to do. Told niece I would consult with Dr. Lisbeth Renshaw and patient's med-onc team, and call her back with their recommendations.   Updated Dr. Lisbeth Renshaw who advised that patient not worry about coming to her radiation appointment today. He stated today's treatment could be added on at the end of her regimen to keep her on track. Spoke with Porsche Cates-LPN who stated that patient's med-onc appointments would be rescheduled once patient was discharged from ED. Spoke with Nick-RT on L4 to let them know patient would not be coming for treatment today  Called patient's niece back and conveyed above information. Niece verbalized understanding and agreement, and denied any other needs at this time.

## 2019-11-16 NOTE — ED Triage Notes (Signed)
EMS reports from home, called out for hypotension and near syncopal episode with fall. Pt denies pain or injury from fall. EMS reports orthostatic hypotension on arrival. CA pt post Chemo and in radiation daily x 6 weeks. Pt normally on 3lts at home.  BP Supine 123/73 HR 86       Standing 84/52 HR 106  RR 20 Sp02 100 on 3ltrs CBG 151 Temp 98.1  20 L Hand PICC R bicep  555ml NS enroute

## 2019-11-17 ENCOUNTER — Ambulatory Visit: Payer: Medicare Other

## 2019-11-18 ENCOUNTER — Inpatient Hospital Stay: Payer: Medicare Other

## 2019-11-18 ENCOUNTER — Ambulatory Visit: Payer: Medicare Other

## 2019-11-18 ENCOUNTER — Other Ambulatory Visit: Payer: Self-pay

## 2019-11-18 ENCOUNTER — Telehealth: Payer: Self-pay | Admitting: Oncology

## 2019-11-18 ENCOUNTER — Ambulatory Visit: Payer: Medicare Other | Admitting: Nurse Practitioner

## 2019-11-18 ENCOUNTER — Telehealth: Payer: Self-pay | Admitting: *Deleted

## 2019-11-18 DIAGNOSIS — Z79899 Other long term (current) drug therapy: Secondary | ICD-10-CM | POA: Diagnosis not present

## 2019-11-18 DIAGNOSIS — C210 Malignant neoplasm of anus, unspecified: Secondary | ICD-10-CM | POA: Diagnosis not present

## 2019-11-18 DIAGNOSIS — J449 Chronic obstructive pulmonary disease, unspecified: Secondary | ICD-10-CM | POA: Diagnosis not present

## 2019-11-18 DIAGNOSIS — M549 Dorsalgia, unspecified: Secondary | ICD-10-CM | POA: Diagnosis not present

## 2019-11-18 DIAGNOSIS — T451X5A Adverse effect of antineoplastic and immunosuppressive drugs, initial encounter: Secondary | ICD-10-CM | POA: Diagnosis not present

## 2019-11-18 DIAGNOSIS — E039 Hypothyroidism, unspecified: Secondary | ICD-10-CM | POA: Diagnosis not present

## 2019-11-18 DIAGNOSIS — Z5111 Encounter for antineoplastic chemotherapy: Secondary | ICD-10-CM | POA: Diagnosis not present

## 2019-11-18 DIAGNOSIS — D6959 Other secondary thrombocytopenia: Secondary | ICD-10-CM | POA: Diagnosis not present

## 2019-11-18 DIAGNOSIS — G8929 Other chronic pain: Secondary | ICD-10-CM | POA: Diagnosis not present

## 2019-11-18 DIAGNOSIS — I951 Orthostatic hypotension: Secondary | ICD-10-CM | POA: Diagnosis not present

## 2019-11-18 DIAGNOSIS — D701 Agranulocytosis secondary to cancer chemotherapy: Secondary | ICD-10-CM | POA: Diagnosis not present

## 2019-11-18 DIAGNOSIS — Z95828 Presence of other vascular implants and grafts: Secondary | ICD-10-CM

## 2019-11-18 DIAGNOSIS — M797 Fibromyalgia: Secondary | ICD-10-CM | POA: Diagnosis not present

## 2019-11-18 LAB — CBC WITH DIFFERENTIAL (CANCER CENTER ONLY)
Abs Immature Granulocytes: 0.02 10*3/uL (ref 0.00–0.07)
Basophils Absolute: 0 10*3/uL (ref 0.0–0.1)
Basophils Relative: 0 %
Eosinophils Absolute: 0.2 10*3/uL (ref 0.0–0.5)
Eosinophils Relative: 8 %
HCT: 24.1 % — ABNORMAL LOW (ref 36.0–46.0)
Hemoglobin: 8.2 g/dL — ABNORMAL LOW (ref 12.0–15.0)
Immature Granulocytes: 1 %
Lymphocytes Relative: 7 %
Lymphs Abs: 0.2 10*3/uL — ABNORMAL LOW (ref 0.7–4.0)
MCH: 30.6 pg (ref 26.0–34.0)
MCHC: 34 g/dL (ref 30.0–36.0)
MCV: 89.9 fL (ref 80.0–100.0)
Monocytes Absolute: 0.2 10*3/uL (ref 0.1–1.0)
Monocytes Relative: 8 %
Neutro Abs: 2.3 10*3/uL (ref 1.7–7.7)
Neutrophils Relative %: 76 %
Platelet Count: 26 10*3/uL — ABNORMAL LOW (ref 150–400)
RBC: 2.68 MIL/uL — ABNORMAL LOW (ref 3.87–5.11)
RDW: 16.7 % — ABNORMAL HIGH (ref 11.5–15.5)
WBC Count: 3 10*3/uL — ABNORMAL LOW (ref 4.0–10.5)
nRBC: 0 % (ref 0.0–0.2)

## 2019-11-18 MED ORDER — SODIUM CHLORIDE 0.9% FLUSH
10.0000 mL | INTRAVENOUS | Status: DC | PRN
  Administered 2019-11-18: 11:00:00 10 mL via INTRAVENOUS
  Filled 2019-11-18: qty 10

## 2019-11-18 MED ORDER — HEPARIN SOD (PORK) LOCK FLUSH 100 UNIT/ML IV SOLN
500.0000 [IU] | Freq: Once | INTRAVENOUS | Status: AC
  Administered 2019-11-18: 250 [IU] via INTRAVENOUS
  Filled 2019-11-18: qty 5

## 2019-11-18 NOTE — Telephone Encounter (Signed)
Confirmed 11/30 appointment with dtr. Dtr would like to be contacted regarding lab results. Message to provider.

## 2019-11-18 NOTE — Telephone Encounter (Signed)
Per Dr. Benay Spice, called pt to recommend coming in for labs today due to low PLT count and risk for bleeding. Pt verbalized understanding. Scheduling message sent and appt made for labs/flush. Pt made aware of time of appt and knows to wait for labs to result.

## 2019-11-21 ENCOUNTER — Telehealth: Payer: Self-pay | Admitting: *Deleted

## 2019-11-21 ENCOUNTER — Other Ambulatory Visit: Payer: Self-pay

## 2019-11-21 ENCOUNTER — Telehealth: Payer: Self-pay | Admitting: Oncology

## 2019-11-21 ENCOUNTER — Ambulatory Visit
Admission: RE | Admit: 2019-11-21 | Discharge: 2019-11-21 | Disposition: A | Discharge status: Home / Self Care | Payer: Medicare Other | Source: Ambulatory Visit | Attending: Radiation Oncology | Admitting: Radiation Oncology

## 2019-11-21 ENCOUNTER — Inpatient Hospital Stay: Payer: Medicare Other

## 2019-11-21 ENCOUNTER — Ambulatory Visit: Payer: Medicare Other

## 2019-11-21 DIAGNOSIS — M549 Dorsalgia, unspecified: Secondary | ICD-10-CM | POA: Diagnosis not present

## 2019-11-21 DIAGNOSIS — C210 Malignant neoplasm of anus, unspecified: Secondary | ICD-10-CM | POA: Diagnosis not present

## 2019-11-21 DIAGNOSIS — G8929 Other chronic pain: Secondary | ICD-10-CM | POA: Diagnosis not present

## 2019-11-21 DIAGNOSIS — M797 Fibromyalgia: Secondary | ICD-10-CM | POA: Diagnosis not present

## 2019-11-21 DIAGNOSIS — Z79899 Other long term (current) drug therapy: Secondary | ICD-10-CM | POA: Diagnosis not present

## 2019-11-21 DIAGNOSIS — J449 Chronic obstructive pulmonary disease, unspecified: Secondary | ICD-10-CM | POA: Diagnosis not present

## 2019-11-21 DIAGNOSIS — Z510 Encounter for antineoplastic radiation therapy: Secondary | ICD-10-CM | POA: Diagnosis not present

## 2019-11-21 DIAGNOSIS — E039 Hypothyroidism, unspecified: Secondary | ICD-10-CM | POA: Diagnosis not present

## 2019-11-21 DIAGNOSIS — I951 Orthostatic hypotension: Secondary | ICD-10-CM | POA: Diagnosis not present

## 2019-11-21 DIAGNOSIS — Z5111 Encounter for antineoplastic chemotherapy: Secondary | ICD-10-CM | POA: Diagnosis not present

## 2019-11-21 DIAGNOSIS — D6959 Other secondary thrombocytopenia: Secondary | ICD-10-CM | POA: Diagnosis not present

## 2019-11-21 DIAGNOSIS — T451X5A Adverse effect of antineoplastic and immunosuppressive drugs, initial encounter: Secondary | ICD-10-CM | POA: Diagnosis not present

## 2019-11-21 DIAGNOSIS — D701 Agranulocytosis secondary to cancer chemotherapy: Secondary | ICD-10-CM | POA: Diagnosis not present

## 2019-11-21 DIAGNOSIS — Z95828 Presence of other vascular implants and grafts: Secondary | ICD-10-CM

## 2019-11-21 LAB — CBC WITH DIFFERENTIAL (CANCER CENTER ONLY)
Abs Immature Granulocytes: 0.01 10*3/uL (ref 0.00–0.07)
Basophils Absolute: 0 10*3/uL (ref 0.0–0.1)
Basophils Relative: 0 %
Eosinophils Absolute: 0.2 10*3/uL (ref 0.0–0.5)
Eosinophils Relative: 5 %
HCT: 22.7 % — ABNORMAL LOW (ref 36.0–46.0)
Hemoglobin: 7.8 g/dL — ABNORMAL LOW (ref 12.0–15.0)
Immature Granulocytes: 0 %
Lymphocytes Relative: 5 %
Lymphs Abs: 0.1 10*3/uL — ABNORMAL LOW (ref 0.7–4.0)
MCH: 30.8 pg (ref 26.0–34.0)
MCHC: 34.4 g/dL (ref 30.0–36.0)
MCV: 89.7 fL (ref 80.0–100.0)
Monocytes Absolute: 0.2 10*3/uL (ref 0.1–1.0)
Monocytes Relative: 7 %
Neutro Abs: 2.4 10*3/uL (ref 1.7–7.7)
Neutrophils Relative %: 83 %
Platelet Count: 19 10*3/uL — ABNORMAL LOW (ref 150–400)
RBC: 2.53 MIL/uL — ABNORMAL LOW (ref 3.87–5.11)
RDW: 17.6 % — ABNORMAL HIGH (ref 11.5–15.5)
WBC Count: 2.8 10*3/uL — ABNORMAL LOW (ref 4.0–10.5)
nRBC: 0 % (ref 0.0–0.2)

## 2019-11-21 MED ORDER — SODIUM CHLORIDE 0.9% FLUSH
10.0000 mL | INTRAVENOUS | Status: DC | PRN
  Administered 2019-11-21: 10 mL via INTRAVENOUS
  Filled 2019-11-21: qty 10

## 2019-11-21 MED ORDER — HEPARIN SOD (PORK) LOCK FLUSH 100 UNIT/ML IV SOLN
500.0000 [IU] | Freq: Once | INTRAVENOUS | Status: AC
  Administered 2019-11-21: 250 [IU] via INTRAVENOUS
  Filled 2019-11-21: qty 5

## 2019-11-21 NOTE — Telephone Encounter (Signed)
Confirmed 12/2 appointment with patient. Date/time per 11/30 schedule message.

## 2019-11-21 NOTE — Telephone Encounter (Signed)
Per Ned Card, NP, called with results of PELT 19 and Hgb 7.8. Pt stated, " I have extreme fatigue and SOB." Pt is okay with having blood transfused. Also reminded pt of lab/flush appt for 12/2. Pt verbalized understanding

## 2019-11-22 ENCOUNTER — Inpatient Hospital Stay: Payer: Medicare Other | Attending: Oncology

## 2019-11-22 ENCOUNTER — Inpatient Hospital Stay: Payer: Medicare Other

## 2019-11-22 ENCOUNTER — Other Ambulatory Visit: Payer: Self-pay | Admitting: Internal Medicine

## 2019-11-22 ENCOUNTER — Telehealth: Payer: Self-pay | Admitting: Oncology

## 2019-11-22 ENCOUNTER — Other Ambulatory Visit: Payer: Self-pay

## 2019-11-22 ENCOUNTER — Ambulatory Visit: Payer: Medicare Other

## 2019-11-22 ENCOUNTER — Other Ambulatory Visit: Payer: Self-pay | Admitting: *Deleted

## 2019-11-22 ENCOUNTER — Ambulatory Visit
Admission: RE | Admit: 2019-11-22 | Discharge: 2019-11-22 | Disposition: A | Discharge status: Home / Self Care | Payer: Medicare Other | Source: Ambulatory Visit | Attending: Radiation Oncology | Admitting: Radiation Oncology

## 2019-11-22 DIAGNOSIS — M549 Dorsalgia, unspecified: Secondary | ICD-10-CM | POA: Diagnosis not present

## 2019-11-22 DIAGNOSIS — F329 Major depressive disorder, single episode, unspecified: Secondary | ICD-10-CM | POA: Diagnosis not present

## 2019-11-22 DIAGNOSIS — I951 Orthostatic hypotension: Secondary | ICD-10-CM | POA: Diagnosis not present

## 2019-11-22 DIAGNOSIS — C210 Malignant neoplasm of anus, unspecified: Secondary | ICD-10-CM | POA: Insufficient documentation

## 2019-11-22 DIAGNOSIS — D696 Thrombocytopenia, unspecified: Secondary | ICD-10-CM

## 2019-11-22 DIAGNOSIS — D6959 Other secondary thrombocytopenia: Secondary | ICD-10-CM | POA: Diagnosis not present

## 2019-11-22 DIAGNOSIS — E039 Hypothyroidism, unspecified: Secondary | ICD-10-CM | POA: Insufficient documentation

## 2019-11-22 DIAGNOSIS — Z79899 Other long term (current) drug therapy: Secondary | ICD-10-CM | POA: Diagnosis not present

## 2019-11-22 DIAGNOSIS — D61818 Other pancytopenia: Secondary | ICD-10-CM | POA: Insufficient documentation

## 2019-11-22 DIAGNOSIS — Z510 Encounter for antineoplastic radiation therapy: Secondary | ICD-10-CM | POA: Insufficient documentation

## 2019-11-22 DIAGNOSIS — Z8542 Personal history of malignant neoplasm of other parts of uterus: Secondary | ICD-10-CM | POA: Diagnosis not present

## 2019-11-22 DIAGNOSIS — J449 Chronic obstructive pulmonary disease, unspecified: Secondary | ICD-10-CM | POA: Insufficient documentation

## 2019-11-22 DIAGNOSIS — G8929 Other chronic pain: Secondary | ICD-10-CM | POA: Diagnosis not present

## 2019-11-22 DIAGNOSIS — M797 Fibromyalgia: Secondary | ICD-10-CM | POA: Diagnosis not present

## 2019-11-22 LAB — CBC WITH DIFFERENTIAL (CANCER CENTER ONLY)
Abs Immature Granulocytes: 0.02 10*3/uL (ref 0.00–0.07)
Basophils Absolute: 0 10*3/uL (ref 0.0–0.1)
Basophils Relative: 1 %
Eosinophils Absolute: 0.2 10*3/uL (ref 0.0–0.5)
Eosinophils Relative: 9 %
HCT: 23.2 % — ABNORMAL LOW (ref 36.0–46.0)
Hemoglobin: 7.9 g/dL — ABNORMAL LOW (ref 12.0–15.0)
Immature Granulocytes: 1 %
Lymphocytes Relative: 8 %
Lymphs Abs: 0.2 10*3/uL — ABNORMAL LOW (ref 0.7–4.0)
MCH: 31 pg (ref 26.0–34.0)
MCHC: 34.1 g/dL (ref 30.0–36.0)
MCV: 91 fL (ref 80.0–100.0)
Monocytes Absolute: 0.2 10*3/uL (ref 0.1–1.0)
Monocytes Relative: 8 %
Neutro Abs: 1.6 10*3/uL — ABNORMAL LOW (ref 1.7–7.7)
Neutrophils Relative %: 73 %
Platelet Count: 22 10*3/uL — ABNORMAL LOW (ref 150–400)
RBC: 2.55 MIL/uL — ABNORMAL LOW (ref 3.87–5.11)
RDW: 18.2 % — ABNORMAL HIGH (ref 11.5–15.5)
WBC Count: 2.1 10*3/uL — ABNORMAL LOW (ref 4.0–10.5)
nRBC: 0 % (ref 0.0–0.2)

## 2019-11-22 LAB — PREPARE RBC (CROSSMATCH)

## 2019-11-22 MED ORDER — SODIUM CHLORIDE 0.9% FLUSH
10.0000 mL | INTRAVENOUS | Status: AC | PRN
  Administered 2019-11-22: 10 mL
  Filled 2019-11-22: qty 10

## 2019-11-22 MED ORDER — SODIUM CHLORIDE 0.9% IV SOLUTION
250.0000 mL | Freq: Once | INTRAVENOUS | Status: AC
  Administered 2019-11-22: 250 mL via INTRAVENOUS
  Filled 2019-11-22: qty 250

## 2019-11-22 MED ORDER — HEPARIN SOD (PORK) LOCK FLUSH 100 UNIT/ML IV SOLN
250.0000 [IU] | INTRAVENOUS | Status: DC | PRN
  Filled 2019-11-22: qty 5

## 2019-11-22 MED ORDER — HEPARIN SOD (PORK) LOCK FLUSH 100 UNIT/ML IV SOLN
500.0000 [IU] | Freq: Every day | INTRAVENOUS | Status: AC | PRN
  Administered 2019-11-22: 17:00:00 500 [IU]
  Filled 2019-11-22: qty 5

## 2019-11-22 NOTE — Telephone Encounter (Signed)
Added lab/port today per 12/1 schedule message. Confirmed with patient.

## 2019-11-22 NOTE — Progress Notes (Signed)
Pt. BP low complaining of dizziness and slight shortness of breath noted. Pt. states she has been constipated for 3 days. MD notified, states to continue with increasing oral intake, no extra IV fluids and proceed with unit of blood for today.

## 2019-11-22 NOTE — Progress Notes (Signed)
  Radiation Oncology         [phone] ________________________________  Name: Joanne Klein MRN: [id number]  Date: 09/28/2019  DOB: February 28, 1946  SIMULATION AND TREATMENT PLANNING NOTE   DIAGNOSIS:     ICD-10-CM   1. Anal cancer ([place])  C21.0      CONSENT VERIFIED: yes   SET UP: Patient is set-up supine   IMMOBILIZATION: The following immobilization is used: Customized VAC lock bag. This complex treatment device will be used on a daily basis during the patient's treatment.   Diagnosis: Anal cancer   NARRATIVE: The patient was brought to the [facility]. Identity was confirmed. All relevant records and images related to the planned course of therapy were reviewed. Then, the patient was positioned in a stable reproducible clinical set-up for radiation therapy using a customized vac lock bag. Skin markings were placed. The CT images were loaded into the planning software where the target and avoidance structures were contoured.The radiation prescription was entered and confirmed.   The patient will receive 54 Gy in 30 fractions to the high-dose target region.  Daily image guidance is ordered, and this will be used on a daily basis. This is necessary to ensure accurate and precise localization of the target in addition to accurate alignment of the normal tissue structures in this region. This is particularly important given the possible motion of the high-dose target.  Treatment planning then occurred.   I have requested : Intensity Modulated Radiotherapy (IMRT) is medically necessary for this case for the following reason: Dose homogeneity; the target is in close proximity to critical normal structures, including the femoral heads, bladder, and small bowel. IMRT is thus medically to appropriately treat the patient.   Special treatment procedure  The patient will receive chemotherapy during the course of radiation treatment. The patient may experience increased or  overlapping toxicity due to this combined-modality approach and the patient will be monitored for such problems. This may include extra lab  work as necessary. This therefore constitutes a special treatment procedure.     ________________________________  Jodelle Gross, MD, PhD

## 2019-11-22 NOTE — Patient Instructions (Signed)
Blood Transfusion, Adult, Care After This sheet gives you information about how to care for yourself after your procedure. Your doctor may also give you more specific instructions. If you have problems or questions, contact your doctor. Follow these instructions at home:   Take over-the-counter and prescription medicines only as told by your doctor.  Go back to your normal activities as told by your doctor.  Follow instructions from your doctor about how to take care of the area where an IV tube was put into your vein (insertion site). Make sure you: ? Wash your hands with soap and water before you change your bandage (dressing). If there is no soap and water, use hand sanitizer. ? Change your bandage as told by your doctor.  Check your IV insertion site every day for signs of infection. Check for: ? More redness, swelling, or pain. ? More fluid or blood. ? Warmth. ? Pus or a bad smell. Contact a doctor if:  You have more redness, swelling, or pain around the IV insertion site.  You have more fluid or blood coming from the IV insertion site.  Your IV insertion site feels warm to the touch.  You have pus or a bad smell coming from the IV insertion site.  Your pee (urine) turns pink, red, or brown.  You feel weak after doing your normal activities. Get help right away if:  You have signs of a serious allergic or body defense (immune) system reaction, including: ? Itchiness. ? Hives. ? Trouble breathing. ? Anxiety. ? Pain in your chest or lower back. ? Fever, flushing, and chills. ? Fast pulse. ? Rash. ? Watery poop (diarrhea). ? Throwing up (vomiting). ? Dark pee. ? Serious headache. ? Dizziness. ? Stiff neck. ? Yellow color in your face or the white parts of your eyes (jaundice). Summary  After a blood transfusion, return to your normal activities as told by your doctor.  Every day, check for signs of infection where the IV tube was put into your vein.  Some  signs of infection are warm skin, more redness and pain, more fluid or blood, and pus or a bad smell where the needle went in.  Contact your doctor if you feel weak or have any unusual symptoms. This information is not intended to replace advice given to you by your health care provider. Make sure you discuss any questions you have with your health care provider. Document Released: 12/29/2014 Document Revised: 04/14/2018 Document Reviewed: 08/01/2016 Elsevier Patient Education  2020 [employer]  Coronavirus (COVID-19) Are you at risk?  Are you at risk for the Coronavirus (COVID-19)?  To be considered HIGH RISK for Coronavirus (COVID-19), you have to meet the following criteria:  . Traveled to [place], [place], [place], [place] or [place]; or in the [place] to [place], [place], [place], or [place]; and have fever, cough, and shortness of breath within the last 2 weeks of travel OR . Been in close contact with a person diagnosed with COVID-19 within the last 2 weeks and have fever, cough, and shortness of breath . IF YOU DO NOT MEET THESE CRITERIA, YOU ARE CONSIDERED LOW RISK FOR COVID-19.  What to do if you are HIGH RISK for COVID-19?  . If you are having a medical emergency, call 911. . Seek medical care right away. Before you go to a doctor's office, urgent care or emergency department, call ahead and tell them about your recent travel, contact with someone diagnosed with COVID-19, and   your symptoms. You should receive instructions from your physician's office regarding next steps of care.  . When you arrive at healthcare provider, tell the healthcare staff immediately you have returned from visiting [place], [place], [place], [place] or [place]; or traveled in the [place] to [place], [place], [place], or [place]; in the last two weeks or you have been in close contact with a person diagnosed with COVID-19 in the last 2 weeks.   . Tell the health care staff about  your symptoms: fever, cough and shortness of breath. . After you have been seen by a medical provider, you will be either: o Tested for (COVID-19) and discharged home on quarantine except to seek medical care if symptoms worsen, and asked to  - Stay home and avoid contact with others until you get your results (4-5 days)  - Avoid travel on public transportation if possible (such as bus, train, or airplane) or o Sent to the Emergency Department by EMS for evaluation, COVID-19 testing, and possible admission depending on your condition and test results.  What to do if you are LOW RISK for COVID-19?  Reduce your risk of any infection by using the same precautions used for avoiding the common cold or flu:  . Wash your hands often with soap and warm water for at least 20 seconds.  If soap and water are not readily available, use an alcohol-based hand sanitizer with at least 60% alcohol.  . If coughing or sneezing, cover your mouth and nose by coughing or sneezing into the elbow areas of your shirt or coat, into a tissue or into your sleeve (not your hands). . Avoid shaking hands with others and consider head nods or verbal greetings only. . Avoid touching your eyes, nose, or mouth with unwashed hands.  . Avoid close contact with people who are sick. . Avoid places or events with large numbers of people in one location, like concerts or sporting events. . Carefully consider travel plans you have or are making. . If you are planning any travel outside or inside the [place], visit the CDC's Travelers' Health webpage for the latest health notices. . If you have some symptoms but not all symptoms, continue to monitor at home and seek medical attention if your symptoms worsen. . If you are having a medical emergency, call 911.   ADDITIONAL HEALTHCARE OPTIONS FOR PATIENTS  [facility] / e-Visit: [email or web address]         [facility]:  [phone]  [facility]: [phone]                   [facility]: [phone]   

## 2019-11-23 ENCOUNTER — Ambulatory Visit
Admission: RE | Admit: 2019-11-23 | Discharge: 2019-11-23 | Disposition: A | Discharge status: Home / Self Care | Payer: Medicare Other | Source: Ambulatory Visit | Attending: Radiation Oncology | Admitting: Radiation Oncology

## 2019-11-23 ENCOUNTER — Other Ambulatory Visit: Payer: Self-pay | Admitting: *Deleted

## 2019-11-23 ENCOUNTER — Other Ambulatory Visit: Payer: Self-pay

## 2019-11-23 ENCOUNTER — Inpatient Hospital Stay: Payer: Medicare Other

## 2019-11-23 ENCOUNTER — Telehealth: Payer: Self-pay | Admitting: Oncology

## 2019-11-23 ENCOUNTER — Telehealth: Payer: Self-pay | Admitting: *Deleted

## 2019-11-23 DIAGNOSIS — J449 Chronic obstructive pulmonary disease, unspecified: Secondary | ICD-10-CM | POA: Diagnosis not present

## 2019-11-23 DIAGNOSIS — I951 Orthostatic hypotension: Secondary | ICD-10-CM | POA: Diagnosis not present

## 2019-11-23 DIAGNOSIS — D61818 Other pancytopenia: Secondary | ICD-10-CM | POA: Diagnosis not present

## 2019-11-23 DIAGNOSIS — M797 Fibromyalgia: Secondary | ICD-10-CM | POA: Diagnosis not present

## 2019-11-23 DIAGNOSIS — D6959 Other secondary thrombocytopenia: Secondary | ICD-10-CM | POA: Diagnosis not present

## 2019-11-23 DIAGNOSIS — Z79899 Other long term (current) drug therapy: Secondary | ICD-10-CM | POA: Diagnosis not present

## 2019-11-23 DIAGNOSIS — E039 Hypothyroidism, unspecified: Secondary | ICD-10-CM | POA: Diagnosis not present

## 2019-11-23 DIAGNOSIS — C210 Malignant neoplasm of anus, unspecified: Secondary | ICD-10-CM | POA: Diagnosis not present

## 2019-11-23 DIAGNOSIS — G8929 Other chronic pain: Secondary | ICD-10-CM | POA: Diagnosis not present

## 2019-11-23 DIAGNOSIS — M549 Dorsalgia, unspecified: Secondary | ICD-10-CM | POA: Diagnosis not present

## 2019-11-23 DIAGNOSIS — Z510 Encounter for antineoplastic radiation therapy: Secondary | ICD-10-CM | POA: Diagnosis not present

## 2019-11-23 LAB — TYPE AND SCREEN
ABO/RH(D): A NEG
Antibody Screen: NEGATIVE
Unit division: 0

## 2019-11-23 LAB — SAMPLE TO BLOOD BANK

## 2019-11-23 LAB — BPAM RBC
Blood Product Expiration Date: 202012132359
ISSUE DATE / TIME: 202012011418
Unit Type and Rh: 600

## 2019-11-23 NOTE — Telephone Encounter (Signed)
Left VM that her last treatment is tomorrow, but the computer has it on Friday. Sent this concern to Dr. Ida Rogue nurse to discuss w/patient.

## 2019-11-23 NOTE — Telephone Encounter (Signed)
Added lab to 12/3 f/u. Date confirmed with desk nurse. Left message for patient.

## 2019-11-24 ENCOUNTER — Encounter: Payer: Self-pay | Admitting: Radiation Oncology

## 2019-11-24 ENCOUNTER — Inpatient Hospital Stay: Payer: Medicare Other

## 2019-11-24 ENCOUNTER — Other Ambulatory Visit: Payer: Self-pay

## 2019-11-24 ENCOUNTER — Ambulatory Visit: Payer: Medicare Other

## 2019-11-24 ENCOUNTER — Inpatient Hospital Stay: Payer: Medicare Other | Admitting: Nurse Practitioner

## 2019-11-24 ENCOUNTER — Ambulatory Visit
Admission: RE | Admit: 2019-11-24 | Discharge: 2019-11-24 | Disposition: A | Discharge status: Home / Self Care | Payer: Medicare Other | Source: Ambulatory Visit | Attending: Radiation Oncology | Admitting: Radiation Oncology

## 2019-11-24 ENCOUNTER — Encounter: Payer: Self-pay | Admitting: Nurse Practitioner

## 2019-11-24 ENCOUNTER — Other Ambulatory Visit: Payer: Self-pay | Admitting: *Deleted

## 2019-11-24 VITALS — BP 108/59 | HR 87 | Temp 98.7°F | Resp 18 | Ht 66.0 in | Wt 229.4 lb

## 2019-11-24 DIAGNOSIS — G8929 Other chronic pain: Secondary | ICD-10-CM | POA: Diagnosis not present

## 2019-11-24 DIAGNOSIS — C210 Malignant neoplasm of anus, unspecified: Secondary | ICD-10-CM

## 2019-11-24 DIAGNOSIS — Z79899 Other long term (current) drug therapy: Secondary | ICD-10-CM | POA: Diagnosis not present

## 2019-11-24 DIAGNOSIS — D696 Thrombocytopenia, unspecified: Secondary | ICD-10-CM

## 2019-11-24 DIAGNOSIS — I951 Orthostatic hypotension: Secondary | ICD-10-CM | POA: Diagnosis not present

## 2019-11-24 DIAGNOSIS — D6959 Other secondary thrombocytopenia: Secondary | ICD-10-CM | POA: Diagnosis not present

## 2019-11-24 DIAGNOSIS — Z510 Encounter for antineoplastic radiation therapy: Secondary | ICD-10-CM | POA: Diagnosis not present

## 2019-11-24 DIAGNOSIS — M549 Dorsalgia, unspecified: Secondary | ICD-10-CM | POA: Diagnosis not present

## 2019-11-24 DIAGNOSIS — M797 Fibromyalgia: Secondary | ICD-10-CM | POA: Diagnosis not present

## 2019-11-24 DIAGNOSIS — D61818 Other pancytopenia: Secondary | ICD-10-CM | POA: Diagnosis not present

## 2019-11-24 DIAGNOSIS — E039 Hypothyroidism, unspecified: Secondary | ICD-10-CM | POA: Diagnosis not present

## 2019-11-24 DIAGNOSIS — J449 Chronic obstructive pulmonary disease, unspecified: Secondary | ICD-10-CM | POA: Diagnosis not present

## 2019-11-24 LAB — CBC WITH DIFFERENTIAL (CANCER CENTER ONLY)
Abs Immature Granulocytes: 0.02 10*3/uL (ref 0.00–0.07)
Basophils Absolute: 0 10*3/uL (ref 0.0–0.1)
Basophils Relative: 1 %
Eosinophils Absolute: 0.2 10*3/uL (ref 0.0–0.5)
Eosinophils Relative: 9 %
HCT: 23.9 % — ABNORMAL LOW (ref 36.0–46.0)
Hemoglobin: 8.3 g/dL — ABNORMAL LOW (ref 12.0–15.0)
Immature Granulocytes: 1 %
Lymphocytes Relative: 11 %
Lymphs Abs: 0.2 10*3/uL — ABNORMAL LOW (ref 0.7–4.0)
MCH: 31 pg (ref 26.0–34.0)
MCHC: 34.7 g/dL (ref 30.0–36.0)
MCV: 89.2 fL (ref 80.0–100.0)
Monocytes Absolute: 0.3 10*3/uL (ref 0.1–1.0)
Monocytes Relative: 13 %
Neutro Abs: 1.3 10*3/uL — ABNORMAL LOW (ref 1.7–7.7)
Neutrophils Relative %: 65 %
Platelet Count: 22 10*3/uL — ABNORMAL LOW (ref 150–400)
RBC: 2.68 MIL/uL — ABNORMAL LOW (ref 3.87–5.11)
RDW: 17.8 % — ABNORMAL HIGH (ref 11.5–15.5)
WBC Count: 2 10*3/uL — ABNORMAL LOW (ref 4.0–10.5)
nRBC: 0 % (ref 0.0–0.2)

## 2019-11-24 LAB — PREPARE RBC (CROSSMATCH)

## 2019-11-24 MED ORDER — SODIUM CHLORIDE 0.9% IV SOLUTION
250.0000 mL | Freq: Once | INTRAVENOUS | Status: AC
  Administered 2019-11-24: 250 mL via INTRAVENOUS
  Filled 2019-11-24: qty 250

## 2019-11-24 MED ORDER — SODIUM CHLORIDE 0.9% FLUSH
10.0000 mL | INTRAVENOUS | Status: DC | PRN
  Filled 2019-11-24: qty 10

## 2019-11-24 NOTE — Progress Notes (Signed)
PICC line pulled per order.  Pt tolerated well.  Vaseline/pressure dressing applied.  Pt observed 30 minutes post.  VSS.  After care instructions given to pt and reviewed.  Pt verbalized understanding.

## 2019-11-24 NOTE — Patient Instructions (Signed)
Blood Transfusion, Adult  A blood transfusion is a procedure in which you receive donated blood, including plasma, platelets, and red blood cells, through an IV tube. You may need a blood transfusion because of illness, surgery, or injury. The blood may come from a donor. You may also be able to donate blood for yourself (autologous blood donation) before a surgery if you know that you might require a blood transfusion. The blood given in a transfusion is made up of different types of cells. You may receive:  Red blood cells. These carry oxygen to the cells in the body.  White blood cells. These help you fight infections.  Platelets. These help your blood to clot.  Plasma. This is the liquid part of your blood and it helps with fluid imbalances. If you have hemophilia or another clotting disorder, you may also receive other types of blood products. Tell a health care provider about:  Any allergies you have.  All medicines you are taking, including vitamins, herbs, eye drops, creams, and over-the-counter medicines.  Any problems you or family members have had with anesthetic medicines.  Any blood disorders you have.  Any surgeries you have had.  Any medical conditions you have, including any recent fever or cold symptoms.  Whether you are pregnant or may be pregnant.  Any previous reactions you have had during a blood transfusion. What are the risks? Generally, this is a safe procedure. However, problems may occur, including:  Having an allergic reaction to something in the donated blood. Hives and itching may be symptoms of this type of reaction.  Fever. This may be a reaction to the white blood cells in the transfused blood. Nausea or chest pain may accompany a fever.  Iron overload. This can happen from having many transfusions.  Transfusion-related acute lung injury (TRALI). This is a rare reaction that causes lung damage. The cause is not known.TRALI can occur within hours  of a transfusion or several days later.  Sudden (acute) or delayed hemolytic reactions. This happens if your blood does not match the cells in your transfusion. Your body's defense system (immune system) may try to attack the new cells. This complication is rare. The symptoms include fever, chills, nausea, and low back pain or chest pain.  Infection or disease transmission. This is rare. What happens before the procedure?  You will have a blood test to determine your blood type. This is necessary to know what kind of blood your body will accept and to match it to the donor blood.  If you are going to have a planned surgery, you may be able to do an autologous blood donation. This may be done in case you need to have a transfusion.  If you have had an allergic reaction to a transfusion in the past, you may be given medicine to help prevent a reaction. This medicine may be given to you by mouth or through an IV tube.  You will have your temperature, blood pressure, and pulse monitored before the transfusion.  Follow instructions from your health care provider about eating and drinking restrictions.  Ask your health care provider about: ? Changing or stopping your regular medicines. This is especially important if you are taking diabetes medicines or blood thinners. ? Taking medicines such as aspirin and ibuprofen. These medicines can thin your blood. Do not take these medicines before your procedure if your health care provider instructs you not to. What happens during the procedure?  An IV tube will be   inserted into one of your veins.  The bag of donated blood will be attached to your IV tube. The blood will then enter through your vein.  Your temperature, blood pressure, and pulse will be monitored regularly during the transfusion. This monitoring is done to detect early signs of a transfusion reaction.  If you have any signs or symptoms of a reaction, your transfusion will be stopped and  you may be given medicine.  When the transfusion is complete, your IV tube will be removed.  Pressure may be applied to the IV site for a few minutes.  A bandage (dressing) will be applied. The procedure may vary among health care providers and hospitals. What happens after the procedure?  Your temperature, blood pressure, heart rate, breathing rate, and blood oxygen level will be monitored often.  Your blood may be tested to see how you are responding to the transfusion.  You may be warmed with fluids or blankets to maintain a normal body temperature. Summary  A blood transfusion is a procedure in which you receive donated blood, including plasma, platelets, and red blood cells, through an IV tube.  Your temperature, blood pressure, and pulse will be monitored before, during, and after the transfusion.  Your blood may be tested after the transfusion to see how your body has responded. This information is not intended to replace advice given to you by your health care provider. Make sure you discuss any questions you have with your health care provider. Document Released: 12/05/2000 Document Revised: 10/25/2018 Document Reviewed: 09/04/2016 Elsevier Patient Education  2020 [place] Removal, Adult, Care After This sheet gives you information about how to care for yourself after your procedure. Your health care provider may also give you more specific instructions. If you have problems or questions, contact your health care provider. What can I expect after the procedure? After your procedure, it is common to have:  Tenderness or soreness.  Redness, swelling, or a scab where the PICC was removed (exit site). Follow these instructions at home: For the first 24 hours after the procedure   Keep the bandage (dressing) on the exit site clean and dry. Do not remove the dressing until your health care provider tells you to do so.  Check your arm often for signs and  symptoms of an infection. Check for: ? A red streak that spreads away from the dressing. ? Blood or fluid that you can see on the dressing. ? More redness or swelling.  Do not lift anything heavy or do activities that require great effort until your health care provider says it is okay. You should avoid: ? Lifting weights. ? Yard work. ? Any physical activity with repetitive arm movement.  Watch closely for any signs of an air bubble in the vein (air embolism). This is a rare but serious complication. If you have signs of air embolism, call 911 immediately and lie down on your left side to keep the air from moving into the lungs. Signs of an air embolism include: ? Difficulty breathing. ? Chest pain. ? Coughing or wheezing. ? Skin that is pale, blue, cold, or clammy. ? Rapid pulse. ? Rapid breathing. ? Fainting. After 24 Hours have passed:  Remove your dressing as told by your health care provider. Make sure you wash your hands with soap and water before and after you change the dressing. If soap and water are not available, use hand sanitizer.  Return to your normal activities as told  by your health care provider.  A small scab may develop over the exit site. Do not pick at the scab.  When bathing or showering, gently wash the exit site with soap and water. Pat it dry.  Watch for signs of infection, such as: ? Fever or chills. ? Swollen glands under the arm. ? More redness, swelling, or soreness in the arm. ? Blood, fluid, or pus coming from the exit site. ? Warmth or a bad smell at the exit site. ? A red streak spreading away from the exit site. General instructions  Take over-the-counter and prescription medicines only as told by your health care provider. Do not take any new medicines without checking with your health care provider first.  If you were prescribed an antibiotic medicine, apply or take it as told by your health care provider. Do not stop using the antibiotic  even if your condition improves.  Keep all follow-up visits as told by your health care provider. This is important. Contact a health care provider if:  You have a fever or chills.  You have soreness, redness, or swelling on your exit site, and it gets worse.  You have swollen glands under your arm.  You have any of the following symptoms at your exit site: ? Blood, fluid, or pus. ? Unusual warmth. ? A bad smell. ? A red streak spreading away from the exit site. Get help right away if:  You have numbness or tingling in your fingers, hand, or arm.  Your arm looks blue and feels cold.  You have signs of an air embolism, such as: ? Difficulty breathing. ? Chest pain. ? Coughing or wheezing. ? Skin that is pale, blue, cold, or clammy. ? Rapid pulse. ? Rapid breathing. ? Fainting. These symptoms may represent a serious problem that is an emergency. Do not wait to see if the symptoms will go away. Get medical help right away. Call your local emergency services (911 in the [place] Do not drive yourself to the hospital. Summary  After your procedure, it is common to have tenderness or soreness, redness, swelling, or a scab at the exit site.  Keep the dressing over the exit site clean and dry. Do not remove the dressing until your health care provider tells you to do so.  Do not lift anything heavy or do activities that require great effort until your health care provider says it is okay.  Watch closely for any signs of an air embolism. If you have signs of air embolism, call 911 immediately and lie down on your left side. This information is not intended to replace advice given to you by your health care provider. Make sure you discuss any questions you have with your health care provider. Document Released: 12/13/2013 Document Revised: 11/20/2017 Document Reviewed: 02/03/2017 Elsevier Patient Education  2020 [employer].

## 2019-11-24 NOTE — Progress Notes (Addendum)
Joanne Klein   Diagnosis:  Anal cancer  INTERVAL HISTORY:   Joanne Klein returns as scheduled.  Joanne Klein continues radiation.  Joanne Klein completed cycle 2 chemotherapy with dose reduced 5-fluorouracil beginning 11/06/2020.  Joanne Klein was transfused a unit of blood on 11/22/2019 for symptomatic anemia with hemoglobin 7.9.  Joanne Klein felt better following the blood transfusion.  Joanne Klein denies fever and chills.  Joanne Klein has mild bleeding when Joanne Klein strains for bowel movement.  Joanne Klein notes spontaneous bruises on the legs.  No mouth sores.  No nausea or vomiting.  No hand or foot pain or redness.  Some diarrhea.  Joanne Klein has a good appetite.  Objective:  Vital signs in last 24 hours:  Blood pressure (!) 108/59, pulse 87, temperature 98.7 F (37.1 C), temperature source Temporal, resp. rate 18, height 5\' 6"  (QA348G m), weight 229 lb 6.4 oz (104.1 kg), SpO2 100 %.    HEENT: No thrush or ulcers. GI: Abdomen soft and nontender.  No hepatomegaly.  Hyperpigmentation, superficial ulceration perineum/perianal region. Vascular: No leg edema. Neuro: Alert and oriented. Skin: Ecchymoses scattered over the legs.  Some resolving.   Lab Results:  Lab Results  Component Value Date   WBC 2.0 (L) 11/24/2019   HGB 8.3 (L) 11/24/2019   HCT 23.9 (L) 11/24/2019   MCV 89.2 11/24/2019   PLT 22 (L) 11/24/2019   NEUTROABS 1.3 (L) 11/24/2019    Imaging:  No results found.  Medications: I have reviewed the patient's current medications.  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 Biopsy 08/30/2019 confirmed well-differentiated squamous cell carcinoma with positive P 16 and p63 stains ? 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 Began concurrent chemoRT with 5FU/mitomycin on 10/03/2019 ? Cycle 2 5-FU, dose reduced, mitomycin held 11/07/2019 2. COPD 3. Fibromyalgia 4. Chronic back pain 5. Hypothyroid 6. Depression 7. History of uterine cancer-age 3 8. Pain secondary to #1 9. Orthostatic hypotension, fatigue, dyspnea on exertion, early mucositis requiring supportive care on day 9, secondary to chemotherapy  10. Thrombocytopenia PLT 58K and neutropenia ANC 1.4 on day 9, secondary to chemotherapy 11. Worsening cytopenias, PLT 8K and ANC 0.0 on day 15, secondary to chemotherapy. Started prophylaxis with cipro on 10/22 day 11 12. Hospital admission 10/17/2019 -fatigue, pancytopenia, hypokalemia  Disposition: [name] appears stable.  Joanne Klein completed cycle 2 chemotherapy with single agent dose reduced 5-fluorouracil on 11/07/2019.  Joanne Klein again developed significant pancytopenia.  Joanne Klein received red cell transfusion support earlier this week.  Joanne Klein would like an additional unit of blood today.  Joanne Klein will return for a follow-up CBC on 11/28/2019.  Joanne Klein would like to have the PICC line removed after the blood transfusion today, order entered.  Lab on 11/28/2019, lab and follow-up on 12/13/2019.  Joanne Klein will contact the office in the interim with any problems.  We specifically discussed fever, chills, other signs of infection, bleeding.  Patient seen with Dr. Benay Spice.    Ned Card ANP/GNP-BC   11/24/2019  2:09 PM  This was a shared visit with Ned Card.  [name] was interviewed and examined.  Joanne Klein tolerated the second cycle of 5-fluorouracil with less toxicity.  Joanne Klein has been persistent pancytopenia.  Joanne Klein will receive an additional unit of red blood cells today.  Joanne Klein will call for bleeding or increased bruising.  Joanne Klein will return for a CBC on 11/28/2019.  Julieanne Manson, MD

## 2019-11-25 ENCOUNTER — Ambulatory Visit: Payer: Medicare Other

## 2019-11-25 ENCOUNTER — Telehealth: Payer: Self-pay | Admitting: Oncology

## 2019-11-25 LAB — BPAM RBC
Blood Product Expiration Date: 202012262359
ISSUE DATE / TIME: 202012031519
Unit Type and Rh: 600

## 2019-11-25 LAB — TYPE AND SCREEN
ABO/RH(D): A NEG
Antibody Screen: NEGATIVE
Unit division: 0

## 2019-11-25 NOTE — Telephone Encounter (Signed)
Scheduled per los. Called and spoke with patient. Confirmed appt 

## 2019-11-26 DIAGNOSIS — J449 Chronic obstructive pulmonary disease, unspecified: Secondary | ICD-10-CM | POA: Diagnosis not present

## 2019-11-28 ENCOUNTER — Other Ambulatory Visit: Payer: Self-pay

## 2019-11-28 ENCOUNTER — Inpatient Hospital Stay: Payer: Medicare Other

## 2019-11-28 DIAGNOSIS — G8929 Other chronic pain: Secondary | ICD-10-CM | POA: Diagnosis not present

## 2019-11-28 DIAGNOSIS — D6959 Other secondary thrombocytopenia: Secondary | ICD-10-CM | POA: Diagnosis not present

## 2019-11-28 DIAGNOSIS — C210 Malignant neoplasm of anus, unspecified: Secondary | ICD-10-CM

## 2019-11-28 DIAGNOSIS — J449 Chronic obstructive pulmonary disease, unspecified: Secondary | ICD-10-CM | POA: Diagnosis not present

## 2019-11-28 DIAGNOSIS — D61818 Other pancytopenia: Secondary | ICD-10-CM | POA: Diagnosis not present

## 2019-11-28 DIAGNOSIS — M797 Fibromyalgia: Secondary | ICD-10-CM | POA: Diagnosis not present

## 2019-11-28 DIAGNOSIS — I951 Orthostatic hypotension: Secondary | ICD-10-CM | POA: Diagnosis not present

## 2019-11-28 DIAGNOSIS — Z79899 Other long term (current) drug therapy: Secondary | ICD-10-CM | POA: Diagnosis not present

## 2019-11-28 DIAGNOSIS — E039 Hypothyroidism, unspecified: Secondary | ICD-10-CM | POA: Diagnosis not present

## 2019-11-28 DIAGNOSIS — M549 Dorsalgia, unspecified: Secondary | ICD-10-CM | POA: Diagnosis not present

## 2019-11-28 LAB — CBC WITH DIFFERENTIAL (CANCER CENTER ONLY)
Abs Immature Granulocytes: 0.01 10*3/uL (ref 0.00–0.07)
Basophils Absolute: 0 10*3/uL (ref 0.0–0.1)
Basophils Relative: 1 %
Eosinophils Absolute: 0.1 10*3/uL (ref 0.0–0.5)
Eosinophils Relative: 7 %
HCT: 30.1 % — ABNORMAL LOW (ref 36.0–46.0)
Hemoglobin: 10.3 g/dL — ABNORMAL LOW (ref 12.0–15.0)
Immature Granulocytes: 1 %
Lymphocytes Relative: 12 %
Lymphs Abs: 0.2 10*3/uL — ABNORMAL LOW (ref 0.7–4.0)
MCH: 31 pg (ref 26.0–34.0)
MCHC: 34.2 g/dL (ref 30.0–36.0)
MCV: 90.7 fL (ref 80.0–100.0)
Monocytes Absolute: 0.2 10*3/uL (ref 0.1–1.0)
Monocytes Relative: 13 %
Neutro Abs: 1.1 10*3/uL — ABNORMAL LOW (ref 1.7–7.7)
Neutrophils Relative %: 66 %
Platelet Count: 28 10*3/uL — ABNORMAL LOW (ref 150–400)
RBC: 3.32 MIL/uL — ABNORMAL LOW (ref 3.87–5.11)
RDW: 18 % — ABNORMAL HIGH (ref 11.5–15.5)
WBC Count: 1.7 10*3/uL — ABNORMAL LOW (ref 4.0–10.5)
nRBC: 0 % (ref 0.0–0.2)

## 2019-11-29 ENCOUNTER — Telehealth: Payer: Self-pay | Admitting: *Deleted

## 2019-11-29 ENCOUNTER — Other Ambulatory Visit: Payer: Self-pay | Admitting: Nurse Practitioner

## 2019-11-29 DIAGNOSIS — C210 Malignant neoplasm of anus, unspecified: Secondary | ICD-10-CM

## 2019-11-29 NOTE — Telephone Encounter (Signed)
-----   Message from Owens Shark, NP sent at 11/29/2019  1:42 PM EST ----- Please let her know white count and platelets remain low.  Call with fever, chills, signs of infection, bleeding.  Repeat CBC 12/05/2019.  Thanks, I entered the order for the CBC and sent a schedule message for the appointment next week.

## 2019-11-29 NOTE — Telephone Encounter (Signed)
Per Ned Card, NP, called and made pt aware, WBC and PLT remains low. Call with fever, chills, signs of infections, and bleeding. Also appt was put in to repeat CBC on 12/14. No time available but pt is aware. Pt verbalized understanding.

## 2019-11-30 ENCOUNTER — Telehealth: Payer: Self-pay | Admitting: Oncology

## 2019-11-30 NOTE — Telephone Encounter (Signed)
Scheduled appt per 12/8 sch message - pt aware of appt date and time   

## 2019-12-04 DIAGNOSIS — R197 Diarrhea, unspecified: Secondary | ICD-10-CM | POA: Diagnosis not present

## 2019-12-04 DIAGNOSIS — Z743 Need for continuous supervision: Secondary | ICD-10-CM | POA: Diagnosis not present

## 2019-12-04 DIAGNOSIS — E860 Dehydration: Secondary | ICD-10-CM | POA: Diagnosis not present

## 2019-12-04 DIAGNOSIS — K5900 Constipation, unspecified: Secondary | ICD-10-CM | POA: Diagnosis not present

## 2019-12-05 ENCOUNTER — Other Ambulatory Visit: Payer: Self-pay

## 2019-12-05 ENCOUNTER — Inpatient Hospital Stay: Payer: Medicare Other | Admitting: General Practice

## 2019-12-05 DIAGNOSIS — C210 Malignant neoplasm of anus, unspecified: Secondary | ICD-10-CM | POA: Diagnosis not present

## 2019-12-05 DIAGNOSIS — I951 Orthostatic hypotension: Secondary | ICD-10-CM | POA: Diagnosis not present

## 2019-12-05 DIAGNOSIS — E039 Hypothyroidism, unspecified: Secondary | ICD-10-CM | POA: Diagnosis not present

## 2019-12-05 DIAGNOSIS — M549 Dorsalgia, unspecified: Secondary | ICD-10-CM | POA: Diagnosis not present

## 2019-12-05 DIAGNOSIS — M797 Fibromyalgia: Secondary | ICD-10-CM | POA: Diagnosis not present

## 2019-12-05 DIAGNOSIS — D6959 Other secondary thrombocytopenia: Secondary | ICD-10-CM | POA: Diagnosis not present

## 2019-12-05 DIAGNOSIS — J449 Chronic obstructive pulmonary disease, unspecified: Secondary | ICD-10-CM | POA: Diagnosis not present

## 2019-12-05 DIAGNOSIS — G8929 Other chronic pain: Secondary | ICD-10-CM | POA: Diagnosis not present

## 2019-12-05 DIAGNOSIS — D61818 Other pancytopenia: Secondary | ICD-10-CM | POA: Diagnosis not present

## 2019-12-05 DIAGNOSIS — Z79899 Other long term (current) drug therapy: Secondary | ICD-10-CM | POA: Diagnosis not present

## 2019-12-05 LAB — CBC WITH DIFFERENTIAL (CANCER CENTER ONLY)
Abs Immature Granulocytes: 0.02 10*3/uL (ref 0.00–0.07)
Basophils Absolute: 0 10*3/uL (ref 0.0–0.1)
Basophils Relative: 1 %
Eosinophils Absolute: 0.1 10*3/uL (ref 0.0–0.5)
Eosinophils Relative: 3 %
HCT: 26.3 % — ABNORMAL LOW (ref 36.0–46.0)
Hemoglobin: 9 g/dL — ABNORMAL LOW (ref 12.0–15.0)
Immature Granulocytes: 1 %
Lymphocytes Relative: 19 %
Lymphs Abs: 0.4 10*3/uL — ABNORMAL LOW (ref 0.7–4.0)
MCH: 31.5 pg (ref 26.0–34.0)
MCHC: 34.2 g/dL (ref 30.0–36.0)
MCV: 92 fL (ref 80.0–100.0)
Monocytes Absolute: 0.4 10*3/uL (ref 0.1–1.0)
Monocytes Relative: 20 %
Neutro Abs: 1.1 10*3/uL — ABNORMAL LOW (ref 1.7–7.7)
Neutrophils Relative %: 56 %
Platelet Count: 41 10*3/uL — ABNORMAL LOW (ref 150–400)
RBC: 2.86 MIL/uL — ABNORMAL LOW (ref 3.87–5.11)
RDW: 19.7 % — ABNORMAL HIGH (ref 11.5–15.5)
WBC Count: 1.9 10*3/uL — ABNORMAL LOW (ref 4.0–10.5)
nRBC: 0 % (ref 0.0–0.2)

## 2019-12-07 ENCOUNTER — Telehealth: Payer: Self-pay | Admitting: *Deleted

## 2019-12-07 NOTE — Progress Notes (Signed)
[facility] CSW Progress Notes  Request received from Merceda Elks RN to speak w patient about ongoing issues with depression.  CSW called patient, unable to reach, left VM w my call back number.  Edwyna Shell, LCSW Clinical Social Worker Phone:  [phone] Cell:  [phone]

## 2019-12-07 NOTE — Telephone Encounter (Signed)
Asking for CBC results from Joanne Klein were reviewed with her. Asking how long to expect her diarrhea to last? Informed her it can take weeks after her last treatment to go away. She is taking #4 Imodium/day--one after each loose stool. Informed her she can take 8/day. Suggested she start the day w/#2 to try to head off the diarrhea. Also reports feelings of depression and that it has gone into "complete dread" and is "off the chart". Also having panic attacks. She no longer has a therapist--is on Zoloft. Suggested she call her PCP about the depression and panic attacks. Directly asked if she was suicidal or had a plan and she stated "no". She agrees to speak with our CSW for assessment and counseling. She requests a call late afternoon, saying she spends most of day sleeping in the bed. Encouraged her that she IS going to get better and have her life back--it takes time and she had a very difficult treatment to endure. She needs to focus on every small improvement. Last issue is that her left hand ring finger and little finger has been numb for a couple weeks. Per Dr. Particia Lather of cause, but not related to RT/chemo. Suggested she discuss this with her PCP as well. She agrees to do so.

## 2019-12-08 ENCOUNTER — Telehealth: Payer: Self-pay | Admitting: General Practice

## 2019-12-08 NOTE — Telephone Encounter (Signed)
[facility] CSW Progress Notes  Second call to patient to assess for needs/resources.  No answer, left generic VM as VM was not personally identified.  Edwyna Shell, LCSW Clinical Social Worker Phone:  [phone] Cell:  [phone]

## 2019-12-12 ENCOUNTER — Telehealth: Payer: Self-pay

## 2019-12-12 NOTE — Telephone Encounter (Signed)
Called patient to schedule appointment. Left voicemail to call back to schedule.  Copied from [place] [phone]. Topic: General - Other >> Dec 12, 2019 10:56 AM Greggory Keen D wrote: Reason for CRM: pt called saying her ring finger and little finger on her left hand has been going numb for about 2 weeks.  Please advise  [phone]

## 2019-12-13 ENCOUNTER — Encounter: Payer: Self-pay | Admitting: General Practice

## 2019-12-13 ENCOUNTER — Inpatient Hospital Stay: Payer: Medicare Other

## 2019-12-13 ENCOUNTER — Inpatient Hospital Stay (HOSPITAL_BASED_OUTPATIENT_CLINIC_OR_DEPARTMENT_OTHER): Payer: Medicare Other | Admitting: Oncology

## 2019-12-13 ENCOUNTER — Other Ambulatory Visit: Payer: Self-pay | Admitting: *Deleted

## 2019-12-13 ENCOUNTER — Other Ambulatory Visit: Payer: Self-pay

## 2019-12-13 ENCOUNTER — Other Ambulatory Visit: Payer: Self-pay | Admitting: Oncology

## 2019-12-13 VITALS — BP 98/56 | HR 106 | Temp 98.7°F | Resp 19 | Ht 66.0 in | Wt 221.9 lb

## 2019-12-13 VITALS — BP 125/71 | HR 77

## 2019-12-13 DIAGNOSIS — C210 Malignant neoplasm of anus, unspecified: Secondary | ICD-10-CM

## 2019-12-13 DIAGNOSIS — D6959 Other secondary thrombocytopenia: Secondary | ICD-10-CM | POA: Diagnosis not present

## 2019-12-13 DIAGNOSIS — E039 Hypothyroidism, unspecified: Secondary | ICD-10-CM | POA: Diagnosis not present

## 2019-12-13 DIAGNOSIS — D61818 Other pancytopenia: Secondary | ICD-10-CM | POA: Diagnosis not present

## 2019-12-13 DIAGNOSIS — M797 Fibromyalgia: Secondary | ICD-10-CM | POA: Diagnosis not present

## 2019-12-13 DIAGNOSIS — E876 Hypokalemia: Secondary | ICD-10-CM

## 2019-12-13 DIAGNOSIS — M549 Dorsalgia, unspecified: Secondary | ICD-10-CM | POA: Diagnosis not present

## 2019-12-13 DIAGNOSIS — Z79899 Other long term (current) drug therapy: Secondary | ICD-10-CM | POA: Diagnosis not present

## 2019-12-13 DIAGNOSIS — I951 Orthostatic hypotension: Secondary | ICD-10-CM

## 2019-12-13 DIAGNOSIS — G8929 Other chronic pain: Secondary | ICD-10-CM | POA: Diagnosis not present

## 2019-12-13 DIAGNOSIS — J449 Chronic obstructive pulmonary disease, unspecified: Secondary | ICD-10-CM | POA: Diagnosis not present

## 2019-12-13 LAB — CBC WITH DIFFERENTIAL (CANCER CENTER ONLY)
Abs Immature Granulocytes: 0.02 10*3/uL (ref 0.00–0.07)
Basophils Absolute: 0 10*3/uL (ref 0.0–0.1)
Basophils Relative: 0 %
Eosinophils Absolute: 0.1 10*3/uL (ref 0.0–0.5)
Eosinophils Relative: 2 %
HCT: 26.2 % — ABNORMAL LOW (ref 36.0–46.0)
Hemoglobin: 9 g/dL — ABNORMAL LOW (ref 12.0–15.0)
Immature Granulocytes: 1 %
Lymphocytes Relative: 16 %
Lymphs Abs: 0.4 10*3/uL — ABNORMAL LOW (ref 0.7–4.0)
MCH: 32.1 pg (ref 26.0–34.0)
MCHC: 34.4 g/dL (ref 30.0–36.0)
MCV: 93.6 fL (ref 80.0–100.0)
Monocytes Absolute: 0.5 10*3/uL (ref 0.1–1.0)
Monocytes Relative: 20 %
Neutro Abs: 1.5 10*3/uL — ABNORMAL LOW (ref 1.7–7.7)
Neutrophils Relative %: 61 %
Platelet Count: 54 10*3/uL — ABNORMAL LOW (ref 150–400)
RBC: 2.8 MIL/uL — ABNORMAL LOW (ref 3.87–5.11)
RDW: 20.8 % — ABNORMAL HIGH (ref 11.5–15.5)
WBC Count: 2.5 10*3/uL — ABNORMAL LOW (ref 4.0–10.5)
nRBC: 0 % (ref 0.0–0.2)

## 2019-12-13 LAB — CMP (CANCER CENTER ONLY)
ALT: 9 U/L (ref 0–44)
AST: 16 U/L (ref 15–41)
Albumin: 3.4 g/dL — ABNORMAL LOW (ref 3.5–5.0)
Alkaline Phosphatase: 75 U/L (ref 38–126)
Anion gap: 8 (ref 5–15)
BUN: 19 mg/dL (ref 8–23)
CO2: 32 mmol/L (ref 22–32)
Calcium: 8.8 mg/dL — ABNORMAL LOW (ref 8.9–10.3)
Chloride: 103 mmol/L (ref 98–111)
Creatinine: 0.84 mg/dL (ref 0.44–1.00)
GFR, Est AFR Am: >60 mL/min (ref >60)
GFR, Estimated: >60 mL/min (ref >60)
Glucose, Bld: 132 mg/dL — ABNORMAL HIGH (ref 70–99)
Potassium: 3.1 mmol/L — ABNORMAL LOW (ref 3.5–5.1)
Sodium: 143 mmol/L (ref 135–145)
Total Bilirubin: 1 mg/dL (ref 0.3–1.2)
Total Protein: 6.7 g/dL (ref 6.5–8.1)

## 2019-12-13 LAB — MAGNESIUM: Magnesium: 1.6 mg/dL — ABNORMAL LOW (ref 1.7–2.4)

## 2019-12-13 MED ORDER — MAGNESIUM SULFATE 2 GM/50ML IV SOLN
2.0000 g | Freq: Once | INTRAVENOUS | Status: AC
  Administered 2019-12-13: 2 g via INTRAVENOUS

## 2019-12-13 MED ORDER — POTASSIUM CHLORIDE CRYS ER 20 MEQ PO TBCR
20.0000 meq | EXTENDED_RELEASE_TABLET | Freq: Once | ORAL | Status: AC
  Administered 2019-12-13: 20 meq via ORAL

## 2019-12-13 MED ORDER — SODIUM CHLORIDE 0.9 % IV SOLN
INTRAVENOUS | Status: AC
  Filled 2019-12-13 (×2): qty 250

## 2019-12-13 MED ORDER — POTASSIUM CHLORIDE CRYS ER 20 MEQ PO TBCR
EXTENDED_RELEASE_TABLET | ORAL | Status: AC
  Filled 2019-12-13: qty 1

## 2019-12-13 MED ORDER — SODIUM CHLORIDE 0.9 % IV SOLN
INTRAVENOUS | Status: DC
  Filled 2019-12-13: qty 250

## 2019-12-13 MED ORDER — POTASSIUM CHLORIDE CRYS ER 20 MEQ PO TBCR: 20.0000 meq | EXTENDED_RELEASE_TABLET | Freq: Every day | ORAL | 1 refills | Status: DC

## 2019-12-13 MED ORDER — MAGNESIUM SULFATE 2 GM/50ML IV SOLN
INTRAVENOUS | Status: AC
  Filled 2019-12-13: qty 50

## 2019-12-13 NOTE — Patient Instructions (Addendum)
For low potassium level, start potassium chloride 20 meq by mouth daily. Will recheck labs in 2 weeks-scheduler will call you   Rehydration, Adult Rehydration is the replacement of body fluids and salts and minerals (electrolytes) that are lost during dehydration. Dehydration is when there is not enough fluid or water in the body. This happens when you lose more fluids than you take in. Common causes of dehydration include:  Vomiting.  Diarrhea.  Excessive sweating, such as from heat exposure or exercise.  Taking medicines that cause the body to lose excess fluid (diuretics).  Impaired kidney function.  Not drinking enough fluid.  Certain illnesses or infections.  Certain poorly controlled long-term (chronic) illnesses, such as diabetes, heart disease, and kidney disease.  Symptoms of mild dehydration may include thirst, dry lips and mouth, dry skin, and dizziness. Symptoms of severe dehydration may include increased heart rate, confusion, fainting, and not urinating. You can rehydrate by drinking certain fluids or getting fluids through an IV tube, as told by your health care provider. What are the risks? Generally, rehydration is safe. However, one problem that can happen is taking in too much fluid (overhydration). This is rare. If overhydration happens, it can cause an electrolyte imbalance, kidney failure, or a decrease in salt (sodium) levels in the body. How to rehydrate Follow instructions from your health care provider for rehydration. The kind of fluid you should drink and the amount you should drink depend on your condition.  If directed by your health care provider, drink an oral rehydration solution (ORS). This is a drink designed to treat dehydration that is found in pharmacies and retail stores. ? Make an ORS by following instructions on the package. ? Start by drinking small amounts, about  cup (120 mL) every 5-10 minutes. ? Slowly increase how much you drink until  you have taken the amount recommended by your health care provider.  Drink enough clear fluids to keep your urine clear or pale yellow. If you were instructed to drink an ORS, finish the ORS first, then start slowly drinking other clear fluids. Drink fluids such as: ? Water. Do not drink only water. Doing that can lead to having too little sodium in your body (hyponatremia). ? Ice chips. ? Fruit juice that you have added water to (diluted juice). ? Low-calorie sports drinks.  If you are severely dehydrated, your health care provider may recommend that you receive fluids through an IV tube in the hospital.  Do not take sodium tablets. Doing that can lead to the condition of having too much sodium in your body (hypernatremia). Eating while you rehydrate Follow instructions from your health care provider about what to eat while you rehydrate. Your health care provider may recommend that you slowly begin eating regular foods in small amounts.  Eat foods that contain a healthy balance of electrolytes, such as bananas, oranges, potatoes, tomatoes, and spinach.  Avoid foods that are greasy or contain a lot of fat or sugar.  In some cases, you may get nutrition through a feeding tube that is passed through your nose and into your stomach (nasogastric tube, or NG tube). This may be done if you have uncontrolled vomiting or diarrhea. Beverages to avoid Certain beverages may make dehydration worse. While you rehydrate, avoid:  Alcohol.  Caffeine.  Drinks that contain a lot of sugar. These include: ? High-calorie sports drinks. ? Fruit juice that is not diluted. ? Soda.  Check nutrition labels to see how much sugar or caffeine a  beverage contains. Signs of dehydration recovery You may be recovering from dehydration if:  You are urinating more often than before you started rehydrating.  Your urine is clear or pale yellow.  Your energy level improves.  You vomit less frequently.  You  have diarrhea less frequently.  Your appetite improves or returns to normal.  You feel less dizzy or less light-headed.  Your skin tone and color start to look more normal. Contact a health care provider if:  You continue to have symptoms of mild dehydration, such as: ? Thirst. ? Dry lips. ? Slightly dry mouth. ? Dry, warm skin. ? Dizziness.  You continue to vomit or have diarrhea. Get help right away if:  You have symptoms of dehydration that get worse.  You feel: ? Confused. ? Weak. ? Like you are going to faint.  You have not urinated in 6-8 hours.  You have very dark urine.  You have trouble breathing.  Your heart rate while sitting still is over 100 beats a minute.  You cannot drink fluids without vomiting.  You have vomiting or diarrhea that: ? Gets worse. ? Does not go away.  You have a fever. This information is not intended to replace advice given to you by your health care provider. Make sure you discuss any questions you have with your health care provider. Document Released: 03/01/2012 Document Revised: 11/20/2017 Document Reviewed: 02/01/2016 Elsevier Patient Education  2020 [employer].    Hypomagnesemia Hypomagnesemia is a condition in which the level of magnesium in the blood is low. Magnesium is a mineral that is found in many foods. It is used in many different processes in the body. Hypomagnesemia can affect every organ in the body. In severe cases, it can cause life-threatening problems. What are the causes? This condition may be caused by: Not getting enough magnesium in your diet. Malnutrition. Problems with absorbing magnesium from the intestines. Dehydration. Alcohol abuse. Vomiting. Severe or chronic diarrhea. Some medicines, including medicines that make you urinate more (diuretics). Certain diseases, such as kidney disease, diabetes, celiac disease, and overactive thyroid. What are the signs or symptoms? Symptoms of this  condition include: Loss of appetite. Nausea and vomiting. Involuntary shaking or trembling of a body part (tremor). Muscle weakness. Tingling in the arms and legs. Sudden tightening of muscles (muscle spasms). Confusion. Psychiatric issues, such as depression, irritability, or psychosis. A feeling of fluttering of the heart. Seizures. These symptoms are more severe if magnesium levels drop suddenly. How is this diagnosed? This condition may be diagnosed based on: Your symptoms and medical history. A physical exam. Blood and urine tests. How is this treated? Treatment depends on the cause and the severity of the condition. It may be treated with: A magnesium supplement. This can be taken in pill form. If the condition is severe, magnesium is usually given through an IV. Changes to your diet. You may be directed to eat foods that have a lot of magnesium, such as green leafy vegetables, peas, beans, and nuts. Stopping any intake of alcohol. Follow these instructions at home:     Make sure that your diet includes foods with magnesium. Foods that have a lot of magnesium in them include: Green leafy vegetables, such as spinach and broccoli. Beans and peas. Nuts and seeds, such as almonds and sunflower seeds. Whole grains, such as whole grain bread and fortified cereals. Take magnesium supplements if your health care provider tells you to do that. Take them as directed. Take over-the-counter and  prescription medicines only as told by your health care provider. Have your magnesium levels monitored as told by your health care provider. When you are active, drink fluids that contain electrolytes. Avoid drinking alcohol. Keep all follow-up visits as told by your health care provider. This is important. Contact a health care provider if: You get worse instead of better. Your symptoms return. Get help right away if you: Develop severe muscle weakness. Have trouble breathing. Feel that  your heart is racing. Summary Hypomagnesemia is a condition in which the level of magnesium in the blood is low. Hypomagnesemia can affect every organ in the body. Treatment may include eating more foods that contain magnesium, taking magnesium supplements, and not drinking alcohol. Have your magnesium levels monitored as told by your health care provider. This information is not intended to replace advice given to you by your health care provider. Make sure you discuss any questions you have with your health care provider. Document Released: 09/03/2005 Document Revised: 11/20/2017 Document Reviewed: 11/09/2017 Elsevier Patient Education  2020 [employer].  Hypokalemia Hypokalemia means that the amount of potassium in the blood is lower than normal. Potassium is a chemical (electrolyte) that helps regulate the amount of fluid in the body. It also stimulates muscle tightening (contraction) and helps nerves work properly. Normally, most of the body's potassium is inside cells, and only a very small amount is in the blood. Because the amount in the blood is so small, minor changes to potassium levels in the blood can be life-threatening. What are the causes? This condition may be caused by:  Antibiotic medicine.  Diarrhea or vomiting. Taking too much of a medicine that helps you have a bowel movement (laxative) can cause diarrhea and lead to hypokalemia.  Chronic kidney disease (CKD).  Medicines that help the body get rid of excess fluid (diuretics).  Eating disorders, such as bulimia.  Low magnesium levels in the body.  Sweating a lot. What are the signs or symptoms? Symptoms of this condition include:  Weakness.  Constipation.  Fatigue.  Muscle cramps.  Mental confusion.  Skipped heartbeats or irregular heartbeat (palpitations).  Tingling or numbness. How is this diagnosed? This condition is diagnosed with a blood test. How is this treated? This condition may be  treated by:  Taking potassium supplements by mouth.  Adjusting the medicines that you take.  Eating more foods that contain a lot of potassium. If your potassium level is very low, you may need to get potassium through an IV and be monitored in the hospital. Follow these instructions at home:   Take over-the-counter and prescription medicines only as told by your health care provider. This includes vitamins and supplements.  Eat a healthy diet. A healthy diet includes fresh fruits and vegetables, whole grains, healthy fats, and lean proteins.  If instructed, eat more foods that contain a lot of potassium. This includes: ? Nuts, such as peanuts and pistachios. ? Seeds, such as sunflower seeds and pumpkin seeds. ? Peas, lentils, and lima beans. ? Whole grain and bran cereals and breads. ? Fresh fruits and vegetables, such as apricots, avocado, bananas, cantaloupe, kiwi, oranges, tomatoes, asparagus, and potatoes. ? Orange juice. ? Tomato juice. ? Red meats. ? Yogurt.  Keep all follow-up visits as told by your health care provider. This is important. Contact a health care provider if you:  Have weakness that gets worse.  Feel your heart pounding or racing.  Vomit.  Have diarrhea.  Have diabetes (diabetes mellitus) and you have  trouble keeping your blood sugar (glucose) in your target range. Get help right away if you:  Have chest pain.  Have shortness of breath.  Have vomiting or diarrhea that lasts for more than 2 days.  Faint. Summary  Hypokalemia means that the amount of potassium in the blood is lower than normal.  This condition is diagnosed with a blood test.  Hypokalemia may be treated by taking potassium supplements, adjusting the medicines that you take, or eating more foods that are high in potassium.  If your potassium level is very low, you may need to get potassium through an IV and be monitored in the hospital. This information is not intended to  replace advice given to you by your health care provider. Make sure you discuss any questions you have with your health care provider. Document Released: 12/08/2005 Document Revised: 07/21/2018 Document Reviewed: 07/21/2018 Elsevier Patient Education  2020 [employer].

## 2019-12-13 NOTE — Progress Notes (Signed)
[place] OFFICE PROGRESS NOTE   Diagnosis: Anal cancer  INTERVAL HISTORY:   Joanne Klein completed radiation on 11/24/2019.  She reports diarrhea has been proved.  She continues to have 1 or 2 loose stools on some days, but she does not have diarrhea every day.  She has nausea after eating.  She takes Compazine, but continues to have nausea.  She feels "dizzy "and short of breath when standing up.  Objective:  Vital signs in last 24 hours:  Blood pressure (!) 98/56, pulse (!) 106, temperature 98.7 F (37.1 C), temperature source Temporal, resp. rate 19, height 5\' 6"  (1.676 m), weight 221 lb 14.4 oz (100.7 kg), SpO2 100 %.    HEENT: The mucous membranes are moist, no thrush or ulcers  Resp: Lungs clear, no respiratory distress Cardio: Regular rate and rhythm GI: Nontender, no hepatomegaly Vascular: No leg edema   Skin: Radiation hyperpigmentation of the perineum, superficial skin breakdown at the upper gluteal fold, hemorrhoids at the anal verge    Lab Results:  Lab Results  Component Value Date   WBC 2.5 (L) 12/13/2019   HGB 9.0 (L) 12/13/2019   HCT 26.2 (L) 12/13/2019   MCV 93.6 12/13/2019   PLT 54 (L) 12/13/2019   NEUTROABS 1.5 (L) 12/13/2019    CMP  Lab Results  Component Value Date   NA 143 12/13/2019   K 3.1 (L) 12/13/2019   CL 103 12/13/2019   CO2 32 12/13/2019   GLUCOSE 132 (H) 12/13/2019   BUN 19 12/13/2019   CREATININE 0.84 12/13/2019   CALCIUM 8.8 (L) 12/13/2019   PROT 6.7 12/13/2019   ALBUMIN 3.4 (L) 12/13/2019   AST 16 12/13/2019   ALT 9 12/13/2019   ALKPHOS 75 12/13/2019   BILITOT 1.0 12/13/2019   GFRNONAA >60 12/13/2019   GFRAA >60 12/13/2019     Medications: I have reviewed the patient's current medications.   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 Biopsy 08/30/2019 confirmed well-differentiated squamous cell carcinoma with positive P 16 and p63 stains ? CTs 09/14/2019-abnormal soft tissue  fullness at the lower anus/perianal soft tissues, asymmetric left inguinal lymphadenopathy, borderline enlarged left external iliac node, mild stranding and cutaneous thickening of the left gluteal fold, 2 to 3 mm pulmonary nodules, 1.6 cm right hepatic lesion-likely a hemangioma ? Began concurrent chemoRT with 5FU/mitomycin on 10/03/2019 ? Cycle 2 5-FU, dose reduced, mitomycin held 11/07/2019 ? Radiation completed 11/24/2019 2. COPD 3. Fibromyalgia 4. Chronic back pain 5. Hypothyroid 6. Depression 7. History of uterine cancer-age 32 8. Pain secondary to #1 9. Orthostatic hypotension, fatigue, dyspnea on exertion, early mucositis requiring supportive care on day 9, secondary to chemotherapy  10. Thrombocytopenia PLT 58K and neutropenia ANC 1.4 on day 9, secondary to chemotherapy 11. Worsening cytopenias, PLT 8K and ANC 0.0 on day 15, secondary to chemotherapy. Started prophylaxis with cipro on 10/22 day 11 12. Hospital admission 10/17/2019 -fatigue, pancytopenia, hypokalemia    Disposition: Joanne Klein completed the course of radiation approximately 3 weeks ago.  She continues to have pancytopenia, though this has improved.  She is symptomatic with dizziness and dyspnea.  Her symptoms may be in part related to COPD, anemia, and dehydration.  I encouraged her to increase her fluid intake.  She will try dietary supplements.  We will replete the magnesium and potassium.  She will return for a lab visit in 2 weeks and an office visit in 1 month. She is scheduled to see Dr. Sharlet Salina later  this week.  The etiology of her nausea is unclear.  I doubt this is related to the chemotherapy or radiation.  25 minutes were spent with the patient today.  The majority of the time was used for counseling and coordination of care.  Betsy Coder, MD  12/13/2019  1:51 PM

## 2019-12-13 NOTE — Progress Notes (Signed)
[facility] CSW Progress Notes  Spoke w niece who was home waiting for patient to complete treatment at [facility]. CSW [place] requested to see her if possible, undersigned CSW also called the home in hopes of talking w patient.  Daughter reports patient has great difficulty sleeping, has anxiety/panic attacks intermittently.  Per daughter, difficulties w anxiety and sleeping have accompanied treatment for cancer.  Also has nausea, diarrhea.  Has difficulty eating, becomes nauseous when she tries to eat.  Has fallen several times, reports dizziness.  Family has appointment w PCP on 12/24 to discuss issues with dizziness, oxygen, .  Did struggle with depression during chemotherapy.  Had depression in remote past in context of difficult marriage.    Patient has walker, canes and LifeAlert in case she falls.  Niece lives on next block.  Niece has been spending the night w patient during chemotherapy, is now transitioning back to her own home.  Niece will discuss [place] resources with patient, calendar emailed to patient.  Niece also states that patient will be having PCP visit 12/24 and hopes to address issues of anxiety, sleep disturbances and similar at that visit.  No further needs at this time.  Edwyna Shell, LCSW Clinical Social Worker Phone:  [phone] Cell:  [phone]

## 2019-12-14 ENCOUNTER — Telehealth: Payer: Self-pay | Admitting: Oncology

## 2019-12-14 ENCOUNTER — Telehealth: Payer: Self-pay | Admitting: Radiation Oncology

## 2019-12-14 DIAGNOSIS — J449 Chronic obstructive pulmonary disease, unspecified: Secondary | ICD-10-CM | POA: Diagnosis not present

## 2019-12-14 DIAGNOSIS — C210 Malignant neoplasm of anus, unspecified: Secondary | ICD-10-CM

## 2019-12-14 NOTE — Telephone Encounter (Signed)
  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  Interval Since Last Radiation:  4 weeks   10/03/2019-11/24/2019: The anus and regional nodes were treated to 54 Gy in 30 fractions  Narrative:  The patient was contacted today for routine follow-up. During treatment she did very well with radiotherapy and did not have significant desquamation. She reports she feels like her skin has healed up. She still has some urinary and fecal urgency but things are getting better each week per report and she is using imodium for her loose stools with good control of her symptoms.  Impression/Plan: 1.  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 The patient has been doing well since completion of radiotherapy. We discussed that we would be happy to continue to follow her in one year's time to also perform her GYN care, but she will also continue to follow up with Dr. Benay Spice in medical oncology. I will copy Dr. Marcello Moores as well as the patient will need routine anoscopic screening.  2. Risks of radiation fibrosis. The patient will be referred to Earlie Counts, PT at [place] at [facility] I will have a set of vaginal dilators at the front desk for her when she comes on 12/27/2018 and we discussed the importance and use of these.     Carola Rhine, PAC

## 2019-12-14 NOTE — Telephone Encounter (Signed)
Scheduled per los. Called and spoke with patient. Confirmed appt 

## 2019-12-14 NOTE — Telephone Encounter (Signed)
Scheduled appt per 12/522 sch message  - pt aware of appt date and time

## 2019-12-15 ENCOUNTER — Encounter: Payer: Self-pay | Admitting: Internal Medicine

## 2019-12-15 ENCOUNTER — Other Ambulatory Visit: Payer: Self-pay

## 2019-12-15 ENCOUNTER — Ambulatory Visit (INDEPENDENT_AMBULATORY_CARE_PROVIDER_SITE_OTHER): Payer: Medicare Other | Admitting: Internal Medicine

## 2019-12-15 VITALS — BP 148/90 | HR 96 | Temp 98.2°F | Ht 66.0 in

## 2019-12-15 DIAGNOSIS — J9611 Chronic respiratory failure with hypoxia: Secondary | ICD-10-CM | POA: Diagnosis not present

## 2019-12-15 DIAGNOSIS — I951 Orthostatic hypotension: Secondary | ICD-10-CM

## 2019-12-15 DIAGNOSIS — R200 Anesthesia of skin: Secondary | ICD-10-CM | POA: Diagnosis not present

## 2019-12-15 DIAGNOSIS — R202 Paresthesia of skin: Secondary | ICD-10-CM | POA: Insufficient documentation

## 2019-12-15 DIAGNOSIS — C210 Malignant neoplasm of anus, unspecified: Secondary | ICD-10-CM

## 2019-12-15 NOTE — Assessment & Plan Note (Signed)
Likely some related to low blood counts still. Asked her to do high sodium diet to help. If not effective can try midodrine.

## 2019-12-15 NOTE — Assessment & Plan Note (Signed)
On oxygen at this time and we talked about how low RBC count can make hypoxia worse and we will reassess need for oxygen once RBC back to normal. For now continue oxygen all the time.

## 2019-12-15 NOTE — Assessment & Plan Note (Signed)
This is causing her diarrhea and we talked about how it can be months until she reaches new normal which may not be the same as prior normal with bowels. We talked about potentially using stool softener all the time instead of miralax to see if we can lessen both imodium and mirlax usage. She is on chronic opioids so may need chronic bowel regimen and was not regular prior to treatment.

## 2019-12-15 NOTE — Progress Notes (Signed)
   Subjective:   Patient ID: Joanne Klein, female    DOB: 07/10/1946, 73 y.o.   MRN: [id number]  HPI The patient is a 73 YO female coming in for numbness in left hand (it is the 4-5th fingers, started about 2 weeks ago, having no other numbness or weakness or confusion, all the time, some clumsiness with the lack of feeling, overall it is stable, has tried brace but could not tolerate at night time so tried only for a few hours, having some muscle pain and spasm in the back of the neck), and dizziness (with standing BP is dropping, has had several infusions of saline with oncology most recently 12/13/19, this did help a little, trying to drink fluids, having diarrhea and nausea, eating okay but not as much as usual, denies vertigo or room spinning) and diarrhea (since treatment of her cancer, some days lots of diarrhea and has to take imodium and then will not go for 2-3 days and takes miralax and then gets diarrhea again, denies blood in diarrhea). Recent chemotherapy for anal cancer with 5FU/mitomycin in October and November. Radiation also which completed early December.   Review of Systems  Constitutional: Positive for activity change, appetite change and fatigue.  HENT: Negative.   Eyes: Negative.   Respiratory: Negative for cough, chest tightness and shortness of breath.   Cardiovascular: Negative for chest pain, palpitations and leg swelling.  Gastrointestinal: Positive for diarrhea and nausea. Negative for abdominal distention, abdominal pain, constipation and vomiting.  Musculoskeletal: Positive for arthralgias, back pain and myalgias.  Skin: Negative.   Neurological: Positive for dizziness, weakness, light-headedness and numbness.  Psychiatric/Behavioral: Negative.     Objective:  Physical Exam Constitutional:      Appearance: She is well-developed. She is ill-appearing.  HENT:     Head: Normocephalic and atraumatic.  Cardiovascular:     Rate and Rhythm: Normal rate and regular  rhythm.  Pulmonary:     Effort: Pulmonary effort is normal. No respiratory distress.     Breath sounds: Normal breath sounds. No wheezing or rales.  Abdominal:     General: Bowel sounds are normal. There is no distension.     Palpations: Abdomen is soft.     Tenderness: There is no abdominal tenderness. There is no rebound.  Musculoskeletal:        General: Tenderness present.     Cervical back: Normal range of motion.     Comments: Some tenderness left cervical region  Skin:    General: Skin is warm and dry.  Neurological:     Mental Status: She is alert and oriented to person, place, and time.     Coordination: Coordination abnormal.     Comments: In wheelchair     Vitals:   12/15/19 1051  BP: (!) 148/90  Pulse: 96  Temp: 98.2 F (36.8 C)  TempSrc: Oral  SpO2: 99%  Height: 5\' 6"  (1.676 m)    This visit occurred during the SARS-CoV-2 public health emergency.  Safety protocols were in place, including screening questions prior to the visit, additional usage of staff PPE, and extensive cleaning of exam room while observing appropriate contact time as indicated for disinfecting solutions.   Assessment & Plan:  Visit time 40 minutes: greater than 50% of that time was spent in face to face counseling and coordination of care with the patient: counseled about effects of chemo, healing time for blood counts and gut, hypoxia's association with low RBCs

## 2019-12-15 NOTE — Assessment & Plan Note (Signed)
Could be related to muscle spasm in the neck versus carpal tunnel. We talked about remote possibility of neuropathy from chemo which her meds are not high risk for this versus low possibility of stroke. She will wear brace during the daytime for 1-2 weeks and see if she gets difference.

## 2019-12-15 NOTE — Patient Instructions (Signed)
We will have you use the brace in the daytime for 1-2 weeks for the numbness.   Increase the salt in the diet to help with blood pressure and drink plenty of fluids.

## 2019-12-26 ENCOUNTER — Ambulatory Visit: Payer: Self-pay | Admitting: Radiation Oncology

## 2019-12-26 ENCOUNTER — Inpatient Hospital Stay
Admission: RE | Admit: 2019-12-26 | Discharge: 2019-12-26 | Disposition: A | Discharge status: Home / Self Care | Payer: Medicare Other | Source: Ambulatory Visit | Attending: Radiation Oncology | Admitting: Radiation Oncology

## 2019-12-27 ENCOUNTER — Emergency Department (HOSPITAL_COMMUNITY): Payer: Medicare Other

## 2019-12-27 ENCOUNTER — Emergency Department (HOSPITAL_COMMUNITY)
Admission: EM | Admit: 2019-12-27 | Discharge: 2019-12-27 | Disposition: A | Discharge status: Home / Self Care | Payer: Medicare Other | Attending: Emergency Medicine | Admitting: Emergency Medicine

## 2019-12-27 ENCOUNTER — Telehealth: Payer: Self-pay | Admitting: *Deleted

## 2019-12-27 ENCOUNTER — Other Ambulatory Visit: Payer: Self-pay

## 2019-12-27 ENCOUNTER — Encounter (HOSPITAL_COMMUNITY): Payer: Self-pay | Admitting: *Deleted

## 2019-12-27 DIAGNOSIS — R1084 Generalized abdominal pain: Secondary | ICD-10-CM | POA: Diagnosis not present

## 2019-12-27 DIAGNOSIS — J449 Chronic obstructive pulmonary disease, unspecified: Secondary | ICD-10-CM | POA: Diagnosis not present

## 2019-12-27 DIAGNOSIS — K5902 Outlet dysfunction constipation: Secondary | ICD-10-CM | POA: Diagnosis not present

## 2019-12-27 DIAGNOSIS — E039 Hypothyroidism, unspecified: Secondary | ICD-10-CM | POA: Insufficient documentation

## 2019-12-27 DIAGNOSIS — R112 Nausea with vomiting, unspecified: Secondary | ICD-10-CM | POA: Diagnosis not present

## 2019-12-27 DIAGNOSIS — N390 Urinary tract infection, site not specified: Secondary | ICD-10-CM | POA: Diagnosis not present

## 2019-12-27 DIAGNOSIS — Z743 Need for continuous supervision: Secondary | ICD-10-CM | POA: Diagnosis not present

## 2019-12-27 DIAGNOSIS — R1110 Vomiting, unspecified: Secondary | ICD-10-CM | POA: Diagnosis not present

## 2019-12-27 DIAGNOSIS — Z87891 Personal history of nicotine dependence: Secondary | ICD-10-CM | POA: Diagnosis not present

## 2019-12-27 DIAGNOSIS — R110 Nausea: Secondary | ICD-10-CM | POA: Diagnosis not present

## 2019-12-27 DIAGNOSIS — I10 Essential (primary) hypertension: Secondary | ICD-10-CM | POA: Insufficient documentation

## 2019-12-27 DIAGNOSIS — K5900 Constipation, unspecified: Secondary | ICD-10-CM | POA: Diagnosis not present

## 2019-12-27 DIAGNOSIS — Z79899 Other long term (current) drug therapy: Secondary | ICD-10-CM | POA: Insufficient documentation

## 2019-12-27 LAB — COMPREHENSIVE METABOLIC PANEL
ALT: 13 U/L (ref 0–44)
AST: 14 U/L — ABNORMAL LOW (ref 15–41)
Albumin: 3.5 g/dL (ref 3.5–5.0)
Alkaline Phosphatase: 62 U/L (ref 38–126)
Anion gap: 8 (ref 5–15)
BUN: 18 mg/dL (ref 8–23)
CO2: 30 mmol/L (ref 22–32)
Calcium: 9.1 mg/dL (ref 8.9–10.3)
Chloride: 103 mmol/L (ref 98–111)
Creatinine, Ser: 0.82 mg/dL (ref 0.44–1.00)
GFR calc Af Amer: >60 mL/min (ref >60)
GFR calc non Af Amer: >60 mL/min (ref >60)
Glucose, Bld: 113 mg/dL — ABNORMAL HIGH (ref 70–99)
Potassium: 4 mmol/L (ref 3.5–5.1)
Sodium: 141 mmol/L (ref 135–145)
Total Bilirubin: 1.1 mg/dL (ref 0.3–1.2)
Total Protein: 6.7 g/dL (ref 6.5–8.1)

## 2019-12-27 LAB — URINALYSIS, ROUTINE W REFLEX MICROSCOPIC
Bilirubin Urine: NEGATIVE
Glucose, UA: NEGATIVE mg/dL
Hgb urine dipstick: NEGATIVE
Ketones, ur: NEGATIVE mg/dL
Nitrite: NEGATIVE
Protein, ur: NEGATIVE mg/dL
Specific Gravity, Urine: 1.011 (ref 1.005–1.030)
pH: 7 (ref 5.0–8.0)

## 2019-12-27 LAB — LIPASE, BLOOD: Lipase: 22 U/L (ref 11–51)

## 2019-12-27 LAB — CBC WITH DIFFERENTIAL/PLATELET
Abs Immature Granulocytes: 0.05 10*3/uL (ref 0.00–0.07)
Basophils Absolute: 0 10*3/uL (ref 0.0–0.1)
Basophils Relative: 1 %
Eosinophils Absolute: 0 10*3/uL (ref 0.0–0.5)
Eosinophils Relative: 1 %
HCT: 29.1 % — ABNORMAL LOW (ref 36.0–46.0)
Hemoglobin: 9.6 g/dL — ABNORMAL LOW (ref 12.0–15.0)
Immature Granulocytes: 1 %
Lymphocytes Relative: 12 %
Lymphs Abs: 0.5 10*3/uL — ABNORMAL LOW (ref 0.7–4.0)
MCH: 33.3 pg (ref 26.0–34.0)
MCHC: 33 g/dL (ref 30.0–36.0)
MCV: 101 fL — ABNORMAL HIGH (ref 80.0–100.0)
Monocytes Absolute: 0.5 10*3/uL (ref 0.1–1.0)
Monocytes Relative: 12 %
Neutro Abs: 2.9 10*3/uL (ref 1.7–7.7)
Neutrophils Relative %: 73 %
Platelets: 109 10*3/uL — ABNORMAL LOW (ref 150–400)
RBC: 2.88 MIL/uL — ABNORMAL LOW (ref 3.87–5.11)
RDW: 19.9 % — ABNORMAL HIGH (ref 11.5–15.5)
WBC: 3.9 10*3/uL — ABNORMAL LOW (ref 4.0–10.5)
nRBC: 0 % (ref 0.0–0.2)

## 2019-12-27 LAB — MAGNESIUM: Magnesium: 2 mg/dL (ref 1.7–2.4)

## 2019-12-27 MED ORDER — SODIUM CHLORIDE 0.9 % IV SOLN
INTRAVENOUS | Status: DC
  Administered 2019-12-27: 10:00:00 20 mL/h via INTRAVENOUS

## 2019-12-27 MED ORDER — IOHEXOL 300 MG/ML  SOLN
100.0000 mL | Freq: Once | INTRAMUSCULAR | Status: AC | PRN
  Administered 2019-12-27: 100 mL via INTRAVENOUS

## 2019-12-27 MED ORDER — CEPHALEXIN 500 MG PO CAPS: 500.0000 mg | ORAL_CAPSULE | Freq: Three times a day (TID) | ORAL | 0 refills | Status: DC

## 2019-12-27 MED ORDER — SODIUM CHLORIDE (PF) 0.9 % IJ SOLN
INTRAMUSCULAR | Status: AC
  Filled 2019-12-27: qty 50

## 2019-12-27 NOTE — ED Triage Notes (Addendum)
EMS reports pt has had constipation x 1 week, Last Radiation Dec 3rd. Pt on 3L 02, 180/110-90-16-96 3 L, CBG 137 Temp 98.7. Feels pressure in stomach due to no BM, Pt has rectal Cancer

## 2019-12-27 NOTE — Telephone Encounter (Signed)
Notified patient that there Mg+ is normal as well as K+ level. Continue current meds and can re-evaluate when seen later in the month.

## 2019-12-27 NOTE — Telephone Encounter (Addendum)
VM from daughter requesting call asap. Patient was taken to emergency room today by EMS. Called back and was informed they are taking her home now. No bowel obstruction, but some stool in rectal area despite Miralax 17 grams daily. She also has a slight UTI-ordered antibiotic for her. Having some nausea as well. Has oxygen at home and they were not given any oxygen for the drive home. Daughter is picking up a tank from cancer center and will return it later today. Informed daughter and then patient of her CBC and CMP results (improved). Called [facility] lab and requested to add a Mg+ level today to avoid her needing to come in tomorrow for labs. Instructed her to increase her MiraLax to bid until bowels start moving better. Can take her Zofran every 8 hours as needed for her nausea.

## 2019-12-27 NOTE — ED Notes (Signed)
Pt ambulated to the restroom with standby assist. While attempting to have a BM, pt yelling "oh god help me". NT asked if pt was ok, pt stated that she was. When finished, pt proceeded to wipe numerous times stating "it just keeps leaking out". Pt now back in bed, warm blanket given, and vitals reassessed. RN aware.

## 2019-12-27 NOTE — ED Provider Notes (Signed)
House DEPT Provider Note   CSN: [id number] Arrival date & time: 12/27/19  0805     History Chief Complaint  Patient presents with  . Constipation    DEAISHA Klein is a 74 y.o. female.  74 year old female presents with several days of constipation.  Patient does chronically take opiates for history of rectal cancer.  Denies any fever or chills.  No bloody stools.  Nausea but no vomiting.  Last bowel movement was a week ago.  Patient has been using stool softeners without resolve.  Denies any urinary symptoms.        Past Medical History:  Diagnosis Date  . Allergic rhinitis   . Arthritis   . Bursitis    Both Shoulders  . Chronic low back pain   . Degenerative disk disease   . Depression   . Diverticulitis   . Emphysema of lung ([place])   . GERD (gastroesophageal reflux disease)   . History of fibromyalgia   . Hypertension   . Hypothyroidism   . Irritable bowel syndrome   . Neuralgia, post-herpetic   . Obesity, unspecified   . Obstructive sleep apnea   . OCD (obsessive compulsive disorder)   . Pain in joint, site unspecified   . Pure hypercholesterolemia   . Scoliosis   . Situational stress   . Spastic colon   . Tubular adenoma   . Uterine cancer [facility])     Patient Active Problem List   Diagnosis Date Noted  . Chronic respiratory failure with hypoxia ([place]) 12/15/2019  . Orthostatic hypotension 12/15/2019  . Numbness 12/15/2019  . Chemotherapy-induced neutropenia ([place])   . Thrombocytopenia ([place]) 10/17/2019  . Anal cancer ([place]) 09/15/2019  . Routine general medical examination at a health care facility 11/19/2016  . 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 Morbid obesity ([place]) 06/26/2014  . COPD GOLD II    . Hyperlipidemia   . Arthritis 01/16/2014  . Depression 01/13/2014  . Hypothyroidism 01/13/2014  . GERD (gastroesophageal reflux disease) 01/13/2014  . OCD (obsessive compulsive disorder) 01/13/2014  . Fibromyalgia 01/13/2014     Past Surgical History:  Procedure Laterality Date  . BREAST LUMPECTOMY     knot removed  . CHOLECYSTECTOMY    . HYSTEROTOMY    . TONSILLECTOMY    . tumor in left forearm       OB History   No obstetric history on file.     Family History  Problem Relation Age of Onset  . Stomach cancer Father   . Liver cancer Father   . Diabetes Father   . Colon cancer Neg Hx     Social History   Tobacco Use  . Smoking status: Former Smoker    Packs/day: 2.00    Years: 34.00    Pack years: 68.00    Types: Cigarettes    Quit date: 12/15/1996    Years since quitting: 23.0  . Smokeless tobacco: Never Used  . Tobacco comment: vapes x 2 years  Substance Use Topics  . Alcohol use: No    Alcohol/week: 0.0 standard drinks    Comment: Caffine Intake 2x's daily  . Drug use: No    Home Medications Prior to Admission medications   Medication Sig Start Date End Date Taking? Authorizing Provider  atorvastatin (LIPITOR) 20 MG tablet TAKE 1 TABLET BY MOUTH  EVERY OTHER DAY 11/23/19   Hoyt Koch, MD  Cholecalciferol (VITAMIN D-3) 1000 UNITS CAPS Take 1,000 Units by mouth daily.  [provider]  levothyroxine (SYNTHROID) 75 MCG tablet TAKE 1 TABLET BY MOUTH  DAILY BEFORE BREAKFAST Patient taking differently: Take 75 mcg by mouth daily before breakfast.  08/31/19   Hoyt Koch, MD  loperamide (IMODIUM) 2 MG capsule Take 1 capsule (2 mg total) by mouth as needed for diarrhea or loose stools. 11/16/19   Lucrezia Starch, MD  omeprazole (PRILOSEC) 40 MG capsule TAKE 1 CAPSULE BY MOUTH  DAILY Patient taking differently: Take 40 mg by mouth daily.  04/18/19   Hoyt Koch, MD  ondansetron (ZOFRAN) 8 MG tablet Take 8 mg by mouth 2 (two) times daily.    [provider]  oxyCODONE-acetaminophen (PERCOCET) 10-325 MG tablet Take 1 tablet by mouth every 4 (four) hours as needed for pain. Gets from another provider @@ 180 per month    [provider]  Polyethylene Glycol 3350 (MIRALAX PO) Take 17 g by mouth daily as needed.    [provider]  potassium chloride SA (KLOR-CON) 20 MEQ tablet Take 1 tablet (20 mEq total) by mouth daily. 12/13/19   Ladell Pier, MD  prochlorperazine (COMPAZINE) 10 MG tablet Take 10 mg by mouth every 6 (six) hours as needed for nausea or vomiting.    [provider]  sertraline (ZOLOFT) 100 MG tablet Take 2 tablets (200 mg total) by mouth at bedtime. 02/17/19   Hoyt Koch, MD    Allergies    Prozac [fluoxetine hcl], Codeine, Morphine and related, and Sulfa antibiotics  Review of Systems   Review of Systems  All other systems reviewed and are negative.   Physical Exam Updated Vital Signs BP (!) 147/79 (BP Location: Right Arm)   Pulse 84   Temp 99.3 F (37.4 C) (Oral)   Resp 18   Ht 1.676 m (5\' 6" )   Wt 102.1 kg   SpO2 100%   BMI 36.32 kg/m   Physical Exam Vitals and nursing note reviewed.  Constitutional:      General: She is not in acute distress.    Appearance: Normal appearance. She is well-developed. She is not toxic-appearing.  HENT:     Head: Normocephalic and atraumatic.  Eyes:     General: Lids are normal.     Conjunctiva/sclera: Conjunctivae normal.     Pupils: Pupils are equal, round, and reactive to light.  Neck:     Thyroid: No thyroid mass.     Trachea: No tracheal deviation.  Cardiovascular:     Rate and Rhythm: Normal rate and regular rhythm.     Heart sounds: Normal heart sounds. No murmur. No gallop.   Pulmonary:     Effort: Pulmonary effort is normal. No respiratory distress.     Breath sounds: Normal breath sounds. No stridor. No decreased breath sounds, wheezing, rhonchi or rales.  Abdominal:     General: Bowel sounds are normal. There is no distension.     Palpations: Abdomen is soft.     Tenderness: There is generalized abdominal tenderness. There is no guarding or rebound.  Genitourinary:    Rectum: Tenderness and external  hemorrhoid present.  Musculoskeletal:        General: No tenderness. Normal range of motion.     Cervical back: Normal range of motion and neck supple.  Skin:    General: Skin is warm and dry.     Findings: No abrasion or rash.  Neurological:     Mental Status: She is alert and oriented to person, place,  and time.     GCS: GCS eye subscore is 4. GCS verbal subscore is 5. GCS motor subscore is 6.     Cranial Nerves: No cranial nerve deficit.     Sensory: No sensory deficit.  Psychiatric:        Speech: Speech normal.        Behavior: Behavior normal.     ED Results / Procedures / Treatments   Labs (all labs ordered are listed, but only abnormal results are displayed) Labs Reviewed - No data to display  EKG None  Radiology AAS  Result Date: 12/27/2019 CLINICAL DATA:  Mid abdominal pain and nausea. Constipation. History of rectal cancer. EXAM: DG ABDOMEN ACUTE W/ 1V CHEST COMPARISON:  04/20/2008 FINDINGS: There is no evidence of dilated bowel loops or free intraperitoneal air. No radiopaque calculi or other significant radiographic abnormality is seen. Surgical clips from prior cholecystectomy. No evidence of excessive stool in the colon. No fecal impaction. Heart size and mediastinal contours are within normal limits. Both lungs are clear. IMPRESSION: Benign appearing abdomen and chest. No evidence of excessive stool in the colon. Electronically Signed   By: Lorriane Shire M.D.   On: 12/27/2019 09:01    Procedures Procedures (including critical care time)  Medications Ordered in ED Medications  0.9 %  sodium chloride infusion (has no administration in time range)    ED Course  I have reviewed the triage vital signs and the nursing notes.  Pertinent labs & imaging results that were available during my care of the patient were reviewed by me and considered in my medical decision making (see chart for details).    MDM Rules/Calculators/A&P                      Patients abd  xray neg for obstruction abd ct with abscess or excessive stool but will recommend mag citrate Pt has appetite here  UA w/increased wbcs and will treat Return percautions given Final Clinical Impression(s) / ED Diagnoses Final diagnoses:  None    Rx / DC Orders ED Discharge Orders    None       Lacretia Leigh, MD 12/27/19 1248

## 2019-12-28 ENCOUNTER — Inpatient Hospital Stay: Payer: Medicare Other

## 2019-12-30 ENCOUNTER — Ambulatory Visit: Payer: Medicare Other | Admitting: Internal Medicine

## 2020-01-03 ENCOUNTER — Other Ambulatory Visit: Payer: Self-pay

## 2020-01-03 ENCOUNTER — Ambulatory Visit: Payer: Medicare Other | Attending: Radiation Oncology | Admitting: Physical Therapy

## 2020-01-03 ENCOUNTER — Encounter: Payer: Self-pay | Admitting: Physical Therapy

## 2020-01-03 DIAGNOSIS — M6281 Muscle weakness (generalized): Secondary | ICD-10-CM | POA: Diagnosis not present

## 2020-01-03 DIAGNOSIS — R252 Cramp and spasm: Secondary | ICD-10-CM | POA: Insufficient documentation

## 2020-01-03 NOTE — Patient Instructions (Addendum)
Relaxation Exercises with the Urge to Void   When you experience an urge to void:  FIRST  Stop and stand very still    Sit down if you can    Don't move    You need to stay very still to maintain control  SECOND Squeeze your pelvic floor muscles 5 times, like a quick flick, to keep from leaking  THIRD Relax  Take a deep breath and then let it out  Try to make the urge go away by using relaxation and visualization techniques  FINALLY When you feel the urge go away somewhat, walk normally to the bathroom.   If the urge gets suddenly stronger on the way, you may stop again and relax to regain control.  Access Code: [email or web address]  URL: [email or web address]  Date: 01/03/2020  Prepared by: Jari Favre   Exercises Supine Figure 4 Piriformis Stretch - 3 reps - 1 sets - 30 sec hold - 1x daily - 7x weekly Seated Hamstring Stretch - 3 reps - 1 sets - 30 sec hold - 1x daily - 7x weekly Seated Diaphragmatic Breathing - 10 reps - 3 sets - 1x daily - 7x weekly

## 2020-01-05 DIAGNOSIS — M47812 Spondylosis without myelopathy or radiculopathy, cervical region: Secondary | ICD-10-CM | POA: Diagnosis not present

## 2020-01-05 DIAGNOSIS — Z79891 Long term (current) use of opiate analgesic: Secondary | ICD-10-CM | POA: Diagnosis not present

## 2020-01-05 DIAGNOSIS — G894 Chronic pain syndrome: Secondary | ICD-10-CM | POA: Diagnosis not present

## 2020-01-05 DIAGNOSIS — M797 Fibromyalgia: Secondary | ICD-10-CM | POA: Diagnosis not present

## 2020-01-05 NOTE — Therapy (Addendum)
[facility] [address] [place], [place], [address] Phone: [phone]   Fax:  [phone]  Physical Therapy Evaluation  Patient Details  Name: Joanne Klein MRN: [id number] Date of Birth: Aug 06, 1946 Referring Provider (PT): Hayden Pedro, [place]   Encounter Date: 01/03/2020  PT End of Session - 01/10/20 1337    Visit Number  1    Number of Visits  10    Date for PT Re-Evaluation  03/27/20    Authorization Type  [employer]    PT Start Time  [id number]    PT Stop Time  1615    PT Time Calculation (min)  32 min    Activity Tolerance  Patient limited by fatigue    Behavior During Therapy  [facility] for tasks assessed/performed       Past Medical History:  Diagnosis Date  . Allergic rhinitis   . Arthritis   . Bursitis    Both Shoulders  . Chronic low back pain   . Degenerative disk disease   . Depression   . Diverticulitis   . Emphysema of lung ([place])   . GERD (gastroesophageal reflux disease)   . History of fibromyalgia   . Hypertension   . Hypothyroidism   . Irritable bowel syndrome   . Neuralgia, post-herpetic   . Obesity, unspecified   . Obstructive sleep apnea   . OCD (obsessive compulsive disorder)   . Pain in joint, site unspecified   . Pure hypercholesterolemia   . Scoliosis   . Situational stress   . Spastic colon   . Tubular adenoma   . Uterine cancer [facility])     Past Surgical History:  Procedure Laterality Date  . BREAST LUMPECTOMY     knot removed  . CHOLECYSTECTOMY    . HYSTEROTOMY    . TONSILLECTOMY    . tumor in left forearm      There were no vitals filed for this visit.   Subjective Assessment - 01/10/20 1339    Subjective  Pt arrives to clinic due to referral s/p cancer treatments.  Pt states she is on O2 but doesn't need it if just sitting.  Pt needs W/C in order to get from front to back of the gym where treatment room.    Pertinent History  hx of anal cancer, radiation completed  11/23/2020    Limitations  Walking    How long can you sit comfortably?  no problem    How long can you stand comfortably?  unable    How long can you walk comfortably?  unable    Patient Stated Goals  have more regular BM    Currently in Pain?  No/denies         [facility] PT Assessment - 01/10/20 0001      Assessment   Medical Diagnosis  C21.0 (ICD-10-CM) - Anal cancer [facility])    Referring Provider (PT)  Hayden Pedro, PA-C    Prior Therapy  No      Precautions   Precautions  None      Restrictions   Weight Bearing Restrictions  No      Balance Screen   Has the patient fallen in the past 6 months  Yes    How many times?  1x    Has the patient had a decrease in activity level because of a fear of falling?   No    Is the patient reluctant to leave their home because of a  fear of falling?   --   doesn't go out     [place] residence    Living Arrangements  Alone      Prior Function   Level of Independence  Independent      Cognition   Overall Cognitive Status  Within Functional Limits for tasks assessed      Posture/Postural Control   Posture/Postural Control  Postural limitations    Postural Limitations  Rounded Shoulders;Increased thoracic kyphosis      ROM / Strength   AROM / PROM / Strength  Strength;PROM      PROM   Overall PROM Comments  hip IR/ER 75%      Strength   Overall Strength Comments  MMT LE grossly 4-/5 bilateral      Flexibility   Soft Tissue Assessment /Muscle Length  yes    Hamstrings  50%      Ambulation/Gait   Ambulation Distance (Feet)  10 Feet   needs W/C due to fatigue and no O2   Gait Pattern  Decreased stride length    Gait Comments  W/C used after walking only 10 feet from waiting area                Objective measurements completed on examination: See above findings.    Pelvic Floor Special Questions - 01/10/20 0001    Prior Pregnancies  Yes    Number of Pregnancies  3     Number of Vaginal Deliveries  1    Urinary Leakage  No    Pad use  usually no, haven't had diarrhea lately    Urinary urgency  Yes   more urgent getting close to the bathroom   Urinary frequency  every 1-2 hours    Fecal incontinence  Yes   2 weeks ago; no BM for 7    Falling out feeling (prolapse)  No    External Palpation  contract and hold for 2 seconds    Exam Type  Deferred   d/t limited time; pt arrived late      [facility] Adult PT Treatment/Exercise - 01/10/20 0001      Self-Care   Self-Care  Other Self-Care Comments    Other Self-Care Comments   intial HEP and urge to void techniques               PT Short Term Goals - 01/09/20 1213      PT SHORT TERM GOAL #1   Title  Pt will have understanding of toileting techniques and implement at home for improved BM    Time  4    Period  Weeks    Status  New    Target Date  01/31/20      PT SHORT TERM GOAL #2   Title  pt will be ind with intial HEP    Time  4    Period  Weeks    Status  New    Target Date  01/31/20      PT SHORT TERM GOAL #3   Title  Pt will understand how to use vaginal dilators    Time  4    Period  Weeks    Status  New    Target Date  01/31/20        PT Long Term Goals - 01/09/20 1212      PT LONG TERM GOAL #1   Title  Pt will be ind with advanced HEP  Time  12    Period  Weeks    Status  New    Target Date  03/27/20      PT LONG TERM GOAL #2   Title  Pt will have 50% less pain    Time  12    Period  Weeks    Status  New    Target Date  03/27/20      PT LONG TERM GOAL #3   Title  Pt will report 50% more ease with bowel movements due to improved muscle coordination    Time  12    Period  Weeks    Status  New    Target Date  03/27/20      PT LONG TERM GOAL #4   Title  Pt will have improved LE strength of at least 4/5 hip abduction for improved safety during functional activities    Time  12    Period  Weeks    Status  New    Target Date  03/27/20              Plan - 01/10/20 1358    Clinical Impression Statement  Pt presents to clinic s/p treatments for anal cancer. She has vaginal dilators but is unaware of how to use for improved pelvic tissue health. Pt has deconditioning and is on O2. Pt demonstrates LE weakness grossly 4/5. Pt has low back pain with sit to stand or supine to sit transfers. Pt has high tone pelvic floor and low endurance of 2 seconds. Pt has experienced a fall and feels generally off balance. Pt will benefit from skilled PT to address impairments and for reduced risk of falls and maximum level of independence.    Personal Factors and Comorbidities  Age;Comorbidity 3+    Comorbidities  history of anal cancer, chronic pain, history of radiation and chemo    Examination-Activity Limitations  Locomotion Level;Toileting    Examination-Participation Restrictions  Community Activity;Other   self care post radiation treatments   Stability/Clinical Decision Making  Evolving/Moderate complexity    Rehab Potential  Excellent    PT Frequency  2x / week    PT Duration  12 weeks    PT Treatment/Interventions  ADLs/Self Care Home Management;Biofeedback;Cryotherapy;Electrical Stimulation;Moist Heat;Neuromuscular re-education;Therapeutic exercise;Therapeutic activities;Patient/family education;Manual techniques;Passive range of motion;Dry needling;Taping    PT Next Visit Plan  breathing and bulging pelvic floor, ROM and stretching, how to use dilators    PT Home Exercise Plan  Access Code: [email or web address]    Consulted and Agree with Plan of Care  Patient       Patient will benefit from skilled therapeutic intervention in order to improve the following deficits and impairments:  Abnormal gait, Cardiopulmonary status limiting activity, Decreased activity tolerance, Decreased strength, Postural dysfunction, Pain, Impaired tone, Increased fascial restricitons, Decreased range of motion  Visit Diagnosis: Cramp and spasm  Muscle weakness  (generalized)     Problem List Patient Active Problem List   Diagnosis Date Noted  . Chronic respiratory failure with hypoxia ([place]) 12/15/2019  . Orthostatic hypotension 12/15/2019  . Numbness 12/15/2019  . Chemotherapy-induced neutropenia ([place])   . Thrombocytopenia ([place]) 10/17/2019  . 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 Routine general medical examination at a health care facility 11/19/2016  . 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 Morbid obesity ([place]) 06/26/2014  . COPD GOLD II    . Hyperlipidemia   . Arthritis 01/16/2014  . Depression 01/13/2014  . Hypothyroidism 01/13/2014  . GERD (gastroesophageal reflux disease) 01/13/2014  .  OCD (obsessive compulsive disorder) 01/13/2014  . Fibromyalgia 01/13/2014    Jule Ser, PT 01/10/2020, 1:58 PM  [facility] [address] [place], [place], [address] Phone: [phone]   Fax:  [phone]  Name: Joanne Klein MRN: [id number] Date of Birth: 03-24-1946

## 2020-01-10 ENCOUNTER — Encounter: Payer: Self-pay | Admitting: Nurse Practitioner

## 2020-01-10 ENCOUNTER — Other Ambulatory Visit: Payer: Self-pay

## 2020-01-10 ENCOUNTER — Inpatient Hospital Stay: Payer: Medicare Other | Attending: Oncology | Admitting: Nurse Practitioner

## 2020-01-10 ENCOUNTER — Inpatient Hospital Stay: Payer: Medicare Other

## 2020-01-10 VITALS — BP 138/78 | HR 101 | Temp 98.7°F | Resp 18 | Ht 66.0 in | Wt 219.5 lb

## 2020-01-10 DIAGNOSIS — M797 Fibromyalgia: Secondary | ICD-10-CM | POA: Diagnosis not present

## 2020-01-10 DIAGNOSIS — M549 Dorsalgia, unspecified: Secondary | ICD-10-CM | POA: Diagnosis not present

## 2020-01-10 DIAGNOSIS — D61818 Other pancytopenia: Secondary | ICD-10-CM | POA: Insufficient documentation

## 2020-01-10 DIAGNOSIS — E041 Nontoxic single thyroid nodule: Secondary | ICD-10-CM | POA: Insufficient documentation

## 2020-01-10 DIAGNOSIS — G8929 Other chronic pain: Secondary | ICD-10-CM | POA: Diagnosis not present

## 2020-01-10 DIAGNOSIS — D6959 Other secondary thrombocytopenia: Secondary | ICD-10-CM | POA: Diagnosis not present

## 2020-01-10 DIAGNOSIS — J449 Chronic obstructive pulmonary disease, unspecified: Secondary | ICD-10-CM | POA: Diagnosis not present

## 2020-01-10 DIAGNOSIS — C210 Malignant neoplasm of anus, unspecified: Secondary | ICD-10-CM

## 2020-01-10 DIAGNOSIS — D701 Agranulocytosis secondary to cancer chemotherapy: Secondary | ICD-10-CM | POA: Insufficient documentation

## 2020-01-10 DIAGNOSIS — F329 Major depressive disorder, single episode, unspecified: Secondary | ICD-10-CM | POA: Insufficient documentation

## 2020-01-10 DIAGNOSIS — E039 Hypothyroidism, unspecified: Secondary | ICD-10-CM | POA: Insufficient documentation

## 2020-01-10 DIAGNOSIS — I951 Orthostatic hypotension: Secondary | ICD-10-CM | POA: Diagnosis not present

## 2020-01-10 LAB — CBC WITH DIFFERENTIAL (CANCER CENTER ONLY)
Abs Immature Granulocytes: 0.02 10*3/uL (ref 0.00–0.07)
Basophils Absolute: 0 10*3/uL (ref 0.0–0.1)
Basophils Relative: 1 %
Eosinophils Absolute: 0.1 10*3/uL (ref 0.0–0.5)
Eosinophils Relative: 3 %
HCT: 34.7 % — ABNORMAL LOW (ref 36.0–46.0)
Hemoglobin: 11.5 g/dL — ABNORMAL LOW (ref 12.0–15.0)
Immature Granulocytes: 0 %
Lymphocytes Relative: 12 %
Lymphs Abs: 0.6 10*3/uL — ABNORMAL LOW (ref 0.7–4.0)
MCH: 34.1 pg — ABNORMAL HIGH (ref 26.0–34.0)
MCHC: 33.1 g/dL (ref 30.0–36.0)
MCV: 103 fL — ABNORMAL HIGH (ref 80.0–100.0)
Monocytes Absolute: 0.5 10*3/uL (ref 0.1–1.0)
Monocytes Relative: 11 %
Neutro Abs: 3.4 10*3/uL (ref 1.7–7.7)
Neutrophils Relative %: 73 %
Platelet Count: 104 10*3/uL — ABNORMAL LOW (ref 150–400)
RBC: 3.37 MIL/uL — ABNORMAL LOW (ref 3.87–5.11)
RDW: 16.7 % — ABNORMAL HIGH (ref 11.5–15.5)
WBC Count: 4.7 10*3/uL (ref 4.0–10.5)
nRBC: 0 % (ref 0.0–0.2)

## 2020-01-10 LAB — CMP (CANCER CENTER ONLY)
ALT: 11 U/L (ref 0–44)
AST: 16 U/L (ref 15–41)
Albumin: 3.5 g/dL (ref 3.5–5.0)
Alkaline Phosphatase: 82 U/L (ref 38–126)
Anion gap: 10 (ref 5–15)
BUN: 22 mg/dL (ref 8–23)
CO2: 26 mmol/L (ref 22–32)
Calcium: 8.8 mg/dL — ABNORMAL LOW (ref 8.9–10.3)
Chloride: 104 mmol/L (ref 98–111)
Creatinine: 1.09 mg/dL — ABNORMAL HIGH (ref 0.44–1.00)
GFR, Est AFR Am: 59 mL/min — ABNORMAL LOW (ref >60)
GFR, Estimated: 51 mL/min — ABNORMAL LOW (ref >60)
Glucose, Bld: 103 mg/dL — ABNORMAL HIGH (ref 70–99)
Potassium: 4.2 mmol/L (ref 3.5–5.1)
Sodium: 140 mmol/L (ref 135–145)
Total Bilirubin: 0.8 mg/dL (ref 0.3–1.2)
Total Protein: 7.1 g/dL (ref 6.5–8.1)

## 2020-01-10 LAB — MAGNESIUM: Magnesium: 2.1 mg/dL (ref 1.7–2.4)

## 2020-01-10 NOTE — Progress Notes (Addendum)
[facility] OFFICE PROGRESS NOTE   Diagnosis: Anal cancer  INTERVAL HISTORY:   Joanne Klein returns as scheduled.  She is feeling "2%" better.  She estimates 1 loose stool a day.  She has intermittent nausea.  Perianal skin is better.  She continues to have some tenderness.  She has dyspnea on exertion.  Objective:  Vital signs in last 24 hours:  Blood pressure 138/78, pulse (!) 101, temperature 98.7 F (37.1 C), temperature source Temporal, resp. rate 18, height 5\' 6"  (1.676 m), weight 219 lb 8 oz (99.6 kg), SpO2 95 %.    HEENT: No thrush or ulcers. GI: Abdomen soft and nontender.  No hepatomegaly.  External hemorrhoids. Vascular: No leg edema. Neuro: Alert and oriented. Skin: Skin turgor intact.  Extremities without ecchymoses.  Perianal/perineal skin is hyperpigmented, no breakdown.   Lab Results:  Lab Results  Component Value Date   WBC 4.7 01/10/2020   HGB 11.5 (L) 01/10/2020   HCT 34.7 (L) 01/10/2020   MCV 103.0 (H) 01/10/2020   PLT 104 (L) 01/10/2020   NEUTROABS 3.4 01/10/2020    Imaging:  No results found.  Medications: I have reviewed the patient's current medications.  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 Biopsy 08/30/2019 confirmed well-differentiated squamous cell carcinoma with positive P 16 and p63 stains ? CTs 09/14/2019-abnormal soft tissue fullness at the lower anus/perianal soft tissues, asymmetric left inguinal lymphadenopathy, borderline enlarged left external iliac node, mild stranding and cutaneous thickening of the left gluteal fold, 2 to 3 mm pulmonary nodules, 1.6 cm right hepatic lesion-likely a hemangioma ? PET scan [id number] hypermetabolic anal mass.  3 hypermetabolic left inguinal lymph nodes.  Left external iliac node with minimally higher than blood pool activity.  1.0 x 0.8 cm left thyroid nodule with activity mildly above background blood pool activity but below liver activity.  No  perceptible hypermetabolic activity in the vicinity of the lateral right hepatic lobe lesion seen on the 09/14/2019 CT. ? Began concurrent chemoRT with 5FU/mitomycin on 10/03/2019 ? Cycle25-FU, dose reduced, mitomycin held 11/07/2019 ? Radiation completed 11/24/2019 ? CT abdomen/pelvis 12/27/2019-extensive left-sided colonic diverticulosis without diverticulitis.  No bowel obstruction.  No abscess in the abdomen or pelvis.  Rectum borderline distended with stool without perirectal wall thickening or soft tissue stranding.  Left inguinal and external iliac lymph nodes now subcentimeter compared to the previous size.  Stable small lesion in the right lobe of the liver.  No new liver lesions evident. 2. COPD 3. Fibromyalgia 4. Chronic back pain 5. Hypothyroid 6. Depression 7. History of uterine cancer-age 82 8. Pain secondary to #1 9. Orthostatic hypotension, fatigue, dyspnea on exertion, early mucositis requiring supportive care on day 9, secondary to chemotherapy  10. Thrombocytopenia PLT 58K and neutropenia ANC 1.4 on day 9, secondary to chemotherapy 11. Worsening cytopenias, PLT 8K and ANC 0.0 on day 15, secondary to chemotherapy. Started prophylaxis with cipro on 10/22 day 11 12. Hospital admission 10/17/2019 -fatigue, pancytopenia, hypokalemia 13.  Left thyroid nodule with activity mildly above background blood pool activity but below liver activity on PET scan 09/27/2019.  Referred for thyroid ultrasound. 14.  Tiny lung nodules noted on chest CT 09/14/2019  Disposition: Joanne Klein performance status is slowly improving.  She will continue to focus on increasing oral intake and activity.  She has an appointment scheduled with Dr. Marcello Moores next month for anal surveillance.  We reviewed the labs from today.  Hemoglobin and white count are better.  Platelet  count is stable.  We discussed the left thyroid nodule noted on PET scan 09/27/2019.  We referred her for a thyroid ultrasound to be done in  the next few weeks.  She will return for lab and follow-up in 3 months.  She will contact the office in the interim with any problems.  Patient seen with Dr. Benay Spice.  Ned Card ANP/GNP-BC   01/10/2020  3:19 PM This was a shared visit with Ned Card.  Joanne Klein continues to recover from the course of chemotherapy and radiation.  Anemia and leukopenia have improved.  She will undergo annual surveillance with Dr. Marcello Moores.  There has been clinical and radiologic improved.  We referred her for ultrasound based on the staging PET scan.  Joanne Manson, MD

## 2020-01-10 NOTE — Addendum Note (Signed)
Addended by: Su Hoff on: 01/10/2020 02:03 PM   Modules accepted: Orders

## 2020-01-11 ENCOUNTER — Ambulatory Visit (INDEPENDENT_AMBULATORY_CARE_PROVIDER_SITE_OTHER): Payer: Medicare Other

## 2020-01-11 ENCOUNTER — Telehealth: Payer: Self-pay | Admitting: Oncology

## 2020-01-11 ENCOUNTER — Encounter: Payer: Self-pay | Admitting: Internal Medicine

## 2020-01-11 ENCOUNTER — Ambulatory Visit (INDEPENDENT_AMBULATORY_CARE_PROVIDER_SITE_OTHER): Payer: Medicare Other | Admitting: Internal Medicine

## 2020-01-11 VITALS — BP 132/84 | HR 93 | Temp 98.3°F | Ht 66.0 in

## 2020-01-11 DIAGNOSIS — R200 Anesthesia of skin: Secondary | ICD-10-CM

## 2020-01-11 DIAGNOSIS — J9611 Chronic respiratory failure with hypoxia: Secondary | ICD-10-CM | POA: Diagnosis not present

## 2020-01-11 DIAGNOSIS — C210 Malignant neoplasm of anus, unspecified: Secondary | ICD-10-CM

## 2020-01-11 DIAGNOSIS — R202 Paresthesia of skin: Secondary | ICD-10-CM

## 2020-01-11 DIAGNOSIS — M47812 Spondylosis without myelopathy or radiculopathy, cervical region: Secondary | ICD-10-CM | POA: Diagnosis not present

## 2020-01-11 NOTE — Telephone Encounter (Signed)
Scheduled per 1/19 los. Called and spoke with pt confirmed 4/20 appt

## 2020-01-11 NOTE — Patient Instructions (Signed)
We are checking the x-ray of the neck to see if the nerve is pinched there.   If not we will send you to the hand surgeon to fix the nerve.  You do not need the oxygen anymore at home unless you feel like you cannot breathe.

## 2020-01-11 NOTE — Progress Notes (Signed)
   Subjective:   Patient ID: Joanne Klein, female    DOB: 12/10/1946, 74 y.o.   MRN: [id number]  HPI The patient is a 74 YO female coming in for follow up left hand numbness (using carpal tunnel brace at home for the last several weeks and this has not helped symptoms, 4-5th left fingers numb and feeling like lack of grip strength in that hand also) and chronic respiratory failure (blood counts are improved s/p chemo, using oxygen less at home and feeling less SOB at home, wants to know if she can stop) and bowels (still having diarrhea and alternating imodium and miralax, less now, BP more stable and less orthostatic hypotension, has not needed fluids from oncology recently).   Review of Systems  Constitutional: Negative.   HENT: Negative.   Eyes: Negative.   Respiratory: Negative for cough, chest tightness and shortness of breath.   Cardiovascular: Negative for chest pain, palpitations and leg swelling.  Gastrointestinal: Positive for diarrhea. Negative for abdominal distention, abdominal pain, constipation, nausea and vomiting.  Musculoskeletal: Negative.   Skin: Negative.   Neurological: Positive for weakness and numbness.  Psychiatric/Behavioral: Negative.     Objective:  Physical Exam Constitutional:      Appearance: She is well-developed.  HENT:     Head: Normocephalic and atraumatic.  Cardiovascular:     Rate and Rhythm: Normal rate and regular rhythm.  Pulmonary:     Effort: Pulmonary effort is normal. No respiratory distress.     Breath sounds: Normal breath sounds. No wheezing or rales.     Comments: Stable lung exam, she did walk for 5 minutes in the office with O2 saturations staying at 91% or above.  Abdominal:     General: Bowel sounds are normal. There is no distension.     Palpations: Abdomen is soft.     Tenderness: There is no abdominal tenderness. There is no rebound.  Musculoskeletal:     Cervical back: Normal range of motion.  Skin:    General: Skin is  warm and dry.  Neurological:     Mental Status: She is alert and oriented to person, place, and time.     Coordination: Coordination abnormal.     Comments: Can walk but slow gait, uses wheelchair primarily for long distances     Vitals:   01/11/20 1102  BP: 132/84  Pulse: 93  Temp: 98.3 F (36.8 C)  TempSrc: Oral  SpO2: 94%  Height: 5\' 6"  (1.676 m)    This visit occurred during the SARS-CoV-2 public health emergency.  Safety protocols were in place, including screening questions prior to the visit, additional usage of staff PPE, and extensive cleaning of exam room while observing appropriate contact time as indicated for disinfecting solutions.   Assessment & Plan:

## 2020-01-12 NOTE — Assessment & Plan Note (Signed)
Since brace has not helped will check x-ray neck. If no findings refer to hand surgery.

## 2020-01-12 NOTE — Assessment & Plan Note (Signed)
Today not meeting criteria for oxygen even with exertion and advised that she can still use prn for SOB. Does not need for activity or rest. Can still use nocturnal.

## 2020-01-14 DIAGNOSIS — J449 Chronic obstructive pulmonary disease, unspecified: Secondary | ICD-10-CM | POA: Diagnosis not present

## 2020-01-14 NOTE — Assessment & Plan Note (Signed)
Bowels are slightly improved and again we talked about how she may have a new normal. Hydration is much better now.

## 2020-01-16 ENCOUNTER — Other Ambulatory Visit: Payer: Self-pay | Admitting: Internal Medicine

## 2020-01-16 DIAGNOSIS — M4802 Spinal stenosis, cervical region: Secondary | ICD-10-CM

## 2020-01-23 DIAGNOSIS — C210 Malignant neoplasm of anus, unspecified: Secondary | ICD-10-CM | POA: Diagnosis not present

## 2020-01-24 ENCOUNTER — Encounter: Payer: Self-pay | Admitting: Physical Therapy

## 2020-01-24 ENCOUNTER — Ambulatory Visit: Payer: Medicare Other | Attending: Radiation Oncology | Admitting: Physical Therapy

## 2020-01-24 ENCOUNTER — Other Ambulatory Visit: Payer: Self-pay

## 2020-01-24 DIAGNOSIS — R252 Cramp and spasm: Secondary | ICD-10-CM | POA: Insufficient documentation

## 2020-01-24 DIAGNOSIS — M6281 Muscle weakness (generalized): Secondary | ICD-10-CM | POA: Diagnosis not present

## 2020-01-24 NOTE — Therapy (Signed)
[facility] [address], [place], [place], [address] Phone: [phone]   Fax:  [phone]  Physical Therapy Treatment  Patient Details  Name: Joanne Klein MRN: [id number] Date of Birth: 1946/12/10 Referring Provider (PT): Hayden Pedro, [place]   Encounter Date: 01/24/2020  PT End of Session - 01/24/20 1522    Visit Number  2    Date for PT Re-Evaluation  03/27/20    Authorization Type  [employer]HC    PT Start Time  [id number]    PT Stop Time  1526    PT Time Calculation (min)  38 min       Past Medical History:  Diagnosis Date  . Allergic rhinitis   . Arthritis   . Bursitis    Both Shoulders  . Chronic low back pain   . Degenerative disk disease   . Depression   . Diverticulitis   . Emphysema of lung ([place])   . GERD (gastroesophageal reflux disease)   . History of fibromyalgia   . Hypertension   . Hypothyroidism   . Irritable bowel syndrome   . Neuralgia, post-herpetic   . Obesity, unspecified   . Obstructive sleep apnea   . OCD (obsessive compulsive disorder)   . Pain in joint, site unspecified   . Pure hypercholesterolemia   . Scoliosis   . Situational stress   . Spastic colon   . Tubular adenoma   . Uterine cancer [facility])     Past Surgical History:  Procedure Laterality Date  . BREAST LUMPECTOMY     knot removed  . CHOLECYSTECTOMY    . HYSTEROTOMY    . TONSILLECTOMY    . tumor in left forearm      There were no vitals filed for this visit.  Subjective Assessment - 01/24/20 1527    Subjective  Pt states she has been doing her stretches    Currently in Pain?  Yes    Pain Score  6     Pain Location  Generalized    Multiple Pain Sites  No                       [facility] Adult PT Treatment/Exercise - 01/24/20 0001      Neuro Re-ed    Neuro Re-ed Details   pelvic floor contraction with TC;       Manual Therapy   Manual Therapy  Internal Pelvic Floor    Manual therapy  comments  pt identity confirmed and informed consent given to perform internal soft tissue performed    Internal Pelvic Floor  myfascial release to vaginal canal             PT Education - 01/24/20 1522    Education Details  Access Code: [email or web address]    Person(s) Educated  Patient    Methods  Explanation;Demonstration;Handout;Verbal cues    Comprehension  Verbalized understanding;Returned demonstration       PT Short Term Goals - 01/09/20 1213      PT SHORT TERM GOAL #1   Title  Pt will have understanding of toileting techniques and implement at home for improved BM    Time  4    Period  Weeks    Status  New    Target Date  01/31/20      PT SHORT TERM GOAL #2   Title  pt will be ind with intial HEP    Time  4  Period  Weeks    Status  New    Target Date  01/31/20      PT SHORT TERM GOAL #3   Title  Pt will understand how to use vaginal dilators    Time  4    Period  Weeks    Status  New    Target Date  01/31/20        PT Long Term Goals - 01/09/20 1212      PT LONG TERM GOAL #1   Title  Pt will be ind with advanced HEP    Time  12    Period  Weeks    Status  New    Target Date  03/27/20      PT LONG TERM GOAL #2   Title  Pt will have 50% less pain    Time  12    Period  Weeks    Status  New    Target Date  03/27/20      PT LONG TERM GOAL #3   Title  Pt will report 50% more ease with bowel movements due to improved muscle coordination    Time  12    Period  Weeks    Status  New    Target Date  03/27/20      PT LONG TERM GOAL #4   Title  Pt will have improved LE strength of at least 4/5 hip abduction for improved safety during functional activities    Time  12    Period  Weeks    Status  New    Target Date  03/27/20            Plan - 01/24/20 1528    Clinical Impression Statement  Pt was educated in using dilator.  She tolerated treatment and internal pelvic floor assessment.  Pt was educated in how to correctly engage pelvic floor and  added exercise to HEP.  Pt will benefit from skilled PT to continue to work on improved soft tissue strength and flexibility to regain function in the pelvic floor    PT Treatment/Interventions  ADLs/Self Care Home Management;Biofeedback;Cryotherapy;Electrical Stimulation;Moist Heat;Neuromuscular re-education;Therapeutic exercise;Therapeutic activities;Patient/family education;Manual techniques;Passive range of motion;Dry needling;Taping    PT Next Visit Plan  internal soft tissue as tolerated, biofeedback, f/u on dilator use, toileting technique    PT Home Exercise Plan  Access Code: [email or web address]    Consulted and Agree with Plan of Care  Patient       Patient will benefit from skilled therapeutic intervention in order to improve the following deficits and impairments:  Abnormal gait, Cardiopulmonary status limiting activity, Decreased activity tolerance, Decreased strength, Postural dysfunction, Pain, Impaired tone, Increased fascial restricitons, Decreased range of motion  Visit Diagnosis: Cramp and spasm  Muscle weakness (generalized)     Problem List Patient Active Problem List   Diagnosis Date Noted  . Chronic respiratory failure with hypoxia ([place]) 12/15/2019  . Orthostatic hypotension 12/15/2019  . Numbness 12/15/2019  . Chemotherapy-induced neutropenia ([place])   . Thrombocytopenia ([place]) 10/17/2019  . 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 Routine general medical examination at a health care facility 11/19/2016  . 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 Morbid obesity ([place]) 06/26/2014  . COPD GOLD II    . Hyperlipidemia   . Arthritis 01/16/2014  . Depression 01/13/2014  . Hypothyroidism 01/13/2014  . GERD (gastroesophageal reflux disease) 01/13/2014  . OCD (obsessive compulsive disorder) 01/13/2014  . Fibromyalgia 01/13/2014    Jule Ser, PT 01/24/2020, 3:36 PM  [facility] [address], [place], [place], [address] Phone:  [phone]   Fax:  [phone]  Name: KAMILA AUSBROOKS MRN: [id number] Date of Birth: [birth year]

## 2020-01-24 NOTE — Patient Instructions (Signed)
Access Code: [email or web address]  URL: [email or web address]  Date: 01/24/2020  Prepared by: Jari Favre   Exercises Supine Figure 4 Piriformis Stretch - 3 reps - 1 sets - 30 sec hold - 1x daily - 7x weekly Seated Hamstring Stretch - 3 reps - 1 sets - 30 sec hold - 1x daily - 7x weekly Seated Diaphragmatic Breathing - 10 reps - 1 sets - 1x daily - 7x weekly Supine Hip Adductor Squeeze with Small Ball - 10 reps - 1 sets - 3 sec hold - 3x daily - 7x weekly

## 2020-01-25 DIAGNOSIS — G5622 Lesion of ulnar nerve, left upper limb: Secondary | ICD-10-CM | POA: Diagnosis not present

## 2020-01-25 DIAGNOSIS — M5412 Radiculopathy, cervical region: Secondary | ICD-10-CM | POA: Diagnosis not present

## 2020-01-27 DIAGNOSIS — J449 Chronic obstructive pulmonary disease, unspecified: Secondary | ICD-10-CM | POA: Diagnosis not present

## 2020-01-31 ENCOUNTER — Ambulatory Visit: Payer: Medicare Other | Admitting: Physical Therapy

## 2020-01-31 ENCOUNTER — Encounter: Payer: Self-pay | Admitting: Physical Therapy

## 2020-01-31 ENCOUNTER — Other Ambulatory Visit: Payer: Self-pay

## 2020-01-31 DIAGNOSIS — R252 Cramp and spasm: Secondary | ICD-10-CM

## 2020-01-31 DIAGNOSIS — M6281 Muscle weakness (generalized): Secondary | ICD-10-CM

## 2020-01-31 NOTE — Therapy (Signed)
[facility] [address], [place], [place], [address] Phone: [phone]   Fax:  [phone]  Physical Therapy Treatment  Patient Details  Name: Joanne Klein MRN: [id number] Date of Birth: 10/24/1946 Referring Provider (PT): Hayden Pedro, [place]   Encounter Date: 01/31/2020  PT End of Session - 01/31/20 1449    Visit Number  3    Number of Visits  10    Date for PT Re-Evaluation  03/27/20    Authorization Type  UHC    PT Start Time  [id number]    PT Stop Time  1527    PT Time Calculation (min)  40 min    Activity Tolerance  Patient limited by fatigue    Behavior During Therapy  [facility] for tasks assessed/performed       Past Medical History:  Diagnosis Date  . Allergic rhinitis   . Arthritis   . Bursitis    Both Shoulders  . Chronic low back pain   . Degenerative disk disease   . Depression   . Diverticulitis   . Emphysema of lung ([place])   . GERD (gastroesophageal reflux disease)   . History of fibromyalgia   . Hypertension   . Hypothyroidism   . Irritable bowel syndrome   . Neuralgia, post-herpetic   . Obesity, unspecified   . Obstructive sleep apnea   . OCD (obsessive compulsive disorder)   . Pain in joint, site unspecified   . Pure hypercholesterolemia   . Scoliosis   . Situational stress   . Spastic colon   . Tubular adenoma   . Uterine cancer [facility])     Past Surgical History:  Procedure Laterality Date  . BREAST LUMPECTOMY     knot removed  . CHOLECYSTECTOMY    . HYSTEROTOMY    . TONSILLECTOMY    . tumor in left forearm      There were no vitals filed for this visit.  Subjective Assessment - 01/31/20 1455    Subjective  I was very sick for 2 days.  I still have very bad diarrhea    Patient Stated Goals  have more regular BM    Currently in Pain?  Yes    Pain Score  4     Pain Location  Back    Pain Orientation  Lower    Pain Descriptors / Indicators  Sore    Pain Type  Chronic  pain    Pain Onset  More than a month ago    Pain Frequency  Intermittent    Multiple Pain Sites  No                       [facility] Adult PT Treatment/Exercise - 01/31/20 0001      Self-Care   Other Self-Care Comments   toileting technicques educated and simulated in chair      Neuro Re-ed    Neuro Re-ed Details   engaged core and pelvic floor with verbal cues and using "s" - had no low back pain when using "s" technique      Exercises   Exercises  Lumbar      Lumbar Exercises: Aerobic   Nustep  L1 x 9 min - PT present to assess       Lumbar Exercises: Seated   Long Arc Quad on Chair  Strengthening;Both;20 reps    Other Seated Lumbar Exercises  marching - 20x each    Other Seated  Lumbar Exercises  bicep curl 2 lb - 20             PT Education - 01/31/20 1528    Education Details  [email or web address]    Person(s) Educated  Patient    Methods  Explanation;Demonstration;Handout;Verbal cues    Comprehension  Verbalized understanding;Returned demonstration       PT Short Term Goals - 01/09/20 1213      PT SHORT TERM GOAL #1   Title  Pt will have understanding of toileting techniques and implement at home for improved BM    Time  4    Period  Weeks    Status  New    Target Date  01/31/20      PT SHORT TERM GOAL #2   Title  pt will be ind with intial HEP    Time  4    Period  Weeks    Status  New    Target Date  01/31/20      PT SHORT TERM GOAL #3   Title  Pt will understand how to use vaginal dilators    Time  4    Period  Weeks    Status  New    Target Date  01/31/20        PT Long Term Goals - 01/09/20 1212      PT LONG TERM GOAL #1   Title  Pt will be ind with advanced HEP    Time  12    Period  Weeks    Status  New    Target Date  03/27/20      PT LONG TERM GOAL #2   Title  Pt will have 50% less pain    Time  12    Period  Weeks    Status  New    Target Date  03/27/20      PT LONG TERM GOAL #3   Title  Pt will report 50% more ease  with bowel movements due to improved muscle coordination    Time  12    Period  Weeks    Status  New    Target Date  03/27/20      PT LONG TERM GOAL #4   Title  Pt will have improved LE strength of at least 4/5 hip abduction for improved safety during functional activities    Time  12    Period  Weeks    Status  New    Target Date  03/27/20            Plan - 01/31/20 1707    Clinical Impression Statement  Pt did well with exercises today.  She tolerated more activities and got relief from back pain when cued to activate the TrA.  Pt will continue to benefit from skilled PT to achieve functional goals.    PT Treatment/Interventions  ADLs/Self Care Home Management;Biofeedback;Cryotherapy;Electrical Stimulation;Moist Heat;Neuromuscular re-education;Therapeutic exercise;Therapeutic activities;Patient/family education;Manual techniques;Passive range of motion;Dry needling;Taping    PT Next Visit Plan  biofeedback, f/u on dilator use, continue core strength    PT Home Exercise Plan  Access Code: [email or web address]    Consulted and Agree with Plan of Care  Patient       Patient will benefit from skilled therapeutic intervention in order to improve the following deficits and impairments:  Abnormal gait, Cardiopulmonary status limiting activity, Decreased activity tolerance, Decreased strength, Postural dysfunction, Pain, Impaired tone, Increased fascial restricitons, Decreased range of motion  Visit Diagnosis: Cramp and spasm  Muscle weakness (generalized)     Problem List Patient Active Problem List   Diagnosis Date Noted  . Chronic respiratory failure with hypoxia ([place]) 12/15/2019  . Orthostatic hypotension 12/15/2019  . Numbness 12/15/2019  . Chemotherapy-induced neutropenia ([place])   . Thrombocytopenia ([place]) 10/17/2019  . 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 Routine general medical examination at a health care facility 11/19/2016  . 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 Morbid obesity ([place])  06/26/2014  . COPD GOLD II    . Hyperlipidemia   . Arthritis 01/16/2014  . Depression 01/13/2014  . Hypothyroidism 01/13/2014  . GERD (gastroesophageal reflux disease) 01/13/2014  . OCD (obsessive compulsive disorder) 01/13/2014  . Fibromyalgia 01/13/2014    Jule Ser, PT 01/31/2020, 5:10 PM  [facility] [address], [place], [place], [address] Phone: [phone]   Fax:  [phone]  Name: Joanne Klein MRN: [id number] Date of Birth: December 05, 1946

## 2020-01-31 NOTE — Patient Instructions (Addendum)
Toileting Techniques for Bowel Movements (Defecation) Using your belly (abdomen) and pelvic floor muscles to have a bowel movement is usually instinctive.  Sometimes people can have problems with these muscles and have to relearn proper defecation (emptying) techniques.  If you have weakness in your muscles, organs that are falling out, decreased sensation in your pelvis, or ignore your urge to go, you may find yourself straining to have a bowel movement.  You are straining if you are: . holding your breath or taking in a huge gulp of air and holding it  . keeping your lips and jaw tensed and closed tightly . turning red in the face because of excessive pushing or forcing . developing or worsening your  hemorrhoids . getting faint while pushing . not emptying completely and have to defecate many times a day  If you are straining, you are actually making it harder for yourself to have a bowel movement.  Many people find they are pulling up with the pelvic floor muscles and closing off instead of opening the anus. Due to lack pelvic floor relaxation and coordination the abdominal muscles, one has to work harder to push the feces out.  Many people have never been taught how to defecate efficiently and effectively.  Notice what happens to your body when you are having a bowel movement.  While you are sitting on the toilet pay attention to the following areas: . Jaw and mouth position . Angle of your hips   . Whether your feet touch the ground or not . Arm placement  . Spine position . Waist . Belly tension . Anus (opening of the anal canal)  An Evacuation/Defecation Plan   Here are the 4 basic points:  1. Lean forward enough for your elbows to rest on your knees 2. Support your feet on the floor or use a low stool if your feet don't touch the floor  3. Push out your belly as if you have swallowed a beach ball-you should feel a widening of your waist 4. Open and relax your pelvic floor muscles,  rather than tightening around the anus      The following conditions my require modifications to your toileting posture:  . If you have had surgery in the past that limits your back, hip, pelvic, knee or ankle flexibility . Constipation   Your healthcare practitioner may make the following additional suggestions and adjustments:  1) Sit on the toilet  a) Make sure your feet are supported. b) Notice your hip angle and spine position-most people find it effective to lean forward or raise their knees, which can help the muscles around the anus to relax  c) When you lean forward, place your forearms on your thighs for support  2) Relax suggestions a) Breath deeply in through your nose and out slowly through your mouth as if you are smelling the flowers and blowing out the candles. b) To become aware of how to relax your muscles, contracting and releasing muscles can be helpful.  Pull your pelvic floor muscles in tightly by using the image of holding back gas, or closing around the anus (visualize making a circle smaller) and lifting the anus up and in.  Then release the muscles and your anus should drop down and feel open. Repeat 5 times ending with the feeling of relaxation. c) Keep your pelvic floor muscles relaxed; let your belly bulge out. d) The digestive tract starts at the mouth and ends at the anal opening, so be   sure to relax both ends of the tube.  Place your tongue on the roof of your mouth with your teeth separated.  This helps relax your mouth and will help to relax the anus at the same time.  3) Empty (defecation) a) Keep your pelvic floor and sphincter relaxed, then bulge your anal muscles.  Make the anal opening wide.  b) Stick your belly out as if you have swallowed a beach ball. c) Make your belly wall hard using your belly muscles while continuing to breathe. Doing this makes it easier to open your anus. d) Breath out and give a grunt (or try using other sounds  such as ahhhh, shhhhh, ohhhh or grrrrrrr).  4) Finish a) As you finish your bowel movement, pull the pelvic floor muscles up and in.  This will leave your anus in the proper place rather than remaining pushed out and down. If you leave your anus pushed out and down, it will start to feel as though that is normal and give you incorrect signals about needing to have a bowel movement.   [facility] [place], [place] [address] Access Code: [email or web address]  URL: [email or web address]  Date: 01/31/2020  Prepared by: Jari Favre   Exercises Supine Figure 4 Piriformis Stretch - 3 reps - 1 sets - 30 sec hold - 1x daily - 7x weekly Seated Hamstring Stretch - 3 reps - 1 sets - 30 sec hold - 1x daily - 7x weekly Seated Diaphragmatic Breathing - 10 reps - 1 sets - 1x daily - 7x weekly Seated Long Arc Quad - 10 reps - 3 sets - 1x daily - 7x weekly Seated March - 10 reps - 3 sets - 1x daily - 7x weekly Seated Bicep Curls Neutral with Dumbbells - 10 reps - 2 sets - 1x daily - 7x weekly

## 2020-02-06 ENCOUNTER — Other Ambulatory Visit: Payer: Self-pay | Admitting: Internal Medicine

## 2020-02-07 ENCOUNTER — Ambulatory Visit: Payer: Medicare Other | Admitting: Physical Therapy

## 2020-02-08 NOTE — Progress Notes (Signed)
  Radiation Oncology         [phone] ________________________________  Name: Joanne Klein MRN: [id number]  Date: 11/24/2019  DOB: Jun 20, 1946  End of Treatment Note  Diagnosis: anal cancer     Indication for treatment:  curative       Radiation treatment dates:   10/03/19 - 11/24/19  Site/dose:   The patient was treated with a course of IMRT using a simultaneous integrated boost technique. Daily image guidance was using during the treatment. The high dose region received a total of 54 Gy.  Narrative: The patient tolerated radiation treatment relatively well.   The patient experienced skin irritation as expected by the end of treatment.   Plan: The patient has completed radiation treatment. The patient will return to radiation oncology clinic for routine followup in one month. I advised the patient to call or return sooner if they have any questions or concerns related to their recovery or treatment. ________________________________  Jodelle Gross, M.D., Ph.D.

## 2020-02-10 ENCOUNTER — Ambulatory Visit (HOSPITAL_COMMUNITY)
Admission: RE | Admit: 2020-02-10 | Discharge: 2020-02-10 | Disposition: A | Discharge status: Home / Self Care | Payer: Medicare Other | Source: Ambulatory Visit | Attending: Nurse Practitioner | Admitting: Nurse Practitioner

## 2020-02-10 DIAGNOSIS — C210 Malignant neoplasm of anus, unspecified: Secondary | ICD-10-CM | POA: Diagnosis not present

## 2020-02-10 DIAGNOSIS — E041 Nontoxic single thyroid nodule: Secondary | ICD-10-CM | POA: Diagnosis not present

## 2020-02-11 ENCOUNTER — Other Ambulatory Visit: Payer: Self-pay | Admitting: Internal Medicine

## 2020-02-14 ENCOUNTER — Encounter: Payer: Self-pay | Admitting: Physical Therapy

## 2020-02-14 ENCOUNTER — Other Ambulatory Visit: Payer: Self-pay

## 2020-02-14 ENCOUNTER — Ambulatory Visit: Payer: Medicare Other | Admitting: Physical Therapy

## 2020-02-14 DIAGNOSIS — R252 Cramp and spasm: Secondary | ICD-10-CM

## 2020-02-14 DIAGNOSIS — M6281 Muscle weakness (generalized): Secondary | ICD-10-CM

## 2020-02-14 DIAGNOSIS — J449 Chronic obstructive pulmonary disease, unspecified: Secondary | ICD-10-CM | POA: Diagnosis not present

## 2020-02-14 NOTE — Patient Instructions (Signed)
Access Code: [email or web address]  URL: [email or web address]  Date: 02/14/2020  Prepared by: Jari Favre   Exercises Supine Figure 4 Piriformis Stretch - 3 reps - 1 sets - 30 sec hold - 1x daily - 7x weekly Seated Hamstring Stretch - 3 reps - 1 sets - 30 sec hold - 1x daily - 7x weekly Seated Diaphragmatic Breathing - 10 reps - 1 sets - 1x daily - 7x weekly Seated Long Arc Quad - 10 reps - 3 sets - 1x daily - 7x weekly Seated March - 10 reps - 3 sets - 1x daily - 7x weekly Seated Bicep Curls Neutral with Dumbbells - 10 reps - 2 sets - 1x daily - 7x weekly Seated Pelvic Floor Contraction with Isometric Hip Adduction - 10 reps - 1 sets - 4 sechold, 3 sec rest hold - 3x daily - 7x weekly Sit to stand pelvic - blow as you go - 10 reps - 1 sets - 3x daily - 7x weekly

## 2020-02-14 NOTE — Therapy (Signed)
[facility] [address], [place], [place], [address] Phone: [phone]   Fax:  [phone]  Physical Therapy Treatment  Patient Details  Name: Joanne Klein MRN: [id number] Date of Birth: 1946/04/21 Referring Provider (PT): Hayden Pedro, [place]   Encounter Date: 02/14/2020  PT End of Session - 02/14/20 1526    Visit Number  4    Number of Visits  10    Date for PT Re-Evaluation  03/27/20    Authorization Type  UHC    PT Start Time  4536    PT Stop Time  1523    PT Time Calculation (min)  38 min    Activity Tolerance  Patient tolerated treatment well    Behavior During Therapy  [facility] for tasks assessed/performed       Past Medical History:  Diagnosis Date  . Allergic rhinitis   . Arthritis   . Bursitis    Both Shoulders  . Chronic low back pain   . Degenerative disk disease   . Depression   . Diverticulitis   . Emphysema of lung ([place])   . GERD (gastroesophageal reflux disease)   . History of fibromyalgia   . Hypertension   . Hypothyroidism   . Irritable bowel syndrome   . Neuralgia, post-herpetic   . Obesity, unspecified   . Obstructive sleep apnea   . OCD (obsessive compulsive disorder)   . Pain in joint, site unspecified   . Pure hypercholesterolemia   . Scoliosis   . Situational stress   . Spastic colon   . Tubular adenoma   . Uterine cancer [facility])     Past Surgical History:  Procedure Laterality Date  . BREAST LUMPECTOMY     knot removed  . CHOLECYSTECTOMY    . HYSTEROTOMY    . TONSILLECTOMY    . tumor in left forearm      There were no vitals filed for this visit.  Subjective Assessment - 02/14/20 1447    Subjective  The tips on BM were very helpful and I didn't have to go back and forth to the bathroom. Denies pain.  I have to get a test for my thyroid because they saw something there.    Pertinent History  hx of anal cancer, radiation completed 11/23/2020    Patient Stated  Goals  have more regular BM    Currently in Pain?  No/denies                       [facility] Adult PT Treatment/Exercise - 02/14/20 0001      Neuro Re-ed    Neuro Re-ed Details   biofeedback used throughout treatment      Lumbar Exercises: Seated   Sit to Stand  5 reps   with kegel   Other Seated Lumbar Exercises  LAQ with ball squeeze and kegel - 10x each side    Other Seated Lumbar Exercises  ball squeezes in seated - 10x 4 sec hold      Lumbar Exercises: Supine   Other Supine Lumbar Exercises  kegel in hooklying; kegel with ball squeeze - educated and performed using biofeedback - resting about 4m; contract about 8-9 mV - 10x             PT Education - 02/14/20 1525    Education Details  Access Code: [email or web address]    Person(s) Educated  Patient    Methods  Explanation;Demonstration;Verbal cues;Handout  Comprehension  Verbalized understanding;Returned demonstration       PT Short Term Goals - 02/14/20 1509      PT SHORT TERM GOAL #1   Title  Pt will have understanding of toileting techniques and implement at home for improved BM    Status  Achieved      PT SHORT TERM GOAL #2   Title  pt will be ind with intial HEP    Status  Achieved      PT SHORT TERM GOAL #3   Title  Pt will understand how to use vaginal dilators    Status  Achieved        PT Long Term Goals - 01/09/20 1212      PT LONG TERM GOAL #1   Title  Pt will be ind with advanced HEP    Time  12    Period  Weeks    Status  New    Target Date  03/27/20      PT LONG TERM GOAL #2   Title  Pt will have 50% less pain    Time  12    Period  Weeks    Status  New    Target Date  03/27/20      PT LONG TERM GOAL #3   Title  Pt will report 50% more ease with bowel movements due to improved muscle coordination    Time  12    Period  Weeks    Status  New    Target Date  03/27/20      PT LONG TERM GOAL #4   Title  Pt will have improved LE strength of at least 4/5 hip abduction for  improved safety during functional activities    Time  12    Period  Weeks    Status  New    Target Date  03/27/20            Plan - 02/14/20 1527    Clinical Impression Statement  Pt did well with exercise progression and added pelvic floor exercises.  Educated and visual cues with biofeedback provided for all exercises.  She is able to correctly engage pelvic floor with cues and co-contraction techniques.  pt has ability to hold contraction for 4 sec and needs extra time to relax.  pt met all short term goals and is doing much better with complete bowel movements.  Pt will benefit from skilled PT to continue to progress and reach all functional goals.    PT Treatment/Interventions  ADLs/Self Care Home Management;Biofeedback;Cryotherapy;Electrical Stimulation;Moist Heat;Neuromuscular re-education;Therapeutic exercise;Therapeutic activities;Patient/family education;Manual techniques;Passive range of motion;Dry needling;Taping    PT Next Visit Plan  core and pelvic floor strength; standing exercises and porgress endurance as able; maybe starting with nustep    PT Home Exercise Plan  Access Code: [email or web address]    Consulted and Agree with Plan of Care  Patient       Patient will benefit from skilled therapeutic intervention in order to improve the following deficits and impairments:  Abnormal gait, Cardiopulmonary status limiting activity, Decreased activity tolerance, Decreased strength, Postural dysfunction, Pain, Impaired tone, Increased fascial restricitons, Decreased range of motion  Visit Diagnosis: Cramp and spasm  Muscle weakness (generalized)     Problem List Patient Active Problem List   Diagnosis Date Noted  . Chronic respiratory failure with hypoxia ([place]) 12/15/2019  . Orthostatic hypotension 12/15/2019  . Numbness 12/15/2019  . Chemotherapy-induced neutropenia ([place])   . Thrombocytopenia ([place]) 10/17/2019  . Anal  cancer ([place]) 09/15/2019  . Routine general medical  examination at a health care facility 11/19/2016  . 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 Morbid obesity ([place]) 06/26/2014  . COPD GOLD II    . Hyperlipidemia   . Arthritis 01/16/2014  . Depression 01/13/2014  . Hypothyroidism 01/13/2014  . GERD (gastroesophageal reflux disease) 01/13/2014  . OCD (obsessive compulsive disorder) 01/13/2014  . Fibromyalgia 01/13/2014    Jule Ser, PT 02/14/2020, 3:34 PM  [facility] [address] [place], [place], [address] Phone: [phone]   Fax:  [phone]  Name: Joanne Klein MRN: [id number] Date of Birth: [birth year]

## 2020-02-16 DIAGNOSIS — G5603 Carpal tunnel syndrome, bilateral upper limbs: Secondary | ICD-10-CM | POA: Diagnosis not present

## 2020-02-16 DIAGNOSIS — G5622 Lesion of ulnar nerve, left upper limb: Secondary | ICD-10-CM | POA: Diagnosis not present

## 2020-02-17 ENCOUNTER — Other Ambulatory Visit: Payer: Self-pay | Admitting: Nurse Practitioner

## 2020-02-17 ENCOUNTER — Telehealth: Payer: Self-pay | Admitting: *Deleted

## 2020-02-17 DIAGNOSIS — C210 Malignant neoplasm of anus, unspecified: Secondary | ICD-10-CM

## 2020-02-17 DIAGNOSIS — E041 Nontoxic single thyroid nodule: Secondary | ICD-10-CM

## 2020-02-17 NOTE — Telephone Encounter (Signed)
Notified of message below

## 2020-02-17 NOTE — Telephone Encounter (Signed)
-----   Message from Owens Shark, NP sent at 02/17/2020  8:37 AM EST ----- Please let her know we are referring her for biopsy of the thyroid nodule.

## 2020-02-17 NOTE — Telephone Encounter (Signed)
LM to call Dr Sherrill's office 

## 2020-02-20 ENCOUNTER — Ambulatory Visit: Payer: Medicare Other | Attending: Internal Medicine

## 2020-02-20 DIAGNOSIS — Z23 Encounter for immunization: Secondary | ICD-10-CM

## 2020-02-20 NOTE — Progress Notes (Signed)
   Covid-19 Vaccination Clinic  Name:  [name]    MRN: [id number] DOB: 1946/12/17  02/20/2020  [name] was observed post Covid-19 immunization for 30 minutes based on pre-vaccination screening without incidence. She was provided with Vaccine Information Sheet and instruction to access the V-Safe system.   [name] was instructed to call 911 with any severe reactions post vaccine: Marland Kitchen Difficulty breathing  . Swelling of your face and throat  . A fast heartbeat  . A bad rash all over your body  . Dizziness and weakness    Immunizations Administered    Name Date Dose VIS Date Route   Pfizer COVID-19 Vaccine 02/20/2020 12:36 PM 0.3 mL 12/02/2019 Intramuscular   Manufacturer: [place]   Lot: [id number]   [place]: [id number]

## 2020-02-21 ENCOUNTER — Other Ambulatory Visit: Payer: Self-pay

## 2020-02-21 ENCOUNTER — Ambulatory Visit: Payer: Medicare Other | Attending: Radiation Oncology | Admitting: Physical Therapy

## 2020-02-21 DIAGNOSIS — R252 Cramp and spasm: Secondary | ICD-10-CM

## 2020-02-21 DIAGNOSIS — M6281 Muscle weakness (generalized): Secondary | ICD-10-CM | POA: Diagnosis not present

## 2020-02-21 NOTE — Therapy (Signed)
[facility] [address] [place], [place], [address] Phone: [phone]   Fax:  [phone]  Physical Therapy Treatment  Patient Details  Name: Joanne Klein MRN: [id number] Date of Birth: 08-30-1946 Referring Provider (PT): Hayden Pedro, [place]   Encounter Date: 02/21/2020  PT End of Session - 02/21/20 1445    Visit Number  5    Number of Visits  10    Date for PT Re-Evaluation  03/27/20    Authorization Type  UHC    PT Start Time  1440    PT Stop Time  1450    PT Time Calculation (min)  10 min    Activity Tolerance  Patient tolerated treatment well;Patient limited by fatigue       Past Medical History:  Diagnosis Date  . Allergic rhinitis   . Arthritis   . Bursitis    Both Shoulders  . Chronic low back pain   . Degenerative disk disease   . Depression   . Diverticulitis   . Emphysema of lung ([place])   . GERD (gastroesophageal reflux disease)   . History of fibromyalgia   . Hypertension   . Hypothyroidism   . Irritable bowel syndrome   . Neuralgia, post-herpetic   . Obesity, unspecified   . Obstructive sleep apnea   . OCD (obsessive compulsive disorder)   . Pain in joint, site unspecified   . Pure hypercholesterolemia   . Scoliosis   . Situational stress   . Spastic colon   . Tubular adenoma   . Uterine cancer [facility])     Past Surgical History:  Procedure Laterality Date  . BREAST LUMPECTOMY     knot removed  . CHOLECYSTECTOMY    . HYSTEROTOMY    . TONSILLECTOMY    . tumor in left forearm      There were no vitals filed for this visit.  Subjective Assessment - 02/21/20 1455    Subjective  Pt came to clinic feeling very nauseus today.  Pt wants to find out what to do at home and make today last visit. "it's going to be a short visit"    Pertinent History  hx of anal cancer, radiation completed 11/23/2020    Currently in Pain?  Yes   did not give a number just very nauseus   Pain  Location  Abdomen    Multiple Pain Sites  No                       [facility] Adult PT Treatment/Exercise - 02/21/20 0001      Self-Care   Other Self-Care Comments   reviewed the importance of conitinued exercises especially stretching due to radiation causing long term effects for 7+ years; reviewed current exercise plan and encouraged patient to return to PT when she is ready to focus more on strength if needed for her lifestyle activities             PT Education - 02/21/20 1500    Education Details  reviewed current [email or web address]    Person(s) Educated  Patient    Methods  Explanation    Comprehension  Verbalized understanding       PT Short Term Goals - 02/14/20 1509      PT SHORT TERM GOAL #1   Title  Pt will have understanding of toileting techniques and implement at home for improved BM    Status  Achieved  PT SHORT TERM GOAL #2   Title  pt will be ind with intial HEP    Status  Achieved      PT SHORT TERM GOAL #3   Title  Pt will understand how to use vaginal dilators    Status  Achieved        PT Long Term Goals - 02/21/20 1458      PT LONG TERM GOAL #1   Title  Pt will be ind with advanced HEP    Status  Achieved      PT LONG TERM GOAL #2   Title  Pt will have 50% less pain    Status  Deferred      PT LONG TERM GOAL #3   Title  Pt will report 50% more ease with bowel movements due to improved muscle coordination    Baseline  imporved a lot with toileting techniques    Status  Partially Met      PT LONG TERM GOAL #4   Title  Pt will have improved LE strength of at least 4/5 hip abduction for improved safety during functional activities    Status  Not Met            Plan - 02/21/20 1452    Clinical Impression Statement  Today we reviewed the HEP and encouraged patient to return to work on strengthening when she is ready.  Pt does not appear to be able to focus on her PT as much right now due to feeling fatigue and thyroid issues.   Pt was educated on exercise being able to help with energy levels and to keep doing HEP for the foreseeable future.    PT Treatment/Interventions  ADLs/Self Care Home Management;Biofeedback;Cryotherapy;Electrical Stimulation;Moist Heat;Neuromuscular re-education;Therapeutic exercise;Therapeutic activities;Patient/family education;Manual techniques;Passive range of motion;Dry needling;Taping    PT Next Visit Plan  discharge today    PT Home Exercise Plan  Access Code: [email or web address]    Consulted and Agree with Plan of Care  Patient       Patient will benefit from skilled therapeutic intervention in order to improve the following deficits and impairments:  Abnormal gait, Cardiopulmonary status limiting activity, Decreased activity tolerance, Decreased strength, Postural dysfunction, Pain, Impaired tone, Increased fascial restricitons, Decreased range of motion  Visit Diagnosis: Cramp and spasm  Muscle weakness (generalized)     Problem List Patient Active Problem List   Diagnosis Date Noted  . Chronic respiratory failure with hypoxia ([place]) 12/15/2019  . Orthostatic hypotension 12/15/2019  . Numbness 12/15/2019  . Chemotherapy-induced neutropenia ([place])   . Thrombocytopenia ([place]) 10/17/2019  . Anal cancer ([place]) 09/15/2019  . Routine general medical examination at a health care facility 11/19/2016  . 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 Morbid obesity ([place]) 06/26/2014  . COPD GOLD II    . Hyperlipidemia   . Arthritis 01/16/2014  . Depression 01/13/2014  . Hypothyroidism 01/13/2014  . GERD (gastroesophageal reflux disease) 01/13/2014  . OCD (obsessive compulsive disorder) 01/13/2014  . Fibromyalgia 01/13/2014    Jule Ser, PT 02/21/2020, 3:04 PM  [facility] [address] [place], [place], [address] Phone: [phone]   Fax:  [phone]  Name: Joanne Klein MRN: [id number] Date of Birth: 1946-04-18  PHYSICAL THERAPY  DISCHARGE SUMMARY  Visits from Start of Care: 5  Current functional level related to goals / functional outcomes: See above goals   Remaining deficits: See above   Education / Equipment: HEP  Plan: Patient agrees to discharge.  Patient goals were partially met. Patient is being discharged due to being pleased with the current functional level.  ?????     [employer], PT 02/21/20 3:06 PM

## 2020-02-21 NOTE — Patient Instructions (Signed)
Access Code: [email or web address]  URL: [email or web address]  Date: 02/21/2020  Prepared by: Jari Favre   Exercises Supine Figure 4 Piriformis Stretch - 3 reps - 1 sets - 30 sec hold - 1x daily - 7x weekly Seated Hamstring Stretch - 3 reps - 1 sets - 30 sec hold - 1x daily - 7x weekly Seated Diaphragmatic Breathing - 10 reps - 1 sets - 1x daily - 7x weekly Seated Long Arc Quad - 10 reps - 3 sets - 1x daily - 7x weekly Seated March - 10 reps - 3 sets - 1x daily - 7x weekly Seated Bicep Curls Neutral with Dumbbells - 10 reps - 2 sets - 1x daily - 7x weekly Seated Pelvic Floor Contraction with Isometric Hip Adduction - 10 reps - 1 sets - 4 sechold, 3 sec rest hold - 3x daily - 7x weekly Sit to stand pelvic - blow as you go - 10 reps - 1 sets - 3x daily - 7x weekly

## 2020-02-23 ENCOUNTER — Other Ambulatory Visit (HOSPITAL_COMMUNITY)
Admission: RE | Admit: 2020-02-23 | Discharge: 2020-02-23 | Disposition: A | Discharge status: Home / Self Care | Payer: Medicare Other | Source: Ambulatory Visit | Attending: Nurse Practitioner | Admitting: Nurse Practitioner

## 2020-02-23 ENCOUNTER — Ambulatory Visit
Admission: RE | Admit: 2020-02-23 | Discharge: 2020-02-23 | Disposition: A | Discharge status: Home / Self Care | Payer: Medicare Other | Source: Ambulatory Visit | Attending: Nurse Practitioner | Admitting: Nurse Practitioner

## 2020-02-23 DIAGNOSIS — C210 Malignant neoplasm of anus, unspecified: Secondary | ICD-10-CM | POA: Insufficient documentation

## 2020-02-23 DIAGNOSIS — E041 Nontoxic single thyroid nodule: Secondary | ICD-10-CM

## 2020-02-24 ENCOUNTER — Encounter: Payer: Self-pay | Admitting: *Deleted

## 2020-02-24 DIAGNOSIS — J449 Chronic obstructive pulmonary disease, unspecified: Secondary | ICD-10-CM | POA: Diagnosis not present

## 2020-02-24 LAB — CYTOLOGY - NON PAP

## 2020-02-24 NOTE — Progress Notes (Signed)
Patient reports no longer needing oxygen and wants equipment removed from home. [employer] will not remove equipment without MD order. MD signed order to discontinue oxygen and faxed to [employer] at [phone]

## 2020-02-28 ENCOUNTER — Encounter: Payer: Medicare Other | Admitting: Physical Therapy

## 2020-02-28 ENCOUNTER — Telehealth: Payer: Self-pay | Admitting: *Deleted

## 2020-02-28 NOTE — Telephone Encounter (Signed)
-----   Message from Ladell Pier, MD sent at 02/27/2020  7:25 PM EST ----- Please call Joanne Klein, pathology from thyroid biopsy consistent with a benign nodule, f/u as scheduled

## 2020-02-28 NOTE — Telephone Encounter (Signed)
Notified of thyroid biopsy results--no cancer, benign nodule.

## 2020-03-07 DIAGNOSIS — M542 Cervicalgia: Secondary | ICD-10-CM | POA: Diagnosis not present

## 2020-03-07 DIAGNOSIS — M5412 Radiculopathy, cervical region: Secondary | ICD-10-CM | POA: Diagnosis not present

## 2020-03-08 DIAGNOSIS — G5622 Lesion of ulnar nerve, left upper limb: Secondary | ICD-10-CM | POA: Diagnosis not present

## 2020-03-08 DIAGNOSIS — I10 Essential (primary) hypertension: Secondary | ICD-10-CM | POA: Diagnosis not present

## 2020-03-08 DIAGNOSIS — G5603 Carpal tunnel syndrome, bilateral upper limbs: Secondary | ICD-10-CM | POA: Diagnosis not present

## 2020-03-08 DIAGNOSIS — M5412 Radiculopathy, cervical region: Secondary | ICD-10-CM | POA: Diagnosis not present

## 2020-03-13 ENCOUNTER — Telehealth: Payer: Self-pay | Admitting: Internal Medicine

## 2020-03-13 DIAGNOSIS — J449 Chronic obstructive pulmonary disease, unspecified: Secondary | ICD-10-CM

## 2020-03-13 NOTE — Progress Notes (Signed)
  Chronic Care Management   Note  03/13/2020 Name: RENE PAIGE MRN: [id number] DOB: 26-Dec-1945  SAKIA BRIZUELA is a 74 y.o. year old female who is a primary care patient of Hoyt Koch, MD. I reached out to [employer] by phone today in response to a referral sent by Ms. Friedens PCP, Hoyt Koch, MD.   Ms. Mcbeth was given information about Chronic Care Management services today including:  1. CCM service includes personalized support from designated clinical staff supervised by her physician, including individualized plan of care and coordination with other care providers 2. 24/7 contact phone numbers for assistance for urgent and routine care needs. 3. Service will only be billed when office clinical staff spend 20 minutes or more in a month to coordinate care. 4. Only one practitioner may furnish and bill the service in a calendar month. 5. The patient may stop CCM services at any time (effective at the end of the month) by phone call to the office staff.   Patient agreed to services and verbal consent obtained.   Follow up plan:   Raynicia Dukes [employer] Scheduler

## 2020-03-20 ENCOUNTER — Ambulatory Visit: Payer: Medicare Other | Attending: Internal Medicine

## 2020-03-20 DIAGNOSIS — Z23 Encounter for immunization: Secondary | ICD-10-CM

## 2020-03-20 NOTE — Progress Notes (Signed)
   Covid-19 Vaccination Clinic  Name:  Joanne Klein    MRN: [id number] DOB: 12/29/1945  03/20/2020  [name] was observed post Covid-19 immunization for 15 minutes without incident. She was provided with Vaccine Information Sheet and instruction to access the V-Safe system.   [name] was instructed to call 911 with any severe reactions post vaccine: Marland Kitchen Difficulty breathing  . Swelling of face and throat  . A fast heartbeat  . A bad rash all over body  . Dizziness and weakness   Immunizations Administered    Name Date Dose VIS Date Route   Pfizer COVID-19 Vaccine 03/20/2020  4:42 PM 0.3 mL 12/02/2019 Intramuscular   Manufacturer: [place]   Lot: [id number]   [place]: [id number]

## 2020-04-02 NOTE — Addendum Note (Signed)
Addended by: Aviva Signs M on: 04/02/2020 10:07 AM   Modules accepted: Orders

## 2020-04-05 ENCOUNTER — Ambulatory Visit: Payer: Medicare Other | Admitting: Pharmacist

## 2020-04-05 ENCOUNTER — Other Ambulatory Visit: Payer: Self-pay

## 2020-04-05 DIAGNOSIS — E039 Hypothyroidism, unspecified: Secondary | ICD-10-CM

## 2020-04-05 DIAGNOSIS — F3341 Major depressive disorder, recurrent, in partial remission: Secondary | ICD-10-CM

## 2020-04-05 DIAGNOSIS — C210 Malignant neoplasm of anus, unspecified: Secondary | ICD-10-CM

## 2020-04-05 DIAGNOSIS — E785 Hyperlipidemia, unspecified: Secondary | ICD-10-CM

## 2020-04-05 NOTE — Chronic Care Management (AMB) (Signed)
Chronic Care Management Pharmacy  Name: Joanne Klein  MRN: [id number] DOB: 10/30/1946   Chief Complaint/ HPI  Joanne Klein,  74 y.o. , female presents for their Initial CCM visit with the clinical pharmacist via telephone due to COVID-19 Pandemic.  PCP : Hoyt Koch, MD  Their chronic conditions include:  GERD, depression/OCD, arthritis, HLD, hx cancer  Early May check up with surgeon about cancer.    Office Visits: 01/11/20 Dr Sharlet Salina OV: bowels improved, hydration improved. Use O2 prn for SOB and at night. Hand pain not improved w/ brace, Checked x-ray for pinched nerve, referred to neurology.  12/15/19 Dr Sharlet Salina OV: numbness in L hand. Recommended daily docusate due to chronic opioids - want to lessen imodium and miralax usage. Rec'd hand brace for hand pain.  Consult Visit: [facility]: PT monthly.  01/10/20 NP Marcello Moores (heme/onc): SCC anal. Thyroid nodule on PET scan, referred for thyroid US.   12/27/19 ED visit: abdominal pain/constipation. Found UTI, Rx'd Keflex.  12/13/19 Dr Benay Spice (oncology): finished radiation 3 wks prior. C/o dizziness, dyspnea. Rec'd increase fluid intake and supplements - Mg and KCl.   Medications: Outpatient Encounter Medications as of 04/05/2020  Medication Sig  . atorvastatin (LIPITOR) 20 MG tablet TAKE 1 TABLET BY MOUTH  EVERY OTHER DAY  . calcium carbonate (TUMS - DOSED IN MG ELEMENTAL CALCIUM) 500 MG chewable tablet Chew 1 tablet by mouth daily.  . Cholecalciferol (VITAMIN D-3) 1000 UNITS CAPS Take 1,000 Units by mouth daily.   Marland Kitchen levothyroxine (SYNTHROID) 75 MCG tablet TAKE 1 TABLET BY MOUTH  DAILY BEFORE BREAKFAST  . loperamide (IMODIUM) 2 MG capsule Take 1 capsule (2 mg total) by mouth as needed for diarrhea or loose stools.  Marland Kitchen omeprazole (PRILOSEC) 40 MG capsule TAKE 1 CAPSULE BY MOUTH  DAILY  . ondansetron (ZOFRAN) 8 MG tablet Take 8 mg by mouth every 8 (eight) hours as needed for nausea or vomiting.  Marland Kitchen  oxyCODONE-acetaminophen (PERCOCET) 10-325 MG tablet Take 1 tablet by mouth every 4 (four) hours as needed for pain. Gets from another provider @@ 180 per month  . Polyethylene Glycol 3350 (MIRALAX PO) Take 17 g by mouth daily as needed.  . prochlorperazine (COMPAZINE) 10 MG tablet Take 10 mg by mouth every 6 (six) hours as needed for nausea or vomiting.  . sertraline (ZOLOFT) 100 MG tablet TAKE 2 TABLETS BY MOUTH AT  BEDTIME  . phenylephrine-shark liver oil-mineral oil-petrolatum (PREPARATION H) 0.25-3-14-71.9 % rectal ointment Place 1 application rectally 2 (two) times daily as needed for hemorrhoids.  . potassium chloride SA (KLOR-CON) 20 MEQ tablet Take 1 tablet (20 mEq total) by mouth daily. (Patient not taking: Reported on 04/05/2020)   No facility-administered encounter medications on file as of 04/05/2020.     Current Diagnosis/Assessment:  SDOH Interventions     Most Recent Value  SDOH Interventions  SDOH Interventions for the Following Domains  Transportation  Transportation Interventions  Other (Comment) [no transportation needs]      Goals Addressed            This Visit's Progress   . Cancer       CARE PLAN ENTRY  Current Barriers:  . Chronic Disease Management support, education, and care coordination needs related to cancer  Pharmacist Clinical Goal(s):  Marland Kitchen Over the next 90 days patient will work with oncologist to improve symptoms  Interventions: . Comprehensive medication review performed. . Counseled on side effects of radiation vs medications she has taken  for several years o Frequent urination more likely related to radiation than medications  Patient Self Care Activities:  . Patient will discuss urination symptoms with oncologist  Initial goal documentation    . Cholesterol: LDL goal < 100       CARE PLAN ENTRY (see longitudinal plan of care for additional care plan information)  Current Barriers:  . Uncontrolled hyperlipidemia, complicated by  cancer, GERD . Current antihyperlipidemic regimen: atorvastatin 40 mg MWF . Previous antihyperlipidemic medications tried n/a . Most recent lipid panel:     Component Value Date/Time   CHOL 197 02/17/2019 1637   TRIG 138.0 02/17/2019 1637   HDL 50.10 02/17/2019 1637   CHOLHDL 4 02/17/2019 1637   VLDL 27.6 02/17/2019 1637   LDLCALC 119 (H) 02/17/2019 1637   . The 10-year ASCVD risk score Mikey Bussing DC Brooke Bonito., et al., 2013) is: 12.5%   Pharmacist Clinical Goal(s):  Marland Kitchen Over the next 90 days, patient will work with PharmD and providers towards optimized antihyperlipidemic therapy  Interventions: . Comprehensive medication review performed; medication list updated in electronic medical record.  Bertram Savin care team collaboration (see longitudinal plan of care) . Discussed benefits of statin for lowering cholesterol and preventing heart attack/stroke o Recommended to increase atorvastatin gradually up to daily use  Patient Self Care Activities:  . Patient will focus on medication adherence by pill box . Patient will increase atorvastatin to 4 days a week, then 5 days, etc as tolerated  Initial goal documentation     . Depression       CARE PLAN ENTRY  Current Barriers:  . Chronic Disease Management support, education, and care coordination needs related to  depression  Pharmacist Clinical Goal(s):  Marland Kitchen Over the next 90 days patient will work with pharmacist and PCP to improve depressive symptoms  Interventions: . Comprehensive medication review performed. . Discussed benefits of bupropion (Wellbutrin) as synergistic therapy with sertraline  Patient Self Care Activities:  . Self administers medications as prescribed, Calls pharmacy for medication refills, and Calls provider office for new concerns or questions . Patient will consider additional medication for depression  Initial goal documentation     . Hypothyoidism: goal TSH 0.35-4.5       CARE PLAN ENTRY  Current  Barriers:  . Chronic Disease Management support, education, and care coordination needs related to hypothyroidism  Pharmacist Clinical Goal(s):  Marland Kitchen Over the next 90 days, patient will continue to take medication as prescribed and monitor for symptoms of low or high thyroid  Interventions: . Comprehensive medication review performed. . Discussed symptoms of hypothyroidism including fatigue, hair loss, weight gain  Patient Self Care Activities:  . Patient will continue to take levothyroxine as prescribed  . Patient will let providers know if she experiences s/sx hypo- or hyperthyroidism  Initial goal documentation        Hyperlipidemia   Lipid Panel      Component Value Date/Time   CHOL 197 02/17/2019 1637   TRIG 138.0 02/17/2019 1637   HDL 50.10 02/17/2019 1637   CHOLHDL 4 02/17/2019 1637   VLDL 27.6 02/17/2019 1637   LDLCALC 119 (H) 02/17/2019 1637    The 10-year ASCVD risk score Mikey Bussing DC Jr., et al., 2013) is: 12.5%   Values used to calculate the score:     Age: 35 years     Sex: Female     Is Non-Hispanic African American: No     Diabetic: No     Tobacco smoker: No  Systolic Blood Pressure: [id number] mmHg     Is BP treated: No     HDL Cholesterol: 50.1 mg/dL     Total Cholesterol: 197 mg/dL   Patient has failed these meds in past: n/a Patient is currently controlled on the following medications: atorvastatin 20 mg QOD - MWF  We discussed:  diet and exercise extensively; pt is taking statin 3 times a week due to fear of side effects, she used to take daily and never had any issues. Discussed benefits of statin, pt agreed to gradually increase statin up to daily.  Plan  Recommended to increase atorvastatin to daily dosing   Hypothyroidism   TSH  Date Value Ref Range Status  10/18/2019 2.971 0.350 - 4.500 uIU/mL Final    Comment:    Performed by a 3rd Generation assay with a functional sensitivity of <=0.01 uIU/mL. Performed at [facility],  [place] [place], [place] [address]     Patient has failed these meds in past: n/a Patient is currently controlled on the following medications: levothyroxine 75 mcg qAM - 6:30am, takes with Prilosec, pain med  We discussed:  Symptoms of hypothyroidism, pt is very fatigued but also recovering from radiation. Plans to discuss with oncologist.  Plan  Continue current medications   GERD   Patient has failed these meds in past: n/a Patient is currently controlled on the following medications: omeprazole 40 mg daily   We discussed:  Had stopped taking it a few years back, had terrible GERD  Plan   Continue current medications   Depression/OCD   Patient has failed these meds in past:  Patient is currently controlled on the following medications: sertraline 200 mg (100 mg x 2) HS  We discussed:  Depressed since 74 y/o, reports she has tried most other antidepressants. She still struggle with depression, discussed benefits of adding something like bupropion. She took that in the past for smoking cessation and reports it didn't help, but may work syngergistically with sertraline. Pt wants to continue current therapy for now but will think about adding bupropion in the future.  Plan  Continue current medications    Hx anal cancer   Patient has failed these meds in past: n/a Patient is currently controlled on the following medications: prochlorperazine 10 mg q6h prn, ondanestron 8 mg q8h prn, loperamide 2 mg PRN, oxycodone-apap 10-325 mg q4h prn (#180/mo), preparation H  We discussed:  Takes Nausea meds every few weeks, takes compazine for severe nausea. Oxycodone 6 per day, been on for 4 years.   Plan  Continue current medications   Health Maintenance   Patient is currently controlled on the following medications: Miralax prn, Vitamin D 1000 IU,   We discussed:  Miralax helping  Plan  Continue current medications    Medication Management   Pt uses [facility]  and [facility] pharmacy for all medications Uses pill box Pt endorses 99% compliance  We discussed: all meds are $0 through mail order. Pt denies issues with pharmacies.    Plan  Continue current medication management strategy     Follow up: 3 month phone visit  Charlene Brooke, PharmD Clinical Pharmacist [facility] at [facility] [phone]

## 2020-04-05 NOTE — Patient Instructions (Addendum)
Visit Information  Thank you for meeting with me to discuss your medications! I look forward to working with you to achieve your health care goals. Below is a summary of what we talked about during the visit:  Goals Addressed            This Visit's Progress   . Cancer       CARE PLAN ENTRY  Current Barriers:  . Chronic Disease Management support, education, and care coordination needs related to cancer  Pharmacist Clinical Goal(s):  Joanne Klein Kitchen Over the next 90 days patient will work with oncologist to improve symptoms  Interventions: . Comprehensive medication review performed. . Counseled on side effects of radiation vs medications she has taken for several years o Frequent urination more likely related to radiation than medications  Patient Self Care Activities:  . Patient will discuss urination symptoms with oncologist  Initial goal documentation    . Cholesterol: LDL goal < 100       CARE PLAN ENTRY (see longitudinal plan of care for additional care plan information)  Current Barriers:  . Uncontrolled hyperlipidemia, complicated by cancer, GERD . Current antihyperlipidemic regimen: atorvastatin 40 mg MWF . Previous antihyperlipidemic medications tried n/a . Most recent lipid panel:     Component Value Date/Time   CHOL 197 02/17/2019 1637   TRIG 138.0 02/17/2019 1637   HDL 50.10 02/17/2019 1637   CHOLHDL 4 02/17/2019 1637   VLDL 27.6 02/17/2019 1637   LDLCALC 119 (H) 02/17/2019 1637   . The 10-year ASCVD risk score Mikey Bussing DC Brooke Bonito., et al., 2013) is: 12.5%   Pharmacist Clinical Goal(s):  Joanne Klein Kitchen Over the next 90 days, patient will work with PharmD and providers towards optimized antihyperlipidemic therapy  Interventions: . Comprehensive medication review performed; medication list updated in electronic medical record.  Bertram Savin care team collaboration (see longitudinal plan of care) . Discussed benefits of statin for lowering cholesterol and preventing heart  attack/stroke o Recommended to increase atorvastatin gradually up to daily use  Patient Self Care Activities:  . Patient will focus on medication adherence by pill box . Patient will increase atorvastatin to 4 days a week, then 5 days, etc as tolerated  Initial goal documentation     . Depression       CARE PLAN ENTRY  Current Barriers:  . Chronic Disease Management support, education, and care coordination needs related to  depression  Pharmacist Clinical Goal(s):  Joanne Klein Kitchen Over the next 90 days patient will work with pharmacist and PCP to improve depressive symptoms  Interventions: . Comprehensive medication review performed. . Discussed benefits of bupropion (Wellbutrin) as synergistic therapy with sertraline  Patient Self Care Activities:  . Self administers medications as prescribed, Calls pharmacy for medication refills, and Calls provider office for new concerns or questions . Patient will consider additional medication for depression  Initial goal documentation     . Hypothyoidism: goal TSH 0.35-4.5       CARE PLAN ENTRY  Current Barriers:  . Chronic Disease Management support, education, and care coordination needs related to hypothyroidism  Pharmacist Clinical Goal(s):  Joanne Klein Kitchen Over the next 90 days, patient will continue to take medication as prescribed and monitor for symptoms of low or high thyroid  Interventions: . Comprehensive medication review performed. . Discussed symptoms of hypothyroidism including fatigue, hair loss, weight gain  Patient Self Care Activities:  . Patient will continue to take levothyroxine as prescribed  . Patient will let providers know if she experiences s/sx hypo- or hyperthyroidism  Initial goal documentation        Joanne Klein was given information about Chronic Care Management services today including:  1. CCM service includes personalized support from designated clinical staff supervised by her physician, including individualized  plan of care and coordination with other care providers 2. 24/7 contact phone numbers for assistance for urgent and routine care needs. 3. Standard insurance, coinsurance, copays and deductibles apply for chronic care management only during months in which we provide at least 20 minutes of these services. Most insurances cover these services at 100%, however patients may be responsible for any copay, coinsurance and/or deductible if applicable. This service may help you avoid the need for more expensive face-to-face services. 4. Only one practitioner may furnish and bill the service in a calendar month. 5. The patient may stop CCM services at any time (effective at the end of the month) by phone call to the office staff.  Patient agreed to services and verbal consent obtained.   The patient verbalized understanding of instructions provided today and agreed to receive a mailed copy of patient instruction and/or educational materials. Telephone follow up appointment with pharmacy team member scheduled for: 3 months  Charlene Brooke, PharmD Clinical Pharmacist [facility] at [facility] [phone]     Cholesterol Content in Foods Cholesterol is a waxy, fat-like substance that helps to carry fat in the blood. The body needs cholesterol in small amounts, but too much cholesterol can cause damage to the arteries and heart. Most people should eat less than 200 milligrams (mg) of cholesterol a day. Foods with cholesterol  Cholesterol is found in animal-based foods, such as meat, seafood, and dairy. Generally, low-fat dairy and lean meats have less cholesterol than full-fat dairy and fatty meats. The milligrams of cholesterol per serving (mg per serving) of common cholesterol-containing foods are listed below. Meat and other proteins  Egg - one large whole egg has 186 mg.  Veal shank - 4 oz has 141 mg.  Lean ground [place] (93% lean) - 4 oz has 118 mg.  Fat-trimmed lamb loin - 4  oz has 106 mg.  Lean ground beef (90% lean) - 4 oz has 100 mg.  Lobster - 3.5 oz has 90 mg.  Pork loin chops - 4 oz has 86 mg.  Canned salmon - 3.5 oz has 83 mg.  Fat-trimmed beef top loin - 4 oz has 78 mg.  Frankfurter - 1 frank (3.5 oz) has 77 mg.  Crab - 3.5 oz has 71 mg.  Roasted chicken without skin, white meat - 4 oz has 66 mg.  Light bologna - 2 oz has 45 mg.  Deli-cut [place] - 2 oz has 31 mg.  Canned tuna - 3.5 oz has 31 mg.  Bacon - 1 oz has 29 mg.  Oysters and mussels (raw) - 3.5 oz has 25 mg.  Mackerel - 1 oz has 22 mg.  Trout - 1 oz has 20 mg.  Pork sausage - 1 link (1 oz) has 17 mg.  Salmon - 1 oz has 16 mg.  Tilapia - 1 oz has 14 mg. Dairy  Soft-serve ice cream -  cup (4 oz) has 103 mg.  Whole-milk yogurt - 1 cup (8 oz) has 29 mg.  Cheddar cheese - 1 oz has 28 mg.  American cheese - 1 oz has 28 mg.  Whole milk - 1 cup (8 oz) has 23 mg.  2% milk - 1 cup (8 oz) has 18 mg.  Cream cheese -  1 tablespoon (Tbsp) has 15 mg.  Cottage cheese -  cup (4 oz) has 14 mg.  Low-fat (1%) milk - 1 cup (8 oz) has 10 mg.  Sour cream - 1 Tbsp has 8.5 mg.  Low-fat yogurt - 1 cup (8 oz) has 8 mg.  Nonfat Greek yogurt - 1 cup (8 oz) has 7 mg.  Half-and-half cream - 1 Tbsp has 5 mg. Fats and oils  Cod liver oil - 1 tablespoon (Tbsp) has 82 mg.  Butter - 1 Tbsp has 15 mg.  Lard - 1 Tbsp has 14 mg.  Bacon grease - 1 Tbsp has 14 mg.  Mayonnaise - 1 Tbsp has 5-10 mg.  Margarine - 1 Tbsp has 3-10 mg. Exact amounts of cholesterol in these foods may vary depending on specific ingredients and brands. Foods without cholesterol Most plant-based foods do not have cholesterol unless you combine them with a food that has cholesterol. Foods without cholesterol include:  Grains and cereals.  Vegetables.  Fruits.  Vegetable oils, such as olive, canola, and sunflower oil.  Legumes, such as peas, beans, and lentils.  Nuts and seeds.  Egg  whites. Summary  The body needs cholesterol in small amounts, but too much cholesterol can cause damage to the arteries and heart.  Most people should eat less than 200 milligrams (mg) of cholesterol a day. This information is not intended to replace advice given to you by your health care provider. Make sure you discuss any questions you have with your health care provider. Document Revised: 11/20/2017 Document Reviewed: 08/04/2017 Elsevier Patient Education  [place].

## 2020-04-10 ENCOUNTER — Inpatient Hospital Stay: Payer: Medicare Other | Admitting: Oncology

## 2020-04-10 ENCOUNTER — Telehealth: Payer: Self-pay | Admitting: *Deleted

## 2020-04-10 ENCOUNTER — Inpatient Hospital Stay: Payer: Medicare Other

## 2020-04-10 NOTE — Telephone Encounter (Signed)
Called to cancel her lab/OV today. Will call back tomorrow to reschedule.

## 2020-04-20 DIAGNOSIS — H2513 Age-related nuclear cataract, bilateral: Secondary | ICD-10-CM | POA: Diagnosis not present

## 2020-04-20 DIAGNOSIS — H5203 Hypermetropia, bilateral: Secondary | ICD-10-CM | POA: Diagnosis not present

## 2020-04-25 ENCOUNTER — Telehealth: Payer: Self-pay | Admitting: Nurse Practitioner

## 2020-04-25 ENCOUNTER — Telehealth: Payer: Self-pay | Admitting: *Deleted

## 2020-04-25 DIAGNOSIS — Z79891 Long term (current) use of opiate analgesic: Secondary | ICD-10-CM | POA: Diagnosis not present

## 2020-04-25 DIAGNOSIS — G894 Chronic pain syndrome: Secondary | ICD-10-CM | POA: Diagnosis not present

## 2020-04-25 DIAGNOSIS — M797 Fibromyalgia: Secondary | ICD-10-CM | POA: Diagnosis not present

## 2020-04-25 DIAGNOSIS — M47812 Spondylosis without myelopathy or radiculopathy, cervical region: Secondary | ICD-10-CM | POA: Diagnosis not present

## 2020-04-25 NOTE — Telephone Encounter (Signed)
Left VM requesting to reschedule her missed lab/OV and asking for late in afternoon. Scheduling message sent.

## 2020-04-25 NOTE — Telephone Encounter (Signed)
Unable to reach pt. Left voicemail. Scheduled appt on 5/19. Per 5/5 sch msg.

## 2020-05-09 ENCOUNTER — Other Ambulatory Visit: Payer: Self-pay

## 2020-05-09 ENCOUNTER — Encounter: Payer: Self-pay | Admitting: Nurse Practitioner

## 2020-05-09 ENCOUNTER — Inpatient Hospital Stay: Payer: Medicare Other

## 2020-05-09 ENCOUNTER — Inpatient Hospital Stay: Payer: Medicare Other | Attending: Nurse Practitioner | Admitting: Nurse Practitioner

## 2020-05-09 VITALS — BP 144/81 | HR 91 | Temp 97.5°F | Resp 17 | Ht 66.0 in | Wt 223.4 lb

## 2020-05-09 DIAGNOSIS — R5383 Other fatigue: Secondary | ICD-10-CM | POA: Insufficient documentation

## 2020-05-09 DIAGNOSIS — F329 Major depressive disorder, single episode, unspecified: Secondary | ICD-10-CM | POA: Insufficient documentation

## 2020-05-09 DIAGNOSIS — E041 Nontoxic single thyroid nodule: Secondary | ICD-10-CM | POA: Diagnosis not present

## 2020-05-09 DIAGNOSIS — M797 Fibromyalgia: Secondary | ICD-10-CM | POA: Insufficient documentation

## 2020-05-09 DIAGNOSIS — D6959 Other secondary thrombocytopenia: Secondary | ICD-10-CM | POA: Diagnosis not present

## 2020-05-09 DIAGNOSIS — E039 Hypothyroidism, unspecified: Secondary | ICD-10-CM | POA: Diagnosis not present

## 2020-05-09 DIAGNOSIS — I951 Orthostatic hypotension: Secondary | ICD-10-CM | POA: Diagnosis not present

## 2020-05-09 DIAGNOSIS — G8929 Other chronic pain: Secondary | ICD-10-CM | POA: Diagnosis not present

## 2020-05-09 DIAGNOSIS — R0609 Other forms of dyspnea: Secondary | ICD-10-CM | POA: Insufficient documentation

## 2020-05-09 DIAGNOSIS — R590 Localized enlarged lymph nodes: Secondary | ICD-10-CM | POA: Diagnosis not present

## 2020-05-09 DIAGNOSIS — J449 Chronic obstructive pulmonary disease, unspecified: Secondary | ICD-10-CM | POA: Insufficient documentation

## 2020-05-09 DIAGNOSIS — C210 Malignant neoplasm of anus, unspecified: Secondary | ICD-10-CM | POA: Diagnosis not present

## 2020-05-09 DIAGNOSIS — M549 Dorsalgia, unspecified: Secondary | ICD-10-CM | POA: Insufficient documentation

## 2020-05-09 DIAGNOSIS — R160 Hepatomegaly, not elsewhere classified: Secondary | ICD-10-CM | POA: Diagnosis not present

## 2020-05-09 DIAGNOSIS — Z79899 Other long term (current) drug therapy: Secondary | ICD-10-CM | POA: Diagnosis not present

## 2020-05-09 LAB — CBC WITH DIFFERENTIAL (CANCER CENTER ONLY)
Abs Immature Granulocytes: 0.01 10*3/uL (ref 0.00–0.07)
Basophils Absolute: 0 10*3/uL (ref 0.0–0.1)
Basophils Relative: 1 %
Eosinophils Absolute: 0.1 10*3/uL (ref 0.0–0.5)
Eosinophils Relative: 3 %
HCT: 35.3 % — ABNORMAL LOW (ref 36.0–46.0)
Hemoglobin: 12.4 g/dL (ref 12.0–15.0)
Immature Granulocytes: 0 %
Lymphocytes Relative: 15 %
Lymphs Abs: 0.7 10*3/uL (ref 0.7–4.0)
MCH: 32.9 pg (ref 26.0–34.0)
MCHC: 35.1 g/dL (ref 30.0–36.0)
MCV: 93.6 fL (ref 80.0–100.0)
Monocytes Absolute: 0.5 10*3/uL (ref 0.1–1.0)
Monocytes Relative: 12 %
Neutro Abs: 2.9 10*3/uL (ref 1.7–7.7)
Neutrophils Relative %: 69 %
Platelet Count: 75 10*3/uL — ABNORMAL LOW (ref 150–400)
RBC: 3.77 MIL/uL — ABNORMAL LOW (ref 3.87–5.11)
RDW: 13.5 % (ref 11.5–15.5)
WBC Count: 4.2 10*3/uL (ref 4.0–10.5)
nRBC: 0 % (ref 0.0–0.2)

## 2020-05-09 LAB — CMP (CANCER CENTER ONLY)
ALT: 8 U/L (ref 0–44)
AST: 12 U/L — ABNORMAL LOW (ref 15–41)
Albumin: 3.5 g/dL (ref 3.5–5.0)
Alkaline Phosphatase: 84 U/L (ref 38–126)
Anion gap: 8 (ref 5–15)
BUN: 21 mg/dL (ref 8–23)
CO2: 28 mmol/L (ref 22–32)
Calcium: 8.9 mg/dL (ref 8.9–10.3)
Chloride: 105 mmol/L (ref 98–111)
Creatinine: 0.91 mg/dL (ref 0.44–1.00)
GFR, Est AFR Am: >60 mL/min (ref >60)
GFR, Estimated: >60 mL/min (ref >60)
Glucose, Bld: 91 mg/dL (ref 70–99)
Potassium: 3.7 mmol/L (ref 3.5–5.1)
Sodium: 141 mmol/L (ref 135–145)
Total Bilirubin: 1.1 mg/dL (ref 0.3–1.2)
Total Protein: 6.5 g/dL (ref 6.5–8.1)

## 2020-05-09 NOTE — Progress Notes (Signed)
[place] OFFICE PROGRESS NOTE   Diagnosis: Anal cancer  INTERVAL HISTORY:   Joanne Klein returns for follow-up.  She continues to have loose stools, estimating approximately 2/day.  She intermittently notes blood on the toilet tissue after a bowel movement.  No other bleeding.  She has an appointment with Dr. Marcello Moores next week to begin anal surveillance.  She complains of pain behind both knees extending upward for the past 3 weeks.  The pain began after she started doing some new exercises.  She takes oxycodone as needed.  Objective:  Vital signs in last 24 hours:  Blood pressure (!) 144/81, pulse 91, temperature (!) 97.5 F (36.4 C), temperature source Temporal, resp. rate 17, height 5\' 6"  (1.676 m), weight 223 lb 6.4 oz (101.3 kg), SpO2 90 %.    HEENT: Neck without mass. Lymphatics: No palpable cervical, supraclavicular, axillary or inguinal lymph nodes. Resp: Lungs clear bilaterally. Cardio: Regular rate and rhythm. GI: Abdomen soft and nontender.  No hepatomegaly.  No mass.  Soft external hemorrhoids.  Digital rectal exam deferred due to upcoming endoscopy. Vascular: No leg edema. Musculoskeletal: Tender at the popliteal fossa bilaterally. Skin: Skin at the perineum/perianal region consistent with changes related to radiation.   Lab Results:  Lab Results  Component Value Date   WBC 4.2 05/09/2020   HGB 12.4 05/09/2020   HCT 35.3 (L) 05/09/2020   MCV 93.6 05/09/2020   PLT 75 (L) 05/09/2020   NEUTROABS 2.9 05/09/2020    Imaging:  No results found.  Medications: I have reviewed the patient's current medications.  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 Biopsy 08/30/2019 confirmed well-differentiated squamous cell carcinoma with positive P 16 and p63 stains ? CTs 09/14/2019-abnormal soft tissue fullness at the lower anus/perianal soft tissues, asymmetric left inguinal lymphadenopathy, borderline enlarged left external iliac  node, mild stranding and cutaneous thickening of the left gluteal fold, 2 to 3 mm pulmonary nodules, 1.6 cm right hepatic lesion-likely a hemangioma ? PET scan [id number] hypermetabolic anal mass.  3 hypermetabolic left inguinal lymph nodes.  Left external iliac node with minimally higher than blood pool activity.  1.0 x 0.8 cm left thyroid nodule with activity mildly above background blood pool activity but below liver activity.  No perceptible hypermetabolic activity in the vicinity of the lateral right hepatic lobe lesion seen on the 09/14/2019 CT. ? Began concurrent chemoRT with 5FU/mitomycin on 10/03/2019 ? Cycle25-FU, dose reduced, mitomycin held 11/07/2019 ? Radiation completed 11/24/2019 ? CT abdomen/pelvis 12/27/2019-extensive left-sided colonic diverticulosis without diverticulitis.  No bowel obstruction.  No abscess in the abdomen or pelvis.  Rectum borderline distended with stool without perirectal wall thickening or soft tissue stranding.  Left inguinal and external iliac lymph nodes now subcentimeter compared to the previous size.  Stable small lesion in the right lobe of the liver.  No new liver lesions evident. 2. COPD 3. Fibromyalgia 4. Chronic back pain 5. Hypothyroid 6. Depression 7. 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 Pain secondary to #1 9. Orthostatic hypotension, fatigue, dyspnea on exertion, early mucositis requiring supportive care on day 9, secondary to chemotherapy  10. Thrombocytopenia PLT 58K and neutropenia ANC 1.4 on day 9, secondary to chemotherapy 11. Worsening cytopenias, PLT 8K and ANC 0.0 on day 15, secondary to chemotherapy. Started prophylaxis with cipro on 10/22 day 11 12. Hospital admission 10/17/2019 -fatigue, pancytopenia, hypokalemia 13.  Left thyroid nodule with activity mildly above background blood pool activity but below liver activity on PET scan 09/27/2019.  Thyroid ultrasound 219 2021-1.6 cm nodule left inferior thyroid gland.  [id number]  FNA-benign follicular nodule. 14.  Tiny lung nodules noted on chest CT 09/14/2019  Disposition: Joanne Klein is in clinical remission from anal cancer.  She has an appointment with Dr. Marcello Moores next week for anal surveillance.  Plan for follow-up CT scans in approximately 4 months.  We reviewed the CBC from today.  Hemoglobin has corrected into normal range.  She has persistent mild to moderate thrombocytopenia.  This may be a delayed effect of the chemotherapy and radiation.  She understands to contact the office with any bleeding.  She will return for a follow-up CBC in 4 weeks.  The bilateral posterior leg pain may be related to some exercises she was doing a few weeks ago.  She will follow-up with her PCP if the pain persists.  She return for a follow-up appointment in approximately 4 months, CT scans a few days prior.    Ned Card ANP/GNP-BC   05/09/2020  3:56 PM

## 2020-05-10 ENCOUNTER — Telehealth: Payer: Self-pay | Admitting: Oncology

## 2020-05-10 ENCOUNTER — Other Ambulatory Visit: Payer: Self-pay | Admitting: Nurse Practitioner

## 2020-05-10 DIAGNOSIS — D696 Thrombocytopenia, unspecified: Secondary | ICD-10-CM

## 2020-05-10 DIAGNOSIS — C210 Malignant neoplasm of anus, unspecified: Secondary | ICD-10-CM

## 2020-05-10 NOTE — Telephone Encounter (Signed)
Scheduled per 5/20 sch message. Unable to reach pt- left voicemail. appts on 6/15.

## 2020-05-10 NOTE — Telephone Encounter (Signed)
Scheduled per los. Called and left msg. Mailed printout  °

## 2020-05-14 DIAGNOSIS — Z85048 Personal history of other malignant neoplasm of rectum, rectosigmoid junction, and anus: Secondary | ICD-10-CM | POA: Diagnosis not present

## 2020-05-26 ENCOUNTER — Emergency Department (HOSPITAL_COMMUNITY)
Admission: EM | Admit: 2020-05-26 | Discharge: 2020-05-27 | Disposition: A | Discharge status: Home / Self Care | Payer: Medicare Other | Attending: Emergency Medicine | Admitting: Emergency Medicine

## 2020-05-26 ENCOUNTER — Other Ambulatory Visit: Payer: Self-pay

## 2020-05-26 ENCOUNTER — Emergency Department (HOSPITAL_COMMUNITY): Payer: Medicare Other

## 2020-05-26 ENCOUNTER — Encounter (HOSPITAL_COMMUNITY): Payer: Self-pay

## 2020-05-26 DIAGNOSIS — S8264XA Nondisplaced fracture of lateral malleolus of right fibula, initial encounter for closed fracture: Secondary | ICD-10-CM | POA: Insufficient documentation

## 2020-05-26 DIAGNOSIS — E039 Hypothyroidism, unspecified: Secondary | ICD-10-CM | POA: Diagnosis not present

## 2020-05-26 DIAGNOSIS — Y999 Unspecified external cause status: Secondary | ICD-10-CM | POA: Diagnosis not present

## 2020-05-26 DIAGNOSIS — Y939 Activity, unspecified: Secondary | ICD-10-CM | POA: Insufficient documentation

## 2020-05-26 DIAGNOSIS — Z79899 Other long term (current) drug therapy: Secondary | ICD-10-CM | POA: Insufficient documentation

## 2020-05-26 DIAGNOSIS — Z85048 Personal history of other malignant neoplasm of rectum, rectosigmoid junction, and anus: Secondary | ICD-10-CM | POA: Insufficient documentation

## 2020-05-26 DIAGNOSIS — I10 Essential (primary) hypertension: Secondary | ICD-10-CM | POA: Insufficient documentation

## 2020-05-26 DIAGNOSIS — Z8542 Personal history of malignant neoplasm of other parts of uterus: Secondary | ICD-10-CM | POA: Diagnosis not present

## 2020-05-26 DIAGNOSIS — W19XXXA Unspecified fall, initial encounter: Secondary | ICD-10-CM | POA: Diagnosis not present

## 2020-05-26 DIAGNOSIS — J449 Chronic obstructive pulmonary disease, unspecified: Secondary | ICD-10-CM | POA: Insufficient documentation

## 2020-05-26 DIAGNOSIS — Y929 Unspecified place or not applicable: Secondary | ICD-10-CM | POA: Diagnosis not present

## 2020-05-26 DIAGNOSIS — Z87891 Personal history of nicotine dependence: Secondary | ICD-10-CM | POA: Diagnosis not present

## 2020-05-26 DIAGNOSIS — S8261XA Displaced fracture of lateral malleolus of right fibula, initial encounter for closed fracture: Secondary | ICD-10-CM | POA: Diagnosis not present

## 2020-05-26 DIAGNOSIS — S99911A Unspecified injury of right ankle, initial encounter: Secondary | ICD-10-CM | POA: Diagnosis present

## 2020-05-26 DIAGNOSIS — M545 Low back pain: Secondary | ICD-10-CM | POA: Diagnosis not present

## 2020-05-26 DIAGNOSIS — M25561 Pain in right knee: Secondary | ICD-10-CM | POA: Insufficient documentation

## 2020-05-26 DIAGNOSIS — M5442 Lumbago with sciatica, left side: Secondary | ICD-10-CM | POA: Diagnosis not present

## 2020-05-26 DIAGNOSIS — G8929 Other chronic pain: Secondary | ICD-10-CM | POA: Diagnosis not present

## 2020-05-26 DIAGNOSIS — M25562 Pain in left knee: Secondary | ICD-10-CM | POA: Diagnosis not present

## 2020-05-26 DIAGNOSIS — S72431A Displaced fracture of medial condyle of right femur, initial encounter for closed fracture: Secondary | ICD-10-CM | POA: Diagnosis not present

## 2020-05-26 DIAGNOSIS — R296 Repeated falls: Secondary | ICD-10-CM | POA: Diagnosis not present

## 2020-05-26 MED ORDER — OXYCODONE HCL 5 MG PO TABS
20.0000 mg | ORAL_TABLET | Freq: Once | ORAL | Status: AC
  Administered 2020-05-26: 20 mg via ORAL
  Filled 2020-05-26: qty 4

## 2020-05-26 NOTE — ED Provider Notes (Signed)
Joanne Klein Provider Note   CSN: [id number] Arrival date & time: 05/26/20  1921     History Chief Complaint  Patient presents with  . Back Pain  . Weakness    Joanne Klein is a 74 y.o. female.  Patient with history of chronic back pain presenting to the ED with worsening of her chronic pain as well as a fall in which she injured her right knee 2 weeks ago and a fall in which she injured her right ankle 1 week ago.  ABCs intact on arrival.  The history is provided by the patient and medical records.  Illness Location:  Back, right knee, right ankle Quality:  Pain Severity:  Moderate Onset quality:  Gradual Timing:  Constant Progression:  Unchanged Chronicity:  Chronic (Back pain is acute on chronic, knee and ankle pain are subacute) Context:  Patient had mechanical falls injuring her right knee and right ankle. Relieved by:  Increasing home pain medication Worsened by:  Ambulation Ineffective treatments:  Current home pain medication Associated symptoms: no abdominal pain, no chest pain, no cough, no fatigue, no fever, no headaches, no loss of consciousness, no nausea, no shortness of breath and no vomiting        Past Medical History:  Diagnosis Date  . Allergic rhinitis   . Arthritis   . Bursitis    Both Shoulders  . Chronic low back pain   . Degenerative disk disease   . Depression   . Diverticulitis   . Emphysema of lung ([place])   . GERD (gastroesophageal reflux disease)   . History of fibromyalgia   . Hypertension   . Hypothyroidism   . Irritable bowel syndrome   . Neuralgia, post-herpetic   . Obesity, unspecified   . Obstructive sleep apnea   . OCD (obsessive compulsive disorder)   . Pain in joint, site unspecified   . Pure hypercholesterolemia   . Scoliosis   . Situational stress   . Spastic colon   . Tubular adenoma   . Uterine cancer [facility])     Patient Active Problem List   Diagnosis Date Noted  .  Chronic respiratory failure with hypoxia ([place]) 12/15/2019  . Orthostatic hypotension 12/15/2019  . Numbness 12/15/2019  . Chemotherapy-induced neutropenia ([place])   . Thrombocytopenia ([place]) 10/17/2019  . 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 Routine general medical examination at a health care facility 11/19/2016  . 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 Morbid obesity ([place]) 06/26/2014  . COPD GOLD II    . Hyperlipidemia   . Arthritis 01/16/2014  . Depression 01/13/2014  . Hypothyroidism 01/13/2014  . GERD (gastroesophageal reflux disease) 01/13/2014  . OCD (obsessive compulsive disorder) 01/13/2014  . Fibromyalgia 01/13/2014    Past Surgical History:  Procedure Laterality Date  . BREAST LUMPECTOMY     knot removed  . CHOLECYSTECTOMY    . HYSTEROTOMY    . TONSILLECTOMY    . tumor in left forearm       OB History   No obstetric history on file.     Family History  Problem Relation Age of Onset  . Stomach cancer Father   . Liver cancer Father   . Diabetes Father   . Colon cancer Neg Hx     Social History   Tobacco Use  . Smoking status: Former Smoker    Packs/day: 2.00    Years: 34.00    Pack years: 68.00    Types: Cigarettes    Quit date:  12/15/1996    Years since quitting: 23.4  . Smokeless tobacco: Never Used  . Tobacco comment: vapes x 2 years  Substance Use Topics  . Alcohol use: No    Alcohol/week: 0.0 standard drinks    Comment: Caffine Intake 2x's daily  . Drug use: No    Home Medications Prior to Admission medications   Medication Sig Start Date End Date Taking? Authorizing Provider  atorvastatin (LIPITOR) 20 MG tablet TAKE 1 TABLET BY MOUTH  EVERY OTHER DAY Patient taking differently: Take 20 mg by mouth 4 (four) times a week. Saturday, Sunday, Tuesday, and Thursday 02/13/20  Yes Hoyt Koch, MD  calcium carbonate (TUMS - DOSED IN MG ELEMENTAL CALCIUM) 500 MG chewable tablet Chew 1 tablet by mouth daily as needed.    Yes [provider]    Cholecalciferol (VITAMIN D-3) 1000 UNITS CAPS Take 1,000 Units by mouth daily.    Yes [provider]  levothyroxine (SYNTHROID) 75 MCG tablet TAKE 1 TABLET BY MOUTH  DAILY BEFORE BREAKFAST Patient taking differently: Take 75 mcg by mouth daily before breakfast.  02/07/20  Yes Hoyt Koch, MD  loperamide (IMODIUM) 2 MG capsule Take 1 capsule (2 mg total) by mouth as needed for diarrhea or loose stools. 11/16/19  Yes Lucrezia Starch, MD  omeprazole (PRILOSEC) 40 MG capsule TAKE 1 CAPSULE BY MOUTH  DAILY Patient taking differently: Take 40 mg by mouth daily.  02/07/20  Yes Hoyt Koch, MD  ondansetron (ZOFRAN) 8 MG tablet Take 8 mg by mouth every 8 (eight) hours as needed for nausea or vomiting.   Yes [provider]  oxyCODONE-acetaminophen (PERCOCET) 10-325 MG tablet Take 1 tablet by mouth every 4 (four) hours as needed for pain. Gets from another provider @@ 180 per month   Yes [provider]  phenylephrine-shark liver oil-mineral oil-petrolatum (PREPARATION H) 0.25-3-14-71.9 % rectal ointment Place 1 application rectally 2 (two) times daily as needed for hemorrhoids.   Yes [provider]  Polyethylene Glycol 3350 (MIRALAX PO) Take 17 g by mouth at bedtime.    Yes [provider]  prochlorperazine (COMPAZINE) 10 MG tablet Take 10 mg by mouth every 6 (six) hours as needed for nausea or vomiting.   Yes [provider]  sertraline (ZOLOFT) 100 MG tablet TAKE 2 TABLETS BY MOUTH AT  BEDTIME Patient taking differently: Take 200 mg by mouth every evening.  02/07/20  Yes Hoyt Koch, MD  potassium chloride SA (KLOR-CON) 20 MEQ tablet Take 1 tablet (20 mEq total) by mouth daily. Patient not taking: Reported on 05/26/2020 12/13/19   Ladell Pier, MD    Allergies    Prozac [fluoxetine hcl], Codeine, Morphine and related, and Sulfa antibiotics  Review of Systems   Review of Systems  Constitutional: Negative for  fatigue and fever.  Respiratory: Negative for cough and shortness of breath.   Cardiovascular: Negative for chest pain.  Gastrointestinal: Negative for abdominal pain, nausea and vomiting.  Musculoskeletal: Positive for arthralgias, back pain, gait problem and joint swelling. Negative for neck pain.  Neurological: Negative for loss of consciousness and headaches.  All other systems reviewed and are negative.   Physical Exam Updated Vital Signs BP (!) 165/95 (BP Location: Right Arm)   Pulse 86   Temp 98.5 F (36.9 C) (Oral)   Resp 16   Ht 5\' 6"  (1.676 m)   Wt 101.3 kg   SpO2 95%   BMI 36.05 kg/m   Physical Exam Vitals  and nursing note reviewed.  Constitutional:      General: She is not in acute distress.    Appearance: She is well-developed.  HENT:     Head: Normocephalic and atraumatic.     Right Ear: External ear normal.     Left Ear: External ear normal.     Nose: Nose normal.     Mouth/Throat:     Mouth: Mucous membranes are moist.     Pharynx: Oropharynx is clear.  Eyes:     Extraocular Movements: Extraocular movements intact.     Conjunctiva/sclera: Conjunctivae normal.     Pupils: Pupils are equal, round, and reactive to light.  Cardiovascular:     Rate and Rhythm: Normal rate and regular rhythm.     Heart sounds: No murmur.  Pulmonary:     Effort: Pulmonary effort is normal. No respiratory distress.     Breath sounds: Normal breath sounds.  Abdominal:     Palpations: Abdomen is soft.     Tenderness: There is no abdominal tenderness.  Musculoskeletal:     Cervical back: Neck supple.     Comments: Patient has mild tenderness across the entire low back.  Patient has positive straight leg test.  Patient has swelling and bruising around the right ankle and pain with palpation of the anterior portion of the lateral malleolus.  Patient able to range right knee without difficulty but some pain with ambulation.  Skin:    General: Skin is warm and dry.  Neurological:      General: No focal deficit present.     Mental Status: She is alert and oriented to person, place, and time.     Comments: Patient denies dysuria hesitancy or signs of urinary retention  Psychiatric:        Mood and Affect: Mood normal.        Behavior: Behavior normal.     ED Results / Procedures / Treatments   Labs (all labs ordered are listed, but only abnormal results are displayed) Labs Reviewed - No data to display  EKG None  Radiology DG Ankle Complete Right  Result Date: 05/26/2020 CLINICAL DATA:  BILATERAL lower extremity weakness and pain, frequent falls EXAM: RIGHT ANKLE - COMPLETE 3+ VIEW COMPARISON:  None FINDINGS: Osseous demineralization. Ankle joint space preserved. Diffuse soft tissue swelling greatest laterally. Minimally displaced oblique fracture of the lateral malleolus. No additional fracture, dislocation, or bone destruction. IMPRESSION: Minimally displaced oblique fracture of the lateral malleolus with overlying soft tissue swelling. Electronically Signed   By: Lavonia Dana M.D.   On: 05/26/2020 20:14   CT Lumbar Spine Wo Contrast  Result Date: 05/26/2020 CLINICAL DATA:  Initial evaluation lower back pain for 1 month, with bilateral lower extremity weakness, frequent falls. EXAM: CT LUMBAR SPINE WITHOUT CONTRAST TECHNIQUE: Multidetector CT imaging of the lumbar spine was performed without intravenous contrast administration. Multiplanar CT image reconstructions were also generated. COMPARISON:  Prior CT from 09/14/2019. FINDINGS: Segmentation: Standard. Lowest well-formed disc space labeled the L5-S1 level. Alignment: Mild levoscoliosis with apex at T12-L1.  No listhesis. Vertebrae: Vertebral body height maintained without evidence for acute or chronic fracture. Visualized sacrum and pelvis intact. No discrete or worrisome osseous lesions. Paraspinal and other soft tissues: Chronic fatty atrophy noted within the posterior paraspinous soft tissues. There is suggestion  of soft tissue density/stranding involving the presacral space anterior to the sacrum (series 5, image 139), incompletely visualized. Findings are nonspecific, and could reflect post radiation changes related to prior uterine cancer.  Paraspinous soft tissues demonstrate no other acute finding. Moderate aorto bi-iliac atherosclerotic disease without aneurysm. Cholecystectomy clips noted. Disc levels: T11-12: Degenerative intervertebral disc space narrowing with disc desiccation and mild disc bulge. No significant stenosis. T12-L1: Degenerative intervertebral disc space narrowing with disc desiccation and mild disc bulge. Mild facet hypertrophy. No significant stenosis. L1-2: Mild diffuse disc bulge with disc desiccation. Mild-to-moderate facet hypertrophy. No significant stenosis. L2-3: Mild disc bulge. Mild to moderate facet hypertrophy. No significant spinal stenosis. Foramina remain patent. L3-4: Chronic intervertebral disc space narrowing with diffuse disc bulge. Moderate bilateral facet hypertrophy. No spinal stenosis. Mild bilateral L3 foraminal narrowing. L4-5: Chronic intervertebral disc space narrowing with diffuse disc bulge and disc desiccation. Moderate bilateral facet hypertrophy. Resultant mild canal with bilateral lateral recess stenosis. Mild right with moderate left L4 foraminal narrowing. L5-S1: Chronic intervertebral disc space narrowing with diffuse disc bulge. Mild reactive endplate changes. Moderate left worse than right facet hypertrophy. No significant spinal stenosis. Moderate bilateral L5 foraminal narrowing, greater on the left. IMPRESSION: 1. No acute abnormality within the lumbar spine. 2. Multilevel degenerative spondylosis with resultant mild canal with bilateral lateral recess stenosis at L4-5. 3. Moderate bilateral L4 and L5 foraminal stenosis related to disc bulge, reactive endplate changes, and facet degeneration. 4. Soft tissue density/stranding involving the presacral space  anterior to the sacrum, incompletely visualized. Findings are nonspecific, and could reflect post radiation changes related to prior malignancy. Correlation with history recommended. If there is no history of prior radiation treatment, further assessment with dedicated CT of the abdomen and pelvis could be performed for further characterization as warranted. 5. Aortic Atherosclerosis (ICD10-I70.0). Electronically Signed   By: Jeannine Boga M.D.   On: 05/26/2020 23:33   DG Knee Complete 4 Views Left  Result Date: 05/26/2020 CLINICAL DATA:  BILATERAL lower extremity weakness and pain, multiple falls EXAM: LEFT KNEE - COMPLETE 4+ VIEW COMPARISON:  10/16/2019 FINDINGS: Osseous demineralization. Joint spaces preserved. No acute fracture, dislocation, or bone destruction. No joint effusion. IMPRESSION: No acute osseous abnormalities. Electronically Signed   By: Lavonia Dana M.D.   On: 05/26/2020 20:15   DG Knee Complete 4 Views Right  Result Date: 05/26/2020 CLINICAL DATA:  Pain and weakness in BILATERAL lower extremities, frequent falls EXAM: RIGHT KNEE - COMPLETE 4+ VIEW COMPARISON:  None FINDINGS: Mild osseous demineralization. Joint space narrowing diffusely. Tiny cortical avulsion fracture identified at the medial margin of the medial femoral condyle superiorly. No additional fracture, dislocation, or bone destruction. IMPRESSION: Small cortical avulsion fracture identified at medial margin of the medial femoral condyle, potentially at medial collateral ligament origin. No additional acute osseous abnormalities. Electronically Signed   By: Lavonia Dana M.D.   On: 05/26/2020 20:13    Procedures Procedures (including critical care time)  Medications Ordered in ED Medications  oxyCODONE (Oxy IR/ROXICODONE) immediate release tablet 20 mg (20 mg Oral Given 05/26/20 2207)    ED Course  I have reviewed the triage vital signs and the nursing notes.  Pertinent labs & imaging results that were available  during my care of the patient were reviewed by me and considered in my medical decision making (see chart for details).    MDM Rules/Calculators/A&P                       Differential diagnosis: Right ankle injury, right knee injury, left knee injury, low back pain, acute on chronic lumbar spine injury  ED physician interpretation of imaging: X-ray of the right ankle  shows lateral malleolus fracture.  No dislocation of the right knee but possible small avulsion fracture.  Patient CT spine shows no acute fracture.  MDM: Patient is a 74 year old female with history of chronic low back pain, rectal cancer with radiation presenting with worsening of her chronic low back pain, right knee pain and right ankle pain found to have a right ankle fracture requiring CAM Walker applied by orthopedic tech, possible right knee avulsion fracture and stable L-spine without acute fractures or red flag symptoms appropriate outpatient follow-up with orthopedic surgery for the knee and ankle injuries.  Patient's vital signs are stable, patient afebrile.  Patient's physical exam is remarkable for the aforementioned injuries to the right ankle, right knee.  Patient has swelling about her right ankle and pain with a Cam walker on the front of her ankle.  Patient instructed to use walker and light touch on the right ankle until her swelling goes down.  Patient states she will be able to tolerate this and will likely feel better once the swelling goes down.  Patient states understanding to follow-up with orthopedic surgery for her right knee and right ankle.  Patient's back pain is chronic in nature and likely exacerbated by patient's changed gait due to right ankle fracture.  Increasing patient's home pain medication the ED provided relief.  Patient instructed to follow-up with her pain doctor for further management of chronic pain.  Diagnosis, treatment and plan of care was discussed and agreed upon with patient.   Patient comfortable with discharge at this time.   Key discharge instructions: You presented to the ED with worsening low back pain that appears to be a flare of her chronic back pain with also some sciatic features.  You were given an increased dose of your home pain medication with improvement in pain.  Recommend following up closely with your pain clinic for pain medication adjustment.  Additionally you are found to have a right lateral malleolus fracture and a cam walker was placed.  Recommend using your walker when walking with your cam walker.  Additionally you have a small avulsion fracture knee.  For both your ankle and your knee injury please follow-up with orthopedic surgery.  Information in your discharge paperwork for these services.   Final Clinical Impression(s) / ED Diagnoses Final diagnoses:  Closed nondisplaced fracture of lateral malleolus of right fibula, initial encounter  Chronic bilateral low back pain with left-sided sciatica    Rx / DC Orders ED Discharge Orders    None       Delma Post, MD 05/27/20 1154    Elnora Morrison, MD 05/28/20 0005

## 2020-05-26 NOTE — ED Triage Notes (Signed)
Pt arrives POV for eval of lower back pain onset last month. Pt reports progressing back pain x 1 month now w/ Blt LE weakness, frequent falls and inability to ambulate. Pt denies taking blood thinners. Denies N/T.

## 2020-05-27 NOTE — Discharge Instructions (Addendum)
You presented to the ED with worsening low back pain that appears to be a flare of her chronic back pain with also some sciatic features.  You were given an increased dose of your home pain medication with improvement in pain.  Recommend following up closely with your pain clinic for pain medication adjustment.  Additionally you are found to have a right lateral malleolus fracture and a cam walker was placed.  Recommend using your walker when walking with your cam walker.  Additionally you have a small avulsion fracture knee.  For both your ankle and your knee injury please follow-up with orthopedic surgery.  Information in your discharge paperwork for these services.

## 2020-05-27 NOTE — Progress Notes (Signed)
Orthopedic Tech Progress Note Patient Details:  ZURI LASCALA 1946-03-23 [id number]  Ortho Devices Type of Ortho Device: CAM walker Ortho Device/Splint Location: rle Ortho Device/Splint Interventions: Ordered, Application, Adjustment   Post Interventions Patient Tolerated: Well Instructions Provided: Care of device, Adjustment of device   Karolee Stamps 05/27/2020, 1:17 AM

## 2020-06-04 DIAGNOSIS — S82831A Other fracture of upper and lower end of right fibula, initial encounter for closed fracture: Secondary | ICD-10-CM | POA: Diagnosis not present

## 2020-06-05 ENCOUNTER — Inpatient Hospital Stay: Payer: Medicare Other

## 2020-06-05 ENCOUNTER — Inpatient Hospital Stay: Payer: Medicare Other | Admitting: Nurse Practitioner

## 2020-06-05 ENCOUNTER — Telehealth: Payer: Self-pay | Admitting: *Deleted

## 2020-06-05 ENCOUNTER — Telehealth: Payer: Self-pay | Admitting: Oncology

## 2020-06-05 ENCOUNTER — Other Ambulatory Visit: Payer: Self-pay | Admitting: Neurosurgery

## 2020-06-05 ENCOUNTER — Other Ambulatory Visit (HOSPITAL_COMMUNITY): Payer: Self-pay | Admitting: Neurosurgery

## 2020-06-05 DIAGNOSIS — R29898 Other symptoms and signs involving the musculoskeletal system: Secondary | ICD-10-CM | POA: Diagnosis not present

## 2020-06-05 DIAGNOSIS — R030 Elevated blood-pressure reading, without diagnosis of hypertension: Secondary | ICD-10-CM | POA: Diagnosis not present

## 2020-06-05 NOTE — Telephone Encounter (Signed)
Scheduled appt per 6/15 sch  Message - pt is aware of apt date and time

## 2020-06-05 NOTE — Telephone Encounter (Signed)
Called to cancel her appointments today and request reschedule as soon as possible. Has recently fallen and has fractured ankle, knee and herniated disc. She reports she is bruising a lot and concerned her platelets may be low. Scheduling message sent to reschedule for 1st available for lab/OV

## 2020-06-07 ENCOUNTER — Ambulatory Visit (HOSPITAL_COMMUNITY)
Admission: RE | Admit: 2020-06-07 | Discharge: 2020-06-07 | Disposition: A | Discharge status: Home / Self Care | Payer: Medicare Other | Source: Ambulatory Visit | Attending: Neurosurgery | Admitting: Neurosurgery

## 2020-06-07 ENCOUNTER — Encounter (HOSPITAL_COMMUNITY): Payer: Self-pay

## 2020-06-07 ENCOUNTER — Other Ambulatory Visit: Payer: Self-pay

## 2020-06-07 DIAGNOSIS — R29898 Other symptoms and signs involving the musculoskeletal system: Secondary | ICD-10-CM

## 2020-06-07 NOTE — Progress Notes (Addendum)
Pt. Attempted MRI at [facility]. Pt says she CAN NOT tolerate being in scanner. Says it needs to be larger. This is one of the largest scanners in the system at 70cm in width. We advised pt to call MD to request meds for claustrophobia. Pt sts there is no meds she knows that are stong enough to get her in there. Pt is requesting to "put to sleep" to do exam. GA is handled by the [facility].

## 2020-06-11 ENCOUNTER — Inpatient Hospital Stay: Payer: Medicare Other

## 2020-06-11 ENCOUNTER — Inpatient Hospital Stay: Payer: Medicare Other | Attending: Nurse Practitioner | Admitting: Nurse Practitioner

## 2020-06-11 ENCOUNTER — Other Ambulatory Visit: Payer: Self-pay

## 2020-06-11 VITALS — BP 144/83 | HR 87 | Temp 97.5°F | Resp 17 | Ht 66.0 in

## 2020-06-11 DIAGNOSIS — Z79899 Other long term (current) drug therapy: Secondary | ICD-10-CM | POA: Insufficient documentation

## 2020-06-11 DIAGNOSIS — I951 Orthostatic hypotension: Secondary | ICD-10-CM | POA: Insufficient documentation

## 2020-06-11 DIAGNOSIS — D61818 Other pancytopenia: Secondary | ICD-10-CM | POA: Diagnosis not present

## 2020-06-11 DIAGNOSIS — F329 Major depressive disorder, single episode, unspecified: Secondary | ICD-10-CM | POA: Diagnosis not present

## 2020-06-11 DIAGNOSIS — M47816 Spondylosis without myelopathy or radiculopathy, lumbar region: Secondary | ICD-10-CM | POA: Diagnosis not present

## 2020-06-11 DIAGNOSIS — E041 Nontoxic single thyroid nodule: Secondary | ICD-10-CM | POA: Diagnosis not present

## 2020-06-11 DIAGNOSIS — E039 Hypothyroidism, unspecified: Secondary | ICD-10-CM | POA: Diagnosis not present

## 2020-06-11 DIAGNOSIS — D696 Thrombocytopenia, unspecified: Secondary | ICD-10-CM

## 2020-06-11 DIAGNOSIS — D6959 Other secondary thrombocytopenia: Secondary | ICD-10-CM | POA: Insufficient documentation

## 2020-06-11 DIAGNOSIS — M5126 Other intervertebral disc displacement, lumbar region: Secondary | ICD-10-CM | POA: Diagnosis not present

## 2020-06-11 DIAGNOSIS — M48061 Spinal stenosis, lumbar region without neurogenic claudication: Secondary | ICD-10-CM | POA: Diagnosis not present

## 2020-06-11 DIAGNOSIS — C210 Malignant neoplasm of anus, unspecified: Secondary | ICD-10-CM | POA: Insufficient documentation

## 2020-06-11 LAB — CMP (CANCER CENTER ONLY)
ALT: 13 U/L (ref 0–44)
AST: 12 U/L — ABNORMAL LOW (ref 15–41)
Albumin: 3.5 g/dL (ref 3.5–5.0)
Alkaline Phosphatase: 89 U/L (ref 38–126)
Anion gap: 10 (ref 5–15)
BUN: 23 mg/dL (ref 8–23)
CO2: 27 mmol/L (ref 22–32)
Calcium: 9 mg/dL (ref 8.9–10.3)
Chloride: 103 mmol/L (ref 98–111)
Creatinine: 0.92 mg/dL (ref 0.44–1.00)
GFR, Est AFR Am: >60 mL/min (ref >60)
GFR, Estimated: >60 mL/min (ref >60)
Glucose, Bld: 121 mg/dL — ABNORMAL HIGH (ref 70–99)
Potassium: 3 mmol/L — CL (ref 3.5–5.1)
Sodium: 140 mmol/L (ref 135–145)
Total Bilirubin: 1.3 mg/dL — ABNORMAL HIGH (ref 0.3–1.2)
Total Protein: 6.6 g/dL (ref 6.5–8.1)

## 2020-06-11 LAB — SAVE SMEAR(SSMR), FOR PROVIDER SLIDE REVIEW

## 2020-06-11 LAB — CBC WITH DIFFERENTIAL (CANCER CENTER ONLY)
Abs Immature Granulocytes: 0.02 10*3/uL (ref 0.00–0.07)
Basophils Absolute: 0 10*3/uL (ref 0.0–0.1)
Basophils Relative: 0 %
Eosinophils Absolute: 0.1 10*3/uL (ref 0.0–0.5)
Eosinophils Relative: 1 %
HCT: 35.3 % — ABNORMAL LOW (ref 36.0–46.0)
Hemoglobin: 12.5 g/dL (ref 12.0–15.0)
Immature Granulocytes: 0 %
Lymphocytes Relative: 11 %
Lymphs Abs: 0.6 10*3/uL — ABNORMAL LOW (ref 0.7–4.0)
MCH: 32.3 pg (ref 26.0–34.0)
MCHC: 35.4 g/dL (ref 30.0–36.0)
MCV: 91.2 fL (ref 80.0–100.0)
Monocytes Absolute: 0.5 10*3/uL (ref 0.1–1.0)
Monocytes Relative: 9 %
Neutro Abs: 4 10*3/uL (ref 1.7–7.7)
Neutrophils Relative %: 79 %
Platelet Count: 113 10*3/uL — ABNORMAL LOW (ref 150–400)
RBC: 3.87 MIL/uL (ref 3.87–5.11)
RDW: 13.2 % (ref 11.5–15.5)
WBC Count: 5.2 10*3/uL (ref 4.0–10.5)
nRBC: 0 % (ref 0.0–0.2)

## 2020-06-11 NOTE — Progress Notes (Addendum)
Joanne OFFICE PROGRESS NOTE   Diagnosis: Anal cancer  INTERVAL HISTORY:   Joanne Klein returns as scheduled.  She reports progressive backslash leg pain and leg weakness over the past several weeks.  She has had multiple falls.  She was seen in the emergency department on 05/26/2020.  X-ray of the right ankle showed a minimally displaced oblique fracture of the lateral malleolus.  X-ray of the right knee showed a small cortical avulsion fracture at the medial margin of the medial femoral condyle.  CT of the lumbar spine showed multilevel degenerative spondylosis with resultant mild canal with bilateral lateral recess stenosis at L4-5.  Moderate bilateral L4 and L5 foraminal stenosis related to disc bulge, reactive endplate changes and facet degeneration.  Soft tissue density/stranding involving the presacral space anterior to the sacrum, incompletely visualized.  She has seen Dr. Annette Stable and referred for an MRI.  She was unable to tolerate the MRI due to claustrophobia.  Objective:  Vital signs in last 24 hours:  Blood pressure (!) 144/83, pulse 87, temperature (!) 97.5 F (36.4 C), temperature source Temporal, resp. rate 17, height 5\' 6"  (1.676 m), SpO2 97 %.    HEENT: Pupils equal round and reactive to light.  Extraocular movements intact. Lymphatics: No palpable inguinal lymph nodes. GI: Abdomen soft and nontender. Vascular: No leg edema. Neuro: Upper extremity motor strength intact.  Left foot drop.  Right proximal leg weakness. Skin: Scattered ecchymoses.   Lab Results:  Lab Results  Component Value Date   WBC 5.2 06/11/2020   HGB 12.5 06/11/2020   HCT 35.3 (L) 06/11/2020   MCV 91.2 06/11/2020   PLT 113 (L) 06/11/2020   NEUTROABS 4.0 06/11/2020    Imaging:  No results found.  Medications: I have reviewed the patient's current medications.  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 Biopsy 08/30/2019 confirmed well-differentiated  squamous cell carcinoma with positive P 16 and p63 stains ? CTs 09/14/2019-abnormal soft tissue fullness at the lower anus/perianal soft tissues, asymmetric left inguinal lymphadenopathy, borderline enlarged left external iliac node, mild stranding and cutaneous thickening of the left gluteal fold, 2 to 3 mm pulmonary nodules, 1.6 cm right hepatic lesion-likely a hemangioma ? PET scan 93/06/1695-[id number] hypermetabolic anal mass. 3 hypermetabolic left inguinal lymph nodes. Left external iliac node with minimally higher than blood pool activity. 1.0 x 0.8 cm left thyroid nodule with activity mildly above background blood pool activity but below liver activity. No perceptible hypermetabolic activity in the vicinity of the lateral right hepatic lobe lesion seen on the 09/14/2019 CT. ? Began concurrent chemoRT with 5FU/mitomycin on 10/03/2019 ? Cycle25-FU, dose reduced, mitomycin held 11/07/2019 ? Radiation completed 11/24/2019 ? CT abdomen/pelvis 12/27/2019-extensive left-sided colonic diverticulosis without diverticulitis. No bowel obstruction. No abscess in the abdomen or pelvis. Rectum borderline distended with stool without perirectal wall thickening or soft tissue stranding. Left inguinal and external iliac lymph nodes now subcentimeter compared to the previous size. Stable small lesion in the right lobe of the liver. No new liver lesions evident. 2. COPD 3. Fibromyalgia 4. Chronic back pain 5. Hypothyroid 6. Depression 7. History of uterine cancer-age 9 8. Pain secondary to #1 9. Orthostatic hypotension, fatigue, dyspnea on exertion, early mucositis requiring supportive care on day 9, secondary to chemotherapy  10. Thrombocytopenia PLT 58K and neutropenia ANC 1.4 on day 9, secondary to chemotherapy 11. Worsening cytopenias, PLT 8K and ANC 0.0 on day 15, secondary to chemotherapy. Started prophylaxis with cipro on 10/22 day 11  [facility] admission 10/17/2019 -fatigue, pancytopenia,  hypokalemia 13.Left thyroid nodule with activity mildly above background blood pool activity butbelowliver activity on PET scan 09/27/2019. Thyroid ultrasound 219 2021-1.6 cm nodule left inferior thyroid gland.  5/0/5697 FNA-benign follicular nodule. 14.Tiny lung nodules noted on chest CT 09/14/2019 15.  Falls, back/leg pain and leg weakness-CT lumbar spine 05/26/2020 with no acute abnormality within the lumbar spine; multilevel degenerative spondylosis with resultant mild canal with bilateral lateral recess stenosis at L4-5; moderate bilateral L4 and L5 foraminal stenosis related to disc bulge, reactive endplate changes and facet degeneration; soft tissue density/stranding involving the presacral space anterior to the sacrum, incompletely visualized  Disposition: Joanne Klein has low back pain radiating to both legs with weakness at the left foot and right proximal leg.  She has had multiple falls.  Symptoms have been present for several weeks.  She has been referred for an MRI.  This has not been completed due to claustrophobia/need for conscious sedation.  Dr. Benay Spice spoke to Dr. Annette Stable.  We will contact the MRI department at [facility] regarding scheduling with conscious sedation ASAP. Another option would be to try an open scanner. We will pursue this as well.  We will schedule follow-up pending MRI findings.  Patient seen with Dr. Benay Spice.    Ned Card ANP/GNP-BC   06/11/2020  2:43 PM This was a shared visit with Ned Card.  Joanne Klein was interviewed and examined.  She has developed increase backslash leg pain and leg weakness.  The leg weakness has progressed over the past 4-6 weeks.  I discussed the case with Dr. Annette Stable.  We will request an expedited MRI.  The etiology of her symptoms is unclear.  I have a low clinical suspicion for recurrence of anal cancer, but this is possible.  Joanne Manson, MD

## 2020-06-12 ENCOUNTER — Telehealth: Payer: Self-pay | Admitting: *Deleted

## 2020-06-12 MED ORDER — POTASSIUM CHLORIDE CRYS ER 20 MEQ PO TBCR: 20.0000 meq | EXTENDED_RELEASE_TABLET | Freq: Every day | ORAL | 2 refills | Status: DC

## 2020-06-12 NOTE — Telephone Encounter (Signed)
Called patient with low K+ result and that she really need to get back on it daily. She agrees, but will need a refill. Also informed her that Dr. Benay Spice is taking w/radiologist to get the MRI sooner than the current 06/26/20 appointment. She expressed appreciation for Dr. Gearldine Shown and Lisa's concern for her.

## 2020-06-13 ENCOUNTER — Emergency Department (HOSPITAL_COMMUNITY)
Admission: EM | Admit: 2020-06-13 | Discharge: 2020-06-14 | Disposition: A | Discharge status: Home / Self Care | Payer: Medicare Other | Attending: Emergency Medicine | Admitting: Emergency Medicine

## 2020-06-13 ENCOUNTER — Telehealth: Payer: Self-pay | Admitting: *Deleted

## 2020-06-13 ENCOUNTER — Telehealth: Payer: Self-pay | Admitting: Nurse Practitioner

## 2020-06-13 ENCOUNTER — Other Ambulatory Visit: Payer: Self-pay

## 2020-06-13 DIAGNOSIS — I10 Essential (primary) hypertension: Secondary | ICD-10-CM | POA: Diagnosis not present

## 2020-06-13 DIAGNOSIS — Z85048 Personal history of other malignant neoplasm of rectum, rectosigmoid junction, and anus: Secondary | ICD-10-CM | POA: Diagnosis not present

## 2020-06-13 DIAGNOSIS — Z923 Personal history of irradiation: Secondary | ICD-10-CM | POA: Diagnosis not present

## 2020-06-13 DIAGNOSIS — Z8542 Personal history of malignant neoplasm of other parts of uterus: Secondary | ICD-10-CM | POA: Insufficient documentation

## 2020-06-13 DIAGNOSIS — E039 Hypothyroidism, unspecified: Secondary | ICD-10-CM | POA: Insufficient documentation

## 2020-06-13 DIAGNOSIS — Z9221 Personal history of antineoplastic chemotherapy: Secondary | ICD-10-CM | POA: Diagnosis not present

## 2020-06-13 DIAGNOSIS — R29898 Other symptoms and signs involving the musculoskeletal system: Secondary | ICD-10-CM | POA: Diagnosis not present

## 2020-06-13 DIAGNOSIS — Z87891 Personal history of nicotine dependence: Secondary | ICD-10-CM | POA: Diagnosis not present

## 2020-06-13 DIAGNOSIS — M47816 Spondylosis without myelopathy or radiculopathy, lumbar region: Secondary | ICD-10-CM | POA: Diagnosis not present

## 2020-06-13 DIAGNOSIS — R531 Weakness: Secondary | ICD-10-CM | POA: Diagnosis present

## 2020-06-13 DIAGNOSIS — M5126 Other intervertebral disc displacement, lumbar region: Secondary | ICD-10-CM | POA: Diagnosis not present

## 2020-06-13 DIAGNOSIS — Z79899 Other long term (current) drug therapy: Secondary | ICD-10-CM | POA: Insufficient documentation

## 2020-06-13 DIAGNOSIS — Z20822 Contact with and (suspected) exposure to covid-19: Secondary | ICD-10-CM | POA: Diagnosis not present

## 2020-06-13 DIAGNOSIS — M48061 Spinal stenosis, lumbar region without neurogenic claudication: Secondary | ICD-10-CM | POA: Diagnosis not present

## 2020-06-13 DIAGNOSIS — R200 Anesthesia of skin: Secondary | ICD-10-CM | POA: Diagnosis not present

## 2020-06-13 DIAGNOSIS — M5124 Other intervertebral disc displacement, thoracic region: Secondary | ICD-10-CM | POA: Diagnosis not present

## 2020-06-13 DIAGNOSIS — M5135 Other intervertebral disc degeneration, thoracolumbar region: Secondary | ICD-10-CM | POA: Diagnosis not present

## 2020-06-13 DIAGNOSIS — M6281 Muscle weakness (generalized): Secondary | ICD-10-CM | POA: Diagnosis not present

## 2020-06-13 HISTORY — DX: Fibromyalgia: M79.7

## 2020-06-13 NOTE — Telephone Encounter (Signed)
Per 6/21 los to be arranged, no changes made to patient schedule

## 2020-06-13 NOTE — Telephone Encounter (Signed)
Called to inquire regarding the MRI scan if it has been moved up? Having a lot of difficulty walking and feels by tomorrow she will not be able to walk. Alerted Dr. Benay Spice and instructed her to go to ER tonight--she needs to be admitted. Explained this to patient and the potential for paralysis if she waits too long and she agrees to have family take her to ER now.

## 2020-06-13 NOTE — ED Triage Notes (Signed)
Pt here sent by neurosurgery for admission d/t nerve damage causing pain in lower back, numbness in toes, weakness in both legs. Endorses bowel incontinence but sts this not new and is from radiation to treat anal cancer-now cancer free.

## 2020-06-14 ENCOUNTER — Emergency Department (HOSPITAL_COMMUNITY): Payer: Medicare Other | Admitting: Certified Registered"

## 2020-06-14 ENCOUNTER — Encounter (HOSPITAL_COMMUNITY)
Admission: EM | Disposition: A | Discharge status: Home / Self Care | Payer: Self-pay | Source: Home / Self Care | Attending: Emergency Medicine

## 2020-06-14 ENCOUNTER — Emergency Department (HOSPITAL_COMMUNITY): Payer: Medicare Other

## 2020-06-14 ENCOUNTER — Encounter (HOSPITAL_COMMUNITY): Payer: Self-pay | Admitting: Certified Registered"

## 2020-06-14 ENCOUNTER — Ambulatory Visit: Admit: 2020-06-14 | Payer: Medicare Other

## 2020-06-14 DIAGNOSIS — E785 Hyperlipidemia, unspecified: Secondary | ICD-10-CM | POA: Diagnosis not present

## 2020-06-14 DIAGNOSIS — R29898 Other symptoms and signs involving the musculoskeletal system: Secondary | ICD-10-CM | POA: Diagnosis not present

## 2020-06-14 DIAGNOSIS — I951 Orthostatic hypotension: Secondary | ICD-10-CM | POA: Diagnosis not present

## 2020-06-14 DIAGNOSIS — Z20822 Contact with and (suspected) exposure to covid-19: Secondary | ICD-10-CM | POA: Diagnosis not present

## 2020-06-14 DIAGNOSIS — E039 Hypothyroidism, unspecified: Secondary | ICD-10-CM | POA: Diagnosis not present

## 2020-06-14 DIAGNOSIS — J9611 Chronic respiratory failure with hypoxia: Secondary | ICD-10-CM | POA: Diagnosis not present

## 2020-06-14 DIAGNOSIS — R200 Anesthesia of skin: Secondary | ICD-10-CM | POA: Diagnosis not present

## 2020-06-14 DIAGNOSIS — M48061 Spinal stenosis, lumbar region without neurogenic claudication: Secondary | ICD-10-CM | POA: Diagnosis not present

## 2020-06-14 DIAGNOSIS — R531 Weakness: Secondary | ICD-10-CM | POA: Diagnosis not present

## 2020-06-14 DIAGNOSIS — M5135 Other intervertebral disc degeneration, thoracolumbar region: Secondary | ICD-10-CM | POA: Diagnosis not present

## 2020-06-14 DIAGNOSIS — M5124 Other intervertebral disc displacement, thoracic region: Secondary | ICD-10-CM | POA: Diagnosis not present

## 2020-06-14 DIAGNOSIS — M47816 Spondylosis without myelopathy or radiculopathy, lumbar region: Secondary | ICD-10-CM | POA: Diagnosis not present

## 2020-06-14 DIAGNOSIS — I10 Essential (primary) hypertension: Secondary | ICD-10-CM | POA: Diagnosis not present

## 2020-06-14 DIAGNOSIS — M5126 Other intervertebral disc displacement, lumbar region: Secondary | ICD-10-CM | POA: Diagnosis not present

## 2020-06-14 HISTORY — PX: RADIOLOGY WITH ANESTHESIA: SHX6223

## 2020-06-14 LAB — CBC WITH DIFFERENTIAL/PLATELET
Abs Immature Granulocytes: 0.03 10*3/uL (ref 0.00–0.07)
Basophils Absolute: 0 10*3/uL (ref 0.0–0.1)
Basophils Relative: 1 %
Eosinophils Absolute: 0.1 10*3/uL (ref 0.0–0.5)
Eosinophils Relative: 2 %
HCT: 37 % (ref 36.0–46.0)
Hemoglobin: 12.6 g/dL (ref 12.0–15.0)
Immature Granulocytes: 1 %
Lymphocytes Relative: 14 %
Lymphs Abs: 0.7 10*3/uL (ref 0.7–4.0)
MCH: 32.6 pg (ref 26.0–34.0)
MCHC: 34.1 g/dL (ref 30.0–36.0)
MCV: 95.6 fL (ref 80.0–100.0)
Monocytes Absolute: 0.6 10*3/uL (ref 0.1–1.0)
Monocytes Relative: 12 %
Neutro Abs: 3.4 10*3/uL (ref 1.7–7.7)
Neutrophils Relative %: 70 %
Platelets: 99 10*3/uL — ABNORMAL LOW (ref 150–400)
RBC: 3.87 MIL/uL (ref 3.87–5.11)
RDW: 13.5 % (ref 11.5–15.5)
WBC: 4.9 10*3/uL (ref 4.0–10.5)
nRBC: 0 % (ref 0.0–0.2)

## 2020-06-14 LAB — COMPREHENSIVE METABOLIC PANEL
ALT: 13 U/L (ref 0–44)
AST: 15 U/L (ref 15–41)
Albumin: 3.5 g/dL (ref 3.5–5.0)
Alkaline Phosphatase: 72 U/L (ref 38–126)
Anion gap: 10 (ref 5–15)
BUN: 27 mg/dL — ABNORMAL HIGH (ref 8–23)
CO2: 27 mmol/L (ref 22–32)
Calcium: 9 mg/dL (ref 8.9–10.3)
Chloride: 105 mmol/L (ref 98–111)
Creatinine, Ser: 0.95 mg/dL (ref 0.44–1.00)
GFR calc Af Amer: >60 mL/min (ref >60)
GFR calc non Af Amer: 60 mL/min — ABNORMAL LOW (ref >60)
Glucose, Bld: 116 mg/dL — ABNORMAL HIGH (ref 70–99)
Potassium: 2.9 mmol/L — ABNORMAL LOW (ref 3.5–5.1)
Sodium: 142 mmol/L (ref 135–145)
Total Bilirubin: 1.1 mg/dL (ref 0.3–1.2)
Total Protein: 6.4 g/dL — ABNORMAL LOW (ref 6.5–8.1)

## 2020-06-14 LAB — SARS CORONAVIRUS 2 BY RT PCR (HOSPITAL ORDER, PERFORMED IN ~~LOC~~ HOSPITAL LAB): SARS Coronavirus 2: NEGATIVE

## 2020-06-14 SURGERY — MRI WITH ANESTHESIA
Anesthesia: General | Laterality: Bilateral

## 2020-06-14 MED ORDER — PHENYLEPHRINE HCL-NACL 10-0.9 MG/250ML-% IV SOLN
INTRAVENOUS | Status: DC | PRN
  Administered 2020-06-14: 50 ug/min via INTRAVENOUS

## 2020-06-14 MED ORDER — HYDROMORPHONE HCL 1 MG/ML IJ SOLN: 1.0000 mg | Freq: Once | INTRAMUSCULAR | Status: DC

## 2020-06-14 MED ORDER — LACTATED RINGERS IV SOLN: INTRAVENOUS | Status: DC

## 2020-06-14 MED ORDER — FENTANYL CITRATE (PF) 100 MCG/2ML IJ SOLN
INTRAMUSCULAR | Status: AC
  Filled 2020-06-14: qty 2

## 2020-06-14 MED ORDER — PROPOFOL 10 MG/ML IV BOLUS
INTRAVENOUS | Status: DC | PRN
  Administered 2020-06-14: 120 mg via INTRAVENOUS

## 2020-06-14 MED ORDER — MIDAZOLAM HCL 2 MG/2ML IJ SOLN
INTRAMUSCULAR | Status: DC | PRN
  Administered 2020-06-14: 2 mg via INTRAVENOUS

## 2020-06-14 MED ORDER — POTASSIUM CHLORIDE 10 MEQ/100ML IV SOLN
10.0000 meq | Freq: Once | INTRAVENOUS | Status: AC
  Administered 2020-06-14: 10 meq via INTRAVENOUS
  Filled 2020-06-14 (×2): qty 100

## 2020-06-14 MED ORDER — LIDOCAINE 2% (20 MG/ML) 5 ML SYRINGE
INTRAMUSCULAR | Status: DC | PRN
  Administered 2020-06-14: 80 mg via INTRAVENOUS

## 2020-06-14 MED ORDER — DEXAMETHASONE SODIUM PHOSPHATE 10 MG/ML IJ SOLN
INTRAMUSCULAR | Status: DC | PRN
  Administered 2020-06-14: 4 mg via INTRAVENOUS

## 2020-06-14 MED ORDER — FENTANYL CITRATE (PF) 100 MCG/2ML IJ SOLN
50.0000 ug | INTRAMUSCULAR | Status: AC | PRN
  Administered 2020-06-14 (×2): 50 ug via INTRAVENOUS

## 2020-06-14 MED ORDER — FENTANYL CITRATE (PF) 250 MCG/5ML IJ SOLN
INTRAMUSCULAR | Status: DC | PRN
  Administered 2020-06-14 (×2): 50 ug via INTRAVENOUS

## 2020-06-14 MED ORDER — HYDROMORPHONE HCL 1 MG/ML IJ SOLN
0.5000 mg | Freq: Once | INTRAMUSCULAR | Status: AC
  Administered 2020-06-14: 0.5 mg via INTRAVENOUS
  Filled 2020-06-14: qty 1

## 2020-06-14 MED ORDER — CHLORHEXIDINE GLUCONATE 0.12 % MT SOLN
OROMUCOSAL | Status: AC
  Administered 2020-06-14: 15 mL
  Filled 2020-06-14: qty 15

## 2020-06-14 MED ORDER — POTASSIUM CHLORIDE CRYS ER 20 MEQ PO TBCR
40.0000 meq | EXTENDED_RELEASE_TABLET | Freq: Once | ORAL | Status: AC
  Administered 2020-06-14: 40 meq via ORAL
  Filled 2020-06-14 (×2): qty 2

## 2020-06-14 NOTE — Anesthesia Postprocedure Evaluation (Signed)
Anesthesia Post Note  Patient: Joanne Klein  Procedure(s) Performed: MRI WITH ANESTHESIA (Bilateral )     Patient location during evaluation: PACU Anesthesia Type: General Level of consciousness: awake and alert Pain management: pain level controlled Vital Signs Assessment: post-procedure vital signs reviewed and stable Respiratory status: spontaneous breathing, nonlabored ventilation and respiratory function stable Cardiovascular status: blood pressure returned to baseline and stable Postop Assessment: no apparent nausea or vomiting Anesthetic complications: no   No complications documented.  Last Vitals:  Vitals:   06/14/20 0604 06/14/20 0900  BP: (!) 108/56 (!) 153/87  Pulse: 86 76  Resp: 16   Temp: 36.8 C   SpO2: 99% 99%    Last Pain:  Vitals:   06/14/20 0604  TempSrc: Oral  PainSc:                  [name] A.

## 2020-06-14 NOTE — ED Provider Notes (Signed)
[facility] EMERGENCY DEPARTMENT Provider Note   CSN: [id number] Arrival date & time: 06/13/20  1728     History Chief Complaint  Patient presents with  . Weakness    Joanne Klein is a 74 y.o. female with a significant history including but not limited to [facility] anal cancer s/p chemoradiation therapy completed in 11/2019, uterine cancer at age 86, chemotherapy-induced thrombocytopenia, chronic opioid dependence, and morbid obesity presenting for evaluation of progressive bilateral lower extremity weakness over the past 4 to 6 weeks.  Joanne Klein recently was seen by her oncologist on 6/21 after discussion with Dr. Annette Stable, neurosurgery, who has been attempting to schedule an outpatient MRI with anesthesia, however earliest to be obtained was 7/6, thus sent to the ED yesterday afternoon.  Patient has severe claustrophobia and would not tolerate Valium or Ativan only.  Joanne Klein denies any preceding trauma or accident, however has had several falls due to this weakness including recent right lateral malleolus oblique fracture on 6/5.  It has reached a point where Joanne Klein is having difficulty even with handrails to get herself up into a standing position and cannot walk from there.  Lives alone, her friend comes in for a few hours once a week.  Reports numbness predominantly in left foot/lower leg.  Constant pain in bilateral posterior thighs and low back, Joanne Klein is unable to describe character.  Denies any fever, bowel/bladder changes, saddle anesthesia, unexpected weight loss.  Denies any upper extremity weakness or concerns.  At baseline, reports intermittent fecal incontinence due to previous radiation that has been unchanged.  Joanne Klein had a CT lumbar spine without contrast on 6/5 showing moderate bilateral L4/L5 foraminal stenosis and mild canal with bilateral lateral recess stenosis at L4-5.     Past Medical History:  Diagnosis Date  . Allergic rhinitis   . Arthritis   . Bursitis    Both  Shoulders  . Chronic low back pain   . Degenerative disk disease   . Depression   . Diverticulitis   . Emphysema of lung ([place])   . Fibromyalgia   . GERD (gastroesophageal reflux disease)   . History of fibromyalgia   . Hypertension   . Hypothyroidism   . Irritable bowel syndrome   . Neuralgia, post-herpetic   . Obesity, unspecified   . Obstructive sleep apnea   . OCD (obsessive compulsive disorder)   . Pain in joint, site unspecified   . Pure hypercholesterolemia   . Scoliosis   . Situational stress   . Spastic colon   . Tubular adenoma   . Uterine cancer [facility])     Patient Active Problem List   Diagnosis Date Noted  . Chronic respiratory failure with hypoxia ([place]) 12/15/2019  . Orthostatic hypotension 12/15/2019  . Numbness 12/15/2019  . Chemotherapy-induced neutropenia ([place])   . Thrombocytopenia ([place]) 10/17/2019  . Anal cancer ([place]) 09/15/2019  . Routine general medical examination at a health care facility 11/19/2016  . 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 Morbid obesity ([place]) 06/26/2014  . COPD GOLD II    . Hyperlipidemia   . Arthritis 01/16/2014  . Depression 01/13/2014  . Hypothyroidism 01/13/2014  . GERD (gastroesophageal reflux disease) 01/13/2014  . OCD (obsessive compulsive disorder) 01/13/2014  . Fibromyalgia 01/13/2014    Past Surgical History:  Procedure Laterality Date  . BREAST LUMPECTOMY     knot removed  . CHOLECYSTECTOMY    . HYSTEROTOMY    . TONSILLECTOMY    . tumor in left forearm  OB History   No obstetric history on file.     Family History  Problem Relation Age of Onset  . Stomach cancer Father   . Liver cancer Father   . Diabetes Father   . Colon cancer Neg Hx     Social History   Tobacco Use  . Smoking status: Former Smoker    Packs/day: 2.00    Years: 34.00    Pack years: 68.00    Types: Cigarettes    Quit date: 12/15/1996    Years since quitting: 23.5  . Smokeless tobacco: Never Used  . Tobacco comment: vapes x 2 years   Vaping Use  . Vaping Use: Every day  . Substances: Nicotine  Substance Use Topics  . Alcohol use: Not Currently    Alcohol/week: 0.0 standard drinks    Comment: Caffine Intake 2x's daily  . Drug use: No    Home Medications Prior to Admission medications   Medication Sig Start Date End Date Taking? Authorizing Provider  atorvastatin (LIPITOR) 20 MG tablet TAKE 1 TABLET BY MOUTH  EVERY OTHER DAY Patient taking differently: Take 20 mg by mouth 4 (four) times a week. Saturday, Sunday, Tuesday, and Thursday 02/13/20  Yes Hoyt Koch, MD  calcium carbonate (TUMS - DOSED IN MG ELEMENTAL CALCIUM) 500 MG chewable tablet Chew 1 tablet by mouth daily as needed.    Yes [provider]  Cholecalciferol (VITAMIN D-3) 1000 UNITS CAPS Take 1,000 Units by mouth daily.    Yes [provider]  levothyroxine (SYNTHROID) 75 MCG tablet TAKE 1 TABLET BY MOUTH  DAILY BEFORE BREAKFAST Patient taking differently: Take 75 mcg by mouth daily before breakfast.  02/07/20  Yes Hoyt Koch, MD  loperamide (IMODIUM) 2 MG capsule Take 1 capsule (2 mg total) by mouth as needed for diarrhea or loose stools. 11/16/19  Yes Lucrezia Starch, MD  omeprazole (PRILOSEC) 40 MG capsule TAKE 1 CAPSULE BY MOUTH  DAILY Patient taking differently: Take 40 mg by mouth daily.  02/07/20  Yes Hoyt Koch, MD  ondansetron (ZOFRAN) 8 MG tablet Take 8 mg by mouth every 8 (eight) hours as needed for nausea or vomiting.   Yes [provider]  oxyCODONE-acetaminophen (PERCOCET) 10-325 MG tablet Take 1 tablet by mouth every 4 (four) hours as needed for pain. Gets from another provider @@ 180 per month   Yes [provider]  Polyethylene Glycol 3350 (MIRALAX PO) Take 17 g by mouth at bedtime.    Yes [provider]  potassium chloride SA (KLOR-CON) 20 MEQ tablet Take 1 tablet (20 mEq total) by mouth daily. 06/12/20  Yes Ladell Pier, MD  prochlorperazine (COMPAZINE) 10 MG  tablet Take 10 mg by mouth every 6 (six) hours as needed for nausea or vomiting.   Yes [provider]  sertraline (ZOLOFT) 100 MG tablet TAKE 2 TABLETS BY MOUTH AT  BEDTIME Patient taking differently: Take 200 mg by mouth every evening.  02/07/20  Yes Hoyt Koch, MD  phenylephrine-shark liver oil-mineral oil-petrolatum (PREPARATION H) 0.25-3-14-71.9 % rectal ointment Place 1 application rectally 2 (two) times daily as needed for hemorrhoids.    [provider]    Allergies    Prozac [fluoxetine hcl], Codeine, Morphine and related, and Sulfa antibiotics  Review of Systems   Review of Systems  Constitutional: Negative for chills, fever and unexpected weight change.  Respiratory: Negative for chest tightness and shortness of breath.   Cardiovascular: Positive for leg swelling. Negative  for chest pain and palpitations.  Gastrointestinal: Negative for abdominal pain, constipation, diarrhea, nausea, rectal pain and vomiting.  Genitourinary: Negative for decreased urine volume, difficulty urinating and pelvic pain.  Musculoskeletal: Positive for arthralgias, back pain, gait problem and myalgias. Negative for neck pain and neck stiffness.  Neurological: Positive for weakness and numbness. Negative for dizziness and light-headedness.    Physical Exam Updated Vital Signs BP (!) 153/87   Pulse 76   Temp 98.2 F (36.8 C) (Oral)   Resp 16   Ht 5\' 6"  (1.676 m)   Wt 102.1 kg   LMP  (LMP Unknown)   SpO2 99%   BMI 36.32 kg/m   Physical Exam Constitutional:      General: Joanne Klein is not in acute distress.    Appearance: Normal appearance. Joanne Klein is not toxic-appearing.  HENT:     Head: Normocephalic and atraumatic.     Mouth/Throat:     Mouth: Mucous membranes are moist.  Eyes:     Extraocular Movements: Extraocular movements intact.  Pulmonary:     Effort: Pulmonary effort is normal.  Abdominal:     Palpations: Abdomen is soft.     Tenderness: There is no abdominal  tenderness.  Musculoskeletal:     Cervical back: Normal range of motion.     Comments: Lumbar spine: - Inspection: no gross deformity or asymmetry, swelling or ecchymosis - Palpation: TTP over the L4/L5 spinous processes, lumbar paraspinal muscles, and posterior thighs bilaterally - ROM: full passive ROM of the bilateral lower extremity  - Strength: Weak 4/5 strength of lower extremity bilaterally with the exception of 3/5 strength in left foot dorsiflexion.  Can bear weight on bilateral legs for short period of time, unable to stay steady to walk without assistance - Neuro: sensation intact in the L4-S1 nerve root distribution b/l, with exception of decreased along lateral left lower leg and lateral/mid foot including hallux, diminished L4 reflex on right, 2/4 left-sided patellar reflex.  Difficulty to obtain S1 reflexes bilaterally.  Skin:    General: Skin is warm and dry.     Findings: Bruising present.     Comments: Scattered ecchymoses throughout bilateral lower extremity.  Mild nonpitting soft tissue swelling around ankle on right.  Neurological:     Mental Status: Joanne Klein is alert and oriented to person, place, and time.     Sensory: Sensory deficit present.     Motor: Weakness present.     Deep Tendon Reflexes: Reflexes abnormal.  Psychiatric:        Mood and Affect: Mood normal.        Behavior: Behavior normal.     ED Results / Procedures / Treatments   Labs (all labs ordered are listed, but only abnormal results are displayed) Labs Reviewed  CBC WITH DIFFERENTIAL/PLATELET - Abnormal; Notable for the following components:      Result Value   Platelets 99 (*)    All other components within normal limits  COMPREHENSIVE METABOLIC PANEL - Abnormal; Notable for the following components:   Potassium 2.9 (*)    Glucose, Bld 116 (*)    BUN 27 (*)    Total Protein 6.4 (*)    GFR calc non Af Amer 60 (*)    All other components within normal limits  SARS CORONAVIRUS 2 BY RT PCR  (HOSPITAL ORDER, [facility])    EKG None  Radiology No results found.  Procedures Procedures (including critical care time)  Medications Ordered in ED Medications  HYDROmorphone (DILAUDID) injection 1 mg ( Intravenous MAR Hold 06/14/20 1344)  lactated ringers infusion ( Intravenous New Bag/Given 06/14/20 1420)  HYDROmorphone (DILAUDID) injection 0.5 mg (0.5 mg Intravenous Given 06/14/20 1021)  chlorhexidine (PERIDEX) 0.12 % solution (15 mLs  Given 06/14/20 1419)    ED Course  I have reviewed the triage vital signs and the nursing notes.  Pertinent labs & imaging results that were available during my care of the patient were reviewed by me and considered in my medical decision making (see chart for details).    MDM Rules/Calculators/A&P                         74 year old female with a history of anal squamous cell carcinoma s/p chemoradiation completed in 11/2019, chronic opioid dependence, and morbid obesity presenting with low back pain associated with progressive bilateral lower extremity weakness and left-sided foot drop over the past several weeks.  Sent by her oncologist for MRI under anesthesia given severe claustrophobia.   Exam is consistent with reported complaints, 4/5 strength throughout lower extremity (3/5 with left dorsiflexion), decreased sensation along lateral left lower leg and foot, and abnormal DTRs suggesting likely L4-S1 involvement. Compressive neurogenic syndrome via anal cancer recurrence is possible but unlikely. No associated bowel/bladder dysfunction or saddle anesthesia present, however central cord involvement is not ruled out.   Scheduled for MRI w and w/o contrast of thoracic and lumbar spine with anesthesia this afternoon.  Results pending, signout and care transferred to [place], [place].  Will need potassium supplementation upon return to ED due to hyperkalemia of 2.9, otherwise additional CBC and CMP around baseline.  Disposition  pending MRI results, may need admission with neurosurgery consultation.   Final Clinical Impression(s) / ED Diagnoses Final diagnoses:  Bilateral leg weakness    Rx / DC Orders ED Discharge Orders    None       Patriciaann Clan, DO 06/14/20 1530    Gareth Morgan, MD 06/16/20 1428

## 2020-06-14 NOTE — Anesthesia Postprocedure Evaluation (Signed)
Anesthesia Post Note  Patient: Joanne Klein  Procedure(s) Performed: MRI WITH ANESTHESIA (Bilateral )     Anesthesia Type: General   No complications documented.  Last Vitals:  Vitals:   06/14/20 1736 06/14/20 1754  BP:  (!) 171/130  Pulse: 97 91  Resp:  (!) 24  Temp: 36.6 C 36.6 C  SpO2:  96%    Last Pain:  Vitals:   06/14/20 0604  TempSrc: Oral  PainSc:                  Sourish Allender A.

## 2020-06-14 NOTE — Discharge Instructions (Addendum)
You have been seen today for leg weakness. Please read and follow all provided instructions. Return to the emergency room for worsening condition or new concerning symptoms.    -A referral was ordered for home health assistance.  You should be contacted within 24 to 48 hours.  1. Medications:  Continue your potassium pills. Start taking them tomorrow.  Continue usual home medications Take medications as prescribed. Please review all of the medicines and only take them if you do not have an allergy to them.   2. Treatment: rest, drink plenty of fluids  3. Follow Up:  Please follow up with primary care provider by scheduling an appointment as soon as possible for a visit  -You will need to have your potassium rechecked by your primary care doctor.   ?

## 2020-06-14 NOTE — Transfer of Care (Signed)
Immediate Anesthesia Transfer of Care Note  Patient: Joanne Klein  Procedure(s) Performed: MRI WITH ANESTHESIA (Bilateral )  Patient Location: PACU  Anesthesia Type:General  Level of Consciousness: awake, alert  and oriented  Airway & Oxygen Therapy: Patient Spontanous Breathing  Post-op Assessment: Report given to RN and Patient moving all extremities X 4  Post vital signs: Reviewed and stable  Last Vitals:  Vitals Value Taken Time  BP    Temp    Pulse 94 06/14/20 1737  Resp 20 06/14/20 1737  SpO2 93 % 06/14/20 1737  Vitals shown include unvalidated device data.  Last Pain:  Vitals:   06/14/20 0604  TempSrc: Oral  PainSc:          Complications: No complications documented.

## 2020-06-14 NOTE — ED Provider Notes (Signed)
Care assumed from resident Dr. Higinio Plan at shift change pending MRI.  See her note for full H&P.   Briefly this is a 74 year old female presenting to emergency department with chief complaint of lateral lower extremity weakness x4 to 6 weeks.  Patient was supposed to have in MRI outpatient however came to the emergency department today for the MRI.  Patient has severe claustrophobia and MRI was performed under sedation.  Patient tolerated well and had no complications while in the PACU.  Labs today were notable for hypokalemia of 2.9 and a lately count of 99 which is consistent with her baseline.  Physical Exam  BP (!) (P) 169/76   Pulse 83   Temp 97.9 F (36.6 C)   Resp (!) 24   Ht 5\' 6"  (1.676 m)   Wt 102.1 kg   LMP  (LMP Unknown)   SpO2 93%   BMI 36.32 kg/m   Physical Exam  PE: Constitutional: well-developed, well-nourished, no apparent distress HENT: normocephalic, atraumatic. no cervical adenopathy Cardiovascular: normal rate and rhythm, distal pulses intact Pulmonary/Chest: effort normal; breath sounds clear and equal bilaterally; no wheezes or rales Abdominal: soft and nontender Musculoskeletal: full ROM. Bilateral lower extremity edema 4/5 bilaterally. Patient ambulates with steady gait and minimal stand by assistance. Neurological: alert with goal directed thinking Skin: warm and dry, no rash, no diaphoresis Psychiatric: normal mood and affect, normal behavior    ED Course/Procedures     Results for orders placed or performed during the hospital encounter of 06/13/20 (from the past 24 hour(s))  SARS Coronavirus 2 by RT PCR (hospital order, performed in [place] hospital lab) Nasopharyngeal Nasopharyngeal Swab     Status: None   Collection Time: 06/14/20 11:41 AM   Specimen: Nasopharyngeal Swab  Result Value Ref Range   SARS Coronavirus 2 NEGATIVE NEGATIVE  CBC with Differential     Status: Abnormal   Collection Time: 06/14/20  1:10 PM  Result Value Ref Range   WBC  4.9 4.0 - 10.5 K/uL   RBC 3.87 3.87 - 5.11 MIL/uL   Hemoglobin 12.6 12.0 - 15.0 g/dL   HCT 37.0 36 - 46 %   MCV 95.6 80.0 - 100.0 fL   MCH 32.6 26.0 - 34.0 pg   MCHC 34.1 30.0 - 36.0 g/dL   RDW 13.5 11.5 - 15.5 %   Platelets 99 (L) 150 - 400 K/uL   nRBC 0.0 0.0 - 0.2 %   Neutrophils Relative % 70 %   Neutro Abs 3.4 1.7 - 7.7 K/uL   Lymphocytes Relative 14 %   Lymphs Abs 0.7 0.7 - 4.0 K/uL   Monocytes Relative 12 %   Monocytes Absolute 0.6 0 - 1 K/uL   Eosinophils Relative 2 %   Eosinophils Absolute 0.1 0 - 0 K/uL   Basophils Relative 1 %   Basophils Absolute 0.0 0 - 0 K/uL   Immature Granulocytes 1 %   Abs Immature Granulocytes 0.03 0.00 - 0.07 K/uL  Comprehensive metabolic panel     Status: Abnormal   Collection Time: 06/14/20  1:10 PM  Result Value Ref Range   Sodium 142 135 - 145 mmol/L   Potassium 2.9 (L) 3.5 - 5.1 mmol/L   Chloride 105 98 - 111 mmol/L   CO2 27 22 - 32 mmol/L   Glucose, Bld 116 (H) 70 - 99 mg/dL   BUN 27 (H) 8 - 23 mg/dL   Creatinine, Ser 0.95 0.44 - 1.00 mg/dL   Calcium 9.0 8.9 -  10.3 mg/dL   Total Protein 6.4 (L) 6.5 - 8.1 g/dL   Albumin 3.5 3.5 - 5.0 g/dL   AST 15 15 - 41 U/L   ALT 13 0 - 44 U/L   Alkaline Phosphatase 72 38 - 126 U/L   Total Bilirubin 1.1 0.3 - 1.2 mg/dL   GFR calc non Af Amer 60 (L) >60 mL/min   GFR calc Af Amer >60 >60 mL/min   Anion gap 10 5 - 15   EXAM:  MRI THORACIC AND LUMBAR SPINE WITHOUT CONTRAST    TECHNIQUE:  Multiplanar and multiecho pulse sequences of the thoracic and lumbar  spine were obtained without intravenous contrast.    COMPARISON: CT lumbar spine 05/26/2020    FINDINGS:  MRI THORACIC SPINE FINDINGS    Alignment: Normal. Mild thoracic kyphosis.    Vertebrae: Negative for fracture or mass. No bone marrow edema.    Cord: Normal signal and morphology. No cord compression.    Paraspinal and other soft tissues: Common bile duct 11.5 mm.  Intrahepatic ductal dilatation. This is likely due  to postop  cholecystectomy. Correlate with liver function test.    Disc levels:    Small right-sided disc protrusion T11-12 without significant spinal  or foraminal stenosis. Disc degeneration with endplate spurring at  K53-Z7 without stenosis. Otherwise no significant degenerative  change in the thoracic spine.    MRI LUMBAR SPINE FINDINGS    Segmentation: Normal    Alignment: Normal    Vertebrae: Negative for fracture or mass. No bone marrow edema.    Fatty changes in the bone marrow at L5 and the sacrum compatible  with prior pelvic radiation.    Conus medullaris and cauda equina: Conus extends to the T12-L1  level. Conus and cauda equina appear normal.    Paraspinal and other soft tissues: Mild biliary dilatation as  described above. No paraspinous mass or adenopathy.    Disc levels:    L1-2: Disc bulging and mild endplate spurring. No significant  stenosis.    L2-3: Mild disc bulging and mild facet degeneration. Negative for  stenosis    L3-4: Diffuse bulging of the disc with shallow central disc  protrusion. Mild facet hypertrophy. Negative for spinal or foraminal  stenosis.    L4-5: Disc degeneration with disc bulging and mild spurring. Mild  facet degeneration. Mild to moderate left subarticular stenosis.  Spinal canal mildly narrowed without significant stenosis.    L5-S1: Mild disc degeneration and disc bulging. Negative for neural  impingement or stenosis. Mild facet degeneration bilaterally.    IMPRESSION:  MR THORACIC SPINE IMPRESSION    Disc degeneration at T11-T12 and T12-L1. Small right-sided disc  protrusion at T11-12. No significant stenosis. Negative for lumbar  fracture.    Biliary dilatation likely related to cholecystectomy. Correlate with  liver function test.    MR LUMBAR SPINE IMPRESSION    Multilevel mild degenerative changes in the lumbar spine similar to  the recent CT. Mild to moderate subarticular  stenosis on the left at  L4-5. Negative for fracture or mass.      Electronically Signed  By: Franchot Gallo M.D.  On: 06/14/2020 18:27     MDM   Patient received in sign out. Please see previous note to include MDM up to this point.  Patient returns to ED from PACU at [place].   MRIs viewed with ED attending Dr. Melina Copa which are negative for acute abnormalities.  Patient is able to ambulate to the restroom with minimal assistance.  She has steady gait.  Updated patient on results. Repleted potassium with PO.  Patient does not wish to have IV potassium as she has prescription for p.o. potassium at home already.  I advised her that her potassium was very low today and she should receive both p.o. and IV however she continues to refuse IV.    Discussed with case management who evaluated patient at the bedside.  Home health including PT OT and nursing orders placed.  Talking with patient it was made clear that she is requesting assistance with food preparation and housecleaning.  We advised patient that home health would include physical therapy and nursing needs.  Patient states she does not need this.  She kindly refuses.  I asked patient if I could discuss her work-up with her daughter and patient also refused.  Patient is of sound mind and able to make her own decisions.  She feels that she can be safely discharged home.  Her niece will pick her up and stay with her for at least the next 24 hours.  Patient aware she will need to have her potassium rechecked by primary care doctor as it was low today. Patient is stable to be discharged home.  Very strict return precautions discussed.  Patient has no further questions.   The patient appears reasonably screened and/or stabilized for discharge and I doubt any other medical condition or other [facility] requiring further screening, evaluation, or treatment in the ED at this time prior to discharge.     Portions of this note were generated with Geographical information systems officer. Dictation errors may occur despite best attempts at proofreading.     Flint Melter 06/14/20 2135    Hayden Rasmussen, MD 06/15/20 1012

## 2020-06-14 NOTE — Anesthesia Preprocedure Evaluation (Addendum)
Anesthesia Evaluation  Patient identified by MRN, date of birth, ID band Patient awake    Reviewed: Allergy & Precautions, NPO status , Patient's Chart, lab work & pertinent test results, reviewed documented beta blocker date and time   Airway Mallampati: II  TM Distance: >3 FB Neck ROM: Full    Dental  (+) Poor Dentition, Missing, Caps,    Pulmonary sleep apnea and Continuous Positive Airway Pressure Ventilation , COPD,  COPD inhaler, former smoker,    Pulmonary exam normal breath sounds clear to auscultation       Cardiovascular hypertension, Pt. on medications Normal cardiovascular exam Rhythm:Regular Rate:Normal     Neuro/Psych PSYCHIATRIC DISORDERS Anxiety Depression Claustrophobia OCDHx/o post herpetic neuralgia  Neuromuscular disease    GI/Hepatic Neg liver ROS, GERD  Medicated and Controlled,Hx/o anal Ca S/P ChemoRx completed 12/20 IBS   Endo/Other  Hypothyroidism Morbid obesityHypercholesterolemia  Renal/GU negative Renal ROSHypokalemia  negative genitourinary   Musculoskeletal  (+) Arthritis , Osteoarthritis,  Fibromyalgia -  Abdominal (+) + obese,   Peds  Hematology Thrombocytopenia- ChemoRx induced   Anesthesia Other Findings   Reproductive/Obstetrics Hx/o uterine Ca                            Anesthesia Physical Anesthesia Plan  ASA: III  Anesthesia Plan: General   Post-op Pain Management:    Induction: Intravenous  PONV Risk Score and Plan: 3 and Ondansetron and Treatment may vary due to age or medical condition  Airway Management Planned: LMA  Additional Equipment:   Intra-op Plan:   Post-operative Plan: Extubation in OR  Informed Consent: I have reviewed the patients History and Physical, chart, labs and discussed the procedure including the risks, benefits and alternatives for the proposed anesthesia with the patient or authorized representative who has  indicated his/her understanding and acceptance.     Dental advisory given  Plan Discussed with: CRNA  Anesthesia Plan Comments:         Anesthesia Quick Evaluation

## 2020-06-14 NOTE — Anesthesia Procedure Notes (Signed)
Procedure Name: LMA Insertion Date/Time: 06/14/2020 3:11 PM Performed by: Barrington Ellison, CRNA Pre-anesthesia Checklist: Patient identified, Emergency Drugs available, Suction available and Patient being monitored Patient Re-evaluated:Patient Re-evaluated prior to induction Oxygen Delivery Method: Circle System Utilized Preoxygenation: Pre-oxygenation with 100% oxygen Induction Type: IV induction Ventilation: Mask ventilation without difficulty LMA: LMA inserted LMA Size: 4.0 Number of attempts: 1 Placement Confirmation: positive ETCO2 Tube secured with: Tape Dental Injury: Teeth and Oropharynx as per pre-operative assessment

## 2020-06-14 NOTE — H&P (Signed)
Subjective:  Joanne Klein is a 74 y.o. female with a significant history including but not limited to [facility] anal cancer s/p chemoradiation therapy completed in 11/2019, uterine cancer at age 61, chemotherapy-induced thrombocytopenia, chronic opioid dependence, and morbid obesity presenting for evaluation of progressive bilateral lower extremity weakness over the past 4 to 6 weeks.  She recently was seen by her oncologist on 6/21 who has been attempting to schedule an outpatient MRI with anesthesia, however earliest to be obtained was 7/6, thus sent to the ED yesterday afternoon.  Patient has severe claustrophobia and would not tolerate Valium or Ativan only.  She denies any preceding trauma or accident, however has had several falls due to this weakness including recent right lateral malleolus oblique fracture on 6/5.  It has reached a point where she is having difficulty even with handrails to get herself up into a standing position and cannot walk from there.  Lives alone, her friend comes in for a few hours once a week.  Reports numbness predominantly in left foot/lower leg.  Constant pain in bilateral posterior thighs and low back, she is unable to describe character.  Denies any fever, bowel/bladder changes, saddle anesthesia, unexpected weight loss.  Denies any upper extremity weakness or concerns.  At baseline, reports intermittent fecal incontinence due to previous radiation that has been unchanged.  She had a CT lumbar spine without contrast on 6/5 showing moderate bilateral L4/L5 foraminal stenosis and mild canal with bilateral lateral recess stenosis at L4-5.  Patient Active Problem List   Diagnosis Date Noted  . Chronic respiratory failure with hypoxia ([place]) 12/15/2019  . Orthostatic hypotension 12/15/2019  . Numbness 12/15/2019  . Chemotherapy-induced neutropenia ([place])   . Thrombocytopenia ([place]) 10/17/2019  . Anal cancer ([place]) 09/15/2019  . Routine general medical examination at a  health care facility 11/19/2016  . 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 Morbid obesity ([place]) 06/26/2014  . COPD GOLD II    . Hyperlipidemia   . Arthritis 01/16/2014  . Depression 01/13/2014  . Hypothyroidism 01/13/2014  . GERD (gastroesophageal reflux disease) 01/13/2014  . OCD (obsessive compulsive disorder) 01/13/2014  . Fibromyalgia 01/13/2014   Past Medical History:  Diagnosis Date  . Allergic rhinitis   . Arthritis   . Bursitis    Both Shoulders  . Chronic low back pain   . Degenerative disk disease   . Depression   . Diverticulitis   . Emphysema of lung ([place])   . GERD (gastroesophageal reflux disease)   . History of fibromyalgia   . Hypertension   . Hypothyroidism   . Irritable bowel syndrome   . Neuralgia, post-herpetic   . Obesity, unspecified   . Obstructive sleep apnea   . OCD (obsessive compulsive disorder)   . Pain in joint, site unspecified   . Pure hypercholesterolemia   . Scoliosis   . Situational stress   . Spastic colon   . Tubular adenoma   . Uterine cancer [facility])     Past Surgical History:  Procedure Laterality Date  . BREAST LUMPECTOMY     knot removed  . CHOLECYSTECTOMY    . HYSTEROTOMY    . TONSILLECTOMY    . tumor in left forearm      (Not in a hospital admission)  Allergies  Allergen Reactions  . Prozac [Fluoxetine Hcl] Anaphylaxis  . Codeine Itching  . Morphine And Related Itching  . Sulfa Antibiotics Itching and Nausea Only    Social History   Tobacco Use  . Smoking status:  Former Smoker    Packs/day: 2.00    Years: 34.00    Pack years: 68.00    Types: Cigarettes    Quit date: 12/15/1996    Years since quitting: 23.5  . Smokeless tobacco: Never Used  . Tobacco comment: vapes x 2 years  Substance Use Topics  . Alcohol use: No    Alcohol/week: 0.0 standard drinks    Comment: Caffine Intake 2x's daily    Family History  Problem Relation Age of Onset  . Stomach cancer Father   . Liver cancer Father   . Diabetes Father   . Colon  cancer Neg Hx     Review of Systems Pertinent items noted in HPI and remainder of comprehensive ROS otherwise negative.  Objective:   Patient Vitals for the past 8 hrs:  BP Temp Temp src Pulse Resp SpO2  06/14/20 0900 (!) 153/87 -- -- 76 -- 99 %  06/14/20 0604 (!) 108/56 98.2 F (36.8 C) Oral 86 16 99 %   Physical Exam Constitutional:      General: She is not in acute distress.    Appearance: Normal appearance. She is not toxic-appearing.  HENT:     Head: Normocephalic and atraumatic.     Mouth/Throat:     Mouth: Mucous membranes are moist.  Eyes:     Extraocular Movements: Extraocular movements intact.  Pulmonary:     Effort: Pulmonary effort is normal.  Abdominal:     Palpations: Abdomen is soft.     Tenderness: There is no abdominal tenderness.  Musculoskeletal:     Cervical back: Normal range of motion.     Comments: Lumbar spine: - Inspection: no gross deformity or asymmetry, swelling or ecchymosis - Palpation: TTP over the L4/L5 spinous processes, lumbar paraspinal muscles, and posterior thighs bilaterally - ROM: full passive ROM of the bilateral lower extremity  - Strength: Weak 4/5 strength of lower extremity bilaterally with the exception of 3/5 strength in left foot dorsiflexion.  Can bear weight on bilateral legs for short period of time, unable to stay steady to walk without assistance - Neuro: sensation intact in the L4-S1 nerve root distribution b/l, with exception of decreased along lateral left lower leg and lateral/mid foot including hallux, diminished L4 reflex on right, 2/4 left-sided patellar reflex.  Difficulty to obtain S1 reflexes bilaterally.  Skin:    General: Skin is warm and dry.     Findings: Bruising present.     Comments: Scattered ecchymoses throughout bilateral lower extremity.  Mild nonpitting soft tissue swelling around ankle on right.  Neurological:     Mental Status: She is alert and oriented to person, place, and time.     Sensory:  Sensory deficit present.     Motor: Weakness present.     Deep Tendon Reflexes: Reflexes abnormal.  Psychiatric:        Mood and Affect: Mood normal.        Behavior: Behavior normal.   Assessment:  Progressive bilateral lower extremity objective weakness, with focal neurological deficit Severe claustrophobia requiring MRI sedation History of anal cancer s/p chemoradiation Chronic opioid dependence  Plan:   Will obtain MRI with/without contrast of thoracic and lumbar spine with anesthesia due to severe claustrophobia.  Disposition pending results.  Patriciaann Clan, DO

## 2020-06-15 ENCOUNTER — Other Ambulatory Visit: Payer: Self-pay | Admitting: *Deleted

## 2020-06-15 ENCOUNTER — Encounter (HOSPITAL_COMMUNITY): Payer: Self-pay | Admitting: Radiology

## 2020-06-15 ENCOUNTER — Telehealth: Payer: Self-pay | Admitting: *Deleted

## 2020-06-15 ENCOUNTER — Telehealth: Payer: Self-pay | Admitting: Oncology

## 2020-06-15 DIAGNOSIS — C210 Malignant neoplasm of anus, unspecified: Secondary | ICD-10-CM

## 2020-06-15 DIAGNOSIS — E876 Hypokalemia: Secondary | ICD-10-CM

## 2020-06-15 NOTE — Telephone Encounter (Signed)
Scheduled per sch msg. Called and left msg  

## 2020-06-15 NOTE — Telephone Encounter (Signed)
Left VM for patient regarding her ER visit and MRI scan. Dr. Benay Spice is trying to reach Dr. Trenton Gammon to be sure he sees results--she needs f/u with neuro. Encouraged her to take her KCL daily since it was very low yesterday, which can contribute to her weakness. Requested she call if she needs [place] today.

## 2020-06-15 NOTE — Care Management (Signed)
ED CM consulted concerning HH recommendations. CM met with patient at bedside to discuss the recommendations for [facility] however patient declined stated that she has had it in the past she is looking for someone to do housework and cooking. CM explained that her insurance covers skilled care she reiterates she is aware but that is not what she is looking for.  CM updated EDP and ED RN

## 2020-06-19 ENCOUNTER — Other Ambulatory Visit: Payer: Self-pay

## 2020-06-19 ENCOUNTER — Encounter: Payer: Self-pay | Admitting: *Deleted

## 2020-06-19 ENCOUNTER — Inpatient Hospital Stay: Payer: Medicare Other

## 2020-06-19 ENCOUNTER — Inpatient Hospital Stay (HOSPITAL_COMMUNITY)
Admission: AD | Admit: 2020-06-19 | Discharge: 2020-06-23 | DRG: 556 | Disposition: A | Discharge status: Home Health | Payer: Medicare Other | Source: Ambulatory Visit | Attending: Internal Medicine | Admitting: Internal Medicine

## 2020-06-19 ENCOUNTER — Inpatient Hospital Stay (HOSPITAL_COMMUNITY): Payer: Medicare Other

## 2020-06-19 ENCOUNTER — Encounter (HOSPITAL_COMMUNITY): Payer: Self-pay | Admitting: Internal Medicine

## 2020-06-19 ENCOUNTER — Inpatient Hospital Stay: Payer: Medicare Other | Admitting: Oncology

## 2020-06-19 VITALS — BP 149/99 | HR 88 | Temp 98.2°F | Resp 17 | Ht 66.0 in

## 2020-06-19 DIAGNOSIS — M5416 Radiculopathy, lumbar region: Secondary | ICD-10-CM

## 2020-06-19 DIAGNOSIS — D6959 Other secondary thrombocytopenia: Secondary | ICD-10-CM | POA: Diagnosis not present

## 2020-06-19 DIAGNOSIS — M5116 Intervertebral disc disorders with radiculopathy, lumbar region: Secondary | ICD-10-CM | POA: Diagnosis not present

## 2020-06-19 DIAGNOSIS — R200 Anesthesia of skin: Secondary | ICD-10-CM

## 2020-06-19 DIAGNOSIS — G8929 Other chronic pain: Secondary | ICD-10-CM | POA: Diagnosis not present

## 2020-06-19 DIAGNOSIS — Z79899 Other long term (current) drug therapy: Secondary | ICD-10-CM | POA: Diagnosis not present

## 2020-06-19 DIAGNOSIS — Z85048 Personal history of other malignant neoplasm of rectum, rectosigmoid junction, and anus: Secondary | ICD-10-CM

## 2020-06-19 DIAGNOSIS — G4733 Obstructive sleep apnea (adult) (pediatric): Secondary | ICD-10-CM | POA: Diagnosis present

## 2020-06-19 DIAGNOSIS — F418 Other specified anxiety disorders: Secondary | ICD-10-CM | POA: Diagnosis present

## 2020-06-19 DIAGNOSIS — E669 Obesity, unspecified: Secondary | ICD-10-CM | POA: Diagnosis present

## 2020-06-19 DIAGNOSIS — M797 Fibromyalgia: Secondary | ICD-10-CM | POA: Diagnosis not present

## 2020-06-19 DIAGNOSIS — F40240 Claustrophobia: Secondary | ICD-10-CM | POA: Diagnosis present

## 2020-06-19 DIAGNOSIS — R202 Paresthesia of skin: Secondary | ICD-10-CM | POA: Diagnosis not present

## 2020-06-19 DIAGNOSIS — M25569 Pain in unspecified knee: Secondary | ICD-10-CM | POA: Diagnosis not present

## 2020-06-19 DIAGNOSIS — M60851 Other myositis, right thigh: Secondary | ICD-10-CM | POA: Diagnosis not present

## 2020-06-19 DIAGNOSIS — E039 Hypothyroidism, unspecified: Secondary | ICD-10-CM | POA: Diagnosis present

## 2020-06-19 DIAGNOSIS — J439 Emphysema, unspecified: Secondary | ICD-10-CM | POA: Diagnosis present

## 2020-06-19 DIAGNOSIS — C210 Malignant neoplasm of anus, unspecified: Secondary | ICD-10-CM | POA: Diagnosis not present

## 2020-06-19 DIAGNOSIS — M60852 Other myositis, left thigh: Principal | ICD-10-CM | POA: Diagnosis present

## 2020-06-19 DIAGNOSIS — K5900 Constipation, unspecified: Secondary | ICD-10-CM | POA: Diagnosis not present

## 2020-06-19 DIAGNOSIS — R52 Pain, unspecified: Secondary | ICD-10-CM

## 2020-06-19 DIAGNOSIS — R160 Hepatomegaly, not elsewhere classified: Secondary | ICD-10-CM | POA: Diagnosis not present

## 2020-06-19 DIAGNOSIS — E876 Hypokalemia: Secondary | ICD-10-CM

## 2020-06-19 DIAGNOSIS — M4726 Other spondylosis with radiculopathy, lumbar region: Secondary | ICD-10-CM | POA: Diagnosis not present

## 2020-06-19 DIAGNOSIS — Z20822 Contact with and (suspected) exposure to covid-19: Secondary | ICD-10-CM | POA: Diagnosis present

## 2020-06-19 DIAGNOSIS — R296 Repeated falls: Secondary | ICD-10-CM

## 2020-06-19 DIAGNOSIS — E7800 Pure hypercholesterolemia, unspecified: Secondary | ICD-10-CM | POA: Diagnosis not present

## 2020-06-19 DIAGNOSIS — Z9181 History of falling: Secondary | ICD-10-CM | POA: Diagnosis not present

## 2020-06-19 DIAGNOSIS — E538 Deficiency of other specified B group vitamins: Secondary | ICD-10-CM | POA: Diagnosis not present

## 2020-06-19 DIAGNOSIS — R0609 Other forms of dyspnea: Secondary | ICD-10-CM | POA: Diagnosis not present

## 2020-06-19 DIAGNOSIS — Z66 Do not resuscitate: Secondary | ICD-10-CM | POA: Diagnosis present

## 2020-06-19 DIAGNOSIS — Z833 Family history of diabetes mellitus: Secondary | ICD-10-CM

## 2020-06-19 DIAGNOSIS — E785 Hyperlipidemia, unspecified: Secondary | ICD-10-CM | POA: Diagnosis present

## 2020-06-19 DIAGNOSIS — F329 Major depressive disorder, single episode, unspecified: Secondary | ICD-10-CM | POA: Diagnosis present

## 2020-06-19 DIAGNOSIS — F32A Depression, unspecified: Secondary | ICD-10-CM | POA: Diagnosis present

## 2020-06-19 DIAGNOSIS — R531 Weakness: Secondary | ICD-10-CM

## 2020-06-19 DIAGNOSIS — I10 Essential (primary) hypertension: Secondary | ICD-10-CM | POA: Diagnosis not present

## 2020-06-19 DIAGNOSIS — Z885 Allergy status to narcotic agent status: Secondary | ICD-10-CM

## 2020-06-19 DIAGNOSIS — Y842 Radiological procedure and radiotherapy as the cause of abnormal reaction of the patient, or of later complication, without mention of misadventure at the time of the procedure: Secondary | ICD-10-CM | POA: Diagnosis present

## 2020-06-19 DIAGNOSIS — Z6836 Body mass index (BMI) 36.0-36.9, adult: Secondary | ICD-10-CM | POA: Diagnosis not present

## 2020-06-19 DIAGNOSIS — I951 Orthostatic hypotension: Secondary | ICD-10-CM | POA: Diagnosis not present

## 2020-06-19 DIAGNOSIS — Z9221 Personal history of antineoplastic chemotherapy: Secondary | ICD-10-CM

## 2020-06-19 DIAGNOSIS — Z79890 Hormone replacement therapy (postmenopausal): Secondary | ICD-10-CM

## 2020-06-19 DIAGNOSIS — R590 Localized enlarged lymph nodes: Secondary | ICD-10-CM | POA: Diagnosis not present

## 2020-06-19 DIAGNOSIS — R29818 Other symptoms and signs involving the nervous system: Secondary | ICD-10-CM | POA: Diagnosis not present

## 2020-06-19 DIAGNOSIS — Z79891 Long term (current) use of opiate analgesic: Secondary | ICD-10-CM

## 2020-06-19 DIAGNOSIS — Z800 Family history of malignant neoplasm of digestive organs: Secondary | ICD-10-CM

## 2020-06-19 DIAGNOSIS — Z882 Allergy status to sulfonamides status: Secondary | ICD-10-CM

## 2020-06-19 DIAGNOSIS — R29898 Other symptoms and signs involving the musculoskeletal system: Secondary | ICD-10-CM | POA: Diagnosis not present

## 2020-06-19 DIAGNOSIS — M609 Myositis, unspecified: Secondary | ICD-10-CM | POA: Diagnosis not present

## 2020-06-19 DIAGNOSIS — M549 Dorsalgia, unspecified: Secondary | ICD-10-CM | POA: Diagnosis not present

## 2020-06-19 DIAGNOSIS — Z8542 Personal history of malignant neoplasm of other parts of uterus: Secondary | ICD-10-CM

## 2020-06-19 DIAGNOSIS — J449 Chronic obstructive pulmonary disease, unspecified: Secondary | ICD-10-CM | POA: Diagnosis not present

## 2020-06-19 DIAGNOSIS — Z95828 Presence of other vascular implants and grafts: Secondary | ICD-10-CM

## 2020-06-19 DIAGNOSIS — K219 Gastro-esophageal reflux disease without esophagitis: Secondary | ICD-10-CM | POA: Diagnosis present

## 2020-06-19 DIAGNOSIS — E041 Nontoxic single thyroid nodule: Secondary | ICD-10-CM | POA: Diagnosis not present

## 2020-06-19 DIAGNOSIS — Z87891 Personal history of nicotine dependence: Secondary | ICD-10-CM

## 2020-06-19 LAB — BASIC METABOLIC PANEL - CANCER CENTER ONLY
Anion gap: 6 (ref 5–15)
BUN: 17 mg/dL (ref 8–23)
CO2: 30 mmol/L (ref 22–32)
Calcium: 9.6 mg/dL (ref 8.9–10.3)
Chloride: 104 mmol/L (ref 98–111)
Creatinine: 0.85 mg/dL (ref 0.44–1.00)
GFR, Est AFR Am: >60 mL/min (ref >60)
GFR, Estimated: >60 mL/min (ref >60)
Glucose, Bld: 104 mg/dL — ABNORMAL HIGH (ref 70–99)
Potassium: 4.8 mmol/L (ref 3.5–5.1)
Sodium: 140 mmol/L (ref 135–145)

## 2020-06-19 LAB — CBC WITH DIFFERENTIAL (CANCER CENTER ONLY)
Abs Immature Granulocytes: 0.01 10*3/uL (ref 0.00–0.07)
Basophils Absolute: 0 10*3/uL (ref 0.0–0.1)
Basophils Relative: 1 %
Eosinophils Absolute: 0.1 10*3/uL (ref 0.0–0.5)
Eosinophils Relative: 2 %
HCT: 38 % (ref 36.0–46.0)
Hemoglobin: 13 g/dL (ref 12.0–15.0)
Immature Granulocytes: 0 %
Lymphocytes Relative: 15 %
Lymphs Abs: 0.8 10*3/uL (ref 0.7–4.0)
MCH: 33.2 pg (ref 26.0–34.0)
MCHC: 34.2 g/dL (ref 30.0–36.0)
MCV: 97.2 fL (ref 80.0–100.0)
Monocytes Absolute: 0.6 10*3/uL (ref 0.1–1.0)
Monocytes Relative: 12 %
Neutro Abs: 3.6 10*3/uL (ref 1.7–7.7)
Neutrophils Relative %: 70 %
Platelet Count: 118 10*3/uL — ABNORMAL LOW (ref 150–400)
RBC: 3.91 MIL/uL (ref 3.87–5.11)
RDW: 13 % (ref 11.5–15.5)
WBC Count: 5.1 10*3/uL (ref 4.0–10.5)
nRBC: 0 % (ref 0.0–0.2)

## 2020-06-19 LAB — VITAMIN B12: Vitamin B-12: 133 pg/mL — ABNORMAL LOW (ref 180–914)

## 2020-06-19 LAB — SARS CORONAVIRUS 2 BY RT PCR (HOSPITAL ORDER, PERFORMED IN ~~LOC~~ HOSPITAL LAB): SARS Coronavirus 2: NEGATIVE

## 2020-06-19 LAB — TSH: TSH: 0.653 u[IU]/mL (ref 0.350–4.500)

## 2020-06-19 MED ORDER — HEPARIN SODIUM (PORCINE) 5000 UNIT/ML IJ SOLN
5000.0000 [IU] | Freq: Three times a day (TID) | INTRAMUSCULAR | Status: DC
  Administered 2020-06-19 – 2020-06-23 (×11): 5000 [IU] via SUBCUTANEOUS
  Filled 2020-06-19 (×11): qty 1

## 2020-06-19 MED ORDER — ACETAMINOPHEN 325 MG PO TABS
325.0000 mg | ORAL_TABLET | Freq: Once | ORAL | Status: AC
  Administered 2020-06-19: 325 mg via ORAL

## 2020-06-19 MED ORDER — SERTRALINE HCL 100 MG PO TABS
200.0000 mg | ORAL_TABLET | Freq: Every evening | ORAL | Status: DC
  Administered 2020-06-19 – 2020-06-22 (×4): 200 mg via ORAL
  Filled 2020-06-19 (×4): qty 2

## 2020-06-19 MED ORDER — ALBUTEROL SULFATE (2.5 MG/3ML) 0.083% IN NEBU: 2.5000 mg | INHALATION_SOLUTION | Freq: Four times a day (QID) | RESPIRATORY_TRACT | Status: DC | PRN

## 2020-06-19 MED ORDER — HYDRALAZINE HCL 20 MG/ML IJ SOLN: 5.0000 mg | Freq: Four times a day (QID) | INTRAMUSCULAR | Status: DC | PRN

## 2020-06-19 MED ORDER — PANTOPRAZOLE SODIUM 40 MG PO TBEC
40.0000 mg | DELAYED_RELEASE_TABLET | Freq: Every day | ORAL | Status: DC
  Administered 2020-06-20 – 2020-06-23 (×4): 40 mg via ORAL
  Filled 2020-06-19 (×4): qty 1

## 2020-06-19 MED ORDER — OXYCODONE HCL 5 MG PO TABS
10.0000 mg | ORAL_TABLET | Freq: Once | ORAL | Status: AC
  Administered 2020-06-19: 10 mg via ORAL

## 2020-06-19 MED ORDER — POLYETHYLENE GLYCOL 3350 17 G PO PACK
17.0000 g | PACK | Freq: Every day | ORAL | Status: DC
  Administered 2020-06-20 – 2020-06-21 (×3): 17 g via ORAL
  Filled 2020-06-19 (×4): qty 1

## 2020-06-19 MED ORDER — ATORVASTATIN CALCIUM 10 MG PO TABS
20.0000 mg | ORAL_TABLET | ORAL | Status: DC
  Administered 2020-06-19 – 2020-06-23 (×3): 20 mg via ORAL
  Filled 2020-06-19: qty 2
  Filled 2020-06-19: qty 1
  Filled 2020-06-19: qty 2

## 2020-06-19 MED ORDER — SENNOSIDES-DOCUSATE SODIUM 8.6-50 MG PO TABS: 1.0000 | ORAL_TABLET | Freq: Every evening | ORAL | Status: DC | PRN

## 2020-06-19 MED ORDER — PROCHLORPERAZINE MALEATE 10 MG PO TABS
10.0000 mg | ORAL_TABLET | Freq: Four times a day (QID) | ORAL | Status: DC | PRN
  Filled 2020-06-19: qty 1

## 2020-06-19 MED ORDER — ACETAMINOPHEN 325 MG PO TABS
ORAL_TABLET | ORAL | Status: AC
  Filled 2020-06-19: qty 1

## 2020-06-19 MED ORDER — LEVOTHYROXINE SODIUM 75 MCG PO TABS
75.0000 ug | ORAL_TABLET | Freq: Every day | ORAL | Status: DC
  Administered 2020-06-20 – 2020-06-23 (×4): 75 ug via ORAL
  Filled 2020-06-19 (×5): qty 1

## 2020-06-19 MED ORDER — OXYCODONE HCL 5 MG PO TABS
ORAL_TABLET | ORAL | Status: AC
  Filled 2020-06-19: qty 2

## 2020-06-19 MED ORDER — OXYCODONE-ACETAMINOPHEN 5-325 MG PO TABS
1.0000 | ORAL_TABLET | ORAL | Status: DC | PRN
  Administered 2020-06-19 – 2020-06-23 (×17): 1 via ORAL
  Filled 2020-06-19 (×18): qty 1

## 2020-06-19 MED ORDER — OXYCODONE HCL 5 MG PO TABS
5.0000 mg | ORAL_TABLET | ORAL | Status: DC | PRN
  Administered 2020-06-19 – 2020-06-23 (×12): 5 mg via ORAL
  Filled 2020-06-19 (×14): qty 1

## 2020-06-19 MED ORDER — OXYCODONE-ACETAMINOPHEN 10-325 MG PO TABS: 1.0000 | ORAL_TABLET | ORAL | Status: DC | PRN

## 2020-06-19 MED ORDER — ALPRAZOLAM 0.25 MG PO TABS
0.2500 mg | ORAL_TABLET | Freq: Once | ORAL | Status: AC
  Administered 2020-06-20: 0.25 mg via ORAL
  Filled 2020-06-19 (×2): qty 1

## 2020-06-19 MED ORDER — ONDANSETRON HCL 4 MG PO TABS: 8.0000 mg | ORAL_TABLET | Freq: Three times a day (TID) | ORAL | Status: DC | PRN

## 2020-06-19 NOTE — Progress Notes (Signed)
Failed attempt at MRI. Spoke with RN regarding exam. Patient requesting for anesthesia.

## 2020-06-19 NOTE — Progress Notes (Signed)
[place] OFFICE PROGRESS NOTE   Diagnosis: Anal cancer  INTERVAL HISTORY:   Joanne Klein returns as scheduled. She is here with her daughter. She reports increased leg pain and weakness. She has difficulty ambulating. No further falls. She is having bowel movements. No difficulty with urination. She reports her symptoms have been present for several months. She was seen at the [facility] emergency room on 06/13/2020 and underwent thoracic and lumbar MRI scans on 06/14/2020. No significant stenosis in the thoracic spine. Degenerative changes in the lumbar spine. No fracture or mass.  No arm weakness. Objective:  Vital signs in last 24 hours:  Blood pressure (!) 149/99, pulse 88, temperature 98.2 F (36.8 C), temperature source Temporal, resp. rate 17, height 5\' 6"  (1.676 m), SpO2 98 %.  Resp: Lungs clear bilaterally Cardio: Regular rate and rhythm GI: No hepatosplenomegaly Vascular: No leg edema Neuro: Decreased strength with hip flexion bilaterally. 3+/5 strength with dorsi flexion at the left foot. 4/5 strength with extension at the left knee Skin: Multiple ecchymoses over the extremities    Lab Results:  Lab Results  Component Value Date   WBC 5.1 06/19/2020   HGB 13.0 06/19/2020   HCT 38.0 06/19/2020   MCV 97.2 06/19/2020   PLT 118 (L) 06/19/2020   NEUTROABS 3.6 06/19/2020    CMP  Lab Results  Component Value Date   NA 140 06/19/2020   K 4.8 06/19/2020   CL 104 06/19/2020   CO2 30 06/19/2020   GLUCOSE 104 (H) 06/19/2020   BUN 17 06/19/2020   CREATININE 0.85 06/19/2020   CALCIUM 9.6 06/19/2020   PROT 6.4 (L) 06/14/2020   ALBUMIN 3.5 06/14/2020   AST 15 06/14/2020   ALT 13 06/14/2020   ALKPHOS 72 06/14/2020   BILITOT 1.1 06/14/2020   GFRNONAA >60 06/19/2020   GFRAA >60 06/19/2020    Medications: I have reviewed the patient's current medications.   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 Biopsy 08/30/2019 confirmed  well-differentiated squamous cell carcinoma with positive P 16 and p63 stains ? CTs 09/14/2019-abnormal soft tissue fullness at the lower anus/perianal soft tissues, asymmetric left inguinal lymphadenopathy, borderline enlarged left external iliac node, mild stranding and cutaneous thickening of the left gluteal fold, 2 to 3 mm pulmonary nodules, 1.6 cm right hepatic lesion-likely a hemangioma ? PET scan 40/07/1447-[id number] hypermetabolic anal mass. 3 hypermetabolic left inguinal lymph nodes. Left external iliac node with minimally higher than blood pool activity. 1.0 x 0.8 cm left thyroid nodule with activity mildly above background blood pool activity but below liver activity. No perceptible hypermetabolic activity in the vicinity of the lateral right hepatic lobe lesion seen on the 09/14/2019 CT. ? Began concurrent chemoRT with 5FU/mitomycin on 10/03/2019 ? Cycle25-FU, dose reduced, mitomycin held 11/07/2019 ? Radiation completed 11/24/2019 ? CT abdomen/pelvis 12/27/2019-extensive left-sided colonic diverticulosis without diverticulitis. No bowel obstruction. No abscess in the abdomen or pelvis. Rectum borderline distended with stool without perirectal wall thickening or soft tissue stranding. Left inguinal and external iliac lymph nodes now subcentimeter compared to the previous size. Stable small lesion in the right lobe of the liver. No new liver lesions evident. 2. COPD 3. Fibromyalgia 4. Chronic back pain 5. Hypothyroid 6. Depression 7. History of uterine cancer-age 68 8. Pain secondary to #1 9. Orthostatic hypotension, fatigue, dyspnea on exertion, early mucositis requiring supportive care on day 9, secondary to chemotherapy  10. Thrombocytopenia PLT 58K and neutropenia ANC 1.4 on day 9, secondary to chemotherapy 11.  Worsening cytopenias, PLT 8K and ANC 0.0 on day 15, secondary to chemotherapy. Started prophylaxis with cipro on 10/22 day 11 12. Hospital admission 10/17/2019 -fatigue,  pancytopenia, hypokalemia 13.Left thyroid nodule with activity mildly above background blood pool activity butbelowliver activity on PET scan 09/27/2019. Thyroid ultrasound 219 2021-1.6 cm nodule left inferior thyroid gland.  12/24/7515 FNA-benign follicular nodule. 14.Tiny lung nodules noted on chest CT 09/14/2019 15.  Falls, back/leg pain and leg weakness-CT lumbar spine 05/26/2020 with no acute abnormality within the lumbar spine; multilevel degenerative spondylosis with resultant mild canal with bilateral lateral recess stenosis at L4-5; moderate bilateral L4 and L5 foraminal stenosis related to disc bulge, reactive endplate changes and facet degeneration; soft tissue density/stranding involving the presacral space anterior to the sacrum, incompletely visualized  MRI thoracic and lumbar spine 06/14/2020-no mass or fracture, degenerative changes in the thoracic and lumbar spine     Disposition: Ms. Cutrone has a history of anal cancer. She completed treatment in December 2020. She has developed lower back/leg pain and leg weakness. She has experienced multiple falls. MRIs of the thoracic and lumbar spine were nondiagnostic.  I have discussed the case with neurosurgery and neuro-oncology. Ms. Mcgough has difficulty with self-care secondary to the neurologic symptoms. I recommend hospital admission for a neurology consult, lumbar puncture to look for evidence of carcinomatous meningitis, and a PT/OT evaluation.  I discussed the plan with Ms. Seyfried and her daughter. She is in agreement with hospital admission.  Outpatient follow-up will be scheduled at the Cancer center pending the hospital evaluation.  Betsy Coder, MD  06/19/2020  2:26 PM

## 2020-06-19 NOTE — Progress Notes (Signed)
Awaiting admission to [facility] in infusion area. Patient reported back/leg pain "8" at 11:15 and was given Oxycodone 10mg  + Tylenol 325mg . @ 12:40 rates her pain "6" which has been her baseline. @ 1443: transported via w/c in stable condition to #1601 and gave report to [place], Therapist, sports. Reviewed v/s, cancer history and reason for admission and her physical limitations and she is High Fall Risk. Called her niece, Bernadette Hoit as requested and left VM that she was in 5 at [facility].

## 2020-06-19 NOTE — H&P (Addendum)
History and Physical  Joanne Klein PZW:[id number] DOB: 26-May-1946 DOA: 06/19/2020   PCP: Hoyt Koch, MD  Outpatient Specialists: Dr. Benay Spice, oncology Patient coming from: Direct admit from cancer center   Chief Complaint: Lower extremity weakness and pain  HPI: Joanne Klein is a 74 y.o. female with medical history significant for anal cancer status post radiation and chemotherapy completed on 11/2019, COPD, hypothyroidism, depression who presents on 06/19/2020 as a direct admission from cancer center with lower extremity weakness, numbness and tingling has been ongoing for 2 months but has progressively worsened in the past week.   She was last evaluated on 6/24 in the ED for similar complaints MRI of lumbar/thoracic spine at that time showed no acute etiologies as imaging was only consistent with chronic degenerative changes and some mild stenosis.  Since then she has had at least 3 falls related to her knees locking, having numbness and tingling that starts in her feet with sharp pain behind her knees that radiates up to her thighs.  She denies any headache, changes in vision, upper arm weakness, urinary or fecal incontinence, no fevers or chills, no recent sick contacts.  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  Labs were obtained at that office visit which were essentially unremarkable.   Review of Systems:As mentioned in the history of present illness.Review of systems are otherwise negative Patient seen on the floor.   Past Medical History:  Diagnosis Date  . Allergic rhinitis   . Arthritis   . Bursitis    Both Shoulders  . Chronic low back pain   . Degenerative disk disease   . Depression   . Diverticulitis   . Emphysema of lung ([place])   . Fibromyalgia   . GERD (gastroesophageal reflux disease)   . History of  fibromyalgia   . Hypertension   . Hypothyroidism   . Irritable bowel syndrome   . Neuralgia, post-herpetic   . Obesity, unspecified   . Obstructive sleep apnea   . OCD (obsessive compulsive disorder)   . Pain in joint, site unspecified   . Pure hypercholesterolemia   . Scoliosis   . Situational stress   . Spastic colon   . Tubular adenoma   . Uterine cancer [facility])    Past Surgical History:  Procedure Laterality Date  . BREAST LUMPECTOMY     knot removed  . CHOLECYSTECTOMY    . HYSTEROTOMY    . RADIOLOGY WITH ANESTHESIA Bilateral 06/14/2020   Procedure: MRI WITH ANESTHESIA;  Surgeon: Radiologist, Medication, MD;  Location: [facility];  Service: Radiology;  Laterality: Bilateral;  . TONSILLECTOMY    . tumor in left forearm     Allergies  Allergen Reactions  . Prozac [Fluoxetine Hcl] Anaphylaxis  . Codeine Itching  . Morphine And Related Itching  . Sulfa Antibiotics Itching and Nausea Only   Social History:  reports that she quit smoking about 23 years ago. Her smoking use included cigarettes. She has a 68.00 pack-year smoking history. She has never used smokeless tobacco. She reports previous alcohol use. She reports that she does not use drugs. Family History  Problem Relation Age of Onset  . Stomach cancer Father   . Liver cancer Father   . Diabetes Father   . Colon cancer Neg Hx       Prior to Admission medications   Medication Sig Start Date  End Date Taking? Authorizing Provider  atorvastatin (LIPITOR) 20 MG tablet TAKE 1 TABLET BY MOUTH  EVERY OTHER DAY Patient taking differently: Take 20 mg by mouth 4 (four) times a week. Saturday, Sunday, Tuesday, and Thursday 02/13/20  Yes Hoyt Koch, MD  calcium carbonate (TUMS - DOSED IN MG ELEMENTAL CALCIUM) 500 MG chewable tablet Chew 1 tablet by mouth daily as needed.    Yes [provider]  Cholecalciferol (VITAMIN D-3) 1000 UNITS CAPS Take 1,000 Units by mouth daily.    Yes [provider]    levothyroxine (SYNTHROID) 75 MCG tablet TAKE 1 TABLET BY MOUTH  DAILY BEFORE BREAKFAST Patient taking differently: Take 75 mcg by mouth daily before breakfast.  02/07/20  Yes Hoyt Koch, MD  omeprazole (PRILOSEC) 40 MG capsule TAKE 1 CAPSULE BY MOUTH  DAILY Patient taking differently: Take 40 mg by mouth daily.  02/07/20  Yes Hoyt Koch, MD  ondansetron (ZOFRAN) 8 MG tablet Take 8 mg by mouth every 8 (eight) hours as needed for nausea or vomiting.   Yes [provider]  oxyCODONE-acetaminophen (PERCOCET) 10-325 MG tablet Take 1 tablet by mouth every 4 (four) hours. Gets from another provider @@ 180 per month    Yes [provider]  phenylephrine-shark liver oil-mineral oil-petrolatum (PREPARATION H) 0.25-3-14-71.9 % rectal ointment Place 1 application rectally 2 (two) times daily as needed for hemorrhoids.   Yes [provider]  Polyethylene Glycol 3350 (MIRALAX PO) Take 17 g by mouth at bedtime.    Yes [provider]  potassium chloride SA (KLOR-CON) 20 MEQ tablet Take 1 tablet (20 mEq total) by mouth daily. 06/12/20  Yes Ladell Pier, MD  prochlorperazine (COMPAZINE) 10 MG tablet Take 10 mg by mouth every 6 (six) hours as needed for nausea or vomiting.   Yes [provider]  sertraline (ZOLOFT) 100 MG tablet TAKE 2 TABLETS BY MOUTH AT  BEDTIME Patient taking differently: Take 200 mg by mouth every evening.  02/07/20  Yes Hoyt Koch, MD  loperamide (IMODIUM) 2 MG capsule Take 1 capsule (2 mg total) by mouth as needed for diarrhea or loose stools. Patient not taking: Reported on 06/19/2020 11/16/19   Lucrezia Starch, MD    Physical Exam: BP (!) 161/85 (BP Location: Right Arm)   Pulse 85   Temp 97.9 F (36.6 C) (Oral)   Resp 15   LMP  (LMP Unknown)   SpO2 93%   Constitutional elderly female, in obvious discomfort Eyes: EOMI, anicteric, normal conjunctivae ENMT: Oropharynx with moist mucous membranes, normal  dentition Neck: FROM, no thyromegaly Cardiovascular: RRR no MRGs, with no peripheral edema Respiratory: Normal respiratory effort, clear breath sounds  Abdomen: Soft,non-tender, normal bowel sounds Skin: Scattered bruising particularly on left lateral leg Neurologic: Able to move left leg against gravity and some resistance, right leg unable to move against gravity, proximal muscle strength seems greater than distal and lower legs.  Strength intact in upper extremities.  No facial droop, no changes in speech. Psychiatric: Anxious, alert oriented x4          Labs on Admission:  Basic Metabolic Panel: Recent Labs  Lab 06/14/20 1310 06/19/20 0806  NA 142 140  K 2.9* 4.8  CL 105 104  CO2 27 30  GLUCOSE 116* 104*  BUN 27* 17  CREATININE 0.95 0.85  CALCIUM 9.0 9.6   Liver Function Tests: Recent Labs  Lab 06/14/20 1310  AST 15  ALT 13  ALKPHOS 72  BILITOT 1.1  PROT 6.4*  ALBUMIN 3.5   No results for input(s): LIPASE, AMYLASE in the last 168 hours. No results for input(s): AMMONIA in the last 168 hours. CBC: Recent Labs  Lab 06/14/20 1310 06/19/20 0806  WBC 4.9 5.1  NEUTROABS 3.4 3.6  HGB 12.6 13.0  HCT 37.0 38.0  MCV 95.6 97.2  PLT 99* 118*   Cardiac Enzymes: No results for input(s): CKTOTAL, CKMB, CKMBINDEX, TROPONINI in the last 168 hours.  BNP (last 3 results) No results for input(s): BNP in the last 8760 hours.  ProBNP (last 3 results) No results for input(s): PROBNP in the last 8760 hours.  CBG: No results for input(s): GLUCAP in the last 168 hours.  Radiological Exams on Admission: No results found.    Assessment/Plan Present on Admission: . Weakness of both legs  Active Problems:   Numbness and tingling   Weakness of both legs   Falls frequently   Weakness    Bilateral lower leg weakness, intermittent numbness and tingling, and frequent falls x 2 months, worsening Has led to frequent falls, inability to perform a fADLs at home, has been  ongoing for the past 2 months and intensely worsened over the past week.  Last MRI of lumbar/thoracic spine on 6/24 was unremarkable for anything acute.  Patient denies any other neurologic deficits.  Does have history of anal cancer completed chemotherapy on 12/11/2019. -Discussed with on-call neurologist [name] who recommends MRI with and without brain, cervical, thoracic, lumbar spine.  As well as LP -Patient has intense claustrophobia and per radiology will require sedation with general anesthesia for MRI for several hours, patient will require transfer to [facility] for this -IR consult for LP: Flow cytometry, cytology, cell count with differential, protein, glucose, Gram stain, bacterial culture, fungal culture as recommended by neurology -Neurochecks -B12, TSH -Hold off on PT/OT until imaging  COPD, stable No wheezing, on room air -Albuterol as needed  Chronic back pain, stable Denies any back pain though reports some increased pressure related to the pain in her legs.  Recent lumbar/thoracic spine MRI imaging on 6/24 was unremarkable for acute disease -Continue home pain control: Oxycodone  Hypothyroidism -Check TSH -Continue home Synthroid  Depression, stable -Home Zoloft  DVT prophylaxis: Heparin  Code Status: DNR, confirmed on admission  Family Communication: None  Disposition Plan: Expect greater than 2 midnight stay given profound pain, intermittent weakness and inability to even stand from bed, currently undergoing work-up with lab work, neurology recommended imaging as well as LP.  Need for transfer to [facility] given patient will need sedation to be able to tolerate MRI for several hours  Consults called: Neurology  Admission status: Admitted as inpatient to [place] unit      Desiree Hane MD [facility]  Pager [phone]  If 7PM-7AM, please contact night-coverage [email or web address] Password [facility]  06/19/2020, 6:12 PM

## 2020-06-19 NOTE — Care Plan (Signed)
[facility] Plan of Care Note  Patient: Joanne Klein    [id number]:[id number]  PCP: Joanne Koch, MD    DOB: 1946/11/12  DOS: 06/19/2020   Received a phone call from Facility: [facility] regarding transfer of [name]. Requesting: Dr. Learta Codding Reason for transfer: Progressively worsening lower extremity weakness, inability to take care of herself at home, increased falls at home, left foot drop in the setting of known anal cancer with complete medical treatment in December.  History:Recent MRI in ED on 6/24 of lumbar and thoracic spine showing degenerative changes with mild stenosis no acute fracture or mass. Vitals: 149/99, heart rate 88, afebrile Labs: None Exam: Lower leg weakness, otherwise stable per provider Treatment received: None  Plan of care: The patient will be accepted for admission to [facility], MedSurg unit.  Progressive leg weakness x2 months. Patient having increased falls at home. 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 Mickeal Skinner, neuro oncologist). Recommend consulting neurology once patient arrives to obtain LP, PT/OT, further work-up of weakness   Author: Oretha Milch, MD [facility] 06/19/2020  If 7PM-7AM, please contact night-coverage at [email or web address],

## 2020-06-19 NOTE — Care Plan (Deleted)
[facility] Plan of Care Note  Patient: Joanne Klein    [id number]  PCP: Joanne Koch, MD    DOB: May 22, 1946  DOS: 06/19/2020   Received a phone call from Facility: [facility] regarding transfer of [name]. Requesting: Dr. Learta Klein Reason for transfer: Progressively worsening lower extremity weakness, inability to take care of herself at home, increased falls at home, left foot drop in the setting of known anal cancer with complete medical treatment in December.  History:Recent MRI in ED on 6/24 of lumbar and thoracic spine showing degenerative changes with mild stenosis no acute fracture or mass. Vitals: 149/99, heart rate 88, afebrile Labs: None Exam: Lower leg weakness, otherwise stable per provider Treatment received: None  Plan of care: The patient will be accepted for admission to [facility], MedSurg unit.  Progressive leg weakness x2 months. Patient having increased falls at home. 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 Mickeal Klein, neuro oncologist). Recommend consulting neurology once patient arrives to obtain LP, PT/OT, further work-up of weakness   Author: Oretha Milch, MD [facility] 06/19/2020  If 7PM-7AM, please contact night-coverage at [email or web address],

## 2020-06-20 ENCOUNTER — Telehealth: Payer: Self-pay | Admitting: Oncology

## 2020-06-20 DIAGNOSIS — F329 Major depressive disorder, single episode, unspecified: Secondary | ICD-10-CM

## 2020-06-20 DIAGNOSIS — E039 Hypothyroidism, unspecified: Secondary | ICD-10-CM

## 2020-06-20 DIAGNOSIS — R160 Hepatomegaly, not elsewhere classified: Secondary | ICD-10-CM

## 2020-06-20 DIAGNOSIS — J449 Chronic obstructive pulmonary disease, unspecified: Secondary | ICD-10-CM

## 2020-06-20 DIAGNOSIS — R0609 Other forms of dyspnea: Secondary | ICD-10-CM

## 2020-06-20 DIAGNOSIS — M549 Dorsalgia, unspecified: Secondary | ICD-10-CM

## 2020-06-20 DIAGNOSIS — D6959 Other secondary thrombocytopenia: Secondary | ICD-10-CM

## 2020-06-20 DIAGNOSIS — R5383 Other fatigue: Secondary | ICD-10-CM

## 2020-06-20 DIAGNOSIS — E785 Hyperlipidemia, unspecified: Secondary | ICD-10-CM

## 2020-06-20 DIAGNOSIS — I951 Orthostatic hypotension: Secondary | ICD-10-CM

## 2020-06-20 DIAGNOSIS — R29898 Other symptoms and signs involving the musculoskeletal system: Secondary | ICD-10-CM

## 2020-06-20 DIAGNOSIS — R590 Localized enlarged lymph nodes: Secondary | ICD-10-CM

## 2020-06-20 DIAGNOSIS — E041 Nontoxic single thyroid nodule: Secondary | ICD-10-CM

## 2020-06-20 DIAGNOSIS — M797 Fibromyalgia: Secondary | ICD-10-CM

## 2020-06-20 DIAGNOSIS — Z79899 Other long term (current) drug therapy: Secondary | ICD-10-CM

## 2020-06-20 DIAGNOSIS — G8929 Other chronic pain: Secondary | ICD-10-CM

## 2020-06-20 DIAGNOSIS — C210 Malignant neoplasm of anus, unspecified: Secondary | ICD-10-CM

## 2020-06-20 LAB — CBC
HCT: 35.1 % — ABNORMAL LOW (ref 36.0–46.0)
Hemoglobin: 12 g/dL (ref 12.0–15.0)
MCH: 32.9 pg (ref 26.0–34.0)
MCHC: 34.2 g/dL (ref 30.0–36.0)
MCV: 96.2 fL (ref 80.0–100.0)
Platelets: 102 10*3/uL — ABNORMAL LOW (ref 150–400)
RBC: 3.65 MIL/uL — ABNORMAL LOW (ref 3.87–5.11)
RDW: 13.4 % (ref 11.5–15.5)
WBC: 4.3 10*3/uL (ref 4.0–10.5)
nRBC: 0 % (ref 0.0–0.2)

## 2020-06-20 LAB — BASIC METABOLIC PANEL
Anion gap: 9 (ref 5–15)
BUN: 20 mg/dL (ref 8–23)
CO2: 29 mmol/L (ref 22–32)
Calcium: 9.1 mg/dL (ref 8.9–10.3)
Chloride: 102 mmol/L (ref 98–111)
Creatinine, Ser: 0.89 mg/dL (ref 0.44–1.00)
GFR calc Af Amer: >60 mL/min (ref >60)
GFR calc non Af Amer: >60 mL/min (ref >60)
Glucose, Bld: 101 mg/dL — ABNORMAL HIGH (ref 70–99)
Potassium: 3.9 mmol/L (ref 3.5–5.1)
Sodium: 140 mmol/L (ref 135–145)

## 2020-06-20 MED ORDER — LORAZEPAM 1 MG PO TABS
1.0000 mg | ORAL_TABLET | Freq: Four times a day (QID) | ORAL | Status: DC | PRN
  Administered 2020-06-21 – 2020-06-23 (×3): 1 mg via ORAL
  Filled 2020-06-20 (×3): qty 2

## 2020-06-20 MED ORDER — CYANOCOBALAMIN 1000 MCG/ML IJ SOLN
1000.0000 ug | Freq: Once | INTRAMUSCULAR | Status: AC
  Administered 2020-06-20: 1000 ug via INTRAMUSCULAR
  Filled 2020-06-20: qty 1

## 2020-06-20 NOTE — Consult Note (Signed)
Neurology Consultation  Reason for Consult: Progressive bilateral lower extremity weakness Referring Physician: Dr. Oretha Milch, [facility]  CC: Progressive lower extremity weakness, gait instability  History is obtained from: Patient, chart review  HPI: Joanne Klein is a 74 y.o. female past medical history of hypertension, hypothyroidism, uterine cancer, anal cancer status post radiation and chemotherapy,, fibromyalgia, chronic back pain, admitted to inpatient medicine for progressive bilateral lower extremity weakness. The patient says that her legs have been feeling weak-primarily due to pain radiating from her back to the insides of her thigh towards her knee, which causes her legs to buckle and makes it unable to bear weight while walking.  She has had some falls, 1 of which is led to a right leg/ankle fracture. She cannot tell me an exact timeline as to when the weakness really started and when it suddenly worsened but says it has been ongoing now for a few months. Denies any focal tingling numbness or weakness.  Denies headaches.  Denies visual changes.  Denies any frank clumsiness.  Reports gait disturbance with inability to bear weight and sustaining multiple falls. Denies any bowel bladder involvement.  Has baseline fecal incontinence due to prior radiation which is unchanged. Has had prior CTs of the spine and had thoracic and lumbar spine MRI under sedation done on 06/14/2020 for similar complaints. She was at her doctor's office-oncology clinic-where her weakness was observed and she was sent in as a direct admit for further spine imaging and possible lumbar puncture after discussion with neuro oncology to look for leptomeningeal metastases.  ROS:   Review of Systems  Constitutional: Positive for malaise/fatigue. Negative for chills and fever.  HENT: Negative.  Negative for hearing loss, sore throat and tinnitus.   Eyes: Negative for blurred vision, double vision and  photophobia.  Respiratory: Negative.  Negative for cough and hemoptysis.   Cardiovascular: Negative.  Negative for chest pain, palpitations and leg swelling.  Gastrointestinal: Negative for heartburn and nausea.  Genitourinary: Negative for dysuria and hematuria.  Musculoskeletal: Positive for back pain, falls and myalgias.  Skin: Negative.   Neurological: Positive for sensory change. Negative for dizziness, tingling, tremors, speech change, focal weakness and headaches.  Endo/Heme/Allergies: Negative for environmental allergies and polydipsia. Bruises/bleeds easily.  Psychiatric/Behavioral: Positive for depression. Negative for suicidal ideas.  On asking if there are any other stressors or any other things bothering her, she reports that she has been not getting along well with her daughter and that has been a source of a lot of mental distress.   Past Medical History:  Diagnosis Date  . Allergic rhinitis   . Arthritis   . Bursitis    Both Shoulders  . Chronic low back pain   . Degenerative disk disease   . Depression   . Diverticulitis   . Emphysema of lung ([place])   . Fibromyalgia   . GERD (gastroesophageal reflux disease)   . History of fibromyalgia   . Hypertension   . Hypothyroidism   . Irritable bowel syndrome   . Neuralgia, post-herpetic   . Obesity, unspecified   . Obstructive sleep apnea   . OCD (obsessive compulsive disorder)   . Pain in joint, site unspecified   . Pure hypercholesterolemia   . Scoliosis   . Situational stress   . Spastic colon   . Tubular adenoma   . Uterine cancer ([place])     Family History  Problem Relation Age of Onset  . Stomach cancer Father   . Liver  cancer Father   . Diabetes Father   . Colon cancer Neg Hx      Social History:   reports that she quit smoking about 23 years ago. Her smoking use included cigarettes. She has a 68.00 pack-year smoking history. She has never used smokeless tobacco. She reports previous alcohol use. She  reports that she does not use drugs.   Medications  Current Facility-Administered Medications:  .  albuterol (PROVENTIL) (2.5 MG/3ML) 0.083% nebulizer solution 2.5 mg, 2.5 mg, Nebulization, Q6H PRN, Oretha Milch D, MD .  ALPRAZolam Duanne Moron) tablet 0.25 mg, 0.25 mg, Oral, Once, Rise Patience, MD .  atorvastatin (LIPITOR) tablet 20 mg, 20 mg, Oral, Once per day on Sun Tue Thu Sat, Nettey, Shayla D, MD, 20 mg at 06/19/20 1620 .  heparin injection 5,000 Units, 5,000 Units, Subcutaneous, Q8H, Oretha Milch D, MD, 5,000 Units at 06/19/20 1620 .  hydrALAZINE (APRESOLINE) injection 5 mg, 5 mg, Intravenous, Q6H PRN, Oretha Milch D, MD .  levothyroxine (SYNTHROID) tablet 75 mcg, 75 mcg, Oral, QAC breakfast, Nettey, Myrlene Broker D, MD .  ondansetron (ZOFRAN) tablet 8 mg, 8 mg, Oral, Q8H PRN, Oretha Milch D, MD .  oxyCODONE-acetaminophen (PERCOCET/ROXICET) 5-325 MG per tablet 1 tablet, 1 tablet, Oral, Q4H PRN, 1 tablet at 06/20/20 0005 **AND** oxyCODONE (Oxy IR/ROXICODONE) immediate release tablet 5 mg, 5 mg, Oral, Q4H PRN, Oretha Milch D, MD, 5 mg at 06/20/20 0005 .  pantoprazole (PROTONIX) EC tablet 40 mg, 40 mg, Oral, Daily, Nettey, Myrlene Broker D, MD .  polyethylene glycol (MIRALAX / GLYCOLAX) packet 17 g, 17 g, Oral, QHS, Nettey, Shayla D, MD .  prochlorperazine (COMPAZINE) tablet 10 mg, 10 mg, Oral, Q6H PRN, Oretha Milch D, MD .  senna-docusate (Senokot-S) tablet 1 tablet, 1 tablet, Oral, QHS PRN, Oretha Milch D, MD .  sertraline (ZOLOFT) tablet 200 mg, 200 mg, Oral, QPM, Oretha Milch D, MD, 200 mg at 06/19/20 1947  Exam: Current vital signs: BP (!) 178/85 (BP Location: Right Arm)   Pulse 79   Temp 98.9 F (37.2 C) (Oral)   Resp 16   LMP  (LMP Unknown)   SpO2 91%  Vital signs in last 24 hours: Temp:  [97.5 F (36.4 C)-98.9 F (37.2 C)] 98.9 F (37.2 C) (06/29 2133) Pulse Rate:  [79-88] 79 (06/29 2133) Resp:  [15-17] 16 (06/29 2133) BP: (148-178)/(78-99) 178/85 (06/29  2133) SpO2:  [91 %-98 %] 91 % (06/29 2133) GENERAL: Awake, alert in NAD HEENT: - Normocephalic and atraumatic, dry mm, no LN++, no Thyromegally LUNGS - Clear to auscultation bilaterally with no wheezes CV - S1S2 RRR, no m/r/g, equal pulses bilaterally. ABDOMEN - Soft, nontender, nondistended with normoactive BS Ext: warm, well perfused, intact peripheral pulses, no edema, bruising on the left leg.  NEURO:  Mental Status: AA&Ox3  Language: speech is nondysarthric.  Naming, repetition, fluency, and comprehension intact. Cranial Nerves: Pupils equal round reactive to light, extraocular movements intact, visual fields full, no facial asymmetry, facial sensation intact, hearing intact, tongue uvula soft palate midline, sternocleidomastoid and trapezius strength normal, tongue midline. Motor: Upper extremities 5/5.  Lower extremity examination is inconsistent.  She says she is unable to flex her right hip at all but when the other leg is being tested, she drags her leg up flexing the right hip without any problems.  She also has Hoover sign positive on both lower extremities. On the right lower extremity: At hip she did not give me anything more than 2/5.  On the knee  flexors and extensors she is at least 4/5.  On the plantar and dorsiflexors she is at least 4+/5. On left lower extremity: At the hip she is at least a 4/5.  Knee flexors and extensors 4+/5.  Plantar flexors 4/5, dorsiflexor 2/5-again effort dependent. Tone: is normal and bulk is normal Sensation-stocking type pattern of diminished sensation in both lower extremities to touch and temperature but interestingly diminished vibratory sense on both knees in comparison to both ankles.  Also has diminished joint position sense in both toes. Coordination: FTN intact bilaterally, able to perform in lower extremities Gait- deferred Deep tendon reflexes: Symmetric in the upper extremity 2+.  Lower extremity-unable to elicit right knee jerk but has a  brisk left knee jerk and 1+ ankle jerks bilaterally with downgoing toes.   Labs I have reviewed labs in epic and the results pertinent to this consultation are:  CBC    Component Value Date/Time   WBC 5.1 06/19/2020 0806   WBC 4.9 06/14/2020 1310   RBC 3.91 06/19/2020 0806   HGB 13.0 06/19/2020 0806   HCT 38.0 06/19/2020 0806   PLT 118 (L) 06/19/2020 0806   MCV 97.2 06/19/2020 0806   MCH 33.2 06/19/2020 0806   MCHC 34.2 06/19/2020 0806   RDW 13.0 06/19/2020 0806   LYMPHSABS 0.8 06/19/2020 0806   MONOABS 0.6 06/19/2020 0806   EOSABS 0.1 06/19/2020 0806   BASOSABS 0.0 06/19/2020 0806    CMP     Component Value Date/Time   NA 140 06/19/2020 0806   NA 143 09/30/2012 0000   K 4.8 06/19/2020 0806   CL 104 06/19/2020 0806   CO2 30 06/19/2020 0806   GLUCOSE 104 (H) 06/19/2020 0806   BUN 17 06/19/2020 0806   BUN 19 09/30/2012 0000   CREATININE 0.85 06/19/2020 0806   CALCIUM 9.6 06/19/2020 0806   PROT 6.4 (L) 06/14/2020 1310   ALBUMIN 3.5 06/14/2020 1310   AST 15 06/14/2020 1310   AST 12 (L) 06/11/2020 1347   ALT 13 06/14/2020 1310   ALT 13 06/11/2020 1347   ALKPHOS 72 06/14/2020 1310   BILITOT 1.1 06/14/2020 1310   BILITOT 1.3 (H) 06/11/2020 1347   GFRNONAA >60 06/19/2020 0806   GFRAA >60 06/19/2020 0806    Lipid Panel     Component Value Date/Time   CHOL 197 02/17/2019 1637   TRIG 138.0 02/17/2019 1637   HDL 50.10 02/17/2019 1637   CHOLHDL 4 02/17/2019 1637   VLDL 27.6 02/17/2019 1637   LDLCALC 119 (H) 02/17/2019 1637   Vitamin B12 level-133 TSH normal   Imaging I have reviewed the images obtained: MRI of thoracolumbar spine with disc degeneration at T11-T12 and T12-L1 with small right-sided disc protrusion at T11-12.  No significant stenosis.  Negative for lumbar fracture.  Multilevel mild degenerative changes in the lumbar spine similar to the recent CT to which it was compared.  Mild to moderate subarticular stenosis on the left at L4-5.  Negative for  fracture or mass.  Assessment:  74 year old woman past history of hypertension, hypothyroidism, uterine cancer, anal cancer status post radiation chemotherapy, fibromyalgia, chronic back pain, admitted for evaluation of progressive bilateral lower extremity weakness with acute worsening over the past few days to weeks. Her examination-documented above in detail-is suggestive of some component of psychogenic weakness where she definitely has effort dependent weakness on both hip flexors, but she does have some sensory deficits which are likely related to her post chemo/radiation and vitamin B12 deficiency. Some of  the sensory changes might be responsible for her gait disturbance but as far as the weakness goes, I do not have a clear-cut explanation of why she should have enough weakness to not be able to walk normally. That said, given her anal cancer history, and its proximity to the lumbosacral plexus, it is worthwhile to image the pelvis-already has had thoracic and lumbar MRIs done without any clear explanation. Also to explore the B12 deficiency further for any evidence of true subacute combined degeneration of the spinal cord, C-spine MRI might be helpful.  Recommendations: Patient is extremely claustrophobic-requires sedation/anesthesia for MRI. She tells me she is already been scheduled for MRIs under anesthesia. I see orders for MRI brain, C-spine, T-spine and L-spine with and without contrast. MRI of the brain, C-spine, with and without contrast should be obtained.  She already has had MRI of the thoracic and lumbar spine without contrast and just the with contrast portion for them should suffice. I do not disagree with getting the MRI brain with and without contrast but I doubt that that would be of much yield. Given the malignancy history, an LP with cytology and flow cytometry could be helpful in determining an etiology of her weakness. We will follow the imaging studies and LP results  with you. Continue PT OT. Aggressive parenteral replenishment of B12.   -- Amie Portland, MD [facility] Pager: [phone] If 7pm to 7am, please call on call as listed on [facility].

## 2020-06-20 NOTE — Progress Notes (Signed)
PROGRESS NOTE    Joanne Klein  GQQ:[id number] DOB: [birth year] DOA: 06/19/2020 PCP: Hoyt Koch, MD  Brief Narrative:  HPI per Dr. Oretha Milch on 06/19/20 Joanne Klein is a 74 y.o. female with medical history significant for anal cancer status post radiation and chemotherapy completed on 11/2019, COPD, hypothyroidism, depression who presents on 06/19/2020 as a direct admission from cancer center with lower extremity weakness, numbness and tingling has been ongoing for 2 months but has progressively worsened in the past week.   She was last evaluated on 6/24 in the ED for similar complaints MRI of lumbar/thoracic spine at that time showed no acute etiologies as imaging was only consistent with chronic degenerative changes and some mild stenosis.  Since then she has had at least 3 falls related to her knees locking, having numbness and tingling that starts in her feet with sharp pain behind her knees that radiates up to her thighs.  She denies any headache, changes in vision, upper arm weakness, urinary or fecal incontinence, no fevers or chills, no recent sick contacts.  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  Labs were obtained at that office visit which were essentially unremarkable.  **Interim History Continues to have back and knee pain with some leg weakness.  Awaiting transfer to [facility] for further evaluation with an LP as well as has several MRI's under anesthesia.  Neurology consulted and appreciate their evaluation and they will follow the imaging studies and LP results and recommend aggressive pareneteral repletion of B12.  Assessment & Plan:   Active Problems:   Depression   Hypothyroidism   COPD GOLD II    Hyperlipidemia   Morbid obesity (HCC)   Numbness and tingling   Weakness of both legs   Falls  frequently   Weakness   Bilateral lower leg weakness, intermittent numbness and tingling, and frequent falls x 2 months, worsening -Has led to frequent falls, inability to perform a ADLs at home, has been ongoing for the past 2 months and intensely worsened over the past week.   -Last MRI of lumbar/thoracic spine on 6/24 was unremarkable for anything acute.  Patient denies any other neurologic deficits.   -Does have history of anal cancer completed chemotherapy on 12/11/2019. -Discussed with on-call neurologist [name] who recommends MRI with and without brain, cervical, thoracic, lumbar spine.  As well as LP and she is to be transferred to [facility] but still has bed pending  -Patient has intense claustrophobia and per radiology will require sedation with general anesthesia for MRI for several hours, patient will require transfer to [facility] for this -IR consult for LP: Flow cytometry, cytology, cell count with differential, protein, glucose, Gram stain, bacterial culture, fungal culture as recommended by neurology -C/w Neurochecks -Check B12 and was 133, TSH wa 0.653 -Hold off on PT/OT until imaging is done and then continue PT/OT  COPD, stable -No wheezing, on room air -C/w Albuterol 2.5 mg Neb q6hprn as needed Wheezing and SOB   Chronic back pain, stable -Denies any back pain though reports some increased pressure related to the pain in her legs.   -Recent lumbar/thoracic spine MRI imaging on 6/24 was unremarkable for acute disease -Continue home pain control: Oxycodone and Oxycodone-Acetaminophen and C/w Bowel Regimen with Senna-Docusate 1 tab po qHSprn Mild Constipation  HLD -C/w Atorvastatin 20 mg po 4 times weekly  Elevated BP -BP was 170/88 -C/w Pain Control and C/w Hydralazine   Hypothyroidism -Check TSH and was 0.653 -Continue home Levothyroxine 75 mcg po Daily   Depression and Anxiety -C/w Home Sertraline 200 mg po [place] Evening and Alprazolam 0.25 mg po Once for  MRI  B12 Deficiency -B12 Level was 133 -Replete and Given IM Cyanocobalamin 1000 mcg Injection and will start po in the AM   Anal Cancer -Sees Dr. Benay Spice -S/p Chemo and Radiation -Oncology following as needed   Obesity -Estimated body mass index is 36.32 kg/m as calculated from the following:   Height as of an earlier encounter on 06/19/20: 5\' 6"  (1.676 m).   Weight as of 06/14/20: 102.1 kg. -Continued Weight Loss and Dietary Counseling  GOC: DNR, poA  DVT prophylaxis: Heparin 5,000 units sq q8h Code Status: DO NOT RESUSCITATE  Family Communication: No family present at bedside  Disposition Plan: Transfer to [facility] when bed is available for further workup and evaluation and have MRI under sedation and LP done  Consultants:   Medical Oncology  Neurology  Interventional Radiology    Procedures: None  Antimicrobials:  Anti-infectives (From admission, onward)   None     Subjective: Seen and examined at bedside and she appears uncomfortable still complaining of back and leg pain radiating down her leg.  No nausea or vomiting.  Did not feel well.  Appears uncomfortable and states the neurologist came in at 1 AM.  No chest pain, lightheadedness or dizziness.  No other concerns or complaints at this time except for the issues in her legs.  Objective: Vitals:   06/19/20 2133 06/20/20 0213 06/20/20 0523 06/20/20 1215  BP: (!) 178/85 (!) 169/86 (!) 182/110 (!) 170/88  Pulse: 79 91 80 92  Resp: 16 15 16 16   Temp: 98.9 F (37.2 C) 98.1 F (36.7 C) 97.7 F (36.5 C) (!) 97.5 F (36.4 C)  TempSrc: Oral Oral Oral Oral  SpO2: 91% 90% 91% 94%   No intake or output data in the 24 hours ending 06/20/20 1506 There were no vitals filed for this visit.  Examination: Physical Exam:  Constitutional: WN/WD obese Caucasian female currently in NAD and appears anxious and does appear uncomfortable Eyes: Lids and conjunctivae normal, sclerae anicteric  ENMT: External Ears,  Nose appear normal. Grossly normal hearing. Neck: Appears normal, supple, no cervical masses, normal ROM, no appreciable thyromegaly; no JVD Respiratory: Diminished to auscultation bilaterally, no wheezing, rales, rhonchi or crackles. Normal respiratory effort and patient is not tachypenic. No accessory muscle use.  Unlabored breathing Cardiovascular: RRR, no murmurs / rubs / gallops. S1 and S2 auscultated. No extremity edema.  Abdomen: Soft, non-tender, distended secondary to body habitus. Bowel sounds positive x4.  GU: Deferred. Musculoskeletal: No clubbing / cyanosis of digits/nails. No joint deformity upper and lower extremities. Skin: No rashes, lesions, ulcers on limited skin evaluation. No induration; Warm and dry.  Neurologic: CN 2-12 grossly intact with no focal deficits. Romberg sign and cerebellar reflexes not assessed.  Psychiatric: Normal judgment and insight. Alert and oriented x 3.  Anxious mood and appropriate affect.   Data Reviewed: I have personally reviewed following labs and imaging studies  CBC: Recent Labs  Lab 06/14/20 1310 06/19/20 0806 06/20/20 0559  WBC 4.9 5.1 4.3  NEUTROABS 3.4 3.6  --   HGB 12.6 13.0 12.0  HCT 37.0 38.0 35.1*  MCV 95.6 97.2 96.2  PLT 99* 118* 299*   Basic Metabolic Panel: Recent Labs  Lab 06/14/20 1310  06/19/20 0806 06/20/20 0559  NA 142 140 140  K 2.9* 4.8 3.9  CL 105 104 102  CO2 27 30 29   GLUCOSE 116* 104* 101*  BUN 27* 17 20  CREATININE 0.95 0.85 0.89  CALCIUM 9.0 9.6 9.1   GFR: Estimated Creatinine Clearance: 68.9 mL/min (by C-G formula based on SCr of 0.89 mg/dL). Liver Function Tests: Recent Labs  Lab 06/14/20 1310  AST 15  ALT 13  ALKPHOS 72  BILITOT 1.1  PROT 6.4*  ALBUMIN 3.5   No results for input(s): LIPASE, AMYLASE in the last 168 hours. No results for input(s): AMMONIA in the last 168 hours. Coagulation Profile: No results for input(s): INR, PROTIME in the last 168 hours. Cardiac Enzymes: No  results for input(s): CKTOTAL, CKMB, CKMBINDEX, TROPONINI in the last 168 hours. BNP (last 3 results) No results for input(s): PROBNP in the last 8760 hours. HbA1C: No results for input(s): HGBA1C in the last 72 hours. CBG: No results for input(s): GLUCAP in the last 168 hours. Lipid Profile: No results for input(s): CHOL, HDL, LDLCALC, TRIG, CHOLHDL, LDLDIRECT in the last 72 hours. Thyroid Function Tests: Recent Labs    06/19/20 1538  TSH 0.653   Anemia Panel: Recent Labs    06/19/20 1538  VITAMINB12 133*   Sepsis Labs: No results for input(s): PROCALCITON, LATICACIDVEN in the last 168 hours.  Recent Results (from the past 240 hour(s))  SARS Coronavirus 2 by RT PCR (hospital order, performed in [facility] hospital lab) Nasopharyngeal Nasopharyngeal Swab     Status: None   Collection Time: 06/14/20 11:41 AM   Specimen: Nasopharyngeal Swab  Result Value Ref Range Status   SARS Coronavirus 2 NEGATIVE NEGATIVE Final    Comment: (NOTE) SARS-CoV-2 target nucleic acids are NOT DETECTED.  The SARS-CoV-2 RNA is generally detectable in upper and lower respiratory specimens during the acute phase of infection. The lowest concentration of SARS-CoV-2 viral copies this assay can detect is 250 copies / mL. A negative result does not preclude SARS-CoV-2 infection and should not be used as the sole basis for treatment or other patient management decisions.  A negative result may occur with improper specimen collection / handling, submission of specimen other than nasopharyngeal swab, presence of viral mutation(s) within the areas targeted by this assay, and inadequate number of viral copies (<250 copies / mL). A negative result must be combined with clinical observations, patient history, and epidemiological information.  Fact Sheet for Patients:   StrictlyIdeas.no  Fact Sheet for Healthcare Providers: BankingDealers.co.za  This test  is not yet approved or  cleared by the [place] FDA and has been authorized for detection and/or diagnosis of SARS-CoV-2 by FDA under an Emergency Use Authorization (EUA).  This EUA will remain in effect (meaning this test can be used) for the duration of the COVID-19 declaration under Section 564(b)(1) of the Act, 21 U.S.C. section 360bbb-3(b)(1), unless the authorization is terminated or revoked sooner.  Performed at [facility], [place] [address], [place], [place] [address]   SARS Coronavirus 2 by RT PCR (hospital order, performed in [facility] hospital lab) Nasopharyngeal Nasopharyngeal Swab     Status: None   Collection Time: 06/19/20  4:25 PM   Specimen: Nasopharyngeal Swab  Result Value Ref Range Status   SARS Coronavirus 2 NEGATIVE NEGATIVE Final    Comment: (NOTE) SARS-CoV-2 target nucleic acids are NOT DETECTED.  The SARS-CoV-2 RNA is generally detectable in upper and lower respiratory specimens during the acute phase  of infection. The lowest concentration of SARS-CoV-2 viral copies this assay can detect is 250 copies / mL. A negative result does not preclude SARS-CoV-2 infection and should not be used as the sole basis for treatment or other patient management decisions.  A negative result may occur with improper specimen collection / handling, submission of specimen other than nasopharyngeal swab, presence of viral mutation(s) within the areas targeted by this assay, and inadequate number of viral copies (<250 copies / mL). A negative result must be combined with clinical observations, patient history, and epidemiological information.  Fact Sheet for Patients:   StrictlyIdeas.no  Fact Sheet for Healthcare Providers: BankingDealers.co.za  This test is not yet approved or  cleared by the [place] FDA and has been authorized for detection and/or diagnosis of SARS-CoV-2 by FDA under an Emergency Use  Authorization (EUA).  This EUA will remain in effect (meaning this test can be used) for the duration of the COVID-19 declaration under Section 564(b)(1) of the Act, 21 U.S.C. section 360bbb-3(b)(1), unless the authorization is terminated or revoked sooner.  Performed at [facility], [place] [address], [place], [place] [address]     RN Pressure Injury Documentation:     Estimated body mass index is 36.32 kg/m as calculated from the following:   Height as of an earlier encounter on 06/19/20: 5\' 6"  (1.676 m).   Weight as of 06/14/20: 102.1 kg.  Malnutrition Type:      Malnutrition Characteristics:      Nutrition Interventions:    Radiology Studies: No results found.  Scheduled Meds: . ALPRAZolam  0.25 mg Oral Once  . atorvastatin  20 mg Oral Once per day on Sun Tue Thu Sat  . heparin  5,000 Units Subcutaneous Q8H  . levothyroxine  75 mcg Oral QAC breakfast  . pantoprazole  40 mg Oral Daily  . polyethylene glycol  17 g Oral QHS  . sertraline  200 mg Oral QPM   Continuous Infusions:   LOS: 1 day   Kerney Elbe, DO [facility] PAGER is on [place]  If 7PM-7AM, please contact night-coverage [email or web address]

## 2020-06-20 NOTE — Telephone Encounter (Signed)
No changes made to pt's schedule per 6/29 los.

## 2020-06-20 NOTE — Progress Notes (Signed)
Per bed placement, waiting for available bed. Updated [facility] MRI team on pt transfer status.

## 2020-06-20 NOTE — Progress Notes (Signed)
IP PROGRESS NOTE  Subjective:   Joanne Klein complains of pain at the "back of the knees ".  She reports persistent leg numbness and weakness.  Objective: Vital signs in last 24 hours: Blood pressure (!) 182/110, pulse 80, temperature 97.7 F (36.5 C), temperature source Oral, resp. rate 16, SpO2 91 %.  Intake/Output from previous day: No intake/output data recorded.  Physical Exam:  Lymph nodes: No cervical, supraclavicular, or inguinal nodes Extremities: No leg edema Neurologic: Alert, 2/5 strength with dorsi flexion at the left foot, 4-5 strength with hip flexion-weaker on the right  Portacath/PICC-without erythema  Lab Results: Recent Labs    06/19/20 0806  WBC 5.1  HGB 13.0  HCT 38.0  PLT 118*    BMET Recent Labs    06/19/20 0806 06/20/20 0559  NA 140 140  K 4.8 3.9  CL 104 102  CO2 30 29  GLUCOSE 104* 101*  BUN 17 20  CREATININE 0.85 0.89  CALCIUM 9.6 9.1    Medications: I have reviewed the patient's current medications.  Assessment/Plan:  1. 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 Biopsy 08/30/2019 confirmed well-differentiated squamous cell carcinoma with positive P 16 and p63 stains ? CTs 09/14/2019-abnormal soft tissue fullness at the lower anus/perianal soft tissues, asymmetric left inguinal lymphadenopathy, borderline enlarged left external iliac node, mild stranding and cutaneous thickening of the left gluteal fold, 2 to 3 mm pulmonary nodules, 1.6 cm right hepatic lesion-likely a hemangioma ? PET scan 18/07/[id number] hypermetabolic anal mass. 3 hypermetabolic left inguinal lymph nodes. Left external iliac node with minimally higher than blood pool activity. 1.0 x 0.8 cm left thyroid nodule with activity mildly above background blood pool activity but below liver activity. No perceptible hypermetabolic activity in the vicinity of the lateral right hepatic lobe lesion seen on the 09/14/2019 CT. ? Began concurrent chemoRT with 5FU/mitomycin on  10/03/2019 ? Cycle25-FU, dose reduced, mitomycin held 11/07/2019 ? Radiation completed 11/24/2019 ? CT abdomen/pelvis 12/27/2019-extensive left-sided colonic diverticulosis without diverticulitis. No bowel obstruction. No abscess in the abdomen or pelvis. Rectum borderline distended with stool without perirectal wall thickening or soft tissue stranding. Left inguinal and external iliac lymph nodes now subcentimeter compared to the previous size. Stable small lesion in the right lobe of the liver. No new liver lesions evident. 2. COPD 3. Fibromyalgia 4. Chronic back pain 5. Hypothyroid 6. Depression 7. History of uterine cancer-age 68 8. Pain secondary to #1 9. Orthostatic hypotension, fatigue, dyspnea on exertion, early mucositis requiring supportive care on day 9, secondary to chemotherapy  10. Thrombocytopenia PLT 58K and neutropenia ANC 1.4 on day 9, secondary to chemotherapy 11. Worsening cytopenias, PLT 8K and ANC 0.0 on day 15, secondary to chemotherapy. Started prophylaxis with cipro on 10/22 day 11 12. Hospital admission 10/17/2019 -fatigue, pancytopenia, hypokalemia 13.Left thyroid nodule with activity mildly above background blood pool activity butbelowliver activity on PET scan 09/27/2019. Thyroid ultrasound 219 2021-1.6 cm nodule left inferior thyroid gland.  0/12/930 FNA-benign follicular nodule. 14.Tiny lung nodules noted on chest CT 09/14/2019 15.  Falls, back/leg pain and leg weakness-CT lumbar spine 05/26/2020 with no acute abnormality within the lumbar spine; multilevel degenerative spondylosis with resultant mild canal with bilateral lateral recess stenosis at L4-5; moderate bilateral L4 and L5 foraminal stenosis related to disc bulge, reactive endplate changes and facet degeneration; soft tissue density/stranding involving the presacral space anterior to the sacrum, incompletely visualized  MRI thoracic and lumbar spine 06/14/2020-no mass or fracture, degenerative  changes in the thoracic and lumbar  spine 16.  Vitamin B12 deficiency  Joanne Klein appears unchanged.  She has a history of anal cancer and now presents with pain and lower extremity neurologic deficits.  The vitamin B12 level is low.  I think it is unlikely vitamin B12 deficiency explains all of her symptoms. She is scheduled to undergo a diagnostic lumbar puncture to rule out carcinomatous meningitis.  She will need PT and OT evaluations to see whether she is able to go home.  Recommendations: 1.  Vitamin B12 replacement 2.  Work-up of leg weakness/pain as recommended by neurology 3.  OT/PT evaluation, consider SNF versus home 4.  Please call Oncology as needed   LOS: 1 day   Betsy Coder, MD   06/20/2020, 9:00 AM

## 2020-06-21 ENCOUNTER — Inpatient Hospital Stay (HOSPITAL_COMMUNITY): Payer: Medicare Other | Admitting: Certified Registered"

## 2020-06-21 ENCOUNTER — Inpatient Hospital Stay (HOSPITAL_COMMUNITY): Payer: Medicare Other

## 2020-06-21 ENCOUNTER — Encounter (HOSPITAL_COMMUNITY)
Admission: AD | Disposition: A | Discharge status: Home Health | Payer: Self-pay | Source: Ambulatory Visit | Attending: Internal Medicine

## 2020-06-21 DIAGNOSIS — M5416 Radiculopathy, lumbar region: Secondary | ICD-10-CM

## 2020-06-21 HISTORY — PX: RADIOLOGY WITH ANESTHESIA: SHX6223

## 2020-06-21 SURGERY — MRI WITH ANESTHESIA
Anesthesia: General

## 2020-06-21 MED ORDER — MIDAZOLAM HCL 5 MG/5ML IJ SOLN
INTRAMUSCULAR | Status: DC | PRN
  Administered 2020-06-21: 1 mg via INTRAVENOUS

## 2020-06-21 MED ORDER — FENTANYL CITRATE (PF) 100 MCG/2ML IJ SOLN
INTRAMUSCULAR | Status: AC
  Filled 2020-06-21: qty 2

## 2020-06-21 MED ORDER — PROPOFOL 10 MG/ML IV BOLUS
INTRAVENOUS | Status: DC | PRN
  Administered 2020-06-21: 150 mg via INTRAVENOUS

## 2020-06-21 MED ORDER — LIDOCAINE 2% (20 MG/ML) 5 ML SYRINGE
INTRAMUSCULAR | Status: DC | PRN
  Administered 2020-06-21: 60 mg via INTRAVENOUS

## 2020-06-21 MED ORDER — FENTANYL CITRATE (PF) 100 MCG/2ML IJ SOLN
25.0000 ug | INTRAMUSCULAR | Status: DC | PRN
  Administered 2020-06-21: 25 ug via INTRAVENOUS

## 2020-06-21 MED ORDER — HYDROMORPHONE HCL 1 MG/ML IJ SOLN: 0.5000 mg | INTRAMUSCULAR | Status: DC | PRN

## 2020-06-21 MED ORDER — ONDANSETRON HCL 4 MG/2ML IJ SOLN
INTRAMUSCULAR | Status: DC | PRN
  Administered 2020-06-21: 4 mg via INTRAVENOUS

## 2020-06-21 MED ORDER — FENTANYL CITRATE (PF) 100 MCG/2ML IJ SOLN
INTRAMUSCULAR | Status: DC | PRN
  Administered 2020-06-21 (×3): 50 ug via INTRAVENOUS

## 2020-06-21 MED ORDER — HYDROMORPHONE HCL 1 MG/ML IJ SOLN
0.5000 mg | INTRAMUSCULAR | Status: AC
  Administered 2020-06-21: 0.5 mg via INTRAVENOUS
  Filled 2020-06-21: qty 1

## 2020-06-21 MED ORDER — GADOBUTROL 1 MMOL/ML IV SOLN
9.9000 mL | Freq: Once | INTRAVENOUS | Status: AC | PRN
  Administered 2020-06-21: 9.9 mL via INTRAVENOUS

## 2020-06-21 MED ORDER — ONDANSETRON HCL 4 MG/2ML IJ SOLN: INTRAMUSCULAR | Status: DC | PRN

## 2020-06-21 MED ORDER — HYDROMORPHONE HCL 1 MG/ML IJ SOLN
1.0000 mg | INTRAMUSCULAR | Status: DC | PRN
  Administered 2020-06-21 – 2020-06-23 (×3): 1 mg via INTRAVENOUS
  Filled 2020-06-21 (×3): qty 1

## 2020-06-21 MED ORDER — PHENYLEPHRINE HCL-NACL 10-0.9 MG/250ML-% IV SOLN
INTRAVENOUS | Status: DC | PRN
  Administered 2020-06-21: 30 ug/min via INTRAVENOUS

## 2020-06-21 MED ORDER — LACTATED RINGERS IV SOLN: INTRAVENOUS | Status: DC

## 2020-06-21 NOTE — Progress Notes (Signed)
   06/21/20 1719  Assess: MEWS Score  Temp 99 F (37.2 C)  BP 123/64  Pulse Rate (!) 104  Resp 20  SpO2 99 %  Assess: MEWS Score  MEWS Temp 0  MEWS Systolic 0  MEWS Pulse 1  MEWS RR 0  MEWS LOC 0  MEWS Score 1  MEWS Score Color Green  Assess: if the MEWS score is Yellow or Red  Were vital signs taken at a resting state? Yes  Focused Assessment Documented focused assessment  Early Detection of Sepsis Score *See Row Information* Low  MEWS guidelines implemented *See Row Information* No, vital signs rechecked  Document  Patient Outcome Other (Comment) (Patient vitals rechecked)  Progress note created (see row info) Yes   Patient returned from sedated MRI with yellow MEWS. Vitals rechecked and patient is stable with green MEWS.

## 2020-06-21 NOTE — Anesthesia Preprocedure Evaluation (Signed)
Anesthesia Evaluation  Patient identified by MRN, date of birth, ID band Patient awake    Reviewed: Allergy & Precautions, NPO status , Patient's Chart, lab work & pertinent test results, reviewed documented beta blocker date and time   Airway Mallampati: II  TM Distance: >3 FB Neck ROM: Full    Dental  (+) Poor Dentition, Missing, Caps,    Pulmonary sleep apnea and Continuous Positive Airway Pressure Ventilation , COPD,  COPD inhaler, Patient abstained from smoking., former smoker,    Pulmonary exam normal breath sounds clear to auscultation       Cardiovascular hypertension, Pt. on medications Normal cardiovascular exam Rhythm:Regular Rate:Normal     Neuro/Psych PSYCHIATRIC DISORDERS Anxiety Depression Claustrophobia OCDHx/o post herpetic neuralgia  Neuromuscular disease    GI/Hepatic Neg liver ROS, GERD  Medicated and Controlled,Hx/o anal Ca S/P ChemoRx completed 12/20 IBS   Endo/Other  Hypothyroidism Morbid obesityHypercholesterolemia  Renal/GU negative Renal ROSHypokalemia  negative genitourinary   Musculoskeletal  (+) Arthritis , Osteoarthritis,  Fibromyalgia -  Abdominal (+) + obese,   Peds  Hematology Thrombocytopenia- ChemoRx induced   Anesthesia Other Findings   Reproductive/Obstetrics Hx/o uterine Ca                             Anesthesia Physical  Anesthesia Plan  ASA: III  Anesthesia Plan: General   Post-op Pain Management:    Induction: Intravenous  PONV Risk Score and Plan: 3 and Ondansetron and Treatment may vary due to age or medical condition  Airway Management Planned: LMA  Additional Equipment:   Intra-op Plan:   Post-operative Plan: Extubation in OR  Informed Consent: I have reviewed the patients History and Physical, chart, labs and discussed the procedure including the risks, benefits and alternatives for the proposed anesthesia with the patient or  authorized representative who has indicated his/her understanding and acceptance.     Dental advisory given  Plan Discussed with: CRNA  Anesthesia Plan Comments:         Anesthesia Quick Evaluation

## 2020-06-21 NOTE — Progress Notes (Signed)
PROGRESS NOTE    Joanne Klein  ATF:[id number] DOB: 03-Jan-1946 DOA: 06/19/2020 PCP: Hoyt Koch, MD    Brief Narrative: HPI per Dr. Oretha Milch on 06/19/20 Joanne Klein a 74 y.o.femalewith medical history significant foranal cancer status postradiation and chemotherapy completed on 11/2019, COPD, hypothyroidism, depressionwho presents on 6/29/2021as a direct admission from cancer center withlower extremity weakness, numbness and tingling has been ongoing for 2 months but has progressively worsened in the past week.  She was last evaluated on 6/24 in the ED for similar complaints MRI of lumbar/thoracic spine at that time showed no acute etiologies as imaging was only consistent with chronic degenerative changes and some mild stenosis.  Since then she has had at least3 falls related to her knees locking, having numbness and tingling that starts in her feet with sharp pain behind her knees that radiates up to her thighs. She denies any headache, changes in vision, upper arm weakness, urinary or fecal incontinence, no fevers or chills, no recent sick contacts. 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 Labs were obtained at that office visit which were essentially unremarkable.  Interim history-patient continues to complain of bilateral leg upper and lower leg pain and tingling radiating from her buttocks to the feet.  She is complaining of 10 out of 10 pain and asking for stronger pain medications.  Assessment & Plan:   Active Problems:   Depression   Hypothyroidism   COPD GOLD II    Hyperlipidemia   Morbid obesity (HCC)   Numbness and tingling   Weakness of both legs   Falls frequently   Weakness   #1 bilateral lower extremity weakness numbness and tingling and unsteady gait unable to walk-unclear etiology.  MRI  of the C-spine thoracic spine lumbar spine no significant stenosis and no acute changes.  MRI of the brain chronic small vessel ischemic changes and chronic microhemorrhage in the right posterior frontal white matter LP to be done by interventional radiology.  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 Benay Spice.  #3 B12 deficiency B12 low end of normal at 133 being repleted  #4 history of depression anxiety continue home meds sertraline and Xanax  #5 hypothyroidism normal TSH continue Synthroid  #6 history of essential hypertension continue hydralazine  #7 history of hyperlipidemia on atorvastatin  #8 history of COPD stable continue nebs   #9 goals of care patient is DNR on admission    Estimated body mass index is 35.41 kg/m as calculated from the following:   Height as of an earlier encounter on 06/19/20: 5\' 6"  (1.676 m).   Weight as of this encounter: 99.5 kg.  DVT prophylaxis: Heparin Code Status: DNR Family Communication: None at bedside attempted to call the daughter with no response Disposition Plan:  Status is: Inpatient  Dispo: The patient is from: home              Anticipated d/c is YH:[id number]              Anticipated d/c date is: Unknown              Patient currently is not medically stable to d/c.  Patient admitted with bilateral lower extremity weakness new onset undergoing work-up followed by neurology    Consultants: Oncology and neurology  Procedures: None Antimicrobials: None  Subjective: She is crying  out of pain 10 out of 10 asking for pain medications  Objective: Vitals:   06/21/20 1451 06/21/20 1455 06/21/20 1503 06/21/20 1506  BP: 138/78   (!) 144/82  Pulse: (!) 105 (!) 102 (!) 102 (!) 104  Resp: (!) 32 (!) 25 15 (!) 22  Temp:    97.7 F (36.5 C)  TempSrc:      SpO2: 97% 96%  96%  Weight:        Intake/Output Summary (Last 24 hours) at 06/21/2020 1545 Last data filed at 06/21/2020  1500 Gross per 24 hour  Intake 100 ml  Output --  Net 100 ml   Filed Weights   06/21/20 0616  Weight: 99.5 kg    Examination:  General exam: Appears calm and comfortable  Respiratory system: Clear to auscultation. Respiratory effort normal. Cardiovascular system: S1 & S2 heard, RRR. No JVD, murmurs, rubs, gallops or clicks. No pedal edema. Gastrointestinal system: Abdomen is nondistended, soft and nontender. No organomegaly or masses felt. Normal bowel sounds heard. Central nervous system: Alert and oriented.  Moves upper extremities freely move the right lower extremity with some difficulty and the left lower extremity movement is much less compared to the right  extremities: No edema Skin: No rashes, lesions or ulcers Psychiatry: Judgement and insight appear normal. Mood & affect appropriate.   Data Reviewed: I have personally reviewed following labs and imaging studies  CBC: Recent Labs  Lab 06/19/20 0806 06/20/20 0559  WBC 5.1 4.3  NEUTROABS 3.6  --   HGB 13.0 12.0  HCT 38.0 35.1*  MCV 97.2 96.2  PLT 118* 017*   Basic Metabolic Panel: Recent Labs  Lab 06/19/20 0806 06/20/20 0559  NA 140 140  K 4.8 3.9  CL 104 102  CO2 30 29  GLUCOSE 104* 101*  BUN 17 20  CREATININE 0.85 0.89  CALCIUM 9.6 9.1   GFR: Estimated Creatinine Clearance: 68 mL/min (by C-G formula based on SCr of 0.89 mg/dL). Liver Function Tests: No results for input(s): AST, ALT, ALKPHOS, BILITOT, PROT, ALBUMIN in the last 168 hours. No results for input(s): LIPASE, AMYLASE in the last 168 hours. No results for input(s): AMMONIA in the last 168 hours. Coagulation Profile: No results for input(s): INR, PROTIME in the last 168 hours. Cardiac Enzymes: No results for input(s): CKTOTAL, CKMB, CKMBINDEX, TROPONINI in the last 168 hours. BNP (last 3 results) No results for input(s): PROBNP in the last 8760 hours. HbA1C: No results for input(s): HGBA1C in the last 72 hours. CBG: No results for  input(s): GLUCAP in the last 168 hours. Lipid Profile: No results for input(s): CHOL, HDL, LDLCALC, TRIG, CHOLHDL, LDLDIRECT in the last 72 hours. Thyroid Function Tests: Recent Labs    06/19/20 1538  TSH 0.653   Anemia Panel: Recent Labs    06/19/20 1538  VITAMINB12 133*   Sepsis Labs: No results for input(s): PROCALCITON, LATICACIDVEN in the last 168 hours.  Recent Results (from the past 240 hour(s))  SARS Coronavirus 2 by RT PCR (hospital order, performed in [facility] hospital lab) Nasopharyngeal Nasopharyngeal Swab     Status: None   Collection Time: 06/14/20 11:41 AM   Specimen: Nasopharyngeal Swab  Result Value Ref Range Status   SARS Coronavirus 2 NEGATIVE NEGATIVE Final    Comment: (NOTE) SARS-CoV-2 target nucleic acids are NOT DETECTED.  The SARS-CoV-2 RNA is generally detectable in upper and lower respiratory specimens during the acute phase of infection. The lowest concentration of SARS-CoV-2 viral copies this assay  can detect is 250 copies / mL. A negative result does not preclude SARS-CoV-2 infection and should not be used as the sole basis for treatment or other patient management decisions.  A negative result may occur with improper specimen collection / handling, submission of specimen other than nasopharyngeal swab, presence of viral mutation(s) within the areas targeted by this assay, and inadequate number of viral copies (<250 copies / mL). A negative result must be combined with clinical observations, patient history, and epidemiological information.  Fact Sheet for Patients:   StrictlyIdeas.no  Fact Sheet for Healthcare Providers: BankingDealers.co.za  This test is not yet approved or  cleared by the [place] FDA and has been authorized for detection and/or diagnosis of SARS-CoV-2 by FDA under an Emergency Use Authorization (EUA).  This EUA will remain in effect (meaning this test can be used)  for the duration of the COVID-19 declaration under Section 564(b)(1) of the Act, 21 U.S.C. section 360bbb-3(b)(1), unless the authorization is terminated or revoked sooner.  Performed at [facility], [place] [address] [place], [place] [address]   SARS Coronavirus 2 by RT PCR (hospital order, performed in [facility] hospital lab) Nasopharyngeal Nasopharyngeal Swab     Status: None   Collection Time: 06/19/20  4:25 PM   Specimen: Nasopharyngeal Swab  Result Value Ref Range Status   SARS Coronavirus 2 NEGATIVE NEGATIVE Final    Comment: (NOTE) SARS-CoV-2 target nucleic acids are NOT DETECTED.  The SARS-CoV-2 RNA is generally detectable in upper and lower respiratory specimens during the acute phase of infection. The lowest concentration of SARS-CoV-2 viral copies this assay can detect is 250 copies / mL. A negative result does not preclude SARS-CoV-2 infection and should not be used as the sole basis for treatment or other patient management decisions.  A negative result may occur with improper specimen collection / handling, submission of specimen other than nasopharyngeal swab, presence of viral mutation(s) within the areas targeted by this assay, and inadequate number of viral copies (<250 copies / mL). A negative result must be combined with clinical observations, patient history, and epidemiological information.  Fact Sheet for Patients:   StrictlyIdeas.no  Fact Sheet for Healthcare Providers: BankingDealers.co.za  This test is not yet approved or  cleared by the [place] FDA and has been authorized for detection and/or diagnosis of SARS-CoV-2 by FDA under an Emergency Use Authorization (EUA).  This EUA will remain in effect (meaning this test can be used) for the duration of the COVID-19 declaration under Section 564(b)(1) of the Act, 21 U.S.C. section 360bbb-3(b)(1), unless the authorization is terminated or revoked  sooner.  Performed at [facility], [place] [address] [place], [place] [address]          Radiology Studies: MR BRAIN W WO CONTRAST  Result Date: 06/21/2020 CLINICAL DATA:  Subacute neuro deficit EXAM: MRI HEAD WITHOUT AND WITH CONTRAST TECHNIQUE: Multiplanar, multiecho pulse sequences of the brain and surrounding structures were obtained without and with intravenous contrast. CONTRAST:  9.29mL GADAVIST GADOBUTROL 1 MMOL/ML IV SOLN COMPARISON:  None. FINDINGS: Brain: Mild atrophy. Negative for hydrocephalus. Patchy white matter hyperintensity bilaterally. Patchy hyperintensity in the pons bilaterally. Negative for acute infarct or mass. Chronic microhemorrhage right frontal white matter. No enhancing lesion identified postcontrast. Vascular: Normal arterial flow voids. Skull and upper cervical spine: No focal skeletal lesion. Sinuses/Orbits: Mild mucosal edema paranasal sinuses. Negative orbit. Other: None IMPRESSION: No acute abnormality Atrophy and mild to moderate chronic microvascular ischemic change in  the white matter and pons. Chronic microhemorrhage right posterior frontal white matter. Electronically Signed   By: Franchot Gallo M.D.   On: 06/21/2020 15:26   MR THORACIC SPINE W CONTRAST  Result Date: 06/21/2020 CLINICAL DATA:  Anal cancer post radiation and chemotherapy, lower extremity weakness, numbness and tingling with progressive worsening; multiple falls since prior noncontrast study EXAM: MRI CERVICAL WITHOUT AND WITH CONTRAST THORACIC AND LUMBAR SPINE WITH CONTRAST TECHNIQUE: Multiplanar and multiecho pulse sequences of the cervical spine, to include the craniocervical junction and cervicothoracic junction, and thoracic and lumbar spine, were obtained without and with intravenous contrast. Multiplanar multi-echo sequences of the thoracic and lumbar spine were obtained with intravenous contrast. CONTRAST:  9.9 mL Gadavist COMPARISON:  Noncontrast studies 06/14/2020,  03/08/2019 FINDINGS: MRI CERVICAL SPINE Alignment: Stable. Vertebrae: Stable vertebral body heights. No new marrow edema suspicious osseous lesion. Cord: No abnormal signal.  No abnormal intrathecal enhancement. Posterior Fossa, vertebral arteries, paraspinal tissues: Unremarkable. Disc levels: Degenerative changes primarily from C4-C5 to C6-C7 are stable in appearance including small left central disc protrusion at C5-C6. There is stable left greater than right foraminal stenosis at this level. MRI THORACIC SPINE Alignment:  Stable Vertebrae: Stable vertebral body heights. There is minor degenerative marrow enhancement. No suspicious osseous lesion. Cord:  No abnormal intrathecal enhancement Paraspinal and other soft tissues: Unremarkable. Disc levels: Multilevel degenerative changes better evaluated on prior study. MRI LUMBAR SPINE Alignment:  Stable. Vertebrae: Stable vertebral body heights. Minor degenerative marrow enhancement. No suspicious osseous lesion. Conus medullaris and cauda equina: No abnormal intrathecal enhancement. Conus and cauda equina appear normal. Paraspinal and other soft tissues: Unremarkable. Disc levels: Multilevel degenerative changes better evaluated prior study. IMPRESSION: No abnormal cervical cord signal. Mild cervical degenerative changes without high-grade stenosis. No significant abnormal enhancement in the thoracolumbar spine. Electronically Signed   By: Macy Mis M.D.   On: 06/21/2020 15:24   MR LUMBAR SPINE W CONTRAST  Result Date: 06/21/2020 CLINICAL DATA:  Anal cancer post radiation and chemotherapy, lower extremity weakness, numbness and tingling with progressive worsening; multiple falls since prior noncontrast study EXAM: MRI CERVICAL WITHOUT AND WITH CONTRAST THORACIC AND LUMBAR SPINE WITH CONTRAST TECHNIQUE: Multiplanar and multiecho pulse sequences of the cervical spine, to include the craniocervical junction and cervicothoracic junction, and thoracic and lumbar  spine, were obtained without and with intravenous contrast. Multiplanar multi-echo sequences of the thoracic and lumbar spine were obtained with intravenous contrast. CONTRAST:  9.9 mL Gadavist COMPARISON:  Noncontrast studies 06/14/2020, 03/08/2019 FINDINGS: MRI CERVICAL SPINE Alignment: Stable. Vertebrae: Stable vertebral body heights. No new marrow edema suspicious osseous lesion. Cord: No abnormal signal.  No abnormal intrathecal enhancement. Posterior Fossa, vertebral arteries, paraspinal tissues: Unremarkable. Disc levels: Degenerative changes primarily from C4-C5 to C6-C7 are stable in appearance including small left central disc protrusion at C5-C6. There is stable left greater than right foraminal stenosis at this level. MRI THORACIC SPINE Alignment:  Stable Vertebrae: Stable vertebral body heights. There is minor degenerative marrow enhancement. No suspicious osseous lesion. Cord:  No abnormal intrathecal enhancement Paraspinal and other soft tissues: Unremarkable. Disc levels: Multilevel degenerative changes better evaluated on prior study. MRI LUMBAR SPINE Alignment:  Stable. Vertebrae: Stable vertebral body heights. Minor degenerative marrow enhancement. No suspicious osseous lesion. Conus medullaris and cauda equina: No abnormal intrathecal enhancement. Conus and cauda equina appear normal. Paraspinal and other soft tissues: Unremarkable. Disc levels: Multilevel degenerative changes better evaluated prior study. IMPRESSION: No abnormal cervical cord signal. Mild cervical degenerative changes without high-grade stenosis.  No significant abnormal enhancement in the thoracolumbar spine. Electronically Signed   By: Macy Mis M.D.   On: 06/21/2020 15:24   MR CERVICAL SPINE W WO CONTRAST  Result Date: 06/21/2020 CLINICAL DATA:  Anal cancer post radiation and chemotherapy, lower extremity weakness, numbness and tingling with progressive worsening; multiple falls since prior noncontrast study EXAM: MRI  CERVICAL WITHOUT AND WITH CONTRAST THORACIC AND LUMBAR SPINE WITH CONTRAST TECHNIQUE: Multiplanar and multiecho pulse sequences of the cervical spine, to include the craniocervical junction and cervicothoracic junction, and thoracic and lumbar spine, were obtained without and with intravenous contrast. Multiplanar multi-echo sequences of the thoracic and lumbar spine were obtained with intravenous contrast. CONTRAST:  9.9 mL Gadavist COMPARISON:  Noncontrast studies 06/14/2020, 03/08/2019 FINDINGS: MRI CERVICAL SPINE Alignment: Stable. Vertebrae: Stable vertebral body heights. No new marrow edema suspicious osseous lesion. Cord: No abnormal signal.  No abnormal intrathecal enhancement. Posterior Fossa, vertebral arteries, paraspinal tissues: Unremarkable. Disc levels: Degenerative changes primarily from C4-C5 to C6-C7 are stable in appearance including small left central disc protrusion at C5-C6. There is stable left greater than right foraminal stenosis at this level. MRI THORACIC SPINE Alignment:  Stable Vertebrae: Stable vertebral body heights. There is minor degenerative marrow enhancement. No suspicious osseous lesion. Cord:  No abnormal intrathecal enhancement Paraspinal and other soft tissues: Unremarkable. Disc levels: Multilevel degenerative changes better evaluated on prior study. MRI LUMBAR SPINE Alignment:  Stable. Vertebrae: Stable vertebral body heights. Minor degenerative marrow enhancement. No suspicious osseous lesion. Conus medullaris and cauda equina: No abnormal intrathecal enhancement. Conus and cauda equina appear normal. Paraspinal and other soft tissues: Unremarkable. Disc levels: Multilevel degenerative changes better evaluated prior study. IMPRESSION: No abnormal cervical cord signal. Mild cervical degenerative changes without high-grade stenosis. No significant abnormal enhancement in the thoracolumbar spine. Electronically Signed   By: Macy Mis M.D.   On: 06/21/2020 15:24         Scheduled Meds: . atorvastatin  20 mg Oral Once per day on Sun Tue Thu Sat  . fentaNYL      . heparin  5,000 Units Subcutaneous Q8H  . levothyroxine  75 mcg Oral QAC breakfast  . pantoprazole  40 mg Oral Daily  . polyethylene glycol  17 g Oral QHS  . sertraline  200 mg Oral QPM   Continuous Infusions: . lactated ringers 600 mL/hr at 06/21/20 1420     LOS: 2 days     Georgette Shell, MD  06/21/2020, 3:45 PM

## 2020-06-21 NOTE — Social Work (Signed)
CSW attempted to see pt, pt off floor for procedure. Will re-visit as able later today.   Westley Hummer, MSW, [place] [employer]

## 2020-06-21 NOTE — Transfer of Care (Signed)
Immediate Anesthesia Transfer of Care Note  Patient: Joanne Klein  Procedure(s) Performed: MRI WITH ANESTHESIA (N/A )  Patient Location: PACU  Anesthesia Type:General  Level of Consciousness: awake, alert  and oriented  Airway & Oxygen Therapy: Patient Spontanous Breathing  Post-op Assessment: Report given to RN and Post -op Vital signs reviewed and stable  Post vital signs: Reviewed and stable  Last Vitals:  Vitals Value Taken Time  BP 152/74 06/21/20 1436  Temp    Pulse 106 06/21/20 1443  Resp 27 06/21/20 1443  SpO2 96 % 06/21/20 1443  Vitals shown include unvalidated device data.  Last Pain:  Vitals:   06/21/20 0749  TempSrc:   PainSc: 10-Worst pain ever      Patients Stated Pain Goal: 4 (47/09/62 [id number])  Complications: No complications documented.

## 2020-06-21 NOTE — Anesthesia Procedure Notes (Signed)
Procedure Name: LMA Insertion Date/Time: 06/21/2020 11:40 AM Performed by: Griffin Dakin, CRNA Pre-anesthesia Checklist: Patient identified, Emergency Drugs available, Suction available and Patient being monitored Patient Re-evaluated:Patient Re-evaluated prior to induction Oxygen Delivery Method: Circle system utilized Preoxygenation: Pre-oxygenation with 100% oxygen Induction Type: IV induction Ventilation: Mask ventilation without difficulty LMA: LMA inserted LMA Size: 4.0 Number of attempts: 1 Airway Equipment and Method: Oral airway Placement Confirmation: positive ETCO2 and breath sounds checked- equal and bilateral Tube secured with: Tape Dental Injury: Teeth and Oropharynx as per pre-operative assessment

## 2020-06-21 NOTE — Anesthesia Postprocedure Evaluation (Signed)
Anesthesia Post Note  Patient: Joanne Klein  Procedure(s) Performed: MRI WITH ANESTHESIA (N/A )     Patient location during evaluation: PACU Anesthesia Type: General Level of consciousness: awake and alert Pain management: pain level controlled Vital Signs Assessment: post-procedure vital signs reviewed and stable Respiratory status: spontaneous breathing, nonlabored ventilation, respiratory function stable and patient connected to nasal cannula oxygen Cardiovascular status: blood pressure returned to baseline and stable Postop Assessment: no apparent nausea or vomiting Anesthetic complications: no   No complications documented.  Last Vitals:  Vitals:   06/21/20 1503 06/21/20 1506  BP:  (!) 144/82  Pulse: (!) 102 (!) 104  Resp: 15 (!) 22  Temp:  36.5 C  SpO2:  96%    Last Pain:  Vitals:   06/21/20 0749  TempSrc:   PainSc: 10-Worst pain ever                 Tiajuana Amass

## 2020-06-22 ENCOUNTER — Encounter (HOSPITAL_COMMUNITY): Payer: Self-pay | Admitting: Radiology

## 2020-06-22 DIAGNOSIS — R52 Pain, unspecified: Secondary | ICD-10-CM

## 2020-06-22 DIAGNOSIS — Z9181 History of falling: Secondary | ICD-10-CM

## 2020-06-22 DIAGNOSIS — M609 Myositis, unspecified: Secondary | ICD-10-CM

## 2020-06-22 DIAGNOSIS — R296 Repeated falls: Secondary | ICD-10-CM

## 2020-06-22 LAB — CBC
HCT: 36.1 % (ref 36.0–46.0)
Hemoglobin: 12.1 g/dL (ref 12.0–15.0)
MCH: 32.5 pg (ref 26.0–34.0)
MCHC: 33.5 g/dL (ref 30.0–36.0)
MCV: 97 fL (ref 80.0–100.0)
Platelets: 112 10*3/uL — ABNORMAL LOW (ref 150–400)
RBC: 3.72 MIL/uL — ABNORMAL LOW (ref 3.87–5.11)
RDW: 13.3 % (ref 11.5–15.5)
WBC: 4.8 10*3/uL (ref 4.0–10.5)
nRBC: 0 % (ref 0.0–0.2)

## 2020-06-22 LAB — COMPREHENSIVE METABOLIC PANEL
ALT: 13 U/L (ref 0–44)
AST: 18 U/L (ref 15–41)
Albumin: 3.3 g/dL — ABNORMAL LOW (ref 3.5–5.0)
Alkaline Phosphatase: 72 U/L (ref 38–126)
Anion gap: 8 (ref 5–15)
BUN: 30 mg/dL — ABNORMAL HIGH (ref 8–23)
CO2: 27 mmol/L (ref 22–32)
Calcium: 8.9 mg/dL (ref 8.9–10.3)
Chloride: 103 mmol/L (ref 98–111)
Creatinine, Ser: 1.13 mg/dL — ABNORMAL HIGH (ref 0.44–1.00)
GFR calc Af Amer: 56 mL/min — ABNORMAL LOW (ref >60)
GFR calc non Af Amer: 49 mL/min — ABNORMAL LOW (ref >60)
Glucose, Bld: 115 mg/dL — ABNORMAL HIGH (ref 70–99)
Potassium: 3.9 mmol/L (ref 3.5–5.1)
Sodium: 138 mmol/L (ref 135–145)
Total Bilirubin: 1.7 mg/dL — ABNORMAL HIGH (ref 0.3–1.2)
Total Protein: 6 g/dL — ABNORMAL LOW (ref 6.5–8.1)

## 2020-06-22 LAB — CK: Total CK: 85 U/L (ref 38–234)

## 2020-06-22 MED ORDER — CYANOCOBALAMIN 1000 MCG/ML IJ SOLN
1000.0000 ug | Freq: Every day | INTRAMUSCULAR | Status: DC
  Administered 2020-06-22 – 2020-06-23 (×2): 1000 ug via INTRAMUSCULAR
  Filled 2020-06-22 (×2): qty 1

## 2020-06-22 NOTE — Progress Notes (Addendum)
Subjective: Patient in bed asleep, easily arouses to voice. C/o generalized weakness and B leg pain.  Objective: Current vital signs: BP 133/75 (BP Location: Right Arm)   Pulse 99   Temp 98.9 F (37.2 C) (Oral)   Resp 16   Wt 99.1 kg   LMP  (LMP Unknown)   SpO2 94%   BMI 35.26 kg/m  Vital signs in last 24 hours: Temp:  [97.7 F (36.5 C)-99 F (37.2 C)] 98.9 F (37.2 C) (07/02 0600) Pulse Rate:  [99-105] 99 (07/02 0600) Resp:  [12-34] 16 (07/02 0646) BP: (121-152)/(64-82) 133/75 (07/02 0600) SpO2:  [94 %-99 %] 94 % (07/02 0600) Weight:  [99.1 kg-99.4 kg] 99.1 kg (07/02 0500)  Intake/Output from previous day: 07/01 0701 - 07/02 0700 In: 150 [P.O.:50; I.V.:100] Out: -  Intake/Output this shift: No intake/output data recorded. Nutritional status:  Diet Order            Diet regular Room service appropriate? Yes; Fluid consistency: Thin  Diet effective now                 Neurologic Exam: Ment: Alert, oriented to all, follows commands. CN: PERRL, face symmetric. EOMI,Tongue protrudes midline. Motor: Able to raise BUE anti-gravity. BLE: Uunable to raise antigravity, but able to bend both her knees and push herself up in bed. Sensory: Decreased sensation to LT in LLE. BUE and face intact to LT.  DTR: LLE:  Brisk 2+. RLE: unable to elicit response BUE: 2+ brisk Cerebellar: FNF intact  Lab Results: No results found for this or any previous visit (from the past 48 hour(s)).  Recent Results (from the past 240 hour(s))  SARS Coronavirus 2 by RT PCR (hospital order, performed in [facility] hospital lab) Nasopharyngeal Nasopharyngeal Swab     Status: None   Collection Time: 06/14/20 11:41 AM   Specimen: Nasopharyngeal Swab  Result Value Ref Range Status   SARS Coronavirus 2 NEGATIVE NEGATIVE Final    Comment: (NOTE) SARS-CoV-2 target nucleic acids are NOT DETECTED.  The SARS-CoV-2 RNA is generally detectable in upper and lower respiratory specimens during the acute  phase of infection. The lowest concentration of SARS-CoV-2 viral copies this assay can detect is 250 copies / mL. A negative result does not preclude SARS-CoV-2 infection and should not be used as the sole basis for treatment or other patient management decisions.  A negative result may occur with improper specimen collection / handling, submission of specimen other than nasopharyngeal swab, presence of viral mutation(s) within the areas targeted by this assay, and inadequate number of viral copies (<250 copies / mL). A negative result must be combined with clinical observations, patient history, and epidemiological information.  Fact Sheet for Patients:   StrictlyIdeas.no  Fact Sheet for Healthcare Providers: BankingDealers.co.za  This test is not yet approved or  cleared by the [place] FDA and has been authorized for detection and/or diagnosis of SARS-CoV-2 by FDA under an Emergency Use Authorization (EUA).  This EUA will remain in effect (meaning this test can be used) for the duration of the COVID-19 declaration under Section 564(b)(1) of the Act, 21 U.S.C. section 360bbb-3(b)(1), unless the authorization is terminated or revoked sooner.  Performed at [facility], [place] [address] [place], [place] [address]   SARS Coronavirus 2 by RT PCR (hospital order, performed in [facility] hospital lab) Nasopharyngeal Nasopharyngeal Swab     Status: None   Collection Time: 06/19/20  4:25 PM   Specimen: Nasopharyngeal Swab  Result Value Ref Range Status   SARS Coronavirus 2 NEGATIVE NEGATIVE Final    Comment: (NOTE) SARS-CoV-2 target nucleic acids are NOT DETECTED.  The SARS-CoV-2 RNA is generally detectable in upper and lower respiratory specimens during the acute phase of infection. The lowest concentration of SARS-CoV-2 viral copies this assay can detect is 250 copies / mL. A negative result does not preclude SARS-CoV-2  infection and should not be used as the sole basis for treatment or other patient management decisions.  A negative result may occur with improper specimen collection / handling, submission of specimen other than nasopharyngeal swab, presence of viral mutation(s) within the areas targeted by this assay, and inadequate number of viral copies (<250 copies / mL). A negative result must be combined with clinical observations, patient history, and epidemiological information.  Fact Sheet for Patients:   StrictlyIdeas.no  Fact Sheet for Healthcare Providers: BankingDealers.co.za  This test is not yet approved or  cleared by the [place] FDA and has been authorized for detection and/or diagnosis of SARS-CoV-2 by FDA under an Emergency Use Authorization (EUA).  This EUA will remain in effect (meaning this test can be used) for the duration of the COVID-19 declaration under Section 564(b)(1) of the Act, 21 U.S.C. section 360bbb-3(b)(1), unless the authorization is terminated or revoked sooner.  Performed at [facility], [place] [address] [place], [place] [address]     Lipid Panel No results for input(s): CHOL, TRIG, HDL, CHOLHDL, VLDL, LDLCALC in the last 72 hours.  Studies/Results: MR BRAIN W WO CONTRAST  Result Date: 06/21/2020 CLINICAL DATA:  Subacute neuro deficit EXAM: MRI HEAD WITHOUT AND WITH CONTRAST TECHNIQUE: Multiplanar, multiecho pulse sequences of the brain and surrounding structures were obtained without and with intravenous contrast. CONTRAST:  9.24mL GADAVIST GADOBUTROL 1 MMOL/ML IV SOLN COMPARISON:  None. FINDINGS: Brain: Mild atrophy. Negative for hydrocephalus. Patchy white matter hyperintensity bilaterally. Patchy hyperintensity in the pons bilaterally. Negative for acute infarct or mass. Chronic microhemorrhage right frontal white matter. No enhancing lesion identified postcontrast. Vascular: Normal  arterial flow voids. Skull and upper cervical spine: No focal skeletal lesion. Sinuses/Orbits: Mild mucosal edema paranasal sinuses. Negative orbit. Other: None IMPRESSION: No acute abnormality Atrophy and mild to moderate chronic microvascular ischemic change in the white matter and pons. Chronic microhemorrhage right posterior frontal white matter. Electronically Signed   By: Franchot Gallo M.D.   On: 06/21/2020 15:26   MR THORACIC SPINE W CONTRAST  Result Date: 06/21/2020 CLINICAL DATA:  Anal cancer post radiation and chemotherapy, lower extremity weakness, numbness and tingling with progressive worsening; multiple falls since prior noncontrast study EXAM: MRI CERVICAL WITHOUT AND WITH CONTRAST THORACIC AND LUMBAR SPINE WITH CONTRAST TECHNIQUE: Multiplanar and multiecho pulse sequences of the cervical spine, to include the craniocervical junction and cervicothoracic junction, and thoracic and lumbar spine, were obtained without and with intravenous contrast. Multiplanar multi-echo sequences of the thoracic and lumbar spine were obtained with intravenous contrast. CONTRAST:  9.9 mL Gadavist COMPARISON:  Noncontrast studies 06/14/2020, 03/08/2019 FINDINGS: MRI CERVICAL SPINE Alignment: Stable. Vertebrae: Stable vertebral body heights. No new marrow edema suspicious osseous lesion. Cord: No abnormal signal.  No abnormal intrathecal enhancement. Posterior Fossa, vertebral arteries, paraspinal tissues: Unremarkable. Disc levels: Degenerative changes primarily from C4-C5 to C6-C7 are stable in appearance including small left central disc protrusion at C5-C6. There is stable left greater than right foraminal stenosis at this level. MRI THORACIC SPINE Alignment:  Stable Vertebrae: Stable vertebral body heights. There is minor degenerative marrow  enhancement. No suspicious osseous lesion. Cord:  No abnormal intrathecal enhancement Paraspinal and other soft tissues: Unremarkable. Disc levels: Multilevel degenerative  changes better evaluated on prior study. MRI LUMBAR SPINE Alignment:  Stable. Vertebrae: Stable vertebral body heights. Minor degenerative marrow enhancement. No suspicious osseous lesion. Conus medullaris and cauda equina: No abnormal intrathecal enhancement. Conus and cauda equina appear normal. Paraspinal and other soft tissues: Unremarkable. Disc levels: Multilevel degenerative changes better evaluated prior study. IMPRESSION: No abnormal cervical cord signal. Mild cervical degenerative changes without high-grade stenosis. No significant abnormal enhancement in the thoracolumbar spine. Electronically Signed   By: Macy Mis M.D.   On: 06/21/2020 15:24   MR LUMBAR SPINE W CONTRAST  Result Date: 06/21/2020 CLINICAL DATA:  Anal cancer post radiation and chemotherapy, lower extremity weakness, numbness and tingling with progressive worsening; multiple falls since prior noncontrast study EXAM: MRI CERVICAL WITHOUT AND WITH CONTRAST THORACIC AND LUMBAR SPINE WITH CONTRAST TECHNIQUE: Multiplanar and multiecho pulse sequences of the cervical spine, to include the craniocervical junction and cervicothoracic junction, and thoracic and lumbar spine, were obtained without and with intravenous contrast. Multiplanar multi-echo sequences of the thoracic and lumbar spine were obtained with intravenous contrast. CONTRAST:  9.9 mL Gadavist COMPARISON:  Noncontrast studies 06/14/2020, 03/08/2019 FINDINGS: MRI CERVICAL SPINE Alignment: Stable. Vertebrae: Stable vertebral body heights. No new marrow edema suspicious osseous lesion. Cord: No abnormal signal.  No abnormal intrathecal enhancement. Posterior Fossa, vertebral arteries, paraspinal tissues: Unremarkable. Disc levels: Degenerative changes primarily from C4-C5 to C6-C7 are stable in appearance including small left central disc protrusion at C5-C6. There is stable left greater than right foraminal stenosis at this level. MRI THORACIC SPINE Alignment:  Stable Vertebrae:  Stable vertebral body heights. There is minor degenerative marrow enhancement. No suspicious osseous lesion. Cord:  No abnormal intrathecal enhancement Paraspinal and other soft tissues: Unremarkable. Disc levels: Multilevel degenerative changes better evaluated on prior study. MRI LUMBAR SPINE Alignment:  Stable. Vertebrae: Stable vertebral body heights. Minor degenerative marrow enhancement. No suspicious osseous lesion. Conus medullaris and cauda equina: No abnormal intrathecal enhancement. Conus and cauda equina appear normal. Paraspinal and other soft tissues: Unremarkable. Disc levels: Multilevel degenerative changes better evaluated prior study. IMPRESSION: No abnormal cervical cord signal. Mild cervical degenerative changes without high-grade stenosis. No significant abnormal enhancement in the thoracolumbar spine. Electronically Signed   By: Macy Mis M.D.   On: 06/21/2020 15:24   MR CERVICAL SPINE W WO CONTRAST  Result Date: 06/21/2020 CLINICAL DATA:  Anal cancer post radiation and chemotherapy, lower extremity weakness, numbness and tingling with progressive worsening; multiple falls since prior noncontrast study EXAM: MRI CERVICAL WITHOUT AND WITH CONTRAST THORACIC AND LUMBAR SPINE WITH CONTRAST TECHNIQUE: Multiplanar and multiecho pulse sequences of the cervical spine, to include the craniocervical junction and cervicothoracic junction, and thoracic and lumbar spine, were obtained without and with intravenous contrast. Multiplanar multi-echo sequences of the thoracic and lumbar spine were obtained with intravenous contrast. CONTRAST:  9.9 mL Gadavist COMPARISON:  Noncontrast studies 06/14/2020, 03/08/2019 FINDINGS: MRI CERVICAL SPINE Alignment: Stable. Vertebrae: Stable vertebral body heights. No new marrow edema suspicious osseous lesion. Cord: No abnormal signal.  No abnormal intrathecal enhancement. Posterior Fossa, vertebral arteries, paraspinal tissues: Unremarkable. Disc levels:  Degenerative changes primarily from C4-C5 to C6-C7 are stable in appearance including small left central disc protrusion at C5-C6. There is stable left greater than right foraminal stenosis at this level. MRI THORACIC SPINE Alignment:  Stable Vertebrae: Stable vertebral body heights. There is minor degenerative marrow enhancement. No  suspicious osseous lesion. Cord:  No abnormal intrathecal enhancement Paraspinal and other soft tissues: Unremarkable. Disc levels: Multilevel degenerative changes better evaluated on prior study. MRI LUMBAR SPINE Alignment:  Stable. Vertebrae: Stable vertebral body heights. Minor degenerative marrow enhancement. No suspicious osseous lesion. Conus medullaris and cauda equina: No abnormal intrathecal enhancement. Conus and cauda equina appear normal. Paraspinal and other soft tissues: Unremarkable. Disc levels: Multilevel degenerative changes better evaluated prior study. IMPRESSION: No abnormal cervical cord signal. Mild cervical degenerative changes without high-grade stenosis. No significant abnormal enhancement in the thoracolumbar spine. Electronically Signed   By: Macy Mis M.D.   On: 06/21/2020 15:24   MR PELVIS W WO CONTRAST  Result Date: 06/21/2020 CLINICAL DATA:  Anal cancer with prior radiation therapy and chemotherapy common a numbness and tingling in the lower extremities with weakness. EXAM: MRI PELVIS WITHOUT AND WITH CONTRAST TECHNIQUE: Multiplanar multisequence MR imaging of the pelvis was performed both before and after administration of intravenous contrast. CONTRAST:  9.21mL GADAVIST GADOBUTROL 1 MMOL/ML IV SOLN COMPARISON:  CT pelvis 12/27/2019 FINDINGS: Urinary Tract:  Unremarkable Bowel: Extensive sigmoid colon diverticulosis, no active diverticulitis identified. No clearly defined mass or asymmetry in the anal/perianal region or in the adjacent rectum. Please note that today's exam was not performed using the rectal protocol. Vascular/Lymphatic: Common  iliac atherosclerosis. No pathologic adenopathy identified. Reproductive:  Uterus absent.  Adnexa unremarkable. Other:  No supplemental non-categorized findings. Musculoskeletal: There is mild presacral edema along with edema tracking along and within the iliacus muscles, piriformis muscles, central gluteus muscles, obturator internus muscles, and bilateral hip adductor musculature in a symmetric fashion. This edema is also accompanied by low-grade enhancement in the same structures proportionate the to the degree of edema. No drainable abscess or masslike lesion is appreciated. Given the bilateral symmetry this may well represent myositis, possibly related to prior radiation therapy. However, the degree of presacral edema is increased compared to 12/27/2019 and by report the patient has prior radiation therapy was completed in December 2020. There is accentuated T1 signal in the sacrum and adjacent portion of the iliac bones as well as the pubic rami, compatible with prior radiation therapy. No mass lesion is identified along the sacral plexus, sciatic notch, or sciatic nerves. No significant asymmetry or accentuation of T2 signal or enhancement in the sciatic nerves to specifically indicate sciatic neuropathy. IMPRESSION: 1. No clearly defined mass or asymmetry in the anal/perianal region or adjacent rectum. 2. There is mild but new presacral edema along with edema tracking along and within the iliacus muscles, piriformis muscles, central gluteus muscles, and bilateral hip adductor musculature in a symmetric fashion. This is also accompanied by low-grade enhancement. This may reflect myositis, and association with prior regional radiation therapy is not excluded. 3. Extensive sigmoid colon diverticulosis, no active diverticulitis identified. 4. Common iliac atherosclerosis. Electronically Signed   By: Van Clines M.D.   On: 06/21/2020 17:57    Medications:  Scheduled: . atorvastatin  20 mg Oral Once  per day on Sun Tue Thu Sat  . heparin  5,000 Units Subcutaneous Q8H  . levothyroxine  75 mcg Oral QAC breakfast  . pantoprazole  40 mg Oral Daily  . polyethylene glycol  17 g Oral QHS  . sertraline  200 mg Oral QPM   Continuous: . 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 Her examination at the time of the initial Neurology consult was suggestive of some component of psychogenic weakness where she definitely had effort dependent weakness in both hip flexors. She did have some sensory deficits which were likely related to her post chemo/radiation and vitamin B12 deficiency. Pain is likely to be a significant confounding factor on exam and regarding her ambulatory difficulty. 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  1. Imaging results:  - MRI brain: No acute abnormality. Atrophy and mild to moderate chronic microvascular ischemic change in the white matter and pons. Chronic microhemorrhage right posterior frontal white matter.  - MRI cervical and thoracic spine 6/24 non-contrast and 7/1 contrast studies: No abnormal cervical cord signal. Mild cervical degenerative changes without high-grade stenosis. Thoracic and lumbar disc degenerative changes. No significant abnormal enhancement in the thoracolumbar spine.  - On Neurohospitalist review of the images, there appears to be subtle anterior  2. Summary of spine and brain MRI results: No definite  leptomeningeal metastases seen. LP will still be needed to rule out malignant cells.  3. MRI of pelvis with and without contrast: No clearly defined mass or asymmetry in the anal/perianal region or adjacent rectum. There is mild but new presacral edema along with edema tracking along and within the iliacus muscles, piriformis muscles, central gluteus muscles, and bilateral hip adductor musculature in a symmetric fashion. This is also accompanied by low-grade enhancement. This may reflect myositis, and association with prior regional radiation therapy is not excluded. 4. On exam today, the patient is awake, alert, and fully oriented. On BLE exam, she is unable to raise anti-gravity, but is able to push herself up in bed. Does c/o generalized weakness and BLE pain.   Recommendations: 1. LP under fluoro most likely can be cancelled, given the MRI pelvis findings, which reveal a structural etiology for the patient's BLE weakness: Bilateral extensive muscle edema consistent with myositis versus effects of prior regional radiotherapy.  2. Subcutaneous B12 injections, 1000 mcg qd x 7 days (ordered), followed by 2000 mcg po qd indefinitely both as an inpatient and an outpatient (oral dosing for 7 days hence has not been ordered - this should be ordered by the Hospitalist team prior to discharge).   3. May need a biopsy of one of the inflamed muscles seen on MRI pelvis to assess for the underlying etiology: myositis versus radiation necrosis 4. Track CK levels (ordered).  5. May benefit from IV pulsed dose steroids if biopsy reveals an inflammatory myositis.     LOS: 3 days   @Electronically  signed: Dr. Kerney Elbe 06/22/2020  7:51 AM

## 2020-06-22 NOTE — Progress Notes (Addendum)
HEMATOLOGY-ONCOLOGY PROGRESS NOTE  SUBJECTIVE: Reports ongoing leg pain.  Still having some weakness but reports that she is able to ambulate with her walker.  Has had multiple imaging studies without definitive answer for the cause of her lower extremity weakness.  MRI of pelvis with and without contrast does show some presacral edema along and within the iliacus muscles, piriform muscles, central gluteus muscles, and bilateral hip adductor musculature which may reflect myositis and and association with prior regional radiation therapy is not excluded.  Patient reports that she wants to go home.  Oncology History  Anal cancer ([place])  09/15/2019 Initial Diagnosis   Anal cancer ([place])   10/03/2019 -  Chemotherapy   The patient had mitoMYcin (MUTAMYCIN) chemo injection 18 mg, 8 mg/m2 = 18 mg (100 % of original dose 8 mg/m2), Intravenous,  Once, 1 of 1 cycle Dose modification: 8 mg/m2 (original dose 8 mg/m2, Cycle 1, Reason: Provider Judgment) Administration: 18 mg (10/03/2019) fluorouracil (ADRUCIL) 8,900 mg in sodium chloride 0.9 % 72 mL chemo infusion, 1,000 mg/m2/day = 8,900 mg, Intravenous, 4D (96 hours ), 1 of 1 cycle Dose modification: 300 mg/m2/day (original dose 1,000 mg/m2/day, Cycle 1, Reason: Provider Judgment) Administration: 8,900 mg (10/03/2019), 2,650 mg (11/07/2019)  for chemotherapy treatment.     PHYSICAL EXAMINATION:  Vitals:   06/22/20 0646 06/22/20 1026  BP:  (!) 148/87  Pulse:  (!) 102  Resp: 16 18  Temp:  98.2 F (36.8 C)  SpO2:  95%   Filed Weights   06/21/20 0616 06/21/20 2316 06/22/20 0500  Weight: 99.5 kg 99.4 kg 99.1 kg    Intake/Output from previous day: 07/01 0701 - 07/02 0700 In: 150 [P.O.:50; I.V.:100] Out: -   GENERAL:alert, no distress and comfortable LUNGS: clear to auscultation and percussion with normal breathing effort HEART: regular rate & rhythm and no murmurs and no lower extremity edema ABDOMEN:abdomen soft, non-tender and normal bowel  sounds Musculoskeletal: Decreased strength with hip flexion bilaterally 4/5 strength with dorsiflexion of the feet NEURO: alert & oriented x 3 with fluent speech, no focal motor/sensory deficits  LABORATORY DATA:  I have reviewed the data as listed CMP Latest Ref Rng & Units 06/22/2020 06/20/2020 06/19/2020  Glucose 70 - 99 mg/dL 115(H) 101(H) 104(H)  BUN 8 - 23 mg/dL 30(H) 20 17  Creatinine 0.44 - 1.00 mg/dL 1.13(H) 0.89 0.85  Sodium 135 - 145 mmol/L 138 140 140  Potassium 3.5 - 5.1 mmol/L 3.9 3.9 4.8  Chloride 98 - 111 mmol/L 103 102 104  CO2 22 - 32 mmol/L 27 29 30   Calcium 8.9 - 10.3 mg/dL 8.9 9.1 9.6  Total Protein 6.5 - 8.1 g/dL 6.0(L) - -  Total Bilirubin 0.3 - 1.2 mg/dL 1.7(H) - -  Alkaline Phos 38 - 126 U/L 72 - -  AST 15 - 41 U/L 18 - -  ALT 0 - 44 U/L 13 - -    Lab Results  Component Value Date   WBC 4.8 06/22/2020   HGB 12.1 06/22/2020   HCT 36.1 06/22/2020   MCV 97.0 06/22/2020   PLT 112 (L) 06/22/2020   NEUTROABS 3.6 06/19/2020    DG Ankle Complete Right  Result Date: 05/26/2020 CLINICAL DATA:  BILATERAL lower extremity weakness and pain, frequent falls EXAM: RIGHT ANKLE - COMPLETE 3+ VIEW COMPARISON:  None FINDINGS: Osseous demineralization. Ankle joint space preserved. Diffuse soft tissue swelling greatest laterally. Minimally displaced oblique fracture of the lateral malleolus. No additional fracture, dislocation, or bone destruction. IMPRESSION: Minimally displaced  oblique fracture of the lateral malleolus with overlying soft tissue swelling. Electronically Signed   By: Lavonia Dana M.D.   On: 05/26/2020 20:14   CT Lumbar Spine Wo Contrast  Result Date: 05/26/2020 CLINICAL DATA:  Initial evaluation lower back pain for 1 month, with bilateral lower extremity weakness, frequent falls. EXAM: CT LUMBAR SPINE WITHOUT CONTRAST TECHNIQUE: Multidetector CT imaging of the lumbar spine was performed without intravenous contrast administration. Multiplanar CT image  reconstructions were also generated. COMPARISON:  Prior CT from 09/14/2019. FINDINGS: Segmentation: Standard. Lowest well-formed disc space labeled the L5-S1 level. Alignment: Mild levoscoliosis with apex at T12-L1.  No listhesis. Vertebrae: Vertebral body height maintained without evidence for acute or chronic fracture. Visualized sacrum and pelvis intact. No discrete or worrisome osseous lesions. Paraspinal and other soft tissues: Chronic fatty atrophy noted within the posterior paraspinous soft tissues. There is suggestion of soft tissue density/stranding involving the presacral space anterior to the sacrum (series 5, image 139), incompletely visualized. Findings are nonspecific, and could reflect post radiation changes related to prior uterine cancer. Paraspinous soft tissues demonstrate no other acute finding. Moderate aorto bi-iliac atherosclerotic disease without aneurysm. Cholecystectomy clips noted. Disc levels: T11-12: Degenerative intervertebral disc space narrowing with disc desiccation and mild disc bulge. No significant stenosis. T12-L1: Degenerative intervertebral disc space narrowing with disc desiccation and mild disc bulge. Mild facet hypertrophy. No significant stenosis. L1-2: Mild diffuse disc bulge with disc desiccation. Mild-to-moderate facet hypertrophy. No significant stenosis. L2-3: Mild disc bulge. Mild to moderate facet hypertrophy. No significant spinal stenosis. Foramina remain patent. L3-4: Chronic intervertebral disc space narrowing with diffuse disc bulge. Moderate bilateral facet hypertrophy. No spinal stenosis. Mild bilateral L3 foraminal narrowing. L4-5: Chronic intervertebral disc space narrowing with diffuse disc bulge and disc desiccation. Moderate bilateral facet hypertrophy. Resultant mild canal with bilateral lateral recess stenosis. Mild right with moderate left L4 foraminal narrowing. L5-S1: Chronic intervertebral disc space narrowing with diffuse disc bulge. Mild reactive  endplate changes. Moderate left worse than right facet hypertrophy. No significant spinal stenosis. Moderate bilateral L5 foraminal narrowing, greater on the left. IMPRESSION: 1. No acute abnormality within the lumbar spine. 2. Multilevel degenerative spondylosis with resultant mild canal with bilateral lateral recess stenosis at L4-5. 3. Moderate bilateral L4 and L5 foraminal stenosis related to disc bulge, reactive endplate changes, and facet degeneration. 4. Soft tissue density/stranding involving the presacral space anterior to the sacrum, incompletely visualized. Findings are nonspecific, and could reflect post radiation changes related to prior malignancy. Correlation with history recommended. If there is no history of prior radiation treatment, further assessment with dedicated CT of the abdomen and pelvis could be performed for further characterization as warranted. 5. Aortic Atherosclerosis (ICD10-I70.0). Electronically Signed   By: Jeannine Boga M.D.   On: 05/26/2020 23:33   MR BRAIN W WO CONTRAST  Result Date: 06/21/2020 CLINICAL DATA:  Subacute neuro deficit EXAM: MRI HEAD WITHOUT AND WITH CONTRAST TECHNIQUE: Multiplanar, multiecho pulse sequences of the brain and surrounding structures were obtained without and with intravenous contrast. CONTRAST:  9.95mL GADAVIST GADOBUTROL 1 MMOL/ML IV SOLN COMPARISON:  None. FINDINGS: Brain: Mild atrophy. Negative for hydrocephalus. Patchy white matter hyperintensity bilaterally. Patchy hyperintensity in the pons bilaterally. Negative for acute infarct or mass. Chronic microhemorrhage right frontal white matter. No enhancing lesion identified postcontrast. Vascular: Normal arterial flow voids. Skull and upper cervical spine: No focal skeletal lesion. Sinuses/Orbits: Mild mucosal edema paranasal sinuses. Negative orbit. Other: None IMPRESSION: No acute abnormality Atrophy and mild to moderate chronic microvascular ischemic  change in the white matter and pons.  Chronic microhemorrhage right posterior frontal white matter. Electronically Signed   By: Franchot Gallo M.D.   On: 06/21/2020 15:26   MR THORACIC SPINE WO CONTRAST  Result Date: 06/14/2020 CLINICAL DATA:  Progressive lower extremity weakness. Back pain. History of anal cancer and uterine cancer MRI performed with assistance of anesthesiology for claustrophobia. EXAM: MRI THORACIC AND LUMBAR SPINE WITHOUT CONTRAST TECHNIQUE: Multiplanar and multiecho pulse sequences of the thoracic and lumbar spine were obtained without intravenous contrast. COMPARISON:  CT lumbar spine 05/26/2020 FINDINGS: MRI THORACIC SPINE FINDINGS Alignment:  Normal.  Mild thoracic kyphosis. Vertebrae: Negative for fracture or mass.  No bone marrow edema. Cord:  Normal signal and morphology.  No cord compression. Paraspinal and other soft tissues: Common bile duct 11.5 mm. Intrahepatic ductal dilatation. This is likely due to postop cholecystectomy. Correlate with liver function test. Disc levels: Small right-sided disc protrusion T11-12 without significant spinal or foraminal stenosis. Disc degeneration with endplate spurring at P50-D3 without stenosis. Otherwise no significant degenerative change in the thoracic spine. MRI LUMBAR SPINE FINDINGS Segmentation:  Normal Alignment:  Normal Vertebrae:  Negative for fracture or mass.  No bone marrow edema. Fatty changes in the bone marrow at L5 and the sacrum compatible with prior pelvic radiation. Conus medullaris and cauda equina: Conus extends to the T12-L1 level. Conus and cauda equina appear normal. Paraspinal and other soft tissues: Mild biliary dilatation as described above. No paraspinous mass or adenopathy. Disc levels: L1-2: Disc bulging and mild endplate spurring. No significant stenosis. L2-3: Mild disc bulging and mild facet degeneration. Negative for stenosis L3-4: Diffuse bulging of the disc with shallow central disc protrusion. Mild facet hypertrophy. Negative for spinal or  foraminal stenosis. L4-5: Disc degeneration with disc bulging and mild spurring. Mild facet degeneration. Mild to moderate left subarticular stenosis. Spinal canal mildly narrowed without significant stenosis. L5-S1: Mild disc degeneration and disc bulging. Negative for neural impingement or stenosis. Mild facet degeneration bilaterally. IMPRESSION: MR THORACIC SPINE IMPRESSION Disc degeneration at T11-T12 and T12-L1. Small right-sided disc protrusion at T11-12. No significant stenosis. Negative for lumbar fracture. Biliary dilatation likely related to cholecystectomy. Correlate with liver function test. MR LUMBAR SPINE IMPRESSION Multilevel mild degenerative changes in the lumbar spine similar to the recent CT. Mild to moderate subarticular stenosis on the left at L4-5. Negative for fracture or mass. Electronically Signed   By: Franchot Gallo M.D.   On: 06/14/2020 18:27   MR THORACIC SPINE W CONTRAST  Result Date: 06/21/2020 CLINICAL DATA:  Anal cancer post radiation and chemotherapy, lower extremity weakness, numbness and tingling with progressive worsening; multiple falls since prior noncontrast study EXAM: MRI CERVICAL WITHOUT AND WITH CONTRAST THORACIC AND LUMBAR SPINE WITH CONTRAST TECHNIQUE: Multiplanar and multiecho pulse sequences of the cervical spine, to include the craniocervical junction and cervicothoracic junction, and thoracic and lumbar spine, were obtained without and with intravenous contrast. Multiplanar multi-echo sequences of the thoracic and lumbar spine were obtained with intravenous contrast. CONTRAST:  9.9 mL Gadavist COMPARISON:  Noncontrast studies 06/14/2020, 03/08/2019 FINDINGS: MRI CERVICAL SPINE Alignment: Stable. Vertebrae: Stable vertebral body heights. No new marrow edema suspicious osseous lesion. Cord: No abnormal signal.  No abnormal intrathecal enhancement. Posterior Fossa, vertebral arteries, paraspinal tissues: Unremarkable. Disc levels: Degenerative changes primarily from  C4-C5 to C6-C7 are stable in appearance including small left central disc protrusion at C5-C6. There is stable left greater than right foraminal stenosis at this level. MRI THORACIC SPINE Alignment:  Stable Vertebrae:  Stable vertebral body heights. There is minor degenerative marrow enhancement. No suspicious osseous lesion. Cord:  No abnormal intrathecal enhancement Paraspinal and other soft tissues: Unremarkable. Disc levels: Multilevel degenerative changes better evaluated on prior study. MRI LUMBAR SPINE Alignment:  Stable. Vertebrae: Stable vertebral body heights. Minor degenerative marrow enhancement. No suspicious osseous lesion. Conus medullaris and cauda equina: No abnormal intrathecal enhancement. Conus and cauda equina appear normal. Paraspinal and other soft tissues: Unremarkable. Disc levels: Multilevel degenerative changes better evaluated prior study. IMPRESSION: No abnormal cervical cord signal. Mild cervical degenerative changes without high-grade stenosis. No significant abnormal enhancement in the thoracolumbar spine. Electronically Signed   By: Macy Mis M.D.   On: 06/21/2020 15:24   MR LUMBAR SPINE WO CONTRAST  Result Date: 06/14/2020 CLINICAL DATA:  Progressive lower extremity weakness. Back pain. History of anal cancer and uterine cancer MRI performed with assistance of anesthesiology for claustrophobia. EXAM: MRI THORACIC AND LUMBAR SPINE WITHOUT CONTRAST TECHNIQUE: Multiplanar and multiecho pulse sequences of the thoracic and lumbar spine were obtained without intravenous contrast. COMPARISON:  CT lumbar spine 05/26/2020 FINDINGS: MRI THORACIC SPINE FINDINGS Alignment:  Normal.  Mild thoracic kyphosis. Vertebrae: Negative for fracture or mass.  No bone marrow edema. Cord:  Normal signal and morphology.  No cord compression. Paraspinal and other soft tissues: Common bile duct 11.5 mm. Intrahepatic ductal dilatation. This is likely due to postop cholecystectomy. Correlate with liver  function test. Disc levels: Small right-sided disc protrusion T11-12 without significant spinal or foraminal stenosis. Disc degeneration with endplate spurring at H41-P3 without stenosis. Otherwise no significant degenerative change in the thoracic spine. MRI LUMBAR SPINE FINDINGS Segmentation:  Normal Alignment:  Normal Vertebrae:  Negative for fracture or mass.  No bone marrow edema. Fatty changes in the bone marrow at L5 and the sacrum compatible with prior pelvic radiation. Conus medullaris and cauda equina: Conus extends to the T12-L1 level. Conus and cauda equina appear normal. Paraspinal and other soft tissues: Mild biliary dilatation as described above. No paraspinous mass or adenopathy. Disc levels: L1-2: Disc bulging and mild endplate spurring. No significant stenosis. L2-3: Mild disc bulging and mild facet degeneration. Negative for stenosis L3-4: Diffuse bulging of the disc with shallow central disc protrusion. Mild facet hypertrophy. Negative for spinal or foraminal stenosis. L4-5: Disc degeneration with disc bulging and mild spurring. Mild facet degeneration. Mild to moderate left subarticular stenosis. Spinal canal mildly narrowed without significant stenosis. L5-S1: Mild disc degeneration and disc bulging. Negative for neural impingement or stenosis. Mild facet degeneration bilaterally. IMPRESSION: MR THORACIC SPINE IMPRESSION Disc degeneration at T11-T12 and T12-L1. Small right-sided disc protrusion at T11-12. No significant stenosis. Negative for lumbar fracture. Biliary dilatation likely related to cholecystectomy. Correlate with liver function test. MR LUMBAR SPINE IMPRESSION Multilevel mild degenerative changes in the lumbar spine similar to the recent CT. Mild to moderate subarticular stenosis on the left at L4-5. Negative for fracture or mass. Electronically Signed   By: Franchot Gallo M.D.   On: 06/14/2020 18:27   MR LUMBAR SPINE W CONTRAST  Result Date: 06/21/2020 CLINICAL DATA:  Anal  cancer post radiation and chemotherapy, lower extremity weakness, numbness and tingling with progressive worsening; multiple falls since prior noncontrast study EXAM: MRI CERVICAL WITHOUT AND WITH CONTRAST THORACIC AND LUMBAR SPINE WITH CONTRAST TECHNIQUE: Multiplanar and multiecho pulse sequences of the cervical spine, to include the craniocervical junction and cervicothoracic junction, and thoracic and lumbar spine, were obtained without and with intravenous contrast. Multiplanar multi-echo sequences of the thoracic and lumbar  spine were obtained with intravenous contrast. CONTRAST:  9.9 mL Gadavist COMPARISON:  Noncontrast studies 06/14/2020, 03/08/2019 FINDINGS: MRI CERVICAL SPINE Alignment: Stable. Vertebrae: Stable vertebral body heights. No new marrow edema suspicious osseous lesion. Cord: No abnormal signal.  No abnormal intrathecal enhancement. Posterior Fossa, vertebral arteries, paraspinal tissues: Unremarkable. Disc levels: Degenerative changes primarily from C4-C5 to C6-C7 are stable in appearance including small left central disc protrusion at C5-C6. There is stable left greater than right foraminal stenosis at this level. MRI THORACIC SPINE Alignment:  Stable Vertebrae: Stable vertebral body heights. There is minor degenerative marrow enhancement. No suspicious osseous lesion. Cord:  No abnormal intrathecal enhancement Paraspinal and other soft tissues: Unremarkable. Disc levels: Multilevel degenerative changes better evaluated on prior study. MRI LUMBAR SPINE Alignment:  Stable. Vertebrae: Stable vertebral body heights. Minor degenerative marrow enhancement. No suspicious osseous lesion. Conus medullaris and cauda equina: No abnormal intrathecal enhancement. Conus and cauda equina appear normal. Paraspinal and other soft tissues: Unremarkable. Disc levels: Multilevel degenerative changes better evaluated prior study. IMPRESSION: No abnormal cervical cord signal. Mild cervical degenerative changes  without high-grade stenosis. No significant abnormal enhancement in the thoracolumbar spine. Electronically Signed   By: Macy Mis M.D.   On: 06/21/2020 15:24   MR CERVICAL SPINE W WO CONTRAST  Result Date: 06/21/2020 CLINICAL DATA:  Anal cancer post radiation and chemotherapy, lower extremity weakness, numbness and tingling with progressive worsening; multiple falls since prior noncontrast study EXAM: MRI CERVICAL WITHOUT AND WITH CONTRAST THORACIC AND LUMBAR SPINE WITH CONTRAST TECHNIQUE: Multiplanar and multiecho pulse sequences of the cervical spine, to include the craniocervical junction and cervicothoracic junction, and thoracic and lumbar spine, were obtained without and with intravenous contrast. Multiplanar multi-echo sequences of the thoracic and lumbar spine were obtained with intravenous contrast. CONTRAST:  9.9 mL Gadavist COMPARISON:  Noncontrast studies 06/14/2020, 03/08/2019 FINDINGS: MRI CERVICAL SPINE Alignment: Stable. Vertebrae: Stable vertebral body heights. No new marrow edema suspicious osseous lesion. Cord: No abnormal signal.  No abnormal intrathecal enhancement. Posterior Fossa, vertebral arteries, paraspinal tissues: Unremarkable. Disc levels: Degenerative changes primarily from C4-C5 to C6-C7 are stable in appearance including small left central disc protrusion at C5-C6. There is stable left greater than right foraminal stenosis at this level. MRI THORACIC SPINE Alignment:  Stable Vertebrae: Stable vertebral body heights. There is minor degenerative marrow enhancement. No suspicious osseous lesion. Cord:  No abnormal intrathecal enhancement Paraspinal and other soft tissues: Unremarkable. Disc levels: Multilevel degenerative changes better evaluated on prior study. MRI LUMBAR SPINE Alignment:  Stable. Vertebrae: Stable vertebral body heights. Minor degenerative marrow enhancement. No suspicious osseous lesion. Conus medullaris and cauda equina: No abnormal intrathecal enhancement.  Conus and cauda equina appear normal. Paraspinal and other soft tissues: Unremarkable. Disc levels: Multilevel degenerative changes better evaluated prior study. IMPRESSION: No abnormal cervical cord signal. Mild cervical degenerative changes without high-grade stenosis. No significant abnormal enhancement in the thoracolumbar spine. Electronically Signed   By: Macy Mis M.D.   On: 06/21/2020 15:24   MR PELVIS W WO CONTRAST  Result Date: 06/21/2020 CLINICAL DATA:  Anal cancer with prior radiation therapy and chemotherapy common a numbness and tingling in the lower extremities with weakness. EXAM: MRI PELVIS WITHOUT AND WITH CONTRAST TECHNIQUE: Multiplanar multisequence MR imaging of the pelvis was performed both before and after administration of intravenous contrast. CONTRAST:  9.79mL GADAVIST GADOBUTROL 1 MMOL/ML IV SOLN COMPARISON:  CT pelvis 12/27/2019 FINDINGS: Urinary Tract:  Unremarkable Bowel: Extensive sigmoid colon diverticulosis, no active diverticulitis identified. No clearly defined  mass or asymmetry in the anal/perianal region or in the adjacent rectum. Please note that today's exam was not performed using the rectal protocol. Vascular/Lymphatic: Common iliac atherosclerosis. No pathologic adenopathy identified. Reproductive:  Uterus absent.  Adnexa unremarkable. Other:  No supplemental non-categorized findings. Musculoskeletal: There is mild presacral edema along with edema tracking along and within the iliacus muscles, piriformis muscles, central gluteus muscles, obturator internus muscles, and bilateral hip adductor musculature in a symmetric fashion. This edema is also accompanied by low-grade enhancement in the same structures proportionate the to the degree of edema. No drainable abscess or masslike lesion is appreciated. Given the bilateral symmetry this may well represent myositis, possibly related to prior radiation therapy. However, the degree of presacral edema is increased compared  to 12/27/2019 and by report the patient has prior radiation therapy was completed in December 2020. There is accentuated T1 signal in the sacrum and adjacent portion of the iliac bones as well as the pubic rami, compatible with prior radiation therapy. No mass lesion is identified along the sacral plexus, sciatic notch, or sciatic nerves. No significant asymmetry or accentuation of T2 signal or enhancement in the sciatic nerves to specifically indicate sciatic neuropathy. IMPRESSION: 1. No clearly defined mass or asymmetry in the anal/perianal region or adjacent rectum. 2. There is mild but new presacral edema along with edema tracking along and within the iliacus muscles, piriformis muscles, central gluteus muscles, and bilateral hip adductor musculature in a symmetric fashion. This is also accompanied by low-grade enhancement. This may reflect myositis, and association with prior regional radiation therapy is not excluded. 3. Extensive sigmoid colon diverticulosis, no active diverticulitis identified. 4. Common iliac atherosclerosis. Electronically Signed   By: Van Clines M.D.   On: 06/21/2020 17:57   DG Knee Complete 4 Views Left  Result Date: 05/26/2020 CLINICAL DATA:  BILATERAL lower extremity weakness and pain, multiple falls EXAM: LEFT KNEE - COMPLETE 4+ VIEW COMPARISON:  10/16/2019 FINDINGS: Osseous demineralization. Joint spaces preserved. No acute fracture, dislocation, or bone destruction. No joint effusion. IMPRESSION: No acute osseous abnormalities. Electronically Signed   By: Lavonia Dana M.D.   On: 05/26/2020 20:15   DG Knee Complete 4 Views Right  Result Date: 05/26/2020 CLINICAL DATA:  Pain and weakness in BILATERAL lower extremities, frequent falls EXAM: RIGHT KNEE - COMPLETE 4+ VIEW COMPARISON:  None FINDINGS: Mild osseous demineralization. Joint space narrowing diffusely. Tiny cortical avulsion fracture identified at the medial margin of the medial femoral condyle superiorly. No  additional fracture, dislocation, or bone destruction. IMPRESSION: Small cortical avulsion fracture identified at medial margin of the medial femoral condyle, potentially at medial collateral ligament origin. No additional acute osseous abnormalities. Electronically Signed   By: Lavonia Dana M.D.   On: 05/26/2020 20:13    ASSESSMENT AND PLAN: 1. 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 Biopsy 08/30/2019 confirmed well-differentiated squamous cell carcinoma with positive P 16 and p63 stains ? CTs 09/14/2019-abnormal soft tissue fullness at the lower anus/perianal soft tissues, asymmetric left inguinal lymphadenopathy, borderline enlarged left external iliac node, mild stranding and cutaneous thickening of the left gluteal fold, 2 to 3 mm pulmonary nodules, 1.6 cm right hepatic lesion-likely a hemangioma ? PET scan 54/05/5680-EXNTZG hypermetabolic anal mass. 3 hypermetabolic left inguinal lymph nodes. Left external iliac node with minimally higher than blood pool activity. 1.0 x 0.8 cm left thyroid nodule with activity mildly above background blood pool activity but below liver activity. No perceptible hypermetabolic activity in the vicinity of the lateral right  hepatic lobe lesion seen on the 09/14/2019 CT. ? Began concurrent chemoRT with 5FU/mitomycin on 10/03/2019 ? Cycle25-FU, dose reduced, mitomycin held 11/07/2019 ? Radiation completed 11/24/2019 ? CT abdomen/pelvis 12/27/2019-extensive left-sided colonic diverticulosis without diverticulitis. No bowel obstruction. No abscess in the abdomen or pelvis. Rectum borderline distended with stool without perirectal wall thickening or soft tissue stranding. Left inguinal and external iliac lymph nodes now subcentimeter compared to the previous size. Stable small lesion in the right lobe of the liver. No new liver lesions evident. 2. COPD 3. Fibromyalgia 4. Chronic back pain 5. Hypothyroid 6. Depression 7. 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 Pain secondary to #1 9. Orthostatic hypotension, fatigue, dyspnea on exertion, early mucositis requiring supportive care on day 9, secondary to chemotherapy  10. Thrombocytopenia PLT 58K and neutropenia ANC 1.4 on day 9, secondary to chemotherapy 11. Worsening cytopenias, PLT 8K and ANC 0.0 on day 15, secondary to chemotherapy. Started prophylaxis with cipro on 10/22 day 11 12. Hospital admission 10/17/2019 -fatigue, pancytopenia, hypokalemia 13.Left thyroid nodule with activity mildly above background blood pool activity butbelowliver activity on PET scan 09/27/2019.Thyroid ultrasound219 2021-1.6 cm nodule left inferior thyroid gland. 06/26/5464 FNA-benign follicular nodule. 14.Tiny lung nodules noted on chest CT 09/14/2019 15.  Falls, back/leg pain and leg weakness-CT lumbar spine 05/26/2020 with no acute abnormality within the lumbar spine; multilevel degenerative spondylosis with resultant mild canal with bilateral lateral recess stenosis at L4-5; moderate bilateral L4 and L5 foraminal stenosis related to disc bulge, reactive endplate changes and facet degeneration; soft tissue density/stranding involving the presacral space anterior to the sacrum, incompletely visualized  MRI thoracic and lumbar spine 06/14/2020-no mass or fracture, degenerative changes in the thoracic and lumbar spine  MRI brain 06/21/2020-no acute abnormality  MRI cervical, thoracic and lumbar spine 06/21/2020-no abnormal cervical cord signal, mild cervical degenerative changes with high-grade stenosis, no significant abnormal enhancement in the thoracolumbar spine.  MRI pelvis 06/21/2020-no clearly defined mass or asymmetry in the anal/perianal region or adjacent rectum, mild but new presacral edema within the iliacus muscles, piriformis muscles, central gluteus muscles, and bilateral hip adductor musculature in a symmetric fashion. This is also accompanied by low-grade enhancement. This may reflect myositis, and  association with prior regional radiation therapy is not excluded.  Ms. Perritt appears stable.  Has ongoing lower extremity weakness and discomfort of unclear etiology.  Neurology following and indicates weakness may be caused from myositis versus effects of prior regional radiotherapy.  They have recommended vitamin B12 injections followed by oral vitamin B12.  LP under fluoroscopy has been canceled.  Neurology thinks she could need a biopsy of one of her inflamed muscles at some point.  Recommendations: 1.  Await final recommendations from neurology. 2.  Recommend PT/OT consultation for weakness.    LOS: 3 days   Mikey Bussing, DNP, AGPCNP-BC, AOCNP 06/22/20 Ms. Cercone was interviewed and examined.  The clinical evaluation to date has not revealed evidence of recurrent anal cancer.  She continues to have bilateral leg pain and weakness.  The pain and weakness could be related to inflammatory changes in the pelvis noted on the pelvic MRI.  I have a low clinical suspicion for a primary "myositis ".  I suspect the imaging findings are related to radiation.  I discussed the case with Dr. Lisbeth Renshaw.  He feels the imaging findings could be secondary to radiation, but the clinical symptoms are not typical.  He feels radiation necrosis is very unlikely, but radiation induced myositis is possible.  He recommends a  trial of prednisone.  We do not recommend a biopsy as this is very unlikely to change her treatment.  I recommend beginning prednisone at a dose of 60 mg daily to be tapered slowly over the next 1-2 months.  I will check on her over the weekend.  She may require skilled nursing facility placement for physical therapy.  Julieanne Manson, MD

## 2020-06-22 NOTE — Evaluation (Signed)
Physical Therapy Evaluation Patient Details Name: Joanne Klein MRN: [id number] DOB: 19-Aug-1946 Today's Date: 06/22/2020   History of Present Illness  Pt is a 74 y/o female admitted secondary progressively worsening bilateral LE weakness. Neurology following and indicates weakness may be caused from myositis versus effects of prior regional radiotherapy. PMH including but not limited to  anal cancer status post radiation and chemotherapy completed on 11/2019, COPD, hypothyroidism, depression.    Clinical Impression  Pt presented supine in bed with HOB elevated, awake and willing to participate in therapy session. Prior to admission, pt reported that she ambulates with use of a cane and was independent with ADLs. Pt lives alone in a single level home with a level entry. Pt's daughter will be staying with her for approximately one week upon d/c. At the time of evaluation, pt able to perform bed mobility at a mod I level, transfers with supervision and ambulated a short distance in her room with RW and min guard for safety. Pt with one instance of minor LOB with turning and required min A to recover. Pt would continue to benefit from skilled physical therapy services at this time while admitted and after d/c to address the below listed limitations in order to improve overall safety and independence with functional mobility.     Follow Up Recommendations Home health PT    Equipment Recommendations  None recommended by PT    Recommendations for Other Services       Precautions / Restrictions Precautions Precautions: Fall Restrictions Weight Bearing Restrictions: No      Mobility  Bed Mobility Overal bed mobility: Modified Independent                Transfers Overall transfer level: Needs assistance Equipment used: Rolling walker (2 wheeled) Transfers: Sit to/from Stand Sit to Stand: Supervision         General transfer comment: good technique  utilized  Ambulation/Gait Ambulation/Gait assistance: Min guard;Min Web designer (Feet): 40 Feet Assistive device: Rolling walker (2 wheeled) Gait Pattern/deviations: Decreased stride length;Step-to pattern;Step-through pattern Gait velocity: decreased   General Gait Details: pt with mild instability with use of RW; pt with one minor LOB with turning requiring min A to recover  Stairs            Wheelchair Mobility    Modified Rankin (Stroke Patients Only)       Balance Overall balance assessment: Needs assistance Sitting-balance support: Feet supported Sitting balance-Leahy Scale: Good     Standing balance support: During functional activity;Bilateral upper extremity supported Standing balance-Leahy Scale: Poor                               Pertinent Vitals/Pain Pain Assessment: Faces Pain Score: 2  Faces Pain Scale: Hurts little more Pain Location: bilateral LEs Pain Descriptors / Indicators: Guarding Pain Intervention(s): Monitored during session;Repositioned    Home Living Family/patient expects to be discharged to:: Private residence Living Arrangements: Alone Available Help at Discharge: Family;Available 24 hours/day;Other (Comment);Neighbor (daughter is staying with pt for the next week) Type of Home: House Home Access: [place]: One Ada: Clinical cytogeneticist - 4 wheels;Cane - single point      Prior Function Level of Independence: Independent with assistive device(s)         Comments: uses a cane to ambulate     Hand Dominance  Extremity/Trunk Assessment   Upper Extremity Assessment Upper Extremity Assessment: Overall WFL for tasks assessed    Lower Extremity Assessment Lower Extremity Assessment: Generalized weakness       Communication   Communication: No difficulties  Cognition Arousal/Alertness: Awake/alert Behavior During Therapy: WFL for tasks  assessed/performed Overall Cognitive Status: Within Functional Limits for tasks assessed                                        General Comments      Exercises General Exercises - Lower Extremity Ankle Circles/Pumps: AROM;Both;Seated Long Arc Quad: AROM;AAROM;Both;Seated Hip Flexion/Marching: Seated;AROM;Both   Assessment/Plan    PT Assessment Patient needs continued PT services  PT Problem List Decreased strength;Decreased range of motion;Decreased activity tolerance;Decreased balance;Decreased mobility;Decreased coordination;Decreased knowledge of use of DME;Decreased safety awareness;Decreased knowledge of precautions;Pain       PT Treatment Interventions DME instruction;Gait training;Stair training;Functional mobility training;Therapeutic activities;Therapeutic exercise;Balance training;Neuromuscular re-education;Patient/family education    PT Goals (Current goals can be found in the Care Plan section)  Acute Rehab PT Goals Patient Stated Goal: to get stronger PT Goal Formulation: With patient Time For Goal Achievement: 07/06/20 Potential to Achieve Goals: Good    Frequency Min 3X/week   Barriers to discharge        Co-evaluation               AM-PAC PT "6 Clicks" Mobility  Outcome Measure Help needed turning from your back to your side while in a flat bed without using bedrails?: None Help needed moving from lying on your back to sitting on the side of a flat bed without using bedrails?: None Help needed moving to and from a bed to a chair (including a wheelchair)?: A Little Help needed standing up from a chair using your arms (e.g., wheelchair or bedside chair)?: A Little Help needed to walk in hospital room?: A Little Help needed climbing 3-5 steps with a railing? : A Lot 6 Click Score: 19    End of Session   Activity Tolerance: Patient tolerated treatment well Patient left: in bed;with call bell/phone within reach Nurse Communication:  Mobility status PT Visit Diagnosis: Other abnormalities of gait and mobility (R26.89)    Time: 0370-4888 PT Time Calculation (min) (ACUTE ONLY): 18 min   Charges:   PT Evaluation $PT Eval Moderate Complexity: 1 Mod          Eduard Clos, PT, DPT  Acute Rehabilitation Services Pager [phone] Office [place] 06/22/2020, 4:47 PM

## 2020-06-22 NOTE — Progress Notes (Signed)
PROGRESS NOTE    Joanne Klein  ZHG:[id number] DOB: [birth year] DOA: 06/19/2020 PCP: Hoyt Koch, MD    Brief Narrative: HPI per Dr. Oretha Milch on 06/19/20 Joanne Klein a 74 y.o.femalewith medical history significant foranal cancer status postradiation and chemotherapy completed on 11/2019, COPD, hypothyroidism, depressionwho presents on 6/29/2021as a direct admission from cancer center withlower extremity weakness, numbness and tingling has been ongoing for 2 months but has progressively worsened in the past week.  She was last evaluated on 6/24 in the ED for similar complaints MRI of lumbar/thoracic spine at that time showed no acute etiologies as imaging was only consistent with chronic degenerative changes and some mild stenosis.  Since then she has had at least3 falls related to her knees locking, having numbness and tingling that starts in her feet with sharp pain behind her knees that radiates up to her thighs. She denies any headache, changes in vision, upper arm weakness, urinary or fecal incontinence, no fevers or chills, no recent sick contacts. 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 Labs were obtained at that office visit which were essentially unremarkable.  Interim history-patient continues to complain of bilateral leg upper and lower leg pain and tingling radiating from her buttocks to the feet.  She is complaining of 10 out of 10 pain and asking for stronger pain medications.  Assessment & Plan:   Active Problems:   Depression   Hypothyroidism   COPD GOLD II    Hyperlipidemia   Morbid obesity (HCC)   Numbness and tingling   Weakness of both legs   Falls frequently   Weakness   Lumbar radiculopathy   #1 bilateral lower extremity weakness numbness and tingling and unsteady gait unable to  walk-unclear etiology.  MRI of the C-spine thoracic spine lumbar spine no significant stenosis and no acute changes.  MRI of the brain chronic small vessel ischemic changes and chronic microhemorrhage in the right posterior frontal white matter MRI pelvis new presacral edema along with edema tracking along the iliacus muscle piriformis muscles central gluteus muscle and bilateral hip adductor musculature in a symmetric fraction.  This is also accompanied by low-grade enhancement.  This may reflect a myositis and association with prior regional radiation therapy is not excluded. Discussed with neurology to see if she is a candidate for steroids Discussed with interventional radiology biopsy not recommended to rule out myositis.  MRI revealed myositis.  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 Benay Spice.  #3 B12 deficiency B12 low end of normal at 133 being repleted  #4 history of depression anxiety continue home meds sertraline and Xanax  #5 hypothyroidism normal TSH continue Synthroid  #6 history of essential hypertension continue hydralazine  #7 history of hyperlipidemia on atorvastatin  #8 history of COPD stable continue nebs   #9 goals of care patient is DNR on admission    Estimated body mass index is 35.26 kg/m as calculated from the following:   Height as of an earlier encounter on 06/19/20: 5\' 6"  (1.676 m).   Weight as of this encounter: 99.1 kg.  DVT prophylaxis: Heparin Code Status: DNR Family Communication: None at bedside attempted to call the daughter with no response Disposition Plan:  Status is: Inpatient  Dispo: The patient is from: home  Anticipated d/c is [id number]              Anticipated d/c date is: Unknown              Patient currently is not medically stable to d/c.  Patient admitted with bilateral lower extremity weakness new onset undergoing work-up followed by neurology    Consultants:  Oncology and neurology  Procedures: None Antimicrobials: None  Subjective: She is crying out of pain 10 out of 10 asking for pain medications  Objective: Vitals:   06/22/20 0600 06/22/20 0605 06/22/20 0646 06/22/20 1026  BP: 133/75   (!) 148/87  Pulse: 99   (!) 102  Resp: 17 16 16 18   Temp: 98.9 F (37.2 C)   98.2 F (36.8 C)  TempSrc: Oral     SpO2: 94%   95%  Weight:        Intake/Output Summary (Last 24 hours) at 06/22/2020 1227 Last data filed at 06/22/2020 0300 Gross per 24 hour  Intake 150 ml  Output --  Net 150 ml   Filed Weights   06/21/20 0616 06/21/20 2316 06/22/20 0500  Weight: 99.5 kg 99.4 kg 99.1 kg    Examination:  General exam: Appears calm and comfortable  Respiratory system: Clear to auscultation. Respiratory effort normal. Cardiovascular system: S1 & S2 heard, RRR. No JVD, murmurs, rubs, gallops or clicks. No pedal edema. Gastrointestinal system: Abdomen is nondistended, soft and nontender. No organomegaly or masses felt. Normal bowel sounds heard. Central nervous system: Alert and oriented.  Moves upper extremities freely move the right lower extremity with some difficulty and the left lower extremity movement is much less compared to the right  extremities: No edema Skin: No rashes, lesions or ulcers Psychiatry: Judgement and insight appear normal. Mood & affect appropriate.   Data Reviewed: I have personally reviewed following labs and imaging studies  CBC: Recent Labs  Lab 06/19/20 0806 06/20/20 0559  WBC 5.1 4.3  NEUTROABS 3.6  --   HGB 13.0 12.0  HCT 38.0 35.1*  MCV 97.2 96.2  PLT 118* 712*   Basic Metabolic Panel: Recent Labs  Lab 06/19/20 0806 06/20/20 0559  NA 140 140  K 4.8 3.9  CL 104 102  CO2 30 29  GLUCOSE 104* 101*  BUN 17 20  CREATININE 0.85 0.89  CALCIUM 9.6 9.1   GFR: Estimated Creatinine Clearance: 67.8 mL/min (by C-G formula based on SCr of 0.89 mg/dL). Liver Function Tests: No results for input(s): AST, ALT,  ALKPHOS, BILITOT, PROT, ALBUMIN in the last 168 hours. No results for input(s): LIPASE, AMYLASE in the last 168 hours. No results for input(s): AMMONIA in the last 168 hours. Coagulation Profile: No results for input(s): INR, PROTIME in the last 168 hours. Cardiac Enzymes: No results for input(s): CKTOTAL, CKMB, CKMBINDEX, TROPONINI in the last 168 hours. BNP (last 3 results) No results for input(s): PROBNP in the last 8760 hours. HbA1C: No results for input(s): HGBA1C in the last 72 hours. CBG: No results for input(s): GLUCAP in the last 168 hours. Lipid Profile: No results for input(s): CHOL, HDL, LDLCALC, TRIG, CHOLHDL, LDLDIRECT in the last 72 hours. Thyroid Function Tests: Recent Labs    06/19/20 1538  TSH 0.653   Anemia Panel: Recent Labs    06/19/20 1538  VITAMINB12 133*   Sepsis Labs: No results for input(s): PROCALCITON, LATICACIDVEN in the last 168 hours.  Recent Results (from the past 240 hour(s))  SARS Coronavirus 2 by RT PCR (hospital order, performed  in [facility] lab) Nasopharyngeal Nasopharyngeal Swab     Status: None   Collection Time: 06/14/20 11:41 AM   Specimen: Nasopharyngeal Swab  Result Value Ref Range Status   SARS Coronavirus 2 NEGATIVE NEGATIVE Final    Comment: (NOTE) SARS-CoV-2 target nucleic acids are NOT DETECTED.  The SARS-CoV-2 RNA is generally detectable in upper and lower respiratory specimens during the acute phase of infection. The lowest concentration of SARS-CoV-2 viral copies this assay can detect is 250 copies / mL. A negative result does not preclude SARS-CoV-2 infection and should not be used as the sole basis for treatment or other patient management decisions.  A negative result may occur with improper specimen collection / handling, submission of specimen other than nasopharyngeal swab, presence of viral mutation(s) within the areas targeted by this assay, and inadequate number of viral copies (<250 copies / mL). A  negative result must be combined with clinical observations, patient history, and epidemiological information.  Fact Sheet for Patients:   StrictlyIdeas.no  Fact Sheet for Healthcare Providers: BankingDealers.co.za  This test is not yet approved or  cleared by the [place] FDA and has been authorized for detection and/or diagnosis of SARS-CoV-2 by FDA under an Emergency Use Authorization (EUA).  This EUA will remain in effect (meaning this test can be used) for the duration of the COVID-19 declaration under Section 564(b)(1) of the Act, 21 U.S.C. section 360bbb-3(b)(1), unless the authorization is terminated or revoked sooner.  Performed at [facility], [place] [address], [place], [place] [address]   SARS Coronavirus 2 by RT PCR (hospital order, performed in [facility] hospital lab) Nasopharyngeal Nasopharyngeal Swab     Status: None   Collection Time: 06/19/20  4:25 PM   Specimen: Nasopharyngeal Swab  Result Value Ref Range Status   SARS Coronavirus 2 NEGATIVE NEGATIVE Final    Comment: (NOTE) SARS-CoV-2 target nucleic acids are NOT DETECTED.  The SARS-CoV-2 RNA is generally detectable in upper and lower respiratory specimens during the acute phase of infection. The lowest concentration of SARS-CoV-2 viral copies this assay can detect is 250 copies / mL. A negative result does not preclude SARS-CoV-2 infection and should not be used as the sole basis for treatment or other patient management decisions.  A negative result may occur with improper specimen collection / handling, submission of specimen other than nasopharyngeal swab, presence of viral mutation(s) within the areas targeted by this assay, and inadequate number of viral copies (<250 copies / mL). A negative result must be combined with clinical observations, patient history, and epidemiological information.  Fact Sheet for Patients:     StrictlyIdeas.no  Fact Sheet for Healthcare Providers: BankingDealers.co.za  This test is not yet approved or  cleared by the [place] FDA and has been authorized for detection and/or diagnosis of SARS-CoV-2 by FDA under an Emergency Use Authorization (EUA).  This EUA will remain in effect (meaning this test can be used) for the duration of the COVID-19 declaration under Section 564(b)(1) of the Act, 21 U.S.C. section 360bbb-3(b)(1), unless the authorization is terminated or revoked sooner.  Performed at [facility], [place] [address], [place], [place] [address]          Radiology Studies: MR BRAIN W WO CONTRAST  Result Date: 06/21/2020 CLINICAL DATA:  Subacute neuro deficit EXAM: MRI HEAD WITHOUT AND WITH CONTRAST TECHNIQUE: Multiplanar, multiecho pulse sequences of the brain and surrounding structures were obtained without and with intravenous contrast. CONTRAST:  9.50mL GADAVIST  GADOBUTROL 1 MMOL/ML IV SOLN COMPARISON:  None. FINDINGS: Brain: Mild atrophy. Negative for hydrocephalus. Patchy white matter hyperintensity bilaterally. Patchy hyperintensity in the pons bilaterally. Negative for acute infarct or mass. Chronic microhemorrhage right frontal white matter. No enhancing lesion identified postcontrast. Vascular: Normal arterial flow voids. Skull and upper cervical spine: No focal skeletal lesion. Sinuses/Orbits: Mild mucosal edema paranasal sinuses. Negative orbit. Other: None IMPRESSION: No acute abnormality Atrophy and mild to moderate chronic microvascular ischemic change in the white matter and pons. Chronic microhemorrhage right posterior frontal white matter. Electronically Signed   By: Franchot Gallo M.D.   On: 06/21/2020 15:26   MR THORACIC SPINE W CONTRAST  Result Date: 06/21/2020 CLINICAL DATA:  Anal cancer post radiation and chemotherapy, lower extremity weakness, numbness and tingling with progressive  worsening; multiple falls since prior noncontrast study EXAM: MRI CERVICAL WITHOUT AND WITH CONTRAST THORACIC AND LUMBAR SPINE WITH CONTRAST TECHNIQUE: Multiplanar and multiecho pulse sequences of the cervical spine, to include the craniocervical junction and cervicothoracic junction, and thoracic and lumbar spine, were obtained without and with intravenous contrast. Multiplanar multi-echo sequences of the thoracic and lumbar spine were obtained with intravenous contrast. CONTRAST:  9.9 mL Gadavist COMPARISON:  Noncontrast studies 06/14/2020, 03/08/2019 FINDINGS: MRI CERVICAL SPINE Alignment: Stable. Vertebrae: Stable vertebral body heights. No new marrow edema suspicious osseous lesion. Cord: No abnormal signal.  No abnormal intrathecal enhancement. Posterior Fossa, vertebral arteries, paraspinal tissues: Unremarkable. Disc levels: Degenerative changes primarily from C4-C5 to C6-C7 are stable in appearance including small left central disc protrusion at C5-C6. There is stable left greater than right foraminal stenosis at this level. MRI THORACIC SPINE Alignment:  Stable Vertebrae: Stable vertebral body heights. There is minor degenerative marrow enhancement. No suspicious osseous lesion. Cord:  No abnormal intrathecal enhancement Paraspinal and other soft tissues: Unremarkable. Disc levels: Multilevel degenerative changes better evaluated on prior study. MRI LUMBAR SPINE Alignment:  Stable. Vertebrae: Stable vertebral body heights. Minor degenerative marrow enhancement. No suspicious osseous lesion. Conus medullaris and cauda equina: No abnormal intrathecal enhancement. Conus and cauda equina appear normal. Paraspinal and other soft tissues: Unremarkable. Disc levels: Multilevel degenerative changes better evaluated prior study. IMPRESSION: No abnormal cervical cord signal. Mild cervical degenerative changes without high-grade stenosis. No significant abnormal enhancement in the thoracolumbar spine. Electronically  Signed   By: Macy Mis M.D.   On: 06/21/2020 15:24   MR LUMBAR SPINE W CONTRAST  Result Date: 06/21/2020 CLINICAL DATA:  Anal cancer post radiation and chemotherapy, lower extremity weakness, numbness and tingling with progressive worsening; multiple falls since prior noncontrast study EXAM: MRI CERVICAL WITHOUT AND WITH CONTRAST THORACIC AND LUMBAR SPINE WITH CONTRAST TECHNIQUE: Multiplanar and multiecho pulse sequences of the cervical spine, to include the craniocervical junction and cervicothoracic junction, and thoracic and lumbar spine, were obtained without and with intravenous contrast. Multiplanar multi-echo sequences of the thoracic and lumbar spine were obtained with intravenous contrast. CONTRAST:  9.9 mL Gadavist COMPARISON:  Noncontrast studies 06/14/2020, 03/08/2019 FINDINGS: MRI CERVICAL SPINE Alignment: Stable. Vertebrae: Stable vertebral body heights. No new marrow edema suspicious osseous lesion. Cord: No abnormal signal.  No abnormal intrathecal enhancement. Posterior Fossa, vertebral arteries, paraspinal tissues: Unremarkable. Disc levels: Degenerative changes primarily from C4-C5 to C6-C7 are stable in appearance including small left central disc protrusion at C5-C6. There is stable left greater than right foraminal stenosis at this level. MRI THORACIC SPINE Alignment:  Stable Vertebrae: Stable vertebral body heights. There is minor degenerative marrow enhancement. No suspicious osseous lesion. Cord:  No  abnormal intrathecal enhancement Paraspinal and other soft tissues: Unremarkable. Disc levels: Multilevel degenerative changes better evaluated on prior study. MRI LUMBAR SPINE Alignment:  Stable. Vertebrae: Stable vertebral body heights. Minor degenerative marrow enhancement. No suspicious osseous lesion. Conus medullaris and cauda equina: No abnormal intrathecal enhancement. Conus and cauda equina appear normal. Paraspinal and other soft tissues: Unremarkable. Disc levels: Multilevel  degenerative changes better evaluated prior study. IMPRESSION: No abnormal cervical cord signal. Mild cervical degenerative changes without high-grade stenosis. No significant abnormal enhancement in the thoracolumbar spine. Electronically Signed   By: Macy Mis M.D.   On: 06/21/2020 15:24   MR CERVICAL SPINE W WO CONTRAST  Result Date: 06/21/2020 CLINICAL DATA:  Anal cancer post radiation and chemotherapy, lower extremity weakness, numbness and tingling with progressive worsening; multiple falls since prior noncontrast study EXAM: MRI CERVICAL WITHOUT AND WITH CONTRAST THORACIC AND LUMBAR SPINE WITH CONTRAST TECHNIQUE: Multiplanar and multiecho pulse sequences of the cervical spine, to include the craniocervical junction and cervicothoracic junction, and thoracic and lumbar spine, were obtained without and with intravenous contrast. Multiplanar multi-echo sequences of the thoracic and lumbar spine were obtained with intravenous contrast. CONTRAST:  9.9 mL Gadavist COMPARISON:  Noncontrast studies 06/14/2020, 03/08/2019 FINDINGS: MRI CERVICAL SPINE Alignment: Stable. Vertebrae: Stable vertebral body heights. No new marrow edema suspicious osseous lesion. Cord: No abnormal signal.  No abnormal intrathecal enhancement. Posterior Fossa, vertebral arteries, paraspinal tissues: Unremarkable. Disc levels: Degenerative changes primarily from C4-C5 to C6-C7 are stable in appearance including small left central disc protrusion at C5-C6. There is stable left greater than right foraminal stenosis at this level. MRI THORACIC SPINE Alignment:  Stable Vertebrae: Stable vertebral body heights. There is minor degenerative marrow enhancement. No suspicious osseous lesion. Cord:  No abnormal intrathecal enhancement Paraspinal and other soft tissues: Unremarkable. Disc levels: Multilevel degenerative changes better evaluated on prior study. MRI LUMBAR SPINE Alignment:  Stable. Vertebrae: Stable vertebral body heights. Minor  degenerative marrow enhancement. No suspicious osseous lesion. Conus medullaris and cauda equina: No abnormal intrathecal enhancement. Conus and cauda equina appear normal. Paraspinal and other soft tissues: Unremarkable. Disc levels: Multilevel degenerative changes better evaluated prior study. IMPRESSION: No abnormal cervical cord signal. Mild cervical degenerative changes without high-grade stenosis. No significant abnormal enhancement in the thoracolumbar spine. Electronically Signed   By: Macy Mis M.D.   On: 06/21/2020 15:24   MR PELVIS W WO CONTRAST  Result Date: 06/21/2020 CLINICAL DATA:  Anal cancer with prior radiation therapy and chemotherapy common a numbness and tingling in the lower extremities with weakness. EXAM: MRI PELVIS WITHOUT AND WITH CONTRAST TECHNIQUE: Multiplanar multisequence MR imaging of the pelvis was performed both before and after administration of intravenous contrast. CONTRAST:  9.26mL GADAVIST GADOBUTROL 1 MMOL/ML IV SOLN COMPARISON:  CT pelvis 12/27/2019 FINDINGS: Urinary Tract:  Unremarkable Bowel: Extensive sigmoid colon diverticulosis, no active diverticulitis identified. No clearly defined mass or asymmetry in the anal/perianal region or in the adjacent rectum. Please note that today's exam was not performed using the rectal protocol. Vascular/Lymphatic: Common iliac atherosclerosis. No pathologic adenopathy identified. Reproductive:  Uterus absent.  Adnexa unremarkable. Other:  No supplemental non-categorized findings. Musculoskeletal: There is mild presacral edema along with edema tracking along and within the iliacus muscles, piriformis muscles, central gluteus muscles, obturator internus muscles, and bilateral hip adductor musculature in a symmetric fashion. This edema is also accompanied by low-grade enhancement in the same structures proportionate the to the degree of edema. No drainable abscess or masslike lesion is appreciated. Given the bilateral  symmetry this  may well represent myositis, possibly related to prior radiation therapy. However, the degree of presacral edema is increased compared to 12/27/2019 and by report the patient has prior radiation therapy was completed in December 2020. There is accentuated T1 signal in the sacrum and adjacent portion of the iliac bones as well as the pubic rami, compatible with prior radiation therapy. No mass lesion is identified along the sacral plexus, sciatic notch, or sciatic nerves. No significant asymmetry or accentuation of T2 signal or enhancement in the sciatic nerves to specifically indicate sciatic neuropathy. IMPRESSION: 1. No clearly defined mass or asymmetry in the anal/perianal region or adjacent rectum. 2. There is mild but new presacral edema along with edema tracking along and within the iliacus muscles, piriformis muscles, central gluteus muscles, and bilateral hip adductor musculature in a symmetric fashion. This is also accompanied by low-grade enhancement. This may reflect myositis, and association with prior regional radiation therapy is not excluded. 3. Extensive sigmoid colon diverticulosis, no active diverticulitis identified. 4. Common iliac atherosclerosis. Electronically Signed   By: Van Clines M.D.   On: 06/21/2020 17:57        Scheduled Meds: . atorvastatin  20 mg Oral Once per day on Sun Tue Thu Sat  . cyanocobalamin  1,000 mcg Intramuscular Daily  . heparin  5,000 Units Subcutaneous Q8H  . levothyroxine  75 mcg Oral QAC breakfast  . pantoprazole  40 mg Oral Daily  . polyethylene glycol  17 g Oral QHS  . sertraline  200 mg Oral QPM   Continuous Infusions: . lactated ringers 600 mL/hr at 06/21/20 1420     LOS: 3 days     Georgette Shell, MD  06/22/2020, 12:27 PM

## 2020-06-22 NOTE — TOC Initial Note (Signed)
Transition of Care [facility]) - Initial/Assessment Note    Patient Details  Name: Joanne Klein MRN: [id number] Date of Birth: 07-Nov-1946  Transition of Care [facility]) CM/SW Contact:    Alexander Mt, LCSW Phone Number: 06/22/2020, 2:11 PM  Clinical Narrative:                 CSW spoke with pt at bedside. Introduced self, role, reason for visit. Pt from home alone with her cat. She confirms home address and PCP. It is hard to get much information from pt, she is restless in the bed; rolling around during assessment and requesting this writer to rub her legs. CSW again explained role.   Pt states her niece Bernadette Hoit is her primary contact [phone]). She usually uses a walker at home and various pieces of furniture. Per her report she also has two canes. We discussed that therapy would likely come and see her (to which she tells this writer it will not be today). Pt understands when they do they will make recommendations, she has had outpatient PT before.   TOC team will continue to follow.   Expected Discharge Plan: [place] (vs Home with Home Health) Barriers to Discharge: Continued Medical Work up, Ship broker   Patient Goals and CMS Choice   CMS Medicare.gov Compare Post Acute Care list provided to:: Patient Choice offered to / list presented to : Patient  Expected Discharge Plan and Services Expected Discharge Plan: [place] (vs Home with [place]) In-house Referral: Clinical Social Work Discharge Planning Services: CM Consult Post Acute Care Choice: [place], [place], [place] Living arrangements for the past 2 months: [place]   Prior Living Arrangements/Services Living arrangements for the past 2 months: Single Family Home Lives with:: Self, Pets Patient language and need for interpreter reviewed:: Yes (no needs) Do you feel safe going back to the place where you live?: Yes      Need for  Family Participation in Patient Care: Yes (Comment) (assistance with care as needed) Care giver support system in place?: Yes (comment) (has daughter and niece, unclear how hands on they are with care) Current home services: DME Criminal Activity/Legal Involvement Pertinent to Current Situation/Hospitalization: No - Comment as needed  Activities of Daily Living Home Assistive Devices/Equipment: Cane (specify quad or straight), Eyeglasses, Hand-held shower hose, Long-handled shoehorn, Reacher (straight ) ADL Screening (condition at time of admission) Patient's cognitive ability adequate to safely complete daily activities?: Yes Is the patient deaf or have difficulty hearing?: No Does the patient have difficulty seeing, even when wearing glasses/contacts?: No Does the patient have difficulty concentrating, remembering, or making decisions?: Yes Patient able to express need for assistance with ADLs?: Yes Does the patient have difficulty dressing or bathing?: Yes Independently performs ADLs?: No Communication: Independent Dressing (OT): Independent with device (comment) (graber/ reacher ) Grooming: Independent Feeding: Independent Bathing: Needs assistance Is this a change from baseline?: Change from baseline, expected to last >3 days Toileting: Independent with device (comment) (has high riser with items around to push up on) In/Out Bed: Independent with device (comment) (uses furniture at home ) Walks in Home: Independent with device (comment) (uses cane/furniture ) Does the patient have difficulty walking or climbing stairs?: Yes Weakness of Legs: Both Weakness of Arms/Hands: None  Permission Sought/Granted Permission sought to share information with : Family Supports Permission granted to share information with : Yes, Verbal Permission Granted  Share Information with NAME: Cher Nakai  Permission granted to share info w Relationship: niece  Permission granted to share info w  Contact Information: [phone]  Emotional Assessment Appearance:: Appears stated age Attitude/Demeanor/Rapport: Guarded, Self-Absorbed Affect (typically observed): Restless, Withdrawn Orientation: : Oriented to Self, Oriented to Place, Oriented to  Time, Oriented to Situation Alcohol / Substance Use: Not Applicable Psych Involvement:  (unknown)  Admission diagnosis:  Weakness [R53.1] Patient Active Problem List   Diagnosis Date Noted  . Pain   . Lumbar radiculopathy   . Weakness of both legs 06/19/2020  . Falls frequently 06/19/2020  . Weakness 06/19/2020  . Chronic respiratory failure with hypoxia ([place]) 12/15/2019  . Orthostatic hypotension 12/15/2019  . Numbness and tingling 12/15/2019  . Chemotherapy-induced neutropenia ([place])   . Thrombocytopenia ([place]) 10/17/2019  . Anal cancer ([place]) 09/15/2019  . Routine general medical examination at a health care facility 11/19/2016  . 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 Morbid obesity ([place]) 06/26/2014  . COPD GOLD II    . Hyperlipidemia   . Arthritis 01/16/2014  . Depression 01/13/2014  . Hypothyroidism 01/13/2014  . GERD (gastroesophageal reflux disease) 01/13/2014  . OCD (obsessive compulsive disorder) 01/13/2014  . Fibromyalgia 01/13/2014   PCP:  Hoyt Koch, MD Pharmacy:   [facility] [place], [address] [place] [address] Phone: [phone] Fax: [phone]  [place], [address], [address] [place], [address] [place] [address] Phone: [phone] Fax: [phone]  Readmission Risk Interventions Readmission Risk Prevention Plan 06/22/2020  Transportation Screening Complete  PCP or Specialist Appt within 5-7 Days Not Complete  Not Complete comments disposition pending  Home Care Screening Complete  Medication Review (RN CM) Referral to Pharmacy  Some recent  data might be hidden

## 2020-06-22 NOTE — Progress Notes (Signed)
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 After discussing with my attending, would not recommend this surgery. Risk of surgical procedure/general anesthesia outweigh the benefit of tissue diagnosis. Area is as risk for non-healing.   Please call with questions or concerns   Obie Dredge, [facility]

## 2020-06-22 NOTE — Progress Notes (Signed)
IR requested by Dr. Rodena Piety for possible image-guided myositis biopsy.  Case/images reviewed by Dr. Earleen Newport who states no indication for biopsy at this time, as MRI is used for diagnosis of myositis. No plans for IR interventions at this time- will delete order. Dr. Rodena Piety made aware.  IR available in future if needed.   Bea Graff Levern Kalka, PA-C 06/22/2020, 12:08 PM

## 2020-06-23 ENCOUNTER — Ambulatory Visit (HOSPITAL_COMMUNITY): Admission: RE | Admit: 2020-06-23 | Payer: Medicare Other | Source: Ambulatory Visit

## 2020-06-23 LAB — CK: Total CK: 82 U/L (ref 38–234)

## 2020-06-23 MED ORDER — PREDNISONE 50 MG PO TABS
60.0000 mg | ORAL_TABLET | Freq: Every day | ORAL | Status: DC
  Administered 2020-06-23: 60 mg via ORAL
  Filled 2020-06-23: qty 1

## 2020-06-23 MED ORDER — PREDNISONE 10 MG PO TABS
ORAL_TABLET | ORAL | 0 refills | Status: DC
Start: 2020-06-23 — End: 2020-07-24

## 2020-06-23 MED ORDER — SENNOSIDES-DOCUSATE SODIUM 8.6-50 MG PO TABS: 1.0000 | ORAL_TABLET | Freq: Every evening | ORAL | Status: DC | PRN

## 2020-06-23 NOTE — Discharge Summary (Signed)
Physician Discharge Summary  MALASHIA KAMAKA IWP:[id number] DOB: 1946-04-18 DOA: 06/19/2020  PCP: Hoyt Koch, MD  Admit date: 06/19/2020 Discharge date: 06/23/2020  Admitted From: Home Disposition: Home  recommendations for Outpatient Follow-up:  1. Follow up with PCP in 1-2 weeks 2. Please obtain BMP/CBC in one week Please follow up with Dr. Benay Spice Home Health: Physical therapy  equipment/Devices: None Discharge Condition stable CODE STATUS: DNR Diet recommendation: Cardiac diet  brief/Interim Summary:Bannie L Chanelis a 74 y.o.femalewith medical history significant foranal cancer status postradiation and chemotherapy completed on 11/2019, COPD, hypothyroidism, depressionwho presents on 6/29/2021as a direct admission from cancer center withlower extremity weakness, numbness and tingling has been ongoing for 2 months but has progressively worsened in the past week.  She was last evaluated on 6/24 in the ED for similar complaints MRI of lumbar/thoracic spine at that time showed no acute etiologies as imaging was only consistent with chronic degenerative changes and some mild stenosis.  Since then she has had at least3 falls related to her knees locking, having numbness and tingling that starts in her feet with sharp pain behind her knees that radiates up to her thighs. She denies any headache, changes in vision, upper arm weakness, urinary or fecal incontinence, no fevers or chills, no recent sick contacts. 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 Labs were obtained at that office visit which were essentially unremarkable.  Interim history-patient continues to complain of bilateral leg upper and lower leg pain and tingling radiating from her buttocks to the feet.  She is complaining of 10 out of 10 pain and asking for  stronger pain medications.   Discharge Diagnoses:  Active Problems:   Depression   Hypothyroidism   COPD GOLD II    Hyperlipidemia   Morbid obesity (HCC)   Numbness and tingling   Weakness of both legs   Falls frequently   Weakness   Lumbar radiculopathy   Pain   #1 bilateral lower extremity weakness numbness and tingling and unsteady gait unable to walk-   MRI of the C-spine thoracic spine lumbar spine no significant stenosis and no acute changes.   MRI of the brain chronic small vessel ischemic changes and chronic microhemorrhage in the right posterior frontal white matter MRI pelvis new presacral edema along with edema tracking along the iliacus muscle piriformis muscles central gluteus muscle and bilateral hip adductor musculature in a symmetric fraction.  This is also accompanied by low-grade enhancement.  This may reflect a myositis and association with prior regional radiation therapy is not excluded. Discussed with interventional radiology and general surgery regarding biopsying the site of inflammation.  They did not favor a biopsy at this time to rule out myositis or necrosis.  She will be discharged home today on an prednisone taper.  She will follow up with her PCP and oncologist Dr. Benay Spice.  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 Benay Spice.  #3 B12 deficiency B12 low end of normal continue B12 at home.  #4 history of depression anxiety continue home meds sertraline and Xanax  #5 hypothyroidism normal TSH continue Synthroid  #6 history of essential hypertension continue hydralazine  #7 history of hyperlipidemia on atorvastatin  #8 history of COPD stable continue inhalers.  #9 goals of care patient is DNR on admission   Estimated body mass index is 34.8 kg/m as calculated  from the following:   Height as of an earlier encounter on 06/19/20: 5\' 6"  (1.676 m).   Weight as of this encounter: 97.8  kg.  Discharge Instructions  Discharge Instructions    Diet - low sodium heart healthy   Complete by: As directed    Increase activity slowly   Complete by: As directed      Allergies as of 06/23/2020      Reactions   Prozac [fluoxetine Hcl] Anaphylaxis   Codeine Itching   Morphine And Related Itching   Sulfa Antibiotics Itching, Nausea Only      Medication List    STOP taking these medications   loperamide 2 MG capsule Commonly known as: IMODIUM     TAKE these medications   atorvastatin 20 MG tablet Commonly known as: LIPITOR TAKE 1 TABLET BY MOUTH  EVERY OTHER DAY What changed:   when to take this  additional instructions   calcium carbonate 500 MG chewable tablet Commonly known as: TUMS - dosed in mg elemental calcium Chew 1 tablet by mouth daily as needed.   levothyroxine 75 MCG tablet Commonly known as: SYNTHROID TAKE 1 TABLET BY MOUTH  DAILY BEFORE BREAKFAST What changed: See the new instructions.   MIRALAX PO Take 17 g by mouth at bedtime.   omeprazole 40 MG capsule Commonly known as: PRILOSEC TAKE 1 CAPSULE BY MOUTH  DAILY   ondansetron 8 MG tablet Commonly known as: ZOFRAN Take 8 mg by mouth every 8 (eight) hours as needed for nausea or vomiting.   oxyCODONE-acetaminophen 10-325 MG tablet Commonly known as: PERCOCET Take 1 tablet by mouth every 4 (four) hours. Gets from another provider @@ 180 per month   phenylephrine-shark liver oil-mineral oil-petrolatum 0.25-3-14-71.9 % rectal ointment Commonly known as: PREPARATION H Place 1 application rectally 2 (two) times daily as needed for hemorrhoids.   potassium chloride SA 20 MEQ tablet Commonly known as: KLOR-CON Take 1 tablet (20 mEq total) by mouth daily.   predniSONE 10 MG tablet Commonly known as: DELTASONE Prednisone 60 mg daily( 6 tablets) daily for 1 week Then 50 mg daily(5 tablets) daily for the next week Then 40 mg daily 4 tablets daily for the following week Then 30 mg daily 3  tablets daily for the following week Then 20 mg daily that is 2 tablets daily for the following week Then 10 mg daily for 1 week or till all tablets are done.   prochlorperazine 10 MG tablet Commonly known as: COMPAZINE Take 10 mg by mouth every 6 (six) hours as needed for nausea or vomiting.   senna-docusate 8.6-50 MG tablet Commonly known as: Senokot-S Take 1 tablet by mouth at bedtime as needed for mild constipation.   sertraline 100 MG tablet Commonly known as: ZOLOFT TAKE 2 TABLETS BY MOUTH AT  BEDTIME What changed: when to take this   Vitamin D-3 25 MCG (1000 UT) Caps Take 1,000 Units by mouth daily.       Follow-up Information    Hoyt Koch, MD Follow up.   Specialty: Internal Medicine Contact information: [place] [id number] [phone]              Allergies  Allergen Reactions  . Prozac [Fluoxetine Hcl] Anaphylaxis  . Codeine Itching  . Morphine And Related Itching  . Sulfa Antibiotics Itching and Nausea Only    Consultations:  Neurology   Procedures/Studies: DG Ankle Complete Right  Result Date: 05/26/2020 CLINICAL DATA:  BILATERAL lower extremity weakness  and pain, frequent falls EXAM: RIGHT ANKLE - COMPLETE 3+ VIEW COMPARISON:  None FINDINGS: Osseous demineralization. Ankle joint space preserved. Diffuse soft tissue swelling greatest laterally. Minimally displaced oblique fracture of the lateral malleolus. No additional fracture, dislocation, or bone destruction. IMPRESSION: Minimally displaced oblique fracture of the lateral malleolus with overlying soft tissue swelling. Electronically Signed   By: Lavonia Dana M.D.   On: 05/26/2020 20:14   CT Lumbar Spine Wo Contrast  Result Date: 05/26/2020 CLINICAL DATA:  Initial evaluation lower back pain for 1 month, with bilateral lower extremity weakness, frequent falls. EXAM: CT LUMBAR SPINE WITHOUT CONTRAST TECHNIQUE: Multidetector CT imaging of the lumbar spine was performed  without intravenous contrast administration. Multiplanar CT image reconstructions were also generated. COMPARISON:  Prior CT from 09/14/2019. FINDINGS: Segmentation: Standard. Lowest well-formed disc space labeled the L5-S1 level. Alignment: Mild levoscoliosis with apex at T12-L1.  No listhesis. Vertebrae: Vertebral body height maintained without evidence for acute or chronic fracture. Visualized sacrum and pelvis intact. No discrete or worrisome osseous lesions. Paraspinal and other soft tissues: Chronic fatty atrophy noted within the posterior paraspinous soft tissues. There is suggestion of soft tissue density/stranding involving the presacral space anterior to the sacrum (series 5, image 139), incompletely visualized. Findings are nonspecific, and could reflect post radiation changes related to prior uterine cancer. Paraspinous soft tissues demonstrate no other acute finding. Moderate aorto bi-iliac atherosclerotic disease without aneurysm. Cholecystectomy clips noted. Disc levels: T11-12: Degenerative intervertebral disc space narrowing with disc desiccation and mild disc bulge. No significant stenosis. T12-L1: Degenerative intervertebral disc space narrowing with disc desiccation and mild disc bulge. Mild facet hypertrophy. No significant stenosis. L1-2: Mild diffuse disc bulge with disc desiccation. Mild-to-moderate facet hypertrophy. No significant stenosis. L2-3: Mild disc bulge. Mild to moderate facet hypertrophy. No significant spinal stenosis. Foramina remain patent. L3-4: Chronic intervertebral disc space narrowing with diffuse disc bulge. Moderate bilateral facet hypertrophy. No spinal stenosis. Mild bilateral L3 foraminal narrowing. L4-5: Chronic intervertebral disc space narrowing with diffuse disc bulge and disc desiccation. Moderate bilateral facet hypertrophy. Resultant mild canal with bilateral lateral recess stenosis. Mild right with moderate left L4 foraminal narrowing. L5-S1: Chronic  intervertebral disc space narrowing with diffuse disc bulge. Mild reactive endplate changes. Moderate left worse than right facet hypertrophy. No significant spinal stenosis. Moderate bilateral L5 foraminal narrowing, greater on the left. IMPRESSION: 1. No acute abnormality within the lumbar spine. 2. Multilevel degenerative spondylosis with resultant mild canal with bilateral lateral recess stenosis at L4-5. 3. Moderate bilateral L4 and L5 foraminal stenosis related to disc bulge, reactive endplate changes, and facet degeneration. 4. Soft tissue density/stranding involving the presacral space anterior to the sacrum, incompletely visualized. Findings are nonspecific, and could reflect post radiation changes related to prior malignancy. Correlation with history recommended. If there is no history of prior radiation treatment, further assessment with dedicated CT of the abdomen and pelvis could be performed for further characterization as warranted. 5. Aortic Atherosclerosis (ICD10-I70.0). Electronically Signed   By: Jeannine Boga M.D.   On: 05/26/2020 23:33   MR BRAIN W WO CONTRAST  Result Date: 06/21/2020 CLINICAL DATA:  Subacute neuro deficit EXAM: MRI HEAD WITHOUT AND WITH CONTRAST TECHNIQUE: Multiplanar, multiecho pulse sequences of the brain and surrounding structures were obtained without and with intravenous contrast. CONTRAST:  9.7mL GADAVIST GADOBUTROL 1 MMOL/ML IV SOLN COMPARISON:  None. FINDINGS: Brain: Mild atrophy. Negative for hydrocephalus. Patchy white matter hyperintensity bilaterally. Patchy hyperintensity in the pons bilaterally. Negative for acute infarct or mass. Chronic microhemorrhage  right frontal white matter. No enhancing lesion identified postcontrast. Vascular: Normal arterial flow voids. Skull and upper cervical spine: No focal skeletal lesion. Sinuses/Orbits: Mild mucosal edema paranasal sinuses. Negative orbit. Other: None IMPRESSION: No acute abnormality Atrophy and mild to  moderate chronic microvascular ischemic change in the white matter and pons. Chronic microhemorrhage right posterior frontal white matter. Electronically Signed   By: Franchot Gallo M.D.   On: 06/21/2020 15:26   MR THORACIC SPINE WO CONTRAST  Result Date: 06/14/2020 CLINICAL DATA:  Progressive lower extremity weakness. Back pain. History of anal cancer and uterine cancer MRI performed with assistance of anesthesiology for claustrophobia. EXAM: MRI THORACIC AND LUMBAR SPINE WITHOUT CONTRAST TECHNIQUE: Multiplanar and multiecho pulse sequences of the thoracic and lumbar spine were obtained without intravenous contrast. COMPARISON:  CT lumbar spine 05/26/2020 FINDINGS: MRI THORACIC SPINE FINDINGS Alignment:  Normal.  Mild thoracic kyphosis. Vertebrae: Negative for fracture or mass.  No bone marrow edema. Cord:  Normal signal and morphology.  No cord compression. Paraspinal and other soft tissues: Common bile duct 11.5 mm. Intrahepatic ductal dilatation. This is likely due to postop cholecystectomy. Correlate with liver function test. Disc levels: Small right-sided disc protrusion T11-12 without significant spinal or foraminal stenosis. Disc degeneration with endplate spurring at A83-M1 without stenosis. Otherwise no significant degenerative change in the thoracic spine. MRI LUMBAR SPINE FINDINGS Segmentation:  Normal Alignment:  Normal Vertebrae:  Negative for fracture or mass.  No bone marrow edema. Fatty changes in the bone marrow at L5 and the sacrum compatible with prior pelvic radiation. Conus medullaris and cauda equina: Conus extends to the T12-L1 level. Conus and cauda equina appear normal. Paraspinal and other soft tissues: Mild biliary dilatation as described above. No paraspinous mass or adenopathy. Disc levels: L1-2: Disc bulging and mild endplate spurring. No significant stenosis. L2-3: Mild disc bulging and mild facet degeneration. Negative for stenosis L3-4: Diffuse bulging of the disc with shallow  central disc protrusion. Mild facet hypertrophy. Negative for spinal or foraminal stenosis. L4-5: Disc degeneration with disc bulging and mild spurring. Mild facet degeneration. Mild to moderate left subarticular stenosis. Spinal canal mildly narrowed without significant stenosis. L5-S1: Mild disc degeneration and disc bulging. Negative for neural impingement or stenosis. Mild facet degeneration bilaterally. IMPRESSION: MR THORACIC SPINE IMPRESSION Disc degeneration at T11-T12 and T12-L1. Small right-sided disc protrusion at T11-12. No significant stenosis. Negative for lumbar fracture. Biliary dilatation likely related to cholecystectomy. Correlate with liver function test. MR LUMBAR SPINE IMPRESSION Multilevel mild degenerative changes in the lumbar spine similar to the recent CT. Mild to moderate subarticular stenosis on the left at L4-5. Negative for fracture or mass. Electronically Signed   By: Franchot Gallo M.D.   On: 06/14/2020 18:27   MR THORACIC SPINE W CONTRAST  Result Date: 06/21/2020 CLINICAL DATA:  Anal cancer post radiation and chemotherapy, lower extremity weakness, numbness and tingling with progressive worsening; multiple falls since prior noncontrast study EXAM: MRI CERVICAL WITHOUT AND WITH CONTRAST THORACIC AND LUMBAR SPINE WITH CONTRAST TECHNIQUE: Multiplanar and multiecho pulse sequences of the cervical spine, to include the craniocervical junction and cervicothoracic junction, and thoracic and lumbar spine, were obtained without and with intravenous contrast. Multiplanar multi-echo sequences of the thoracic and lumbar spine were obtained with intravenous contrast. CONTRAST:  9.9 mL Gadavist COMPARISON:  Noncontrast studies 06/14/2020, 03/08/2019 FINDINGS: MRI CERVICAL SPINE Alignment: Stable. Vertebrae: Stable vertebral body heights. No new marrow edema suspicious osseous lesion. Cord: No abnormal signal.  No abnormal intrathecal enhancement. Posterior Fossa, vertebral  arteries, paraspinal  tissues: Unremarkable. Disc levels: Degenerative changes primarily from C4-C5 to C6-C7 are stable in appearance including small left central disc protrusion at C5-C6. There is stable left greater than right foraminal stenosis at this level. MRI THORACIC SPINE Alignment:  Stable Vertebrae: Stable vertebral body heights. There is minor degenerative marrow enhancement. No suspicious osseous lesion. Cord:  No abnormal intrathecal enhancement Paraspinal and other soft tissues: Unremarkable. Disc levels: Multilevel degenerative changes better evaluated on prior study. MRI LUMBAR SPINE Alignment:  Stable. Vertebrae: Stable vertebral body heights. Minor degenerative marrow enhancement. No suspicious osseous lesion. Conus medullaris and cauda equina: No abnormal intrathecal enhancement. Conus and cauda equina appear normal. Paraspinal and other soft tissues: Unremarkable. Disc levels: Multilevel degenerative changes better evaluated prior study. IMPRESSION: No abnormal cervical cord signal. Mild cervical degenerative changes without high-grade stenosis. No significant abnormal enhancement in the thoracolumbar spine. Electronically Signed   By: Macy Mis M.D.   On: 06/21/2020 15:24   MR LUMBAR SPINE WO CONTRAST  Result Date: 06/14/2020 CLINICAL DATA:  Progressive lower extremity weakness. Back pain. History of anal cancer and uterine cancer MRI performed with assistance of anesthesiology for claustrophobia. EXAM: MRI THORACIC AND LUMBAR SPINE WITHOUT CONTRAST TECHNIQUE: Multiplanar and multiecho pulse sequences of the thoracic and lumbar spine were obtained without intravenous contrast. COMPARISON:  CT lumbar spine 05/26/2020 FINDINGS: MRI THORACIC SPINE FINDINGS Alignment:  Normal.  Mild thoracic kyphosis. Vertebrae: Negative for fracture or mass.  No bone marrow edema. Cord:  Normal signal and morphology.  No cord compression. Paraspinal and other soft tissues: Common bile duct 11.5 mm. Intrahepatic ductal  dilatation. This is likely due to postop cholecystectomy. Correlate with liver function test. Disc levels: Small right-sided disc protrusion T11-12 without significant spinal or foraminal stenosis. Disc degeneration with endplate spurring at J18-A4 without stenosis. Otherwise no significant degenerative change in the thoracic spine. MRI LUMBAR SPINE FINDINGS Segmentation:  Normal Alignment:  Normal Vertebrae:  Negative for fracture or mass.  No bone marrow edema. Fatty changes in the bone marrow at L5 and the sacrum compatible with prior pelvic radiation. Conus medullaris and cauda equina: Conus extends to the T12-L1 level. Conus and cauda equina appear normal. Paraspinal and other soft tissues: Mild biliary dilatation as described above. No paraspinous mass or adenopathy. Disc levels: L1-2: Disc bulging and mild endplate spurring. No significant stenosis. L2-3: Mild disc bulging and mild facet degeneration. Negative for stenosis L3-4: Diffuse bulging of the disc with shallow central disc protrusion. Mild facet hypertrophy. Negative for spinal or foraminal stenosis. L4-5: Disc degeneration with disc bulging and mild spurring. Mild facet degeneration. Mild to moderate left subarticular stenosis. Spinal canal mildly narrowed without significant stenosis. L5-S1: Mild disc degeneration and disc bulging. Negative for neural impingement or stenosis. Mild facet degeneration bilaterally. IMPRESSION: MR THORACIC SPINE IMPRESSION Disc degeneration at T11-T12 and T12-L1. Small right-sided disc protrusion at T11-12. No significant stenosis. Negative for lumbar fracture. Biliary dilatation likely related to cholecystectomy. Correlate with liver function test. MR LUMBAR SPINE IMPRESSION Multilevel mild degenerative changes in the lumbar spine similar to the recent CT. Mild to moderate subarticular stenosis on the left at L4-5. Negative for fracture or mass. Electronically Signed   By: Franchot Gallo M.D.   On: 06/14/2020 18:27    MR LUMBAR SPINE W CONTRAST  Result Date: 06/21/2020 CLINICAL DATA:  Anal cancer post radiation and chemotherapy, lower extremity weakness, numbness and tingling with progressive worsening; multiple falls since prior noncontrast study EXAM: MRI CERVICAL WITHOUT AND WITH  CONTRAST THORACIC AND LUMBAR SPINE WITH CONTRAST TECHNIQUE: Multiplanar and multiecho pulse sequences of the cervical spine, to include the craniocervical junction and cervicothoracic junction, and thoracic and lumbar spine, were obtained without and with intravenous contrast. Multiplanar multi-echo sequences of the thoracic and lumbar spine were obtained with intravenous contrast. CONTRAST:  9.9 mL Gadavist COMPARISON:  Noncontrast studies 06/14/2020, 03/08/2019 FINDINGS: MRI CERVICAL SPINE Alignment: Stable. Vertebrae: Stable vertebral body heights. No new marrow edema suspicious osseous lesion. Cord: No abnormal signal.  No abnormal intrathecal enhancement. Posterior Fossa, vertebral arteries, paraspinal tissues: Unremarkable. Disc levels: Degenerative changes primarily from C4-C5 to C6-C7 are stable in appearance including small left central disc protrusion at C5-C6. There is stable left greater than right foraminal stenosis at this level. MRI THORACIC SPINE Alignment:  Stable Vertebrae: Stable vertebral body heights. There is minor degenerative marrow enhancement. No suspicious osseous lesion. Cord:  No abnormal intrathecal enhancement Paraspinal and other soft tissues: Unremarkable. Disc levels: Multilevel degenerative changes better evaluated on prior study. MRI LUMBAR SPINE Alignment:  Stable. Vertebrae: Stable vertebral body heights. Minor degenerative marrow enhancement. No suspicious osseous lesion. Conus medullaris and cauda equina: No abnormal intrathecal enhancement. Conus and cauda equina appear normal. Paraspinal and other soft tissues: Unremarkable. Disc levels: Multilevel degenerative changes better evaluated prior study.  IMPRESSION: No abnormal cervical cord signal. Mild cervical degenerative changes without high-grade stenosis. No significant abnormal enhancement in the thoracolumbar spine. Electronically Signed   By: Macy Mis M.D.   On: 06/21/2020 15:24   MR CERVICAL SPINE W WO CONTRAST  Result Date: 06/21/2020 CLINICAL DATA:  Anal cancer post radiation and chemotherapy, lower extremity weakness, numbness and tingling with progressive worsening; multiple falls since prior noncontrast study EXAM: MRI CERVICAL WITHOUT AND WITH CONTRAST THORACIC AND LUMBAR SPINE WITH CONTRAST TECHNIQUE: Multiplanar and multiecho pulse sequences of the cervical spine, to include the craniocervical junction and cervicothoracic junction, and thoracic and lumbar spine, were obtained without and with intravenous contrast. Multiplanar multi-echo sequences of the thoracic and lumbar spine were obtained with intravenous contrast. CONTRAST:  9.9 mL Gadavist COMPARISON:  Noncontrast studies 06/14/2020, 03/08/2019 FINDINGS: MRI CERVICAL SPINE Alignment: Stable. Vertebrae: Stable vertebral body heights. No new marrow edema suspicious osseous lesion. Cord: No abnormal signal.  No abnormal intrathecal enhancement. Posterior Fossa, vertebral arteries, paraspinal tissues: Unremarkable. Disc levels: Degenerative changes primarily from C4-C5 to C6-C7 are stable in appearance including small left central disc protrusion at C5-C6. There is stable left greater than right foraminal stenosis at this level. MRI THORACIC SPINE Alignment:  Stable Vertebrae: Stable vertebral body heights. There is minor degenerative marrow enhancement. No suspicious osseous lesion. Cord:  No abnormal intrathecal enhancement Paraspinal and other soft tissues: Unremarkable. Disc levels: Multilevel degenerative changes better evaluated on prior study. MRI LUMBAR SPINE Alignment:  Stable. Vertebrae: Stable vertebral body heights. Minor degenerative marrow enhancement. No suspicious  osseous lesion. Conus medullaris and cauda equina: No abnormal intrathecal enhancement. Conus and cauda equina appear normal. Paraspinal and other soft tissues: Unremarkable. Disc levels: Multilevel degenerative changes better evaluated prior study. IMPRESSION: No abnormal cervical cord signal. Mild cervical degenerative changes without high-grade stenosis. No significant abnormal enhancement in the thoracolumbar spine. Electronically Signed   By: Macy Mis M.D.   On: 06/21/2020 15:24   MR PELVIS W WO CONTRAST  Result Date: 06/21/2020 CLINICAL DATA:  Anal cancer with prior radiation therapy and chemotherapy common a numbness and tingling in the lower extremities with weakness. EXAM: MRI PELVIS WITHOUT AND WITH CONTRAST TECHNIQUE: Multiplanar multisequence MR  imaging of the pelvis was performed both before and after administration of intravenous contrast. CONTRAST:  9.41mL GADAVIST GADOBUTROL 1 MMOL/ML IV SOLN COMPARISON:  CT pelvis 12/27/2019 FINDINGS: Urinary Tract:  Unremarkable Bowel: Extensive sigmoid colon diverticulosis, no active diverticulitis identified. No clearly defined mass or asymmetry in the anal/perianal region or in the adjacent rectum. Please note that today's exam was not performed using the rectal protocol. Vascular/Lymphatic: Common iliac atherosclerosis. No pathologic adenopathy identified. Reproductive:  Uterus absent.  Adnexa unremarkable. Other:  No supplemental non-categorized findings. Musculoskeletal: There is mild presacral edema along with edema tracking along and within the iliacus muscles, piriformis muscles, central gluteus muscles, obturator internus muscles, and bilateral hip adductor musculature in a symmetric fashion. This edema is also accompanied by low-grade enhancement in the same structures proportionate the to the degree of edema. No drainable abscess or masslike lesion is appreciated. Given the bilateral symmetry this may well represent myositis, possibly related to  prior radiation therapy. However, the degree of presacral edema is increased compared to 12/27/2019 and by report the patient has prior radiation therapy was completed in December 2020. There is accentuated T1 signal in the sacrum and adjacent portion of the iliac bones as well as the pubic rami, compatible with prior radiation therapy. No mass lesion is identified along the sacral plexus, sciatic notch, or sciatic nerves. No significant asymmetry or accentuation of T2 signal or enhancement in the sciatic nerves to specifically indicate sciatic neuropathy. IMPRESSION: 1. No clearly defined mass or asymmetry in the anal/perianal region or adjacent rectum. 2. There is mild but new presacral edema along with edema tracking along and within the iliacus muscles, piriformis muscles, central gluteus muscles, and bilateral hip adductor musculature in a symmetric fashion. This is also accompanied by low-grade enhancement. This may reflect myositis, and association with prior regional radiation therapy is not excluded. 3. Extensive sigmoid colon diverticulosis, no active diverticulitis identified. 4. Common iliac atherosclerosis. Electronically Signed   By: Van Clines M.D.   On: 06/21/2020 17:57   DG Knee Complete 4 Views Left  Result Date: 05/26/2020 CLINICAL DATA:  BILATERAL lower extremity weakness and pain, multiple falls EXAM: LEFT KNEE - COMPLETE 4+ VIEW COMPARISON:  10/16/2019 FINDINGS: Osseous demineralization. Joint spaces preserved. No acute fracture, dislocation, or bone destruction. No joint effusion. IMPRESSION: No acute osseous abnormalities. Electronically Signed   By: Lavonia Dana M.D.   On: 05/26/2020 20:15   DG Knee Complete 4 Views Right  Result Date: 05/26/2020 CLINICAL DATA:  Pain and weakness in BILATERAL lower extremities, frequent falls EXAM: RIGHT KNEE - COMPLETE 4+ VIEW COMPARISON:  None FINDINGS: Mild osseous demineralization. Joint space narrowing diffusely. Tiny cortical avulsion  fracture identified at the medial margin of the medial femoral condyle superiorly. No additional fracture, dislocation, or bone destruction. IMPRESSION: Small cortical avulsion fracture identified at medial margin of the medial femoral condyle, potentially at medial collateral ligament origin. No additional acute osseous abnormalities. Electronically Signed   By: Lavonia Dana M.D.   On: 05/26/2020 20:13    (Echo, Carotid, EGD, Colonoscopy, ERCP)    Subjective:  Patient resting in bed wants to go home much more awake and alert and resting calmly compared to yesterday.  Able to move her legs but still with limitation however her pain is better.  She walked with physical therapy yesterday.  PT recommends home physical therapy. Discharge Exam: Vitals:   06/22/20 2146 06/23/20 0544  BP: (!) 141/83 (!) 179/82  Pulse: 84 78  Resp: 16 16  Temp: 98 F (36.7 C) 98.1 F (36.7 C)  SpO2: 94% 94%   Vitals:   06/22/20 1459 06/22/20 2146 06/23/20 0544 06/23/20 0741  BP: (!) 147/65 (!) 141/83 (!) 179/82   Pulse: 84 84 78   Resp: 18 16 16    Temp: (!) 97.5 F (36.4 C) 98 F (36.7 C) 98.1 F (36.7 C)   TempSrc: Oral Oral Oral   SpO2: 98% 94% 94%   Weight:    97.8 kg    General: Pt is alert, awake, not in acute distress Cardiovascular: RRR, S1/S2 +, no rubs, no gallops Respiratory: CTA bilaterally, no wheezing, no rhonchi Abdominal: Soft, NT, ND, bowel sounds + Extremities: no edema, no cyanosis    The results of significant diagnostics from this hospitalization (including imaging, microbiology, ancillary and laboratory) are listed below for reference.     Microbiology: Recent Results (from the past 240 hour(s))  SARS Coronavirus 2 by RT PCR (hospital order, performed in [facility] hospital lab) Nasopharyngeal Nasopharyngeal Swab     Status: None   Collection Time: 06/14/20 11:41 AM   Specimen: Nasopharyngeal Swab  Result Value Ref Range Status   SARS Coronavirus 2 NEGATIVE NEGATIVE  Final    Comment: (NOTE) SARS-CoV-2 target nucleic acids are NOT DETECTED.  The SARS-CoV-2 RNA is generally detectable in upper and lower respiratory specimens during the acute phase of infection. The lowest concentration of SARS-CoV-2 viral copies this assay can detect is 250 copies / mL. A negative result does not preclude SARS-CoV-2 infection and should not be used as the sole basis for treatment or other patient management decisions.  A negative result may occur with improper specimen collection / handling, submission of specimen other than nasopharyngeal swab, presence of viral mutation(s) within the areas targeted by this assay, and inadequate number of viral copies (<250 copies / mL). A negative result must be combined with clinical observations, patient history, and epidemiological information.  Fact Sheet for Patients:   StrictlyIdeas.no  Fact Sheet for Healthcare Providers: BankingDealers.co.za  This test is not yet approved or  cleared by the [place] FDA and has been authorized for detection and/or diagnosis of SARS-CoV-2 by FDA under an Emergency Use Authorization (EUA).  This EUA will remain in effect (meaning this test can be used) for the duration of the COVID-19 declaration under Section 564(b)(1) of the Act, 21 U.S.C. section 360bbb-3(b)(1), unless the authorization is terminated or revoked sooner.  Performed at [facility], [place] [address], [place], [place] [address]   SARS Coronavirus 2 by RT PCR (hospital order, performed in [facility] hospital lab) Nasopharyngeal Nasopharyngeal Swab     Status: None   Collection Time: 06/19/20  4:25 PM   Specimen: Nasopharyngeal Swab  Result Value Ref Range Status   SARS Coronavirus 2 NEGATIVE NEGATIVE Final    Comment: (NOTE) SARS-CoV-2 target nucleic acids are NOT DETECTED.  The SARS-CoV-2 RNA is generally detectable in upper and lower respiratory specimens  during the acute phase of infection. The lowest concentration of SARS-CoV-2 viral copies this assay can detect is 250 copies / mL. A negative result does not preclude SARS-CoV-2 infection and should not be used as the sole basis for treatment or other patient management decisions.  A negative result may occur with improper specimen collection / handling, submission of specimen other than nasopharyngeal swab, presence of viral mutation(s) within the areas targeted by this assay, and inadequate number of viral copies (<250 copies / mL). A negative result must be  combined with clinical observations, patient history, and epidemiological information.  Fact Sheet for Patients:   StrictlyIdeas.no  Fact Sheet for Healthcare Providers: BankingDealers.co.za  This test is not yet approved or  cleared by the [place] FDA and has been authorized for detection and/or diagnosis of SARS-CoV-2 by FDA under an Emergency Use Authorization (EUA).  This EUA will remain in effect (meaning this test can be used) for the duration of the COVID-19 declaration under Section 564(b)(1) of the Act, 21 U.S.C. section 360bbb-3(b)(1), unless the authorization is terminated or revoked sooner.  Performed at [facility], [place] [address], [place], [place] [address]      Labs: BNP (last 3 results) No results for input(s): BNP in the last 8760 hours. Basic Metabolic Panel: Recent Labs  Lab 06/19/20 0806 06/20/20 0559 06/22/20 1154  NA 140 140 138  K 4.8 3.9 3.9  CL 104 102 103  CO2 30 29 27   GLUCOSE 104* 101* 115*  BUN 17 20 30*  CREATININE 0.85 0.89 1.13*  CALCIUM 9.6 9.1 8.9   Liver Function Tests: Recent Labs  Lab 06/22/20 1154  AST 18  ALT 13  ALKPHOS 72  BILITOT 1.7*  PROT 6.0*  ALBUMIN 3.3*   No results for input(s): LIPASE, AMYLASE in the last 168 hours. No results for input(s): AMMONIA in the last 168  hours. CBC: Recent Labs  Lab 06/19/20 0806 06/20/20 0559 06/22/20 1154  WBC 5.1 4.3 4.8  NEUTROABS 3.6  --   --   HGB 13.0 12.0 12.1  HCT 38.0 35.1* 36.1  MCV 97.2 96.2 97.0  PLT 118* 102* 112*   Cardiac Enzymes: Recent Labs  Lab 06/22/20 1154 06/23/20 0202  CKTOTAL 85 82   BNP: Invalid input(s): POCBNP CBG: No results for input(s): GLUCAP in the last 168 hours. D-Dimer No results for input(s): DDIMER in the last 72 hours. Hgb A1c No results for input(s): HGBA1C in the last 72 hours. Lipid Profile No results for input(s): CHOL, HDL, LDLCALC, TRIG, CHOLHDL, LDLDIRECT in the last 72 hours. Thyroid function studies No results for input(s): TSH, T4TOTAL, T3FREE, THYROIDAB in the last 72 hours.  Invalid input(s): FREET3 Anemia work up No results for input(s): VITAMINB12, FOLATE, FERRITIN, TIBC, IRON, RETICCTPCT in the last 72 hours. Urinalysis    Component Value Date/Time   COLORURINE YELLOW 12/27/2019 1018   APPEARANCEUR CLEAR 12/27/2019 1018   LABSPEC 1.011 12/27/2019 1018   PHURINE 7.0 12/27/2019 1018   GLUCOSEU NEGATIVE 12/27/2019 1018   HGBUR NEGATIVE 12/27/2019 [place] 12/27/2019 1018   [place] 12/27/2019 1018   PROTEINUR NEGATIVE 12/27/2019 1018   UROBILINOGEN 1.0 04/20/2008 0800   NITRITE NEGATIVE 12/27/2019 1018   LEUKOCYTESUR TRACE (A) 12/27/2019 1018   Sepsis Labs Invalid input(s): PROCALCITONIN,  WBC,  LACTICIDVEN Microbiology Recent Results (from the past 240 hour(s))  SARS Coronavirus 2 by RT PCR (hospital order, performed in [place] hospital lab) Nasopharyngeal Nasopharyngeal Swab     Status: None   Collection Time: 06/14/20 11:41 AM   Specimen: Nasopharyngeal Swab  Result Value Ref Range Status   SARS Coronavirus 2 NEGATIVE NEGATIVE Final    Comment: (NOTE) SARS-CoV-2 target nucleic acids are NOT DETECTED.  The SARS-CoV-2 RNA is generally detectable in upper and lower respiratory specimens during the acute  phase of infection. The lowest concentration of SARS-CoV-2 viral copies this assay can detect is 250 copies / mL. A negative result does not preclude SARS-CoV-2 infection and should not be  used as the sole basis for treatment or other patient management decisions.  A negative result may occur with improper specimen collection / handling, submission of specimen other than nasopharyngeal swab, presence of viral mutation(s) within the areas targeted by this assay, and inadequate number of viral copies (<250 copies / mL). A negative result must be combined with clinical observations, patient history, and epidemiological information.  Fact Sheet for Patients:   StrictlyIdeas.no  Fact Sheet for Healthcare Providers: BankingDealers.co.za  This test is not yet approved or  cleared by the [place] FDA and has been authorized for detection and/or diagnosis of SARS-CoV-2 by FDA under an Emergency Use Authorization (EUA).  This EUA will remain in effect (meaning this test can be used) for the duration of the COVID-19 declaration under Section 564(b)(1) of the Act, 21 U.S.C. section 360bbb-3(b)(1), unless the authorization is terminated or revoked sooner.  Performed at [facility], [place] [address] [place], [place] [address]   SARS Coronavirus 2 by RT PCR (hospital order, performed in [facility] hospital lab) Nasopharyngeal Nasopharyngeal Swab     Status: None   Collection Time: 06/19/20  4:25 PM   Specimen: Nasopharyngeal Swab  Result Value Ref Range Status   SARS Coronavirus 2 NEGATIVE NEGATIVE Final    Comment: (NOTE) SARS-CoV-2 target nucleic acids are NOT DETECTED.  The SARS-CoV-2 RNA is generally detectable in upper and lower respiratory specimens during the acute phase of infection. The lowest concentration of SARS-CoV-2 viral copies this assay can detect is 250 copies / mL. A negative result does not preclude SARS-CoV-2  infection and should not be used as the sole basis for treatment or other patient management decisions.  A negative result may occur with improper specimen collection / handling, submission of specimen other than nasopharyngeal swab, presence of viral mutation(s) within the areas targeted by this assay, and inadequate number of viral copies (<250 copies / mL). A negative result must be combined with clinical observations, patient history, and epidemiological information.  Fact Sheet for Patients:   StrictlyIdeas.no  Fact Sheet for Healthcare Providers: BankingDealers.co.za  This test is not yet approved or  cleared by the [place] FDA and has been authorized for detection and/or diagnosis of SARS-CoV-2 by FDA under an Emergency Use Authorization (EUA).  This EUA will remain in effect (meaning this test can be used) for the duration of the COVID-19 declaration under Section 564(b)(1) of the Act, 21 U.S.C. section 360bbb-3(b)(1), unless the authorization is terminated or revoked sooner.  Performed at [facility], [place] [address] [place], [place] [address]      Time coordinating discharge: Over 39 minutes SIGNED:   Georgette Shell, MD  [facility] 06/23/2020, 10:33 AM  If 7PM-7AM, please contact night-coverage [email or web address] Password [email or web address]

## 2020-06-23 NOTE — TOC Transition Note (Signed)
Transition of Care [facility]) - CM/SW Discharge Note   Patient Details  Name: Joanne Klein MRN: [id number] Date of Birth: [birth year]  [facility] CM/SW Contact:  Claudie Leach, RN 06/23/2020, 12:06 PM   Clinical Narrative:    Patient to d/c home with HHPT.  Patient advised of Medicare rated list and has no preference of [place] agency.  Referral accepted by [facility]    Final next level of care: [place] Barriers to Discharge: No Barriers Identified   Patient Goals and CMS Choice Patient states their goals for this hospitalization and ongoing recovery are:: to get well CMS Medicare.gov Compare Post Acute Care list provided to:: Patient Choice offered to / list presented to : Patient   Discharge Plan and Services In-house Referral: Clinical Social Work Discharge Planning Services: CM Consult Post Acute Care Choice: Home Health, Durable Medical Equipment, [place]                    HH Arranged: PT [facility] Agency: [place] Date [facility] Agency Contacted: 06/23/20 Time [place] Agency Contacted: 1206 Representative spoke with at [place]: [place] Determinants of Health ([place]) Interventions     Readmission Risk Interventions Readmission Risk Prevention Plan 06/22/2020  Transportation Screening Complete  PCP or Specialist Appt within 5-7 Days Not Complete  Not Complete comments disposition pending  Home Care Screening Complete  Medication Review (RN CM) Referral to Pharmacy  Some recent data might be hidden

## 2020-06-26 ENCOUNTER — Ambulatory Visit (HOSPITAL_COMMUNITY): Payer: Medicare Other

## 2020-06-26 ENCOUNTER — Encounter: Payer: Self-pay | Admitting: *Deleted

## 2020-06-26 ENCOUNTER — Encounter (HOSPITAL_COMMUNITY): Payer: Self-pay

## 2020-06-26 ENCOUNTER — Telehealth: Payer: Self-pay | Admitting: *Deleted

## 2020-06-26 NOTE — Progress Notes (Signed)
Per Dr. Benay Spice, needs appointment week of 07/02/20 with Dr. Lisbeth Renshaw and Dr. Heloise Ochoa day if possible. Scheduling message sent.

## 2020-06-26 NOTE — Telephone Encounter (Signed)
Patient left VM with several questions: 1. Mychart has appointments for 9/17 and 9/21--asking if they can be on same day? MD has ordered for her to be seen next week and by radiation the same day. Scheduler will call. 2. Started on Prednisone--asking how to taper this? Take #6 tabs/day this week and each week reduce by #1 tablet to wean off slowly. 3. Was told to start Senna-S. Where does she get this? OTC at pharmacy 4. Was told to stop Imodium. Why? 5. Med list says Lipitor qod, but she takes it 4 days week. Take as you have in the past is OK 6. What are next steps? Will discuss at appointment next week. Attempted to return call. No answer. Left VM that RN will call her tomorrow.

## 2020-06-27 ENCOUNTER — Telehealth: Payer: Self-pay | Admitting: Internal Medicine

## 2020-06-27 DIAGNOSIS — M4726 Other spondylosis with radiculopathy, lumbar region: Secondary | ICD-10-CM | POA: Diagnosis not present

## 2020-06-27 DIAGNOSIS — M5124 Other intervertebral disc displacement, thoracic region: Secondary | ICD-10-CM | POA: Diagnosis not present

## 2020-06-27 DIAGNOSIS — M5137 Other intervertebral disc degeneration, lumbosacral region: Secondary | ICD-10-CM | POA: Diagnosis not present

## 2020-06-27 DIAGNOSIS — M5136 Other intervertebral disc degeneration, lumbar region: Secondary | ICD-10-CM | POA: Diagnosis not present

## 2020-06-27 DIAGNOSIS — M5134 Other intervertebral disc degeneration, thoracic region: Secondary | ICD-10-CM | POA: Diagnosis not present

## 2020-06-27 NOTE — Telephone Encounter (Signed)
    [facility] calling to request order for home PT and [place]  PT  1x week for 1 2x week for 3 1x week for 2  [facility] 2x week for 3 weeks   Phone [phone]

## 2020-06-27 NOTE — Telephone Encounter (Signed)
Called patient and answered all her questions to her satisfaction.

## 2020-06-28 NOTE — Telephone Encounter (Signed)
I cannot sign off on any home health orders as I have not seen within 90 days

## 2020-06-28 NOTE — Telephone Encounter (Signed)
Joanne Klein has been informed that [place] orders can not be signed off on until pt is seen.

## 2020-06-29 ENCOUNTER — Telehealth: Payer: Self-pay | Admitting: Oncology

## 2020-06-29 NOTE — Telephone Encounter (Signed)
Scheduled appt per 7/09 sch msg - unable to reach pt .left message with appt date and time

## 2020-07-02 DIAGNOSIS — M797 Fibromyalgia: Secondary | ICD-10-CM | POA: Diagnosis not present

## 2020-07-02 DIAGNOSIS — G894 Chronic pain syndrome: Secondary | ICD-10-CM | POA: Diagnosis not present

## 2020-07-02 DIAGNOSIS — Z79891 Long term (current) use of opiate analgesic: Secondary | ICD-10-CM | POA: Diagnosis not present

## 2020-07-02 DIAGNOSIS — M47812 Spondylosis without myelopathy or radiculopathy, cervical region: Secondary | ICD-10-CM | POA: Diagnosis not present

## 2020-07-05 ENCOUNTER — Encounter: Payer: Self-pay | Admitting: Nurse Practitioner

## 2020-07-05 ENCOUNTER — Other Ambulatory Visit: Payer: Self-pay

## 2020-07-05 ENCOUNTER — Inpatient Hospital Stay: Payer: Medicare Other | Attending: Nurse Practitioner | Admitting: Nurse Practitioner

## 2020-07-05 ENCOUNTER — Telehealth: Payer: Self-pay | Admitting: Nurse Practitioner

## 2020-07-05 VITALS — BP 142/86 | HR 94 | Temp 97.3°F | Resp 17 | Ht 66.0 in | Wt 209.2 lb

## 2020-07-05 DIAGNOSIS — C210 Malignant neoplasm of anus, unspecified: Secondary | ICD-10-CM | POA: Diagnosis not present

## 2020-07-05 DIAGNOSIS — M5124 Other intervertebral disc displacement, thoracic region: Secondary | ICD-10-CM | POA: Diagnosis not present

## 2020-07-05 DIAGNOSIS — M5134 Other intervertebral disc degeneration, thoracic region: Secondary | ICD-10-CM | POA: Diagnosis not present

## 2020-07-05 DIAGNOSIS — J449 Chronic obstructive pulmonary disease, unspecified: Secondary | ICD-10-CM | POA: Insufficient documentation

## 2020-07-05 DIAGNOSIS — M5137 Other intervertebral disc degeneration, lumbosacral region: Secondary | ICD-10-CM | POA: Diagnosis not present

## 2020-07-05 DIAGNOSIS — F329 Major depressive disorder, single episode, unspecified: Secondary | ICD-10-CM | POA: Diagnosis not present

## 2020-07-05 DIAGNOSIS — E876 Hypokalemia: Secondary | ICD-10-CM | POA: Insufficient documentation

## 2020-07-05 DIAGNOSIS — E039 Hypothyroidism, unspecified: Secondary | ICD-10-CM | POA: Diagnosis not present

## 2020-07-05 DIAGNOSIS — D61818 Other pancytopenia: Secondary | ICD-10-CM | POA: Insufficient documentation

## 2020-07-05 DIAGNOSIS — Z8542 Personal history of malignant neoplasm of other parts of uterus: Secondary | ICD-10-CM | POA: Diagnosis not present

## 2020-07-05 DIAGNOSIS — M5136 Other intervertebral disc degeneration, lumbar region: Secondary | ICD-10-CM | POA: Diagnosis not present

## 2020-07-05 DIAGNOSIS — M4726 Other spondylosis with radiculopathy, lumbar region: Secondary | ICD-10-CM | POA: Diagnosis not present

## 2020-07-05 NOTE — Telephone Encounter (Signed)
No 7/14 los

## 2020-07-05 NOTE — Progress Notes (Signed)
Patient's daughter came out of exam room stating patient did not feel well and wanted to reschedule appointment. Patient had been seen by Ned Card, NP and was waiting to see Dr. Benay Spice. Asked patient if there was anything I could do to make her more comfortable, but patient declined and again stated she wanted to reschedule appointment. Ned Card, NP notified. Patient left via wheelchair with daughter.

## 2020-07-05 NOTE — Progress Notes (Signed)
[place] OFFICE PROGRESS NOTE   Diagnosis: Anal cancer  INTERVAL HISTORY:   Joanne Klein returns for follow-up.  She has noticed mild improvement in the leg weakness since beginning prednisone.  She took 60 mg of prednisone daily for the first week and is currently taking 50 mg daily.  Leg pain overall is better but she continues to note significant pain with light touch.  She has recently noticed large bruises at the low abdomen.  No other bleeding.  She is working with physical therapy.  She is able to walk with a walker.  Her daughter notices that she is no longer shuffling but able to pick her feet up off the ground.  Objective:  Vital signs in last 24 hours:  Blood pressure (!) 142/86, pulse 94, temperature (!) 97.3 F (36.3 C), temperature source Temporal, resp. rate 17, height 5\' 6"  (1.676 m), weight 209 lb 3.2 oz (94.9 kg), SpO2 97 %.    HEENT: No thrush or ulcers. Resp: Lungs clear bilaterally. Cardio: Regular rate and rhythm. GI: Abdomen soft and nontender. Vascular: No leg edema. Neuro: Bilateral leg weakness right greater than left.  Right foot drop. Skin: Large ecchymoses at the low abdomen.  Resolving ecchymosis left thigh region.   Lab Results:  Lab Results  Component Value Date   WBC 4.8 06/22/2020   HGB 12.1 06/22/2020   HCT 36.1 06/22/2020   MCV 97.0 06/22/2020   PLT 112 (L) 06/22/2020   NEUTROABS 3.6 06/19/2020    Imaging:  No results found.  Medications: I have reviewed the patient's current medications.  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 Biopsy 08/30/2019 confirmed well-differentiated squamous cell carcinoma with positive P 16 and p63 stains ? CTs 09/14/2019-abnormal soft tissue fullness at the lower anus/perianal soft tissues, asymmetric left inguinal lymphadenopathy, borderline enlarged left external iliac node, mild stranding and cutaneous thickening of the left gluteal fold, 2 to 3 mm pulmonary  nodules, 1.6 cm right hepatic lesion-likely a hemangioma ? PET scan 43/02/2950-[facility] hypermetabolic anal mass. 3 hypermetabolic left inguinal lymph nodes. Left external iliac node with minimally higher than blood pool activity. 1.0 x 0.8 cm left thyroid nodule with activity mildly above background blood pool activity but below liver activity. No perceptible hypermetabolic activity in the vicinity of the lateral right hepatic lobe lesion seen on the 09/14/2019 CT. ? Began concurrent chemoRT with 5FU/mitomycin on 10/03/2019 ? Cycle25-FU, dose reduced, mitomycin held 11/07/2019 ? Radiation completed 11/24/2019 ? CT abdomen/pelvis 12/27/2019-extensive left-sided colonic diverticulosis without diverticulitis. No bowel obstruction. No abscess in the abdomen or pelvis. Rectum borderline distended with stool without perirectal wall thickening or soft tissue stranding. Left inguinal and external iliac lymph nodes now subcentimeter compared to the previous size. Stable small lesion in the right lobe of the liver. No new liver lesions evident. 2. COPD 3. Fibromyalgia 4. Chronic back pain 5. Hypothyroid 6. Depression 7. History of uterine cancer-age 31 8. Pain secondary to #1 9. Orthostatic hypotension, fatigue, dyspnea on exertion, early mucositis requiring supportive care on day 9, secondary to chemotherapy  10. Thrombocytopenia PLT 58K and neutropenia ANC 1.4 on day 9, secondary to chemotherapy 11. Worsening cytopenias, PLT 8K and ANC 0.0 on day 15, secondary to chemotherapy. Started prophylaxis with cipro on 10/22 day 11 12. Hospital admission 10/17/2019 -fatigue, pancytopenia, hypokalemia 13.Left thyroid nodule with activity mildly above background blood pool activity butbelowliver activity on PET scan 09/27/2019.Thyroid ultrasound219 2021-1.6 cm nodule left inferior thyroid gland. 0/05/3015 FNA-benign follicular  nodule. 14.Tiny lung nodules noted on chest CT 09/14/2019 15. Falls,  back/leg pain and leg weakness-CT lumbar spine 05/26/2020 with no acute abnormality within the lumbar spine; multilevel degenerative spondylosis with resultant mild canal with bilateral lateral recess stenosis at L4-5; moderate bilateral L4 and L5 foraminal stenosis related to disc bulge, reactive endplate changes and facet degeneration; soft tissue density/stranding involving the presacral space anterior to the sacrum, incompletely visualized  MRI thoracic and lumbar spine 06/14/2020-no mass or fracture, degenerative changes in the thoracic and lumbar spine  MRI brain 06/21/2020-no acute abnormality  MRI cervical, thoracic and lumbar spine 06/21/2020-no abnormal cervical cord signal, mild cervical degenerative changes with high-grade stenosis, no significant abnormal enhancement in the thoracolumbar spine.  MRI pelvis 06/21/2020-no clearly defined mass or asymmetry in the anal/perianal region or adjacent rectum, mild but new presacral edema within the iliacus muscles, piriformis muscles, central gluteus muscles, and bilateral hip adductor musculature in a symmetric fashion. This is also accompanied by low-grade enhancement. This may reflect myositis, and association with prior regional radiation therapy is not excluded.  Trial of prednisone 60 mg daily 06/22/2020  Disposition: Joanne Klein continues to have significant bilateral leg weakness, right greater than left.  She feels there has been slight improvement since beginning prednisone about 2 weeks ago.  She began not feeling well while in the office and elected to leave prior to completing today's visit.  She will continue to taper prednisone as per instructions. We will schedule follow-up with [place] in about a month.  Dr. Benay Spice recommends she follow-up with Dr. Lisbeth Renshaw next week due to concern the symptoms she is experiencing are related to previous radiation. We made a referral.    Ned Card ANP/GNP-BC   07/05/2020  2:38 PM

## 2020-07-06 ENCOUNTER — Telehealth: Payer: Self-pay | Admitting: Nurse Practitioner

## 2020-07-06 NOTE — Telephone Encounter (Signed)
Scheduled per 7/15 los. Pt is aware of appt on 8/16.

## 2020-07-09 ENCOUNTER — Telehealth: Payer: Self-pay | Admitting: *Deleted

## 2020-07-09 DIAGNOSIS — M5137 Other intervertebral disc degeneration, lumbosacral region: Secondary | ICD-10-CM | POA: Diagnosis not present

## 2020-07-09 DIAGNOSIS — M5134 Other intervertebral disc degeneration, thoracic region: Secondary | ICD-10-CM | POA: Diagnosis not present

## 2020-07-09 DIAGNOSIS — M4726 Other spondylosis with radiculopathy, lumbar region: Secondary | ICD-10-CM | POA: Diagnosis not present

## 2020-07-09 DIAGNOSIS — M5124 Other intervertebral disc displacement, thoracic region: Secondary | ICD-10-CM | POA: Diagnosis not present

## 2020-07-09 DIAGNOSIS — M5136 Other intervertebral disc degeneration, lumbar region: Secondary | ICD-10-CM | POA: Diagnosis not present

## 2020-07-09 NOTE — Telephone Encounter (Signed)
LVM for patient for call back - Also LVM for her daughter for a call back.

## 2020-07-11 NOTE — Progress Notes (Signed)
Joanne Klein presents today to discuss her concerns with bilateral leg weakness, right greater than left.  She has had slight improvement since she began prednisone.  She continues to taper her prednisone.  She has about 3.5 weeks left on the taper.  She states the pain in her legs started soon after radiation ended.  The pain originated behind left knee, then both knees, legs, calves, then feet.  The left foot toes started to draw in when she moved it.  The outside of the calves are numb and when touched there is pain associated.  The left foot is "totally numb like its been shot up with novocaine".  No injury noted per patient.   MRI Pelvis 06/21/2020: No clearly defined mass or asymmetry in the anal/perianal region or adjacent rectum, mild but new presacral edemawithin the iliacus muscles, piriformis muscles, central gluteus muscles, and bilateral hip adductor musculature in a symmetric fashion. This is also accompanied by low-grade enhancement. This may reflect myositis, and association with prior regional radiation therapy is not excluded.  Pain: 6/10 pain in her bilateral legs, right greater than left.  Left foot is numb and it drags when she walks.  Ambulation: Ambulatory with walker.   Urinary frequency/urgency, nausea.

## 2020-07-12 ENCOUNTER — Ambulatory Visit
Admission: RE | Admit: 2020-07-12 | Discharge: 2020-07-12 | Disposition: A | Discharge status: Home / Self Care | Payer: Medicare Other | Source: Ambulatory Visit | Attending: Radiation Oncology | Admitting: Radiation Oncology

## 2020-07-12 ENCOUNTER — Telehealth: Payer: Medicare Other

## 2020-07-12 ENCOUNTER — Encounter: Payer: Self-pay | Admitting: Radiation Oncology

## 2020-07-12 VITALS — Ht 66.0 in | Wt 209.0 lb

## 2020-07-12 DIAGNOSIS — R29898 Other symptoms and signs involving the musculoskeletal system: Secondary | ICD-10-CM | POA: Diagnosis not present

## 2020-07-12 DIAGNOSIS — C210 Malignant neoplasm of anus, unspecified: Secondary | ICD-10-CM

## 2020-07-12 DIAGNOSIS — R200 Anesthesia of skin: Secondary | ICD-10-CM

## 2020-07-12 DIAGNOSIS — Z7952 Long term (current) use of systemic steroids: Secondary | ICD-10-CM | POA: Diagnosis not present

## 2020-07-12 DIAGNOSIS — R202 Paresthesia of skin: Secondary | ICD-10-CM | POA: Diagnosis not present

## 2020-07-12 DIAGNOSIS — Z85048 Personal history of other malignant neoplasm of rectum, rectosigmoid junction, and anus: Secondary | ICD-10-CM | POA: Diagnosis not present

## 2020-07-12 DIAGNOSIS — Z923 Personal history of irradiation: Secondary | ICD-10-CM | POA: Diagnosis not present

## 2020-07-12 NOTE — Progress Notes (Signed)
Radiation Oncology         [phone] ________________________________  Follow Up - Conducted via telephone due to current COVID-19 concerns for limiting patient exposure  I spoke with the patient to conduct this consult visit via telephone to spare the patient unnecessary potential exposure in the healthcare setting during the current COVID-19 pandemic. The patient was notified in advance and was offered a [facility] [place] meeting to allow for face to face communication but unfortunately reported that they did not have the appropriate resources/technology to support such a visit and instead preferred to proceed with a telephone visit. ________________________________  Name: Joanne Klein        MRN: [id number]  Date of Service: 07/12/2020 DOB: 08-08-1946  GY:[id number], Real Cons, MD  Ladell Pier, MD     REFERRING PHYSICIAN: Ladell Pier, MD   DIAGNOSIS: The primary encounter diagnosis was Anal cancer [facility]). Diagnoses of Numbness and tingling and Weakness of both legs were also pertinent to this visit.   HISTORY OF PRESENT ILLNESS: Joanne Klein is a 74 y.o. female with a history of squamous cell carcinoma of the anus.  She was originally noted to have a mass at the anal verge and a biopsy on 08/30/2019 revealed a squamous cell carcinoma. She was treated with chemoRT and did well during treatment with anticipated side effects of desquamation in areas being treated. She has been without disease since, and continues to follow with Dr. Marcello Moores and Dr. Benay Spice. She has noticed difficulty in the last few months that has progressed with lower extremity numbness and weakness, as well as significant pain. She has undergone workup with plain films CTs and MRIs. She was seen in the ED and xray films of her right knee on 05/26/20 revealed a small cortical avulsion fracture in the medial margin of the medial femoral condyle, and the left sided xrays were negative for any acute findings. There was also a  minimally displaced oblique fracture of the lateral malleolus with overlying soft tissue swelling. CT that day as well of the lumbar spine revealed degenerative changes with canal stenosis at L4-5, and stenosis resulting at that level from disc bulge. There was also nonspecific stranding/soft tissue density in the presacral space anterior to the sacrum. She returned to the ED on 06/19/20 and work up included MRI of the thoracic and lumbar spine without contrast that revealed no acute findings or fracture or mass, but confirmed degenerative changes in the T11-12, T12-L1, and L4-5 levels. Mild subarticular stenosis was seen on the left at L4-5. She has continued having symptoms and was seen by Dr. Benay Spice.  He recommended further evaluation with inpatient setting on 06/19/2020. She was admitted, and an MRI of the brain with and without contrast was negative for acute findings, atrophy and mild to moderate chronic microvascular ischemic change in the white matter and pons was identified and chronic microhemorrhage in the right posterior frontal white matter.  MRI with and without contrast of the cervical spine as well as MRI of the thoracic and lumbar spine vault with contrast revealed no abnormal cervical cord signal, mild cervical degenerative changes without high-grade stenosis and no significant enhancement in the thoracic or lumbar spine. MRI of the pelvis with and without contrast was also performed on 06/21/2020 which revealed no clearly identified mass or asymmetry in the anal or perianal region or adjacent rectum.  There is mild but new presacral edema along the iliacus muscles, piriformis muscles, central gluteus muscles, obturator internus muscles, and  bilateral hip adductor muscles that are in symmetric fashion also accompanying low-grade enhancement in the same structures that are proportional to the degree of edema no abscess or masslike lesions appreciated there is accentuated T1 signal in the sacrum and  adjacent portion of the iliac bones as well as the pubic rami.  No signal or enhancement of the sciatic nerves were identified.  She is being treated for possible myositis with oral prednisone taper. She started at 60 mg per day, and each week has decreased her dose for that week by 10 mg. She is currently taking 40 mg since 07/08/20. She is contacted today to follow up on her condition.      PREVIOUS RADIATION THERAPY:   10/03/2019-11/24/2019: The anus and regional nodes were treated to 54 Gy in 30 fractions   PAST MEDICAL HISTORY:  Past Medical History:  Diagnosis Date  . Allergic rhinitis   . Arthritis   . Bursitis    Both Shoulders  . Chronic low back pain   . Degenerative disk disease   . Depression   . Diverticulitis   . Emphysema of lung ([place])   . Fibromyalgia   . GERD (gastroesophageal reflux disease)   . History of fibromyalgia   . Hypertension   . Hypothyroidism   . Irritable bowel syndrome   . Neuralgia, post-herpetic   . Obesity, unspecified   . Obstructive sleep apnea   . OCD (obsessive compulsive disorder)   . Pain in joint, site unspecified   . Pure hypercholesterolemia   . Scoliosis   . Situational stress   . Spastic colon   . Tubular adenoma   . Uterine cancer ([place])        PAST SURGICAL HISTORY: Past Surgical History:  Procedure Laterality Date  . BREAST LUMPECTOMY     knot removed  . CHOLECYSTECTOMY    . HYSTEROTOMY    . RADIOLOGY WITH ANESTHESIA Bilateral 06/14/2020   Procedure: MRI WITH ANESTHESIA;  Surgeon: Radiologist, Medication, MD;  Location: [facility];  Service: Radiology;  Laterality: Bilateral;  . RADIOLOGY WITH ANESTHESIA N/A 06/21/2020   Procedure: MRI WITH ANESTHESIA;  Surgeon: Radiologist, Medication, MD;  Location: [facility];  Service: Radiology;  Laterality: N/A;  . TONSILLECTOMY    . tumor in left forearm       FAMILY HISTORY:  Family History  Problem Relation Age of Onset  . Stomach cancer Father   . Liver cancer Father   .  Diabetes Father   . Colon cancer Neg Hx      SOCIAL HISTORY:  reports that she quit smoking about 23 years ago. Her smoking use included cigarettes. She has a 68.00 pack-year smoking history. She has never used smokeless tobacco. She reports previous alcohol use. She reports that she does not use drugs. The patient is divorced and lives in [place].    ALLERGIES: Prozac [fluoxetine hcl], Codeine, Morphine and related, and Sulfa antibiotics   MEDICATIONS:  Current Outpatient Medications  Medication Sig Dispense Refill  . atorvastatin (LIPITOR) 20 MG tablet TAKE 1 TABLET BY MOUTH  EVERY OTHER DAY (Patient taking differently: Take 20 mg by mouth 4 (four) times a week. Saturday, Sunday, Tuesday, and Thursday) 45 tablet 3  . calcium carbonate (TUMS - DOSED IN MG ELEMENTAL CALCIUM) 500 MG chewable tablet Chew 1 tablet by mouth daily as needed.     . Cholecalciferol (VITAMIN D-3) 1000 UNITS CAPS Take 1,000 Units by mouth daily.     Marland Kitchen levothyroxine (SYNTHROID) 75 MCG tablet  TAKE 1 TABLET BY MOUTH  DAILY BEFORE BREAKFAST (Patient taking differently: Take 75 mcg by mouth daily before breakfast. ) 90 tablet 3  . omeprazole (PRILOSEC) 40 MG capsule TAKE 1 CAPSULE BY MOUTH  DAILY (Patient taking differently: Take 40 mg by mouth daily. ) 90 capsule 3  . ondansetron (ZOFRAN) 8 MG tablet Take 8 mg by mouth every 8 (eight) hours as needed for nausea or vomiting.    Marland Kitchen oxyCODONE-acetaminophen (PERCOCET) 10-325 MG tablet Take 1 tablet by mouth every 4 (four) hours. Gets from another provider @@ 180 per month     . phenylephrine-shark liver oil-mineral oil-petrolatum (PREPARATION H) 0.25-3-14-71.9 % rectal ointment Place 1 application rectally 2 (two) times daily as needed for hemorrhoids.    . Polyethylene Glycol 3350 (MIRALAX PO) Take 17 g by mouth at bedtime.     . potassium chloride SA (KLOR-CON) 20 MEQ tablet Take 1 tablet (20 mEq total) by mouth daily. 30 tablet 2  . predniSONE (DELTASONE) 10 MG tablet  Prednisone 60 mg daily( 6 tablets) daily for 1 week Then 50 mg daily(5 tablets) daily for the next week Then 40 mg daily 4 tablets daily for the following week Then 30 mg daily 3 tablets daily for the following week Then 20 mg daily that is 2 tablets daily for the following week Then 10 mg daily for 1 week or till all tablets are done. 147 tablet 0  . prochlorperazine (COMPAZINE) 10 MG tablet Take 10 mg by mouth every 6 (six) hours as needed for nausea or vomiting.    . sertraline (ZOLOFT) 100 MG tablet TAKE 2 TABLETS BY MOUTH AT  BEDTIME (Patient taking differently: Take 200 mg by mouth every evening. ) 180 tablet 3  . senna-docusate (SENOKOT-S) 8.6-50 MG tablet Take 1 tablet by mouth at bedtime as needed for mild constipation. (Patient not taking: Reported on 07/05/2020)     No current facility-administered medications for this encounter.     REVIEW OF SYSTEMS: On review of systems, the patient reports she has been having significant symptoms, though since starting prednisone the intensity of her symptoms of pain has improved greatly, though she still has numbness. She describes pain in her lower back that is similar to a pulling feeling, and she has low back pain that predates her cancer diagnosis. She reports she has had numbness in the lower extremities equally, but pain that is more noticeable in the RLE than the left. She states that her left foot is so numb that when she walks, she has to drag her left foot and that her toes curl up and are difficult to relax. She reports that when she takes a step with her right foot, she has a hard time lifting the foot off the ground more than an inch. She reports that she has control over her bladder though describes urgency with urination. She reports she has no control of her bowel function since chemoRT. She denies any saddle anesthesia, but states she is most painful in the lower extremities at the level of the calves and that they are both just very  painful to light touch. No other complaints are noted.     PHYSICAL EXAM:  Unable to assess due to encounter type.  ECOG = 3-4  0 - Asymptomatic (Fully active, able to carry on all predisease activities without restriction)  1 - Symptomatic but completely ambulatory (Restricted in physically strenuous activity but ambulatory and able to carry out work of a light or  sedentary nature. For example, light housework, office work)  2 - Symptomatic, <50% in bed during the day (Ambulatory and capable of all self care but unable to carry out any work activities. Up and about more than 50% of waking hours)  3 - Symptomatic, >50% in bed, but not bedbound (Capable of only limited self-care, confined to bed or chair 50% or more of waking hours)  4 - Bedbound (Completely disabled. Cannot carry on any self-care. Totally confined to bed or chair)  5 - Death   Eustace Pen MM, Creech RH, Tormey DC, et al. [phone]). "Toxicity and response criteria of the [facility]". [place] Oncol. 5 (6): 649-55    LABORATORY DATA:  Lab Results  Component Value Date   WBC 4.8 06/22/2020   HGB 12.1 06/22/2020   HCT 36.1 06/22/2020   MCV 97.0 06/22/2020   PLT 112 (L) 06/22/2020   Lab Results  Component Value Date   NA 138 06/22/2020   K 3.9 06/22/2020   CL 103 06/22/2020   CO2 27 06/22/2020   Lab Results  Component Value Date   ALT 13 06/22/2020   AST 18 06/22/2020   ALKPHOS 72 06/22/2020   BILITOT 1.7 (H) 06/22/2020      RADIOGRAPHY: MR BRAIN W WO CONTRAST  Result Date: 06/21/2020 CLINICAL DATA:  Subacute neuro deficit EXAM: MRI HEAD WITHOUT AND WITH CONTRAST TECHNIQUE: Multiplanar, multiecho pulse sequences of the brain and surrounding structures were obtained without and with intravenous contrast. CONTRAST:  9.57mL GADAVIST GADOBUTROL 1 MMOL/ML IV SOLN COMPARISON:  None. FINDINGS: Brain: Mild atrophy. Negative for hydrocephalus. Patchy white matter hyperintensity bilaterally. Patchy  hyperintensity in the pons bilaterally. Negative for acute infarct or mass. Chronic microhemorrhage right frontal white matter. No enhancing lesion identified postcontrast. Vascular: Normal arterial flow voids. Skull and upper cervical spine: No focal skeletal lesion. Sinuses/Orbits: Mild mucosal edema paranasal sinuses. Negative orbit. Other: None IMPRESSION: No acute abnormality Atrophy and mild to moderate chronic microvascular ischemic change in the white matter and pons. Chronic microhemorrhage right posterior frontal white matter. Electronically Signed   By: Franchot Gallo M.D.   On: 06/21/2020 15:26   MR THORACIC SPINE WO CONTRAST  Result Date: 06/14/2020 CLINICAL DATA:  Progressive lower extremity weakness. Back pain. History of anal cancer and uterine cancer MRI performed with assistance of anesthesiology for claustrophobia. EXAM: MRI THORACIC AND LUMBAR SPINE WITHOUT CONTRAST TECHNIQUE: Multiplanar and multiecho pulse sequences of the thoracic and lumbar spine were obtained without intravenous contrast. COMPARISON:  CT lumbar spine 05/26/2020 FINDINGS: MRI THORACIC SPINE FINDINGS Alignment:  Normal.  Mild thoracic kyphosis. Vertebrae: Negative for fracture or mass.  No bone marrow edema. Cord:  Normal signal and morphology.  No cord compression. Paraspinal and other soft tissues: Common bile duct 11.5 mm. Intrahepatic ductal dilatation. This is likely due to postop cholecystectomy. Correlate with liver function test. Disc levels: Small right-sided disc protrusion T11-12 without significant spinal or foraminal stenosis. Disc degeneration with endplate spurring at J19-J4 without stenosis. Otherwise no significant degenerative change in the thoracic spine. MRI LUMBAR SPINE FINDINGS Segmentation:  Normal Alignment:  Normal Vertebrae:  Negative for fracture or mass.  No bone marrow edema. Fatty changes in the bone marrow at L5 and the sacrum compatible with prior pelvic radiation. Conus medullaris and cauda  equina: Conus extends to the T12-L1 level. Conus and cauda equina appear normal. Paraspinal and other soft tissues: Mild biliary dilatation as described above. No paraspinous mass or adenopathy. Disc levels: L1-2:  Disc bulging and mild endplate spurring. No significant stenosis. L2-3: Mild disc bulging and mild facet degeneration. Negative for stenosis L3-4: Diffuse bulging of the disc with shallow central disc protrusion. Mild facet hypertrophy. Negative for spinal or foraminal stenosis. L4-5: Disc degeneration with disc bulging and mild spurring. Mild facet degeneration. Mild to moderate left subarticular stenosis. Spinal canal mildly narrowed without significant stenosis. L5-S1: Mild disc degeneration and disc bulging. Negative for neural impingement or stenosis. Mild facet degeneration bilaterally. IMPRESSION: MR THORACIC SPINE IMPRESSION Disc degeneration at T11-T12 and T12-L1. Small right-sided disc protrusion at T11-12. No significant stenosis. Negative for lumbar fracture. Biliary dilatation likely related to cholecystectomy. Correlate with liver function test. MR LUMBAR SPINE IMPRESSION Multilevel mild degenerative changes in the lumbar spine similar to the recent CT. Mild to moderate subarticular stenosis on the left at L4-5. Negative for fracture or mass. Electronically Signed   By: Franchot Gallo M.D.   On: 06/14/2020 18:27   MR THORACIC SPINE W CONTRAST  Result Date: 06/21/2020 CLINICAL DATA:  Anal cancer post radiation and chemotherapy, lower extremity weakness, numbness and tingling with progressive worsening; multiple falls since prior noncontrast study EXAM: MRI CERVICAL WITHOUT AND WITH CONTRAST THORACIC AND LUMBAR SPINE WITH CONTRAST TECHNIQUE: Multiplanar and multiecho pulse sequences of the cervical spine, to include the craniocervical junction and cervicothoracic junction, and thoracic and lumbar spine, were obtained without and with intravenous contrast. Multiplanar multi-echo sequences of  the thoracic and lumbar spine were obtained with intravenous contrast. CONTRAST:  9.9 mL Gadavist COMPARISON:  Noncontrast studies 06/14/2020, 03/08/2019 FINDINGS: MRI CERVICAL SPINE Alignment: Stable. Vertebrae: Stable vertebral body heights. No new marrow edema suspicious osseous lesion. Cord: No abnormal signal.  No abnormal intrathecal enhancement. Posterior Fossa, vertebral arteries, paraspinal tissues: Unremarkable. Disc levels: Degenerative changes primarily from C4-C5 to C6-C7 are stable in appearance including small left central disc protrusion at C5-C6. There is stable left greater than right foraminal stenosis at this level. MRI THORACIC SPINE Alignment:  Stable Vertebrae: Stable vertebral body heights. There is minor degenerative marrow enhancement. No suspicious osseous lesion. Cord:  No abnormal intrathecal enhancement Paraspinal and other soft tissues: Unremarkable. Disc levels: Multilevel degenerative changes better evaluated on prior study. MRI LUMBAR SPINE Alignment:  Stable. Vertebrae: Stable vertebral body heights. Minor degenerative marrow enhancement. No suspicious osseous lesion. Conus medullaris and cauda equina: No abnormal intrathecal enhancement. Conus and cauda equina appear normal. Paraspinal and other soft tissues: Unremarkable. Disc levels: Multilevel degenerative changes better evaluated prior study. IMPRESSION: No abnormal cervical cord signal. Mild cervical degenerative changes without high-grade stenosis. No significant abnormal enhancement in the thoracolumbar spine. Electronically Signed   By: Macy Mis M.D.   On: 06/21/2020 15:24   MR LUMBAR SPINE WO CONTRAST  Result Date: 06/14/2020 CLINICAL DATA:  Progressive lower extremity weakness. Back pain. History of anal cancer and uterine cancer MRI performed with assistance of anesthesiology for claustrophobia. EXAM: MRI THORACIC AND LUMBAR SPINE WITHOUT CONTRAST TECHNIQUE: Multiplanar and multiecho pulse sequences of the  thoracic and lumbar spine were obtained without intravenous contrast. COMPARISON:  CT lumbar spine 05/26/2020 FINDINGS: MRI THORACIC SPINE FINDINGS Alignment:  Normal.  Mild thoracic kyphosis. Vertebrae: Negative for fracture or mass.  No bone marrow edema. Cord:  Normal signal and morphology.  No cord compression. Paraspinal and other soft tissues: Common bile duct 11.5 mm. Intrahepatic ductal dilatation. This is likely due to postop cholecystectomy. Correlate with liver function test. Disc levels: Small right-sided disc protrusion T11-12 without significant spinal or foraminal stenosis. Disc  degeneration with endplate spurring at Z36-U4 without stenosis. Otherwise no significant degenerative change in the thoracic spine. MRI LUMBAR SPINE FINDINGS Segmentation:  Normal Alignment:  Normal Vertebrae:  Negative for fracture or mass.  No bone marrow edema. Fatty changes in the bone marrow at L5 and the sacrum compatible with prior pelvic radiation. Conus medullaris and cauda equina: Conus extends to the T12-L1 level. Conus and cauda equina appear normal. Paraspinal and other soft tissues: Mild biliary dilatation as described above. No paraspinous mass or adenopathy. Disc levels: L1-2: Disc bulging and mild endplate spurring. No significant stenosis. L2-3: Mild disc bulging and mild facet degeneration. Negative for stenosis L3-4: Diffuse bulging of the disc with shallow central disc protrusion. Mild facet hypertrophy. Negative for spinal or foraminal stenosis. L4-5: Disc degeneration with disc bulging and mild spurring. Mild facet degeneration. Mild to moderate left subarticular stenosis. Spinal canal mildly narrowed without significant stenosis. L5-S1: Mild disc degeneration and disc bulging. Negative for neural impingement or stenosis. Mild facet degeneration bilaterally. IMPRESSION: MR THORACIC SPINE IMPRESSION Disc degeneration at T11-T12 and T12-L1. Small right-sided disc protrusion at T11-12. No significant  stenosis. Negative for lumbar fracture. Biliary dilatation likely related to cholecystectomy. Correlate with liver function test. MR LUMBAR SPINE IMPRESSION Multilevel mild degenerative changes in the lumbar spine similar to the recent CT. Mild to moderate subarticular stenosis on the left at L4-5. Negative for fracture or mass. Electronically Signed   By: Franchot Gallo M.D.   On: 06/14/2020 18:27   MR LUMBAR SPINE W CONTRAST  Result Date: 06/21/2020 CLINICAL DATA:  Anal cancer post radiation and chemotherapy, lower extremity weakness, numbness and tingling with progressive worsening; multiple falls since prior noncontrast study EXAM: MRI CERVICAL WITHOUT AND WITH CONTRAST THORACIC AND LUMBAR SPINE WITH CONTRAST TECHNIQUE: Multiplanar and multiecho pulse sequences of the cervical spine, to include the craniocervical junction and cervicothoracic junction, and thoracic and lumbar spine, were obtained without and with intravenous contrast. Multiplanar multi-echo sequences of the thoracic and lumbar spine were obtained with intravenous contrast. CONTRAST:  9.9 mL Gadavist COMPARISON:  Noncontrast studies 06/14/2020, 03/08/2019 FINDINGS: MRI CERVICAL SPINE Alignment: Stable. Vertebrae: Stable vertebral body heights. No new marrow edema suspicious osseous lesion. Cord: No abnormal signal.  No abnormal intrathecal enhancement. Posterior Fossa, vertebral arteries, paraspinal tissues: Unremarkable. Disc levels: Degenerative changes primarily from C4-C5 to C6-C7 are stable in appearance including small left central disc protrusion at C5-C6. There is stable left greater than right foraminal stenosis at this level. MRI THORACIC SPINE Alignment:  Stable Vertebrae: Stable vertebral body heights. There is minor degenerative marrow enhancement. No suspicious osseous lesion. Cord:  No abnormal intrathecal enhancement Paraspinal and other soft tissues: Unremarkable. Disc levels: Multilevel degenerative changes better evaluated on  prior study. MRI LUMBAR SPINE Alignment:  Stable. Vertebrae: Stable vertebral body heights. Minor degenerative marrow enhancement. No suspicious osseous lesion. Conus medullaris and cauda equina: No abnormal intrathecal enhancement. Conus and cauda equina appear normal. Paraspinal and other soft tissues: Unremarkable. Disc levels: Multilevel degenerative changes better evaluated prior study. IMPRESSION: No abnormal cervical cord signal. Mild cervical degenerative changes without high-grade stenosis. No significant abnormal enhancement in the thoracolumbar spine. Electronically Signed   By: Macy Mis M.D.   On: 06/21/2020 15:24   MR CERVICAL SPINE W WO CONTRAST  Result Date: 06/21/2020 CLINICAL DATA:  Anal cancer post radiation and chemotherapy, lower extremity weakness, numbness and tingling with progressive worsening; multiple falls since prior noncontrast study EXAM: MRI CERVICAL WITHOUT AND WITH CONTRAST THORACIC AND LUMBAR SPINE  WITH CONTRAST TECHNIQUE: Multiplanar and multiecho pulse sequences of the cervical spine, to include the craniocervical junction and cervicothoracic junction, and thoracic and lumbar spine, were obtained without and with intravenous contrast. Multiplanar multi-echo sequences of the thoracic and lumbar spine were obtained with intravenous contrast. CONTRAST:  9.9 mL Gadavist COMPARISON:  Noncontrast studies 06/14/2020, 03/08/2019 FINDINGS: MRI CERVICAL SPINE Alignment: Stable. Vertebrae: Stable vertebral body heights. No new marrow edema suspicious osseous lesion. Cord: No abnormal signal.  No abnormal intrathecal enhancement. Posterior Fossa, vertebral arteries, paraspinal tissues: Unremarkable. Disc levels: Degenerative changes primarily from C4-C5 to C6-C7 are stable in appearance including small left central disc protrusion at C5-C6. There is stable left greater than right foraminal stenosis at this level. MRI THORACIC SPINE Alignment:  Stable Vertebrae: Stable vertebral body  heights. There is minor degenerative marrow enhancement. No suspicious osseous lesion. Cord:  No abnormal intrathecal enhancement Paraspinal and other soft tissues: Unremarkable. Disc levels: Multilevel degenerative changes better evaluated on prior study. MRI LUMBAR SPINE Alignment:  Stable. Vertebrae: Stable vertebral body heights. Minor degenerative marrow enhancement. No suspicious osseous lesion. Conus medullaris and cauda equina: No abnormal intrathecal enhancement. Conus and cauda equina appear normal. Paraspinal and other soft tissues: Unremarkable. Disc levels: Multilevel degenerative changes better evaluated prior study. IMPRESSION: No abnormal cervical cord signal. Mild cervical degenerative changes without high-grade stenosis. No significant abnormal enhancement in the thoracolumbar spine. Electronically Signed   By: Macy Mis M.D.   On: 06/21/2020 15:24   MR PELVIS W WO CONTRAST  Result Date: 06/21/2020 CLINICAL DATA:  Anal cancer with prior radiation therapy and chemotherapy common a numbness and tingling in the lower extremities with weakness. EXAM: MRI PELVIS WITHOUT AND WITH CONTRAST TECHNIQUE: Multiplanar multisequence MR imaging of the pelvis was performed both before and after administration of intravenous contrast. CONTRAST:  9.17mL GADAVIST GADOBUTROL 1 MMOL/ML IV SOLN COMPARISON:  CT pelvis 12/27/2019 FINDINGS: Urinary Tract:  Unremarkable Bowel: Extensive sigmoid colon diverticulosis, no active diverticulitis identified. No clearly defined mass or asymmetry in the anal/perianal region or in the adjacent rectum. Please note that today's exam was not performed using the rectal protocol. Vascular/Lymphatic: Common iliac atherosclerosis. No pathologic adenopathy identified. Reproductive:  Uterus absent.  Adnexa unremarkable. Other:  No supplemental non-categorized findings. Musculoskeletal: There is mild presacral edema along with edema tracking along and within the iliacus muscles,  piriformis muscles, central gluteus muscles, obturator internus muscles, and bilateral hip adductor musculature in a symmetric fashion. This edema is also accompanied by low-grade enhancement in the same structures proportionate the to the degree of edema. No drainable abscess or masslike lesion is appreciated. Given the bilateral symmetry this may well represent myositis, possibly related to prior radiation therapy. However, the degree of presacral edema is increased compared to 12/27/2019 and by report the patient has prior radiation therapy was completed in December 2020. There is accentuated T1 signal in the sacrum and adjacent portion of the iliac bones as well as the pubic rami, compatible with prior radiation therapy. No mass lesion is identified along the sacral plexus, sciatic notch, or sciatic nerves. No significant asymmetry or accentuation of T2 signal or enhancement in the sciatic nerves to specifically indicate sciatic neuropathy. IMPRESSION: 1. No clearly defined mass or asymmetry in the anal/perianal region or adjacent rectum. 2. There is mild but new presacral edema along with edema tracking along and within the iliacus muscles, piriformis muscles, central gluteus muscles, and bilateral hip adductor musculature in a symmetric fashion. This is also accompanied by low-grade enhancement.  This may reflect myositis, and association with prior regional radiation therapy is not excluded. 3. Extensive sigmoid colon diverticulosis, no active diverticulitis identified. 4. Common iliac atherosclerosis. Electronically Signed   By: Van Clines M.D.   On: 06/21/2020 17:57       IMPRESSION/PLAN: 1. 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 Dr. Lisbeth Renshaw has reviewed the patient's prior imaging and work up. This is quite a complex presentation with multiple issues ongoing including fractures, degenerative changes of the spine, and inflammatory findings in the musculature of the  pelvis. The question that has been raised is whether or not the inflammatory changes are a result of radiotherapy. It is unclear as this is not a common long term problem with the regimen she received. Dr. Lisbeth Renshaw has compared the findings of edema on the MRI with her previous treatment plan and did not identify increased areas of dose in the treatment plan at the sites seen on the scan. While it is difficult to be confident that this is or is not a result of prior radiotherapy, Dr. Lisbeth Renshaw recommends symptomatic management and agrees with the steroids that have been given. He reviews with the patient that he has performed literature searches and found a reported case with similar clinical symptoms in a patient who responded well to a slow steroid taper. He would recommend continuing the scheme for her steroid dosing, but slower taper. She will continue the prednisone at 40 mg daily until 07/22/20. We will help her with the taper and drop the dose by 10 mg every 2 weeks, and once she gets to 10 mg/day, slowly taper over the next few months. She is in agreement with this plan and will also continue working with physical therapy at home. We will check on her in the next week. 2. Squamous Cell Carcinoma of the Anus. The patient has been clinically and radiographically without disease. She will follow up with Dr. Benay Spice and Dr. Marcello Moores as well for surveillance.  3. GYN Care. The patient has had hysterectomy. She will need to continue with post radiotherapy GYN care with [place] once she is more functional.  Given current concerns for patient exposure during the COVID-19 pandemic, this encounter was conducted via telephone.  The patient has given verbal consent for this type of encounter. The time spent during this encounter was 45 minutes and 50% of that time was spent in the coordination of her care. The attendants for this meeting include Dr. Lisbeth Renshaw, Blenda Nicely, RN,  Shona Simpson, [facility] and Vella Raring  During the  encounter, Dr. Lisbeth Renshaw, Blenda Nicely, RN, and Shona Simpson [facility] were located at [facility] Radiation Oncology Department.  Selinda Orion Chism  was located at home.  The above documentation reflects my direct findings during this shared patient visit. Please see the separate note by Dr. Lisbeth Renshaw on this date for the remainder of the patient's plan of care.    Carola Rhine, PAC

## 2020-07-12 NOTE — Chronic Care Management (AMB) (Deleted)
Chronic Care Management Pharmacy  Name: DONNAMARIA SHANDS  MRN: [id number] DOB: 02-15-1946   Chief Complaint/ HPI  Joanne Klein,  74 y.o. , female presents for their Follow-Up CCM visit with the clinical pharmacist via telephone due to COVID-19 Pandemic.  PCP : Hoyt Koch, MD  Their chronic conditions include:Hyperlipidemia, GERD, COPD, Depression, Osteoarthritis and OCD, fibromyalgia, orthostatic hypotension   Office Visits: 01/11/20 Dr Sharlet Salina OV: bowels improved, hydration improved. Use O2 prn for SOB and at night. Hand pain not improved w/ brace, Checked x-ray for pinched nerve, referred to neurology.  12/15/19 Dr Sharlet Salina OV: numbness in L hand. Recommended daily docusate due to chronic opioids - want to lessen imodium and miralax usage. Rec'd hand brace for hand pain.  Consult Visit: 07/05/20 NP Ned Card (heme/onc): pt feeling slightly better on prednisone. Pt had to leave early due to not feeling well. No med changes.  06/19/20 - 06/23/20 Hospital admission: MRI spine no acute changes; MRI brain chronic small vessel ischemic changes; MRI pelvis new edema around hip/gluteal areas, may reflect myositis, possibly related to previous radiation. Discharged on prednisone taper.  06/19/20 Dr Benay Spice (oncology): MRIs nondiagnostic, rec'd hospital admission for neuro consult, lumbar puncture  06/13/20 ED visit: bilateral leg weakness, came to ED for MRI since sx were worsening. K very low 2.9, pt declined IV d/t PO KCl at home. Ordered home health and PT, however pt declined. Pt also declined discussing plan with daughter.  06/11/20 NP Ned Card (heme/onc): referred for MRI after multiple falls. Also restart daily potassum due to low K+  06/04/20 PA Carlyon Shadow [facility] ortho): given brace for ankle/knee fracture.  05/26/20 ED visit: 2 recent falls in previous 2 weeks. R ankle and knee fracture. F/U with orthopedic surgery.  05/09/20 NP Ned Card (heme/onc): in remission.  Mild/moderate thrombocytopenia. RTC 4 months for surveillance CT scan.  [facility]: PT monthly. 01/10/20 NP Marcello Moores (heme/onc): SCC anal dx 08/2019. Thyroid nodule on PET scan, referred for thyroid US.  12/27/19 ED visit: abdominal pain/constipation. Found UTI, Rx'd Keflex. 12/13/19 Dr Benay Spice (oncology): finished radiation 3 wks prior. C/o dizziness, dyspnea. Rec'd increase fluid intake and supplements - Mg and KCl.    Allergies  Allergen Reactions   Prozac [Fluoxetine Hcl] Anaphylaxis   Codeine Itching   Morphine And Related Itching   Sulfa Antibiotics Itching and Nausea Only    Medications: Outpatient Encounter Medications as of 07/12/2020  Medication Sig   atorvastatin (LIPITOR) 20 MG tablet TAKE 1 TABLET BY MOUTH  EVERY OTHER DAY (Patient taking differently: Take 20 mg by mouth 4 (four) times a week. Saturday, Sunday, Tuesday, and Thursday)   calcium carbonate (TUMS - DOSED IN MG ELEMENTAL CALCIUM) 500 MG chewable tablet Chew 1 tablet by mouth daily as needed.    Cholecalciferol (VITAMIN D-3) 1000 UNITS CAPS Take 1,000 Units by mouth daily.    levothyroxine (SYNTHROID) 75 MCG tablet TAKE 1 TABLET BY MOUTH  DAILY BEFORE BREAKFAST (Patient taking differently: Take 75 mcg by mouth daily before breakfast. )   omeprazole (PRILOSEC) 40 MG capsule TAKE 1 CAPSULE BY MOUTH  DAILY (Patient taking differently: Take 40 mg by mouth daily. )   ondansetron (ZOFRAN) 8 MG tablet Take 8 mg by mouth every 8 (eight) hours as needed for nausea or vomiting.   oxyCODONE-acetaminophen (PERCOCET) 10-325 MG tablet Take 1 tablet by mouth every 4 (four) hours. Gets from another provider @@ 180 per month    phenylephrine-shark liver oil-mineral oil-petrolatum (PREPARATION  H) 0.25-3-14-71.9 % rectal ointment Place 1 application rectally 2 (two) times daily as needed for hemorrhoids.   Polyethylene Glycol 3350 (MIRALAX PO) Take 17 g by mouth at bedtime.    potassium chloride SA (KLOR-CON)  20 MEQ tablet Take 1 tablet (20 mEq total) by mouth daily.   predniSONE (DELTASONE) 10 MG tablet Prednisone 60 mg daily( 6 tablets) daily for 1 week Then 50 mg daily(5 tablets) daily for the next week Then 40 mg daily 4 tablets daily for the following week Then 30 mg daily 3 tablets daily for the following week Then 20 mg daily that is 2 tablets daily for the following week Then 10 mg daily for 1 week or till all tablets are done.   prochlorperazine (COMPAZINE) 10 MG tablet Take 10 mg by mouth every 6 (six) hours as needed for nausea or vomiting.   senna-docusate (SENOKOT-S) 8.6-50 MG tablet Take 1 tablet by mouth at bedtime as needed for mild constipation. (Patient not taking: Reported on 07/05/2020)   sertraline (ZOLOFT) 100 MG tablet TAKE 2 TABLETS BY MOUTH AT  BEDTIME (Patient taking differently: Take 200 mg by mouth every evening. )   No facility-administered encounter medications on file as of 07/12/2020.     Current Diagnosis/Assessment:    Goals Addressed   None     Hyperlipidemia   LDL goal < 100  Lipid Panel      Component Value Date/Time   CHOL 197 02/17/2019 1637   TRIG 138.0 02/17/2019 1637   HDL 50.10 02/17/2019 1637   CHOLHDL 4 02/17/2019 1637   VLDL 27.6 02/17/2019 1637   LDLCALC 119 (H) 02/17/2019 1637    The 10-year ASCVD risk score Mikey Bussing DC Jr., et al., 2013) is: 14.3%   Values used to calculate the score:     Age: 12 years     Sex: Female     Is Non-Hispanic African American: No     Diabetic: No     Tobacco smoker: No     Systolic Blood Pressure: 163 mmHg     Is BP treated: No     HDL Cholesterol: 50.1 mg/dL     Total Cholesterol: 197 mg/dL   Patient has failed these meds in past: n/a Patient is currently controlled on the following medications:   atorvastatin 20 mg - MWF  We discussed:  diet and exercise extensively; pt is taking statin 3 times a week due to fear of side effects, she used to take daily and never had any issues. Discussed  benefits of statin, pt agreed to gradually increase statin up to daily.  Plan  Recommended to increase atorvastatin to daily dosing   Hypothyroidism   Lab Results  Component Value Date/Time   TSH 0.653 06/19/2020 03:38 PM   TSH 2.971 10/18/2019 09:06 AM   TSH 2.69 02/17/2019 04:37 PM   TSH 2.02 11/20/2017 02:37 PM   Patient has failed these meds in past: n/a Patient is currently controlled on the following medications:   levothyroxine 75 mcg qAM - 6:30am, takes with Prilosec, pain med  We discussed:  Symptoms of hypothyroidism, pt is very fatigued but also recovering from radiation. Plans to discuss with oncologist.  Plan  Continue current medications  GERD   Patient has failed these meds in past: n/a Patient is currently controlled on the following medications:   omeprazole 40 mg daily   We discussed:  Had stopped taking it a few years back, had terrible GERD  Plan   Continue  current medications   Depression/OCD   Depression screen [facility] 2/9 02/17/2019 11/20/2017 11/20/2017  Decreased Interest 3 3 1   Down, Depressed, Hopeless 3 3 1   PHQ - 2 Score 6 6 2   Altered sleeping 3 3 -  Tired, decreased energy 3 3 -  Change in appetite 0 1 -  Feeling bad or failure about yourself  0 0 -  Trouble concentrating 0 1 -  Moving slowly or fidgety/restless 0 0 -  Suicidal thoughts 0 0 -  PHQ-9 Score 12 14 -  Some recent data might be hidden   Patient has failed these meds in past: bupropion Patient is currently controlled on the following medications:   sertraline 200 mg (100 mg x 2) HS  We discussed:  Depressed since 74 y/o, reports she has tried most other antidepressants. She still struggle with depression, discussed benefits of adding something like bupropion. She took that in the past for smoking cessation and reports it didn't help, but may work syngergistically with sertraline. Pt wants to continue current therapy for now but will think about adding bupropion in the  future.  Plan  Continue current medications    Hx anal cancer   Patient has failed these meds in past: n/a Patient is currently controlled on the following medications:   prochlorperazine 10 mg q6h prn,   ondanestron 8 mg q8h prn,   loperamide 2 mg PRN,   oxycodone-apap 10-325 mg q4h prn (#180/mo),   preparation H  We discussed:  Takes Nausea meds every few weeks, takes compazine for severe nausea. Oxycodone 6 per day, been on for 4 years.   Plan  Continue current medications  Bilateral leg numbness   Patient has failed these meds in past: n/a Patient is currently {CHL Controlled/Uncontrolled:[phone]} on the following medications:   Prednisone 10 mg - 6 wk taper (60 mg to 10 mg) - began 06/23/20  We discussed:  ***  Plan  Continue {CHL [facility] [id number]}   Health Maintenance   Lab Results  Component Value Date/Time   K 3.9 06/22/2020 11:54 AM   K 3.9 06/20/2020 05:59 AM   Patient is currently controlled on the following medications:   Miralax prn,   Vitamin D 1000 IU  Potassium chloride 20 mEq daily  We discussed:  Miralax helping  Plan  Continue current medications    Medication Management   Pt uses [facility] and [facility] for all medications Uses pill box Pt endorses 99% compliance  We discussed: all meds are $0 through mail order. Pt denies issues with pharmacies.    Plan  Continue current medication management strategy     Follow up: *** month phone visit  Charlene Brooke, PharmD Clinical Pharmacist [facility] [phone]

## 2020-07-13 ENCOUNTER — Telehealth: Payer: Self-pay | Admitting: Internal Medicine

## 2020-07-13 DIAGNOSIS — M5136 Other intervertebral disc degeneration, lumbar region: Secondary | ICD-10-CM | POA: Diagnosis not present

## 2020-07-13 DIAGNOSIS — M5124 Other intervertebral disc displacement, thoracic region: Secondary | ICD-10-CM | POA: Diagnosis not present

## 2020-07-13 DIAGNOSIS — M5134 Other intervertebral disc degeneration, thoracic region: Secondary | ICD-10-CM | POA: Diagnosis not present

## 2020-07-13 DIAGNOSIS — M5137 Other intervertebral disc degeneration, lumbosacral region: Secondary | ICD-10-CM | POA: Diagnosis not present

## 2020-07-13 DIAGNOSIS — M4726 Other spondylosis with radiculopathy, lumbar region: Secondary | ICD-10-CM | POA: Diagnosis not present

## 2020-07-13 NOTE — Telephone Encounter (Signed)
   Joanne Klein from [facility] calling, Phone [phone] calling to report patient had a fall about 5am, lost her balance No injury

## 2020-07-17 ENCOUNTER — Telehealth: Payer: Self-pay

## 2020-07-17 DIAGNOSIS — M5137 Other intervertebral disc degeneration, lumbosacral region: Secondary | ICD-10-CM | POA: Diagnosis not present

## 2020-07-17 DIAGNOSIS — M4726 Other spondylosis with radiculopathy, lumbar region: Secondary | ICD-10-CM | POA: Diagnosis not present

## 2020-07-17 DIAGNOSIS — M5124 Other intervertebral disc displacement, thoracic region: Secondary | ICD-10-CM | POA: Diagnosis not present

## 2020-07-17 DIAGNOSIS — M5134 Other intervertebral disc degeneration, thoracic region: Secondary | ICD-10-CM | POA: Diagnosis not present

## 2020-07-17 DIAGNOSIS — M5136 Other intervertebral disc degeneration, lumbar region: Secondary | ICD-10-CM | POA: Diagnosis not present

## 2020-07-17 NOTE — Telephone Encounter (Signed)
Called pt, LVM.   Paperwork for [facility] received from [place]. Per PCP pt needs an OV to discuss before forms can be filled out.

## 2020-07-24 ENCOUNTER — Other Ambulatory Visit: Payer: Self-pay | Admitting: Radiation Oncology

## 2020-07-24 MED ORDER — PREDNISONE 10 MG PO TABS: ORAL_TABLET | ORAL | 1 refills | Status: DC

## 2020-07-24 NOTE — Progress Notes (Signed)
l refilled the patient's prescription and I left her a VM to confirm she had started taking the Prednisone at 30 mg/day. She would continue this until going to 20 mg/day on 08/05/20. I asked her to call us back so we could discuss further.    Carola Rhine, PAC

## 2020-07-29 ENCOUNTER — Encounter (HOSPITAL_COMMUNITY): Payer: Self-pay

## 2020-07-29 ENCOUNTER — Emergency Department (HOSPITAL_COMMUNITY): Payer: Medicare Other

## 2020-07-29 ENCOUNTER — Emergency Department (HOSPITAL_COMMUNITY)
Admission: EM | Admit: 2020-07-29 | Discharge: 2020-07-29 | Disposition: A | Discharge status: Home / Self Care | Payer: Medicare Other | Attending: Emergency Medicine | Admitting: Emergency Medicine

## 2020-07-29 DIAGNOSIS — E039 Hypothyroidism, unspecified: Secondary | ICD-10-CM | POA: Diagnosis not present

## 2020-07-29 DIAGNOSIS — Z87891 Personal history of nicotine dependence: Secondary | ICD-10-CM | POA: Insufficient documentation

## 2020-07-29 DIAGNOSIS — I10 Essential (primary) hypertension: Secondary | ICD-10-CM | POA: Insufficient documentation

## 2020-07-29 DIAGNOSIS — Y999 Unspecified external cause status: Secondary | ICD-10-CM | POA: Diagnosis not present

## 2020-07-29 DIAGNOSIS — S72002A Fracture of unspecified part of neck of left femur, initial encounter for closed fracture: Secondary | ICD-10-CM | POA: Diagnosis not present

## 2020-07-29 DIAGNOSIS — Z743 Need for continuous supervision: Secondary | ICD-10-CM | POA: Diagnosis not present

## 2020-07-29 DIAGNOSIS — M47816 Spondylosis without myelopathy or radiculopathy, lumbar region: Secondary | ICD-10-CM | POA: Diagnosis not present

## 2020-07-29 DIAGNOSIS — Y92009 Unspecified place in unspecified non-institutional (private) residence as the place of occurrence of the external cause: Secondary | ICD-10-CM | POA: Insufficient documentation

## 2020-07-29 DIAGNOSIS — G8929 Other chronic pain: Secondary | ICD-10-CM | POA: Insufficient documentation

## 2020-07-29 DIAGNOSIS — Y9301 Activity, walking, marching and hiking: Secondary | ICD-10-CM | POA: Insufficient documentation

## 2020-07-29 DIAGNOSIS — M25569 Pain in unspecified knee: Secondary | ICD-10-CM | POA: Insufficient documentation

## 2020-07-29 DIAGNOSIS — R109 Unspecified abdominal pain: Secondary | ICD-10-CM | POA: Diagnosis not present

## 2020-07-29 DIAGNOSIS — Z79899 Other long term (current) drug therapy: Secondary | ICD-10-CM | POA: Insufficient documentation

## 2020-07-29 DIAGNOSIS — J449 Chronic obstructive pulmonary disease, unspecified: Secondary | ICD-10-CM | POA: Diagnosis not present

## 2020-07-29 DIAGNOSIS — Z79890 Hormone replacement therapy (postmenopausal): Secondary | ICD-10-CM | POA: Insufficient documentation

## 2020-07-29 DIAGNOSIS — W010XXA Fall on same level from slipping, tripping and stumbling without subsequent striking against object, initial encounter: Secondary | ICD-10-CM | POA: Diagnosis not present

## 2020-07-29 DIAGNOSIS — S79912A Unspecified injury of left hip, initial encounter: Secondary | ICD-10-CM | POA: Diagnosis not present

## 2020-07-29 DIAGNOSIS — R0789 Other chest pain: Secondary | ICD-10-CM | POA: Diagnosis not present

## 2020-07-29 DIAGNOSIS — S72112A Displaced fracture of greater trochanter of left femur, initial encounter for closed fracture: Secondary | ICD-10-CM | POA: Diagnosis not present

## 2020-07-29 DIAGNOSIS — R079 Chest pain, unspecified: Secondary | ICD-10-CM | POA: Diagnosis not present

## 2020-07-29 DIAGNOSIS — G4489 Other headache syndrome: Secondary | ICD-10-CM | POA: Diagnosis not present

## 2020-07-29 DIAGNOSIS — W19XXXA Unspecified fall, initial encounter: Secondary | ICD-10-CM

## 2020-07-29 MED ORDER — OXYCODONE-ACETAMINOPHEN 5-325 MG PO TABS
2.0000 | ORAL_TABLET | Freq: Once | ORAL | Status: AC
  Administered 2020-07-29: 2 via ORAL
  Filled 2020-07-29: qty 2

## 2020-07-29 NOTE — ED Provider Notes (Signed)
  Face-to-face evaluation   History: Patient presents for evaluation of pain in left hip, after a fall. She describes a mechanical fall, tripping on the threshold. She was unable to get up after the fall. She has chronic weakness in her lower legs, chronic back pain and uses a walker all the time. Today she fell while she was using her walker.  Physical exam: Alert, calm, cooperative. She is obese. She is unable to mobilize her left or right leg secondary to pain or weakness. She does not have pain in her left hip with passive range of motion. Patient is lucid.  Medical screening examination/treatment/procedure(s) were conducted as a shared visit with non-physician practitioner(s) and myself.  I personally evaluated the patient during the encounter    Daleen Bo, MD 08/02/20 1016

## 2020-07-29 NOTE — Discharge Instructions (Addendum)
May use your home pain medications for any additional pain.  Follow-up with the orthopedic specialist listed in about 2 weeks.  Call the office tomorrow to set up this appointment.

## 2020-07-29 NOTE — ED Notes (Signed)
She was able to ambulate with a rolling walker.

## 2020-07-29 NOTE — ED Provider Notes (Signed)
[facility] DEPT Provider Note   CSN: [id number] Arrival date & time: 07/29/20  1120     History Chief Complaint  Patient presents with   Fall   Hip Pain    Joanne Klein is a 74 y.o. female.  HPI      Joanne Klein is a 74 y.o. female, with a history of fibromyalgia, GERD, emphysema, HTN, cancer, presenting to the ED with left hip pain following a fall that occurred shortly prior to arrival. Patient states she has difficulty ambulating due to chronic knee pain as well as chronic bilateral lower extremity numbness she has had for several months. She was walking over a threshold in her house, tripped, and fell to her knees.  She has no increased pain in her knees, however, she does have pain in her left hip, aching, 4/10, nonradiating. States she walks with a walker at baseline. Denies anticoagulation. Denies head injury, neck/back pain, chest pain, abdominal pain, shortness of breath, acute numbness, acute weakness, or any other complaints.  Past Medical History:  Diagnosis Date   Allergic rhinitis    Arthritis    Bursitis    Both Shoulders   Chronic low back pain    Degenerative disk disease    Depression    Diverticulitis    Emphysema of lung (HCC)    Fibromyalgia    GERD (gastroesophageal reflux disease)    History of fibromyalgia    Hypertension    Hypothyroidism    Irritable bowel syndrome    Neuralgia, post-herpetic    Obesity, unspecified    Obstructive sleep apnea    OCD (obsessive compulsive disorder)    Pain in joint, site unspecified    Pure hypercholesterolemia    Scoliosis    Situational stress    Spastic colon    Tubular adenoma    Uterine cancer [facility])     Patient Active Problem List   Diagnosis Date Noted   Pain    Lumbar radiculopathy    Weakness of both legs 06/19/2020   Falls frequently 06/19/2020   Weakness 06/19/2020   Chronic respiratory failure with hypoxia ([place])  12/15/2019   Orthostatic hypotension 12/15/2019   Numbness and tingling 12/15/2019   Chemotherapy-induced neutropenia (HCC)    Thrombocytopenia ([place]) 10/17/2019   Anal cancer ([place]) 09/15/2019   Routine general medical examination at a health care facility 11/19/2016   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   Morbid obesity ([place]) 06/26/2014   COPD GOLD II     Hyperlipidemia    Arthritis 01/16/2014   Depression 01/13/2014   Hypothyroidism 01/13/2014   GERD (gastroesophageal reflux disease) 01/13/2014   OCD (obsessive compulsive disorder) 01/13/2014   Fibromyalgia 01/13/2014    Past Surgical History:  Procedure Laterality Date   BREAST LUMPECTOMY     knot removed   CHOLECYSTECTOMY     HYSTEROTOMY     RADIOLOGY WITH ANESTHESIA Bilateral 06/14/2020   Procedure: MRI WITH ANESTHESIA;  Surgeon: Radiologist, Medication, MD;  Location: [facility];  Service: Radiology;  Laterality: Bilateral;   RADIOLOGY WITH ANESTHESIA N/A 06/21/2020   Procedure: MRI WITH ANESTHESIA;  Surgeon: Radiologist, Medication, MD;  Location: [facility];  Service: Radiology;  Laterality: N/A;   TONSILLECTOMY     tumor in left forearm       OB History   No obstetric history on file.     Family History  Problem Relation Age of Onset   Stomach cancer Father    Liver cancer Father    Diabetes  Father    Colon cancer Neg Hx     Social History   Tobacco Use   Smoking status: Former Smoker    Packs/day: 2.00    Years: 34.00    Pack years: 68.00    Types: Cigarettes    Quit date: 12/15/1996    Years since quitting: 23.6   Smokeless tobacco: Never Used   Tobacco comment: vapes x 2 years  Vaping Use   Vaping Use: Every day   Substances: Nicotine  Substance Use Topics   Alcohol use: Not Currently    Alcohol/week: 0.0 standard drinks    Comment: Caffine Intake 2x's daily   Drug use: No    Home Medications Prior to Admission medications   Medication Sig Start Date End Date Taking? Authorizing  Provider  atorvastatin (LIPITOR) 20 MG tablet TAKE 1 TABLET BY MOUTH  EVERY OTHER DAY Patient taking differently: Take 20 mg by mouth 4 (four) times a week. Saturday, Sunday, Tuesday, and Thursday 02/13/20   Hoyt Koch, MD  calcium carbonate (TUMS - DOSED IN MG ELEMENTAL CALCIUM) 500 MG chewable tablet Chew 1 tablet by mouth daily as needed.     [provider]  Cholecalciferol (VITAMIN D-3) 1000 UNITS CAPS Take 1,000 Units by mouth daily.     [provider]  levothyroxine (SYNTHROID) 75 MCG tablet TAKE 1 TABLET BY MOUTH  DAILY BEFORE BREAKFAST Patient taking differently: Take 75 mcg by mouth daily before breakfast.  02/07/20   Hoyt Koch, MD  omeprazole (PRILOSEC) 40 MG capsule TAKE 1 CAPSULE BY MOUTH  DAILY Patient taking differently: Take 40 mg by mouth daily.  02/07/20   Hoyt Koch, MD  ondansetron (ZOFRAN) 8 MG tablet Take 8 mg by mouth every 8 (eight) hours as needed for nausea or vomiting.    [provider]  oxyCODONE-acetaminophen (PERCOCET) 10-325 MG tablet Take 1 tablet by mouth every 4 (four) hours. Gets from another provider @@ 180 per month     [provider]  phenylephrine-shark liver oil-mineral oil-petrolatum (PREPARATION H) 0.25-3-14-71.9 % rectal ointment Place 1 application rectally 2 (two) times daily as needed for hemorrhoids.    [provider]  Polyethylene Glycol 3350 (MIRALAX PO) Take 17 g by mouth at bedtime.     [provider]  potassium chloride SA (KLOR-CON) 20 MEQ tablet Take 1 tablet (20 mEq total) by mouth daily. 06/12/20   Ladell Pier, MD  predniSONE (DELTASONE) 10 MG tablet Take by mouth as directed by taper 07/24/20   Hayden Pedro, PA-C  prochlorperazine (COMPAZINE) 10 MG tablet Take 10 mg by mouth every 6 (six) hours as needed for nausea or vomiting.    [provider]  senna-docusate (SENOKOT-S) 8.6-50 MG tablet Take 1 tablet by mouth at bedtime as needed  for mild constipation. Patient not taking: Reported on 07/05/2020 06/23/20   Georgette Shell, MD  sertraline (ZOLOFT) 100 MG tablet TAKE 2 TABLETS BY MOUTH AT  BEDTIME Patient taking differently: Take 200 mg by mouth every evening.  02/07/20   Hoyt Koch, MD    Allergies    Prozac [fluoxetine hcl], Codeine, Morphine and related, and Sulfa antibiotics  Review of Systems   Review of Systems  Constitutional: Negative for diaphoresis.  Respiratory: Negative for shortness of breath.   Cardiovascular: Negative for chest pain.  Gastrointestinal: Negative for abdominal pain, nausea and vomiting.  Musculoskeletal: Positive for arthralgias. Negative for back pain and neck pain.  Neurological: Negative for  dizziness, syncope, weakness and numbness.  All other systems reviewed and are negative.   Physical Exam Updated Vital Signs BP (!) 150/86 (BP Location: Right Arm)    Pulse 88    Temp 98.4 F (36.9 C) (Oral)    Resp 18    Ht 5\' 6"  (1.676 m)    Wt 95.3 kg    LMP  (LMP Unknown)    SpO2 90%    BMI 33.89 kg/m   Physical Exam Vitals and nursing note reviewed.  Constitutional:      General: She is not in acute distress.    Appearance: She is well-developed. She is not diaphoretic.  HENT:     Head: Normocephalic and atraumatic.     Mouth/Throat:     Mouth: Mucous membranes are moist.     Pharynx: Oropharynx is clear.  Eyes:     Conjunctiva/sclera: Conjunctivae normal.  Cardiovascular:     Rate and Rhythm: Normal rate and regular rhythm.     Pulses: Normal pulses.          Radial pulses are 2+ on the right side and 2+ on the left side.       Posterior tibial pulses are 2+ on the right side and 2+ on the left side.     Heart sounds: Normal heart sounds.     Comments: Tactile temperature in the extremities appropriate and equal bilaterally. Pulmonary:     Effort: Pulmonary effort is normal. No respiratory distress.     Breath sounds: Normal breath sounds.  Abdominal:      Palpations: Abdomen is soft.     Tenderness: There is no abdominal tenderness. There is no guarding.  Musculoskeletal:     Cervical back: Neck supple.     Right lower leg: No edema.     Left lower leg: No edema.     Comments: Tenderness to the lateral left hip without noted deformity, instability, or swelling. No tenderness to the rest of the pelvis, right hip, or the rest of the lower extremities. Normal motor function intact in all extremities. No midline spinal tenderness.  Overall trauma exam performed without any abnormalities noted other than those mentioned.  Skin:    General: Skin is warm and dry.  Neurological:     Mental Status: She is alert.     Comments: Patient endorses numbness in the lower extremities which is normal for her. She can lift both her lower extremities, which she states is baseline for her as well. Strength 5/5 in the upper extremities.  Psychiatric:        Mood and Affect: Mood and affect normal.        Speech: Speech normal.        Behavior: Behavior normal.     ED Results / Procedures / Treatments   Labs (all labs ordered are listed, but only abnormal results are displayed) Labs Reviewed  SARS CORONAVIRUS 2 BY RT PCR (HOSPITAL ORDER, [facility])    EKG None  Radiology DG Hip Unilat W or Wo Pelvis 2-3 Views Left  Result Date: 07/29/2020 CLINICAL DATA:  Fall with left hip pain EXAM: DG HIP (WITH OR WITHOUT PELVIS) 2-3V LEFT COMPARISON:  None. FINDINGS: Approximately 2 cm bone fragment adjacent to the superior left greater trochanter suspicious for avulsion fracture. No left hip dislocation. No focal osseous lesions. No pelvic diastasis. Degenerative changes in the visualized lower lumbar spine. IMPRESSION: Findings suspicious for avulsion fracture at the superior left greater  trochanter. Electronically Signed   By: Ilona Sorrel M.D.   On: 07/29/2020 14:03    Procedures Procedures (including critical care  time)  Medications Ordered in ED Medications  oxyCODONE-acetaminophen (PERCOCET/ROXICET) 5-325 MG per tablet 2 tablet (2 tablets Oral Given 07/29/20 1524)    ED Course  I have reviewed the triage vital signs and the nursing notes.  Pertinent labs & imaging results that were available during my care of the patient were reviewed by me and considered in my medical decision making (see chart for details).  Clinical Course as of Jul 29 1609  Nancy Fetter Jul 29, 2020  1423 Spoke with Dr. Wynelle Link, orthopedic surgeon.  We reviewed the patient's complaint, physical exam, and x-ray results. She can be weightbearing as tolerated. He states if she is unable to walk and needs to be admitted, he will come see her during her admission. If she is admitted due to being unable to walk, additional imaging would be appreciated.  If she would be able to get MRI here today, that would be preferred, however, if she would not be able to get the MRI today, then CT of the hip is fine for now.   [SJ]  3086 Patient now stating she does not want to stay here. She says she thinks she can walk with assistance. She uses a walker at home.    [SJ]  35 Updated Dr. Wynelle Link on plan to discharge patient.  States she can follow-up in the office in 2 weeks.   [SJ]  1518 Patient now states doesn't want home health.  She states her niece has agreed to help her and can stay with her. Simultaneously, social worker, Chief Technology Officer, states she checked and patient would only qualify for [facility] home health. Patient also very much does not want [facility] home health.   [SJ]    Clinical Course User Index [SJ] Janeya Deyo, Helane Gunther, PA-C   MDM Rules/Calculators/A&P                          Patient presents with left hip pain following a fall. No focal neurologic deficits noted. I personally reviewed and interpreted the patient's x-ray. Left hip avulsion fracture noted. Patient was able to ambulate using a walker as she does at home.  She states her niece  will be caring for her.  Findings and plan of care discussed with Daleen Bo, MD. Dr. Eulis Foster personally evaluated and examined this patient.   Final Clinical Impression(s) / ED Diagnoses Final diagnoses:  Fall, initial encounter  Closed avulsion fracture of left hip, initial encounter [facility])    Rx / DC Orders ED Discharge Orders         [facility]  Status:  Canceled     Reprint     07/29/20 1515    Face-to-face encounter (required for Medicare/Medicaid patients)  Status:  Canceled     Reprint    Comments: I Helane Gunther Chistina Roston certify that this patient is under my care and that I, or a nurse practitioner or physician's assistant working with me, had a face-to-face encounter that meets the physician face-to-face encounter requirements with this patient on 07/29/2020. The encounter with the patient was in whole, or in part for the following medical condition(s) which is the primary reason for home health care (List medical condition): lower extremity weakness and numbness as well as acute left hip avulsion fracture.   07/29/20 1515  Lorayne Bender, PA-C 07/29/20 1612    Daleen Bo, MD 08/02/20 1016

## 2020-07-29 NOTE — ED Triage Notes (Signed)
She states she tripped at home when her feet were caught in an uneven surface between different flooring. She denies striking her head, dizziness, nor any l.o.c. she is alert and in no distress. She c/o left hip area pain. She tells me she has had issues with her "legs are weak and giving out" for about a year now. She also states she has seen multiple doctors for this this year and had recently had "a bunch of m.r.i.'s" [sic].

## 2020-07-30 NOTE — Progress Notes (Signed)
TOC CM left HIPAA complaint voice message for pt to return call. [place], [place] ED TOC CM [phone]

## 2020-07-31 NOTE — Progress Notes (Addendum)
07/31/2020 239 pm TOC CM spoke to pt via phone. Offered choice for [facility]. She is agreeable to [place] but wants female PT. Contacted [employer] rep with new referral. Jonnie Finner RN [place], [place] ED TOC CM [phone]

## 2020-08-01 ENCOUNTER — Telehealth: Payer: Self-pay | Admitting: *Deleted

## 2020-08-01 ENCOUNTER — Telehealth: Payer: Self-pay

## 2020-08-01 NOTE — Telephone Encounter (Signed)
LVM for Joanne Klein stating we received prior forms faxed and they were returned with a note informing her that pt needs an OV.    I have also called pt again to make her aware. No answer, LVM.

## 2020-08-01 NOTE — Telephone Encounter (Signed)
TOC CM reviewed notes and in encounter [facility] had reached out to PCP office to have [facility] recertification paperwork done. TOC CM contacted [facility] rep to verify if they are accepting referral or declining. Waiting call back  [facility]. [facility]-declined   Jonnie Finner RN [place], [place] ED TOC CM [phone]

## 2020-08-01 NOTE — Telephone Encounter (Signed)
New message    [facility] home health checking on status of home health certification form  9.0.94  to  9.4.21  Patient is out of medicare compliance   Dr. Sharlet Salina fax number was given [phone] to refax the form over

## 2020-08-01 NOTE — Telephone Encounter (Signed)
Patient called to clarify her upcoming appointments. Instructed her to call when the CT is scheduled, since her pre-CT lab may need to be moved. She is asking if she can stop the K+ now since her diarrhea is gone. OK per Dr. Benay Spice and will reassess at lab in Sept.

## 2020-08-02 ENCOUNTER — Telehealth: Payer: Self-pay | Admitting: *Deleted

## 2020-08-02 NOTE — Telephone Encounter (Signed)
TOC CM received confirmation from [facility] that they are unable to accept referral. Spoke to pt via phone, and states she received several pages of exercises to do at home and she has been doing exercises. She has not fallen and using DME. Jonnie Finner RN C[facility], [facility] ED TOC CM [phone]  [facility]-declined due to staffing [facility]-declined due to staffing

## 2020-08-06 ENCOUNTER — Other Ambulatory Visit: Payer: Self-pay

## 2020-08-06 ENCOUNTER — Inpatient Hospital Stay: Payer: Medicare Other | Attending: Nurse Practitioner | Admitting: Oncology

## 2020-08-06 ENCOUNTER — Telehealth: Payer: Self-pay | Admitting: Radiation Oncology

## 2020-08-06 ENCOUNTER — Inpatient Hospital Stay: Payer: Medicare Other

## 2020-08-06 VITALS — BP 157/90 | HR 99 | Temp 97.8°F | Resp 18 | Ht 66.0 in

## 2020-08-06 DIAGNOSIS — Z923 Personal history of irradiation: Secondary | ICD-10-CM | POA: Diagnosis not present

## 2020-08-06 DIAGNOSIS — M797 Fibromyalgia: Secondary | ICD-10-CM | POA: Insufficient documentation

## 2020-08-06 DIAGNOSIS — D6959 Other secondary thrombocytopenia: Secondary | ICD-10-CM | POA: Insufficient documentation

## 2020-08-06 DIAGNOSIS — G8929 Other chronic pain: Secondary | ICD-10-CM | POA: Insufficient documentation

## 2020-08-06 DIAGNOSIS — C210 Malignant neoplasm of anus, unspecified: Secondary | ICD-10-CM

## 2020-08-06 DIAGNOSIS — J449 Chronic obstructive pulmonary disease, unspecified: Secondary | ICD-10-CM | POA: Diagnosis not present

## 2020-08-06 DIAGNOSIS — E039 Hypothyroidism, unspecified: Secondary | ICD-10-CM | POA: Diagnosis not present

## 2020-08-06 DIAGNOSIS — E041 Nontoxic single thyroid nodule: Secondary | ICD-10-CM | POA: Diagnosis not present

## 2020-08-06 DIAGNOSIS — M549 Dorsalgia, unspecified: Secondary | ICD-10-CM | POA: Insufficient documentation

## 2020-08-06 DIAGNOSIS — I951 Orthostatic hypotension: Secondary | ICD-10-CM | POA: Diagnosis not present

## 2020-08-06 DIAGNOSIS — Z79899 Other long term (current) drug therapy: Secondary | ICD-10-CM | POA: Insufficient documentation

## 2020-08-06 DIAGNOSIS — F329 Major depressive disorder, single episode, unspecified: Secondary | ICD-10-CM | POA: Insufficient documentation

## 2020-08-06 LAB — BASIC METABOLIC PANEL - CANCER CENTER ONLY
Anion gap: 10 (ref 5–15)
BUN: 31 mg/dL — ABNORMAL HIGH (ref 8–23)
CO2: 26 mmol/L (ref 22–32)
Calcium: 9.3 mg/dL (ref 8.9–10.3)
Chloride: 106 mmol/L (ref 98–111)
Creatinine: 0.87 mg/dL (ref 0.44–1.00)
GFR, Est AFR Am: >60 mL/min (ref >60)
GFR, Estimated: >60 mL/min (ref >60)
Glucose, Bld: 84 mg/dL (ref 70–99)
Potassium: 3.9 mmol/L (ref 3.5–5.1)
Sodium: 142 mmol/L (ref 135–145)

## 2020-08-06 NOTE — Telephone Encounter (Signed)
l called and LM for the patient to see how she was doing. She should have switched to 20 mg/day Prednisone yesterday as long as she was doing well. I would have her drop her dose to 10 mg a day on 8/29, and then we will continue a slow tapered dose further from there. I asked that she call back to let us know how she is doing.    Carola Rhine, PAC

## 2020-08-06 NOTE — Progress Notes (Signed)
[facility] OFFICE PROGRESS NOTE   Diagnosis: Anal cancer  INTERVAL HISTORY:   Joanne Klein returns as scheduled.  She continues a slow prednisone taper directed by radiation oncology.  She currently is taking prednisone at a dose of 20 mg daily.  No GI upset or thrush while on prednisone.  She reports improvement in leg weakness with the initial prednisone dose.  The weakness has returned.  She can ambulate short distances with a walker.  No difficulty with bowel or bladder control.   Objective:  Vital signs in last 24 hours:  Blood pressure (!) 157/90, pulse 99, temperature 97.8 F (36.6 C), temperature source Axillary, resp. rate 18, height 5\' 6"  (1.676 m), SpO2 94 %.     Lymphatics: No cervical, supraclavicular, axillary, or inguinal nodes Resp: Lungs clear bilaterally Cardio: Regular rate and rhythm GI: No hepatomegaly, nontender Vascular: No leg edema Neuro: 3/5 strength in the legs bilaterally, markedly diminished with dorsiflexion of the feet and flexion at the hips.  She could stand partially unassisted. Skin: Small ecchymoses over the legs    Lab Results:  Lab Results  Component Value Date   WBC 4.8 06/22/2020   HGB 12.1 06/22/2020   HCT 36.1 06/22/2020   MCV 97.0 06/22/2020   PLT 112 (L) 06/22/2020   NEUTROABS 3.6 06/19/2020    CMP  Lab Results  Component Value Date   NA 138 06/22/2020   K 3.9 06/22/2020   CL 103 06/22/2020   CO2 27 06/22/2020   GLUCOSE 115 (H) 06/22/2020   BUN 30 (H) 06/22/2020   CREATININE 1.13 (H) 06/22/2020   CALCIUM 8.9 06/22/2020   PROT 6.0 (L) 06/22/2020   ALBUMIN 3.3 (L) 06/22/2020   AST 18 06/22/2020   ALT 13 06/22/2020   ALKPHOS 72 06/22/2020   BILITOT 1.7 (H) 06/22/2020   GFRNONAA 49 (L) 06/22/2020   GFRAA 56 (L) 06/22/2020     Medications: I have reviewed the patient's current medications.   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 Biopsy 08/30/2019 confirmed  well-differentiated squamous cell carcinoma with positive P 16 and p63 stains ? CTs 09/14/2019-abnormal soft tissue fullness at the lower anus/perianal soft tissues, asymmetric left inguinal lymphadenopathy, borderline enlarged left external iliac node, mild stranding and cutaneous thickening of the left gluteal fold, 2 to 3 mm pulmonary nodules, 1.6 cm right hepatic lesion-likely a hemangioma ? PET scan 83/02/8249-[id number] hypermetabolic anal mass. 3 hypermetabolic left inguinal lymph nodes. Left external iliac node with minimally higher than blood pool activity. 1.0 x 0.8 cm left thyroid nodule with activity mildly above background blood pool activity but below liver activity. No perceptible hypermetabolic activity in the vicinity of the lateral right hepatic lobe lesion seen on the 09/14/2019 CT. ? Began concurrent chemoRT with 5FU/mitomycin on 10/03/2019 ? Cycle25-FU, dose reduced, mitomycin held 11/07/2019 ? Radiation completed 11/24/2019 ? CT abdomen/pelvis 12/27/2019-extensive left-sided colonic diverticulosis without diverticulitis. No bowel obstruction. No abscess in the abdomen or pelvis. Rectum borderline distended with stool without perirectal wall thickening or soft tissue stranding. Left inguinal and external iliac lymph nodes now subcentimeter compared to the previous size. Stable small lesion in the right lobe of the liver. No new liver lesions evident. 2. COPD 3. Fibromyalgia 4. Chronic back pain 5. Hypothyroid 6. Depression 7. History of uterine cancer-age 10 8. Pain secondary to #1 9. Orthostatic hypotension, fatigue, dyspnea on exertion, early mucositis requiring supportive care on day 9, secondary to chemotherapy  10. Thrombocytopenia PLT 58K and  neutropenia ANC 1.4 on day 9, secondary to chemotherapy 11. Worsening cytopenias, PLT 8K and ANC 0.0 on day 15, secondary to chemotherapy. Started prophylaxis with cipro on 10/22 day 11 12. Hospital admission 10/17/2019 -fatigue,  pancytopenia, hypokalemia 13.Left thyroid nodule with activity mildly above background blood pool activity butbelowliver activity on PET scan 09/27/2019.Thyroid ultrasound219 2021-1.6 cm nodule left inferior thyroid gland. 0/0/1749 FNA-benign follicular nodule. 14.Tiny lung nodules noted on chest CT 09/14/2019 15. Falls, back/leg pain and leg weakness-CT lumbar spine 05/26/2020 with no acute abnormality within the lumbar spine; multilevel degenerative spondylosis with resultant mild canal with bilateral lateral recess stenosis at L4-5; moderate bilateral L4 and L5 foraminal stenosis related to disc bulge, reactive endplate changes and facet degeneration; soft tissue density/stranding involving the presacral space anterior to the sacrum, incompletely visualized  MRI thoracic and lumbar spine 06/14/2020-no mass or fracture, degenerative changes in the thoracic and lumbar spine  MRI brain 06/21/2020-no acute abnormality  MRI cervical, thoracic and lumbar spine 06/21/2020-no abnormal cervical cord signal, mild cervical degenerative changes without high-grade stenosis, no significant abnormal enhancement in the thoracolumbar spine.  MRI pelvis 06/21/2020-no clearly defined mass or asymmetry in the anal/perianal region or adjacent rectum, mild but new presacral edema within the iliacus muscles, piriformis muscles, central gluteus muscles, and bilateral hip adductor musculature in a symmetric fashion. This is also accompanied by low-grade enhancement. This may reflect myositis, and association with prior regional radiation therapy is not excluded.  Trial of prednisone 60 mg daily 06/22/2020, tapered by radiation oncology, dose increased to 40 mg daily 08/06/2020    Disposition: Joanne Klein appears unchanged.  She continues to have pain in the legs.  She has bilateral leg weakness.  The etiology of the pain and weakness remains unclear.  She is being treated for possible radiation toxicity to the lumbosacral  nerve roots.  She reports improvement in her symptoms with the high-dose prednisone started last month.  We decided to increase the prednisone to 40 mg daily.  She will undergo restaging CTs and return for an office visit in approximately 2 weeks.  We will make a neurology referral if the CTs show no explanation for the leg weakness.  Betsy Coder, MD  08/06/2020  12:44 PM

## 2020-08-07 ENCOUNTER — Telehealth: Payer: Self-pay

## 2020-08-07 ENCOUNTER — Telehealth: Payer: Self-pay | Admitting: Radiation Oncology

## 2020-08-07 NOTE — Telephone Encounter (Signed)
The patient called me back and left a message that was on my voicemail this morning.  Apparently she actually fell on 07/29/2020 and went to the ED.  The description of her fall was that she fell on an uneven surface between different types of flooring.  Prior to this in our conversation she said she was doing quite well.  At that point in time she would have been on 30 mg a day of prednisone.  She states that her weakness had improved significantly.  With her fall she reports that she has gone downhill in terms of her weakness progressing, she saw Dr. Benay Spice yesterday who recommended going back up to 40 mg of prednisone a day, Dr. Lisbeth Renshaw has also weighed in and would recommend that she continue at 40 mg possibly for up to 4 weeks before making another adjustment.  We will discuss her case further once she has had her next set of scans on the 27th and sees Dr. Benay Spice.

## 2020-08-07 NOTE — Telephone Encounter (Signed)
Returned patient's phone call about canceling CT appointment on 08/17/20. Patient states that she wasn't feeling well and wanted to wait until she healed up better before having CT done. Gave patient phone number to central scheduling. Patient states she will cancel it but she is not sure when she will reschedule it.

## 2020-08-08 ENCOUNTER — Other Ambulatory Visit: Payer: Self-pay | Admitting: *Deleted

## 2020-08-08 MED ORDER — PREDNISONE 20 MG PO TABS: 40.0000 mg | ORAL_TABLET | Freq: Every day | ORAL | 0 refills | Status: DC

## 2020-08-08 NOTE — Progress Notes (Signed)
Patient called for script w/prednisone dose change per Dr. Benay Spice discussion at office visit. Confirmed she has been changed to 40 mg po daily.

## 2020-08-16 ENCOUNTER — Telehealth: Payer: Self-pay | Admitting: Radiation Oncology

## 2020-08-16 NOTE — Telephone Encounter (Signed)
LM for patient to call us back to see how she's doing on 40 mg/day prednisone.

## 2020-08-17 ENCOUNTER — Ambulatory Visit (HOSPITAL_COMMUNITY): Payer: Medicare Other

## 2020-08-24 ENCOUNTER — Telehealth: Payer: Self-pay | Admitting: *Deleted

## 2020-08-24 NOTE — Telephone Encounter (Signed)
Spoke with the patients niece Andi to see how she was doing.  She states that she sleeps odd hours and does not return phone calls.  She states the patient is doing better since we started her on prednisone.  She states that her pain is better.  I let her know that she is to remain on her current dosage.  I asked that she have the patient call me here at [phone].  She verbalized understanding.  Will continue to follow as necessary.  Gloriajean Dell. Leonie Green, BSN

## 2020-09-07 ENCOUNTER — Other Ambulatory Visit: Payer: Medicare Other

## 2020-09-11 ENCOUNTER — Telehealth: Payer: Self-pay | Admitting: *Deleted

## 2020-09-11 ENCOUNTER — Inpatient Hospital Stay: Payer: Medicare Other | Attending: Nurse Practitioner | Admitting: Oncology

## 2020-09-11 NOTE — Telephone Encounter (Signed)
Left VM requesting patient return call for reschedule of missed visit today and if she is ready to get her CT scan scheduled.

## 2020-09-12 ENCOUNTER — Telehealth: Payer: Self-pay | Admitting: *Deleted

## 2020-09-12 NOTE — Telephone Encounter (Addendum)
Left VM w/apology for missing her appointment yesterday. States she is not confident that the CT scan will be of much benefit and wants Dr. Gearldine Shown thoughts on that. She reports falling at home 08/31/20 when her knee gave out. Felt weak all last week. She is currently able to stand. States it is difficult to predict what days she will or will not be able to stand or leave the home.  Attempted to return call and she did not answer-left VM. Asking what dose of Prednisone she is taking (40 mg/day was prior script and she should have run out by now).  Patient returned call: has enough prednisone to last till Saturday. Can't tell if this has helped much this time--still very weak with difficulty standing at times. She thinks she feels weaker than when she was last in office.

## 2020-09-13 ENCOUNTER — Telehealth: Payer: Self-pay | Admitting: *Deleted

## 2020-09-13 MED ORDER — PREDNISONE 20 MG PO TABS: 30.0000 mg | ORAL_TABLET | Freq: Every day | ORAL | 0 refills | Status: DC

## 2020-09-13 NOTE — Telephone Encounter (Signed)
Per Dr. Benay Spice: Can wait on CT till after he evaluates her. Prednisone taper down to 30 mg/day. F/U in office on 09/21/20 at 1:45 pm.  Notified patient via VM and requested return call to confirm.

## 2020-09-14 ENCOUNTER — Encounter: Payer: Self-pay | Admitting: *Deleted

## 2020-09-20 ENCOUNTER — Telehealth: Payer: Self-pay | Admitting: *Deleted

## 2020-09-20 NOTE — Telephone Encounter (Signed)
Called to cancel her 10/1 appointment. Found out she can use our transport team, but needs to have a w/c to get her to the car. She will be getting a wheelchair soon and requests to reschedule for next week. Put appointment in [place] and she will see it. Per Dr. Benay Spice:

## 2020-09-21 ENCOUNTER — Inpatient Hospital Stay: Payer: Medicare Other | Admitting: Oncology

## 2020-09-24 ENCOUNTER — Ambulatory Visit: Payer: Medicare Other | Admitting: Oncology

## 2020-09-27 ENCOUNTER — Emergency Department (HOSPITAL_COMMUNITY): Payer: Medicare Other

## 2020-09-27 ENCOUNTER — Inpatient Hospital Stay (HOSPITAL_COMMUNITY)
Admission: EM | Admit: 2020-09-27 | Discharge: 2020-10-10 | DRG: 175 | Disposition: A | Discharge status: Skilled Nursing Facility | Payer: Medicare Other | Attending: Internal Medicine | Admitting: Internal Medicine

## 2020-09-27 ENCOUNTER — Encounter (HOSPITAL_COMMUNITY): Payer: Self-pay

## 2020-09-27 DIAGNOSIS — Z87891 Personal history of nicotine dependence: Secondary | ICD-10-CM

## 2020-09-27 DIAGNOSIS — D7589 Other specified diseases of blood and blood-forming organs: Secondary | ICD-10-CM | POA: Diagnosis present

## 2020-09-27 DIAGNOSIS — J9601 Acute respiratory failure with hypoxia: Secondary | ICD-10-CM | POA: Diagnosis present

## 2020-09-27 DIAGNOSIS — Z6835 Body mass index (BMI) 35.0-35.9, adult: Secondary | ICD-10-CM

## 2020-09-27 DIAGNOSIS — R195 Other fecal abnormalities: Secondary | ICD-10-CM | POA: Diagnosis not present

## 2020-09-27 DIAGNOSIS — M1712 Unilateral primary osteoarthritis, left knee: Secondary | ICD-10-CM | POA: Diagnosis not present

## 2020-09-27 DIAGNOSIS — Z7952 Long term (current) use of systemic steroids: Secondary | ICD-10-CM

## 2020-09-27 DIAGNOSIS — Y842 Radiological procedure and radiotherapy as the cause of abnormal reaction of the patient, or of later complication, without mention of misadventure at the time of the procedure: Secondary | ICD-10-CM | POA: Diagnosis present

## 2020-09-27 DIAGNOSIS — Z86711 Personal history of pulmonary embolism: Secondary | ICD-10-CM

## 2020-09-27 DIAGNOSIS — M79606 Pain in leg, unspecified: Secondary | ICD-10-CM | POA: Diagnosis not present

## 2020-09-27 DIAGNOSIS — N390 Urinary tract infection, site not specified: Secondary | ICD-10-CM | POA: Diagnosis not present

## 2020-09-27 DIAGNOSIS — J9611 Chronic respiratory failure with hypoxia: Secondary | ICD-10-CM | POA: Diagnosis not present

## 2020-09-27 DIAGNOSIS — Z8542 Personal history of malignant neoplasm of other parts of uterus: Secondary | ICD-10-CM

## 2020-09-27 DIAGNOSIS — Z9181 History of falling: Secondary | ICD-10-CM | POA: Diagnosis not present

## 2020-09-27 DIAGNOSIS — F32A Depression, unspecified: Secondary | ICD-10-CM | POA: Diagnosis not present

## 2020-09-27 DIAGNOSIS — D123 Benign neoplasm of transverse colon: Secondary | ICD-10-CM | POA: Diagnosis not present

## 2020-09-27 DIAGNOSIS — I2699 Other pulmonary embolism without acute cor pulmonale: Secondary | ICD-10-CM | POA: Diagnosis not present

## 2020-09-27 DIAGNOSIS — C210 Malignant neoplasm of anus, unspecified: Secondary | ICD-10-CM | POA: Diagnosis not present

## 2020-09-27 DIAGNOSIS — B9620 Unspecified Escherichia coli [E. coli] as the cause of diseases classified elsewhere: Secondary | ICD-10-CM | POA: Diagnosis present

## 2020-09-27 DIAGNOSIS — E785 Hyperlipidemia, unspecified: Secondary | ICD-10-CM | POA: Diagnosis not present

## 2020-09-27 DIAGNOSIS — K644 Residual hemorrhoidal skin tags: Secondary | ICD-10-CM | POA: Diagnosis present

## 2020-09-27 DIAGNOSIS — M2550 Pain in unspecified joint: Secondary | ICD-10-CM | POA: Diagnosis not present

## 2020-09-27 DIAGNOSIS — D701 Agranulocytosis secondary to cancer chemotherapy: Secondary | ICD-10-CM | POA: Diagnosis not present

## 2020-09-27 DIAGNOSIS — Z66 Do not resuscitate: Secondary | ICD-10-CM | POA: Diagnosis not present

## 2020-09-27 DIAGNOSIS — Y92009 Unspecified place in unspecified non-institutional (private) residence as the place of occurrence of the external cause: Secondary | ICD-10-CM

## 2020-09-27 DIAGNOSIS — I10 Essential (primary) hypertension: Secondary | ICD-10-CM | POA: Diagnosis not present

## 2020-09-27 DIAGNOSIS — J449 Chronic obstructive pulmonary disease, unspecified: Secondary | ICD-10-CM | POA: Diagnosis present

## 2020-09-27 DIAGNOSIS — D539 Nutritional anemia, unspecified: Secondary | ICD-10-CM | POA: Diagnosis present

## 2020-09-27 DIAGNOSIS — I700 Atherosclerosis of aorta: Secondary | ICD-10-CM | POA: Diagnosis not present

## 2020-09-27 DIAGNOSIS — K5520 Angiodysplasia of colon without hemorrhage: Secondary | ICD-10-CM | POA: Diagnosis not present

## 2020-09-27 DIAGNOSIS — K627 Radiation proctitis: Secondary | ICD-10-CM | POA: Diagnosis not present

## 2020-09-27 DIAGNOSIS — Z79899 Other long term (current) drug therapy: Secondary | ICD-10-CM

## 2020-09-27 DIAGNOSIS — E7800 Pure hypercholesterolemia, unspecified: Secondary | ICD-10-CM | POA: Diagnosis present

## 2020-09-27 DIAGNOSIS — Z8711 Personal history of peptic ulcer disease: Secondary | ICD-10-CM

## 2020-09-27 DIAGNOSIS — K2289 Other specified disease of esophagus: Secondary | ICD-10-CM | POA: Diagnosis not present

## 2020-09-27 DIAGNOSIS — M419 Scoliosis, unspecified: Secondary | ICD-10-CM | POA: Diagnosis present

## 2020-09-27 DIAGNOSIS — E039 Hypothyroidism, unspecified: Secondary | ICD-10-CM | POA: Diagnosis present

## 2020-09-27 DIAGNOSIS — R531 Weakness: Secondary | ICD-10-CM | POA: Diagnosis not present

## 2020-09-27 DIAGNOSIS — S72112A Displaced fracture of greater trochanter of left femur, initial encounter for closed fracture: Secondary | ICD-10-CM | POA: Diagnosis present

## 2020-09-27 DIAGNOSIS — M1711 Unilateral primary osteoarthritis, right knee: Secondary | ICD-10-CM | POA: Diagnosis not present

## 2020-09-27 DIAGNOSIS — Z923 Personal history of irradiation: Secondary | ICD-10-CM

## 2020-09-27 DIAGNOSIS — D649 Anemia, unspecified: Secondary | ICD-10-CM | POA: Diagnosis not present

## 2020-09-27 DIAGNOSIS — K8689 Other specified diseases of pancreas: Secondary | ICD-10-CM | POA: Diagnosis not present

## 2020-09-27 DIAGNOSIS — K31819 Angiodysplasia of stomach and duodenum without bleeding: Secondary | ICD-10-CM | POA: Diagnosis present

## 2020-09-27 DIAGNOSIS — Z882 Allergy status to sulfonamides status: Secondary | ICD-10-CM

## 2020-09-27 DIAGNOSIS — I2609 Other pulmonary embolism with acute cor pulmonale: Secondary | ICD-10-CM

## 2020-09-27 DIAGNOSIS — R29898 Other symptoms and signs involving the musculoskeletal system: Secondary | ICD-10-CM | POA: Diagnosis not present

## 2020-09-27 DIAGNOSIS — Z86718 Personal history of other venous thrombosis and embolism: Secondary | ICD-10-CM

## 2020-09-27 DIAGNOSIS — S72142D Displaced intertrochanteric fracture of left femur, subsequent encounter for closed fracture with routine healing: Secondary | ICD-10-CM | POA: Diagnosis not present

## 2020-09-27 DIAGNOSIS — K2100 Gastro-esophageal reflux disease with esophagitis, without bleeding: Secondary | ICD-10-CM | POA: Diagnosis present

## 2020-09-27 DIAGNOSIS — K5730 Diverticulosis of large intestine without perforation or abscess without bleeding: Secondary | ICD-10-CM | POA: Diagnosis present

## 2020-09-27 DIAGNOSIS — K219 Gastro-esophageal reflux disease without esophagitis: Secondary | ICD-10-CM | POA: Diagnosis present

## 2020-09-27 DIAGNOSIS — R296 Repeated falls: Secondary | ICD-10-CM | POA: Diagnosis not present

## 2020-09-27 DIAGNOSIS — Z85048 Personal history of other malignant neoplasm of rectum, rectosigmoid junction, and anus: Secondary | ICD-10-CM | POA: Diagnosis not present

## 2020-09-27 DIAGNOSIS — R000 Tachycardia, unspecified: Secondary | ICD-10-CM | POA: Diagnosis not present

## 2020-09-27 DIAGNOSIS — Z743 Need for continuous supervision: Secondary | ICD-10-CM | POA: Diagnosis not present

## 2020-09-27 DIAGNOSIS — R52 Pain, unspecified: Secondary | ICD-10-CM | POA: Diagnosis not present

## 2020-09-27 DIAGNOSIS — Z79890 Hormone replacement therapy (postmenopausal): Secondary | ICD-10-CM

## 2020-09-27 DIAGNOSIS — T451X5A Adverse effect of antineoplastic and immunosuppressive drugs, initial encounter: Secondary | ICD-10-CM | POA: Diagnosis present

## 2020-09-27 DIAGNOSIS — Z20822 Contact with and (suspected) exposure to covid-19: Secondary | ICD-10-CM | POA: Diagnosis present

## 2020-09-27 DIAGNOSIS — R609 Edema, unspecified: Secondary | ICD-10-CM | POA: Diagnosis not present

## 2020-09-27 DIAGNOSIS — W19XXXA Unspecified fall, initial encounter: Secondary | ICD-10-CM | POA: Diagnosis present

## 2020-09-27 DIAGNOSIS — D62 Acute posthemorrhagic anemia: Secondary | ICD-10-CM

## 2020-09-27 DIAGNOSIS — G4733 Obstructive sleep apnea (adult) (pediatric): Secondary | ICD-10-CM | POA: Diagnosis present

## 2020-09-27 DIAGNOSIS — F429 Obsessive-compulsive disorder, unspecified: Secondary | ICD-10-CM | POA: Diagnosis present

## 2020-09-27 DIAGNOSIS — K635 Polyp of colon: Secondary | ICD-10-CM | POA: Diagnosis not present

## 2020-09-27 DIAGNOSIS — R5381 Other malaise: Secondary | ICD-10-CM | POA: Diagnosis present

## 2020-09-27 DIAGNOSIS — Z7901 Long term (current) use of anticoagulants: Secondary | ICD-10-CM | POA: Diagnosis not present

## 2020-09-27 DIAGNOSIS — D509 Iron deficiency anemia, unspecified: Secondary | ICD-10-CM | POA: Diagnosis not present

## 2020-09-27 DIAGNOSIS — F329 Major depressive disorder, single episode, unspecified: Secondary | ICD-10-CM | POA: Diagnosis not present

## 2020-09-27 DIAGNOSIS — K648 Other hemorrhoids: Secondary | ICD-10-CM | POA: Diagnosis present

## 2020-09-27 DIAGNOSIS — M25569 Pain in unspecified knee: Secondary | ICD-10-CM

## 2020-09-27 DIAGNOSIS — D6959 Other secondary thrombocytopenia: Secondary | ICD-10-CM | POA: Diagnosis present

## 2020-09-27 DIAGNOSIS — Z888 Allergy status to other drugs, medicaments and biological substances status: Secondary | ICD-10-CM

## 2020-09-27 DIAGNOSIS — Z833 Family history of diabetes mellitus: Secondary | ICD-10-CM

## 2020-09-27 DIAGNOSIS — Z800 Family history of malignant neoplasm of digestive organs: Secondary | ICD-10-CM

## 2020-09-27 DIAGNOSIS — R627 Adult failure to thrive: Secondary | ICD-10-CM | POA: Diagnosis present

## 2020-09-27 DIAGNOSIS — K208 Other esophagitis without bleeding: Secondary | ICD-10-CM | POA: Diagnosis not present

## 2020-09-27 DIAGNOSIS — M6281 Muscle weakness (generalized): Secondary | ICD-10-CM | POA: Diagnosis not present

## 2020-09-27 DIAGNOSIS — M797 Fibromyalgia: Secondary | ICD-10-CM | POA: Diagnosis not present

## 2020-09-27 DIAGNOSIS — Z7401 Bed confinement status: Secondary | ICD-10-CM | POA: Diagnosis not present

## 2020-09-27 DIAGNOSIS — R6889 Other general symptoms and signs: Secondary | ICD-10-CM | POA: Diagnosis not present

## 2020-09-27 DIAGNOSIS — I82891 Chronic embolism and thrombosis of other specified veins: Secondary | ICD-10-CM | POA: Diagnosis not present

## 2020-09-27 DIAGNOSIS — K838 Other specified diseases of biliary tract: Secondary | ICD-10-CM | POA: Diagnosis not present

## 2020-09-27 DIAGNOSIS — M609 Myositis, unspecified: Secondary | ICD-10-CM | POA: Diagnosis present

## 2020-09-27 DIAGNOSIS — Z885 Allergy status to narcotic agent status: Secondary | ICD-10-CM

## 2020-09-27 DIAGNOSIS — I2690 Septic pulmonary embolism without acute cor pulmonale: Secondary | ICD-10-CM | POA: Diagnosis not present

## 2020-09-27 DIAGNOSIS — M1990 Unspecified osteoarthritis, unspecified site: Secondary | ICD-10-CM | POA: Diagnosis not present

## 2020-09-27 DIAGNOSIS — G729 Myopathy, unspecified: Secondary | ICD-10-CM | POA: Diagnosis not present

## 2020-09-27 DIAGNOSIS — I951 Orthostatic hypotension: Secondary | ICD-10-CM | POA: Diagnosis not present

## 2020-09-27 DIAGNOSIS — J439 Emphysema, unspecified: Secondary | ICD-10-CM | POA: Diagnosis not present

## 2020-09-27 LAB — CBC WITH DIFFERENTIAL/PLATELET
Abs Immature Granulocytes: 0.06 10*3/uL (ref 0.00–0.07)
Basophils Absolute: 0 10*3/uL (ref 0.0–0.1)
Basophils Relative: 0 %
Eosinophils Absolute: 0 10*3/uL (ref 0.0–0.5)
Eosinophils Relative: 0 %
HCT: 38.4 % (ref 36.0–46.0)
Hemoglobin: 13.3 g/dL (ref 12.0–15.0)
Immature Granulocytes: 1 %
Lymphocytes Relative: 6 %
Lymphs Abs: 0.5 10*3/uL — ABNORMAL LOW (ref 0.7–4.0)
MCH: 33.3 pg (ref 26.0–34.0)
MCHC: 34.6 g/dL (ref 30.0–36.0)
MCV: 96 fL (ref 80.0–100.0)
Monocytes Absolute: 0.2 10*3/uL (ref 0.1–1.0)
Monocytes Relative: 3 %
Neutro Abs: 7.2 10*3/uL (ref 1.7–7.7)
Neutrophils Relative %: 90 %
Platelets: 127 10*3/uL — ABNORMAL LOW (ref 150–400)
RBC: 4 MIL/uL (ref 3.87–5.11)
RDW: 13.4 % (ref 11.5–15.5)
WBC: 8 10*3/uL (ref 4.0–10.5)
nRBC: 0 % (ref 0.0–0.2)

## 2020-09-27 LAB — RESPIRATORY PANEL BY RT PCR (FLU A&B, COVID)
Influenza A by PCR: NEGATIVE
Influenza B by PCR: NEGATIVE
SARS Coronavirus 2 by RT PCR: NEGATIVE

## 2020-09-27 LAB — BASIC METABOLIC PANEL
Anion gap: 10 (ref 5–15)
BUN: 28 mg/dL — ABNORMAL HIGH (ref 8–23)
CO2: 29 mmol/L (ref 22–32)
Calcium: 9.2 mg/dL (ref 8.9–10.3)
Chloride: 102 mmol/L (ref 98–111)
Creatinine, Ser: 0.95 mg/dL (ref 0.44–1.00)
GFR calc non Af Amer: 60 mL/min — ABNORMAL LOW (ref >60)
Glucose, Bld: 145 mg/dL — ABNORMAL HIGH (ref 70–99)
Potassium: 3.7 mmol/L (ref 3.5–5.1)
Sodium: 141 mmol/L (ref 135–145)

## 2020-09-27 LAB — PROTIME-INR
INR: 1 (ref 0.8–1.2)
Prothrombin Time: 12.7 seconds (ref 11.4–15.2)

## 2020-09-27 LAB — CK: Total CK: 34 U/L — ABNORMAL LOW (ref 38–234)

## 2020-09-27 LAB — SEDIMENTATION RATE: Sed Rate: 21 mm/hr (ref 0–22)

## 2020-09-27 LAB — APTT: aPTT: 22 seconds — ABNORMAL LOW (ref 24–36)

## 2020-09-27 MED ORDER — IOHEXOL 350 MG/ML SOLN
100.0000 mL | Freq: Once | INTRAVENOUS | Status: AC | PRN
  Administered 2020-09-27: 100 mL via INTRAVENOUS

## 2020-09-27 MED ORDER — HEPARIN BOLUS VIA INFUSION
4000.0000 [IU] | Freq: Once | INTRAVENOUS | Status: AC
  Administered 2020-09-27: 4000 [IU] via INTRAVENOUS
  Filled 2020-09-27: qty 4000

## 2020-09-27 MED ORDER — HEPARIN (PORCINE) 25000 UT/250ML-% IV SOLN
1600.0000 [IU]/h | INTRAVENOUS | Status: DC
  Administered 2020-09-27: 1600 [IU]/h via INTRAVENOUS
  Filled 2020-09-27: qty 250

## 2020-09-27 MED ORDER — OXYCODONE HCL 5 MG PO TABS
10.0000 mg | ORAL_TABLET | ORAL | Status: DC | PRN
  Administered 2020-09-27 – 2020-10-01 (×12): 10 mg via ORAL
  Filled 2020-09-27 (×11): qty 2

## 2020-09-27 NOTE — Progress Notes (Signed)
ANTICOAGULATION CONSULT NOTE - Initial Consult  Pharmacy Consult for heparin  Indication: pulmonary embolus  Allergies  Allergen Reactions  . Prozac [Fluoxetine Hcl] Anaphylaxis  . Codeine Itching  . Morphine And Related Itching  . Sulfa Antibiotics Itching and Nausea Only    Patient Measurements:   Heparin Dosing Weight: 95.3kg  Vital Signs: Temp: 98.2 F (36.8 C) (10/07 1719) Temp Source: Oral (10/07 1719) BP: 190/108 (10/07 2200) Pulse Rate: 89 (10/07 2200)  Labs: Recent Labs    09/27/20 1738  HGB 13.3  HCT 38.4  PLT 127*  CREATININE 0.95  CKTOTAL 34*    CrCl cannot be calculated (Unknown ideal weight.).   Medical History: Past Medical History:  Diagnosis Date  . Allergic rhinitis   . Arthritis   . Bursitis    Both Shoulders  . Chronic low back pain   . Degenerative disk disease   . Depression   . Diverticulitis   . Emphysema of lung ([place])   . Fibromyalgia   . GERD (gastroesophageal reflux disease)   . History of fibromyalgia   . Hypertension   . Hypothyroidism   . Irritable bowel syndrome   . Neuralgia, post-herpetic   . Obesity, unspecified   . Obstructive sleep apnea   . OCD (obsessive compulsive disorder)   . Pain in joint, site unspecified   . Pure hypercholesterolemia   . Scoliosis   . Situational stress   . Spastic colon   . Tubular adenoma   . Uterine cancer [facility])      Assessment: 74 year old female with a history of prior rectal cancer status post chemotherapy and radiation, obesity, COPD, hypothyroidism who is presenting today after a fall at home.   CTA is positive for PE , pharmacy to dose heparin drip.  No prior AC noted Hgb WNL, Plts 127. APTT and Pt/INR ordered STAT  Goal of Therapy:  Heparin level 0.3-0.7 units/ml Monitor platelets by anticoagulation protocol:   Plan:  Heparin bolus 4000 units then start heparin drip at 1600 units/hr Heparin level in 8 hours  Daily CBC  Dolly Rias RPh 09/27/2020, 10:20  PM

## 2020-09-27 NOTE — TOC Initial Note (Signed)
Transition of Care [facility]) - Initial/Assessment Note    Patient Details  Name: Joanne Klein MRN: [id number] Date of Birth: [birth year]  [facility] CM/SW Contact:    Erenest Rasher, RN Phone Number:  [phone] 09/27/2020, 7:02 PM  Clinical Narrative:                 TOC CM spoke to pt and states he would like SNF rehab. Pt gave permission to create FL2 and fax referral to SNF rehab. Pt states she lives at home alone. Will need PT evaluation and recommendation. Pt is requesting [facility].   Expected Discharge Plan: Skilled Nursing Facility Barriers to Discharge: Continued Medical Work up   Patient Goals and CMS Choice Patient states their goals for this hospitalization and ongoing recovery are:: live alone and would prefer to try rehab CMS Medicare.gov Compare Post Acute Care list provided to:: Patient Choice offered to / list presented to : Patient  Expected Discharge Plan and Services Expected Discharge Plan: [place] In-house Referral: Clinical Social Work Discharge Planning Services: CM Consult Post Acute Care Choice: [place] Living arrangements for the past 2 months: [place]                                      Prior Living Arrangements/Services Living arrangements for the past 2 months: Single Family Home Lives with:: Self Patient language and need for interpreter reviewed:: Yes        Need for Family Participation in Patient Care: Yes (Comment) Care giver support system in place?: Yes (comment) Current home services: DME (rolling walker) Criminal Activity/Legal Involvement Pertinent to Current Situation/Hospitalization: No - Comment as needed  Activities of Daily Living      Permission Sought/Granted Permission sought to share information with : Case Manager, Facility Sport and exercise psychologist, PCP, Family Supports Permission granted to share information with : Yes, Verbal Permission  Granted  Share Information with NAME: Jamelle Rushing  Permission granted to share info w AGENCY: SNF rehab, [place] granted to share info w Relationship: daughter  Permission granted to share info w Contact Information: [phone]  Emotional Assessment       Orientation: : Oriented to Place, Oriented to Self, Oriented to  Time, Oriented to Situation   Psych Involvement: No (comment)  Admission diagnosis:  weakness Patient Active Problem List   Diagnosis Date Noted  . Pain   . Lumbar radiculopathy   . Weakness of both legs 06/19/2020  . Falls frequently 06/19/2020  . Weakness 06/19/2020  . Chronic respiratory failure with hypoxia ([place]) 12/15/2019  . Orthostatic hypotension 12/15/2019  . Numbness and tingling 12/15/2019  . Chemotherapy-induced neutropenia ([place])   . Thrombocytopenia ([place]) 10/17/2019  . 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 Routine general medical examination at a health care facility 11/19/2016  . 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 Morbid obesity ([place]) 06/26/2014  . COPD GOLD II    . Hyperlipidemia   . Arthritis 01/16/2014  . Depression 01/13/2014  . Hypothyroidism 01/13/2014  . GERD (gastroesophageal reflux disease) 01/13/2014  . OCD (obsessive compulsive disorder) 01/13/2014  . Fibromyalgia 01/13/2014   PCP:  Hoyt Koch, MD Pharmacy:   [facility] [place], [address] [place] [address] Phone: [phone] Fax: [phone]  [place] -  [address], [address] [place], [address], [place] [address] Phone: [phone] Fax: [phone]     Social Determinants of Health (SDOH) Interventions    Readmission Risk Interventions Readmission Risk Prevention Plan 06/22/2020  Transportation Screening Complete  PCP or Specialist Appt within 5-7 Days Not Complete  Not Complete comments  disposition pending  Home Care Screening Complete  Medication Review (RN CM) Referral to Pharmacy  Some recent data might be hidden

## 2020-09-27 NOTE — ED Notes (Signed)
Pt assisted to use bedpan. Pt had a large bm. Pt performed peri care herself. Pt repositioned in bed and placed on purewick at 60 mmHg.

## 2020-09-27 NOTE — Progress Notes (Signed)
Pt has a PASSR:   [id number]  CSW saw a previous screening was completed and that a diagnosis of Major Depression was indicated on a previous PASRR screening but CSW did not see any indications of a "major depression" diagnosis in the chart.  That screening was stopped short of completions and CSW completed this screening today.  CSW will continue to follow for D/C needs.  Alphonse Guild. Shalay Carder  MSW, LCSW, LCAS, CCS Transitions of Care Clinical Social Worker Care Coordination Department Ph: [phone]

## 2020-09-27 NOTE — ED Provider Notes (Signed)
[place] DEPT Provider Note   CSN: [id number] Arrival date & time: 09/27/20  1700     History Chief Complaint  Patient presents with  . Fall    Joanne Klein is a 74 y.o. female.  Patient is a 74 year old female with a history of prior rectal cancer status post chemotherapy and radiation, obesity, COPD, hypothyroidism who is presenting today after a fall at home.  Patient reports over the last 6 months she has had progressive worsening weakness in bilateral lower extremities that have resulted in multiple falls.  She reports in the last 2 weeks the weakness has become even worse and now she is unable to walk more than 1 step.  Patient reports she is able to stand when using all of her strength in her arms but can put very little weight in her legs.  Today her left foot got stuck on the floor because she was unable to lift it causing her to fall over to the left side onto her walker.  She landed on carpet and denies hitting her head or loss of consciousness.  She did not feel dizzy.  She has had no visual changes, fever, neck or upper back pain.  She has chronic low back pain but she does not feel like it is any different than baseline.  In August she was placed on a prednisone taper as there was concern that maybe radiation was causing edema of her sacral lumbar area and initially had some improvement with higher dose steroids but is still on the prednisone taper and reports she is having no benefit at this time and things are actually getting worse.  She has had to cancel several appointments with her oncologist Dr. Benay Spice because she reports she cannot get out of the house because she cannot stand or walk.  Patient reports initially she was getting some physical therapy at home but then insurance would not cover it anymore.  A neighbor is coming over to help her with meals and her niece is getting her wheelchair but right now she still just has her walker.  She  is taking oxycodone from the pain but reports the pain moves and is usually below the knees bilaterally but sometimes is also in her upper hips.  She has decreased sensation in the toes of both feet but that is unchanged.  She denies any vomiting or diarrhea.  The history is provided by the patient.  Fall This is a recurrent problem. The current episode started 1 to 2 hours ago. The problem occurs constantly. The problem has not changed since onset.Nothing aggravates the symptoms. Nothing relieves the symptoms. She has tried nothing for the symptoms.       Past Medical History:  Diagnosis Date  . Allergic rhinitis   . Arthritis   . Bursitis    Both Shoulders  . Chronic low back pain   . Degenerative disk disease   . Depression   . Diverticulitis   . Emphysema of lung ([place])   . Fibromyalgia   . GERD (gastroesophageal reflux disease)   . History of fibromyalgia   . Hypertension   . Hypothyroidism   . Irritable bowel syndrome   . Neuralgia, post-herpetic   . Obesity, unspecified   . Obstructive sleep apnea   . OCD (obsessive compulsive disorder)   . Pain in joint, site unspecified   . Pure hypercholesterolemia   . Scoliosis   . Situational stress   . Spastic colon   .  Tubular adenoma   . Uterine cancer [facility])     Patient Active Problem List   Diagnosis Date Noted  . Pain   . Lumbar radiculopathy   . Weakness of both legs 06/19/2020  . Falls frequently 06/19/2020  . Weakness 06/19/2020  . Chronic respiratory failure with hypoxia ([place]) 12/15/2019  . Orthostatic hypotension 12/15/2019  . Numbness and tingling 12/15/2019  . Chemotherapy-induced neutropenia ([place])   . Thrombocytopenia ([place]) 10/17/2019  . 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 Routine general medical examination at a health care facility 11/19/2016  . 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 Morbid obesity ([place]) 06/26/2014  . COPD GOLD II    . Hyperlipidemia   . Arthritis 01/16/2014  . Depression 01/13/2014  . Hypothyroidism  01/13/2014  . GERD (gastroesophageal reflux disease) 01/13/2014  . OCD (obsessive compulsive disorder) 01/13/2014  . Fibromyalgia 01/13/2014    Past Surgical History:  Procedure Laterality Date  . BREAST LUMPECTOMY     knot removed  . CHOLECYSTECTOMY    . HYSTEROTOMY    . RADIOLOGY WITH ANESTHESIA Bilateral 06/14/2020   Procedure: MRI WITH ANESTHESIA;  Surgeon: Radiologist, Medication, MD;  Location: [facility];  Service: Radiology;  Laterality: Bilateral;  . RADIOLOGY WITH ANESTHESIA N/A 06/21/2020   Procedure: MRI WITH ANESTHESIA;  Surgeon: Radiologist, Medication, MD;  Location: [facility];  Service: Radiology;  Laterality: N/A;  . TONSILLECTOMY    . tumor in left forearm       OB History   No obstetric history on file.     Family History  Problem Relation Age of Onset  . Stomach cancer Father   . Liver cancer Father   . Diabetes Father   . Colon cancer Neg Hx     Social History   Tobacco Use  . Smoking status: Former Smoker    Packs/day: 2.00    Years: 34.00    Pack years: 68.00    Types: Cigarettes    Quit date: 12/15/1996    Years since quitting: 23.8  . Smokeless tobacco: Never Used  . Tobacco comment: vapes x 2 years  Vaping Use  . Vaping Use: Every day  . Substances: Nicotine  Substance Use Topics  . Alcohol use: Not Currently    Alcohol/week: 0.0 standard drinks    Comment: Caffine Intake 2x's daily  . Drug use: No    Home Medications Prior to Admission medications   Medication Sig Start Date End Date Taking? Authorizing Provider  atorvastatin (LIPITOR) 20 MG tablet TAKE 1 TABLET BY MOUTH  EVERY OTHER DAY Patient taking differently: Take 20 mg by mouth 4 (four) times a week. Saturday, Sunday, Tuesday, and Thursday 02/13/20   Hoyt Koch, MD  calcium carbonate (TUMS - DOSED IN MG ELEMENTAL CALCIUM) 500 MG chewable tablet Chew 1 tablet by mouth daily as needed.     [provider]  Cholecalciferol (VITAMIN D-3) 1000 UNITS CAPS Take 1,000 Units  by mouth daily.     [provider]  levothyroxine (SYNTHROID) 75 MCG tablet TAKE 1 TABLET BY MOUTH  DAILY BEFORE BREAKFAST Patient taking differently: Take 75 mcg by mouth daily before breakfast.  02/07/20   Hoyt Koch, MD  omeprazole (PRILOSEC) 40 MG capsule TAKE 1 CAPSULE BY MOUTH  DAILY Patient taking differently: Take 40 mg by mouth daily.  02/07/20   Hoyt Koch, MD  ondansetron (ZOFRAN) 8 MG tablet Take 8 mg by mouth every 8 (eight) hours as needed for nausea or vomiting.    [provider]  oxyCODONE-acetaminophen (PERCOCET) 10-325 MG tablet Take 1 tablet by mouth every 4 (four) hours. Gets from another provider @@ 180 per month     [provider]  phenylephrine-shark liver oil-mineral oil-petrolatum (PREPARATION H) 0.25-3-14-71.9 % rectal ointment Place 1 application rectally 2 (two) times daily as needed for hemorrhoids.    [provider]  Polyethylene Glycol 3350 (MIRALAX PO) Take 17 g by mouth at bedtime.     [provider]  predniSONE (DELTASONE) 20 MG tablet Take 1.5 tablets (30 mg total) by mouth daily with breakfast. 09/13/20   Ladell Pier, MD  prochlorperazine (COMPAZINE) 10 MG tablet Take 10 mg by mouth every 6 (six) hours as needed for nausea or vomiting.    [provider]  sertraline (ZOLOFT) 100 MG tablet TAKE 2 TABLETS BY MOUTH AT  BEDTIME Patient taking differently: Take 200 mg by mouth every evening.  02/07/20   Hoyt Koch, MD    Allergies    Prozac [fluoxetine hcl], Codeine, Morphine and related, and Sulfa antibiotics  Review of Systems   Review of Systems  All other systems reviewed and are negative.   Physical Exam Updated Vital Signs BP (!) 174/115 (BP Location: Left Arm)   Pulse 100   Temp 98.2 F (36.8 C) (Oral)   LMP  (LMP Unknown)   SpO2 95%   Physical Exam Vitals and nursing note reviewed.  Constitutional:      General: She is not in acute distress.     Appearance: Normal appearance. She is obese.  HENT:     Head: Normocephalic and atraumatic.     Nose: Nose normal.     Mouth/Throat:     Mouth: Mucous membranes are moist.  Eyes:     Extraocular Movements: Extraocular movements intact.     Pupils: Pupils are equal, round, and reactive to light.  Cardiovascular:     Rate and Rhythm: Regular rhythm. Tachycardia present.     Pulses: Normal pulses.  Pulmonary:     Effort: Pulmonary effort is normal. No respiratory distress.     Breath sounds: Normal breath sounds. No wheezing, rhonchi or rales.  Chest:     Chest wall: No tenderness.  Abdominal:     General: Abdomen is flat. Bowel sounds are normal. There is no distension.     Palpations: Abdomen is soft.     Tenderness: There is no abdominal tenderness. There is no guarding or rebound.  Musculoskeletal:        General: Tenderness present.     Cervical back: Normal range of motion and neck supple. No tenderness.       Legs:     Comments: Ecchymosis in various stages of healing scattered over bilateral lower extremities.  No significant edema noted  Lymphadenopathy:     Cervical: No cervical adenopathy.  Neurological:     Mental Status: She is alert.     Comments: Feet are dorsiflexed.  No clonus.  Possible mild Babinski on the left.  2 out of 5 strength in bilateral hip flexors and plantar and dorsiflexion.  Decreased sensation in stocking distribution but normal sensation in the mid and upper legs.  Bilateral upper extremities with 5 out of 5 strength and normal sensation  Psychiatric:        Mood and Affect: Affect is flat.        Behavior: Behavior normal.        Thought Content: Thought content normal.     ED  Results / Procedures / Treatments   Labs (all labs ordered are listed, but only abnormal results are displayed) Labs Reviewed  CBC WITH DIFFERENTIAL/PLATELET - Abnormal; Notable for the following components:      Result Value   Platelets 127 (*)    Lymphs Abs 0.5 (*)     All other components within normal limits  BASIC METABOLIC PANEL - Abnormal; Notable for the following components:   Glucose, Bld 145 (*)    BUN 28 (*)    GFR calc non Af Amer 60 (*)    All other components within normal limits  CK - Abnormal; Notable for the following components:   Total CK 34 (*)    All other components within normal limits  RESPIRATORY PANEL BY RT PCR (FLU A&B, COVID)  SEDIMENTATION RATE  C-REACTIVE PROTEIN    EKG None  Radiology DG Chest Port 1 View  Result Date: 09/27/2020 CLINICAL DATA:  Weakness EXAM: PORTABLE CHEST 1 VIEW COMPARISON:  11/16/2019 FINDINGS: The heart size and mediastinal contours are within normal limits. Both lungs are clear. The visualized skeletal structures are unremarkable. Aortic atherosclerosis. IMPRESSION: No active disease. Electronically Signed   By: Donavan Foil M.D.   On: 09/27/2020 18:32    Procedures Procedures (including critical care time)  Medications Ordered in ED Medications  oxyCODONE (Oxy IR/ROXICODONE) immediate release tablet 10 mg (has no administration in time range)    ED Course  I have reviewed the triage vital signs and the nursing notes.  Pertinent labs & imaging results that were available during my care of the patient were reviewed by me and considered in my medical decision making (see chart for details).    MDM Rules/Calculators/A&P                          Patient presenting with worsening bilateral lower extremity weakness.  She is having more falls at home and now is unable to walk with a walker because she cannot step more than 1 step without her legs giving out.  She reports her legs feel very heavy.  On exam she has 2 out of 5 strength in bilateral lower extremities but no clonus noted.  She also has decreased reflexes.  She has had multiple MRIs of her thoracic spine, lumbar spine, pelvis most recently was approximately 2 months ago and at that time showed inflammatory changes in the  lumbosacral area which was possibly from recent radiation for anal cancer but also possibility for myositis.  She was started on a prednisone taper a few months ago which initially helped but now is not working anymore.  Patient is not getting any in-home services but her niece is going to get her a wheelchair.  She does live alone.  Will consult transitional care team.  However will send labs but will discuss with neurology feel that patient most likely needs another MRI.  8:33 PM Patient's labs are reassuring and normal.  Spoke with [name] with neurology and he did not feel that an MRI would provide more information and feel that what ever is going on is the same that was going on in July and most likely worsened from patient being deconditioned.  Findings discussed with the patient.  She wishes to be placed for rehab as she is not doing well at home.  Spoke with transitional care team who reports that she will need PT evaluation in the morning and will most likely go tomorrow.  However while patient has been here she has also been persistently tachycardic and borderline hypoxic.  She has been extremely inactive over the last several months and with her history of recent cancer and immobility concern for PE.  We will do a CTA to evaluate.  9:45 PM Pt CTA is positive for PE and the explanation of the above sx.  Pt on 2L of O2.  Will start heparin and admit.  CRITICAL CARE Performed by: Caetano Oberhaus Total critical care time: 30 minutes Critical care time was exclusive of separately billable procedures and treating other patients. Critical care was necessary to treat or prevent imminent or life-threatening deterioration. Critical care was time spent personally by me on the following activities: development of treatment plan with patient and/or surrogate as well as nursing, discussions with consultants, evaluation of patient's response to treatment, examination of patient, obtaining history from  patient or surrogate, ordering and performing treatments and interventions, ordering and review of laboratory studies, ordering and review of radiographic studies, pulse oximetry and re-evaluation of patient's condition.  MDM Number of Diagnoses or Management Options   Amount and/or Complexity of Data Reviewed Clinical lab tests: ordered and reviewed Tests in the radiology section of CPT: ordered and reviewed Tests in the medicine section of CPT: ordered and reviewed Decide to obtain previous medical records or to obtain history from someone other than the patient: yes Obtain history from someone other than the patient: yes Review and summarize past medical records: yes Discuss the patient with other providers: yes Independent visualization of images, tracings, or specimens: yes  Risk of Complications, Morbidity, and/or Mortality Presenting problems: high Diagnostic procedures: moderate Management options: moderate  Patient Progress Patient progress: stable     Final Clinical Impression(s) / ED Diagnoses Final diagnoses:  PE (pulmonary thromboembolism) ([place])  Weakness of both lower extremities  Severe muscle deconditioning    Rx / DC Orders ED Discharge Orders    None       Blanchie Dessert, MD 09/27/20 2147

## 2020-09-27 NOTE — NC FL2 (Signed)
[place] LEVEL OF CARE SCREENING TOOL     IDENTIFICATION  Patient Name: Joanne Klein Birthdate: Sep 12, 1946 Sex: female Admission Date (Current Location): 09/27/2020  [facility] and [place] Number:  Herbalist and Address:  [facility],  [place] [address], [place]      Provider Number: [id number]  Attending Physician Name and Address:  Blanchie Dessert, MD  Relative Name and Phone Number:       Current Level of Care: Hospital Recommended Level of Care: [place] Prior Approval Number:    Date Approved/Denied: 09/27/20 PASRR Number: [id number]  Discharge Plan: SNF    Current Diagnoses: Patient Active Problem List   Diagnosis Date Noted  . Pain   . Lumbar radiculopathy   . Weakness of both legs 06/19/2020  . Falls frequently 06/19/2020  . Weakness 06/19/2020  . Chronic respiratory failure with hypoxia ([place]) 12/15/2019  . Orthostatic hypotension 12/15/2019  . Numbness and tingling 12/15/2019  . Chemotherapy-induced neutropenia ([place])   . Thrombocytopenia ([place]) 10/17/2019  . Anal cancer ([place]) 09/15/2019  . Routine general medical examination at a health care facility 11/19/2016  . 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 Morbid obesity ([place]) 06/26/2014  . COPD GOLD II    . Hyperlipidemia   . Arthritis 01/16/2014  . Depression 01/13/2014  . Hypothyroidism 01/13/2014  . GERD (gastroesophageal reflux disease) 01/13/2014  . OCD (obsessive compulsive disorder) 01/13/2014  . Fibromyalgia 01/13/2014    Orientation RESPIRATION BLADDER Height & Weight     Self, Time, Situation, Place  O2 (2 liters nasal cannula) Incontinent Weight:   Height:     BEHAVIORAL SYMPTOMS/MOOD NEUROLOGICAL BOWEL NUTRITION STATUS      Incontinent Diet (Heart Healthy)  AMBULATORY STATUS COMMUNICATION OF NEEDS Skin   Limited Assist Verbally Normal                       Personal Care Assistance Level of Assistance  Bathing, Dressing Bathing  Assistance: Limited assistance   Dressing Assistance: Limited assistance     Functional Limitations Info             SPECIAL CARE FACTORS FREQUENCY  PT (By licensed PT), OT (By licensed OT)     PT Frequency: 5 OT Frequency: 5            Contractures Contractures Info: Not present    Additional Factors Info  Allergies, Code Status Code Status Info: DNR, per chart Allergies Info: Prozac (Fluoxetine Hc), Codeine, Morphine And Related, Sulfa Antibiotics           Current Medications (09/27/2020):  This is the current hospital active medication list Current Facility-Administered Medications  Medication Dose Route Frequency Provider Last Rate Last Admin  . oxyCODONE (Oxy IR/ROXICODONE) immediate release tablet 10 mg  10 mg Oral Q4H PRN Blanchie Dessert, MD   10 mg at 09/27/20 1755   Current Outpatient Medications  Medication Sig Dispense Refill  . atorvastatin (LIPITOR) 20 MG tablet TAKE 1 TABLET BY MOUTH  EVERY OTHER DAY (Patient taking differently: Take 20 mg by mouth 4 (four) times a week. Saturday, Sunday, Tuesday, and Thursday) 45 tablet 3  . calcium carbonate (TUMS - DOSED IN MG ELEMENTAL CALCIUM) 500 MG chewable tablet Chew 1 tablet by mouth daily as needed for indigestion or heartburn.     . Cholecalciferol (VITAMIN D-3) 1000 UNITS CAPS Take 1,000 Units by mouth daily.     Marland Kitchen levothyroxine (SYNTHROID) 75 MCG tablet TAKE 1  TABLET BY MOUTH  DAILY BEFORE BREAKFAST (Patient taking differently: Take 75 mcg by mouth daily before breakfast. ) 90 tablet 3  . omeprazole (PRILOSEC) 40 MG capsule TAKE 1 CAPSULE BY MOUTH  DAILY (Patient taking differently: Take 40 mg by mouth daily. ) 90 capsule 3  . ondansetron (ZOFRAN) 8 MG tablet Take 8 mg by mouth every 8 (eight) hours as needed for nausea or vomiting.    Marland Kitchen oxyCODONE-acetaminophen (PERCOCET) 10-325 MG tablet Take 1 tablet by mouth every 4 (four) hours.     . phenylephrine-shark liver oil-mineral oil-petrolatum (PREPARATION H)  0.25-3-14-71.9 % rectal ointment Place 1 application rectally 2 (two) times daily as needed for hemorrhoids.    . Polyethylene Glycol 3350 (MIRALAX PO) Take 17 g by mouth at bedtime.     . predniSONE (DELTASONE) 20 MG tablet Take 1.5 tablets (30 mg total) by mouth daily with breakfast. 45 tablet 0  . sertraline (ZOLOFT) 100 MG tablet TAKE 2 TABLETS BY MOUTH AT  BEDTIME (Patient taking differently: Take 200 mg by mouth every evening. ) 180 tablet 3  . prochlorperazine (COMPAZINE) 10 MG tablet Take 10 mg by mouth every 6 (six) hours as needed for nausea or vomiting.       Discharge Medications: Please see discharge summary for a list of discharge medications.  Relevant Imaging Results:  Relevant Lab Results:   Additional Information [id number]  Alphonse Guild Samamtha Tiegs, LCSW

## 2020-09-27 NOTE — H&P (Signed)
History and Physical   Joanne Klein:[id number] DOB: [birth year] DOA: 09/27/2020  Referring MD/NP/PA: Dr. Maryan Rued  PCP: Hoyt Koch, MD   Outpatient Specialists: Dr. Betsy Coder, oncology  Patient coming from: Home  Chief Complaint: Fall and weakness  HPI: Joanne Klein is a 74 y.o. female with medical history significant of unknown rectal cancer status post chemotherapy and radiation therapy who developed significant myopathy following treatment and has significant lower extremity weakness neuralgia and known degenerative disc disease of the lumbar spine GERD hypertension fibromyalgia overall weakness with poor mobility, obstructive compulsive disorder, morbid obesity as well as obstructive sleep apnea, hypertension hypothyroidism patient came into the ER with worsening weakness as well as another fall.  She was evaluated and was being admitted with failure to thrive at home due to weakness with plan for possible PT OT and placement.  While being evaluated patient was noted to be hypoxic and also tachycardic.  Follow up work-up with CT angiogram of the chest showed pulmonary embolism.  Patient has been placed on oxygen and is being treated and admitted for pulmonary embolism in addition to her chronic problems.  Denied any fever or chills denied any sick contacts.  COVID-19 negative.  Patient has been vaccinated..  ED Course: Temperature 98.2 blood pressure 198/101 initially pulse 100 respirate of 18 oxygen sat 88% on room air.  Platelets 127 otherwise CBC within normal.  BUN 28 otherwise chemistry also appears to be within normal.  COVID-19 screen is negative.  Chest x-ray showed no active disease.  CT angiogram of the chest shows positive for acute left-sided PE with CT evidence of right heart strain consistent with left submassive PE.  Evidence of emphysema.  Patient being admitted to the hospital therefore for further evaluation and treatment.  Review of Systems: As per HPI  otherwise 10 point review of systems negative.    Past Medical History:  Diagnosis Date  . Allergic rhinitis   . Arthritis   . Bursitis    Both Shoulders  . Chronic low back pain   . Degenerative disk disease   . Depression   . Diverticulitis   . Emphysema of lung ([place])   . Fibromyalgia   . GERD (gastroesophageal reflux disease)   . History of fibromyalgia   . Hypertension   . Hypothyroidism   . Irritable bowel syndrome   . Neuralgia, post-herpetic   . Obesity, unspecified   . Obstructive sleep apnea   . OCD (obsessive compulsive disorder)   . Pain in joint, site unspecified   . Pure hypercholesterolemia   . Scoliosis   . Situational stress   . Spastic colon   . Tubular adenoma   . Uterine cancer [facility])     Past Surgical History:  Procedure Laterality Date  . BREAST LUMPECTOMY     knot removed  . CHOLECYSTECTOMY    . HYSTEROTOMY    . RADIOLOGY WITH ANESTHESIA Bilateral 06/14/2020   Procedure: MRI WITH ANESTHESIA;  Surgeon: Radiologist, Medication, MD;  Location: [place];  Service: Radiology;  Laterality: Bilateral;  . RADIOLOGY WITH ANESTHESIA N/A 06/21/2020   Procedure: MRI WITH ANESTHESIA;  Surgeon: Radiologist, Medication, MD;  Location: [place];  Service: Radiology;  Laterality: N/A;  . TONSILLECTOMY    . tumor in left forearm       reports that she quit smoking about 23 years ago. Her smoking use included cigarettes. She has a 68.00 pack-year smoking history. She has never used smokeless tobacco. She reports previous alcohol  use. She reports that she does not use drugs.  Allergies  Allergen Reactions  . Prozac [Fluoxetine Hcl] Anaphylaxis  . Codeine Itching  . Morphine And Related Itching  . Sulfa Antibiotics Itching and Nausea Only    Family History  Problem Relation Age of Onset  . Stomach cancer Father   . Liver cancer Father   . Diabetes Father   . Colon cancer Neg Hx      Prior to Admission medications   Medication Sig Start Date End Date Taking?  Authorizing Provider  atorvastatin (LIPITOR) 20 MG tablet TAKE 1 TABLET BY MOUTH  EVERY OTHER DAY Patient taking differently: Take 20 mg by mouth 4 (four) times a week. Saturday, Sunday, Tuesday, and Thursday 02/13/20  Yes Hoyt Koch, MD  calcium carbonate (TUMS - DOSED IN MG ELEMENTAL CALCIUM) 500 MG chewable tablet Chew 1 tablet by mouth daily as needed for indigestion or heartburn.    Yes [provider]  Cholecalciferol (VITAMIN D-3) 1000 UNITS CAPS Take 1,000 Units by mouth daily.    Yes [provider]  levothyroxine (SYNTHROID) 75 MCG tablet TAKE 1 TABLET BY MOUTH  DAILY BEFORE BREAKFAST Patient taking differently: Take 75 mcg by mouth daily before breakfast.  02/07/20  Yes Hoyt Koch, MD  omeprazole (PRILOSEC) 40 MG capsule TAKE 1 CAPSULE BY MOUTH  DAILY Patient taking differently: Take 40 mg by mouth daily.  02/07/20  Yes Hoyt Koch, MD  ondansetron (ZOFRAN) 8 MG tablet Take 8 mg by mouth every 8 (eight) hours as needed for nausea or vomiting.   Yes [provider]  oxyCODONE-acetaminophen (PERCOCET) 10-325 MG tablet Take 1 tablet by mouth every 4 (four) hours.    Yes [provider]  phenylephrine-shark liver oil-mineral oil-petrolatum (PREPARATION H) 0.25-3-14-71.9 % rectal ointment Place 1 application rectally 2 (two) times daily as needed for hemorrhoids.   Yes [provider]  Polyethylene Glycol 3350 (MIRALAX PO) Take 17 g by mouth at bedtime.    Yes [provider]  predniSONE (DELTASONE) 20 MG tablet Take 1.5 tablets (30 mg total) by mouth daily with breakfast. 09/13/20  Yes Ladell Pier, MD  sertraline (ZOLOFT) 100 MG tablet TAKE 2 TABLETS BY MOUTH AT  BEDTIME Patient taking differently: Take 200 mg by mouth every evening.  02/07/20  Yes Hoyt Koch, MD  prochlorperazine (COMPAZINE) 10 MG tablet Take 10 mg by mouth every 6 (six) hours as needed for nausea or vomiting.    [provider]    Physical Exam: Vitals:   09/27/20 2000 09/27/20 2039 09/27/20 2100 09/27/20 2200  BP: (!) 178/125  (!) 198/101 (!) 190/108  Pulse: 90  88 89  Resp: 15  14 16   Temp:      TempSrc:      SpO2: 91% 95% 95% 97%      Constitutional: Chronically ill looking, obese, NAD Vitals:   09/27/20 2000 09/27/20 2039 09/27/20 2100 09/27/20 2200  BP: (!) 178/125  (!) 198/101 (!) 190/108  Pulse: 90  88 89  Resp: 15  14 16   Temp:      TempSrc:      SpO2: 91% 95% 95% 97%   Eyes: PERRL, lids and conjunctivae normal ENMT: Mucous membranes are moist. Posterior pharynx clear of any exudate or lesions.Normal dentition.  Neck: normal, supple, no masses, no thyromegaly Respiratory: Coarse BS bilaterally, no wheezing, no crackles. Normal respiratory effort. No accessory muscle use.  Cardiovascular: Sinus Tachycardia, no murmurs /  rubs / gallops. Trace extremity edema. 2+ pedal pulses. No carotid bruits.  Abdomen: no tenderness, no masses palpated. No hepatosplenomegaly. Bowel sounds positive.  Musculoskeletal: no clubbing / cyanosis. No joint deformity upper and lower extremities. Decreased muscle tone, Good ROM, mild contractures. Normal muscle tone.  Skin: no rashes, lesions, ulcers. No induration Neurologic: CN 2-12 grossly intact. Paraesthesia, tender LE, DTR normal. Strength 5/5 in all 4.  Psychiatric: Normal judgment and insight. Alert and oriented x 3. Normal mood.     Labs on Admission: I have personally reviewed following labs and imaging studies  CBC: Recent Labs  Lab 09/27/20 1738  WBC 8.0  NEUTROABS 7.2  HGB 13.3  HCT 38.4  MCV 96.0  PLT 093*   Basic Metabolic Panel: Recent Labs  Lab 09/27/20 1738  NA 141  K 3.7  CL 102  CO2 29  GLUCOSE 145*  BUN 28*  CREATININE 0.95  CALCIUM 9.2   GFR: CrCl cannot be calculated (Unknown ideal weight.). Liver Function Tests: No results for input(s): AST, ALT, ALKPHOS, BILITOT, PROT, ALBUMIN in the last 168  hours. No results for input(s): LIPASE, AMYLASE in the last 168 hours. No results for input(s): AMMONIA in the last 168 hours. Coagulation Profile: No results for input(s): INR, PROTIME in the last 168 hours. Cardiac Enzymes: Recent Labs  Lab 09/27/20 1738  CKTOTAL 34*   BNP (last 3 results) No results for input(s): PROBNP in the last 8760 hours. HbA1C: No results for input(s): HGBA1C in the last 72 hours. CBG: No results for input(s): GLUCAP in the last 168 hours. Lipid Profile: No results for input(s): CHOL, HDL, LDLCALC, TRIG, CHOLHDL, LDLDIRECT in the last 72 hours. Thyroid Function Tests: No results for input(s): TSH, T4TOTAL, FREET4, T3FREE, THYROIDAB in the last 72 hours. Anemia Panel: No results for input(s): VITAMINB12, FOLATE, FERRITIN, TIBC, IRON, RETICCTPCT in the last 72 hours. Urine analysis:    Component Value Date/Time   COLORURINE YELLOW 12/27/2019 1018   APPEARANCEUR CLEAR 12/27/2019 1018   LABSPEC 1.011 12/27/2019 1018   PHURINE 7.0 12/27/2019 1018   GLUCOSEU NEGATIVE 12/27/2019 1018   HGBUR NEGATIVE 12/27/2019 [place] 12/27/2019 1018   KETONESUR NEGATIVE 12/27/2019 1018   PROTEINUR NEGATIVE 12/27/2019 1018   UROBILINOGEN 1.0 04/20/2008 0800   NITRITE NEGATIVE 12/27/2019 1018   LEUKOCYTESUR TRACE (A) 12/27/2019 1018   Sepsis Labs: @LABRCNTIP (procalcitonin:4,lacticidven:4) )No results found for this or any previous visit (from the past 240 hour(s)).   Radiological Exams on Admission: CT Angio Chest PE W and/or Wo Contrast  Result Date: 09/27/2020 CLINICAL DATA:  Weakness fell in bathroom EXAM: CT ANGIOGRAPHY CHEST WITH CONTRAST TECHNIQUE: Multidetector CT imaging of the chest was performed using the standard protocol during bolus administration of intravenous contrast. Multiplanar CT image reconstructions and MIPs were obtained to evaluate the vascular anatomy. CONTRAST:  14mL OMNIPAQUE IOHEXOL 350 MG/ML SOLN COMPARISON:  Chest  x-ray 09/27/2020, CT chest 10/12/2019 FINDINGS: Cardiovascular: Satisfactory opacification of the pulmonary arteries to the segmental level. Acute thrombus straddling left upper lobe pulmonary vessel to the descending left lower lobe pulmonary artery. Extension of thrombus into left upper and left lower lobe segmental vessels. No definitive right-sided emboli. Elevation of RV LV ratio which measures 1.3. Mild cardiomegaly. No pericardial effusion. Moderate aortic atherosclerosis. Ectatic ascending aorta without aneurysm. Mild coronary vascular calcification. No pericardial effusion. Mediastinum/Nodes: Midline trachea. Stable partially calcified subcentimeter nodule left lobe of thyroid, no further workup recommended. No suspicious adenopathy. Esophagus within normal limits. Lungs/Pleura: Moderate  emphysema. No acute consolidation or effusion. No pneumothorax. Upper Abdomen: Calcified granuloma in the spleen. Status post cholecystectomy. No acute abnormality Musculoskeletal: No chest wall abnormality. No acute or significant osseous findings. Review of the MIP images confirms the above findings. IMPRESSION: 1. Positive for acute left-sided pulmonary emboli. Positive for acute PE with CT evidence of right heart strain (RV/LV Ratio = 1.3) consistent with at least submassive (intermediate risk) PE. The presence of right heart strain has been associated with an increased risk of morbidity and mortality. 2. Emphysema. No acute consolidation or effusion. 3. Emphysema and aortic atherosclerosis. Critical Value/emergent results were called by telephone at the time of interpretation on 09/27/2020 at 9:43 pm to provider [facility] , who verbally acknowledged these results. Aortic Atherosclerosis (ICD10-I70.0) and Emphysema (ICD10-J43.9). Electronically Signed   By: Donavan Foil M.D.   On: 09/27/2020 21:43   DG Chest Port 1 View  Result Date: 09/27/2020 CLINICAL DATA:  Weakness EXAM: PORTABLE CHEST 1 VIEW COMPARISON:   11/16/2019 FINDINGS: The heart size and mediastinal contours are within normal limits. Both lungs are clear. The visualized skeletal structures are unremarkable. Aortic atherosclerosis. IMPRESSION: No active disease. Electronically Signed   By: Donavan Foil M.D.   On: 09/27/2020 18:32    EKG: Independently reviewed.  Sinus tachycardia  Assessment/Plan Principal Problem:   Pulmonary embolism (HCC) Active Problems:   Depression   Hypothyroidism   GERD (gastroesophageal reflux disease)   COPD GOLD II    Hyperlipidemia   Morbid obesity ([place])   Anal cancer (HCC)   Chronic respiratory failure with hypoxia (HCC)   Weakness of both legs   Falls frequently     #1 pulmonary embolism: Most likely due to sedentary life that the patient has been going through lately.  Initiated on IV heparin drip.  We will transition to oral Eliquis or Xarelto as tolerated.  May consider up echocardiogram.  #2 lower extremity weakness: Secondary to some form of myositis.  Patient currently on steroids.  She has had chronic tingling and no bleeding of the lower extremity.  Continue home regimen.  PT OT when stable.  Will check Doppler ultrasound of the lower extremity to rule out DVTs.  #3 history of an rectal cancer: Has completed chemo and radiation apparently.  Continue scheduled follow-ups with oncology.  #4 history of COPD with chronic respiratory failure: Patient required oxygen at this point.  No wheezing no rales no exacerbation.  #5 hypothyroidism: Resume on continue home regimen.  #6 generalized weakness: Due to chronic disease.  Again will get PT and OT when patient is better.  #7 GERD: Continue with PPIs  #8 morbid obesity: Dietary counseling.  #9 recurrent falls: Due to muscle weakness.  Long-term PT OT and possible rehab.  #10 depression: Resume home regimen.  #11 hyperlipidemia: Continue home regimen.   DVT prophylaxis: Heparin drip Code Status: DNR Family Communication: No family at  bedside Disposition Plan: To be determined Consults called: None Admission status: Inpatient  Severity of Illness: The appropriate patient status for this patient is INPATIENT. Inpatient status is judged to be reasonable and necessary in order to provide the required intensity of service to ensure the patient's safety. The patient's presenting symptoms, physical exam findings, and initial radiographic and laboratory data in the context of their chronic comorbidities is felt to place them at high risk for further clinical deterioration. Furthermore, it is not anticipated that the patient will be medically stable for discharge from the hospital within 2 midnights of admission.  The following factors support the patient status of inpatient.   " The patient's presenting symptoms include generalized weakness. " The worrisome physical exam findings include hypoxia and tachycardia. " The initial radiographic and laboratory data are worrisome because of presence of pulmonary embolism. 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   * I certify that at the point of admission it is my clinical judgment that the patient will require inpatient hospital care spanning beyond 2 midnights from the point of admission due to high intensity of service, high risk for further deterioration and high frequency of surveillance required.Barbette Merino MD [facility] Pager [phone]  If 7PM-7AM, please contact night-coverage [email or web address] Password [email or web address]  09/27/2020, 10:58 PM

## 2020-09-27 NOTE — ED Triage Notes (Signed)
Trip and fall in bathroom. Weakness in legs x 2 months. Denies head injur. Denies loc. Denies blood thinners. Skin tear on left shin

## 2020-09-28 ENCOUNTER — Inpatient Hospital Stay (HOSPITAL_COMMUNITY): Payer: Medicare Other

## 2020-09-28 ENCOUNTER — Other Ambulatory Visit: Payer: Self-pay

## 2020-09-28 DIAGNOSIS — C210 Malignant neoplasm of anus, unspecified: Secondary | ICD-10-CM

## 2020-09-28 DIAGNOSIS — I2699 Other pulmonary embolism without acute cor pulmonale: Secondary | ICD-10-CM

## 2020-09-28 DIAGNOSIS — J9601 Acute respiratory failure with hypoxia: Secondary | ICD-10-CM | POA: Diagnosis not present

## 2020-09-28 LAB — CBC
HCT: 35 % — ABNORMAL LOW (ref 36.0–46.0)
Hemoglobin: 12.2 g/dL (ref 12.0–15.0)
MCH: 33.6 pg (ref 26.0–34.0)
MCHC: 34.9 g/dL (ref 30.0–36.0)
MCV: 96.4 fL (ref 80.0–100.0)
Platelets: 119 10*3/uL — ABNORMAL LOW (ref 150–400)
RBC: 3.63 MIL/uL — ABNORMAL LOW (ref 3.87–5.11)
RDW: 13.4 % (ref 11.5–15.5)
WBC: 7.2 10*3/uL (ref 4.0–10.5)
nRBC: 0 % (ref 0.0–0.2)

## 2020-09-28 LAB — ECHOCARDIOGRAM COMPLETE
Area-P 1/2: 3.23 cm2
Calc EF: 66.6 %
Height: 66 in
Single Plane A2C EF: 65.9 %
Single Plane A4C EF: 66.7 %
Weight: 3520 oz

## 2020-09-28 LAB — COMPREHENSIVE METABOLIC PANEL
ALT: 15 U/L (ref 0–44)
AST: 14 U/L — ABNORMAL LOW (ref 15–41)
Albumin: 3.5 g/dL (ref 3.5–5.0)
Alkaline Phosphatase: 60 U/L (ref 38–126)
Anion gap: 9 (ref 5–15)
BUN: 28 mg/dL — ABNORMAL HIGH (ref 8–23)
CO2: 29 mmol/L (ref 22–32)
Calcium: 8.8 mg/dL — ABNORMAL LOW (ref 8.9–10.3)
Chloride: 103 mmol/L (ref 98–111)
Creatinine, Ser: 0.8 mg/dL (ref 0.44–1.00)
GFR calc non Af Amer: >60 mL/min (ref >60)
Glucose, Bld: 98 mg/dL (ref 70–99)
Potassium: 3.3 mmol/L — ABNORMAL LOW (ref 3.5–5.1)
Sodium: 141 mmol/L (ref 135–145)
Total Bilirubin: 1.5 mg/dL — ABNORMAL HIGH (ref 0.3–1.2)
Total Protein: 5.9 g/dL — ABNORMAL LOW (ref 6.5–8.1)

## 2020-09-28 LAB — HEPARIN LEVEL (UNFRACTIONATED)
Heparin Unfractionated: 1.34 IU/mL — ABNORMAL HIGH (ref 0.30–0.70)
Heparin Unfractionated: 0.6 IU/mL (ref 0.30–0.70)

## 2020-09-28 LAB — TSH: TSH: 3.125 u[IU]/mL (ref 0.350–4.500)

## 2020-09-28 MED ORDER — VITAMIN D3 25 MCG (1000 UNIT) PO TABS
1000.0000 [IU] | ORAL_TABLET | Freq: Every day | ORAL | Status: DC
  Administered 2020-09-28 – 2020-10-10 (×13): 1000 [IU] via ORAL
  Filled 2020-09-28 (×14): qty 1

## 2020-09-28 MED ORDER — POTASSIUM CHLORIDE CRYS ER 20 MEQ PO TBCR
40.0000 meq | EXTENDED_RELEASE_TABLET | Freq: Once | ORAL | Status: AC
  Administered 2020-09-28: 40 meq via ORAL
  Filled 2020-09-28: qty 2

## 2020-09-28 MED ORDER — OXYCODONE HCL 5 MG PO TABS
5.0000 mg | ORAL_TABLET | ORAL | Status: DC
  Administered 2020-09-28 – 2020-09-29 (×8): 5 mg via ORAL
  Filled 2020-09-28 (×8): qty 1

## 2020-09-28 MED ORDER — POLYETHYLENE GLYCOL 3350 17 G PO PACK
17.0000 g | PACK | Freq: Every day | ORAL | Status: DC
  Administered 2020-10-01 – 2020-10-03 (×3): 17 g via ORAL
  Filled 2020-09-28 (×5): qty 1

## 2020-09-28 MED ORDER — ONDANSETRON HCL 4 MG/2ML IJ SOLN
4.0000 mg | Freq: Four times a day (QID) | INTRAMUSCULAR | Status: DC | PRN
  Administered 2020-09-29 – 2020-10-01 (×2): 4 mg via INTRAVENOUS
  Filled 2020-09-28 (×2): qty 2

## 2020-09-28 MED ORDER — PROCHLORPERAZINE MALEATE 10 MG PO TABS: 10.0000 mg | ORAL_TABLET | Freq: Four times a day (QID) | ORAL | Status: DC | PRN

## 2020-09-28 MED ORDER — PANTOPRAZOLE SODIUM 40 MG PO TBEC
40.0000 mg | DELAYED_RELEASE_TABLET | Freq: Every day | ORAL | Status: DC
  Administered 2020-09-28 – 2020-09-29 (×2): 40 mg via ORAL
  Filled 2020-09-28 (×2): qty 1

## 2020-09-28 MED ORDER — SERTRALINE HCL 100 MG PO TABS
200.0000 mg | ORAL_TABLET | Freq: Every evening | ORAL | Status: DC
  Administered 2020-09-28 – 2020-10-10 (×13): 200 mg via ORAL
  Filled 2020-09-28 (×13): qty 2

## 2020-09-28 MED ORDER — OXYCODONE-ACETAMINOPHEN 5-325 MG PO TABS
1.0000 | ORAL_TABLET | ORAL | Status: DC
  Administered 2020-09-28 – 2020-09-29 (×8): 1 via ORAL
  Filled 2020-09-28 (×8): qty 1

## 2020-09-28 MED ORDER — LEVOTHYROXINE SODIUM 50 MCG PO TABS
75.0000 ug | ORAL_TABLET | Freq: Every day | ORAL | Status: DC
  Administered 2020-09-28 – 2020-10-10 (×13): 75 ug via ORAL
  Filled 2020-09-28 (×13): qty 1

## 2020-09-28 MED ORDER — PERFLUTREN LIPID MICROSPHERE
1.0000 mL | INTRAVENOUS | Status: AC | PRN
  Administered 2020-09-28: 2 mL via INTRAVENOUS
  Filled 2020-09-28: qty 10

## 2020-09-28 MED ORDER — PHENYLEPH-SHARK LIV OIL-MO-PET 0.25-3-14-71.9 % RE OINT: 1.0000 "application " | TOPICAL_OINTMENT | Freq: Two times a day (BID) | RECTAL | Status: DC | PRN

## 2020-09-28 MED ORDER — CALCIUM CARBONATE ANTACID 500 MG PO CHEW: 1.0000 | CHEWABLE_TABLET | Freq: Every day | ORAL | Status: DC | PRN

## 2020-09-28 MED ORDER — HEPARIN (PORCINE) 25000 UT/250ML-% IV SOLN
1200.0000 [IU]/h | INTRAVENOUS | Status: DC
  Administered 2020-09-28 (×2): 1400 [IU]/h via INTRAVENOUS
  Administered 2020-09-29: 1200 [IU]/h via INTRAVENOUS
  Filled 2020-09-28 (×2): qty 250

## 2020-09-28 MED ORDER — ACETAMINOPHEN 650 MG RE SUPP: 650.0000 mg | Freq: Four times a day (QID) | RECTAL | Status: DC | PRN

## 2020-09-28 MED ORDER — OXYCODONE-ACETAMINOPHEN 10-325 MG PO TABS: 1.0000 | ORAL_TABLET | ORAL | Status: DC

## 2020-09-28 MED ORDER — PREDNISONE 20 MG PO TABS
30.0000 mg | ORAL_TABLET | Freq: Every day | ORAL | Status: DC
  Administered 2020-09-28 – 2020-09-29 (×2): 30 mg via ORAL
  Filled 2020-09-28: qty 2
  Filled 2020-09-28: qty 1

## 2020-09-28 MED ORDER — ONDANSETRON HCL 4 MG PO TABS
4.0000 mg | ORAL_TABLET | Freq: Four times a day (QID) | ORAL | Status: DC | PRN
  Administered 2020-09-28: 4 mg via ORAL
  Filled 2020-09-28: qty 1

## 2020-09-28 MED ORDER — SODIUM CHLORIDE 0.45 % IV SOLN: INTRAVENOUS | Status: DC

## 2020-09-28 MED ORDER — PHENYLEPHRINE-MINERAL OIL-PET 0.25-14-74.9 % RE OINT
1.0000 "application " | TOPICAL_OINTMENT | Freq: Two times a day (BID) | RECTAL | Status: DC | PRN
  Filled 2020-09-28: qty 57

## 2020-09-28 MED ORDER — ATORVASTATIN CALCIUM 20 MG PO TABS
20.0000 mg | ORAL_TABLET | ORAL | Status: DC
  Administered 2020-09-29 – 2020-10-02 (×3): 20 mg via ORAL
  Filled 2020-09-28 (×4): qty 1

## 2020-09-28 MED ORDER — ACETAMINOPHEN 325 MG PO TABS
650.0000 mg | ORAL_TABLET | Freq: Four times a day (QID) | ORAL | Status: DC | PRN
  Administered 2020-09-29: 650 mg via ORAL
  Filled 2020-09-28: qty 2

## 2020-09-28 MED ORDER — ONDANSETRON HCL 4 MG PO TABS: 8.0000 mg | ORAL_TABLET | Freq: Three times a day (TID) | ORAL | Status: DC | PRN

## 2020-09-28 MED ORDER — ALUM & MAG HYDROXIDE-SIMETH 200-200-20 MG/5ML PO SUSP
30.0000 mL | Freq: Four times a day (QID) | ORAL | Status: DC | PRN
  Administered 2020-09-28 – 2020-10-03 (×2): 30 mL via ORAL
  Filled 2020-09-28 (×2): qty 30

## 2020-09-28 NOTE — ED Notes (Signed)
Purple DNR sticker applied to patient ID band

## 2020-09-28 NOTE — Progress Notes (Signed)
Brief Pharmacy Note:  61 y/oF on IV heparin for PE and DVT.    PM heparin level = 0.6 units/mL, now therapeutic after decrease in rate earlier today  CBC: Hgb WNL, Plts low but consistent with previous results  No bleeding or infusion issues noted per nursing. RN notes that dressing on left lower leg and at IV site were changed and both dressings dry and intact (no further bleeding).    Plan:  Continue IV heparin infusion at 1400 units/hr  Heparin level in 8 hours to confirm remains within therapeutic range  Daily CBC, heparin level  Monitor closely for s/sx of bleeding   Lindell Spar, PharmD, BCPS Clinical Pharmacist  09/28/2020 7:23 PM

## 2020-09-28 NOTE — Progress Notes (Signed)
ANTICOAGULATION CONSULT NOTE - Follow Consult  Pharmacy Consult for heparin  Indication: pulmonary embolus  Allergies  Allergen Reactions  . Prozac [Fluoxetine Hcl] Anaphylaxis  . Codeine Itching  . Morphine And Related Itching  . Sulfa Antibiotics Itching and Nausea Only    Patient Measurements: Height: 5\' 6"  (167.6 cm) Weight: 99.8 kg (220 lb) IBW/kg (Calculated) : 59.3 Heparin Dosing Weight: 81.8 kg  Vital Signs: Temp: 98.4 F (36.9 C) (10/08 0500) Temp Source: Oral (10/08 0500) BP: 192/111 (10/08 0754) Pulse Rate: 82 (10/08 0754)  Labs: Recent Labs    09/27/20 1738 09/27/20 2327 09/28/20 0509 09/28/20 0730  HGB 13.3  --  12.2  --   HCT 38.4  --  35.0*  --   PLT 127*  --  119*  --   APTT  --  22*  --   --   LABPROT  --  12.7  --   --   INR  --  1.0  --   --   HEPARINUNFRC  --   --   --  1.34*  CREATININE 0.95  --  0.80  --   CKTOTAL 34*  --   --   --     Estimated Creatinine Clearance: 75.8 mL/min (by C-G formula based on SCr of 0.8 mg/dL).   Medical History: Past Medical History:  Diagnosis Date  . Allergic rhinitis   . Arthritis   . Bursitis    Both Shoulders  . Chronic low back pain   . Degenerative disk disease   . Depression   . Diverticulitis   . Emphysema of lung ([place])   . Fibromyalgia   . GERD (gastroesophageal reflux disease)   . History of fibromyalgia   . Hypertension   . Hypothyroidism   . Irritable bowel syndrome   . Neuralgia, post-herpetic   . Obesity, unspecified   . Obstructive sleep apnea   . OCD (obsessive compulsive disorder)   . Pain in joint, site unspecified   . Pure hypercholesterolemia   . Scoliosis   . Situational stress   . Spastic colon   . Tubular adenoma   . Uterine cancer [facility] [place])      Assessment: 74 year old female with a history of prior rectal cancer status post chemotherapy and radiation, obesity, COPD, hypothyroidism who is presenting today after a fall at home.   CTA is positive for PE , pharmacy to  dose heparin drip.  No prior AC noted  09/28/2020  Heparin level 1.34, supratherapeutic  Hgb wnl, Plts low but consistent with previous results  Per discussion with RN, patients having slightly bleeding at wound to left lower leg where she fell at home and around the IV site in right hand  Goal of Therapy:  Heparin level 0.3-0.7 units/ml Monitor platelets by anticoagulation protocol:   Plan:  Hold heparin x 1 hour Decrease heparin drip to 1400 units/hr Heparin level in 8 hours  Daily CBC, heparin level  Peggyann Juba, PharmD, BCPS Pharmacy: [phone] 09/28/2020, 8:58 AM

## 2020-09-28 NOTE — ED Notes (Signed)
Bleeding from around right hand IV site and left leg wound. Per pharmacy hold for 1 hour, restart at 1400 units/hr. Recheck level in 8 hrs

## 2020-09-28 NOTE — Progress Notes (Signed)
IP PROGRESS NOTE  Subjective:   Joanne Klein is known to me with a history of anal cancer.  She has undergone an extensive diagnostic evaluation over the past several months for evaluation of leg weakness.  She has been seen by neurosurgery and neurology.  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  She was placed on steroids with initial partial improvement.  However the weakness has progressed over the past few months.  She presented yesterday after multiple falls.  She reports the the leg weakness has progressed.  No other complaint. She was noted to be hypoxic.  A CT chest confirmed a pulmonary embolism.  She denies dyspnea.  She has missed recent appointments at the Cancer center.  Prednisone was tapered to 30 mg daily on 09/13/2020. Objective: Vital signs in last 24 hours: Blood pressure (!) 171/90, pulse 96, temperature 98.4 F (36.9 C), temperature source Oral, resp. rate 10, height 5\' 6"  (1.676 m), weight 220 lb (99.8 kg), SpO2 97 %.  Intake/Output from previous day: No intake/output data recorded.  Physical Exam:  HEENT: No thrush Lungs: Clear bilaterally Cardiac: Regular rate and rhythm Abdomen: No hepatosplenomegaly, nontender, no mass Extremities: No leg edema Neurologic: Strength is intact with plantar and dorsiflexion bilaterally.  2/5 strength with flexion at the hip bilaterally, 3/5 strength with extensio  Lab Results: Recent Labs    09/27/20 1738 09/28/20 0509  WBC 8.0 7.2  HGB 13.3 12.2  HCT 38.4 35.0*  PLT 127* 119*    BMET Recent Labs    09/27/20 1738 09/28/20 0509  NA 141 141  K 3.7 3.3*  CL 102 103  CO2 29 29  GLUCOSE 145* 98  BUN 28* 28*  CREATININE 0.95 0.80  CALCIUM 9.2 8.8*    No results found for: CEA1  Studies/Results: CT Angio Chest PE W and/or Wo Contrast  Result Date: 09/27/2020 CLINICAL DATA:  Weakness fell in bathroom EXAM: CT ANGIOGRAPHY CHEST WITH CONTRAST TECHNIQUE: Multidetector  CT imaging of the chest was performed using the standard protocol during bolus administration of intravenous contrast. Multiplanar CT image reconstructions and MIPs were obtained to evaluate the vascular anatomy. CONTRAST:  125mL OMNIPAQUE IOHEXOL 350 MG/ML SOLN COMPARISON:  Chest x-ray 09/27/2020, CT chest 10/12/2019 FINDINGS: Cardiovascular: Satisfactory opacification of the pulmonary arteries to the segmental level. Acute thrombus straddling left upper lobe pulmonary vessel to the descending left lower lobe pulmonary artery. Extension of thrombus into left upper and left lower lobe segmental vessels. No definitive right-sided emboli. Elevation of RV LV ratio which measures 1.3. Mild cardiomegaly. No pericardial effusion. Moderate aortic atherosclerosis. Ectatic ascending aorta without aneurysm. Mild coronary vascular calcification. No pericardial effusion. Mediastinum/Nodes: Midline trachea. Stable partially calcified subcentimeter nodule left lobe of thyroid, no further workup recommended. No suspicious adenopathy. Esophagus within normal limits. Lungs/Pleura: Moderate emphysema. No acute consolidation or effusion. No pneumothorax. Upper Abdomen: Calcified granuloma in the spleen. Status post cholecystectomy. No acute abnormality Musculoskeletal: No chest wall abnormality. No acute or significant osseous findings. Review of the MIP images confirms the above findings. IMPRESSION: 1. Positive for acute left-sided pulmonary emboli. Positive for acute PE with CT evidence of right heart strain (RV/LV Ratio = 1.3) consistent with at least submassive (intermediate risk) PE. The presence of right heart strain has been associated with an increased risk of morbidity and mortality. 2. Emphysema. No acute consolidation or effusion. 3. Emphysema and aortic atherosclerosis. Critical Value/emergent results were called by telephone at the time of interpretation  on 09/27/2020 at 9:43 pm to provider [facility] , who verbally  acknowledged these results. Aortic Atherosclerosis (ICD10-I70.0) and Emphysema (ICD10-J43.9). Electronically Signed   By: Donavan Foil M.D.   On: 09/27/2020 21:43   DG Chest Port 1 View  Result Date: 09/27/2020 CLINICAL DATA:  Weakness EXAM: PORTABLE CHEST 1 VIEW COMPARISON:  11/16/2019 FINDINGS: The heart size and mediastinal contours are within normal limits. Both lungs are clear. The visualized skeletal structures are unremarkable. Aortic atherosclerosis. IMPRESSION: No active disease. Electronically Signed   By: Donavan Foil M.D.   On: 09/27/2020 18:32   ECHOCARDIOGRAM COMPLETE  Result Date: 09/28/2020    ECHOCARDIOGRAM REPORT   Patient Name:   Joanne Klein Date of Exam: 09/28/2020 Medical Rec #:  [id number]       Height:       66.0 in Accession #:    [id number]      Weight:       220.0 lb Date of Birth:  26-Dec-1945        BSA:          2.082 m Patient Age:    74 years        BP:           157/85 mmHg Patient Gender: F               HR:           84 bpm. Exam Location:  Inpatient Procedure: 2D Echo, Cardiac Doppler, Color Doppler and Intracardiac            Opacification Agent Indications:    Pulmonary Embolus 415.19 / I26.99  History:        Patient has no prior history of Echocardiogram examinations.                 COPD; Risk Factors:Sleep Apnea, Dyslipidemia and Former Smoker.                 GERD.  Sonographer:    Vickie Epley RDCS Referring Phys: [id number] [facility] Julious Oka  Sonographer Comments: Suboptimal parasternal window. IMPRESSIONS  1. Left ventricular ejection fraction, by estimation, is 60 to 65%. The left ventricle has normal function. The left ventricle has no regional wall motion abnormalities. Left ventricular diastolic parameters are consistent with Grade I diastolic dysfunction (impaired relaxation).  2. Right ventricular systolic function is normal. The right ventricular size is normal.  3. The mitral valve was not well visualized. No evidence of mitral valve regurgitation.  4. The  aortic valve is grossly normal. Aortic valve regurgitation is not visualized. No aortic stenosis is present. FINDINGS  Left Ventricle: Left ventricular ejection fraction, by estimation, is 60 to 65%. The left ventricle has normal function. The left ventricle has no regional wall motion abnormalities. Definity contrast agent was given IV to delineate the left ventricular  endocardial borders. The left ventricular internal cavity size was normal in size. There is no left ventricular hypertrophy. Left ventricular diastolic parameters are consistent with Grade I diastolic dysfunction (impaired relaxation). Right Ventricle: The right ventricular size is normal. No increase in right ventricular wall thickness. Right ventricular systolic function is normal. Left Atrium: Left atrial size was normal in size. Right Atrium: Right atrial size was normal in size. Pericardium: There is no evidence of pericardial effusion. Mitral Valve: The mitral valve was not well visualized. No evidence of mitral valve regurgitation. Tricuspid Valve: The tricuspid valve is normal in structure. Tricuspid valve regurgitation is not demonstrated. Aortic  Valve: The aortic valve is grossly normal. Aortic valve regurgitation is not visualized. No aortic stenosis is present. Pulmonic Valve: The pulmonic valve was normal in structure. Pulmonic valve regurgitation is not visualized. Aorta: The aortic root was not well visualized. IAS/Shunts: The atrial septum is grossly normal.  LEFT VENTRICLE PLAX 2D LVOT diam:     2.20 cm     Diastology LV SV:         94          LV e' medial:    5.66 cm/s LV SV Index:   45          LV E/e' medial:  10.6 LVOT Area:     3.80 cm    LV e' lateral:   7.18 cm/s                            LV E/e' lateral: 8.4  LV Volumes (MOD) LV vol d, MOD A2C: 80.7 ml LV vol d, MOD A4C: 86.6 ml LV vol s, MOD A2C: 27.5 ml LV vol s, MOD A4C: 28.8 ml LV SV MOD A2C:     53.2 ml LV SV MOD A4C:     86.6 ml LV SV MOD BP:      56.3 ml RIGHT  VENTRICLE RV S prime:     8.38 cm/s TAPSE (M-mode): 1.5 cm LEFT ATRIUM           Index       RIGHT ATRIUM           Index LA Vol (A2C): 19.1 ml 9.17 ml/m  RA Area:     11.20 cm LA Vol (A4C): 27.7 ml 13.30 ml/m RA Volume:   24.10 ml  11.57 ml/m  AORTIC VALVE LVOT Vmax:   140.00 cm/s LVOT Vmean:  90.400 cm/s LVOT VTI:    0.246 m  AORTA Ao Root diam: 3.20 cm MITRAL VALVE MV Area (PHT): 3.23 cm    SHUNTS MV Decel Time: 235 msec    Systemic VTI:  0.25 m MV E velocity: 60.10 cm/s  Systemic Diam: 2.20 cm MV A velocity: 75.70 cm/s MV E/A ratio:  0.79 Mertie Moores MD Electronically signed by Mertie Moores MD Signature Date/Time: 09/28/2020/2:09:58 PM    Final    VAS [place] LOWER EXTREMITY VENOUS (DVT)  Result Date: 09/28/2020  Lower Venous DVTStudy Indications: Pulmonary embolism.  Anticoagulation: Heparin. Limitations: Poor ultrasound/tissue interface and bandages. Comparison Study: 10-13-2019 Prior bilateral lower extremity venous. Performing Technologist: Darlin Coco  Examination Guidelines: A complete evaluation includes B-mode imaging, spectral Doppler, color Doppler, and power Doppler as needed of all accessible portions of each vessel. Bilateral testing is considered an integral part of a complete examination. Limited examinations for reoccurring indications may be performed as noted. The reflux portion of the exam is performed with the patient in reverse Trendelenburg.  +---------+---------------+---------+-----------+----------+---------------+ RIGHT    CompressibilityPhasicitySpontaneityPropertiesThrombus Aging  +---------+---------------+---------+-----------+----------+---------------+ CFV      Full           Yes      Yes                                  +---------+---------------+---------+-----------+----------+---------------+ SFJ      Full                                                         +---------+---------------+---------+-----------+----------+---------------+  FV Prox   Full                                                         +---------+---------------+---------+-----------+----------+---------------+ FV Mid   Full                                                         +---------+---------------+---------+-----------+----------+---------------+ FV DistalFull                                                         +---------+---------------+---------+-----------+----------+---------------+ PFV      Full                                                         +---------+---------------+---------+-----------+----------+---------------+ POP      Full           Yes      Yes                                  +---------+---------------+---------+-----------+----------+---------------+ PTV      Partial                                                      +---------+---------------+---------+-----------+----------+---------------+ PERO                                                  Patent by color +---------+---------------+---------+-----------+----------+---------------+   +---------+---------------+---------+-----------+----------+-------------------+ LEFT     CompressibilityPhasicitySpontaneityPropertiesThrombus Aging      +---------+---------------+---------+-----------+----------+-------------------+ CFV      Full           Yes      Yes                                      +---------+---------------+---------+-----------+----------+-------------------+ SFJ      Full                                                             +---------+---------------+---------+-----------+----------+-------------------+ FV Prox  Full                                                             +---------+---------------+---------+-----------+----------+-------------------+  FV Mid   Full                                                              +---------+---------------+---------+-----------+----------+-------------------+ FV DistalFull                                                             +---------+---------------+---------+-----------+----------+-------------------+ PFV      Full                                                             +---------+---------------+---------+-----------+----------+-------------------+ POP      Full           Yes      Yes                                      +---------+---------------+---------+-----------+----------+-------------------+ PTV      Full                                         Some segments not                                                         visualized due to                                                         bandages            +---------+---------------+---------+-----------+----------+-------------------+ PERO                                                  Patent by color-                                                          some segments not  visualized due to                                                         bandages            +---------+---------------+---------+-----------+----------+-------------------+    Summary: RIGHT: - Findings consistent with age indeterminate deep vein thrombosis involving the right posterior tibial veins. - No cystic structure found in the popliteal fossa.  LEFT: - There is no evidence of deep vein thrombosis in the lower extremity.  - No cystic structure found in the popliteal fossa.  *See table(s) above for measurements and observations.    Preliminary     Medications: I have reviewed the patient's current medications.  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 Biopsy 08/30/2019 confirmed well-differentiated squamous cell carcinoma with positive P 16 and p63 stains ? CTs  09/14/2019-abnormal soft tissue fullness at the lower anus/perianal soft tissues, asymmetric left inguinal lymphadenopathy, borderline enlarged left external iliac node, mild stranding and cutaneous thickening of the left gluteal fold, 2 to 3 mm pulmonary nodules, 1.6 cm right hepatic lesion-likely a hemangioma ? PET scan 62/01/2978-[id number] hypermetabolic anal mass. 3 hypermetabolic left inguinal lymph nodes. Left external iliac node with minimally higher than blood pool activity. 1.0 x 0.8 cm left thyroid nodule with activity mildly above background blood pool activity but below liver activity. No perceptible hypermetabolic activity in the vicinity of the lateral right hepatic lobe lesion seen on the 09/14/2019 CT. ? Began concurrent chemoRT with 5FU/mitomycin on 10/03/2019 ? Cycle25-FU, dose reduced, mitomycin held 11/07/2019 ? Radiation completed 11/24/2019 ? CT abdomen/pelvis 12/27/2019-extensive left-sided colonic diverticulosis without diverticulitis. No bowel obstruction. No abscess in the abdomen or pelvis. Rectum borderline distended with stool without perirectal wall thickening or soft tissue stranding. Left inguinal and external iliac lymph nodes now subcentimeter compared to the previous size. Stable small lesion in the right lobe of the liver. No new liver lesions evident. 2. COPD 3. Fibromyalgia 4. Chronic back pain 5. Hypothyroid 6. Depression 7. 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 Pain secondary to #1 9. Orthostatic hypotension, fatigue, dyspnea on exertion, early mucositis requiring supportive care on day 9, secondary to chemotherapy  10. Thrombocytopenia PLT 58K and neutropenia ANC 1.4 on day 9, secondary to chemotherapy 11. Worsening cytopenias, PLT 8K and ANC 0.0 on day 15, secondary to chemotherapy. Started prophylaxis with cipro on 10/22 day 11 12. Hospital admission 10/17/2019 -fatigue, pancytopenia, hypokalemia 13.Left thyroid nodule with activity mildly above  background blood pool activity butbelowliver activity on PET scan 09/27/2019.Thyroid ultrasound219 2021-1.6 cm nodule left inferior thyroid gland. 03/23/7407 FNA-benign follicular nodule. 14.Tiny lung nodules noted on chest CT 09/14/2019 15. Falls, back/leg pain and leg weakness-CT lumbar spine 05/26/2020 with no acute abnormality within the lumbar spine; multilevel degenerative spondylosis with resultant mild canal with bilateral lateral recess stenosis at L4-5; moderate bilateral L4 and L5 foraminal stenosis related to disc bulge, reactive endplate changes and facet degeneration; soft tissue density/stranding involving the presacral space anterior to the sacrum, incompletely visualized  MRI thoracic and lumbar spine 06/14/2020-no mass or fracture, degenerative changes in the thoracic and lumbar spine  MRI brain 06/21/2020-no acute abnormality  MRI cervical, thoracic and lumbar spine 06/21/2020-no abnormal cervical cord signal, mild cervical degenerative changes without high-grade stenosis, no significant abnormal enhancement in the thoracolumbar spine.  MRI pelvis 06/21/2020-no clearly defined mass or asymmetry in the anal/perianal region or adjacent rectum, mild but new presacral edemawithin the iliacus muscles, piriformis muscles, central gluteus muscles, and bilateral hip adductor musculature in a symmetric fashion. This is also accompanied by low-grade enhancement. This may reflect myositis, and association with prior regional radiation therapy is not excluded.  Trial of prednisone 60 mg daily 06/22/2020, tapered by radiation oncology, dose increased to 40 mg daily 08/06/2020, tapered to 30 mg daily beginning 09/13/2020 16.  Left-sided pulmonary embolism on CT 09/27/2020-Heparin initiated 17.  Admission 09/27/2020 with progressive leg weakness  Joanne Klein has a history of anal cancer.  She is in clinical remission.  Multiple imaging studies have not revealed evidence of recurrent anal cancer.  She  has developed progressive leg weakness over the past 6 months.  The etiology remains clear.  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  She has undergone an extensive diagnostic evaluation including MRIs, neurosurgery, and neurology evaluations.  She has not undergone a lumbar puncture to rule out carcinomatous meningitis, but this is felt to be unlikely.  Recommendations: 1.  Consider repeat neurology evaluation as etiology of the leg weakness remains unclear 2.  Taper prednisone as it has not clearly been beneficial 3.  Heparin followed by a DOAC for treatment of the pulmonary embolism 4.  Please call Oncology over the weekend as needed.  I will check on her 10/01/2020 if she remains in the hospital.   LOS: 1 day   Betsy Coder, MD   09/28/2020, 2:10 PM

## 2020-09-28 NOTE — ED Notes (Signed)
Breakfast tray to room. Set up provided

## 2020-09-28 NOTE — Progress Notes (Signed)
  Echocardiogram 2D Echocardiogram has been performed.  Joanne Klein 09/28/2020, 1:44 PM

## 2020-09-28 NOTE — Progress Notes (Signed)
PROGRESS NOTE    Joanne Klein  EHU:[id number] DOB: 1946/03/25 DOA: 09/27/2020 PCP: Hoyt Koch, MD     Brief Narrative:  Joanne Klein is a 74 y.o. female with medical history significant of rectal cancer status post chemotherapy and radiation therapy who developed significant myopathy following treatment, and has significant lower extremity weakness, neuralgia, known degenerative disc disease of the lumbar spine, GERD, hypertension, fibromyalgia, overall weakness with poor mobility, morbid obesity as well as obstructive sleep apnea, hypertension, hypothyroidism, who presented to the hospital with worsening weakness as well as another fall.  She was evaluated and was being admitted with failure to thrive at home due to weakness with plan for possible PT OT and placement.  While being evaluated, patient was noted to be hypoxic and also tachycardic. Work-up with CT angiogram of the chest showed pulmonary embolism.  Patient has been placed on oxygen and is being treated and admitted for pulmonary embolism in addition to her chronic problems.   New events last 24 hours / Subjective: She states that the past 2 weeks, she has hardly been mobile, basically just getting up to use the restroom.  Has been significantly weak.  Assessment & Plan:   Principal Problem:   Pulmonary embolism (HCC) Active Problems:   Depression   Hypothyroidism   GERD (gastroesophageal reflux disease)   COPD GOLD II    Hyperlipidemia   Morbid obesity ([place])   Anal cancer (HCC)   Chronic respiratory failure with hypoxia (HCC)   Weakness of both legs   Falls frequently   PE and DVT -Provoked in setting of immobility, need at least 3 mo anticoagulation treatment -Venous Doppler ultrasound revealed indeterminant DVT of the right lower extremity -Discussed risks and benefits of anticoagulation with patient and family member at bedside -Continue IV heparin, transition to [place] prior to  discharge -Echocardiogram pending  Acute hypoxemic respiratory failure -Secondary to above -Continue nasal cannula O2 and wean as able  Lower extremity weakness -Thought to be secondary to myositis -EDP discussed with [name] with neurology who did not feel that MRI would provide more information.  It was felt that her condition is likely just worsened from being deconditioned -Continue prednisone -PT OT consult -SNF placement pending  History of rectal cancer -Follows with Dr. Benay Spice  Hypothyroidism -Continue Synthroid  GERD -Continue Protonix  Depression -Continue Zoloft  Hyperlipidemia -Continue Lipitor  Hypokalemia -Replace, trend    DVT prophylaxis: IV heparin   Code Status: DNR Family Communication: At bedside Disposition Plan:  Status is: Inpatient  Remains inpatient appropriate because:Unsafe d/c plan, IV treatments appropriate due to intensity of illness or inability to take PO and Inpatient level of care appropriate due to severity of illness   Dispo: The patient is from: Home              Anticipated d/c is to: SNF              Anticipated d/c date is: 1 day              Patient currently is not medically stable to d/c.  Remains on IV heparin, echocardiogram pending, PT evaluation pending, SNF placement pending    Consultants:   None  Procedures:   None  Antimicrobials:  Anti-infectives (From admission, onward)   None        Objective: Vitals:   09/28/20 0952 09/28/20 1000 09/28/20 1100 09/28/20 1200  BP: (!) 151/76 (!) 152/95 (!) 153/84 (!) 157/85  Pulse: Marland Kitchen)  106 100 85 81  Resp: (!) 22 14  13   Temp:      TempSrc:      SpO2: 96% 96% 95% 94%  Weight:      Height:       No intake or output data in the 24 hours ending 09/28/20 1218 Filed Weights   09/28/20 0848  Weight: 99.8 kg    Examination:  General exam: Appears calm and comfortable  Respiratory system: Clear to auscultation. Respiratory effort normal. No  respiratory distress. No conversational dyspnea.  Cardiovascular system: S1 & S2 heard, RRR. No murmurs. No pedal edema. Gastrointestinal system: Abdomen is nondistended, soft and nontender. Normal bowel sounds heard. Central nervous system: Alert and oriented.  Extremities: Symmetric in appearance  Skin: +Some bruising of LEs noted  Psychiatry: Judgement and insight appear normal. Mood & affect appropriate.   Data Reviewed: I have personally reviewed following labs and imaging studies  CBC: Recent Labs  Lab 09/27/20 1738 09/28/20 0509  WBC 8.0 7.2  NEUTROABS 7.2  --   HGB 13.3 12.2  HCT 38.4 35.0*  MCV 96.0 96.4  PLT 127* 485*   Basic Metabolic Panel: Recent Labs  Lab 09/27/20 1738 09/28/20 0509  NA 141 141  K 3.7 3.3*  CL 102 103  CO2 29 29  GLUCOSE 145* 98  BUN 28* 28*  CREATININE 0.95 0.80  CALCIUM 9.2 8.8*   GFR: Estimated Creatinine Clearance: 75.8 mL/min (by C-G formula based on SCr of 0.8 mg/dL). Liver Function Tests: Recent Labs  Lab 09/28/20 0509  AST 14*  ALT 15  ALKPHOS 60  BILITOT 1.5*  PROT 5.9*  ALBUMIN 3.5   No results for input(s): LIPASE, AMYLASE in the last 168 hours. No results for input(s): AMMONIA in the last 168 hours. Coagulation Profile: Recent Labs  Lab 09/27/20 2327  INR 1.0   Cardiac Enzymes: Recent Labs  Lab 09/27/20 1738  CKTOTAL 34*   BNP (last 3 results) No results for input(s): PROBNP in the last 8760 hours. HbA1C: No results for input(s): HGBA1C in the last 72 hours. CBG: No results for input(s): GLUCAP in the last 168 hours. Lipid Profile: No results for input(s): CHOL, HDL, LDLCALC, TRIG, CHOLHDL, LDLDIRECT in the last 72 hours. Thyroid Function Tests: Recent Labs    09/28/20 0509  TSH 3.125   Anemia Panel: No results for input(s): VITAMINB12, FOLATE, FERRITIN, TIBC, IRON, RETICCTPCT in the last 72 hours. Sepsis Labs: No results for input(s): PROCALCITON, LATICACIDVEN in the last 168 hours.  Recent  Results (from the past 240 hour(s))  Respiratory Panel by RT PCR (Flu A&B, Covid) - Nasopharyngeal Swab     Status: None   Collection Time: 09/27/20  8:33 PM   Specimen: Nasopharyngeal Swab  Result Value Ref Range Status   SARS Coronavirus 2 by RT PCR NEGATIVE NEGATIVE Final    Comment: (NOTE) SARS-CoV-2 target nucleic acids are NOT DETECTED.  The SARS-CoV-2 RNA is generally detectable in upper respiratoy specimens during the acute phase of infection. The lowest concentration of SARS-CoV-2 viral copies this assay can detect is 131 copies/mL. A negative result does not preclude SARS-Cov-2 infection and should not be used as the sole basis for treatment or other patient management decisions. A negative result may occur with  improper specimen collection/handling, submission of specimen other than nasopharyngeal swab, presence of viral mutation(s) within the areas targeted by this assay, and inadequate number of viral copies (<131 copies/mL). A negative result must be combined with clinical observations,  patient history, and epidemiological information. The expected result is Negative.  Fact Sheet for Patients:  PinkCheek.be  Fact Sheet for Healthcare Providers:  GravelBags.it  This test is no t yet approved or cleared by the [place] FDA and  has been authorized for detection and/or diagnosis of SARS-CoV-2 by FDA under an Emergency Use Authorization (EUA). This EUA will remain  in effect (meaning this test can be used) for the duration of the COVID-19 declaration under Section 564(b)(1) of the Act, 21 U.S.C. section 360bbb-3(b)(1), unless the authorization is terminated or revoked sooner.     Influenza A by PCR NEGATIVE NEGATIVE Final   Influenza B by PCR NEGATIVE NEGATIVE Final    Comment: (NOTE) The Xpert Xpress SARS-CoV-2/FLU/RSV assay is intended as an aid in  the diagnosis of influenza from Nasopharyngeal swab  specimens and  should not be used as a sole basis for treatment. Nasal washings and  aspirates are unacceptable for Xpert Xpress SARS-CoV-2/FLU/RSV  testing.  Fact Sheet for Patients: PinkCheek.be  Fact Sheet for Healthcare Providers: GravelBags.it  This test is not yet approved or cleared by the [place] FDA and  has been authorized for detection and/or diagnosis of SARS-CoV-2 by  FDA under an Emergency Use Authorization (EUA). This EUA will remain  in effect (meaning this test can be used) for the duration of the  Covid-19 declaration under Section 564(b)(1) of the Act, 21  U.S.C. section 360bbb-3(b)(1), unless the authorization is  terminated or revoked. Performed at [facility], [place] [address], [place], [place] [address]       Radiology Studies: CT Angio Chest PE W and/or Wo Contrast  Result Date: 09/27/2020 CLINICAL DATA:  Weakness fell in bathroom EXAM: CT ANGIOGRAPHY CHEST WITH CONTRAST TECHNIQUE: Multidetector CT imaging of the chest was performed using the standard protocol during bolus administration of intravenous contrast. Multiplanar CT image reconstructions and MIPs were obtained to evaluate the vascular anatomy. CONTRAST:  185mL OMNIPAQUE IOHEXOL 350 MG/ML SOLN COMPARISON:  Chest x-ray 09/27/2020, CT chest 10/12/2019 FINDINGS: Cardiovascular: Satisfactory opacification of the pulmonary arteries to the segmental level. Acute thrombus straddling left upper lobe pulmonary vessel to the descending left lower lobe pulmonary artery. Extension of thrombus into left upper and left lower lobe segmental vessels. No definitive right-sided emboli. Elevation of RV LV ratio which measures 1.3. Mild cardiomegaly. No pericardial effusion. Moderate aortic atherosclerosis. Ectatic ascending aorta without aneurysm. Mild coronary vascular calcification. No pericardial effusion. Mediastinum/Nodes: Midline trachea.  Stable partially calcified subcentimeter nodule left lobe of thyroid, no further workup recommended. No suspicious adenopathy. Esophagus within normal limits. Lungs/Pleura: Moderate emphysema. No acute consolidation or effusion. No pneumothorax. Upper Abdomen: Calcified granuloma in the spleen. Status post cholecystectomy. No acute abnormality Musculoskeletal: No chest wall abnormality. No acute or significant osseous findings. Review of the MIP images confirms the above findings. IMPRESSION: 1. Positive for acute left-sided pulmonary emboli. Positive for acute PE with CT evidence of right heart strain (RV/LV Ratio = 1.3) consistent with at least submassive (intermediate risk) PE. The presence of right heart strain has been associated with an increased risk of morbidity and mortality. 2. Emphysema. No acute consolidation or effusion. 3. Emphysema and aortic atherosclerosis. Critical Value/emergent results were called by telephone at the time of interpretation on 09/27/2020 at 9:43 pm to provider [facility] , who verbally acknowledged these results. Aortic Atherosclerosis (ICD10-I70.0) and Emphysema (ICD10-J43.9). Electronically Signed   By: Donavan Foil M.D.   On: 09/27/2020 21:43   DG Chest [facility]  1 View  Result Date: 09/27/2020 CLINICAL DATA:  Weakness EXAM: PORTABLE CHEST 1 VIEW COMPARISON:  11/16/2019 FINDINGS: The heart size and mediastinal contours are within normal limits. Both lungs are clear. The visualized skeletal structures are unremarkable. Aortic atherosclerosis. IMPRESSION: No active disease. Electronically Signed   By: Donavan Foil M.D.   On: 09/27/2020 18:32   VAS [place] LOWER EXTREMITY VENOUS (DVT)  Result Date: 09/28/2020  Lower Venous DVTStudy Indications: Pulmonary embolism.  Anticoagulation: Heparin. Limitations: Poor ultrasound/tissue interface and bandages. Comparison Study: 10-13-2019 Prior bilateral lower extremity venous. Performing Technologist: Darlin Coco  Examination  Guidelines: A complete evaluation includes B-mode imaging, spectral Doppler, color Doppler, and power Doppler as needed of all accessible portions of each vessel. Bilateral testing is considered an integral part of a complete examination. Limited examinations for reoccurring indications may be performed as noted. The reflux portion of the exam is performed with the patient in reverse Trendelenburg.  +---------+---------------+---------+-----------+----------+---------------+ RIGHT    CompressibilityPhasicitySpontaneityPropertiesThrombus Aging  +---------+---------------+---------+-----------+----------+---------------+ CFV      Full           Yes      Yes                                  +---------+---------------+---------+-----------+----------+---------------+ SFJ      Full                                                         +---------+---------------+---------+-----------+----------+---------------+ FV Prox  Full                                                         +---------+---------------+---------+-----------+----------+---------------+ FV Mid   Full                                                         +---------+---------------+---------+-----------+----------+---------------+ FV DistalFull                                                         +---------+---------------+---------+-----------+----------+---------------+ PFV      Full                                                         +---------+---------------+---------+-----------+----------+---------------+ POP      Full           Yes      Yes                                  +---------+---------------+---------+-----------+----------+---------------+ PTV  Partial                                                      +---------+---------------+---------+-----------+----------+---------------+ PERO                                                  Patent by color  +---------+---------------+---------+-----------+----------+---------------+   +---------+---------------+---------+-----------+----------+-------------------+ LEFT     CompressibilityPhasicitySpontaneityPropertiesThrombus Aging      +---------+---------------+---------+-----------+----------+-------------------+ CFV      Full           Yes      Yes                                      +---------+---------------+---------+-----------+----------+-------------------+ SFJ      Full                                                             +---------+---------------+---------+-----------+----------+-------------------+ FV Prox  Full                                                             +---------+---------------+---------+-----------+----------+-------------------+ FV Mid   Full                                                             +---------+---------------+---------+-----------+----------+-------------------+ FV DistalFull                                                             +---------+---------------+---------+-----------+----------+-------------------+ PFV      Full                                                             +---------+---------------+---------+-----------+----------+-------------------+ POP      Full           Yes      Yes                                      +---------+---------------+---------+-----------+----------+-------------------+ PTV      Full  Some segments not                                                         visualized due to                                                         bandages            +---------+---------------+---------+-----------+----------+-------------------+ PERO                                                  Patent by color-                                                          some segments not                                                          visualized due to                                                         bandages            +---------+---------------+---------+-----------+----------+-------------------+    Summary: RIGHT: - Findings consistent with age indeterminate deep vein thrombosis involving the right posterior tibial veins. - No cystic structure found in the popliteal fossa.  LEFT: - There is no evidence of deep vein thrombosis in the lower extremity.  - No cystic structure found in the popliteal fossa.  *See table(s) above for measurements and observations.    Preliminary       Scheduled Meds: . [START ON 09/29/2020] atorvastatin  20 mg Oral Once per day on Sun Tue Thu Sat  . cholecalciferol  1,000 Units Oral Daily  . levothyroxine  75 mcg Oral QAC breakfast  . oxyCODONE-acetaminophen  1 tablet Oral Q4H   And  . oxyCODONE  5 mg Oral Q4H  . pantoprazole  40 mg Oral Daily  . polyethylene glycol  17 g Oral QHS  . predniSONE  30 mg Oral Q breakfast  . sertraline  200 mg Oral QPM   Continuous Infusions: . heparin 1,400 Units/hr (09/28/20 1000)     LOS: 1 day      Time spent: 45 minutes   Dessa Phi, DO [facility] 09/28/2020, 12:18 PM   Available via Epic secure chat 7am-7pm After these hours, please refer to coverage provider listed on [email or web address]

## 2020-09-28 NOTE — Progress Notes (Signed)
PT Cancellation Note  Patient Details Name: Joanne Klein MRN: [id number] DOB: 1946-07-03   Cancelled Treatment:    Reason Eval/Treat Not Completed: Medical issues which prohibited therapy;Patient not medically ready (On admission CT revealed acute PE. Pharmacy consulted and pt began heparin at 2330 on 10/7. Will hold until pt has been on heparin for 24 hrs and is in therapeutic range. Acute PT will follow at later date/time when pt medically ready.)   Verner Mould, [place]  Office [phone] Pager [phone]  09/28/2020 8:25 AM

## 2020-09-28 NOTE — ED Notes (Signed)
Provided clean blankets and wound care provided.

## 2020-09-28 NOTE — Progress Notes (Signed)
Lower extremity venous bilateral study completed.  Preliminary results relayed to [place], DO.    See CV Proc for preliminary results report.   Darlin Coco, RDMS

## 2020-09-29 DIAGNOSIS — I2699 Other pulmonary embolism without acute cor pulmonale: Secondary | ICD-10-CM | POA: Diagnosis not present

## 2020-09-29 LAB — CBC
HCT: 32.2 % — ABNORMAL LOW (ref 36.0–46.0)
Hemoglobin: 11.2 g/dL — ABNORMAL LOW (ref 12.0–15.0)
MCH: 33.8 pg (ref 26.0–34.0)
MCHC: 34.8 g/dL (ref 30.0–36.0)
MCV: 97.3 fL (ref 80.0–100.0)
Platelets: 123 10*3/uL — ABNORMAL LOW (ref 150–400)
RBC: 3.31 MIL/uL — ABNORMAL LOW (ref 3.87–5.11)
RDW: 13.4 % (ref 11.5–15.5)
WBC: 8.1 10*3/uL (ref 4.0–10.5)
nRBC: 0 % (ref 0.0–0.2)

## 2020-09-29 LAB — BASIC METABOLIC PANEL
Anion gap: 8 (ref 5–15)
BUN: 33 mg/dL — ABNORMAL HIGH (ref 8–23)
CO2: 29 mmol/L (ref 22–32)
Calcium: 8.7 mg/dL — ABNORMAL LOW (ref 8.9–10.3)
Chloride: 103 mmol/L (ref 98–111)
Creatinine, Ser: 0.8 mg/dL (ref 0.44–1.00)
GFR, Estimated: >60 mL/min (ref >60)
Glucose, Bld: 110 mg/dL — ABNORMAL HIGH (ref 70–99)
Potassium: 3.8 mmol/L (ref 3.5–5.1)
Sodium: 140 mmol/L (ref 135–145)

## 2020-09-29 LAB — MAGNESIUM: Magnesium: 2.2 mg/dL (ref 1.7–2.4)

## 2020-09-29 LAB — HEPARIN LEVEL (UNFRACTIONATED): Heparin Unfractionated: 0.86 IU/mL — ABNORMAL HIGH (ref 0.30–0.70)

## 2020-09-29 MED ORDER — RIVAROXABAN 20 MG PO TABS: 20.0000 mg | ORAL_TABLET | Freq: Every day | ORAL | Status: DC

## 2020-09-29 MED ORDER — OXYCODONE HCL ER 10 MG PO T12A
10.0000 mg | EXTENDED_RELEASE_TABLET | Freq: Two times a day (BID) | ORAL | Status: DC
  Administered 2020-09-29 – 2020-10-10 (×23): 10 mg via ORAL
  Filled 2020-09-29 (×24): qty 1

## 2020-09-29 MED ORDER — KETOROLAC TROMETHAMINE 15 MG/ML IJ SOLN
15.0000 mg | Freq: Three times a day (TID) | INTRAMUSCULAR | Status: DC | PRN
  Administered 2020-09-29 – 2020-09-30 (×2): 15 mg via INTRAVENOUS
  Filled 2020-09-29 (×2): qty 1

## 2020-09-29 MED ORDER — FENTANYL CITRATE (PF) 100 MCG/2ML IJ SOLN
25.0000 ug | INTRAMUSCULAR | Status: DC | PRN
  Filled 2020-09-29: qty 2

## 2020-09-29 MED ORDER — RIVAROXABAN 15 MG PO TABS
15.0000 mg | ORAL_TABLET | Freq: Two times a day (BID) | ORAL | Status: DC
  Administered 2020-09-29 – 2020-10-06 (×15): 15 mg via ORAL
  Filled 2020-09-29 (×15): qty 1

## 2020-09-29 NOTE — Progress Notes (Signed)
ANTICOAGULATION CONSULT NOTE - Follow Consult  Pharmacy Consult for heparin  Indication: pulmonary embolus  Allergies  Allergen Reactions  . Prozac [Fluoxetine Hcl] Anaphylaxis  . Codeine Itching  . Morphine And Related Itching  . Sulfa Antibiotics Itching and Nausea Only    Patient Measurements: Height: 5\' 6"  (167.6 cm) Weight: 99.8 kg (220 lb) IBW/kg (Calculated) : 59.3 Heparin Dosing Weight: 81.8 kg  Vital Signs: Temp: 98.9 F (37.2 C) (10/09 0007) Temp Source: Oral (10/09 0007) BP: 143/84 (10/09 0007) Pulse Rate: 97 (10/09 0007)  Labs: Recent Labs    09/27/20 1738 09/27/20 1738 09/27/20 2327 09/28/20 0509 09/28/20 0730 09/28/20 1749 09/29/20 0224  HGB 13.3   < >  --  12.2  --   --  11.2*  HCT 38.4  --   --  35.0*  --   --  32.2*  PLT 127*  --   --  119*  --   --  123*  APTT  --   --  22*  --   --   --   --   LABPROT  --   --  12.7  --   --   --   --   INR  --   --  1.0  --   --   --   --   HEPARINUNFRC  --   --   --   --  1.34* 0.60 0.86*  CREATININE 0.95  --   --  0.80  --   --  0.80  CKTOTAL 34*  --   --   --   --   --   --    < > = values in this interval not displayed.    Estimated Creatinine Clearance: 75.8 mL/min (by C-G formula based on SCr of 0.8 mg/dL).   Medical History: Past Medical History:  Diagnosis Date  . Allergic rhinitis   . Arthritis   . Bursitis    Both Shoulders  . Chronic low back pain   . Degenerative disk disease   . Depression   . Diverticulitis   . Emphysema of lung ([place])   . Fibromyalgia   . GERD (gastroesophageal reflux disease)   . History of fibromyalgia   . Hypertension   . Hypothyroidism   . Irritable bowel syndrome   . Neuralgia, post-herpetic   . Obesity, unspecified   . Obstructive sleep apnea   . OCD (obsessive compulsive disorder)   . Pain in joint, site unspecified   . Pure hypercholesterolemia   . Scoliosis   . Situational stress   . Spastic colon   . Tubular adenoma   . Uterine cancer [facility])       Assessment: 74 year old female with a history of prior rectal cancer status post chemotherapy and radiation, obesity, COPD, hypothyroidism who is presenting today after a fall at home.   CTA is positive for PE , pharmacy to dose heparin drip.  No prior AC noted  09/29/2020  Heparin level 0.86, supratherapeutic  Hgb down to 11.2, Plts low but consistent with previous results  Per discussion with RN, patients having bleeding around the IV site in right hand  Goal of Therapy:  Heparin level 0.3-0.7 units/ml Monitor platelets by anticoagulation protocol:   Plan:  Decrease heparin drip to 1200 units/hr Heparin level in 8 hours  Daily CBC, heparin level  Dolly Rias RPh 09/29/2020, 3:25 AM

## 2020-09-29 NOTE — Progress Notes (Signed)
CSW received reference number from [facility], [id number]. Clinicals faxed, placed on CSW Ricquita's desk.

## 2020-09-29 NOTE — NC FL2 (Signed)
[place] LEVEL OF CARE SCREENING TOOL     IDENTIFICATION  Patient Name: Joanne Klein Birthdate: 1946-04-04 Sex: female Admission Date (Current Location): 09/27/2020  [facility] and [place] Number:  Herbalist and Address:  [facility],  [place], [place]      Provider Number: [id number]  Attending Physician Name and Address:  Dessa Phi, DO  Relative Name and Phone Number:       Current Level of Care: Hospital Recommended Level of Care: [place] Prior Approval Number:    Date Approved/Denied: 09/27/20 PASRR Number: [id number]  Discharge Plan: SNF    Current Diagnoses: Patient Active Problem List   Diagnosis Date Noted  . Pulmonary embolism ([place]) 09/27/2020  . Pain   . Lumbar radiculopathy   . Weakness of both legs 06/19/2020  . Falls frequently 06/19/2020  . Weakness 06/19/2020  . Chronic respiratory failure with hypoxia ([place]) 12/15/2019  . Orthostatic hypotension 12/15/2019  . Numbness and tingling 12/15/2019  . Chemotherapy-induced neutropenia ([place])   . Thrombocytopenia ([place]) 10/17/2019  . Anal cancer ([place]) 09/15/2019  . Routine general medical examination at a health care facility 11/19/2016  . 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 Morbid obesity ([place]) 06/26/2014  . COPD GOLD II    . Hyperlipidemia   . Arthritis 01/16/2014  . Depression 01/13/2014  . Hypothyroidism 01/13/2014  . GERD (gastroesophageal reflux disease) 01/13/2014  . OCD (obsessive compulsive disorder) 01/13/2014  . Fibromyalgia 01/13/2014    Orientation RESPIRATION BLADDER Height & Weight     Self, Time, Situation, Place  O2 (2 liters nasal cannula) Incontinent Weight: 220 lb (99.8 kg) Height:  5\' 6"  (167.6 cm)  BEHAVIORAL SYMPTOMS/MOOD NEUROLOGICAL BOWEL NUTRITION STATUS      Incontinent Diet (Heart Healthy)  AMBULATORY STATUS COMMUNICATION OF NEEDS Skin   Limited Assist Verbally Normal                        Personal Care Assistance Level of Assistance  Bathing, Dressing Bathing Assistance: Limited assistance   Dressing Assistance: Limited assistance     Functional Limitations Info             SPECIAL CARE FACTORS FREQUENCY  PT (By licensed PT), OT (By licensed OT)     PT Frequency: 5 OT Frequency: 5            Contractures Contractures Info: Not present    Additional Factors Info  Allergies, Code Status Code Status Info: Full Allergies Info: Prozac (Fluoxetine Hc), Codeine, Morphine And Related, Sulfa Antibiotics           Current Medications (09/29/2020):  This is the current hospital active medication list Current Facility-Administered Medications  Medication Dose Route Frequency Provider Last Rate Last Admin  . acetaminophen (TYLENOL) tablet 650 mg  650 mg Oral Q6H PRN Elwyn Reach, MD   650 mg at 09/29/20 1203   Or  . acetaminophen (TYLENOL) suppository 650 mg  650 mg Rectal Q6H PRN Elwyn Reach, MD      . alum & mag hydroxide-simeth (MAALOX/MYLANTA) 200-200-20 MG/5ML suspension 30 mL  30 mL Oral Q6H PRN Dessa Phi, DO   30 mL at 09/28/20 1708  . atorvastatin (LIPITOR) tablet 20 mg  20 mg Oral Once per day on Sun Tue Thu Sat Elwyn Reach, MD   20 mg at 09/29/20 5809  . calcium carbonate (TUMS - dosed in mg elemental calcium) chewable tablet 200 mg  of elemental calcium  1 tablet Oral Daily PRN Elwyn Reach, MD      . cholecalciferol (VITAMIN D) tablet 1,000 Units  1,000 Units Oral Daily Elwyn Reach, MD   1,000 Units at 09/29/20 0808  . fentaNYL (SUBLIMAZE) injection 25 mcg  25 mcg Intravenous Q2H PRN Dessa Phi, DO      . ketorolac (TORADOL) 15 MG/ML injection 15 mg  15 mg Intravenous Q8H PRN Dessa Phi, DO   15 mg at 09/29/20 1204  . levothyroxine (SYNTHROID) tablet 75 mcg  75 mcg Oral QAC breakfast Elwyn Reach, MD   75 mcg at 09/29/20 0544  . ondansetron (ZOFRAN) tablet 4 mg  4 mg Oral Q6H PRN Elwyn Reach, MD   4  mg at 09/28/20 1828   Or  . ondansetron (ZOFRAN) injection 4 mg  4 mg Intravenous Q6H PRN Elwyn Reach, MD   4 mg at 09/29/20 0311  . ondansetron (ZOFRAN) tablet 8 mg  8 mg Oral Q8H PRN Gala Romney L, MD      . oxyCODONE (Oxy IR/ROXICODONE) immediate release tablet 10 mg  10 mg Oral Q4H PRN Elwyn Reach, MD   10 mg at 09/29/20 1203  . oxyCODONE (OXYCONTIN) 12 hr tablet 10 mg  10 mg Oral Q12H Dessa Phi, DO   10 mg at 09/29/20 0933  . pantoprazole (PROTONIX) EC tablet 40 mg  40 mg Oral Daily Elwyn Reach, MD   40 mg at 09/29/20 0808  . phenylephrine-shark liver oil-mineral oil-petrolatum (PREPARATION H) rectal ointment 1 application  1 application Rectal BID PRN Gala Romney L, MD      . polyethylene glycol (MIRALAX / GLYCOLAX) packet 17 g  17 g Oral QHS Garba, Mohammad L, MD      . predniSONE (DELTASONE) tablet 30 mg  30 mg Oral Q breakfast Gala Romney L, MD   30 mg at 09/29/20 3382  . prochlorperazine (COMPAZINE) tablet 10 mg  10 mg Oral Q6H PRN Elwyn Reach, MD      . Rivaroxaban (XARELTO) tablet 15 mg  15 mg Oral BID WC Adrian Saran, RPH   15 mg at 09/29/20 0934   Followed by  . [START ON 10/20/2020] rivaroxaban (XARELTO) tablet 20 mg  20 mg Oral Q supper Adrian Saran, [facility]      . sertraline (ZOLOFT) tablet 200 mg  200 mg Oral QPM Elwyn Reach, MD   200 mg at 09/28/20 1654     Discharge Medications: Please see discharge summary for a list of discharge medications.  Relevant Imaging Results:  Relevant Lab Results:   Additional Information [id number]  Loreta Ave, LCSWA

## 2020-09-29 NOTE — TOC Initial Note (Signed)
Transition of Care [facility]) - Initial/Assessment Note    Patient Details  Name: Joanne Klein MRN: [id number] Date of Birth: August 09, 1946  Transition of [facility]) CM/SW Contact:    Loreta Ave, [place] Phone Number: 09/29/2020, 1:41 PM  Clinical Narrative:                 CSW received consult for possible SNF placement at time of discharge. CSW spoke with patient regarding PT recommendation of SNF placement at time of discharge. Patient reported that she has been progressively weaker and she is  currently unable to care for herself at her home given her current physical needs and fall risk. Patient expressed understanding of PT recommendation and is agreeable to SNF placement at time of discharge. Patient reports preference for [facility]. Patient expresses that she doesn't want to go to [facility] by any means. CSW discussed insurance authorization process and provided Medicare SNF ratings list. Patient has received the COVID vaccines. Patient expressed being hopeful for rehab and to feel better soon. Patient gave CSW permission to speak with her niece Cher Nakai by phone [phone], updated her on the potential for patient to discharge to SNF. Andi is on board with the plan for SNF, her concern is patient's worsening leg weakness and what will after her time at the SNF. Andi states she would like to make sure that patient is fully worked up before she leaves. CSW notified MD. No further questions reported at this time. CSW to continue to follow and assist with discharge planning needs.   Expected Discharge Plan: Skilled Nursing Facility Barriers to Discharge: Continued Medical Work up   Patient Goals and CMS Choice Patient states their goals for this hospitalization and ongoing recovery are:: live alone and would prefer to try rehab CMS Medicare.gov Compare Post Acute Care list provided to:: Patient Choice offered to / list presented to : Patient  Expected Discharge Plan and  Services Expected Discharge Plan: [place] In-house Referral: Clinical Social Work Discharge Planning Services: CM Consult Post Acute Care Choice: [place] arrangements for the past 2 months: [place]                                      Prior Living Arrangements/Services Living arrangements for the past 2 months: Single Family Home Lives with:: Self Patient language and need for interpreter reviewed:: Yes Do you feel safe going back to the place where you live?: No   Need more support as I am too weak  Need for Family Participation in Patient Care: Yes (Comment) Care giver support system in place?: Yes (comment) Current home services: DME (rolling walker) Criminal Activity/Legal Involvement Pertinent to Current Situation/Hospitalization: No - Comment as needed  Activities of Daily Living Home Assistive Devices/Equipment: Cane (specify quad or straight), Eyeglasses, Hand-held shower hose, Long-handled shoehorn, Walker (specify type), Shower chair without back, Other (Comment) (walk-in shower, 4 wheeled walker, single point cane, life alert) ADL Screening (condition at time of admission) Patient's cognitive ability adequate to safely complete daily activities?: Yes Is the patient deaf or have difficulty hearing?: No Does the patient have difficulty seeing, even when wearing glasses/contacts?: No Does the patient have difficulty concentrating, remembering, or making decisions?: No Patient able to express need for assistance with ADLs?: Yes Does the patient have difficulty dressing or bathing?: No Independently performs ADLs?: No Communication: Independent Dressing (OT): Independent Grooming:  Independent Feeding: Independent Bathing: Independent Toileting: Independent with device (comment) Is this a change from baseline?: Pre-admission baseline In/Out Bed: Independent Is this a change from baseline?: Pre-admission  baseline Walks in Home: Independent with device (comment) (uses a cane most of the time) Does the patient have difficulty walking or climbing stairs?: Yes (secondary to increasing weakness) Weakness of Legs: Both Weakness of Arms/Hands: None  Permission Sought/Granted Permission sought to share information with : Case Manager, Customer service manager, PCP, Family Supports Permission granted to share information with : Yes, Verbal Permission Granted  Share Information with NAME: Cher Nakai  Permission granted to share info w AGENCY: SNFs  Permission granted to share info w Relationship: Niece  Permission granted to share info w Contact Information: [phone]  Emotional Assessment Appearance:: Appears stated age Attitude/Demeanor/Rapport: Gracious Affect (typically observed): Calm Orientation: : Oriented to Self, Oriented to Place, Oriented to  Time, Oriented to Situation Alcohol / Substance Use: Not Applicable Psych Involvement: No (comment)  Admission diagnosis:  Pulmonary embolism (HCC) [I26.99] PE (pulmonary thromboembolism) (HCC) [I26.99] Severe muscle deconditioning [R29.898] Weakness of both lower extremities [R29.898] Patient Active Problem List   Diagnosis Date Noted  . Pulmonary embolism ([place]) 09/27/2020  . Pain   . Lumbar radiculopathy   . Weakness of both legs 06/19/2020  . Falls frequently 06/19/2020  . Weakness 06/19/2020  . Chronic respiratory failure with hypoxia ([place]) 12/15/2019  . Orthostatic hypotension 12/15/2019  . Numbness and tingling 12/15/2019  . Chemotherapy-induced neutropenia ([place])   . Thrombocytopenia ([place]) 10/17/2019  . Anal cancer ([place]) 09/15/2019  . Routine general medical examination at a health care facility 11/19/2016  . 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 Morbid obesity ([place]) 06/26/2014  . COPD GOLD II    . Hyperlipidemia   . Arthritis 01/16/2014  . Depression 01/13/2014  . Hypothyroidism 01/13/2014  . GERD (gastroesophageal reflux  disease) 01/13/2014  . OCD (obsessive compulsive disorder) 01/13/2014  . Fibromyalgia 01/13/2014   PCP:  Hoyt Koch, MD Pharmacy:   [facility] [place], [place] [address] Phone: [phone] Fax: [phone]     Social Determinants of Health (SDOH) Interventions    Readmission Risk Interventions Readmission Risk Prevention Plan 06/22/2020  Transportation Screening Complete  PCP or Specialist Appt within 5-7 Days Not Complete  Not Complete comments disposition pending  Home Care Screening Complete  Medication Review (RN CM) Referral to Pharmacy  Some recent data might be hidden

## 2020-09-29 NOTE — Evaluation (Signed)
Physical Therapy Evaluation Patient Details Name: Joanne Klein MRN: [id number] DOB: 10/03/1946 Today's Date: 09/29/2020   History of Present Illness  74 y.o. female with medical history significant of rectal cancer status post chemotherapy and radiation therapy who developed significant myopathy following treatment, and has significant lower extremity weakness, neuralgia, known degenerative disc disease of the lumbar spine, GERD, hypertension, fibromyalgia, overall weakness with poor mobility, morbid obesity as well as obstructive sleep apnea, hypertension, hypothyroidism, who presented to the hospital with worsening weakness as well as another fall.  Pt admitted with failure to thrive at home due to weakness and found to have PE  Clinical Impression  Pt admitted with above diagnosis.  Pt currently with functional limitations due to the deficits listed below (see PT Problem List). Pt will benefit from skilled PT to increase their independence and safety with mobility to allow discharge to the venue listed below.  Pt reports inability to stand today and also with increased weakness in LEs (left > right).  Pt states she has been mostly been in lift chair at home and has had multiple falls due to Right knee buckling.  Pt would benefit from SNF upon d/c.    Follow Up Recommendations SNF    Equipment Recommendations  None recommended by PT    Recommendations for Other Services       Precautions / Restrictions Precautions Precautions: Fall Precaution Comments: very tender LEs      Mobility  Bed Mobility Overal bed mobility: Needs Assistance Bed Mobility: Rolling;Supine to Sit;Sit to Supine Rolling: Max assist   Supine to sit: Max assist;+2 for physical assistance Sit to supine: Max assist;+2 for physical assistance   General bed mobility comments: verbal cues for self assist however pt requiring assist for upper and lower body, pt with pain in LEs with movement however also tender to  touch  Transfers                    Ambulation/Gait             General Gait Details: pt unable  Stairs            Wheelchair Mobility    Modified Rankin (Stroke Patients Only)       Balance Overall balance assessment: Needs assistance;History of Falls Sitting-balance support: Bilateral upper extremity supported;Feet supported Sitting balance-Leahy Scale: Poor Sitting balance - Comments: reliant on UE support                                     Pertinent Vitals/Pain Pain Assessment: 0-10 Pain Score: 8  Pain Location: bil LEs Pain Descriptors / Indicators: Tender;Sore Pain Intervention(s): Monitored during session;Premedicated before session;Repositioned    Home Living Family/patient expects to be discharged to:: Private residence Living Arrangements: Alone   Type of Home: House       Home Layout: One level Home Equipment: Clinical cytogeneticist - 4 wheels;Cane - single point Additional Comments: lift chair    Prior Function Level of Independence: Independent with assistive device(s)         Comments: however reports mobility has declined over several months and now she is unable to stand or walk, reports hx of falls due to R knee buckling     Hand Dominance        Extremity/Trunk Assessment        Lower Extremity Assessment Lower Extremity Assessment: RLE deficits/detail;LLE deficits/detail RLE  Deficits / Details: grossly 2+/5 throughout, reports mild numbness RLE Coordination: decreased fine motor LLE Deficits / Details: grossly at best 2-/5, pt unable to move against gravity and requesting assist for even positioning L LE, reports numbness worse in L LE; left lower leg with wound which had dressing however still with bleeding (RN aware) LLE Coordination: decreased fine motor       Communication   Communication: No difficulties  Cognition Arousal/Alertness: Awake/alert Behavior During Therapy: WFL for tasks  assessed/performed Overall Cognitive Status: Within Functional Limits for tasks assessed                                        General Comments      Exercises     Assessment/Plan    PT Assessment Patient needs continued PT services  PT Problem List Decreased strength;Decreased mobility;Decreased activity tolerance;Decreased balance;Decreased knowledge of use of DME;Impaired sensation;Pain;Obesity       PT Treatment Interventions DME instruction;Therapeutic exercise;Balance training;Wheelchair mobility training;Functional mobility training;Therapeutic activities;Patient/family education;Neuromuscular re-education    PT Goals (Current goals can be found in the Care Plan section)  Acute Rehab PT Goals PT Goal Formulation: With patient Time For Goal Achievement: 10/13/20 Potential to Achieve Goals: Fair    Frequency Min 2X/week   Barriers to discharge        Co-evaluation               AM-PAC PT "6 Clicks" Mobility  Outcome Measure Help needed turning from your back to your side while in a flat bed without using bedrails?: A Lot Help needed moving from lying on your back to sitting on the side of a flat bed without using bedrails?: A Lot Help needed moving to and from a bed to a chair (including a wheelchair)?: Total Help needed standing up from a chair using your arms (e.g., wheelchair or bedside chair)?: Total Help needed to walk in hospital room?: Total Help needed climbing 3-5 steps with a railing? : Total 6 Click Score: 8    End of Session Equipment Utilized During Treatment: Gait belt Activity Tolerance: Patient tolerated treatment well Patient left: with call bell/phone within reach;in bed;with bed alarm set Nurse Communication: Mobility status;Need for lift equipment PT Visit Diagnosis: Other abnormalities of gait and mobility (R26.89)    Time: 7544-9201 PT Time Calculation (min) (ACUTE ONLY): 20 min   Charges:   PT Evaluation $PT  Eval Moderate Complexity: 1 Mod        Kati PT, DPT Acute Rehabilitation Services Pager: [phone] Office: [phone]  York Ram E 09/29/2020, 12:36 PM

## 2020-09-29 NOTE — Plan of Care (Signed)

## 2020-09-29 NOTE — Progress Notes (Signed)
PROGRESS NOTE    Joanne Klein  XFG:[id number] DOB: 1946/01/03 DOA: 09/27/2020 PCP: Hoyt Koch, MD     Brief Narrative:  Joanne Klein is a 74 y.o. female with medical history significant of rectal cancer status post chemotherapy and radiation therapy who developed significant myopathy following treatment, and has significant lower extremity weakness, neuralgia, known degenerative disc disease of the lumbar spine, GERD, hypertension, fibromyalgia, overall weakness with poor mobility, morbid obesity as well as obstructive sleep apnea, hypertension, hypothyroidism, who presented to the hospital with worsening weakness as well as another fall.  She was evaluated and was being admitted with failure to thrive at home due to weakness with plan for possible PT OT and placement.  While being evaluated, patient was noted to be hypoxic and also tachycardic. Work-up with CT angiogram of the chest showed pulmonary embolism.  Patient has been placed on oxygen and is being treated and admitted for pulmonary embolism in addition to her chronic problems.   New events last 24 hours / Subjective: Patient complaining of significant bilateral knee pain.  She is in significant pain and has a hard time participating with exam.  She states that over the past few months, she has had debilitating pain 2-3 times every week.  These can occur in any of her lower extremity joints including her hips, knees, ankles.  These happen to occur spontaneously and at random, involving her joints as well as her muscles.  She also continues to have lower extremity numbness.  She states that she saw a rheumatologist about 40 years ago for fibromyalgia, was placed on pain medication and was referred back to her PCP.  Assessment & Plan:   Principal Problem:   Pulmonary embolism (HCC) Active Problems:   Depression   Hypothyroidism   GERD (gastroesophageal reflux disease)   COPD GOLD II    Hyperlipidemia   Morbid obesity  ([place])   Anal cancer (HCC)   Chronic respiratory failure with hypoxia (HCC)   Weakness of both legs   Falls frequently   PE and DVT -Provoked in setting of immobility, need at least 3 mo anticoagulation treatment -Venous Doppler ultrasound revealed indeterminant DVT of the right lower extremity -Echo without heart strain -Xarelto   Acute hypoxemic respiratory failure -Secondary to above -Continue nasal cannula O2 and wean as able  Lower extremity weakness -Thought to be secondary to myositis -EDP discussed with [name] with neurology who did not feel that MRI would provide more information.  It was felt that her condition is likely just worsened from being deconditioned -PT OT consult, rec SNF  -Due to her continued musculoskeletal pain as well as joint pain, I wonder if she would benefit from rheumatology evaluation.  Unfortunately, rheumatology does not consult in the hospital and patient will need an outpatient referral.  Patient has really not had much improvement on prednisone, states that she had initial improvement but has not had long lasting benefit.  Prednisone is currently being weaned  History of rectal cancer -Follows with Dr. Benay Spice  Hypothyroidism -Continue Synthroid  GERD -Continue Protonix  Depression -Continue Zoloft  Hyperlipidemia -Continue Lipitor    DVT prophylaxis:  Rivaroxaban (XARELTO) tablet 15 mg  rivaroxaban (XARELTO) tablet 20 mg  Code Status: Full code Family Communication: No family at bedside Disposition Plan:  Status is: Inpatient  Remains inpatient appropriate because:Ongoing active pain requiring inpatient pain management, Unsafe d/c plan and Inpatient level of care appropriate due to severity of illness   Dispo: The  patient is from: Home              Anticipated d/c is to: SNF              Anticipated d/c date is: 1 day              Patient currently is not medically stable to d/c.  Patient with significant pain today  requiring IV fentanyl.  SNF placement is pending    Consultants:   None  Procedures:   None  Antimicrobials:  Anti-infectives (From admission, onward)   None       Objective: Vitals:   09/28/20 1656 09/28/20 2016 09/29/20 0007 09/29/20 0529  BP: (!) 156/80 (!) 146/83 (!) 143/84 (!) 156/78  Pulse: 93 93 97 88  Resp: 20 16 20 16   Temp: 99 F (37.2 C) 99.1 F (37.3 C) 98.9 F (37.2 C) 98.7 F (37.1 C)  TempSrc: Oral Oral Oral Oral  SpO2: 97% 95% 97% 98%  Weight:      Height:        Intake/Output Summary (Last 24 hours) at 09/29/2020 1247 Last data filed at 09/29/2020 0600 Gross per 24 hour  Intake 991.33 ml  Output 200 ml  Net 791.33 ml   Filed Weights   09/28/20 0848  Weight: 99.8 kg    Examination: General exam: Appears very uncomfortable and in pain Respiratory system: Clear to auscultation. Respiratory effort normal. Cardiovascular system: S1 & S2 heard, RRR. No pedal edema. Gastrointestinal system: Abdomen is nondistended, soft and nontender. Normal bowel sounds heard. Central nervous system: Alert and oriented. Non focal exam. Speech clear  Extremities: Symmetric in appearance bilaterally, no joint swelling or erythema Skin: No rashes, lesions or ulcers on exposed skin  Psychiatry: Judgement and insight appear stable. Mood & affect appropriate.   Data Reviewed: I have personally reviewed following labs and imaging studies  CBC: Recent Labs  Lab 09/27/20 1738 09/28/20 0509 09/29/20 0224  WBC 8.0 7.2 8.1  NEUTROABS 7.2  --   --   HGB 13.3 12.2 11.2*  HCT 38.4 35.0* 32.2*  MCV 96.0 96.4 97.3  PLT 127* 119* 937*   Basic Metabolic Panel: Recent Labs  Lab 09/27/20 1738 09/28/20 0509 09/29/20 0224  NA 141 141 140  K 3.7 3.3* 3.8  CL 102 103 103  CO2 29 29 29   GLUCOSE 145* 98 110*  BUN 28* 28* 33*  CREATININE 0.95 0.80 0.80  CALCIUM 9.2 8.8* 8.7*  MG  --   --  2.2   GFR: Estimated Creatinine Clearance: 75.8 mL/min (by C-G formula  based on SCr of 0.8 mg/dL). Liver Function Tests: Recent Labs  Lab 09/28/20 0509  AST 14*  ALT 15  ALKPHOS 60  BILITOT 1.5*  PROT 5.9*  ALBUMIN 3.5   No results for input(s): LIPASE, AMYLASE in the last 168 hours. No results for input(s): AMMONIA in the last 168 hours. Coagulation Profile: Recent Labs  Lab 09/27/20 2327  INR 1.0   Cardiac Enzymes: Recent Labs  Lab 09/27/20 1738  CKTOTAL 34*   BNP (last 3 results) No results for input(s): PROBNP in the last 8760 hours. HbA1C: No results for input(s): HGBA1C in the last 72 hours. CBG: No results for input(s): GLUCAP in the last 168 hours. Lipid Profile: No results for input(s): CHOL, HDL, LDLCALC, TRIG, CHOLHDL, LDLDIRECT in the last 72 hours. Thyroid Function Tests: Recent Labs    09/28/20 0509  TSH 3.125   Anemia Panel: No results for  input(s): VITAMINB12, FOLATE, FERRITIN, TIBC, IRON, RETICCTPCT in the last 72 hours. Sepsis Labs: No results for input(s): PROCALCITON, LATICACIDVEN in the last 168 hours.  Recent Results (from the past 240 hour(s))  Respiratory Panel by RT PCR (Flu A&B, Covid) - Nasopharyngeal Swab     Status: None   Collection Time: 09/27/20  8:33 PM   Specimen: Nasopharyngeal Swab  Result Value Ref Range Status   SARS Coronavirus 2 by RT PCR NEGATIVE NEGATIVE Final    Comment: (NOTE) SARS-CoV-2 target nucleic acids are NOT DETECTED.  The SARS-CoV-2 RNA is generally detectable in upper respiratoy specimens during the acute phase of infection. The lowest concentration of SARS-CoV-2 viral copies this assay can detect is 131 copies/mL. A negative result does not preclude SARS-Cov-2 infection and should not be used as the sole basis for treatment or other patient management decisions. A negative result may occur with  improper specimen collection/handling, submission of specimen other than nasopharyngeal swab, presence of viral mutation(s) within the areas targeted by this assay, and  inadequate number of viral copies (<131 copies/mL). A negative result must be combined with clinical observations, patient history, and epidemiological information. The expected result is Negative.  Fact Sheet for Patients:  PinkCheek.be  Fact Sheet for Healthcare Providers:  GravelBags.it  This test is no t yet approved or cleared by the [place] FDA and  has been authorized for detection and/or diagnosis of SARS-CoV-2 by FDA under an Emergency Use Authorization (EUA). This EUA will remain  in effect (meaning this test can be used) for the duration of the COVID-19 declaration under Section 564(b)(1) of the Act, 21 U.S.C. section 360bbb-3(b)(1), unless the authorization is terminated or revoked sooner.     Influenza A by PCR NEGATIVE NEGATIVE Final   Influenza B by PCR NEGATIVE NEGATIVE Final    Comment: (NOTE) The Xpert Xpress SARS-CoV-2/FLU/RSV assay is intended as an aid in  the diagnosis of influenza from Nasopharyngeal swab specimens and  should not be used as a sole basis for treatment. Nasal washings and  aspirates are unacceptable for Xpert Xpress SARS-CoV-2/FLU/RSV  testing.  Fact Sheet for Patients: PinkCheek.be  Fact Sheet for Healthcare Providers: GravelBags.it  This test is not yet approved or cleared by the [place] FDA and  has been authorized for detection and/or diagnosis of SARS-CoV-2 by  FDA under an Emergency Use Authorization (EUA). This EUA will remain  in effect (meaning this test can be used) for the duration of the  Covid-19 declaration under Section 564(b)(1) of the Act, 21  U.S.C. section 360bbb-3(b)(1), unless the authorization is  terminated or revoked. Performed at [facility], [place] [address], [place], [place] [address]       Radiology Studies: CT Angio Chest PE W and/or Wo Contrast  Result  Date: 09/27/2020 CLINICAL DATA:  Weakness fell in bathroom EXAM: CT ANGIOGRAPHY CHEST WITH CONTRAST TECHNIQUE: Multidetector CT imaging of the chest was performed using the standard protocol during bolus administration of intravenous contrast. Multiplanar CT image reconstructions and MIPs were obtained to evaluate the vascular anatomy. CONTRAST:  117mL OMNIPAQUE IOHEXOL 350 MG/ML SOLN COMPARISON:  Chest x-ray 09/27/2020, CT chest 10/12/2019 FINDINGS: Cardiovascular: Satisfactory opacification of the pulmonary arteries to the segmental level. Acute thrombus straddling left upper lobe pulmonary vessel to the descending left lower lobe pulmonary artery. Extension of thrombus into left upper and left lower lobe segmental vessels. No definitive right-sided emboli. Elevation of RV LV ratio which measures 1.3. Mild cardiomegaly. No pericardial effusion.  Moderate aortic atherosclerosis. Ectatic ascending aorta without aneurysm. Mild coronary vascular calcification. No pericardial effusion. Mediastinum/Nodes: Midline trachea. Stable partially calcified subcentimeter nodule left lobe of thyroid, no further workup recommended. No suspicious adenopathy. Esophagus within normal limits. Lungs/Pleura: Moderate emphysema. No acute consolidation or effusion. No pneumothorax. Upper Abdomen: Calcified granuloma in the spleen. Status post cholecystectomy. No acute abnormality Musculoskeletal: No chest wall abnormality. No acute or significant osseous findings. Review of the MIP images confirms the above findings. IMPRESSION: 1. Positive for acute left-sided pulmonary emboli. Positive for acute PE with CT evidence of right heart strain (RV/LV Ratio = 1.3) consistent with at least submassive (intermediate risk) PE. The presence of right heart strain has been associated with an increased risk of morbidity and mortality. 2. Emphysema. No acute consolidation or effusion. 3. Emphysema and aortic atherosclerosis. Critical Value/emergent  results were called by telephone at the time of interpretation on 09/27/2020 at 9:43 pm to provider [facility] , who verbally acknowledged these results. Aortic Atherosclerosis (ICD10-I70.0) and Emphysema (ICD10-J43.9). Electronically Signed   By: Donavan Foil M.D.   On: 09/27/2020 21:43   DG Chest Port 1 View  Result Date: 09/27/2020 CLINICAL DATA:  Weakness EXAM: PORTABLE CHEST 1 VIEW COMPARISON:  11/16/2019 FINDINGS: The heart size and mediastinal contours are within normal limits. Both lungs are clear. The visualized skeletal structures are unremarkable. Aortic atherosclerosis. IMPRESSION: No active disease. Electronically Signed   By: Donavan Foil M.D.   On: 09/27/2020 18:32   ECHOCARDIOGRAM COMPLETE  Result Date: 09/28/2020    ECHOCARDIOGRAM REPORT   Patient Name:   AAYAT HAJJAR Date of Exam: 09/28/2020 Medical Rec #:  [id number]       Height:       66.0 in Accession #:    [id number]      Weight:       220.0 lb Date of Birth:  11/30/1946        BSA:          2.082 m Patient Age:    63 years        BP:           157/85 mmHg Patient Gender: F               HR:           84 bpm. Exam Location:  Inpatient Procedure: 2D Echo, Cardiac Doppler, Color Doppler and Intracardiac            Opacification Agent Indications:    Pulmonary Embolus 415.19 / I26.99  History:        Patient has no prior history of Echocardiogram examinations.                 COPD; Risk Factors:Sleep Apnea, Dyslipidemia and Former Smoker.                 GERD.  Sonographer:    Vickie Epley RDCS Referring Phys: [id number] [facility] Julious Oka  Sonographer Comments: Suboptimal parasternal window. IMPRESSIONS  1. Left ventricular ejection fraction, by estimation, is 60 to 65%. The left ventricle has normal function. The left ventricle has no regional wall motion abnormalities. Left ventricular diastolic parameters are consistent with Grade I diastolic dysfunction (impaired relaxation).  2. Right ventricular systolic function is normal. The right  ventricular size is normal.  3. The mitral valve was not well visualized. No evidence of mitral valve regurgitation.  4. The aortic valve is grossly normal. Aortic valve regurgitation is not visualized. No aortic  stenosis is present. FINDINGS  Left Ventricle: Left ventricular ejection fraction, by estimation, is 60 to 65%. The left ventricle has normal function. The left ventricle has no regional wall motion abnormalities. Definity contrast agent was given IV to delineate the left ventricular  endocardial borders. The left ventricular internal cavity size was normal in size. There is no left ventricular hypertrophy. Left ventricular diastolic parameters are consistent with Grade I diastolic dysfunction (impaired relaxation). Right Ventricle: The right ventricular size is normal. No increase in right ventricular wall thickness. Right ventricular systolic function is normal. Left Atrium: Left atrial size was normal in size. Right Atrium: Right atrial size was normal in size. Pericardium: There is no evidence of pericardial effusion. Mitral Valve: The mitral valve was not well visualized. No evidence of mitral valve regurgitation. Tricuspid Valve: The tricuspid valve is normal in structure. Tricuspid valve regurgitation is not demonstrated. Aortic Valve: The aortic valve is grossly normal. Aortic valve regurgitation is not visualized. No aortic stenosis is present. Pulmonic Valve: The pulmonic valve was normal in structure. Pulmonic valve regurgitation is not visualized. Aorta: The aortic root was not well visualized. IAS/Shunts: The atrial septum is grossly normal.  LEFT VENTRICLE PLAX 2D LVOT diam:     2.20 cm     Diastology LV SV:         94          LV e' medial:    5.66 cm/s LV SV Index:   45          LV E/e' medial:  10.6 LVOT Area:     3.80 cm    LV e' lateral:   7.18 cm/s                            LV E/e' lateral: 8.4  LV Volumes (MOD) LV vol d, MOD A2C: 80.7 ml LV vol d, MOD A4C: 86.6 ml LV vol s, MOD A2C:  27.5 ml LV vol s, MOD A4C: 28.8 ml LV SV MOD A2C:     53.2 ml LV SV MOD A4C:     86.6 ml LV SV MOD BP:      56.3 ml RIGHT VENTRICLE RV S prime:     8.38 cm/s TAPSE (M-mode): 1.5 cm LEFT ATRIUM           Index       RIGHT ATRIUM           Index LA Vol (A2C): 19.1 ml 9.17 ml/m  RA Area:     11.20 cm LA Vol (A4C): 27.7 ml 13.30 ml/m RA Volume:   24.10 ml  11.57 ml/m  AORTIC VALVE LVOT Vmax:   140.00 cm/s LVOT Vmean:  90.400 cm/s LVOT VTI:    0.246 m  AORTA Ao Root diam: 3.20 cm MITRAL VALVE MV Area (PHT): 3.23 cm    SHUNTS MV Decel Time: 235 msec    Systemic VTI:  0.25 m MV E velocity: 60.10 cm/s  Systemic Diam: 2.20 cm MV A velocity: 75.70 cm/s MV E/A ratio:  0.79 Mertie Moores MD Electronically signed by Mertie Moores MD Signature Date/Time: 09/28/2020/2:09:58 PM    Final    VAS [place] LOWER EXTREMITY VENOUS (DVT)  Result Date: 09/28/2020  Lower Venous DVTStudy Indications: Pulmonary embolism.  Anticoagulation: Heparin. Limitations: Poor ultrasound/tissue interface and bandages. Comparison Study: 10-13-2019 Prior bilateral lower extremity venous. Performing Technologist: Darlin Coco  Examination Guidelines: A complete evaluation includes B-mode imaging, spectral Doppler,  color Doppler, and power Doppler as needed of all accessible portions of each vessel. Bilateral testing is considered an integral part of a complete examination. Limited examinations for reoccurring indications may be performed as noted. The reflux portion of the exam is performed with the patient in reverse Trendelenburg.  +---------+---------------+---------+-----------+----------+---------------+ RIGHT    CompressibilityPhasicitySpontaneityPropertiesThrombus Aging  +---------+---------------+---------+-----------+----------+---------------+ CFV      Full           Yes      Yes                                  +---------+---------------+---------+-----------+----------+---------------+ SFJ      Full                                                          +---------+---------------+---------+-----------+----------+---------------+ FV Prox  Full                                                         +---------+---------------+---------+-----------+----------+---------------+ FV Mid   Full                                                         +---------+---------------+---------+-----------+----------+---------------+ FV DistalFull                                                         +---------+---------------+---------+-----------+----------+---------------+ PFV      Full                                                         +---------+---------------+---------+-----------+----------+---------------+ POP      Full           Yes      Yes                                  +---------+---------------+---------+-----------+----------+---------------+ PTV      Partial                                                      +---------+---------------+---------+-----------+----------+---------------+ PERO                                                  Patent by color +---------+---------------+---------+-----------+----------+---------------+   +---------+---------------+---------+-----------+----------+-------------------+  LEFT     CompressibilityPhasicitySpontaneityPropertiesThrombus Aging      +---------+---------------+---------+-----------+----------+-------------------+ CFV      Full           Yes      Yes                                      +---------+---------------+---------+-----------+----------+-------------------+ SFJ      Full                                                             +---------+---------------+---------+-----------+----------+-------------------+ FV Prox  Full                                                             +---------+---------------+---------+-----------+----------+-------------------+ FV Mid   Full                                                              +---------+---------------+---------+-----------+----------+-------------------+ FV DistalFull                                                             +---------+---------------+---------+-----------+----------+-------------------+ PFV      Full                                                             +---------+---------------+---------+-----------+----------+-------------------+ POP      Full           Yes      Yes                                      +---------+---------------+---------+-----------+----------+-------------------+ PTV      Full                                         Some segments not                                                         visualized due to  bandages            +---------+---------------+---------+-----------+----------+-------------------+ PERO                                                  Patent by color-                                                          some segments not                                                         visualized due to                                                         bandages            +---------+---------------+---------+-----------+----------+-------------------+     Summary: RIGHT: - Findings consistent with age indeterminate deep vein thrombosis involving the right posterior tibial veins. - No cystic structure found in the popliteal fossa.  LEFT: - There is no evidence of deep vein thrombosis in the lower extremity.  - No cystic structure found in the popliteal fossa.  *See table(s) above for measurements and observations. Electronically signed by Servando Snare MD on 09/28/2020 at 5:34:50 PM.    Final       Scheduled Meds: . atorvastatin  20 mg Oral Once per day on Sun Tue Thu Sat  . cholecalciferol  1,000 Units Oral Daily  . levothyroxine  75 mcg  Oral QAC breakfast  . oxyCODONE  10 mg Oral Q12H  . pantoprazole  40 mg Oral Daily  . polyethylene glycol  17 g Oral QHS  . predniSONE  30 mg Oral Q breakfast  . rivaroxaban  15 mg Oral BID WC   Followed by  . [START ON 10/20/2020] rivaroxaban  20 mg Oral Q supper  . sertraline  200 mg Oral QPM   Continuous Infusions:    LOS: 2 days      Time spent: 40 minutes   Dessa Phi, DO [facility] 09/29/2020, 12:47 PM   Available via Epic secure chat 7am-7pm After these hours, please refer to coverage provider listed on [email or web address]

## 2020-09-29 NOTE — Progress Notes (Signed)
ANTICOAGULATION CONSULT NOTE - Follow Consult  Pharmacy Consult for heparin - transition to Xarelto 10/9 Indication: pulmonary embolus  Allergies  Allergen Reactions  . Prozac [Fluoxetine Hcl] Anaphylaxis  . Codeine Itching  . Morphine And Related Itching  . Sulfa Antibiotics Itching and Nausea Only    Patient Measurements: Height: 5\' 6"  (167.6 cm) Weight: 99.8 kg (220 lb) IBW/kg (Calculated) : 59.3 Heparin Dosing Weight: 81.8 kg  Vital Signs: Temp: 98.7 F (37.1 C) (10/09 0529) Temp Source: Oral (10/09 0529) BP: 156/78 (10/09 0529) Pulse Rate: 88 (10/09 0529)  Labs: Recent Labs    09/27/20 1738 09/27/20 1738 09/27/20 2327 09/28/20 0509 09/28/20 0730 09/28/20 1749 09/29/20 0224  HGB 13.3   < >  --  12.2  --   --  11.2*  HCT 38.4  --   --  35.0*  --   --  32.2*  PLT 127*  --   --  119*  --   --  123*  APTT  --   --  22*  --   --   --   --   LABPROT  --   --  12.7  --   --   --   --   INR  --   --  1.0  --   --   --   --   HEPARINUNFRC  --   --   --   --  1.34* 0.60 0.86*  CREATININE 0.95  --   --  0.80  --   --  0.80  CKTOTAL 34*  --   --   --   --   --   --    < > = values in this interval not displayed.    Estimated Creatinine Clearance: 75.8 mL/min (by C-G formula based on SCr of 0.8 mg/dL).   Medical History: Past Medical History:  Diagnosis Date  . Allergic rhinitis   . Arthritis   . Bursitis    Both Shoulders  . Chronic low back pain   . Degenerative disk disease   . Depression   . Diverticulitis   . Emphysema of lung ([place])   . Fibromyalgia   . GERD (gastroesophageal reflux disease)   . History of fibromyalgia   . Hypertension   . Hypothyroidism   . Irritable bowel syndrome   . Neuralgia, post-herpetic   . Obesity, unspecified   . Obstructive sleep apnea   . OCD (obsessive compulsive disorder)   . Pain in joint, site unspecified   . Pure hypercholesterolemia   . Scoliosis   . Situational stress   . Spastic colon   . Tubular adenoma    . Uterine cancer [facility])      Assessment: 74 year old female with a history of prior rectal cancer status post chemotherapy and radiation, obesity, COPD, hypothyroidism who is presenting today after a fall at home.   CTA is positive for PE , pharmacy to dose heparin drip.  No prior AC noted  To change IV heparin to Xarelto 10/9  Goal of Therapy:  Heparin level 0.3-0.7 units/ml Monitor platelets by anticoagulation protocol:   Plan:   Discontinue IV heparin now  Start Xarelto 15mg  PO BID x 21 days then 20mg  PO once daily  Will counsel prior to discharge  Adrian Saran, PharmD, BCPS 09/29/2020 8:58 AM

## 2020-09-30 ENCOUNTER — Inpatient Hospital Stay (HOSPITAL_COMMUNITY): Payer: Medicare Other

## 2020-09-30 DIAGNOSIS — I2699 Other pulmonary embolism without acute cor pulmonale: Secondary | ICD-10-CM | POA: Diagnosis not present

## 2020-09-30 LAB — CBC
HCT: 28.6 % — ABNORMAL LOW (ref 36.0–46.0)
Hemoglobin: 9.6 g/dL — ABNORMAL LOW (ref 12.0–15.0)
MCH: 33.6 pg (ref 26.0–34.0)
MCHC: 33.6 g/dL (ref 30.0–36.0)
MCV: 100 fL (ref 80.0–100.0)
Platelets: 133 10*3/uL — ABNORMAL LOW (ref 150–400)
RBC: 2.86 MIL/uL — ABNORMAL LOW (ref 3.87–5.11)
RDW: 13.7 % (ref 11.5–15.5)
WBC: 8.4 10*3/uL (ref 4.0–10.5)
nRBC: 0 % (ref 0.0–0.2)

## 2020-09-30 LAB — URIC ACID: Uric Acid, Serum: 3.8 mg/dL (ref 2.5–7.1)

## 2020-09-30 LAB — SEDIMENTATION RATE: Sed Rate: 44 mm/hr — ABNORMAL HIGH (ref 0–22)

## 2020-09-30 MED ORDER — PANTOPRAZOLE SODIUM 40 MG PO TBEC
40.0000 mg | DELAYED_RELEASE_TABLET | Freq: Two times a day (BID) | ORAL | Status: DC
  Administered 2020-09-30 – 2020-10-10 (×22): 40 mg via ORAL
  Filled 2020-09-30 (×22): qty 1

## 2020-09-30 MED ORDER — METHYLPREDNISOLONE SODIUM SUCC 125 MG IJ SOLR
62.5000 mg | Freq: Every day | INTRAMUSCULAR | Status: DC
  Administered 2020-09-30 – 2020-10-09 (×10): 62.5 mg via INTRAVENOUS
  Filled 2020-09-30 (×10): qty 2

## 2020-09-30 NOTE — Plan of Care (Signed)
  Problem: Clinical Measurements: Goal: Ability to maintain clinical measurements within normal limits will improve Outcome: Progressing   Problem: Skin Integrity: Goal: Demonstration of wound healing without infection will improve Outcome: Progressing   Problem: Education: Goal: Knowledge of General Education information will improve Description: Including pain rating scale, medication(s)/side effects and non-pharmacologic comfort measures Outcome: Progressing   Problem: Clinical Measurements: Goal: Ability to maintain clinical measurements within normal limits will improve Outcome: Progressing Goal: Will remain free from infection Outcome: Progressing Goal: Diagnostic test results will improve Outcome: Progressing Goal: Respiratory complications will improve Outcome: Progressing   Problem: Activity: Goal: Risk for activity intolerance will decrease Outcome: Progressing   Problem: Nutrition: Goal: Adequate nutrition will be maintained Outcome: Progressing   Problem: Coping: Goal: Level of anxiety will decrease Outcome: Progressing   Problem: Pain Managment: Goal: General experience of comfort will improve Outcome: Progressing   Problem: Safety: Goal: Ability to remain free from injury will improve Outcome: Progressing   Problem: Skin Integrity: Goal: Risk for impaired skin integrity will decrease Outcome: Progressing

## 2020-09-30 NOTE — Progress Notes (Signed)
PROGRESS NOTE    Joanne Klein  JJH:[id number] DOB: 1946-08-19 DOA: 09/27/2020 PCP: Hoyt Koch, MD     Brief Narrative:  Joanne Klein is a 74 y.o. female with medical history significant of rectal cancer status post chemotherapy and radiation therapy who developed significant myopathy following treatment, and has significant lower extremity weakness, neuralgia, known degenerative disc disease of the lumbar spine, GERD, hypertension, fibromyalgia, overall weakness with poor mobility, morbid obesity as well as obstructive sleep apnea, hypertension, hypothyroidism, who presented to the hospital with worsening weakness as well as another fall.  She was evaluated and was being admitted with failure to thrive at home due to weakness with plan for possible PT OT and placement.  While being evaluated, patient was noted to be hypoxic and also tachycardic. Work-up with CT angiogram of the chest showed pulmonary embolism.  Patient has been placed on oxygen and is being treated and admitted for pulmonary embolism in addition to her chronic problems.   New events last 24 hours / Subjective: States that she has continued to have some bilateral knee pain, but has been better on pain medications.  Still unable to walk due to significant pain and weakness.  Assessment & Plan:   Principal Problem:   Pulmonary embolism (HCC) Active Problems:   Depression   Hypothyroidism   GERD (gastroesophageal reflux disease)   COPD GOLD II    Hyperlipidemia   Morbid obesity ([place])   Anal cancer (HCC)   Chronic respiratory failure with hypoxia (HCC)   Weakness of both legs   Falls frequently   PE and DVT -Provoked in setting of immobility, need at least 3 mo anticoagulation treatment -Venous Doppler ultrasound revealed indeterminant DVT of the right lower extremity -Echo without heart strain -Xarelto   Acute hypoxemic respiratory failure -Secondary to above -Continue nasal cannula O2 and wean as  able  Lower extremity weakness -Thought to be secondary to myositis -EDP discussed with [name] with neurology who did not feel that MRI would provide more information.  It was felt that her condition is likely just worsened from being deconditioned -PT OT consult, rec SNF  -Due to her continued musculoskeletal pain as well as joint pain, I wonder if she would benefit from rheumatology evaluation.  Unfortunately, rheumatology does not consult in the hospital and patient will need an outpatient referral.  Patient has really not had much improvement on prednisone, states that she had initial improvement but has not had long lasting benefit.  Prednisone is currently being weaned -She initially had some improvement on 60 mg of prednisone over the summer, this has been slowly weaned to 30 mg daily.  I am going to increase it back to 60 mg Solu-Medrol to see if this will help. -Will check rheum labs as well as bilateral knee xrays. Today's main complaint are bilateral knee pain   History of rectal cancer -Follows with Dr. Benay Spice  Hypothyroidism -Continue Synthroid  GERD -Continue Protonix  Depression -Continue Zoloft  Hyperlipidemia -Continue Lipitor    DVT prophylaxis:  Rivaroxaban (XARELTO) tablet 15 mg  rivaroxaban (XARELTO) tablet 20 mg  Code Status: Full code Family Communication: No family at bedside, left a voicemail on niece's phone to call us back with updates Disposition Plan:  Status is: Inpatient  Remains inpatient appropriate because:Ongoing active pain requiring inpatient pain management, Unsafe d/c plan and Inpatient level of care appropriate due to severity of illness   Dispo: The patient is from: Home  Anticipated d/c is to: SNF              Anticipated d/c date is: 1 day              Patient currently is not medically stable to d/c.  Trial IV Solu-Medrol today.  SNF placement is pending    Consultants:   None  Procedures:    None  Antimicrobials:  Anti-infectives (From admission, onward)   None       Objective: Vitals:   09/29/20 0529 09/29/20 2100 09/30/20 0625 09/30/20 1227  BP: (!) 156/78 (!) 115/59 124/73 126/64  Pulse: 88 96 99 95  Resp: 16 16 16 20   Temp: 98.7 F (37.1 C) 99.4 F (37.4 C) 98.2 F (36.8 C) 98.9 F (37.2 C)  TempSrc: Oral Oral Oral Oral  SpO2: 98% 100% 98% 96%  Weight:      Height:        Intake/Output Summary (Last 24 hours) at 09/30/2020 1239 Last data filed at 09/30/2020 0600 Gross per 24 hour  Intake 480 ml  Output 600 ml  Net -120 ml   Filed Weights   09/28/20 0848  Weight: 99.8 kg    Examination: General exam: Appears uncomfortable Respiratory system: Clear to auscultation. Respiratory effort normal. Cardiovascular system: S1 & S2 heard, RRR. No pedal edema. Gastrointestinal system: Abdomen is nondistended, soft and nontender. Normal bowel sounds heard. Central nervous system: Alert and oriented. Non focal exam. Speech clear  Extremities: Symmetric in appearance bilaterally, no joint swelling or erythema or warmth Skin: No rashes, lesions or ulcers on exposed skin  Psychiatry: Judgement and insight appear stable. Mood & affect appropriate.    Data Reviewed: I have personally reviewed following labs and imaging studies  CBC: Recent Labs  Lab 09/27/20 1738 09/28/20 0509 09/29/20 0224 09/30/20 0535  WBC 8.0 7.2 8.1 8.4  NEUTROABS 7.2  --   --   --   HGB 13.3 12.2 11.2* 9.6*  HCT 38.4 35.0* 32.2* 28.6*  MCV 96.0 96.4 97.3 100.0  PLT 127* 119* 123* 160*   Basic Metabolic Panel: Recent Labs  Lab 09/27/20 1738 09/28/20 0509 09/29/20 0224  NA 141 141 140  K 3.7 3.3* 3.8  CL 102 103 103  CO2 29 29 29   GLUCOSE 145* 98 110*  BUN 28* 28* 33*  CREATININE 0.95 0.80 0.80  CALCIUM 9.2 8.8* 8.7*  MG  --   --  2.2   GFR: Estimated Creatinine Clearance: 75.8 mL/min (by C-G formula based on SCr of 0.8 mg/dL). Liver Function Tests: Recent Labs   Lab 09/28/20 0509  AST 14*  ALT 15  ALKPHOS 60  BILITOT 1.5*  PROT 5.9*  ALBUMIN 3.5   No results for input(s): LIPASE, AMYLASE in the last 168 hours. No results for input(s): AMMONIA in the last 168 hours. Coagulation Profile: Recent Labs  Lab 09/27/20 2327  INR 1.0   Cardiac Enzymes: Recent Labs  Lab 09/27/20 1738  CKTOTAL 34*   BNP (last 3 results) No results for input(s): PROBNP in the last 8760 hours. HbA1C: No results for input(s): HGBA1C in the last 72 hours. CBG: No results for input(s): GLUCAP in the last 168 hours. Lipid Profile: No results for input(s): CHOL, HDL, LDLCALC, TRIG, CHOLHDL, LDLDIRECT in the last 72 hours. Thyroid Function Tests: Recent Labs    09/28/20 0509  TSH 3.125   Anemia Panel: No results for input(s): VITAMINB12, FOLATE, FERRITIN, TIBC, IRON, RETICCTPCT in the last 72 hours. Sepsis Labs:  No results for input(s): PROCALCITON, LATICACIDVEN in the last 168 hours.  Recent Results (from the past 240 hour(s))  Respiratory Panel by RT PCR (Flu A&B, Covid) - Nasopharyngeal Swab     Status: None   Collection Time: 09/27/20  8:33 PM   Specimen: Nasopharyngeal Swab  Result Value Ref Range Status   SARS Coronavirus 2 by RT PCR NEGATIVE NEGATIVE Final    Comment: (NOTE) SARS-CoV-2 target nucleic acids are NOT DETECTED.  The SARS-CoV-2 RNA is generally detectable in upper respiratoy specimens during the acute phase of infection. The lowest concentration of SARS-CoV-2 viral copies this assay can detect is 131 copies/mL. A negative result does not preclude SARS-Cov-2 infection and should not be used as the sole basis for treatment or other patient management decisions. A negative result may occur with  improper specimen collection/handling, submission of specimen other than nasopharyngeal swab, presence of viral mutation(s) within the areas targeted by this assay, and inadequate number of viral copies (<131 copies/mL). A negative result  must be combined with clinical observations, patient history, and epidemiological information. The expected result is Negative.  Fact Sheet for Patients:  PinkCheek.be  Fact Sheet for Healthcare Providers:  GravelBags.it  This test is no t yet approved or cleared by the [place] FDA and  has been authorized for detection and/or diagnosis of SARS-CoV-2 by FDA under an Emergency Use Authorization (EUA). This EUA will remain  in effect (meaning this test can be used) for the duration of the COVID-19 declaration under Section 564(b)(1) of the Act, 21 U.S.C. section 360bbb-3(b)(1), unless the authorization is terminated or revoked sooner.     Influenza A by PCR NEGATIVE NEGATIVE Final   Influenza B by PCR NEGATIVE NEGATIVE Final    Comment: (NOTE) The Xpert Xpress SARS-CoV-2/FLU/RSV assay is intended as an aid in  the diagnosis of influenza from Nasopharyngeal swab specimens and  should not be used as a sole basis for treatment. Nasal washings and  aspirates are unacceptable for Xpert Xpress SARS-CoV-2/FLU/RSV  testing.  Fact Sheet for Patients: PinkCheek.be  Fact Sheet for Healthcare Providers: GravelBags.it  This test is not yet approved or cleared by the [place] FDA and  has been authorized for detection and/or diagnosis of SARS-CoV-2 by  FDA under an Emergency Use Authorization (EUA). This EUA will remain  in effect (meaning this test can be used) for the duration of the  Covid-19 declaration under Section 564(b)(1) of the Act, 21  U.S.C. section 360bbb-3(b)(1), unless the authorization is  terminated or revoked. Performed at [facility], [place] [address] [place], [place] [address]       Radiology Studies: ECHOCARDIOGRAM COMPLETE  Result Date: 09/28/2020    ECHOCARDIOGRAM REPORT   Patient Name:   LOVA URBIETA Date of Exam:  09/28/2020 Medical Rec #:  [id number]       Height:       66.0 in Accession #:    [id number]      Weight:       220.0 lb Date of Birth:  12/09/1946        BSA:          2.082 m Patient Age:    9 years        BP:           157/85 mmHg Patient Gender: F               HR:           84  bpm. Exam Location:  Inpatient Procedure: 2D Echo, Cardiac Doppler, Color Doppler and Intracardiac            Opacification Agent Indications:    Pulmonary Embolus 415.19 / I26.99  History:        Patient has no prior history of Echocardiogram examinations.                 COPD; Risk Factors:Sleep Apnea, Dyslipidemia and Former Smoker.                 GERD.  Sonographer:    Vickie Epley RDCS Referring Phys: [id number] Julious Oka  Sonographer Comments: Suboptimal parasternal window. IMPRESSIONS  1. Left ventricular ejection fraction, by estimation, is 60 to 65%. The left ventricle has normal function. The left ventricle has no regional wall motion abnormalities. Left ventricular diastolic parameters are consistent with Grade I diastolic dysfunction (impaired relaxation).  2. Right ventricular systolic function is normal. The right ventricular size is normal.  3. The mitral valve was not well visualized. No evidence of mitral valve regurgitation.  4. The aortic valve is grossly normal. Aortic valve regurgitation is not visualized. No aortic stenosis is present. FINDINGS  Left Ventricle: Left ventricular ejection fraction, by estimation, is 60 to 65%. The left ventricle has normal function. The left ventricle has no regional wall motion abnormalities. Definity contrast agent was given IV to delineate the left ventricular  endocardial borders. The left ventricular internal cavity size was normal in size. There is no left ventricular hypertrophy. Left ventricular diastolic parameters are consistent with Grade I diastolic dysfunction (impaired relaxation). Right Ventricle: The right ventricular size is normal. No increase in right ventricular wall  thickness. Right ventricular systolic function is normal. Left Atrium: Left atrial size was normal in size. Right Atrium: Right atrial size was normal in size. Pericardium: There is no evidence of pericardial effusion. Mitral Valve: The mitral valve was not well visualized. No evidence of mitral valve regurgitation. Tricuspid Valve: The tricuspid valve is normal in structure. Tricuspid valve regurgitation is not demonstrated. Aortic Valve: The aortic valve is grossly normal. Aortic valve regurgitation is not visualized. No aortic stenosis is present. Pulmonic Valve: The pulmonic valve was normal in structure. Pulmonic valve regurgitation is not visualized. Aorta: The aortic root was not well visualized. IAS/Shunts: The atrial septum is grossly normal.  LEFT VENTRICLE PLAX 2D LVOT diam:     2.20 cm     Diastology LV SV:         94          LV e' medial:    5.66 cm/s LV SV Index:   45          LV E/e' medial:  10.6 LVOT Area:     3.80 cm    LV e' lateral:   7.18 cm/s                            LV E/e' lateral: 8.4  LV Volumes (MOD) LV vol d, MOD A2C: 80.7 ml LV vol d, MOD A4C: 86.6 ml LV vol s, MOD A2C: 27.5 ml LV vol s, MOD A4C: 28.8 ml LV SV MOD A2C:     53.2 ml LV SV MOD A4C:     86.6 ml LV SV MOD BP:      56.3 ml RIGHT VENTRICLE RV S prime:     8.38 cm/s TAPSE (M-mode): 1.5 cm LEFT ATRIUM  Index       RIGHT ATRIUM           Index LA Vol (A2C): 19.1 ml 9.17 ml/m  RA Area:     11.20 cm LA Vol (A4C): 27.7 ml 13.30 ml/m RA Volume:   24.10 ml  11.57 ml/m  AORTIC VALVE LVOT Vmax:   140.00 cm/s LVOT Vmean:  90.400 cm/s LVOT VTI:    0.246 m  AORTA Ao Root diam: 3.20 cm MITRAL VALVE MV Area (PHT): 3.23 cm    SHUNTS MV Decel Time: 235 msec    Systemic VTI:  0.25 m MV E velocity: 60.10 cm/s  Systemic Diam: 2.20 cm MV A velocity: 75.70 cm/s MV E/A ratio:  0.79 Mertie Moores MD Electronically signed by Mertie Moores MD Signature Date/Time: 09/28/2020/2:09:58 PM    Final       Scheduled Meds: . atorvastatin   20 mg Oral Once per day on Sun Tue Thu Sat  . cholecalciferol  1,000 Units Oral Daily  . levothyroxine  75 mcg Oral QAC breakfast  . methylPREDNISolone (SOLU-MEDROL) injection  62.5 mg Intravenous Daily  . oxyCODONE  10 mg Oral Q12H  . pantoprazole  40 mg Oral BID AC  . polyethylene glycol  17 g Oral QHS  . rivaroxaban  15 mg Oral BID WC   Followed by  . [START ON 10/20/2020] rivaroxaban  20 mg Oral Q supper  . sertraline  200 mg Oral QPM   Continuous Infusions:    LOS: 3 days      Time spent: 40 minutes   Dessa Phi, DO [facility] 09/30/2020, 12:39 PM   Available via Epic secure chat 7am-7pm After these hours, please refer to coverage provider listed on [email or web address]

## 2020-10-01 DIAGNOSIS — M79606 Pain in leg, unspecified: Secondary | ICD-10-CM | POA: Diagnosis not present

## 2020-10-01 DIAGNOSIS — I951 Orthostatic hypotension: Secondary | ICD-10-CM

## 2020-10-01 DIAGNOSIS — J449 Chronic obstructive pulmonary disease, unspecified: Secondary | ICD-10-CM | POA: Diagnosis not present

## 2020-10-01 DIAGNOSIS — I2699 Other pulmonary embolism without acute cor pulmonale: Secondary | ICD-10-CM

## 2020-10-01 DIAGNOSIS — Z9181 History of falling: Secondary | ICD-10-CM

## 2020-10-01 DIAGNOSIS — C210 Malignant neoplasm of anus, unspecified: Secondary | ICD-10-CM | POA: Diagnosis not present

## 2020-10-01 LAB — CBC
HCT: 25.8 % — ABNORMAL LOW (ref 36.0–46.0)
Hemoglobin: 8.8 g/dL — ABNORMAL LOW (ref 12.0–15.0)
MCH: 33.5 pg (ref 26.0–34.0)
MCHC: 34.1 g/dL (ref 30.0–36.0)
MCV: 98.1 fL (ref 80.0–100.0)
Platelets: 135 10*3/uL — ABNORMAL LOW (ref 150–400)
RBC: 2.63 MIL/uL — ABNORMAL LOW (ref 3.87–5.11)
RDW: 13.4 % (ref 11.5–15.5)
WBC: 9.3 10*3/uL (ref 4.0–10.5)
nRBC: 0 % (ref 0.0–0.2)

## 2020-10-01 LAB — RHEUMATOID FACTOR: Rheumatoid fact SerPl-aCnc: <10 IU/mL (ref 0.0–13.9)

## 2020-10-01 LAB — ANA W/REFLEX IF POSITIVE: Anti Nuclear Antibody (ANA): NEGATIVE

## 2020-10-01 LAB — C-REACTIVE PROTEIN: CRP: 6.4 mg/dL — ABNORMAL HIGH (ref <1.0)

## 2020-10-01 LAB — CYCLIC CITRUL PEPTIDE ANTIBODY, IGG/IGA: CCP Antibodies IgG/IgA: 5 units (ref 0–19)

## 2020-10-01 MED ORDER — PROCHLORPERAZINE MALEATE 10 MG PO TABS: 10.0000 mg | ORAL_TABLET | Freq: Four times a day (QID) | ORAL | Status: DC | PRN

## 2020-10-01 MED ORDER — OXYCODONE HCL 5 MG PO TABS
10.0000 mg | ORAL_TABLET | ORAL | Status: DC
  Administered 2020-10-01 – 2020-10-10 (×55): 10 mg via ORAL
  Filled 2020-10-01 (×56): qty 2

## 2020-10-01 MED ORDER — KETOROLAC TROMETHAMINE 15 MG/ML IJ SOLN
15.0000 mg | Freq: Three times a day (TID) | INTRAMUSCULAR | Status: DC
  Administered 2020-10-01 – 2020-10-02 (×3): 15 mg via INTRAVENOUS
  Filled 2020-10-01 (×3): qty 1

## 2020-10-01 MED ORDER — CALCIUM CARBONATE ANTACID 500 MG PO CHEW: 1.0000 | CHEWABLE_TABLET | Freq: Every day | ORAL | Status: DC | PRN

## 2020-10-01 MED ORDER — PREGABALIN 75 MG PO CAPS
75.0000 mg | ORAL_CAPSULE | Freq: Two times a day (BID) | ORAL | Status: DC
  Administered 2020-10-01 – 2020-10-10 (×19): 75 mg via ORAL
  Filled 2020-10-01 (×20): qty 1

## 2020-10-01 NOTE — Progress Notes (Addendum)
IP PROGRESS NOTE  Subjective:   She continues to have lower extremity weakness and pain. States that her legs feel "numb." Reports low back pain consistent with baseline. Denies headaches and dizziness. No confusion reported.  Objective: Vital signs in last 24 hours: Blood pressure 119/61, pulse (!) 102, temperature 99.5 F (37.5 C), temperature source Oral, resp. rate 16, height 5\' 6"  (1.676 m), weight 99.8 kg, SpO2 96 %.  Intake/Output from previous day: 10/10 0701 - 10/11 0700 In: 360 [P.O.:360] Out: 450 [Urine:450]  Physical Exam:  HEENT: No thrush Lungs: Clear bilaterally Cardiac: Regular rate and rhythm Abdomen: No hepatosplenomegaly, nontender, no mass Extremities: No leg edema Neurologic: Strength is intact with plantar and dorsiflexion bilaterally.  2/5 strength with flexion at the hip bilaterally, 3/5 strength with extension Musculoskeletal: Tender to palpation over the legs  Lab Results: Recent Labs    09/30/20 0535 10/01/20 0509  WBC 8.4 9.3  HGB 9.6* 8.8*  HCT 28.6* 25.8*  PLT 133* 135*    BMET Recent Labs    09/29/20 0224  NA 140  K 3.8  CL 103  CO2 29  GLUCOSE 110*  BUN 33*  CREATININE 0.80  CALCIUM 8.7*    No results found for: CEA1  Studies/Results: DG Knee 1-2 Views Left  Result Date: 09/30/2020 CLINICAL DATA:  Worsening bilateral patellar pain and bilateral posterior knee pain over the past 3 months EXAM: LEFT KNEE - 1-2 VIEW; RIGHT KNEE - 1-2 VIEW COMPARISON:  None. FINDINGS: Mild tricompartmental degenerative changes are noted of both knees, greatest within the medial compartment bilaterally. There is no acute displaced fracture or dislocation involving either knee. There is no significant joint effusion. 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 No acute displaced fracture or dislocation. Electronically Signed   By: Constance Holster M.D.   On: 09/30/2020  19:29   DG Knee 1-2 Views Right  Result Date: 09/30/2020 CLINICAL DATA:  Worsening bilateral patellar pain and bilateral posterior knee pain over the past 3 months EXAM: LEFT KNEE - 1-2 VIEW; RIGHT KNEE - 1-2 VIEW COMPARISON:  None. FINDINGS: Mild tricompartmental degenerative changes are noted of both knees, greatest within the medial compartment bilaterally. There is no acute displaced fracture or dislocation involving either knee. There is no significant joint effusion. 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 No acute displaced fracture or dislocation. Electronically Signed   By: Constance Holster M.D.   On: 09/30/2020 19:29    Medications: I have reviewed the patient's current medications.  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 Biopsy 08/30/2019 confirmed well-differentiated squamous cell carcinoma with positive P 16 and p63 stains ? CTs 09/14/2019-abnormal soft tissue fullness at the lower anus/perianal soft tissues, asymmetric left inguinal lymphadenopathy, borderline enlarged left external iliac node, mild stranding and cutaneous thickening of the left gluteal fold, 2 to 3 mm pulmonary nodules, 1.6 cm right hepatic lesion-likely a hemangioma ? PET scan 38/12/173-ZWCHEN hypermetabolic anal mass. 3 hypermetabolic left inguinal lymph nodes. Left external iliac node with minimally higher than blood pool activity. 1.0 x 0.8 cm left thyroid nodule with activity mildly above background blood pool activity but below liver activity. No perceptible hypermetabolic activity in the vicinity of the lateral right hepatic lobe lesion seen on the 09/14/2019 CT. ? Began concurrent chemoRT with 5FU/mitomycin on 10/03/2019 ? Cycle25-FU, dose reduced, mitomycin held 11/07/2019 ? Radiation completed 11/24/2019 ? CT abdomen/pelvis 12/27/2019-extensive left-sided colonic  diverticulosis without  diverticulitis. No bowel obstruction. No abscess in the abdomen or pelvis. Rectum borderline distended with stool without perirectal wall thickening or soft tissue stranding. Left inguinal and external iliac lymph nodes now subcentimeter compared to the previous size. Stable small lesion in the right lobe of the liver. No new liver lesions evident. 2. COPD 3. Fibromyalgia 4. Chronic back pain 5. Hypothyroid 6. Depression 7. History of uterine cancer-age 37 8. Pain secondary to #1 9. Orthostatic hypotension, fatigue, dyspnea on exertion, early mucositis requiring supportive care on day 9, secondary to chemotherapy  10. Thrombocytopenia PLT 58K and neutropenia ANC 1.4 on day 9, secondary to chemotherapy 11. Worsening cytopenias, PLT 8K and ANC 0.0 on day 15, secondary to chemotherapy. Started prophylaxis with cipro on 10/22 day 11 12. Hospital admission 10/17/2019 -fatigue, pancytopenia, hypokalemia 13.Left thyroid nodule with activity mildly above background blood pool activity butbelowliver activity on PET scan 09/27/2019.Thyroid ultrasound219 2021-1.6 cm nodule left inferior thyroid gland. 12/25/4313 FNA-benign follicular nodule. 14.Tiny lung nodules noted on chest CT 09/14/2019 15. Falls, back/leg pain and leg weakness-CT lumbar spine 05/26/2020 with no acute abnormality within the lumbar spine; multilevel degenerative spondylosis with resultant mild canal with bilateral lateral recess stenosis at L4-5; moderate bilateral L4 and L5 foraminal stenosis related to disc bulge, reactive endplate changes and facet degeneration; soft tissue density/stranding involving the presacral space anterior to the sacrum, incompletely visualized  MRI thoracic and lumbar spine 06/14/2020-no mass or fracture, degenerative changes in the thoracic and lumbar spine  MRI brain 06/21/2020-no acute abnormality  MRI cervical, thoracic and lumbar spine 06/21/2020-no abnormal cervical cord signal, mild cervical  degenerative changes without high-grade stenosis, no significant abnormal enhancement in the thoracolumbar spine.  MRI pelvis 06/21/2020-no clearly defined mass or asymmetry in the anal/perianal region or adjacent rectum, mild but new presacral edemawithin the iliacus muscles, piriformis muscles, central gluteus muscles, and bilateral hip adductor musculature in a symmetric fashion. This is also accompanied by low-grade enhancement. This may reflect myositis, and association with prior regional radiation therapy is not excluded.  Trial of prednisone 60 mg daily 06/22/2020, tapered by radiation oncology, dose increased to 40 mg daily 08/06/2020, tapered to 30 mg daily beginning 09/13/2020 16.  Left-sided pulmonary embolism on CT 09/27/2020-Heparin initiated 17.  Admission 09/27/2020 with progressive leg weakness  Ms. Hodsdon appears unchanged. She has a history of anal cancer and is in clinical remission. Multiple imaging studies have not revealed evidence of recurrent anal cancer.  She has developed progressive leg weakness over the past 6 months.  The etiology remains clear.  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  She has undergone an extensive diagnostic evaluation including MRIs, neurosurgery, and neurology evaluations.  She has not undergone a lumbar puncture to rule out carcinomatous meningitis. The leg weakness has progressed and she now has increased leg pain and tenderness.  I agree with Dr. Maylene Roes that an inflammatory condition should be considered.  The CPK has not been elevated. She is now on a trial of high-dose steroids.   Recommendations: 1. Recommend repeat neurology evaluation as etiology of the leg weakness remains unclear 2. Continue steroids and look for improvement in neuromuscular symptoms 3. Continue Xarelto for the pulmonary embolism    LOS: 4 days   Mikey Bussing, NP   10/01/2020, 10:34 AM

## 2020-10-01 NOTE — Care Management Important Message (Signed)
Important Message  Patient Details IM Letter given to the Patient Name: Joanne Klein MRN: [id number] Date of Birth: 1946/07/24   Medicare Important Message Given:  Yes     [name] 10/01/2020, 11:29 AM

## 2020-10-01 NOTE — Discharge Instructions (Signed)
Information on my medicine - XARELTO (rivaroxaban)  This medication education was reviewed with me or my healthcare representative as part of my discharge preparation.  The pharmacist that spoke with me during my hospital stay was:  Penni Homans, [place]? Xarelto was prescribed to treat blood clots that may have been found in the veins of your legs (deep vein thrombosis) or in your lungs (pulmonary embolism) and to reduce the risk of them occurring again.  What do you need to know about Xarelto? The starting dose is one 15 mg tablet taken TWICE daily with food for the FIRST 21 DAYS then on SATURDAY, 10/20/20 the dose is changed to one 20 mg tablet taken ONCE A DAY with your evening meal.  DO NOT stop taking Xarelto without talking to the health care provider who prescribed the medication.  Refill your prescription for 20 mg tablets before you run out.  After discharge, you should have regular check-up appointments with your healthcare provider that is prescribing your Xarelto.  In the future your dose may need to be changed if your kidney function changes by a significant amount.  What do you do if you miss a dose? If you are taking Xarelto TWICE DAILY and you miss a dose, take it as soon as you remember. You may take two 15 mg tablets (total 30 mg) at the same time then resume your regularly scheduled 15 mg twice daily the next day.  If you are taking Xarelto ONCE DAILY and you miss a dose, take it as soon as you remember on the same day then continue your regularly scheduled once daily regimen the next day. Do not take two doses of Xarelto at the same time.   Important Safety Information Xarelto is a blood thinner medicine that can cause bleeding. You should call your healthcare provider right away if you experience any of the following: ? Bleeding from an injury or your nose that does not stop. ? Unusual colored urine (red or dark  brown) or unusual colored stools (red or black). ? Unusual bruising for unknown reasons. ? A serious fall or if you hit your head (even if there is no bleeding).  Some medicines may interact with Xarelto and might increase your risk of bleeding while on Xarelto. To help avoid this, consult your healthcare provider or pharmacist prior to using any new prescription or non-prescription medications, including herbals, vitamins, non-steroidal anti-inflammatory drugs (NSAIDs) and supplements.  This website has more information on Xarelto: [email or web address]

## 2020-10-01 NOTE — Progress Notes (Signed)
PROGRESS NOTE    Joanne Klein  LXB:[id number] DOB: Feb 07, 1946 DOA: 09/27/2020 PCP: Hoyt Koch, MD     Brief Narrative:  Joanne Klein is a 74 y.o. female with medical history significant of rectal cancer status post chemotherapy and radiation therapy who developed significant myopathy following treatment, and has significant lower extremity weakness, neuralgia, known degenerative disc disease of the lumbar spine, GERD, hypertension, fibromyalgia, overall weakness with poor mobility, morbid obesity as well as obstructive sleep apnea, hypertension, hypothyroidism, who presented to the hospital with worsening weakness as well as another fall.  She was evaluated and was being admitted with failure to thrive at home due to weakness with plan for possible PT OT and placement.  While being evaluated, patient was noted to be hypoxic and also tachycardic. Work-up with CT angiogram of the chest showed pulmonary embolism.  Patient has been placed on oxygen and is being treated and admitted for pulmonary embolism in addition to her chronic problems.   New events last 24 hours / Subjective: Pain appears to be much better controlled this morning as compared to the past 2 days.  The main change over last 24 hours has been increased dose of steroids.   Assessment & Plan:   Principal Problem:   Pulmonary embolism (HCC) Active Problems:   Depression   Hypothyroidism   GERD (gastroesophageal reflux disease)   COPD GOLD II    Hyperlipidemia   Morbid obesity ([place])   Anal cancer (HCC)   Chronic respiratory failure with hypoxia (HCC)   Weakness of both legs   Falls frequently   PE and DVT -Provoked in setting of immobility, need at least 3 mo anticoagulation treatment -Venous Doppler ultrasound revealed indeterminant DVT of the right lower extremity -Echo without heart strain -Xarelto   Acute hypoxemic respiratory failure -Secondary to above -Continue nasal cannula O2 and wean as  able  Lower extremity weakness -Thought to be secondary to myositis -EDP discussed with [name] with neurology who did not feel that MRI would provide more information.  It was felt that her condition is likely just worsened from being deconditioned. They had no other recommendations to offer  -PT OT consult, rec SNF  -Due to her continued musculoskeletal pain as well as joint pain, I wonder if she would benefit from rheumatology evaluation.  Unfortunately, rheumatology does not consult in the hospital and patient will need an outpatient referral. She initially had some improvement on 60 mg of prednisone over the summer, this has been slowly weaned to 30 mg daily.  I am going to increase it back to 60 mg Solu-Medrol to see if this will help. -Would also recommend outpatient EMG (unable to do this test as inpatient) -Bilateral knee xrays unremarkable -CRP and sed rate mildly elevated  -ANA, aldolase, RF, CCP pending  -Continue pain control, IV steroids, PT OT  -Add lyrica for any neuropathic component. Also schedule oxycodone as patient has had trouble getting medication on time prn   History of rectal cancer -Follows with Dr. Benay Spice  Hypothyroidism -Continue Synthroid  GERD -Continue Protonix  Depression -Continue Zoloft  Hyperlipidemia -Continue Lipitor    DVT prophylaxis: Rivaroxaban (XARELTO) tablet 15 mg  rivaroxaban (XARELTO) tablet 20 mg  Code Status: Full code Family Communication: No family at bedside, spoke with niece today for update  Disposition Plan:  Status is: Inpatient  Remains inpatient appropriate because:Ongoing active pain requiring inpatient pain management, Unsafe d/c plan and Inpatient level of care appropriate due to severity  of illness   Dispo: The patient is from: Home              Anticipated d/c is to: SNF              Anticipated d/c date is: 1 day              Patient currently is not medically stable to d/c.  Continue IV Solu-Medrol.   SNF placement is pending    Consultants:   None  Procedures:   None  Antimicrobials:  Anti-infectives (From admission, onward)   None       Objective: Vitals:   09/30/20 0625 09/30/20 1227 09/30/20 2124 10/01/20 0535  BP: 124/73 126/64 129/68 119/61  Pulse: 99 95 91 (!) 102  Resp: 16 20 16 16   Temp: 98.2 F (36.8 C) 98.9 F (37.2 C) 99.2 F (37.3 C) 99.5 F (37.5 C)  TempSrc: Oral Oral Oral Oral  SpO2: 98% 96% 100% 96%  Weight:      Height:        Intake/Output Summary (Last 24 hours) at 10/01/2020 1028 Last data filed at 10/01/2020 1002 Gross per 24 hour  Intake 600 ml  Output 450 ml  Net 150 ml   Filed Weights   09/28/20 0848  Weight: 99.8 kg    Examination: General exam: Appears calm and comfortable  Respiratory system: Clear to auscultation. Respiratory effort normal. Cardiovascular system: S1 & S2 heard, RRR. No pedal edema. Gastrointestinal system: Abdomen is nondistended, soft and nontender. Normal bowel sounds heard. Central nervous system: Alert and oriented.  Speech clear Psychiatry: Judgement and insight appear stable. Mood & affect appropriate.    Data Reviewed: I have personally reviewed following labs and imaging studies  CBC: Recent Labs  Lab 09/27/20 1738 09/28/20 0509 09/29/20 0224 09/30/20 0535 10/01/20 0509  WBC 8.0 7.2 8.1 8.4 9.3  NEUTROABS 7.2  --   --   --   --   HGB 13.3 12.2 11.2* 9.6* 8.8*  HCT 38.4 35.0* 32.2* 28.6* 25.8*  MCV 96.0 96.4 97.3 100.0 98.1  PLT 127* 119* 123* 133* 188*   Basic Metabolic Panel: Recent Labs  Lab 09/27/20 1738 09/28/20 0509 09/29/20 0224  NA 141 141 140  K 3.7 3.3* 3.8  CL 102 103 103  CO2 29 29 29   GLUCOSE 145* 98 110*  BUN 28* 28* 33*  CREATININE 0.95 0.80 0.80  CALCIUM 9.2 8.8* 8.7*  MG  --   --  2.2   GFR: Estimated Creatinine Clearance: 75.8 mL/min (by C-G formula based on SCr of 0.8 mg/dL). Liver Function Tests: Recent Labs  Lab 09/28/20 0509  AST 14*  ALT 15   ALKPHOS 60  BILITOT 1.5*  PROT 5.9*  ALBUMIN 3.5   No results for input(s): LIPASE, AMYLASE in the last 168 hours. No results for input(s): AMMONIA in the last 168 hours. Coagulation Profile: Recent Labs  Lab 09/27/20 2327  INR 1.0   Cardiac Enzymes: Recent Labs  Lab 09/27/20 1738  CKTOTAL 34*   BNP (last 3 results) No results for input(s): PROBNP in the last 8760 hours. HbA1C: No results for input(s): HGBA1C in the last 72 hours. CBG: No results for input(s): GLUCAP in the last 168 hours. Lipid Profile: No results for input(s): CHOL, HDL, LDLCALC, TRIG, CHOLHDL, LDLDIRECT in the last 72 hours. Thyroid Function Tests: No results for input(s): TSH, T4TOTAL, FREET4, T3FREE, THYROIDAB in the last 72 hours. Anemia Panel: No results for input(s): VITAMINB12, FOLATE,  FERRITIN, TIBC, IRON, RETICCTPCT in the last 72 hours. Sepsis Labs: No results for input(s): PROCALCITON, LATICACIDVEN in the last 168 hours.  Recent Results (from the past 240 hour(s))  Respiratory Panel by RT PCR (Flu A&B, Covid) - Nasopharyngeal Swab     Status: None   Collection Time: 09/27/20  8:33 PM   Specimen: Nasopharyngeal Swab  Result Value Ref Range Status   SARS Coronavirus 2 by RT PCR NEGATIVE NEGATIVE Final    Comment: (NOTE) SARS-CoV-2 target nucleic acids are NOT DETECTED.  The SARS-CoV-2 RNA is generally detectable in upper respiratoy specimens during the acute phase of infection. The lowest concentration of SARS-CoV-2 viral copies this assay can detect is 131 copies/mL. A negative result does not preclude SARS-Cov-2 infection and should not be used as the sole basis for treatment or other patient management decisions. A negative result may occur with  improper specimen collection/handling, submission of specimen other than nasopharyngeal swab, presence of viral mutation(s) within the areas targeted by this assay, and inadequate number of viral copies (<131 copies/mL). A negative result  must be combined with clinical observations, patient history, and epidemiological information. The expected result is Negative.  Fact Sheet for Patients:  PinkCheek.be  Fact Sheet for Healthcare Providers:  GravelBags.it  This test is no t yet approved or cleared by the [place] FDA and  has been authorized for detection and/or diagnosis of SARS-CoV-2 by FDA under an Emergency Use Authorization (EUA). This EUA will remain  in effect (meaning this test can be used) for the duration of the COVID-19 declaration under Section 564(b)(1) of the Act, 21 U.S.C. section 360bbb-3(b)(1), unless the authorization is terminated or revoked sooner.     Influenza A by PCR NEGATIVE NEGATIVE Final   Influenza B by PCR NEGATIVE NEGATIVE Final    Comment: (NOTE) The Xpert Xpress SARS-CoV-2/FLU/RSV assay is intended as an aid in  the diagnosis of influenza from Nasopharyngeal swab specimens and  should not be used as a sole basis for treatment. Nasal washings and  aspirates are unacceptable for Xpert Xpress SARS-CoV-2/FLU/RSV  testing.  Fact Sheet for Patients: PinkCheek.be  Fact Sheet for Healthcare Providers: GravelBags.it  This test is not yet approved or cleared by the [place] FDA and  has been authorized for detection and/or diagnosis of SARS-CoV-2 by  FDA under an Emergency Use Authorization (EUA). This EUA will remain  in effect (meaning this test can be used) for the duration of the  Covid-19 declaration under Section 564(b)(1) of the Act, 21  U.S.C. section 360bbb-3(b)(1), unless the authorization is  terminated or revoked. Performed at [facility], [place] [address] [place], [place] [address]       Radiology Studies: DG Knee 1-2 Views Left  Result Date: 09/30/2020 CLINICAL DATA:  Worsening bilateral patellar pain and bilateral posterior  knee pain over the past 3 months EXAM: LEFT KNEE - 1-2 VIEW; RIGHT KNEE - 1-2 VIEW COMPARISON:  None. FINDINGS: Mild tricompartmental degenerative changes are noted of both knees, greatest within the medial compartment bilaterally. There is no acute displaced fracture or dislocation involving either knee. There is no significant joint effusion. 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 No acute displaced fracture or dislocation. Electronically Signed   By: Constance Holster M.D.   On: 09/30/2020 19:29   DG Knee 1-2 Views Right  Result Date: 09/30/2020 CLINICAL DATA:  Worsening bilateral patellar pain and bilateral posterior knee pain over the past  3 months EXAM: LEFT KNEE - 1-2 VIEW; RIGHT KNEE - 1-2 VIEW COMPARISON:  None. FINDINGS: Mild tricompartmental degenerative changes are noted of both knees, greatest within the medial compartment bilaterally. There is no acute displaced fracture or dislocation involving either knee. There is no significant joint effusion. 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 No acute displaced fracture or dislocation. Electronically Signed   By: Constance Holster M.D.   On: 09/30/2020 19:29      Scheduled Meds: . atorvastatin  20 mg Oral Once per day on Sun Tue Thu Sat  . cholecalciferol  1,000 Units Oral Daily  . ketorolac  15 mg Intravenous Q8H  . levothyroxine  75 mcg Oral QAC breakfast  . methylPREDNISolone (SOLU-MEDROL) injection  62.5 mg Intravenous Daily  . oxyCODONE  10 mg Oral Q4H  . oxyCODONE  10 mg Oral Q12H  . pantoprazole  40 mg Oral BID AC  . polyethylene glycol  17 g Oral QHS  . pregabalin  75 mg Oral BID  . rivaroxaban  15 mg Oral BID WC   Followed by  . [START ON 10/20/2020] rivaroxaban  20 mg Oral Q supper  . sertraline  200 mg Oral QPM   Continuous Infusions:    LOS: 4 days       Time spent: 40 minutes   Dessa Phi, DO [facility] 10/01/2020, 10:28 AM   Available via Epic secure chat 7am-7pm After these hours, please refer to coverage provider listed on [email or web address]

## 2020-10-01 NOTE — Plan of Care (Signed)
  Problem: Clinical Measurements: Goal: Ability to maintain clinical measurements within normal limits will improve Outcome: Progressing   Problem: Skin Integrity: Goal: Demonstration of wound healing without infection will improve Outcome: Progressing   Problem: Education: Goal: Knowledge of General Education information will improve Description: Including pain rating scale, medication(s)/side effects and non-pharmacologic comfort measures Outcome: Progressing   Problem: Clinical Measurements: Goal: Ability to maintain clinical measurements within normal limits will improve Outcome: Progressing Goal: Will remain free from infection Outcome: Progressing Goal: Diagnostic test results will improve Outcome: Progressing Goal: Respiratory complications will improve Outcome: Progressing   Problem: Activity: Goal: Risk for activity intolerance will decrease Outcome: Progressing   Problem: Nutrition: Goal: Adequate nutrition will be maintained Outcome: Progressing   Problem: Coping: Goal: Level of anxiety will decrease Outcome: Progressing   Problem: Elimination: Goal: Will not experience complications related to bowel motility Outcome: Progressing   Problem: Pain Managment: Goal: General experience of comfort will improve Outcome: Progressing   Problem: Safety: Goal: Ability to remain free from injury will improve Outcome: Progressing   Problem: Skin Integrity: Goal: Risk for impaired skin integrity will decrease Outcome: Progressing

## 2020-10-02 DIAGNOSIS — I2699 Other pulmonary embolism without acute cor pulmonale: Secondary | ICD-10-CM | POA: Diagnosis not present

## 2020-10-02 LAB — ALDOLASE: Aldolase: 4.8 U/L (ref 3.3–10.3)

## 2020-10-02 LAB — CBC
HCT: 23.5 % — ABNORMAL LOW (ref 36.0–46.0)
Hemoglobin: 8 g/dL — ABNORMAL LOW (ref 12.0–15.0)
MCH: 33.6 pg (ref 26.0–34.0)
MCHC: 34 g/dL (ref 30.0–36.0)
MCV: 98.7 fL (ref 80.0–100.0)
Platelets: 125 10*3/uL — ABNORMAL LOW (ref 150–400)
RBC: 2.38 MIL/uL — ABNORMAL LOW (ref 3.87–5.11)
RDW: 13.6 % (ref 11.5–15.5)
WBC: 7.8 10*3/uL (ref 4.0–10.5)
nRBC: 0 % (ref 0.0–0.2)

## 2020-10-02 NOTE — Progress Notes (Signed)
TOC CM/CSW received a call from [place] at [employer] regarding pts authorization.  Pt had been approved for SNF placement, but no SNF on file.  [facility] cancelled authorization, due to it being only good through the end of the day today (10/02/2020).    Therefore, a new authorization will have to be started when pt is medically stable to go to SNF.  CSW will continue to follow for dc needs.  Alysha Doolan Tarpley-Carter, MSW, LCSW-A Pronouns:  She, Her, Hers                  [place] ED Transitions of CareClinical Social Worker [email or web address] [phone]

## 2020-10-02 NOTE — TOC Progression Note (Signed)
Transition of Care [facility]) - Progression Note    Patient Details  Name: YVONNA BRUN MRN: [id number] Date of Birth: 01-Apr-1946  Transition of Care [facility]) CM/SW Contact  Purcell Mouton, RN Phone Number: 10/02/2020, 3:12 PM  Clinical Narrative:     Spoke with pt who selected [facility] and asked that her daughter Jonni Sanger [phone] be called to confirm. Jonni Sanger confirmed [employer].  Expected Discharge Plan: [place] Barriers to Discharge: Continued Medical Work up  Expected Discharge Plan and Services Expected Discharge Plan: [place] In-house Referral: Clinical Social Work Discharge Planning Services: CM Consult Post Acute Care Choice: [place] Living arrangements for the past 2 months: Single Family Home                                       Social Determinants of Health (SDOH) Interventions    Readmission Risk Interventions Readmission Risk Prevention Plan 06/22/2020  Transportation Screening Complete  PCP or Specialist Appt within 5-7 Days Not Complete  Not Complete comments disposition pending  Home Care Screening Complete  Medication Review (RN CM) Referral to Pharmacy  Some recent data might be hidden

## 2020-10-02 NOTE — Progress Notes (Signed)
PROGRESS NOTE    Joanne Klein  BUL:[id number] DOB: 1946-01-29 DOA: 09/27/2020 PCP: Hoyt Koch, MD     Brief Narrative:  Joanne Klein is a 74 y.o. female with medical history significant of rectal cancer status post chemotherapy and radiation therapy who developed significant myopathy following treatment, and has significant lower extremity weakness, neuralgia, known degenerative disc disease of the lumbar spine, GERD, hypertension, fibromyalgia, overall weakness with poor mobility, morbid obesity as well as obstructive sleep apnea, hypertension, hypothyroidism, who presented to the hospital with worsening weakness as well as another fall.  She was evaluated and was being admitted with failure to thrive at home due to weakness with plan for possible PT OT and placement.  While being evaluated, patient was noted to be hypoxic and also tachycardic. Work-up with CT angiogram of the chest showed pulmonary embolism.  Patient has been placed on oxygen and is being treated and admitted for pulmonary embolism in addition to her chronic problems. Hospitalization further complicated by severe bilateral knee pain, continued weakness. She is recommended to go to SNF on discharge.    New events last 24 hours / Subjective: Resting comfortably. Knee pain has been better controlled on IV steroids as well as scheduled oxycodone, lyrica. Discussed with patient importance of continued PT. She denies any blood loss, she has not had a BM yesterday. BM day before yesterday was normal without blood. No other blood loss reported by patient.   Assessment & Plan:   Principal Problem:   Pulmonary embolism (HCC) Active Problems:   Depression   Hypothyroidism   GERD (gastroesophageal reflux disease)   COPD GOLD II    Hyperlipidemia   Morbid obesity ([place])   Anal cancer (HCC)   Chronic respiratory failure with hypoxia (HCC)   Weakness of both legs   Falls frequently   PE and DVT -Provoked in setting  of immobility, need at least 3 mo anticoagulation treatment -Venous Doppler ultrasound revealed indeterminant DVT of the right lower extremity -Echo without heart strain -Xarelto  -Continue to monitor Hgb. Has been slowly trending downward but patient denies any overt blood loss   Acute hypoxemic respiratory failure -Secondary to above -Continue nasal cannula O2 and wean as able  Lower extremity weakness -Thought to be secondary to myositis -EDP discussed with [name] with neurology who did not feel that MRI would provide more information.  It was felt that her condition is likely just worsened from being deconditioned. They had no other recommendations to offer  -PT OT consult, rec SNF  -Due to her continued musculoskeletal pain as well as joint pain, I wonder if she would benefit from rheumatology evaluation.  Unfortunately, rheumatology does not consult in the hospital and patient will need an outpatient referral. She initially had some improvement on 60 mg of prednisone over the summer, this has been slowly weaned to 30 mg daily.  Now back on 60 mg Solu-Medrol to see if this will help. -Would also recommend outpatient EMG (unable to do this test as inpatient) -Bilateral knee xrays unremarkable -CRP and sed rate mildly elevated  -ANA, RF, CCP negative. Aldolase pending  -Continue pain control, IV steroids, PT OT  -Added lyrica for any neuropathic component. Also scheduled oxycodone as patient has had trouble getting medication on time prn   History of rectal cancer -Follows with Dr. Benay Spice  Hypothyroidism -Continue Synthroid  GERD -Continue Protonix  Depression -Continue Zoloft  Hyperlipidemia -Continue Lipitor    DVT prophylaxis: Rivaroxaban (XARELTO) tablet 15  mg  rivaroxaban (XARELTO) tablet 20 mg  Code Status: Full code Family Communication: No family at bedside, spoke with niece 10/11   Disposition Plan:  Status is: Inpatient  Remains inpatient appropriate  because:Ongoing active pain requiring inpatient pain management, Unsafe d/c plan and Inpatient level of care appropriate due to severity of illness   Dispo: The patient is from: Home              Anticipated d/c is to: SNF              Anticipated d/c date is: 2 days              Patient currently is not medically stable to d/c.  Continue IV Solu-Medrol.  SNF placement is pending    Consultants:   None  Procedures:   None  Antimicrobials:  Anti-infectives (From admission, onward)   None       Objective: Vitals:   10/01/20 0535 10/01/20 1332 10/01/20 2224 10/02/20 0514  BP: 119/61 111/68 127/72 125/71  Pulse: (!) 102 98 92 89  Resp: 16 18 20 20   Temp: 99.5 F (37.5 C) 99.7 F (37.6 C) 98.6 F (37 C) 97.6 F (36.4 C)  TempSrc: Oral Oral Oral Oral  SpO2: 96% 95% 93% 96%  Weight:      Height:        Intake/Output Summary (Last 24 hours) at 10/02/2020 0850 Last data filed at 10/02/2020 0500 Gross per 24 hour  Intake 240 ml  Output 600 ml  Net -360 ml   Filed Weights   09/28/20 0848  Weight: 99.8 kg    Examination: General exam: Appears calm and comfortable  Respiratory system: Clear to auscultation. Respiratory effort normal. Cardiovascular system: S1 & S2 heard, RRR. No pedal edema. Gastrointestinal system: Abdomen is nondistended, soft and nontender. Normal bowel sounds heard. Central nervous system: Alert and oriented. Non focal exam. Speech clear  Extremities: Symmetric in appearance bilaterally  Skin: No rashes, lesions or ulcers on exposed skin  Psychiatry: Judgement and insight appear stable. Mood & affect appropriate.    Data Reviewed: I have personally reviewed following labs and imaging studies  CBC: Recent Labs  Lab 09/27/20 1738 09/27/20 1738 09/28/20 0509 09/29/20 0224 09/30/20 0535 10/01/20 0509 10/02/20 0510  WBC 8.0   < > 7.2 8.1 8.4 9.3 7.8  NEUTROABS 7.2  --   --   --   --   --   --   HGB 13.3   < > 12.2 11.2* 9.6* 8.8* 8.0*   HCT 38.4   < > 35.0* 32.2* 28.6* 25.8* 23.5*  MCV 96.0   < > 96.4 97.3 100.0 98.1 98.7  PLT 127*   < > 119* 123* 133* 135* 125*   < > = values in this interval not displayed.   Basic Metabolic Panel: Recent Labs  Lab 09/27/20 1738 09/28/20 0509 09/29/20 0224  NA 141 141 140  K 3.7 3.3* 3.8  CL 102 103 103  CO2 29 29 29   GLUCOSE 145* 98 110*  BUN 28* 28* 33*  CREATININE 0.95 0.80 0.80  CALCIUM 9.2 8.8* 8.7*  MG  --   --  2.2   GFR: Estimated Creatinine Clearance: 75.8 mL/min (by C-G formula based on SCr of 0.8 mg/dL). Liver Function Tests: Recent Labs  Lab 09/28/20 0509  AST 14*  ALT 15  ALKPHOS 60  BILITOT 1.5*  PROT 5.9*  ALBUMIN 3.5   No results for input(s): LIPASE, AMYLASE in  the last 168 hours. No results for input(s): AMMONIA in the last 168 hours. Coagulation Profile: Recent Labs  Lab 09/27/20 2327  INR 1.0   Cardiac Enzymes: Recent Labs  Lab 09/27/20 1738  CKTOTAL 34*   BNP (last 3 results) No results for input(s): PROBNP in the last 8760 hours. HbA1C: No results for input(s): HGBA1C in the last 72 hours. CBG: No results for input(s): GLUCAP in the last 168 hours. Lipid Profile: No results for input(s): CHOL, HDL, LDLCALC, TRIG, CHOLHDL, LDLDIRECT in the last 72 hours. Thyroid Function Tests: No results for input(s): TSH, T4TOTAL, FREET4, T3FREE, THYROIDAB in the last 72 hours. Anemia Panel: No results for input(s): VITAMINB12, FOLATE, FERRITIN, TIBC, IRON, RETICCTPCT in the last 72 hours. Sepsis Labs: No results for input(s): PROCALCITON, LATICACIDVEN in the last 168 hours.  Recent Results (from the past 240 hour(s))  Respiratory Panel by RT PCR (Flu A&B, Covid) - Nasopharyngeal Swab     Status: None   Collection Time: 09/27/20  8:33 PM   Specimen: Nasopharyngeal Swab  Result Value Ref Range Status   SARS Coronavirus 2 by RT PCR NEGATIVE NEGATIVE Final    Comment: (NOTE) SARS-CoV-2 target nucleic acids are NOT DETECTED.  The  SARS-CoV-2 RNA is generally detectable in upper respiratoy specimens during the acute phase of infection. The lowest concentration of SARS-CoV-2 viral copies this assay can detect is 131 copies/mL. A negative result does not preclude SARS-Cov-2 infection and should not be used as the sole basis for treatment or other patient management decisions. A negative result may occur with  improper specimen collection/handling, submission of specimen other than nasopharyngeal swab, presence of viral mutation(s) within the areas targeted by this assay, and inadequate number of viral copies (<131 copies/mL). A negative result must be combined with clinical observations, patient history, and epidemiological information. The expected result is Negative.  Fact Sheet for Patients:  PinkCheek.be  Fact Sheet for Healthcare Providers:  GravelBags.it  This test is no t yet approved or cleared by the [place] FDA and  has been authorized for detection and/or diagnosis of SARS-CoV-2 by FDA under an Emergency Use Authorization (EUA). This EUA will remain  in effect (meaning this test can be used) for the duration of the COVID-19 declaration under Section 564(b)(1) of the Act, 21 U.S.C. section 360bbb-3(b)(1), unless the authorization is terminated or revoked sooner.     Influenza A by PCR NEGATIVE NEGATIVE Final   Influenza B by PCR NEGATIVE NEGATIVE Final    Comment: (NOTE) The Xpert Xpress SARS-CoV-2/FLU/RSV assay is intended as an aid in  the diagnosis of influenza from Nasopharyngeal swab specimens and  should not be used as a sole basis for treatment. Nasal washings and  aspirates are unacceptable for Xpert Xpress SARS-CoV-2/FLU/RSV  testing.  Fact Sheet for Patients: PinkCheek.be  Fact Sheet for Healthcare Providers: GravelBags.it  This test is not yet approved or cleared  by the [place] FDA and  has been authorized for detection and/or diagnosis of SARS-CoV-2 by  FDA under an Emergency Use Authorization (EUA). This EUA will remain  in effect (meaning this test can be used) for the duration of the  Covid-19 declaration under Section 564(b)(1) of the Act, 21  U.S.C. section 360bbb-3(b)(1), unless the authorization is  terminated or revoked. Performed at [facility], [place] [address] [place], [place] [address]       Radiology Studies: DG Knee 1-2 Views Left  Result Date: 09/30/2020 CLINICAL DATA:  Worsening bilateral patellar  pain and bilateral posterior knee pain over the past 3 months EXAM: LEFT KNEE - 1-2 VIEW; RIGHT KNEE - 1-2 VIEW COMPARISON:  None. FINDINGS: Mild tricompartmental degenerative changes are noted of both knees, greatest within the medial compartment bilaterally. There is no acute displaced fracture or dislocation involving either knee. There is no significant joint effusion. 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 No acute displaced fracture or dislocation. Electronically Signed   By: Constance Holster M.D.   On: 09/30/2020 19:29   DG Knee 1-2 Views Right  Result Date: 09/30/2020 CLINICAL DATA:  Worsening bilateral patellar pain and bilateral posterior knee pain over the past 3 months EXAM: LEFT KNEE - 1-2 VIEW; RIGHT KNEE - 1-2 VIEW COMPARISON:  None. FINDINGS: Mild tricompartmental degenerative changes are noted of both knees, greatest within the medial compartment bilaterally. There is no acute displaced fracture or dislocation involving either knee. There is no significant joint effusion. 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 No acute displaced fracture or dislocation. Electronically Signed   By: Constance Holster M.D.   On: 09/30/2020 19:29       Scheduled Meds:  atorvastatin  20 mg Oral Once per day on Sun Tue Thu Sat   cholecalciferol  1,000 Units Oral Daily   levothyroxine  75 mcg Oral QAC breakfast   methylPREDNISolone (SOLU-MEDROL) injection  62.5 mg Intravenous Daily   oxyCODONE  10 mg Oral Q4H   oxyCODONE  10 mg Oral Q12H   pantoprazole  40 mg Oral BID AC   polyethylene glycol  17 g Oral QHS   pregabalin  75 mg Oral BID   rivaroxaban  15 mg Oral BID WC   Followed by   Derrill Memo ON 10/20/2020] rivaroxaban  20 mg Oral Q supper   sertraline  200 mg Oral QPM   Continuous Infusions:    LOS: 5 days      Time spent: 25 minutes   Dessa Phi, DO [facility] 10/02/2020, 8:50 AM   Available via Epic secure chat 7am-7pm After these hours, please refer to coverage provider listed on [email or web address]

## 2020-10-02 NOTE — TOC Progression Note (Signed)
Transition of Care [facility]) - Progression Note    Patient Details  Name: Joanne Klein MRN: [id number] Date of Birth: 1946-01-08  [facility]) CM/SW Contact  Purcell Mouton, RN Phone Number: 10/02/2020, 12:57 PM  Clinical Narrative:     SNF bed offers given to pt.   Expected Discharge Plan: [place] Barriers to Discharge: Continued Medical Work up  Expected Discharge Plan and Services Expected Discharge Plan: [place] In-house Referral: Clinical Social Work Discharge Planning Services: CM Consult Post Acute Care Choice: [place] Living arrangements for the past 2 months: Single Family Home                                       Social Determinants of Health (SDOH) Interventions    Readmission Risk Interventions Readmission Risk Prevention Plan 06/22/2020  Transportation Screening Complete  PCP or Specialist Appt within 5-7 Days Not Complete  Not Complete comments disposition pending  Home Care Screening Complete  Medication Review (RN CM) Referral to Pharmacy  Some recent data might be hidden

## 2020-10-02 NOTE — Plan of Care (Signed)

## 2020-10-02 NOTE — Progress Notes (Signed)
Physical Therapy Treatment Patient Details Name: Joanne Klein MRN: [id number] DOB: 1946-02-02 Today's Date: 10/02/2020    History of Present Illness 74 y.o. female with medical history significant of rectal cancer status post chemotherapy and radiation therapy who developed significant myopathy following treatment, and has significant lower extremity weakness, neuralgia, known degenerative disc disease of the lumbar spine, GERD, hypertension, fibromyalgia, overall weakness with poor mobility, morbid obesity as well as obstructive sleep apnea, hypertension, hypothyroidism, who presented to the hospital with worsening weakness as well as another fall.  Pt admitted with failure to thrive at home due to weakness and found to have PE    PT Comments    Pt assisted to sitting EOB and attempted standing however pt too weak to perform full extension into upright position.  Continue to recommend SNF upon d/c.   Follow Up Recommendations  SNF     Equipment Recommendations  None recommended by PT    Recommendations for Other Services       Precautions / Restrictions Precautions Precautions: Fall Precaution Comments: very tender LEs    Mobility  Bed Mobility Overal bed mobility: Needs Assistance Bed Mobility: Supine to Sit;Sit to Supine     Supine to sit: +2 for physical assistance;Mod assist Sit to supine: +2 for physical assistance;Mod assist   General bed mobility comments: verbal cues for self assist pt able to assist more with upper body, assits for LEs  Transfers Overall transfer level: Needs assistance Equipment used: Rolling walker (2 wheeled) Transfers: Sit to/from Stand Sit to Stand: Total assist         General transfer comment: attempted x2 however pt only able to lift hips from bed however too weak to perform full extension (knees blocked for safety) pt fatigued quickly and requested return to supine  Ambulation/Gait                 Stairs              Wheelchair Mobility    Modified Rankin (Stroke Patients Only)       Balance Overall balance assessment: Needs assistance;History of Falls Sitting-balance support: Feet supported Sitting balance-Leahy Scale: Fair                                      Cognition Arousal/Alertness: Awake/alert Behavior During Therapy: WFL for tasks assessed/performed Overall Cognitive Status: Within Functional Limits for tasks assessed                                        Exercises General Exercises - Lower Extremity Long Arc Quad: AROM;Limitations;5 reps;Both;Seated Long Arc Quad Limitations: unable to perform full AROM Hip Flexion/Marching: AROM;Both;5 reps;Seated;Limitations Hip Flexion/Marching Limitations: unable to perform full AROM    General Comments        Pertinent Vitals/Pain Pain Assessment: 0-10 Pain Score: 5  Pain Location: bil LEs Pain Descriptors / Indicators: Tender;Sore Pain Intervention(s): Monitored during session;Repositioned    Home Living                      Prior Function            PT Goals (current goals can now be found in the care plan section) Progress towards PT goals: Progressing toward goals    Frequency  Min 2X/week      PT Plan Current plan remains appropriate    Co-evaluation              AM-PAC PT "6 Clicks" Mobility   Outcome Measure  Help needed turning from your back to your side while in a flat bed without using bedrails?: A Lot Help needed moving from lying on your back to sitting on the side of a flat bed without using bedrails?: A Lot Help needed moving to and from a bed to a chair (including a wheelchair)?: Total Help needed standing up from a chair using your arms (e.g., wheelchair or bedside chair)?: Total Help needed to walk in hospital room?: Total Help needed climbing 3-5 steps with a railing? : Total 6 Click Score: 8    End of Session Equipment Utilized During  Treatment: Gait belt Activity Tolerance: Patient tolerated treatment well Patient left: with call bell/phone within reach;in bed;with bed alarm set   PT Visit Diagnosis: Other abnormalities of gait and mobility (R26.89)     Time: 1323-1340 PT Time Calculation (min) (ACUTE ONLY): 17 min  Charges:  $Therapeutic Activity: 8-22 mins                     Jannette Spanner PT, DPT Acute Rehabilitation Services Pager: [phone] Office: [phone]  [place] Ram E 10/02/2020, 1:57 PM

## 2020-10-03 DIAGNOSIS — I2609 Other pulmonary embolism with acute cor pulmonale: Secondary | ICD-10-CM | POA: Diagnosis not present

## 2020-10-03 DIAGNOSIS — J9611 Chronic respiratory failure with hypoxia: Secondary | ICD-10-CM | POA: Diagnosis not present

## 2020-10-03 DIAGNOSIS — C210 Malignant neoplasm of anus, unspecified: Secondary | ICD-10-CM | POA: Diagnosis not present

## 2020-10-03 DIAGNOSIS — R627 Adult failure to thrive: Secondary | ICD-10-CM

## 2020-10-03 DIAGNOSIS — R296 Repeated falls: Secondary | ICD-10-CM | POA: Diagnosis not present

## 2020-10-03 LAB — CBC
HCT: 23.1 % — ABNORMAL LOW (ref 36.0–46.0)
Hemoglobin: 7.8 g/dL — ABNORMAL LOW (ref 12.0–15.0)
MCH: 34.1 pg — ABNORMAL HIGH (ref 26.0–34.0)
MCHC: 33.8 g/dL (ref 30.0–36.0)
MCV: 100.9 fL — ABNORMAL HIGH (ref 80.0–100.0)
Platelets: 142 10*3/uL — ABNORMAL LOW (ref 150–400)
RBC: 2.29 MIL/uL — ABNORMAL LOW (ref 3.87–5.11)
RDW: 13.5 % (ref 11.5–15.5)
WBC: 8.3 10*3/uL (ref 4.0–10.5)
nRBC: 0 % (ref 0.0–0.2)

## 2020-10-03 LAB — BASIC METABOLIC PANEL
Anion gap: 10 (ref 5–15)
BUN: 35 mg/dL — ABNORMAL HIGH (ref 8–23)
CO2: 28 mmol/L (ref 22–32)
Calcium: 8.8 mg/dL — ABNORMAL LOW (ref 8.9–10.3)
Chloride: 98 mmol/L (ref 98–111)
Creatinine, Ser: 0.78 mg/dL (ref 0.44–1.00)
GFR, Estimated: >60 mL/min (ref >60)
Glucose, Bld: 124 mg/dL — ABNORMAL HIGH (ref 70–99)
Potassium: 5.1 mmol/L (ref 3.5–5.1)
Sodium: 136 mmol/L (ref 135–145)

## 2020-10-03 MED ORDER — SENNOSIDES-DOCUSATE SODIUM 8.6-50 MG PO TABS
1.0000 | ORAL_TABLET | Freq: Two times a day (BID) | ORAL | Status: DC
  Administered 2020-10-03 – 2020-10-10 (×13): 1 via ORAL
  Filled 2020-10-03 (×13): qty 1

## 2020-10-03 NOTE — Consult Note (Signed)
   [facility] CM Inpatient Consult   10/03/2020  Joanne Klein Feb 03, 1946 [id number]  Patient screened for high risk score for unplanned readmission. Chart reviewed to assess for potential [place] Management community service needs. Per review, current disposition plan is for SNF. No [facility] Care Management follow up needs at this time.   Netta Cedars, MSN, [facility] Liaison Nurse Mobile Phone [phone]  Toll free office [phone]

## 2020-10-03 NOTE — Progress Notes (Signed)
PROGRESS NOTE    Joanne Klein  KGY:[id number] DOB: 1946/04/19 DOA: 09/27/2020 PCP: Hoyt Koch, MD    Chief Complaint  Patient presents with  . Fall    Brief Narrative:  Joanne Klein a 74 y.o.femalewith medical history significant ofrectal cancer status post chemotherapy and radiation therapy who developed significant myopathy following treatment, and has significant lower extremity weakness, neuralgia, known degenerative disc disease of the lumbar spine, GERD, hypertension, fibromyalgia, overall weakness with poor mobility, morbid obesity as well as obstructive sleep apnea, hypertension, hypothyroidism, who presented to the hospital with worsening weakness as well as another fall. She was evaluated and was being admitted with failure to thrive at home due to weakness with plan for possible PT OT and placement. While being evaluated, patient was noted to be hypoxic and also tachycardic. Work-up with CT angiogram of the chest showed pulmonary embolism. Patient has been placed on oxygen and is being treated and admitted for pulmonary embolism in addition to her chronic problems. Hospitalization further complicated by severe bilateral knee pain, continued weakness. She is recommended to go to SNF on discharge.   Subjective:  Not able to lift legs up against gravity, reports knee pain and back pain,  Denies bowel or bladder incontinence  She is on 2liter oxygen supplement, she denies sob  Assessment & Plan:   Principal Problem:   Pulmonary embolism (HCC) Active Problems:   Depression   Hypothyroidism   GERD (gastroesophageal reflux disease)   COPD GOLD II    Hyperlipidemia   Morbid obesity ([place])   Anal cancer ([place])   Chronic respiratory failure with hypoxia ([place])   Weakness of both legs   Falls frequently   PE and DVT -Provoked in setting of immobility, need at least 3 mo anticoagulation treatment -Venous Doppler ultrasound revealed indeterminant DVT of  the right lower extremity -Echo without heart strain -Xarelto  -Continue to monitor Hgb. Has been slowly trending downward but patient denies any overt blood loss   Acute hypoxemic respiratory failure -Secondary to above -Continue nasal cannula O2 and wean as able  Lower extremity weakness -She was hospitalized for the same in July, had MRI of spine showed myositis, CK unremarkable -per patient she improved with steroid treatment however weakness got worse when steroid dose decreased in the last few weeks --EDP discussed with [name] with neurology who did not feel that MRI would provide more information.  It was felt that her condition is likely just worsened from being deconditioned. They had no other recommendations to offer  -She is started back on Solu-Medrol from October 10 -Case discussed with oncology Dr. Benay Spice who recommend reconsult neurology, case discussed with neurology again today, will follow recommendation -EMG? LP?  Hyperesthesia -Reports significant pain bilateral lower extremity with light touch -Lyrica started October 11  Bilateral knee pain: -Bilateral knee x-ray showed degenerative changes no acute findings -Due to her continued musculoskeletal pain as well as joint pain,  --CRP and sed rate mildly elevated  -ANA, RF, CCP negative. Aldolase negative -Continue pain control, IV steroids, PT OT  -Added lyrica for any neuropathic component. Also scheduled oxycodone as patient has had trouble getting medication on time prn   History of rectal cancer -Follows with Dr. Benay Spice  Hypothyroidism -Continue Synthroid  GERD -Continue Protonix  Depression -Continue Zoloft  Hyperlipidemia -Continue Lipitor  Obesity Body mass index is 35.51 kg/m.     DVT prophylaxis:  Rivaroxaban (XARELTO) tablet 15 mg  rivaroxaban (XARELTO) tablet 20 mg  Code Status: Full Family Communication: daughter over the phone Disposition:   Status is:  Inpatient  Dispo: The patient is from: Home              Anticipated d/c is to: Skilled nursing facility              Anticipated d/c date is: To be determined              Patient currently not medically ready to discharge, awaiting neurology input  Consultants:   Neurology  oncology  Procedures:   None  Antimicrobials:   None     Objective: Vitals:   10/02/20 2011 10/02/20 2100 10/03/20 0452 10/03/20 0620  BP: 125/75  119/62   Pulse: (!) 101  85   Resp: 19 12 14 11   Temp: 99.1 F (37.3 C)  98.9 F (37.2 C)   TempSrc: Oral  Oral   SpO2: 94%  96%   Weight:      Height:        Intake/Output Summary (Last 24 hours) at 10/03/2020 0647 Last data filed at 10/03/2020 0401 Gross per 24 hour  Intake 600 ml  Output 800 ml  Net -200 ml   Filed Weights   09/28/20 0848  Weight: 99.8 kg    Examination:  General exam: Chronically ill-appearing Respiratory system: Diminished at bases, no rales, no rhonchi, no wheezing, respiratory effort normal. Cardiovascular system: S1 & S2 heard, RRR. No JVD, no murmur, No pedal edema. Gastrointestinal system: Abdomen is nondistended, soft and nontender.  Normal bowel sounds heard. Central nervous system: Alert and oriented. No focal neurological deficits. Extremities: Not able to lift bilateral lower extremity against gravity Skin: Left lower leg with dressing unwrapped round with gauze Psychiatry: Judgement and insight appear normal. Mood & affect appropriate.     Data Reviewed: I have personally reviewed following labs and imaging studies  CBC: Recent Labs  Lab 09/27/20 1738 09/28/20 0509 09/29/20 0224 09/30/20 0535 10/01/20 0509 10/02/20 0510 10/03/20 0500  WBC 8.0   < > 8.1 8.4 9.3 7.8 8.3  NEUTROABS 7.2  --   --   --   --   --   --   HGB 13.3   < > 11.2* 9.6* 8.8* 8.0* 7.8*  HCT 38.4   < > 32.2* 28.6* 25.8* 23.5* 23.1*  MCV 96.0   < > 97.3 100.0 98.1 98.7 100.9*  PLT 127*   < > 123* 133* 135* 125* 142*   < >  = values in this interval not displayed.    Basic Metabolic Panel: Recent Labs  Lab 09/27/20 1738 09/28/20 0509 09/29/20 0224  NA 141 141 140  K 3.7 3.3* 3.8  CL 102 103 103  CO2 29 29 29   GLUCOSE 145* 98 110*  BUN 28* 28* 33*  CREATININE 0.95 0.80 0.80  CALCIUM 9.2 8.8* 8.7*  MG  --   --  2.2    GFR: Estimated Creatinine Clearance: 75.8 mL/min (by C-G formula based on SCr of 0.8 mg/dL).  Liver Function Tests: Recent Labs  Lab 09/28/20 0509  AST 14*  ALT 15  ALKPHOS 60  BILITOT 1.5*  PROT 5.9*  ALBUMIN 3.5    CBG: No results for input(s): GLUCAP in the last 168 hours.   Recent Results (from the past 240 hour(s))  Respiratory Panel by RT PCR (Flu A&B, Covid) - Nasopharyngeal Swab     Status: None   Collection Time: 09/27/20  8:33 PM   Specimen:  Nasopharyngeal Swab  Result Value Ref Range Status   SARS Coronavirus 2 by RT PCR NEGATIVE NEGATIVE Final    Comment: (NOTE) SARS-CoV-2 target nucleic acids are NOT DETECTED.  The SARS-CoV-2 RNA is generally detectable in upper respiratoy specimens during the acute phase of infection. The lowest concentration of SARS-CoV-2 viral copies this assay can detect is 131 copies/mL. A negative result does not preclude SARS-Cov-2 infection and should not be used as the sole basis for treatment or other patient management decisions. A negative result may occur with  improper specimen collection/handling, submission of specimen other than nasopharyngeal swab, presence of viral mutation(s) within the areas targeted by this assay, and inadequate number of viral copies (<131 copies/mL). A negative result must be combined with clinical observations, patient history, and epidemiological information. The expected result is Negative.  Fact Sheet for Patients:  PinkCheek.be  Fact Sheet for Healthcare Providers:  GravelBags.it  This test is no t yet approved or cleared by  the [place] FDA and  has been authorized for detection and/or diagnosis of SARS-CoV-2 by FDA under an Emergency Use Authorization (EUA). This EUA will remain  in effect (meaning this test can be used) for the duration of the COVID-19 declaration under Section 564(b)(1) of the Act, 21 U.S.C. section 360bbb-3(b)(1), unless the authorization is terminated or revoked sooner.     Influenza A by PCR NEGATIVE NEGATIVE Final   Influenza B by PCR NEGATIVE NEGATIVE Final    Comment: (NOTE) The Xpert Xpress SARS-CoV-2/FLU/RSV assay is intended as an aid in  the diagnosis of influenza from Nasopharyngeal swab specimens and  should not be used as a sole basis for treatment. Nasal washings and  aspirates are unacceptable for Xpert Xpress SARS-CoV-2/FLU/RSV  testing.  Fact Sheet for Patients: PinkCheek.be  Fact Sheet for Healthcare Providers: GravelBags.it  This test is not yet approved or cleared by the [place] FDA and  has been authorized for detection and/or diagnosis of SARS-CoV-2 by  FDA under an Emergency Use Authorization (EUA). This EUA will remain  in effect (meaning this test can be used) for the duration of the  Covid-19 declaration under Section 564(b)(1) of the Act, 21  U.S.C. section 360bbb-3(b)(1), unless the authorization is  terminated or revoked. Performed at [facility], [place] [address] [place], [place] [address]          Radiology Studies: No results found.      Scheduled Meds: . atorvastatin  20 mg Oral Once per day on Sun Tue Thu Sat  . cholecalciferol  1,000 Units Oral Daily  . levothyroxine  75 mcg Oral QAC breakfast  . methylPREDNISolone (SOLU-MEDROL) injection  62.5 mg Intravenous Daily  . oxyCODONE  10 mg Oral Q4H  . oxyCODONE  10 mg Oral Q12H  . pantoprazole  40 mg Oral BID AC  . polyethylene glycol  17 g Oral QHS  . pregabalin  75 mg Oral BID  . rivaroxaban   15 mg Oral BID WC   Followed by  . [START ON 10/20/2020] rivaroxaban  20 mg Oral Q supper  . sertraline  200 mg Oral QPM   Continuous Infusions:   LOS: 6 days   Time spent: 31mins, case discussed with oncology and urology Greater than 50% of this time was spent in counseling, explanation of diagnosis, planning of further management, and coordination of care.  I have personally reviewed and interpreted on  10/03/2020 daily labs, tele strips, I reviewed all nursing notes, pharmacy notes, consultant  notes,  vitals, pertinent old records  I have discussed plan of care as described above with RN , patient and family on 10/03/2020  Voice Recognition /Dragon dictation system was used to create this note, attempts have been made to correct errors. Please contact the author with questions and/or clarifications.   Florencia Reasons, MD PhD FACP [facility]  Available via Epic secure chat 7am-7pm for nonurgent issues Please page for urgent issues To page the attending provider between 7A-7P or the covering provider during after hours 7P-7A, please log into the web site [email or web address] and access using universal [place] password for that web site. If you do not have the password, please call the hospital operator.    10/03/2020, 6:47 AM

## 2020-10-04 ENCOUNTER — Inpatient Hospital Stay (HOSPITAL_COMMUNITY): Payer: Medicare Other

## 2020-10-04 DIAGNOSIS — R296 Repeated falls: Secondary | ICD-10-CM | POA: Diagnosis not present

## 2020-10-04 DIAGNOSIS — I2609 Other pulmonary embolism with acute cor pulmonale: Secondary | ICD-10-CM | POA: Diagnosis not present

## 2020-10-04 DIAGNOSIS — C210 Malignant neoplasm of anus, unspecified: Secondary | ICD-10-CM | POA: Diagnosis not present

## 2020-10-04 DIAGNOSIS — R29898 Other symptoms and signs involving the musculoskeletal system: Secondary | ICD-10-CM

## 2020-10-04 DIAGNOSIS — J9611 Chronic respiratory failure with hypoxia: Secondary | ICD-10-CM | POA: Diagnosis not present

## 2020-10-04 DIAGNOSIS — G729 Myopathy, unspecified: Secondary | ICD-10-CM

## 2020-10-04 LAB — OCCULT BLOOD X 1 CARD TO LAB, STOOL: Fecal Occult Bld: POSITIVE — AB

## 2020-10-04 LAB — LACTIC ACID, PLASMA
Lactic Acid, Venous: 1.6 mmol/L (ref 0.5–1.9)
Lactic Acid, Venous: 1 mmol/L (ref 0.5–1.9)

## 2020-10-04 LAB — TSH: TSH: 13.877 u[IU]/mL — ABNORMAL HIGH (ref 0.350–4.500)

## 2020-10-04 MED ORDER — POLYETHYLENE GLYCOL 3350 17 G PO PACK
17.0000 g | PACK | Freq: Two times a day (BID) | ORAL | Status: DC
  Administered 2020-10-04 – 2020-10-10 (×10): 17 g via ORAL
  Filled 2020-10-04 (×11): qty 1

## 2020-10-04 MED ORDER — LORAZEPAM 2 MG/ML IJ SOLN: 1.0000 mg | Freq: Once | INTRAMUSCULAR | Status: DC

## 2020-10-04 NOTE — Progress Notes (Signed)
Brief oncology note:  The patient continues to have lower extremity weakness of unclear etiology.  Case was discussed yesterday by Dr. Benay Spice with hospitalist who recommends neurology reconsultation.  The patient will be discharging to SNF for rehabilitation.  Recommend continuation of Xarelto.  Outpatient follow-up with cancer will be scheduled.  Please call as needed for questions.Mikey Bussing, DNP, AGPCNP-BC, AOCNP Mon/Tues/Thurs/Fri 7am-5pm; Off Wednesdays Cell: [phone]

## 2020-10-04 NOTE — Progress Notes (Signed)
PROGRESS NOTE    AMYBETH SIEG  QJJ:[id number] DOB: November 13, 1946 DOA: 09/27/2020 PCP: Hoyt Koch, MD    Chief Complaint  Patient presents with   Fall    Brief Narrative:  Royden Purl a 74 y.o.femalewith medical history significant ofrectal cancer status post chemotherapy and radiation therapy who developed significant myopathy following treatment, and has significant lower extremity weakness, neuralgia, known degenerative disc disease of the lumbar spine, GERD, hypertension, fibromyalgia, overall weakness with poor mobility, morbid obesity as well as obstructive sleep apnea, hypertension, hypothyroidism, who presented to the hospital with worsening weakness as well as another fall. She was evaluated and was being admitted with failure to thrive at home due to weakness with plan for possible PT OT and placement. While being evaluated, patient was noted to be hypoxic and also tachycardic. Work-up with CT angiogram of the chest showed pulmonary embolism. Patient has been placed on oxygen and is being treated and admitted for pulmonary embolism in addition to her chronic problems. Hospitalization further complicated by severe bilateral knee pain, continued weakness. She is recommended to go to SNF on discharge.   Subjective:  Reports able to move toes more, but still Not able to lift legs up against gravity,   Denies bowel or bladder incontinence  She is on 2liter oxygen supplement, she denies sob  Assessment & Plan:   Principal Problem:   Pulmonary embolism ([place]) Active Problems:   Depression   Hypothyroidism   GERD (gastroesophageal reflux disease)   COPD GOLD II    Hyperlipidemia   Morbid obesity ([place])   Anal cancer ([place])   Chronic respiratory failure with hypoxia ([place])   Weakness of both legs   Falls frequently   PE and DVT -Provoked in setting of immobility, need at least 3 mo anticoagulation treatment -Venous Doppler ultrasound revealed  indeterminant DVT of the right lower extremity -Echo without heart strain -Xarelto  -Continue to monitor Hgb. Has been slowly trending downward but patient denies any overt blood loss   Acute hypoxemic respiratory failure -Secondary to above -Continue nasal cannula O2 and wean as able  macrocytic anemia: hgb slowing trending down, no bm, she does has left leg skin breakdown from fall prior to admission Would oozing minimal blood Will monitor hgb, check FOBT, check b12/folate  Lower extremity weakness -She was hospitalized for the same in July, had MRI of spine showed myositis, CK unremarkable -per patient she improved with steroid treatment however weakness got worse when steroid dose decreased in the last few weeks --EDP discussed with [name] with neurology who did not feel that MRI would provide more information.  It was felt that her condition is likely just worsened from being deconditioned. They had no other recommendations to offer  -She is started back on Solu-Medrol from October 10 -Case discussed with oncology Dr. Benay Spice who recommend reconsult neurology, case discussed with neurology  today, will follow recommendation   Hyperesthesia -Reports significant pain bilateral lower extremity with light touch -Lyrica started October 11  Bilateral knee pain: -Bilateral knee x-ray showed degenerative changes no acute findings -Due to her continued musculoskeletal pain as well as joint pain,  --CRP and sed rate mildly elevated  -ANA, RF, CCP negative. Aldolase negative -Continue pain control, IV steroids, PT OT  -Added lyrica for any neuropathic component. Also scheduled oxycodone as patient has had trouble getting medication on time prn   Left leg wound after fall Wound does not appear infected, continue daily dressing changes  History of  rectal cancer -Follows with Dr. Benay Spice  Hypothyroidism -Continue Synthroid  GERD -Continue  Protonix  Depression -Continue Zoloft  Hyperlipidemia -Continue Lipitor  Obesity Body mass index is 35.51 kg/m.     DVT prophylaxis:  Rivaroxaban (XARELTO) tablet 15 mg  rivaroxaban (XARELTO) tablet 20 mg   Code Status: Full Family Communication: daughter over the phone Disposition:   Status is: Inpatient  Dispo: The patient is from: Home              Anticipated d/c is to: Skilled nursing facility              Anticipated d/c date is: if cleared by neurology                Consultants:   Neurology  oncology  Procedures:   None  Antimicrobials:   None     Objective: Vitals:   10/03/20 0620 10/03/20 1447 10/03/20 2044 10/04/20 0517  BP:  130/73 (!) 150/72 137/80  Pulse:  86 (!) 102 92  Resp: 11 16 16 16   Temp:  99.1 F (37.3 C) 98.2 F (36.8 C) 98.2 F (36.8 C)  TempSrc:  Oral Oral Oral  SpO2:  98% 92% 98%  Weight:      Height:        Intake/Output Summary (Last 24 hours) at 10/04/2020 0707 Last data filed at 10/04/2020 0500 Gross per 24 hour  Intake 440 ml  Output 1350 ml  Net -910 ml   Filed Weights   09/28/20 0848  Weight: 99.8 kg    Examination:  General exam: Chronically ill-appearing Respiratory system: Diminished at bases, no rales, no rhonchi, no wheezing, respiratory effort normal. Cardiovascular system: S1 & S2 heard, RRR. No JVD, no murmur, No pedal edema. Gastrointestinal system: Abdomen is nondistended, soft and nontender.  Normal bowel sounds heard. Central nervous system: Alert and oriented. No focal neurological deficits. Extremities: Not able to lift bilateral lower extremity against gravity Skin: Left lower leg with dressing unwrapped round with gauze, left leg wound pic as shown below, does not appear infected Psychiatry: Judgement and insight appear normal. Mood & affect appropriate.       Data Reviewed: I have personally reviewed following labs and imaging studies  CBC: Recent Labs  Lab  09/27/20 1738 09/28/20 0509 09/29/20 0224 09/30/20 0535 10/01/20 0509 10/02/20 0510 10/03/20 0500  WBC 8.0   < > 8.1 8.4 9.3 7.8 8.3  NEUTROABS 7.2  --   --   --   --   --   --   HGB 13.3   < > 11.2* 9.6* 8.8* 8.0* 7.8*  HCT 38.4   < > 32.2* 28.6* 25.8* 23.5* 23.1*  MCV 96.0   < > 97.3 100.0 98.1 98.7 100.9*  PLT 127*   < > 123* 133* 135* 125* 142*   < > = values in this interval not displayed.    Basic Metabolic Panel: Recent Labs  Lab 09/27/20 1738 09/28/20 0509 09/29/20 0224 10/03/20 0500  NA 141 141 140 136  K 3.7 3.3* 3.8 5.1  CL 102 103 103 98  CO2 29 29 29 28   GLUCOSE 145* 98 110* 124*  BUN 28* 28* 33* 35*  CREATININE 0.95 0.80 0.80 0.78  CALCIUM 9.2 8.8* 8.7* 8.8*  MG  --   --  2.2  --     GFR: Estimated Creatinine Clearance: 75.8 mL/min (by C-G formula based on SCr of 0.78 mg/dL).  Liver Function Tests: Recent Labs  Lab  09/28/20 0509  AST 14*  ALT 15  ALKPHOS 60  BILITOT 1.5*  PROT 5.9*  ALBUMIN 3.5    CBG: No results for input(s): GLUCAP in the last 168 hours.   Recent Results (from the past 240 hour(s))  Respiratory Panel by RT PCR (Flu A&B, Covid) - Nasopharyngeal Swab     Status: None   Collection Time: 09/27/20  8:33 PM   Specimen: Nasopharyngeal Swab  Result Value Ref Range Status   SARS Coronavirus 2 by RT PCR NEGATIVE NEGATIVE Final    Comment: (NOTE) SARS-CoV-2 target nucleic acids are NOT DETECTED.  The SARS-CoV-2 RNA is generally detectable in upper respiratoy specimens during the acute phase of infection. The lowest concentration of SARS-CoV-2 viral copies this assay can detect is 131 copies/mL. A negative result does not preclude SARS-Cov-2 infection and should not be used as the sole basis for treatment or other patient management decisions. A negative result may occur with  improper specimen collection/handling, submission of specimen other than nasopharyngeal swab, presence of viral mutation(s) within the areas targeted  by this assay, and inadequate number of viral copies (<131 copies/mL). A negative result must be combined with clinical observations, patient history, and epidemiological information. The expected result is Negative.  Fact Sheet for Patients:  PinkCheek.be  Fact Sheet for Healthcare Providers:  GravelBags.it  This test is no t yet approved or cleared by the [place] FDA and  has been authorized for detection and/or diagnosis of SARS-CoV-2 by FDA under an Emergency Use Authorization (EUA). This EUA will remain  in effect (meaning this test can be used) for the duration of the COVID-19 declaration under Section 564(b)(1) of the Act, 21 U.S.C. section 360bbb-3(b)(1), unless the authorization is terminated or revoked sooner.     Influenza A by PCR NEGATIVE NEGATIVE Final   Influenza B by PCR NEGATIVE NEGATIVE Final    Comment: (NOTE) The Xpert Xpress SARS-CoV-2/FLU/RSV assay is intended as an aid in  the diagnosis of influenza from Nasopharyngeal swab specimens and  should not be used as a sole basis for treatment. Nasal washings and  aspirates are unacceptable for Xpert Xpress SARS-CoV-2/FLU/RSV  testing.  Fact Sheet for Patients: PinkCheek.be  Fact Sheet for Healthcare Providers: GravelBags.it  This test is not yet approved or cleared by the [place] FDA and  has been authorized for detection and/or diagnosis of SARS-CoV-2 by  FDA under an Emergency Use Authorization (EUA). This EUA will remain  in effect (meaning this test can be used) for the duration of the  Covid-19 declaration under Section 564(b)(1) of the Act, 21  U.S.C. section 360bbb-3(b)(1), unless the authorization is  terminated or revoked. Performed at [facility], [place] [address] [place], [place] [address]          Radiology Studies: No results  found.      Scheduled Meds:  atorvastatin  20 mg Oral Once per day on Sun Tue Thu Sat   cholecalciferol  1,000 Units Oral Daily   levothyroxine  75 mcg Oral QAC breakfast   methylPREDNISolone (SOLU-MEDROL) injection  62.5 mg Intravenous Daily   oxyCODONE  10 mg Oral Q4H   oxyCODONE  10 mg Oral Q12H   pantoprazole  40 mg Oral BID AC   polyethylene glycol  17 g Oral QHS   pregabalin  75 mg Oral BID   rivaroxaban  15 mg Oral BID WC   Followed by   Derrill Memo ON 10/20/2020] rivaroxaban  20 mg Oral Q  supper   senna-docusate  1 tablet Oral BID   sertraline  200 mg Oral QPM   Continuous Infusions:   LOS: 7 days   Time spent: 5mins, case discussed with oncology and neurology Greater than 50% of this time was spent in counseling, explanation of diagnosis, planning of further management, and coordination of care.  I have personally reviewed and interpreted on  10/04/2020 daily labs, tele strips, I reviewed all nursing notes, pharmacy notes, consultant notes,  vitals, pertinent old records  I have discussed plan of care as described above with RN , patient and family on 10/04/2020  Voice Recognition /Dragon dictation system was used to create this note, attempts have been made to correct errors. Please contact the author with questions and/or clarifications.   Florencia Reasons, MD PhD FACP [facility]  Available via Epic secure chat 7am-7pm for nonurgent issues Please page for urgent issues To page the attending provider between 7A-7P or the covering provider during after hours 7P-7A, please log into the web site [email or web address] and access using universal [place] password for that web site. If you do not have the password, please call the hospital operator.    10/04/2020, 7:07 AM

## 2020-10-04 NOTE — Consult Note (Addendum)
NEUROLOGY CONSULTATION NOTE   Date of service: October 04, 2020 Patient Name: Joanne Klein MRN:  [id number] DOB:  Nov 27, 1946 Reason for consult: "BL lower extremity weakness"  History of Present Illness  CATHARINA PICA is a 74 y.o. 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 Symptoms first started in December 2020. At that time, she noted some difficulty with walking but was able to perform all ADLs. She also was undergoing treatment for her cancer and attirbuted her symptoms to that. In march, she was requiring a walker to able to walk. She had most trouble with getting out of chair, was able to manage once she was on her feet. However, this has been getting more and more difficult and she has had falls. She was admitted in July and seen by our team. MRI T and L spine with and without contrast did not explain this. MRI pelvis with and without contrast was concerning for myositis with mild but new presacral edema along with edema tracking along and within the iliacus muscles, piriformis muscles, central gluteus muscles, and bilateral hip adductor musculature in a symmetric fashion.  She has been on atorvastatin 20mg  daily, denies any obvious muscle aches, her CK and aldolase has been low or normal during this. She is now unable to walk, has barely any strength in her BL lower extremities in both proximal and distal muscles. She has not had an EMG or muscle biopsy outpatient as requested by our team. Her arms are 5/5 and much stronger probably as a compensation to her BL lower extremity weakness.   ROS   Constitutional Denies weight loss, fever and chills.  HEENT Denies changes in vision and hearing.  Respiratory Denies SOB and cough.  CV Denies palpitations and CP  GI Denies abdominal pain, nausea, vomiting and diarrhea.  GU  Denies dysuria and urinary frequency.  MSK Denies myalgia and joint pain.  Skin Denies rash and pruritus.  Neurological Denies headache and syncope.  Psychiatric Denies recent changes in mood. Denies anxiety and depression.   Past History   Past Medical History:  Diagnosis Date  . Allergic rhinitis   . Arthritis   . Bursitis    Both Shoulders  . Chronic low back pain   . Degenerative disk disease   . Depression   . Diverticulitis   . Emphysema of lung ([place])   . Fibromyalgia   . GERD (gastroesophageal reflux disease)   . History of fibromyalgia   . Hypertension   . Hypothyroidism   . Irritable bowel syndrome   . Neuralgia, post-herpetic   . Obesity, unspecified   . Obstructive sleep apnea   . OCD (obsessive compulsive disorder)   . Pain in joint, site unspecified   . Pure hypercholesterolemia   . Scoliosis   . Situational stress   . Spastic colon   . Tubular adenoma   . Uterine cancer [facility])    Past Surgical History:  Procedure Laterality Date  . BREAST LUMPECTOMY     knot removed  . CHOLECYSTECTOMY    . HYSTEROTOMY    . RADIOLOGY WITH ANESTHESIA Bilateral 06/14/2020   Procedure: MRI WITH ANESTHESIA;  Surgeon: Radiologist, Medication, MD;  Location: [facility];  Service: Radiology;  Laterality: Bilateral;  . RADIOLOGY WITH ANESTHESIA N/A 06/21/2020   Procedure: MRI WITH ANESTHESIA;  Surgeon: Radiologist, Medication, MD;  Location: [facility];  Service: Radiology;  Laterality: N/A;  . TONSILLECTOMY    . tumor in left forearm     Family History  Problem Relation Age of Onset  . Stomach cancer Father   . Liver cancer Father   . Diabetes Father   . Colon cancer Neg Hx    Social History   Socioeconomic History  . Marital status: Divorced    Spouse name: Not on file  . Number of children: 1  . Years of education: Not on file  . Highest education level: Not on file  Occupational History  . Occupation: retired  Tobacco Use  . Smoking status: Former Smoker    Packs/day:  2.00    Years: 34.00    Pack years: 68.00    Types: Cigarettes    Quit date: 12/15/1996    Years since quitting: 23.8  . Smokeless tobacco: Never Used  . Tobacco comment: vapes x 2 years  Vaping Use  . Vaping Use: Every day  . Substances: Nicotine  Substance and Sexual Activity  . Alcohol use: Not Currently    Alcohol/week: 0.0 standard drinks    Comment: Caffine Intake 2x's daily  . Drug use: No  . Sexual activity: Not on file  Other Topics Concern  . Not on file  Social History Narrative  . Not on file   Social Determinants of Health   Financial Resource Strain:   . Difficulty of Paying Living Expenses: Not on file  Food Insecurity:   . Worried About Charity fundraiser in the Last Year: Not on file  . Ran Out of Food in the Last Year: Not on file  Transportation Needs: No Transportation Needs  . Lack of Transportation (Medical): No  . Lack of Transportation (Non-Medical): No  Physical Activity:   . Days of Exercise per Week: Not on file  . Minutes of Exercise per Session: Not on file  Stress:   . Feeling of Stress : Not on file  Social Connections:   . Frequency of Communication with Friends and Family: Not on file  . Frequency of Social Gatherings with Friends and Family: Not on file  . Attends Religious Services: Not on file  . Active Member of Clubs or Organizations: Not on file  . Attends Archivist Meetings: Not on file  . Marital Status: Not on file   Allergies  Allergen Reactions  . Prozac [Fluoxetine Hcl] Anaphylaxis  . Codeine Itching  . Morphine And Related Itching  . Sulfa Antibiotics Itching and Nausea Only    Medications   Medications Prior to Admission  Medication Sig Dispense Refill Last Dose  . atorvastatin (LIPITOR) 20 MG tablet TAKE 1 TABLET BY MOUTH  EVERY OTHER DAY (Patient taking differently: Take 20 mg by mouth 4 (four) times a week. Saturday, Sunday, Tuesday, and Thursday) 45 tablet 3 09/25/2020  . calcium carbonate (TUMS -  DOSED IN MG ELEMENTAL CALCIUM) 500 MG chewable tablet Chew 1 tablet by mouth daily as needed for indigestion or heartburn.    09/26/2020 at Unknown time  . Cholecalciferol (VITAMIN D-3) 1000 UNITS CAPS Take 1,000 Units by mouth daily.    09/26/2020 at Unknown time  . levothyroxine (SYNTHROID) 75 MCG tablet TAKE 1 TABLET BY MOUTH  DAILY BEFORE BREAKFAST (Patient taking differently: Take 75 mcg by mouth daily before breakfast. ) 90 tablet 3 09/27/2020 at Unknown time  . omeprazole (PRILOSEC) 40 MG capsule TAKE 1 CAPSULE BY MOUTH  DAILY (Patient taking differently: Take  40 mg by mouth daily. ) 90 capsule 3 09/27/2020 at Unknown time  . ondansetron (ZOFRAN) 8 MG tablet Take 8 mg by mouth every 8 (eight) hours as needed for nausea or vomiting.   09/26/2020 at Unknown time  . oxyCODONE-acetaminophen (PERCOCET) 10-325 MG tablet Take 1 tablet by mouth every 4 (four) hours.    09/27/2020 at Unknown time  . phenylephrine-shark liver oil-mineral oil-petrolatum (PREPARATION H) 0.25-3-14-71.9 % rectal ointment Place 1 application rectally 2 (two) times daily as needed for hemorrhoids.   Past Week at Unknown time  . Polyethylene Glycol 3350 (MIRALAX PO) Take 17 g by mouth at bedtime.    09/26/2020 at Unknown time  . predniSONE (DELTASONE) 20 MG tablet Take 1.5 tablets (30 mg total) by mouth daily with breakfast. 45 tablet 0 09/27/2020 at Unknown time  . sertraline (ZOLOFT) 100 MG tablet TAKE 2 TABLETS BY MOUTH AT  BEDTIME (Patient taking differently: Take 200 mg by mouth every evening. ) 180 tablet 3 09/26/2020 at Unknown time  . prochlorperazine (COMPAZINE) 10 MG tablet Take 10 mg by mouth every 6 (six) hours as needed for nausea or vomiting.   unk     Vitals   Vitals:   10/03/20 1447 10/03/20 2044 10/04/20 0517 10/04/20 1252  BP: 130/73 (!) 150/72 137/80 133/67  Pulse: 86 (!) 102 92 95  Resp: 16 16 16 20   Temp: 99.1 F (37.3 C) 98.2 F (36.8 C) 98.2 F (36.8 C) 98.1 F (36.7 C)  TempSrc: Oral Oral Oral Oral   SpO2: 98% 92% 98% 98%  Weight:      Height:         Body mass index is 35.51 kg/m.  Physical Exam   General: Laying comfortably in bed; in no acute distress. HENT: Normal oropharynx and mucosa. Normal external appearance of ears and nose. Neck: Supple, no pain or tenderness CV: No JVD. No peripheral edema. Pulmonary: Symmetric Chest rise. Normal respiratory effort. Abdomen: Soft to touch, non-tender. Ext: No cyanosis, edema, or deformity Skin: No rash. Normal palpation of skin.  Musculoskeletal: Normal digits and nails by inspection. No clubbing.  Neurologic Examination  Mental status/Cognition: Alert, oriented to self, place, month and year, good attention. Speech/language: Fluent, comprehension intact, object naming intact, repetition intact. Cranial nerves:   CN II Pupils equal and reactive to light, no VF deficits   CN III,IV,VI EOM intact, no gaze preference or deviation, no nystagmus   CN V normal sensation in V1, V2, and V3 segments bilaterally   CN VII no asymmetry, no nasolabial fold flattening   CN VIII normal hearing to speech   CN IX & X normal palatal elevation, no uvular deviation   CN XI 5/5 head turn and 5/5 shoulder shrug bilaterally   CN XII midline tongue protrusion   Motor:  Muscle bulk: poor in BL lower extremities, tone hypotonic, pronator drift none tremor none.  Tenderness on deep palpation of BL quadriceps and calfs. No tenderness in BL upper extremities.  Mvmt Root Nerve  Muscle Right Left Comments  SA C5/6 Ax Deltoid 5 5   EF C5/6 Mc Biceps 5 5   EE C6/7/8 Rad Triceps 5 5   WF C6/7 Med FCR 5 5   WE C7/8 PIN ECU 5 5   F Ab C8/T1 U ADM/FDI 5 5   HF L1/2/3 Fem Illopsoas 1 1   KE L2/3/4 Fem Quad 1 1   DF L4/5 D Peron Tib Ant 1 1   PF S1/2 Tibial  Grc/Sol 1 1    Reflexes:  Right Left Comments  Pectoralis      Biceps (C5/6) 2+ 2+   Brachioradialis (C5/6) 2+ 2+    Triceps (C6/7) 2+ 2+    Patellar (L3/4) 0 0    Achilles (S1) 0 0    Hoffman       Plantar mute mute   Jaw jerk    Sensation:  Light touch intact   Pin prick intact   Temperature intact   Vibration Absent in BL feet  Proprioception Absent in BL big toes.   Coordination/Complex Motor:  - Finger to Nose intact BL - Heel to shin unable to assess - Rapid alternating movement unable to assess  Labs   CBC:  Recent Labs  Lab 09/27/20 1738 09/28/20 0509 10/02/20 0510 10/03/20 0500  WBC 8.0   < > 7.8 8.3  NEUTROABS 7.2  --   --   --   HGB 13.3   < > 8.0* 7.8*  HCT 38.4   < > 23.5* 23.1*  MCV 96.0   < > 98.7 100.9*  PLT 127*   < > 125* 142*   < > = values in this interval not displayed.    Basic Metabolic Panel:  Lab Results  Component Value Date   NA 136 10/03/2020   K 5.1 10/03/2020   CO2 28 10/03/2020   GLUCOSE 124 (H) 10/03/2020   BUN 35 (H) 10/03/2020   CREATININE 0.78 10/03/2020   CALCIUM 8.8 (L) 10/03/2020   GFRNONAA >60 10/03/2020   GFRAA >60 08/06/2020   Lipid Panel:  Lab Results  Component Value Date   LDLCALC 119 (H) 02/17/2019   HgbA1c:  Lab Results  Component Value Date   HGBA1C 5.2 02/17/2019   Urine Drug Screen: No results found for: LABOPIA, COCAINSCRNUR, LABBENZ, AMPHETMU, THCU, LABBARB  Alcohol Level No results found for: ETH    Impression   TASNIM BALENTINE is a 74 y.o. 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 Her neurologic examination is notable for flaccid weakness of BL lower extermties that is slowly progressive over a year with intact strength in BL upper extremities. MRI B, C, T and L spine with and without contrast about 7 months into her symptoms does not explain this. She has tenderness to deep palpation of her leg muscles.  However, her CK and Aldolase is not elevated and therefore not suggestive of an active myositic procoess. I am not  very inclined to give her steroids at this time specially with its potential to cause muscle atrophy and the fact that it can confound the results of a muscle biopsy in the future. Potential mitochondrial disorder, Rheumatoid disease, SLE, hyperthyroidism could lead to a low CK Myopathy. We will get a [facility] Myopathy 2 panel for evaluation for potential autoimmune antibodies.  Recommendations  - I stopped Atorvastatin 20mg  daily because of its association with HMG CoA reductase Ab induced myopathy. - MRI Left Femur with and without contrast to assess for any edema/enhancement of thigh muscles. - I ordered [employer] Myopathy 2 panel ([email or web address]) - I ordered serum Lactate, Pyruvate, ANA, ENA, Serum RF, DSDNA, TSH, SPEP, Serum Paraneoplastic panel. - I am not particularly inclined towards giving her steroids at this time, her CK and aldolase are low and not suggestive of an active myositis. Steroids can also lead  to muscle atrophy. - I strongly recommend expedited follow up with Neuromuscular (Dr. Marcial Pacas) with an EMG and NCS by a Neuromuscular specialist only and potential muscle biopsy. ______________________________________________________________________   Thank you for the opportunity to take part in the care of this patient. If you have any further questions, please contact the neurology consultation attending.  Signed,  [place] Pager Number [phone]

## 2020-10-04 NOTE — Progress Notes (Signed)
Lab Pyruvic Acid, blood was ordered for this patient. Lab notified this RN that a special tube is needed for this order. Lab stated the tube has been ordered but will more then likely take days to arrive. Lab will let pt assigning RN know when tube has arrived and if pt still needs this lab order a new order will need to be placed. This RN has notified patient's attending Erlinda Hong, MD of this even though Erlinda Hong, MD did not put in order.

## 2020-10-04 NOTE — Care Management Important Message (Signed)
Important Message  Patient Details IM Letter given to the Patient Name: Joanne Klein MRN: [id number] Date of Birth: 11-14-1946   Medicare Important Message Given:  Yes     [name] 10/04/2020, 10:38 AM

## 2020-10-04 NOTE — Progress Notes (Signed)
MRI called at 1910 to get transport for scan. PRN requested for anti-anxiety because patient is claustrophobic and does not want to go further up in the scanner than her abdomen. When MRI tech arrived, patient was notified she would be in the scanner up to her neck and refused scanning. Pt notified that she did have PRN Ativan ordered and the RN would escort her down, but she still refused.

## 2020-10-04 NOTE — Progress Notes (Signed)
Went to get patient for her MRI (3rd attempt) patient refused her scan due to severe claustrophobia. Rn had meds for patient but she still refused she stated " You will have to take me to [facility]". Rn went into the room to talk with her and she refused with him also.

## 2020-10-05 DIAGNOSIS — D62 Acute posthemorrhagic anemia: Secondary | ICD-10-CM | POA: Diagnosis not present

## 2020-10-05 DIAGNOSIS — Z85048 Personal history of other malignant neoplasm of rectum, rectosigmoid junction, and anus: Secondary | ICD-10-CM

## 2020-10-05 DIAGNOSIS — R296 Repeated falls: Secondary | ICD-10-CM | POA: Diagnosis not present

## 2020-10-05 DIAGNOSIS — R195 Other fecal abnormalities: Secondary | ICD-10-CM | POA: Diagnosis not present

## 2020-10-05 DIAGNOSIS — I2609 Other pulmonary embolism with acute cor pulmonale: Secondary | ICD-10-CM | POA: Diagnosis not present

## 2020-10-05 DIAGNOSIS — J9611 Chronic respiratory failure with hypoxia: Secondary | ICD-10-CM | POA: Diagnosis not present

## 2020-10-05 DIAGNOSIS — Z86711 Personal history of pulmonary embolism: Secondary | ICD-10-CM

## 2020-10-05 DIAGNOSIS — C210 Malignant neoplasm of anus, unspecified: Secondary | ICD-10-CM | POA: Diagnosis not present

## 2020-10-05 LAB — URINALYSIS, ROUTINE W REFLEX MICROSCOPIC
Bilirubin Urine: NEGATIVE
Glucose, UA: NEGATIVE mg/dL
Hgb urine dipstick: NEGATIVE
Ketones, ur: NEGATIVE mg/dL
Nitrite: NEGATIVE
Protein, ur: NEGATIVE mg/dL
Specific Gravity, Urine: 1.019 (ref 1.005–1.030)
pH: 5 (ref 5.0–8.0)

## 2020-10-05 LAB — VITAMIN B12: Vitamin B-12: 185 pg/mL (ref 180–914)

## 2020-10-05 LAB — CBC
HCT: 25.9 % — ABNORMAL LOW (ref 36.0–46.0)
Hemoglobin: 8.6 g/dL — ABNORMAL LOW (ref 12.0–15.0)
MCH: 33.3 pg (ref 26.0–34.0)
MCHC: 33.2 g/dL (ref 30.0–36.0)
MCV: 100.4 fL — ABNORMAL HIGH (ref 80.0–100.0)
Platelets: 143 10*3/uL — ABNORMAL LOW (ref 150–400)
RBC: 2.58 MIL/uL — ABNORMAL LOW (ref 3.87–5.11)
RDW: 14.2 % (ref 11.5–15.5)
WBC: 10 10*3/uL (ref 4.0–10.5)
nRBC: 0.4 % — ABNORMAL HIGH (ref 0.0–0.2)

## 2020-10-05 LAB — HEMOGLOBIN AND HEMATOCRIT, BLOOD
HCT: 25.8 % — ABNORMAL LOW (ref 36.0–46.0)
Hemoglobin: 8.5 g/dL — ABNORMAL LOW (ref 12.0–15.0)

## 2020-10-05 LAB — ANTIEXTRACTABLE NUCLEAR AG
ENA SM Ab Ser-aCnc: <0.2 AI (ref 0.0–0.9)
Ribonucleic Protein: <0.2 AI (ref 0.0–0.9)

## 2020-10-05 LAB — SARS CORONAVIRUS 2 BY RT PCR (HOSPITAL ORDER, PERFORMED IN ~~LOC~~ HOSPITAL LAB): SARS Coronavirus 2: NEGATIVE

## 2020-10-05 LAB — ANTI-DNA ANTIBODY, DOUBLE-STRANDED: ds DNA Ab: <1 IU/mL (ref 0–9)

## 2020-10-05 LAB — ANTINUCLEAR ANTIBODIES, IFA: ANA Ab, IFA: NEGATIVE

## 2020-10-05 LAB — RHEUMATOID FACTOR: Rheumatoid fact SerPl-aCnc: 11.8 IU/mL (ref 0.0–13.9)

## 2020-10-05 LAB — FOLATE: Folate: 6.8 ng/mL (ref >5.9)

## 2020-10-05 NOTE — Progress Notes (Addendum)
PROGRESS NOTE    Joanne Klein  NWG:[id number] DOB: [birth year] DOA: 09/27/2020 PCP: Hoyt Koch, MD    Chief Complaint  Patient presents with  . Fall    Brief Narrative:  Joanne Klein a 74 y.o.femalewith medical history significant ofrectal cancer status post chemotherapy and radiation therapy who developed significant myopathy following treatment, and has significant lower extremity weakness, neuralgia, known degenerative disc disease of the lumbar spine, GERD, hypertension, fibromyalgia, overall weakness with poor mobility, morbid obesity as well as obstructive sleep apnea, hypertension, hypothyroidism, who presented to the hospital with worsening weakness as well as another fall. She was evaluated and was being admitted with failure to thrive at home due to weakness with plan for possible PT OT and placement. While being evaluated, patient was noted to be hypoxic and also tachycardic. Work-up with CT angiogram of the chest showed pulmonary embolism. Patient has been placed on oxygen and is being treated and admitted for pulmonary embolism in addition to her chronic problems. Hospitalization further complicated by severe bilateral knee pain, continued weakness. She is recommended to go to SNF on discharge.   Subjective:  Reports able to move toes more, but still Not able to lift legs up against gravity,   Denies bowel or bladder incontinence  She is on 2liter oxygen supplement, she denies sob  Assessment & Plan:   Principal Problem:   Pulmonary embolism ([place]) Active Problems:   Depression   Hypothyroidism   GERD (gastroesophageal reflux disease)   COPD GOLD II    Hyperlipidemia   Morbid obesity ([place])   Anal cancer ([place])   Chronic respiratory failure with hypoxia ([place])   Weakness of both legs   Falls frequently   PE and DVT -Provoked in setting of immobility, need at least 3 mo anticoagulation treatment -Venous Doppler ultrasound revealed  indeterminant DVT of the right lower extremity -Echo without heart strain -Xarelto  -Continue to monitor Hgb. Has been slowly trending downward but patient denies any overt blood loss   Acute hypoxemic respiratory failure -Secondary to above -Continue nasal cannula O2 and wean as able  macrocytic anemia: hgb slowing trending down, she does has left leg skin breakdown from fall prior to admission Wound oozing minimal blood  FOBT + , reports brown stool with streaks of blood, reports h/o hemorrhoid Reports h/o gastric ulcer 17yrs ago, denies epigastric pain Case discussed with oncology due to her history of anal cancer , per oncology Dr. Lorenso Courier anal cancer is considered treated, he recommended GI consult Will discuss with GI b12/folate unremarkable  Lower extremity weakness -She was hospitalized for the same in July, had MRI of spine showed myositis, CK unremarkable -per patient she improved with steroid treatment however weakness got worse when steroid dose decreased in the last few weeks --EDP discussed with [name] with neurology who did not feel that MRI would provide more information.  It was felt that her condition is likely just worsened from being deconditioned. They had no other recommendations to offer  -She is started back on Solu-Medrol from October 10 -Appreciate neurology consult , neurology initially recommended MRI of the thigh all over patient's not able to tolerate , case discussed with neurology again who recommend outpatient follow-up with neuromuscular specialist [name] for EMG and muscle biopsy .message sent to Dr. Krista Blue.   Hyperesthesia -Reports significant pain bilateral lower extremity with light touch -Lyrica started October 11, appear improving  Bilateral knee pain: -Bilateral knee x-ray showed degenerative changes no acute findings -  Due to her continued musculoskeletal pain as well as joint pain,  --CRP and sed rate mildly elevated  -ANA, RF, CCP  negative. Aldolase negative -Continue pain control, IV steroids, PT OT  -Added lyrica for any neuropathic component. Also scheduled oxycodone as patient has had trouble getting medication on time prn   Left leg wound after fall Wound does not appear infected, continue daily dressing changes  History of rectal cancer -Follows with Dr. Benay Spice  Hypothyroidism -Continue Synthroid  GERD -Continue Protonix  Depression -Continue Zoloft  Hyperlipidemia -Continue Lipitor  Obesity Body mass index is 35.51 kg/m.     DVT prophylaxis:  Rivaroxaban (XARELTO) tablet 15 mg  rivaroxaban (XARELTO) tablet 20 mg   Code Status: Full Family Communication: daughter over the phone Disposition:   Status is: Inpatient  Dispo: The patient is from: Home              Anticipated d/c is to: Skilled nursing facility              Anticipated d/c date is: possibly tomorrow if hgb stable                 Consultants:   Neurology  oncology  Procedures:   None  Antimicrobials:   None     Objective: Vitals:   10/03/20 2044 10/04/20 0517 10/04/20 1252 10/04/20 2130  BP: (!) 150/72 137/80 133/67 (!) 142/69  Pulse: (!) 102 92 95 (!) 101  Resp: 16 16 20 18   Temp:  98.2 F (36.8 C) 98.1 F (36.7 C) 98.5 F (36.9 C)  TempSrc: Oral Oral Oral Oral  SpO2: 92% 98% 98% 96%  Weight:      Height:        Intake/Output Summary (Last 24 hours) at 10/05/2020 1103 Last data filed at 10/05/2020 0600 Gross per 24 hour  Intake 320 ml  Output 701 ml  Net -381 ml   Filed Weights   09/28/20 0848  Weight: 99.8 kg    Examination:  General exam: Chronically ill-appearing Respiratory system: Diminished at bases, no rales, no rhonchi, no wheezing, respiratory effort normal. Cardiovascular system: S1 & S2 heard, RRR. No JVD, no murmur, No pedal edema. Gastrointestinal system: Abdomen is nondistended, soft and nontender.  Normal bowel sounds heard. Central nervous system: Alert and  oriented. No focal neurological deficits. Extremities: Not able to lift bilateral lower extremity against gravity Skin: Left lower leg with dressing unwrapped round with gauze, left leg wound pic as shown below, does not appear infected Psychiatry: Judgement and insight appear normal. Mood & affect appropriate.       Data Reviewed: I have personally reviewed following labs and imaging studies  CBC: Recent Labs  Lab 09/29/20 0224 09/30/20 0535 10/01/20 0509 10/02/20 0510 10/03/20 0500  WBC 8.1 8.4 9.3 7.8 8.3  HGB 11.2* 9.6* 8.8* 8.0* 7.8*  HCT 32.2* 28.6* 25.8* 23.5* 23.1*  MCV 97.3 100.0 98.1 98.7 100.9*  PLT 123* 133* 135* 125* 142*    Basic Metabolic Panel: Recent Labs  Lab 09/29/20 0224 10/03/20 0500  NA 140 136  K 3.8 5.1  CL 103 98  CO2 29 28  GLUCOSE 110* 124*  BUN 33* 35*  CREATININE 0.80 0.78  CALCIUM 8.7* 8.8*  MG 2.2  --     GFR: Estimated Creatinine Clearance: 75.8 mL/min (by C-G formula based on SCr of 0.78 mg/dL).  Liver Function Tests: No results for input(s): AST, ALT, ALKPHOS, BILITOT, PROT, ALBUMIN in the last 168 hours.  CBG: No results for input(s): GLUCAP in the last 168 hours.   Recent Results (from the past 240 hour(s))  Respiratory Panel by RT PCR (Flu A&B, Covid) - Nasopharyngeal Swab     Status: None   Collection Time: 09/27/20  8:33 PM   Specimen: Nasopharyngeal Swab  Result Value Ref Range Status   SARS Coronavirus 2 by RT PCR NEGATIVE NEGATIVE Final    Comment: (NOTE) SARS-CoV-2 target nucleic acids are NOT DETECTED.  The SARS-CoV-2 RNA is generally detectable in upper respiratoy specimens during the acute phase of infection. The lowest concentration of SARS-CoV-2 viral copies this assay can detect is 131 copies/mL. A negative result does not preclude SARS-Cov-2 infection and should not be used as the sole basis for treatment or other patient management decisions. A negative result may occur with  improper specimen  collection/handling, submission of specimen other than nasopharyngeal swab, presence of viral mutation(s) within the areas targeted by this assay, and inadequate number of viral copies (<131 copies/mL). A negative result must be combined with clinical observations, patient history, and epidemiological information. The expected result is Negative.  Fact Sheet for Patients:  PinkCheek.be  Fact Sheet for Healthcare Providers:  GravelBags.it  This test is no t yet approved or cleared by the [place] FDA and  has been authorized for detection and/or diagnosis of SARS-CoV-2 by FDA under an Emergency Use Authorization (EUA). This EUA will remain  in effect (meaning this test can be used) for the duration of the COVID-19 declaration under Section 564(b)(1) of the Act, 21 U.S.C. section 360bbb-3(b)(1), unless the authorization is terminated or revoked sooner.     Influenza A by PCR NEGATIVE NEGATIVE Final   Influenza B by PCR NEGATIVE NEGATIVE Final    Comment: (NOTE) The Xpert Xpress SARS-CoV-2/FLU/RSV assay is intended as an aid in  the diagnosis of influenza from Nasopharyngeal swab specimens and  should not be used as a sole basis for treatment. Nasal washings and  aspirates are unacceptable for Xpert Xpress SARS-CoV-2/FLU/RSV  testing.  Fact Sheet for Patients: PinkCheek.be  Fact Sheet for Healthcare Providers: GravelBags.it  This test is not yet approved or cleared by the [place] FDA and  has been authorized for detection and/or diagnosis of SARS-CoV-2 by  FDA under an Emergency Use Authorization (EUA). This EUA will remain  in effect (meaning this test can be used) for the duration of the  Covid-19 declaration under Section 564(b)(1) of the Act, 21  U.S.C. section 360bbb-3(b)(1), unless the authorization is  terminated or revoked. Performed at [facility], [place] [address], [place], [place] [address]          Radiology Studies: No results found.      Scheduled Meds: . cholecalciferol  1,000 Units Oral Daily  . levothyroxine  75 mcg Oral QAC breakfast  . LORazepam  1 mg Intravenous Once  . methylPREDNISolone (SOLU-MEDROL) injection  62.5 mg Intravenous Daily  . oxyCODONE  10 mg Oral Q4H  . oxyCODONE  10 mg Oral Q12H  . pantoprazole  40 mg Oral BID AC  . polyethylene glycol  17 g Oral BID  . pregabalin  75 mg Oral BID  . rivaroxaban  15 mg Oral BID WC   Followed by  . [START ON 10/20/2020] rivaroxaban  20 mg Oral Q supper  . senna-docusate  1 tablet Oral BID  . sertraline  200 mg Oral QPM   Continuous Infusions:   LOS: 8 days   Time spent:  35mins, case discussed with neurology Greater than 50% of this time was spent in counseling, explanation of diagnosis, planning of further management, and coordination of care.  I have personally reviewed and interpreted on  10/05/2020 daily labs, tele strips, I reviewed all nursing notes, pharmacy notes, consultant notes,  vitals, pertinent old records  I have discussed plan of care as described above with RN , patient and family on 10/05/2020  Voice Recognition /Dragon dictation system was used to create this note, attempts have been made to correct errors. Please contact the author with questions and/or clarifications.   Florencia Reasons, MD PhD FACP [facility]  Available via Epic secure chat 7am-7pm for nonurgent issues Please page for urgent issues To page the attending provider between 7A-7P or the covering provider during after hours 7P-7A, please log into the web site [email or web address] and access using universal [place] password for that web site. If you do not have the password, please call the hospital operator.    10/05/2020, 11:03 AM

## 2020-10-05 NOTE — Consult Note (Signed)
Consultation  Referring Provider: [facility]/Dr.Xu primary Care Physician:  Hoyt Koch, MD Primary Gastroenterologist:  Dr.Danis  Reason for Consultation: Anemia with drop in hemoglobin, heme positive stool and complaints of low-grade hematochezia   HPI: Joanne Klein is a 74 y.o. female ,, admitted 1 week ago after fall at home and with complaints of progressive weakness..  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  She has been followed by Dr. Benay Spice and completed a course of chemotherapy and radiation in December 2020.  She developed a significant myopathy following chemotherapy.  She also has history of GERD, hypertension, fibromyalgia OCD, and morbid obesity's as well as sleep apnea and hypertension. While she was being evaluated for the weakness etc. in the ER noted to be hypoxic and tachycardic and CT angio of the chest showed an acute pulmonary embolism.  She since had work-up showing positive DVT and right heart strain.  Neurology has been involved,, she has been on steroid therapy over the past few months..  She has been started on Xarelto for the DVT/PE. Since admission she has had a drop in hemoglobin from 13.3 down to 8..  She also has a mild thrombocytopenia current platelet count of 143.  Patient says she has been noticing some discomfort in the left midabdomen but cannot tell me whether that was present prior to admission, or not.  She has had long-term problems with what sounds like IBS with alternating bowel habits.  She says she has been noticing blood streaked with her bowel movements over the past year, and did not notice that that had stopped after chemotherapy and radiation.  She has attributed this to hemorrhoids.  She has not noticed any melena.  She has no complaints of rectal pain. She does have intermittent heartburn and indigestion, no dysphagia. She did not undergo colonoscopy at the time of diagnosis of the  anal cancer, last colonoscopy 2015 per Dr. Deatra Ina with finding of 2 8 mm sessile polyps and severe diverticulosis.  Path on the polyps consistent with tubular adenomas and she was recommended for 5-year interval follow-up.  Last abdominal imaging was MRI of the pelvis in July 2021 showing extensive sigmoid diverticulosis, no active diverticulitis no clearly defined mass or asymmetry in the anal/perianal region or in the adjacent rectum, there was no rectal protocol used.  There was new mild presacral edema with edema tracking along and within the iliac [place] muscles, piriformis muscles and central gluteus muscles and bilateral hip abductor musculature may reflect myositis, association with prior regional radiation therapy not excluded.    Past Medical History:  Diagnosis Date  . Allergic rhinitis   . Arthritis   . Bursitis    Both Shoulders  . Chronic low back pain   . Degenerative disk disease   . Depression   . Diverticulitis   . Emphysema of lung ([place])   . Fibromyalgia   . GERD (gastroesophageal reflux disease)   . History of fibromyalgia   . Hypertension   . Hypothyroidism   . Irritable bowel syndrome   . Neuralgia, post-herpetic   . Obesity, unspecified   . Obstructive sleep apnea   . OCD (obsessive compulsive disorder)   . Pain in joint, site unspecified   . Pure hypercholesterolemia   . Scoliosis   . Situational stress   . Spastic colon   . Tubular adenoma   . Uterine cancer [facility])     Past Surgical History:  Procedure  Laterality Date  . BREAST LUMPECTOMY     knot removed  . CHOLECYSTECTOMY    . HYSTEROTOMY    . RADIOLOGY WITH ANESTHESIA Bilateral 06/14/2020   Procedure: MRI WITH ANESTHESIA;  Surgeon: Radiologist, Medication, MD;  Location: [place];  Service: Radiology;  Laterality: Bilateral;  . RADIOLOGY WITH ANESTHESIA N/A 06/21/2020   Procedure: MRI WITH ANESTHESIA;  Surgeon: Radiologist, Medication, MD;  Location: [place];  Service: Radiology;  Laterality: N/A;  .  TONSILLECTOMY    . tumor in left forearm      Prior to Admission medications   Medication Sig Start Date End Date Taking? Authorizing Provider  atorvastatin (LIPITOR) 20 MG tablet TAKE 1 TABLET BY MOUTH  EVERY OTHER DAY Patient taking differently: Take 20 mg by mouth 4 (four) times a week. Saturday, Sunday, Tuesday, and Thursday 02/13/20  Yes Hoyt Koch, MD  calcium carbonate (TUMS - DOSED IN MG ELEMENTAL CALCIUM) 500 MG chewable tablet Chew 1 tablet by mouth daily as needed for indigestion or heartburn.    Yes [provider]  Cholecalciferol (VITAMIN D-3) 1000 UNITS CAPS Take 1,000 Units by mouth daily.    Yes [provider]  levothyroxine (SYNTHROID) 75 MCG tablet TAKE 1 TABLET BY MOUTH  DAILY BEFORE BREAKFAST Patient taking differently: Take 75 mcg by mouth daily before breakfast.  02/07/20  Yes Hoyt Koch, MD  omeprazole (PRILOSEC) 40 MG capsule TAKE 1 CAPSULE BY MOUTH  DAILY Patient taking differently: Take 40 mg by mouth daily.  02/07/20  Yes Hoyt Koch, MD  ondansetron (ZOFRAN) 8 MG tablet Take 8 mg by mouth every 8 (eight) hours as needed for nausea or vomiting.   Yes [provider]  oxyCODONE-acetaminophen (PERCOCET) 10-325 MG tablet Take 1 tablet by mouth every 4 (four) hours.    Yes [provider]  phenylephrine-shark liver oil-mineral oil-petrolatum (PREPARATION H) 0.25-3-14-71.9 % rectal ointment Place 1 application rectally 2 (two) times daily as needed for hemorrhoids.   Yes [provider]  Polyethylene Glycol 3350 (MIRALAX PO) Take 17 g by mouth at bedtime.    Yes [provider]  predniSONE (DELTASONE) 20 MG tablet Take 1.5 tablets (30 mg total) by mouth daily with breakfast. 09/13/20  Yes Ladell Pier, MD  sertraline (ZOLOFT) 100 MG tablet TAKE 2 TABLETS BY MOUTH AT  BEDTIME Patient taking differently: Take 200 mg by mouth every evening.  02/07/20  Yes Hoyt Koch, MD   prochlorperazine (COMPAZINE) 10 MG tablet Take 10 mg by mouth every 6 (six) hours as needed for nausea or vomiting.    [provider]    Current Facility-Administered Medications  Medication Dose Route Frequency Provider Last Rate Last Admin  . acetaminophen (TYLENOL) tablet 650 mg  650 mg Oral Q6H PRN Elwyn Reach, MD   650 mg at 09/29/20 1203   Or  . acetaminophen (TYLENOL) suppository 650 mg  650 mg Rectal Q6H PRN Elwyn Reach, MD      . alum & mag hydroxide-simeth (MAALOX/MYLANTA) 200-200-20 MG/5ML suspension 30 mL  30 mL Oral Q6H PRN Dessa Phi, DO   30 mL at 10/03/20 2131  . calcium carbonate (TUMS - dosed in mg elemental calcium) chewable tablet 200 mg of elemental calcium  1 tablet Oral Daily PRN Dessa Phi, DO      . cholecalciferol (VITAMIN D) tablet 1,000 Units  1,000 Units Oral Daily Elwyn Reach, MD   1,000 Units at 10/05/20 0932  . fentaNYL (SUBLIMAZE)  injection 25 mcg  25 mcg Intravenous Q2H PRN Dessa Phi, DO      . levothyroxine (SYNTHROID) tablet 75 mcg  75 mcg Oral QAC breakfast Elwyn Reach, MD   75 mcg at 10/05/20 0607  . LORazepam (ATIVAN) injection 1 mg  1 mg Intravenous Once Lang Snow, FNP      . methylPREDNISolone sodium succinate (SOLU-MEDROL) 125 mg/2 mL injection 62.5 mg  62.5 mg Intravenous Daily Dessa Phi, DO   62.5 mg at 10/05/20 0932  . ondansetron (ZOFRAN) tablet 4 mg  4 mg Oral Q6H PRN Elwyn Reach, MD   4 mg at 09/28/20 1828   Or  . ondansetron (ZOFRAN) injection 4 mg  4 mg Intravenous Q6H PRN Elwyn Reach, MD   4 mg at 10/01/20 2138  . ondansetron (ZOFRAN) tablet 8 mg  8 mg Oral Q8H PRN Gala Romney L, MD      . oxyCODONE (Oxy IR/ROXICODONE) immediate release tablet 10 mg  10 mg Oral Q4H Dessa Phi, DO   10 mg at 10/05/20 1728  . oxyCODONE (OXYCONTIN) 12 hr tablet 10 mg  10 mg Oral Q12H Dessa Phi, DO   10 mg at 10/05/20 0932  . pantoprazole (PROTONIX) EC tablet 40 mg  40 mg Oral BID AC  Dessa Phi, DO   40 mg at 10/05/20 1728  . phenylephrine-shark liver oil-mineral oil-petrolatum (PREPARATION H) rectal ointment 1 application  1 application Rectal BID PRN Gala Romney L, MD      . polyethylene glycol (MIRALAX / GLYCOLAX) packet 17 g  17 g Oral BID Florencia Reasons, MD   17 g at 10/04/20 2135  . pregabalin (LYRICA) capsule 75 mg  75 mg Oral BID Dessa Phi, DO   75 mg at 10/05/20 0932  . prochlorperazine (COMPAZINE) tablet 10 mg  10 mg Oral Q6H PRN Dessa Phi, DO      . Rivaroxaban (XARELTO) tablet 15 mg  15 mg Oral BID WC Adrian Saran, RPH   15 mg at 10/05/20 1728   Followed by  . [START ON 10/20/2020] rivaroxaban (XARELTO) tablet 20 mg  20 mg Oral Q supper Adrian Saran, [facility]      . senna-docusate (Senokot-S) tablet 1 tablet  1 tablet Oral BID Florencia Reasons, MD   1 tablet at 10/05/20 0932  . sertraline (ZOLOFT) tablet 200 mg  200 mg Oral QPM Gala Romney L, MD   200 mg at 10/05/20 1728    Allergies as of 09/27/2020 - Review Complete 09/27/2020  Allergen Reaction Noted  . Prozac [fluoxetine hcl] Anaphylaxis 04/09/2011  . Codeine Itching 04/09/2011  . Morphine and related Itching 04/09/2011  . Sulfa antibiotics Itching and Nausea Only 01/12/2013    Family History  Problem Relation Age of Onset  . Stomach cancer Father   . Liver cancer Father   . Diabetes Father   . Colon cancer Neg Hx     Social History   Socioeconomic History  . Marital status: Divorced    Spouse name: Not on file  . Number of children: 1  . Years of education: Not on file  . Highest education level: Not on file  Occupational History  . Occupation: retired  Tobacco Use  . Smoking status: Former Smoker    Packs/day: 2.00    Years: 34.00    Pack years: 68.00    Types: Cigarettes    Quit date: 12/15/1996    Years since quitting: 23.8  . Smokeless tobacco: Never Used  .  Tobacco comment: vapes x 2 years  Vaping Use  . Vaping Use: Every day  . Substances: Nicotine  Substance and  Sexual Activity  . Alcohol use: Not Currently    Alcohol/week: 0.0 standard drinks    Comment: Caffine Intake 2x's daily  . Drug use: No  . Sexual activity: Not on file  Other Topics Concern  . Not on file  Social History Narrative  . Not on file   Social Determinants of Health   Financial Resource Strain:   . Difficulty of Paying Living Expenses: Not on file  Food Insecurity:   . Worried About Charity fundraiser in the Last Year: Not on file  . Ran Out of Food in the Last Year: Not on file  Transportation Needs: No Transportation Needs  . Lack of Transportation (Medical): No  . Lack of Transportation (Non-Medical): No  Physical Activity:   . Days of Exercise per Week: Not on file  . Minutes of Exercise per Session: Not on file  Stress:   . Feeling of Stress : Not on file  Social Connections:   . Frequency of Communication with Friends and Family: Not on file  . Frequency of Social Gatherings with Friends and Family: Not on file  . Attends Religious Services: Not on file  . Active Member of Clubs or Organizations: Not on file  . Attends Archivist Meetings: Not on file  . Marital Status: Not on file  Intimate Partner Violence:   . Fear of Current or Ex-Partner: Not on file  . Emotionally Abused: Not on file  . Physically Abused: Not on file  . Sexually Abused: Not on file    Review of Systems: Pertinent positive and negative review of systems were noted in the above HPI section.  All other review of systems was otherwise negative.  Physical Exam: Vital signs in last 24 hours: Temp:  [98.5 F (36.9 C)-99 F (37.2 C)] 99 F (37.2 C) (10/15 1304) Pulse Rate:  [90-101] 90 (10/15 1304) Resp:  [18-20] 20 (10/15 1304) BP: (131-142)/(64-69) 131/64 (10/15 1304) SpO2:  [94 %-96 %] 94 % (10/15 1304) Last BM Date: 10/04/20 General:   Alert,  Well-developed, chronically ill-appearing older white female, pleasant and cooperative in NAD, on nasal O2 Head:   Normocephalic and atraumatic. Eyes:  Sclera clear, no icterus.   Conjunctiva pale Ears:  Normal auditory acuity. Nose:  No deformity, discharge,  or lesions. Mouth:  No deformity or lesions.   Neck:  Supple; no masses or thyromegaly. Lungs: Somewhat decreased breath sounds bilaterally  heart:  Regular rate and rhythm; no murmurs, clicks, rubs,  or gallops. Abdomen:  Soft, obese, she is quite tender in the left mid abdomen where there is a rounded fullness, BS active,nonpalp mass or hsm.   Rectal:  Deferred -documented heme positive today Msk:  Symmetrical without gross deformities. . Pulses:  Normal pulses noted. Extremities:  Without clubbing or edema. Neurologic:  Alert and  oriented x4; bilateral upper and lower extremity weakness able to move her feet  Skin:  Intact without significant lesions or rashes.. Psych:  Alert and cooperative. Normal mood and affect.  Intake/Output from previous day: 10/14 0701 - 10/15 0700 In: 320 [P.O.:320] Out: 701 [Urine:700; Stool:1] Intake/Output this shift: Total I/O In: 240 [P.O.:240] Out: 600 [Urine:600]  Lab Results: Recent Labs    10/03/20 0500 10/05/20 1141  WBC 8.3 10.0  HGB 7.8* 8.6*  HCT 23.1* 25.9*  PLT 142* 143*   BMET  Recent Labs    10/03/20 0500  NA 136  K 5.1  CL 98  CO2 28  GLUCOSE 124*  BUN 35*  CREATININE 0.78  CALCIUM 8.8*   LFT No results for input(s): PROT, ALBUMIN, AST, ALT, ALKPHOS, BILITOT, BILIDIR, IBILI in the last 72 hours. PT/INR No results for input(s): LABPROT, INR in the last 72 hours. Hepatitis Panel No results for input(s): HEPBSAG, HCVAB, HEPAIGM, HEPBIGM in the last 72 hours.   IMPRESSION:  #20 74 year old white female with history of squamous cell anal cancer diagnosed September 2020, who completed chemotherapy and radiation December 2020.  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 Patient had required steroid  therapy over the past few months, has had falls at home and with progressive weakness was admitted about a week ago. #2 new diagnosis of pulmonary embolus and DVT on admission, with right heart strain.  Now on Xarelto #3   5 g drop in hemoglobin over the past 1 week.  No overt GI bleed.  Patient has been noticing streaks of blood in her stool over the past multiple months, but had normal hemoglobin prior to admission. Unclear whether she has had subacute GI bleed over the past week. Also need to consider retroperitoneal bleed in setting of anticoagulation and/or intra-abdominal hematoma.  She has significant tenderness in the left mid abdomen on exam today.  Regarding her ongoing low-grade hematochezia, need to consider radiation proctitis, recurrent neoplasm, versus bleeding secondary to hemorrhoids. Also cannot rule out component of subacute blood loss secondary to gastropathy/ulcer disease.  Especially in setting of steroid use.   PLAN: We will schedule for CT of the abdomen pelvis with contrast Serial hemoglobins and transfuse for hemoglobin less than 7.5 Had discussed anticoagulation with hospitalist, tentative plan had been to switch her to IV heparin tomorrow in anticipation of possible endoscopic evaluation with colonoscopy and EGD either Sunday or Monday. Both procedures were discussed in detail with the patient including indications risks and benefits and she is agreeable to proceed. Cover with PPI daily.  Further recommendations pending findings of CT. Thank you will follow with you   Orest Dygert EsterwoodPA-C  10/05/2020, 5:37 PM

## 2020-10-05 NOTE — TOC Progression Note (Signed)
Transition of Care [facility]) - Progression Note    Patient Details  Name: Joanne Klein MRN: [id number] Date of Birth: 1946-02-16  [facility]) CM/SW Contact  Purcell Mouton, RN Phone Number: 10/05/2020, 10:28 AM  Clinical Narrative:     Insurance Josem Kaufmann [id number] 10/14-18. Will need rapid COVID test. Information given to SNF.   Expected Discharge Plan: [place] Barriers to Discharge: Continued Medical Work up  Expected Discharge Plan and Services Expected Discharge Plan: [place] In-house Referral: Clinical Social Work Discharge Planning Services: CM Consult Post Acute Care Choice: [place] Living arrangements for the past 2 months: Single Family Home                                       Social Determinants of Health (SDOH) Interventions    Readmission Risk Interventions Readmission Risk Prevention Plan 06/22/2020  Transportation Screening Complete  PCP or Specialist Appt within 5-7 Days Not Complete  Not Complete comments disposition pending  Home Care Screening Complete  Medication Review (RN CM) Referral to Pharmacy  Some recent data might be hidden

## 2020-10-05 NOTE — Progress Notes (Signed)
Physical Therapy Treatment Patient Details Name: Joanne Klein MRN: [id number] DOB: 1946-02-24 Today's Date: 10/05/2020    History of Present Illness 74 y.o. female with medical history significant of rectal cancer status post chemotherapy and radiation therapy who developed significant myopathy following treatment, and has significant lower extremity weakness, neuralgia, known degenerative disc disease of the lumbar spine, GERD, hypertension, fibromyalgia, overall weakness with poor mobility, morbid obesity as well as obstructive sleep apnea, hypertension, hypothyroidism, who presented to the hospital with worsening weakness as well as another fall.  Pt admitted with failure to thrive at home due to weakness and found to have PE    PT Comments    Pt continues to have significant LE weakness and reports numbness.  LEs require assist and repositioning especially in sitting.  Pt unable to stand and very poor ability to scoot without assist (along bed).  Continue to recommend SNF upon d/c.    Follow Up Recommendations  SNF     Equipment Recommendations  None recommended by PT    Recommendations for Other Services       Precautions / Restrictions Precautions Precautions: Fall Precaution Comments: very tender LEs    Mobility  Bed Mobility Overal bed mobility: Needs Assistance Bed Mobility: Rolling;Sidelying to Sit;Sit to Supine Rolling: Mod assist Sidelying to sit: Max assist   Sit to supine: +2 for physical assistance;Mod assist   General bed mobility comments: pt assisting with upper body using rails, pt performed rolling to change wet bed pad and then assisted to sitting, pt attempted to scoot up [facility] however very limited movement, assist required for bil LEs as pt has decreased strength and reports numbness  Transfers                    Ambulation/Gait                 Stairs             Wheelchair Mobility    Modified Rankin (Stroke Patients  Only)       Balance                                            Cognition Arousal/Alertness: Awake/alert Behavior During Therapy: WFL for tasks assessed/performed Overall Cognitive Status: Within Functional Limits for tasks assessed                                        Exercises General Exercises - Lower Extremity Long Arc Quad: AROM;Limitations;5 reps;Both;Seated Long Arc Quad Limitations: unable to perform full AROM Hip Flexion/Marching Limitations: unable to perform    General Comments        Pertinent Vitals/Pain Pain Assessment: 0-10 Pain Score: 6  Pain Location: bil LEs Pain Descriptors / Indicators: Tender;Sore Pain Intervention(s): Monitored during session;Repositioned    Home Living                      Prior Function            PT Goals (current goals can now be found in the care plan section) Progress towards PT goals: Progressing toward goals    Frequency    Min 2X/week      PT Plan Current plan remains appropriate    Co-evaluation  AM-PAC PT "6 Clicks" Mobility   Outcome Measure  Help needed turning from your back to your side while in a flat bed without using bedrails?: A Lot Help needed moving from lying on your back to sitting on the side of a flat bed without using bedrails?: A Lot Help needed moving to and from a bed to a chair (including a wheelchair)?: Total Help needed standing up from a chair using your arms (e.g., wheelchair or bedside chair)?: Total Help needed to walk in hospital room?: Total Help needed climbing 3-5 steps with a railing? : Total 6 Click Score: 8    End of Session   Activity Tolerance: Patient limited by fatigue Patient left: with call bell/phone within reach;in bed;with bed alarm set (heels floated)   PT Visit Diagnosis: Other abnormalities of gait and mobility (R26.89)     Time: 2878-6767 PT Time Calculation (min) (ACUTE ONLY): 22  min  Charges:  $Therapeutic Activity: 8-22 mins                    [name], DPT Acute Rehabilitation Services Pager: [phone] Office: [phone]  Tyashia Morrisette,KATHrine E 10/05/2020, 2:09 PM

## 2020-10-05 NOTE — Plan of Care (Signed)
  Problem: Clinical Measurements: Goal: Respiratory complications will improve Outcome: Progressing   

## 2020-10-05 NOTE — Progress Notes (Signed)
Brief Neuro Update:  Per notes, patient is refusing MRIs at this time. My suspicion for this coming from her spinal cord or potential radiation myonecrosis  is low and I think putting her under anesthesia for this is more risky than beneficial. As for the MRI of the femur which I was hoping can be done without putting her under general anesthesia, unfortunately, patient declined to attempt the MRI with some Ativan to calm her down. The primary goal behind getting MRI femur was to evaluate for any contrast enhancement or edema suggestive active myositis and help identify a site for potential muscle biopsy. Biopsy of a fibrotic/atrophied muscle would essentially be useless. I think we can use a needle EMG to identify an appropriate site/muscle to biopsy. I therefore think that we can hold off on MRIs at this time.  I would recommend having patient closely follow with either Dr. Marcial Pacas with [facility] or with Dr. Narda Amber with [facility].  [place] Pager Number [phone]

## 2020-10-06 ENCOUNTER — Inpatient Hospital Stay (HOSPITAL_COMMUNITY): Payer: Medicare Other

## 2020-10-06 DIAGNOSIS — J9611 Chronic respiratory failure with hypoxia: Secondary | ICD-10-CM | POA: Diagnosis not present

## 2020-10-06 DIAGNOSIS — R195 Other fecal abnormalities: Secondary | ICD-10-CM | POA: Diagnosis not present

## 2020-10-06 DIAGNOSIS — C210 Malignant neoplasm of anus, unspecified: Secondary | ICD-10-CM | POA: Diagnosis not present

## 2020-10-06 DIAGNOSIS — Z85048 Personal history of other malignant neoplasm of rectum, rectosigmoid junction, and anus: Secondary | ICD-10-CM | POA: Diagnosis not present

## 2020-10-06 DIAGNOSIS — Z86711 Personal history of pulmonary embolism: Secondary | ICD-10-CM | POA: Diagnosis not present

## 2020-10-06 DIAGNOSIS — I2609 Other pulmonary embolism with acute cor pulmonale: Secondary | ICD-10-CM | POA: Diagnosis not present

## 2020-10-06 DIAGNOSIS — D62 Acute posthemorrhagic anemia: Secondary | ICD-10-CM | POA: Diagnosis not present

## 2020-10-06 DIAGNOSIS — R296 Repeated falls: Secondary | ICD-10-CM | POA: Diagnosis not present

## 2020-10-06 LAB — HEMOGLOBIN AND HEMATOCRIT, BLOOD
HCT: 24.1 % — ABNORMAL LOW (ref 36.0–46.0)
HCT: 26.1 % — ABNORMAL LOW (ref 36.0–46.0)
Hemoglobin: 7.8 g/dL — ABNORMAL LOW (ref 12.0–15.0)
Hemoglobin: 8.7 g/dL — ABNORMAL LOW (ref 12.0–15.0)

## 2020-10-06 MED ORDER — IOHEXOL 300 MG/ML  SOLN
100.0000 mL | Freq: Once | INTRAMUSCULAR | Status: AC | PRN
  Administered 2020-10-06: 100 mL via INTRAVENOUS

## 2020-10-06 MED ORDER — IOHEXOL 9 MG/ML PO SOLN
ORAL | Status: AC
  Administered 2020-10-06: 500 mL via ORAL
  Filled 2020-10-06: qty 500

## 2020-10-06 MED ORDER — VITAMIN B-12 1000 MCG PO TABS: 1000.0000 ug | ORAL_TABLET | Freq: Every day | ORAL | Status: DC

## 2020-10-06 MED ORDER — FOLIC ACID 1 MG PO TABS
1.0000 mg | ORAL_TABLET | Freq: Every day | ORAL | Status: DC
  Administered 2020-10-06 – 2020-10-10 (×5): 1 mg via ORAL
  Filled 2020-10-06 (×5): qty 1

## 2020-10-06 MED ORDER — PEG-KCL-NACL-NASULF-NA ASC-C 100 G PO SOLR
0.5000 | Freq: Once | ORAL | Status: AC
  Administered 2020-10-07: 100 g via ORAL
  Filled 2020-10-06: qty 1

## 2020-10-06 MED ORDER — HEPARIN (PORCINE) 25000 UT/250ML-% IV SOLN
1400.0000 [IU]/h | INTRAVENOUS | Status: DC
  Administered 2020-10-06: 1200 [IU]/h via INTRAVENOUS
  Administered 2020-10-07: 1400 [IU]/h via INTRAVENOUS
  Filled 2020-10-06 (×2): qty 250

## 2020-10-06 MED ORDER — BISACODYL 5 MG PO TBEC
20.0000 mg | DELAYED_RELEASE_TABLET | Freq: Once | ORAL | Status: AC
  Administered 2020-10-06: 20 mg via ORAL
  Filled 2020-10-06: qty 4

## 2020-10-06 MED ORDER — CYANOCOBALAMIN 1000 MCG/ML IJ SOLN
1000.0000 ug | Freq: Every day | INTRAMUSCULAR | Status: DC
  Administered 2020-10-06 – 2020-10-10 (×5): 1000 ug via INTRAMUSCULAR
  Filled 2020-10-06 (×5): qty 1

## 2020-10-06 MED ORDER — PEG-KCL-NACL-NASULF-NA ASC-C 100 G PO SOLR: 1.0000 | Freq: Once | ORAL | Status: DC

## 2020-10-06 MED ORDER — PEG-KCL-NACL-NASULF-NA ASC-C 100 G PO SOLR
0.5000 | Freq: Once | ORAL | Status: AC
  Administered 2020-10-06: 100 g via ORAL
  Filled 2020-10-06: qty 1

## 2020-10-06 MED ORDER — IOHEXOL 9 MG/ML PO SOLN
500.0000 mL | ORAL | Status: AC
  Administered 2020-10-06: 500 mL via ORAL

## 2020-10-06 NOTE — Progress Notes (Signed)
Patient is refusing bowel prep for EGD/colonoscopy, will make MD aware

## 2020-10-06 NOTE — Progress Notes (Signed)
[place] for Heparin Indication: pulmonary embolus and DVT  Allergies  Allergen Reactions  . Prozac [Fluoxetine Hcl] Anaphylaxis  . Codeine Itching  . Morphine And Related Itching  . Sulfa Antibiotics Itching and Nausea Only    Patient Measurements: Height: 5\' 6"  (167.6 cm) Weight: 99.8 kg (220 lb) IBW/kg (Calculated) : 59.3 Heparin Dosing Weight: 82 kg  Vital Signs: Temp: 97.8 F (36.6 C) (10/16 0641) Temp Source: Oral (10/16 0641) BP: 129/63 (10/16 0641) Pulse Rate: 93 (10/16 0641)  Labs: Recent Labs    10/05/20 1141 10/05/20 1141 10/05/20 1823 10/06/20 0613  HGB 8.6*   < > 8.5* 7.8*  HCT 25.9*  --  25.8* 24.1*  PLT 143*  --   --   --    < > = values in this interval not displayed.    Estimated Creatinine Clearance: 75.8 mL/min (by C-G formula based on SCr of 0.78 mg/dL).   Medical History: Past Medical History:  Diagnosis Date  . Allergic rhinitis   . Arthritis   . Bursitis    Both Shoulders  . Chronic low back pain   . Degenerative disk disease   . Depression   . Diverticulitis   . Emphysema of lung ([place])   . Fibromyalgia   . GERD (gastroesophageal reflux disease)   . History of fibromyalgia   . Hypertension   . Hypothyroidism   . Irritable bowel syndrome   . Neuralgia, post-herpetic   . Obesity, unspecified   . Obstructive sleep apnea   . OCD (obsessive compulsive disorder)   . Pain in joint, site unspecified   . Pure hypercholesterolemia   . Scoliosis   . Situational stress   . Spastic colon   . Tubular adenoma   . Uterine cancer ([facility])     Medications:  Xarelto 15 mg BID >> 20 mg bid initiated, completed 7.5 days transition to IV- last dose this AM at 0900  Assessment: 74 y/o F with a h/o anal cancer with PE and DVT diagnosed this admission. GI evaluating for 5 point drop in hemoglobin possibly from GI source vs RPB. FOB+ and has had rectal bleeding. CT abdomen/pelvis ordered and patient may  require endoscopy and colonoscopy. Pharmacy consulted to transition to IV UFH from Xarelto.    Goal of Therapy:  Heparin level 0.3-0.7 units/ml (possible GIB/RPB, target low end of goal range and avoid boluses if possible) APTT 66-102 (possible GIB/RPB, target low end of goal range and avoid boluses if possible) Monitor platelets by anticoagulation protocol: Yes   Plan:  Initiate heparin infusion at 1200 units/hr without bolus at 2100.  Will proceed with conservative dosing and avoid boluses keeping in mind that patient has hemodynamically significant PE APTT in 8 hours.  Daily HL/CBC  Ulice Dash D 10/06/2020,12:51 PM

## 2020-10-06 NOTE — Progress Notes (Signed)
° ° °  Progress Note   Subjective  No new complaint today She drank the oral contrast for abdominal and pelvic CT which is pending   Objective  Vital signs in last 24 hours: Temp:  [97.8 F (36.6 C)-98.1 F (36.7 C)] 98.1 F (36.7 C) (10/16 1332) Pulse Rate:  [93-98] 98 (10/16 1332) Resp:  [14-18] 17 (10/16 1332) BP: (129-146)/(63-84) 146/84 (10/16 1332) SpO2:  [92 %-99 %] 92 % (10/16 1332) Last BM Date: 10/05/20 (per report )  Gen: awake, alert, NAD, chronically ill-appearing HEENT: anicteric, op clear CV: Mild tachycardia but regular Pulm: CTA b/l Abd: soft, obese, diffusely tender without rebound or guarding, bowel sounds are present.   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  Acute anemia/hx anal cancer/recent DVT and PE --concern for blood loss anemia.  CT abdomen pelvis pending and being performed very shortly.  If unremarkable, the next step in evaluation would be upper endoscopy and colonoscopy.  We discussed this today.  After discussing the risk, benefits and alternatives she is agreeable and wishes to proceed.  We will have to perform bowel prep with her in the hospital bed given limited mobility.  This may take more than 1 day to accomplish but will schedule colonoscopy and upper endoscopy  tentatively for tomorrow --Follow-up CT scan --Upper endoscopy and colonoscopy perhaps tomorrow after bowel preparation as tolerated --Hemoglobin down slightly further today, monitor closely, transfusion threshold 7.0 --She is being placed on a heparin infusion this afternoon with last dose of Xarelto being yesterday.  Would need to hold heparin infusion 6 hours prior to endoscopy.  Plan to resume as soon as possible thereafter given recent DVT and PE      LOS: 9 days   Jerene Bears  10/06/2020, 1:41 PM

## 2020-10-06 NOTE — Progress Notes (Signed)
PROGRESS NOTE    Joanne Klein  NWG:[id number] DOB: 03/10/1946 DOA: 09/27/2020 PCP: Hoyt Koch, MD    Chief Complaint  Patient presents with  . Fall    Brief Narrative:  Joanne Klein a 74 y.o.femalewith medical history significant ofrectal cancer status post chemotherapy and radiation therapy who developed significant myopathy following treatment, and has significant lower extremity weakness, neuralgia, known degenerative disc disease of the lumbar spine, GERD, hypertension, fibromyalgia, overall weakness with poor mobility, morbid obesity as well as obstructive sleep apnea, hypertension, hypothyroidism, who presented to the hospital with worsening weakness as well as another fall. She was evaluated and was being admitted with failure to thrive at home due to weakness with plan for possible PT OT and placement. While being evaluated, patient was noted to be hypoxic and also tachycardic. Work-up with CT angiogram of the chest showed pulmonary embolism. Patient has been placed on oxygen and is being treated and admitted for pulmonary embolism in addition to her chronic problems. Hospitalization further complicated by severe bilateral knee pain, continued weakness. She is recommended to go to SNF on discharge.   Subjective:  Reports able to move  more, today she is able to lift leg off the bed slightly , she denies pain   Hemoglobin dropped, no overt bleeding, she is getting ready to have CT abdomen  She is on 2liter oxygen supplement, she denies sob  Assessment & Plan:   Principal Problem:   Pulmonary embolism ([place]) Active Problems:   Depression   Hypothyroidism   GERD (gastroesophageal reflux disease)   COPD GOLD II    Hyperlipidemia   Morbid obesity ([place])   Anal cancer ([place])   Chronic respiratory failure with hypoxia ([facility])   Weakness of both legs   Falls frequently   Heme positive stool   Acute blood loss anemia   History of anal cancer   History of  pulmonary embolism   PE and DVT -She presented with fall and weakness, found to have PE and DVT on presentation -Venous Doppler ultrasound revealed indeterminant DVT of the right lower extremity -Echo without heart strain -DVT and PE due to immobility, need at least 3 mo anticoagulation treatment -She is started on-Xarelto , hold Xarelto on October 16, transition to heparin drip in anticipating endoscope for anemia work-up   Acute hypoxemic respiratory failure, baseline not home o2 dependent ( reports was on home o2 briefly when she received radiation treatment in the past ) -oxygen sat 88% on room air on presentation , likely due to PE, she does has h/o COPD but there is no wheezing --Continue nasal cannula O2 and wean as able  macrocytic anemia: b12/folate low normal, start on supplement hgb slowing trending down, she does has left leg skin breakdown from fall prior to admission Wound oozing minimal blood  FOBT + , reports brown stool with streaks of blood, reports h/o hemorrhoid Reports h/o gastric ulcer 58yrs ago, denies epigastric pain Case discussed with oncology due to her history of anal cancer , per oncology Dr. Lorenso Courier anal cancer is considered treated, he recommended GI consult  GI consulted, proceed with CT abdomen done likely colonoscopy, hold Xarelto, transition to heparin drip, appreciate GI input  Addendum: Initial planned to proceed with colonoscopy tomorrow, however patient does not want to drink the prep today due to burping and indigestion, she states will try to drink the prep tomorrow, GI Dr. [name] made aware.  Lower extremity weakness -She was hospitalized for the same in  July, had MRI of spine showed myositis, CK unremarkable -per patient she improved with steroid treatment however weakness got worse when steroid dose decreased in the last few weeks --EDP discussed with [name] with neurology who did not feel that MRI would provide more information.  It was  felt on admission that her condition is likely just worsened from being deconditioned. They had no other recommendations to offer per EDP discussion with neurology [name]. -- neurology reconsulted on October 14, neurology initially recommended MRI of the thigh however patient's not able to tolerate , case discussed with neurology again who recommend outpatient follow-up with neuromuscular specialist [name] for EMG and muscle biopsy .message sent to Dr. Krista Blue. -She is started back on Solu-Medrol from October 10, weakness appear improving on steroid  Hyperesthesia -Reports significant pain bilateral lower extremity with light touch -Lyrica started October 11, appear improving  Bilateral knee pain: -Bilateral knee x-ray showed degenerative changes no acute findings -Due to her continued musculoskeletal pain as well as joint pain,  --CRP and sed rate mildly elevated  -ANA, RF, CCP negative. Aldolase negative -Continue pain control, IV steroids, PT OT  -Added lyrica for any neuropathic component. Also scheduled oxycodone as patient has had trouble getting medication on time prn   Left leg wound after fall Wound does not appear infected, continue daily dressing changes  Fracture of Left greater trochanter of the left proximal femur, incidental finding on CT abdomen obtained on October 16 -patient does not appear to have hip pain, she does have a lot of  Bilateral lower leg pain - discussed  with ortho Dr. French Ana who advised  this is likely not surgical, advised get left hip x-ray which is ordered  H/o multiple falls: 5/31 fell right ankle injury required brace by Dr Ronnie Derby 6/14 fell  7/6 fell  8/8 forward on both knees , left hip evaluated by in the ED , "Left hip avulsion fracture noted. Patient was able to ambulate using a walker as she does at home"  8/25 fell in bathroom  9/10 fell going out of the back door while using a walker, she took one step but right knee give out then both  keen go down together  10/7 fell when trying to change diaper , when right knee give out, admitted to the hospital  History of rectal cancer -Follows with Dr. Benay Spice  Hypothyroidism -Continue Synthroid  GERD -Continue Protonix  Depression -Continue Zoloft  Hyperlipidemia -Continue Lipitor  Obesity Body mass index is 35.51 kg/m.     DVT prophylaxis: Currently on heparin drip   Code Status: Full Family Communication: daughter over the phone Disposition:   Status is: Inpatient  Dispo: The patient is from: Home              Anticipated d/c is to: Skilled nursing facility              Anticipated d/c date is: To be determined, will need colonoscopy                Consultants:   Neurology  Oncology  ortho  Procedures:   None  Antimicrobials:   None     Objective: Vitals:   10/05/20 1304 10/05/20 2134 10/06/20 0641 10/06/20 1332  BP: 131/64 (!) 144/83 129/63 (!) 146/84  Pulse: 90 94 93 98  Resp: 20 18 14 17   Temp: 99 F (37.2 C) 98.1 F (36.7 C) 97.8 F (36.6 C) 98.1 F (36.7 C)  TempSrc: Oral Oral  Oral Oral  SpO2: 94% 99% 97% 92%  Weight:      Height:        Intake/Output Summary (Last 24 hours) at 10/06/2020 1337 Last data filed at 10/06/2020 1100 Gross per 24 hour  Intake 730 ml  Output 750 ml  Net -20 ml   Filed Weights   09/28/20 0848  Weight: 99.8 kg    Examination:  General exam: Chronically ill-appearing Respiratory system: Diminished at bases, no rales, no rhonchi, no wheezing, respiratory effort normal. Cardiovascular system: S1 & S2 heard, RRR. No JVD, no murmur, No pedal edema. Gastrointestinal system: Abdomen is nondistended, soft and nontender.  Normal bowel sounds heard. Central nervous system: Alert and oriented. No focal neurological deficits. Extremities: Not able to lift bilateral lower extremity against gravity Skin: Left lower leg with dressing unwrapped round with gauze, left leg wound pic as shown  below, does not appear infected Psychiatry: Judgement and insight appear normal. Mood & affect appropriate.       Data Reviewed: I have personally reviewed following labs and imaging studies  CBC: Recent Labs  Lab 09/30/20 0535 09/30/20 0535 10/01/20 0509 10/01/20 0509 10/02/20 0510 10/03/20 0500 10/05/20 1141 10/05/20 1823 10/06/20 0613  WBC 8.4  --  9.3  --  7.8 8.3 10.0  --   --   HGB 9.6*   < > 8.8*   < > 8.0* 7.8* 8.6* 8.5* 7.8*  HCT 28.6*   < > 25.8*   < > 23.5* 23.1* 25.9* 25.8* 24.1*  MCV 100.0  --  98.1  --  98.7 100.9* 100.4*  --   --   PLT 133*  --  135*  --  125* 142* 143*  --   --    < > = values in this interval not displayed.    Basic Metabolic Panel: Recent Labs  Lab 10/03/20 0500  NA 136  K 5.1  CL 98  CO2 28  GLUCOSE 124*  BUN 35*  CREATININE 0.78  CALCIUM 8.8*    GFR: Estimated Creatinine Clearance: 75.8 mL/min (by C-G formula based on SCr of 0.78 mg/dL).  Liver Function Tests: No results for input(s): AST, ALT, ALKPHOS, BILITOT, PROT, ALBUMIN in the last 168 hours.  CBG: No results for input(s): GLUCAP in the last 168 hours.   Recent Results (from the past 240 hour(s))  Respiratory Panel by RT PCR (Flu A&B, Covid) - Nasopharyngeal Swab     Status: None   Collection Time: 09/27/20  8:33 PM   Specimen: Nasopharyngeal Swab  Result Value Ref Range Status   SARS Coronavirus 2 by RT PCR NEGATIVE NEGATIVE Final    Comment: (NOTE) SARS-CoV-2 target nucleic acids are NOT DETECTED.  The SARS-CoV-2 RNA is generally detectable in upper respiratoy specimens during the acute phase of infection. The lowest concentration of SARS-CoV-2 viral copies this assay can detect is 131 copies/mL. A negative result does not preclude SARS-Cov-2 infection and should not be used as the sole basis for treatment or other patient management decisions. A negative result may occur with  improper specimen collection/handling, submission of specimen other than  nasopharyngeal swab, presence of viral mutation(s) within the areas targeted by this assay, and inadequate number of viral copies (<131 copies/mL). A negative result must be combined with clinical observations, patient history, and epidemiological information. The expected result is Negative.  Fact Sheet for Patients:  PinkCheek.be  Fact Sheet for Healthcare Providers:  GravelBags.it  This test is no t yet approved or cleared  by the [place] and  has been authorized for detection and/or diagnosis of SARS-CoV-2 by FDA under an Emergency Use Authorization (EUA). This EUA will remain  in effect (meaning this test can be used) for the duration of the COVID-19 declaration under Section 564(b)(1) of the Act, 21 U.S.C. section 360bbb-3(b)(1), unless the authorization is terminated or revoked sooner.     Influenza A by PCR NEGATIVE NEGATIVE Final   Influenza B by PCR NEGATIVE NEGATIVE Final    Comment: (NOTE) The Xpert Xpress SARS-CoV-2/FLU/RSV assay is intended as an aid in  the diagnosis of influenza from Nasopharyngeal swab specimens and  should not be used as a sole basis for treatment. Nasal washings and  aspirates are unacceptable for Xpert Xpress SARS-CoV-2/FLU/RSV  testing.  Fact Sheet for Patients: PinkCheek.be  Fact Sheet for Healthcare Providers: GravelBags.it  This test is not yet approved or cleared by the [place] FDA and  has been authorized for detection and/or diagnosis of SARS-CoV-2 by  FDA under an Emergency Use Authorization (EUA). This EUA will remain  in effect (meaning this test can be used) for the duration of the  Covid-19 declaration under Section 564(b)(1) of the Act, 21  U.S.C. section 360bbb-3(b)(1), unless the authorization is  terminated or revoked. Performed at [facility], [place] [address] [place], [place] [address]   SARS Coronavirus 2 by RT PCR (hospital order, performed in [facility] hospital lab) Nasopharyngeal Nasopharyngeal Swab     Status: None   Collection Time: 10/05/20  2:35 PM   Specimen: Nasopharyngeal Swab  Result Value Ref Range Status   SARS Coronavirus 2 NEGATIVE NEGATIVE Final    Comment: (NOTE) SARS-CoV-2 target nucleic acids are NOT DETECTED.  The SARS-CoV-2 RNA is generally detectable in upper and lower respiratory specimens during the acute phase of infection. The lowest concentration of SARS-CoV-2 viral copies this assay can detect is 250 copies / mL. A negative result does not preclude SARS-CoV-2 infection and should not be used as the sole basis for treatment or other patient management decisions.  A negative result may occur with improper specimen collection / handling, submission of specimen other than nasopharyngeal swab, presence of viral mutation(s) within the areas targeted by this assay, and inadequate number of viral copies (<250 copies / mL). A negative result must be combined with clinical observations, patient history, and epidemiological information.  Fact Sheet for Patients:   StrictlyIdeas.no  Fact Sheet for Healthcare Providers: BankingDealers.co.za  This test is not yet approved or  cleared by the [place] FDA and has been authorized for detection and/or diagnosis of SARS-CoV-2 by FDA under an Emergency Use Authorization (EUA).  This EUA will remain in effect (meaning this test can be used) for the duration of the COVID-19 declaration under Section 564(b)(1) of the Act, 21 U.S.C. section 360bbb-3(b)(1), unless the authorization is terminated or revoked sooner.  Performed at [facility], [place] [address], [place], [place] [address]          Radiology Studies: No results found.      Scheduled Meds: . cholecalciferol  1,000 Units Oral Daily   . cyanocobalamin  1,000 mcg Intramuscular Daily   Followed by  . [START ON 10/13/2020] vitamin B-12  1,000 mcg Oral Daily  . folic acid  1 mg Oral Daily  . levothyroxine  75 mcg Oral QAC breakfast  . LORazepam  1 mg Intravenous Once  . methylPREDNISolone (SOLU-MEDROL) injection  62.5 mg Intravenous Daily  .  oxyCODONE  10 mg Oral Q4H  . oxyCODONE  10 mg Oral Q12H  . pantoprazole  40 mg Oral BID AC  . polyethylene glycol  17 g Oral BID  . pregabalin  75 mg Oral BID  . senna-docusate  1 tablet Oral BID  . sertraline  200 mg Oral QPM   Continuous Infusions: . heparin       LOS: 9 days   Time spent: 44mins, case discussed with GI and ortho Greater than 50% of this time was spent in counseling, explanation of diagnosis, planning of further management, and coordination of care.  I have personally reviewed and interpreted on  10/06/2020 daily labs, tele strips, I reviewed all nursing notes, pharmacy notes, consultant notes,  vitals, pertinent old records  I have discussed plan of care as described above with RN , patient and family on 10/06/2020  Voice Recognition /Dragon dictation system was used to create this note, attempts have been made to correct errors. Please contact the author with questions and/or clarifications.   Florencia Reasons, MD PhD FACP [facility]  Available via Epic secure chat 7am-7pm for nonurgent issues Please page for urgent issues To page the attending provider between 7A-7P or the covering provider during after hours 7P-7A, please log into the web site [email or web address] and access using universal [place] password for that web site. If you do not have the password, please call the hospital operator.    10/06/2020, 1:37 PM

## 2020-10-06 NOTE — Progress Notes (Signed)
Brief Neuro Update:  Vit B12 levels were low normal. Folate was also low normal. Given low MCV on CBC, will do Vit B12 and folate replacement.  - neurology inpatient team will signoff. Please feel free to contact us with any questions. Please see my previous notes for full recommendations.  [place] Pager Number [phone]

## 2020-10-07 DIAGNOSIS — D62 Acute posthemorrhagic anemia: Secondary | ICD-10-CM | POA: Diagnosis not present

## 2020-10-07 DIAGNOSIS — R296 Repeated falls: Secondary | ICD-10-CM | POA: Diagnosis not present

## 2020-10-07 DIAGNOSIS — Z86711 Personal history of pulmonary embolism: Secondary | ICD-10-CM | POA: Diagnosis not present

## 2020-10-07 DIAGNOSIS — I2609 Other pulmonary embolism with acute cor pulmonale: Secondary | ICD-10-CM | POA: Diagnosis not present

## 2020-10-07 DIAGNOSIS — C210 Malignant neoplasm of anus, unspecified: Secondary | ICD-10-CM | POA: Diagnosis not present

## 2020-10-07 DIAGNOSIS — R195 Other fecal abnormalities: Secondary | ICD-10-CM | POA: Diagnosis not present

## 2020-10-07 DIAGNOSIS — J9611 Chronic respiratory failure with hypoxia: Secondary | ICD-10-CM | POA: Diagnosis not present

## 2020-10-07 DIAGNOSIS — Z85048 Personal history of other malignant neoplasm of rectum, rectosigmoid junction, and anus: Secondary | ICD-10-CM | POA: Diagnosis not present

## 2020-10-07 LAB — CBC
HCT: 25.2 % — ABNORMAL LOW (ref 36.0–46.0)
Hemoglobin: 8.2 g/dL — ABNORMAL LOW (ref 12.0–15.0)
MCH: 33.2 pg (ref 26.0–34.0)
MCHC: 32.5 g/dL (ref 30.0–36.0)
MCV: 102 fL — ABNORMAL HIGH (ref 80.0–100.0)
Platelets: 137 10*3/uL — ABNORMAL LOW (ref 150–400)
RBC: 2.47 MIL/uL — ABNORMAL LOW (ref 3.87–5.11)
RDW: 14.5 % (ref 11.5–15.5)
WBC: 7.3 10*3/uL (ref 4.0–10.5)
nRBC: 0.8 % — ABNORMAL HIGH (ref 0.0–0.2)

## 2020-10-07 LAB — COMPREHENSIVE METABOLIC PANEL
ALT: 28 U/L (ref 0–44)
AST: 16 U/L (ref 15–41)
Albumin: 3 g/dL — ABNORMAL LOW (ref 3.5–5.0)
Alkaline Phosphatase: 62 U/L (ref 38–126)
Anion gap: 11 (ref 5–15)
BUN: 28 mg/dL — ABNORMAL HIGH (ref 8–23)
CO2: 34 mmol/L — ABNORMAL HIGH (ref 22–32)
Calcium: 9.4 mg/dL (ref 8.9–10.3)
Chloride: 95 mmol/L — ABNORMAL LOW (ref 98–111)
Creatinine, Ser: 0.86 mg/dL (ref 0.44–1.00)
GFR, Estimated: >60 mL/min (ref >60)
Glucose, Bld: 109 mg/dL — ABNORMAL HIGH (ref 70–99)
Potassium: 4.8 mmol/L (ref 3.5–5.1)
Sodium: 140 mmol/L (ref 135–145)
Total Bilirubin: 1.4 mg/dL — ABNORMAL HIGH (ref 0.3–1.2)
Total Protein: 5.9 g/dL — ABNORMAL LOW (ref 6.5–8.1)

## 2020-10-07 LAB — APTT
aPTT: 56 seconds — ABNORMAL HIGH (ref 24–36)
aPTT: 87 seconds — ABNORMAL HIGH (ref 24–36)
aPTT: 136 seconds — ABNORMAL HIGH (ref 24–36)

## 2020-10-07 LAB — HEPARIN LEVEL (UNFRACTIONATED): Heparin Unfractionated: >2.2 IU/mL — ABNORMAL HIGH (ref 0.30–0.70)

## 2020-10-07 LAB — HEMOGLOBIN AND HEMATOCRIT, BLOOD
HCT: 24.3 % — ABNORMAL LOW (ref 36.0–46.0)
HCT: 26 % — ABNORMAL LOW (ref 36.0–46.0)
Hemoglobin: 8 g/dL — ABNORMAL LOW (ref 12.0–15.0)
Hemoglobin: 8.6 g/dL — ABNORMAL LOW (ref 12.0–15.0)

## 2020-10-07 MED ORDER — HEPARIN (PORCINE) 25000 UT/250ML-% IV SOLN: 1200.0000 [IU]/h | INTRAVENOUS | Status: AC

## 2020-10-07 MED ORDER — SODIUM CHLORIDE 0.9 % IV SOLN
1.0000 g | INTRAVENOUS | Status: DC
  Administered 2020-10-07 – 2020-10-10 (×4): 1 g via INTRAVENOUS
  Filled 2020-10-07 (×3): qty 1
  Filled 2020-10-07: qty 0.97

## 2020-10-07 NOTE — Progress Notes (Signed)
Pt  instructed to start drinking the first part of Movie prep for 2 hours. 2nd part of Movie prep will be at 2200 tonight.

## 2020-10-07 NOTE — Progress Notes (Signed)
[place] for Heparin Indication: pulmonary embolus and DVT  Allergies  Allergen Reactions  . Prozac [Fluoxetine Hcl] Anaphylaxis  . Codeine Itching  . Morphine And Related Itching  . Sulfa Antibiotics Itching and Nausea Only    Patient Measurements: Height: 5\' 6"  (167.6 cm) Weight: 99.8 kg (220 lb) IBW/kg (Calculated) : 59.3 Heparin Dosing Weight: 82 kg  Vital Signs: Temp: 98.2 F (36.8 C) (10/17 0454) Temp Source: Oral (10/16 2137) BP: 133/73 (10/17 0454) Pulse Rate: 94 (10/17 0454)  Labs: Recent Labs    10/05/20 1141 10/05/20 1823 10/06/20 0613 10/06/20 0613 10/06/20 1854 10/07/20 0512  HGB 8.6*   < > 7.8*   < > 8.7* 8.2*  HCT 25.9*   < > 24.1*  --  26.1* 25.2*  PLT 143*  --   --   --   --  137*  APTT  --   --   --   --   --  56*  CREATININE  --   --   --   --   --  0.86   < > = values in this interval not displayed.    Estimated Creatinine Clearance: 70.5 mL/min (by C-G formula based on SCr of 0.86 mg/dL).   Medical History: Past Medical History:  Diagnosis Date  . Allergic rhinitis   . Arthritis   . Bursitis    Both Shoulders  . Chronic low back pain   . Degenerative disk disease   . Depression   . Diverticulitis   . Emphysema of lung ([place])   . Fibromyalgia   . GERD (gastroesophageal reflux disease)   . History of fibromyalgia   . Hypertension   . Hypothyroidism   . Irritable bowel syndrome   . Neuralgia, post-herpetic   . Obesity, unspecified   . Obstructive sleep apnea   . OCD (obsessive compulsive disorder)   . Pain in joint, site unspecified   . Pure hypercholesterolemia   . Scoliosis   . Situational stress   . Spastic colon   . Tubular adenoma   . Uterine cancer ([facility])     Medications:  Xarelto 15 mg BID >> 20 mg bid initiated, completed 7.5 days transition to IV- last dose this AM at 0900  Assessment: 74 y/o F with a h/o anal cancer with PE and DVT diagnosed this admission. GI evaluating  for 5 point drop in hemoglobin possibly from GI source vs RPB. FOB+ and has had rectal bleeding. CT abdomen/pelvis ordered and patient may require endoscopy and colonoscopy. Pharmacy consulted to transition to IV UFH from Xarelto.    10/07/2020:  APTT 56 sec- below goal range.    Heparin level - >2.2 falsely elevated as expected due to DOAC interaction.  Will continue to use aptt for heparin monitoring until levels correlate  CBC: Hg 8.2/pltc 137- low but relatively stable since admit  Blood in stool noted by RN- no change since admit, no other bleeding noted  No interruptions in heparin therapy  Patient did not tolerate bowel prep so procedure for today canceled; plan to re-attempt prep today  Goal of Therapy:  Heparin level 0.3-0.7 units/ml (possible GIB/RPB, target low end of goal range and avoid boluses if possible) APTT 66-102 (possible GIB/RPB, target low end of goal range and avoid boluses if possible) Monitor platelets by anticoagulation protocol: Yes   Plan:  Increase heparin infusion to 1400 units/hr  Will proceed with conservative dosing and avoid boluses keeping in mind that patient  has hemodynamically significant PE APTT in 8 hours.  Daily HL/CBC  Netta Cedars PharmD, BCPS 10/07/2020,5:58 AM

## 2020-10-07 NOTE — Progress Notes (Signed)
[place] for Heparin Indication: pulmonary embolus and DVT  Allergies  Allergen Reactions  . Prozac [Fluoxetine Hcl] Anaphylaxis  . Codeine Itching  . Morphine And Related Itching  . Sulfa Antibiotics Itching and Nausea Only    Patient Measurements: Height: 5\' 6"  (167.6 cm) Weight: 99.8 kg (220 lb) IBW/kg (Calculated) : 59.3 Heparin Dosing Weight: 82 kg  Vital Signs: Temp: 98.2 F (36.8 C) (10/17 1228) Temp Source: Oral (10/17 1228) BP: 122/69 (10/17 1228) Pulse Rate: 91 (10/17 1228)  Labs: Recent Labs    10/05/20 1141 10/05/20 1823 10/06/20 1854 10/06/20 1854 10/07/20 0512 10/07/20 0831 10/07/20 1402  HGB 8.6*   < > 8.7*   < > 8.2* 8.0*  --   HCT 25.9*   < > 26.1*  --  25.2* 24.3*  --   PLT 143*  --   --   --  137*  --   --   APTT  --   --   --   --  56*  --  87*  HEPARINUNFRC  --   --   --   --  >2.20*  --   --   CREATININE  --   --   --   --  0.86  --   --    < > = values in this interval not displayed.    Estimated Creatinine Clearance: 70.5 mL/min (by C-G formula based on SCr of 0.86 mg/dL).   Medical History: Past Medical History:  Diagnosis Date  . Allergic rhinitis   . Arthritis   . Bursitis    Both Shoulders  . Chronic low back pain   . Degenerative disk disease   . Depression   . Diverticulitis   . Emphysema of lung ([place])   . Fibromyalgia   . GERD (gastroesophageal reflux disease)   . History of fibromyalgia   . Hypertension   . Hypothyroidism   . Irritable bowel syndrome   . Neuralgia, post-herpetic   . Obesity, unspecified   . Obstructive sleep apnea   . OCD (obsessive compulsive disorder)   . Pain in joint, site unspecified   . Pure hypercholesterolemia   . Scoliosis   . Situational stress   . Spastic colon   . Tubular adenoma   . Uterine cancer (HCC)     Medications:  Xarelto 15 mg BID >> 20 mg bid initiated, completed 7.5 days transition to IV- last dose this AM at  0900  Assessment: 74 y/o F with a h/o anal cancer with PE and DVT diagnosed this admission. GI evaluating for 5 point drop in hemoglobin possibly from GI source vs RPB. FOB+ and has had rectal bleeding. CT abdomen/pelvis ordered and patient may require endoscopy and colonoscopy. Pharmacy consulted to transition to IV UFH from Xarelto.    10/07/2020:  APTT 87 sec- in goal range.    Heparin level - >2.2 falsely elevated as expected due to DOAC interaction.  Will continue to use aptt for heparin monitoring until levels correlate  CBC: Hg 8.0/pltc 137- low but relatively stable since admit  Blood in stool noted by RN 10/14 - no change since admit, no other bleeding noted  No interruptions in heparin therapy  Patient did not tolerate bowel prep so procedure for today canceled; plan to re-attempt prep today  Goal of Therapy:  Heparin level 0.3-0.7 units/ml (possible GIB/RPB, target low end of goal range and avoid boluses if possible) APTT 66-102 (possible GIB/RPB, target low end  of goal range and avoid boluses if possible) Monitor platelets by anticoagulation protocol: Yes   Plan:  Continue heparin infusion to 1400 units/hr  Will proceed with conservative dosing and avoid boluses keeping in mind that patient has hemodynamically significant PE APTT in 8 hours.  Daily HL/CBC  Peggyann Juba, PharmD, BCPS Pharmacy: [phone] 10/07/2020,2:48 PM

## 2020-10-07 NOTE — Progress Notes (Signed)
Progress Note   Subjective  Patient had CT yesterday and when she returned to the floor she did not wish to pursue bowel preparation for possible EGD and colonoscopy today She remains tired but willing to try the colonoscopy prep this evening No abdominal pain No overt bleeding Ortho involved with trochanteric fracture but they report this is nonoperative   Objective  Vital signs in last 24 hours: Temp:  [98.1 F (36.7 C)-98.2 F (36.8 C)] 98.2 F (36.8 C) (10/17 1228) Pulse Rate:  [86-94] 91 (10/17 1228) Resp:  [16-18] 17 (10/17 1228) BP: (110-133)/(64-73) 122/69 (10/17 1228) SpO2:  [94 %-95 %] 94 % (10/17 1228) Last BM Date: 10/06/20 Gen: awake, alert, chronically ill-appearing lying in bed, no acute distress HEENT: anicteric CV: RRR Pulm: CTA b/l anteriorly Abd: soft, obese, nontender, +BS throughout Ext: no c/c, trace edema    Intake/Output from previous day: 10/16 0701 - 10/17 0700 In: 817.3 [P.O.:740; I.V.:77.3] Out: 2000 [Urine:2000] Intake/Output this shift: Total I/O In: 720 [P.O.:720] Out: 250 [Urine:250]  Lab Results: Recent Labs    10/05/20 1141 10/05/20 1823 10/06/20 1854 10/07/20 0512 10/07/20 0831  WBC 10.0  --   --  7.3  --   HGB 8.6*   < > 8.7* 8.2* 8.0*  HCT 25.9*   < > 26.1* 25.2* 24.3*  PLT 143*  --   --  137*  --    < > = values in this interval not displayed.   BMET Recent Labs    10/07/20 0512  NA 140  K 4.8  CL 95*  CO2 34*  GLUCOSE 109*  BUN 28*  CREATININE 0.86  CALCIUM 9.4   LFT Recent Labs    10/07/20 0512  PROT 5.9*  ALBUMIN 3.0*  AST 16  ALT 28  ALKPHOS 62  BILITOT 1.4*   PT/INR No results for input(s): LABPROT, INR in the last 72 hours. Hepatitis Panel No results for input(s): HEPBSAG, HCVAB, HEPAIGM, HEPBIGM in the last 72 hours.  Studies/Results: CT ABDOMEN PELVIS W CONTRAST  Result Date: 10/06/2020 CLINICAL DATA:  Left lower quadrant pain. Anemia. History of anal carcinoma. EXAM: CT ABDOMEN  AND PELVIS WITH CONTRAST TECHNIQUE: Multidetector CT imaging of the abdomen and pelvis was performed using the standard protocol following bolus administration of intravenous contrast. CONTRAST:  14mL OMNIPAQUE IOHEXOL 300 MG/ML  SOLN COMPARISON:  12/27/2019 FINDINGS: Lower chest: No acute abnormality. Hepatobiliary: Liver normal in size and overall attenuation. 1 cm low-attenuation lesion in the peripheral right lobe, not fully characterized, but without significant change from the prior study. No other liver masses or lesions. Chronic intra and extrahepatic bile duct dilation similar to the prior CT. Status post cholecystectomy. Pancreas: Partial fatty replacement of the pancreas. No mass or inflammation. Spleen: Top-normal in size. Several small calcifications consistent with healed granuloma. No masses. Adrenals/Urinary Tract: No adrenal masses. Mild bilateral renal cortical thinning. Symmetric renal enhancement and excretion. No renal masses, stones or hydronephrosis. Normal ureters. Normal bladder. Stomach/Bowel: There are numerous colonic diverticula most prominent along the sigmoid colon. No evidence of diverticulitis. No colonic wall thickening or adjacent inflammation. Mild generalized increase in the colonic stool burden. Stomach is moderately distended but otherwise unremarkable. Small bowel is normal in caliber. No wall thickening or inflammation. Appendix not discretely seen but no evidence of appendicitis. Vascular/Lymphatic: Aortic atherosclerosis. No aneurysm. No enlarged lymph nodes. Reproductive: Status post hysterectomy. No adnexal masses. Other: Small fat containing paraumbilical hernia.  No ascites. Musculoskeletal: Fracture of the greater  trochanter at the gluteal insertions, mildly comminuted, displaced superiorly by 1.5 cm. This is new from the prior CT. There is no adjacent edema to suggest that this is acute, however. No other fractures. No bone lesions. IMPRESSION: 1. No acute  abnormality within the abdomen or pelvis. There are numerous colonic diverticula, mostly along the sigmoid, but no evidence of diverticulitis. Mild generalized increased colonic stool burden. 2. Fracture of the greater trochanter of the left proximal femur, which is new from the prior CT, but which does not appear recent/acute. This warrants clinical correlation. 3. Chronic findings include mild intra and extrahepatic bile duct dilation, partial fatty replacement of the pancreas and aortic atherosclerosis. Electronically Signed   By: Lajean Manes M.D.   On: 10/06/2020 15:10   DG HIP UNILAT WITH PELVIS 2-3 VIEWS LEFT  Result Date: 10/06/2020 CLINICAL DATA:  Fracture EXAM: DG HIP (WITH OR WITHOUT PELVIS) 2-3V LEFT COMPARISON:  Same day CT abdomen pelvis, hip radiograph, 07/29/2020 FINDINGS: Fracture fragment adjacent to the greater trochanter of the left femur, as seen on same day CT and not evidently changed compared to prior radiographs dated 07/29/2020. Osteopenia. The pelvis and proximal right femur are unremarkable. Excreted contrast in the urinary bladder. IMPRESSION: Fracture fragment adjacent to the greater trochanter of the left femur, as seen on same day CT and not evidently changed compared to prior radiographs dated 07/29/2020. Electronically Signed   By: Eddie Candle M.D.   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 Acute anemia/hx of anal cancer felt to be in remission/recent DVT and PE requiring chronic anticoagulation. CT scan abdomen pelvis did not show any overt or concerning GI findings; colonic diverticulosis without diverticulitis.  Chronic intra and  extrahepatic biliary ductal dilatation likely related to cholecystectomy. Again she had an approximate 5 g drop in hemoglobin without overt GI bleeding but without other clear source.  Given the need for anticoagulation going forward in the setting of DVT PE, upper endoscopy and colonoscopy planned --Discussed with her proceeding with upper endoscopy and colonoscopy tomorrow, she is agreeable --EGD and colonoscopy tomorrow afternoon around 1:30 PM with Dr. Lyndel Safe assuming she is able to tolerate and complete the bowel preparation --Hemoglobin is stable since being readmitted, monitor daily --She continues heparin infusion will need to stop this 6 hours before procedure which would be 7:30 AM        LOS: 10 days   Jerene Bears  10/07/2020, 3:07 PM

## 2020-10-07 NOTE — Progress Notes (Signed)
[place] for Heparin Indication: pulmonary embolus and DVT  Allergies  Allergen Reactions  . Prozac [Fluoxetine Hcl] Anaphylaxis  . Codeine Itching  . Morphine And Related Itching  . Sulfa Antibiotics Itching and Nausea Only    Patient Measurements: Height: 5\' 6"  (167.6 cm) Weight: 99.8 kg (220 lb) IBW/kg (Calculated) : 59.3 Heparin Dosing Weight: 82 kg  Vital Signs: Temp: 98.6 F (37 C) (10/17 2109) Temp Source: Oral (10/17 2109) BP: 118/69 (10/17 2109) Pulse Rate: 87 (10/17 2109)  Labs: Recent Labs    10/05/20 1141 10/05/20 1823 10/07/20 0512 10/07/20 0512 10/07/20 0831 10/07/20 1402 10/07/20 1849 10/07/20 2201  HGB 8.6*   < > 8.2*   < > 8.0*  --  8.6*  --   HCT 25.9*   < > 25.2*  --  24.3*  --  26.0*  --   PLT 143*  --  137*  --   --   --   --   --   APTT  --   --  56*  --   --  87*  --  136*  HEPARINUNFRC  --   --  >2.20*  --   --   --   --   --   CREATININE  --   --  0.86  --   --   --   --   --    < > = values in this interval not displayed.    Estimated Creatinine Clearance: 70.5 mL/min (by C-G formula based on SCr of 0.86 mg/dL).   Medical History: Past Medical History:  Diagnosis Date  . Allergic rhinitis   . Arthritis   . Bursitis    Both Shoulders  . Chronic low back pain   . Degenerative disk disease   . Depression   . Diverticulitis   . Emphysema of lung ([place])   . Fibromyalgia   . GERD (gastroesophageal reflux disease)   . History of fibromyalgia   . Hypertension   . Hypothyroidism   . Irritable bowel syndrome   . Neuralgia, post-herpetic   . Obesity, unspecified   . Obstructive sleep apnea   . OCD (obsessive compulsive disorder)   . Pain in joint, site unspecified   . Pure hypercholesterolemia   . Scoliosis   . Situational stress   . Spastic colon   . Tubular adenoma   . Uterine cancer (HCC)     Medications:  Xarelto 15 mg BID >> 20 mg bid initiated, completed 7.5 days transition to  IV- last dose this AM at 0900  Assessment: 74 y/o F with a h/o anal cancer with PE and DVT diagnosed this admission. GI evaluating for 5 point drop in hemoglobin possibly from GI source vs RPB. FOB+ and has had rectal bleeding. CT abdomen/pelvis ordered and patient may require endoscopy and colonoscopy. Pharmacy consulted to transition to IV UFH from Xarelto.    10/07/2020:  APTT 136 sec- now trended above goal range.  Verified sample was collected from opposite arm as heparin infusion & infusion continues at 71ml/hr without issues.   CBC: Hg 8.6 this afternoon- remains relatively stable since admit.  No new bleeding noted.  Patient currently drinking bowel prep for procedure at 1:30 on 10/18  Goal of Therapy:  Heparin level 0.3-0.7 units/ml (possible GIB/RPB, target low end of goal range and avoid boluses if possible) APTT 66-102 (possible GIB/RPB, target low end of goal range and avoid boluses if possible) Monitor platelets by  anticoagulation protocol: Yes   Plan:  Hold heparin x 1hr then resume heparin infusion at reduced rate of 1200 units/hr  Will proceed with conservative dosing and avoid boluses keeping in mind that patient has hemodynamically significant PE D/C heparin at 0730 for procedure F/U plans to resume anticoagulation post-procedure  Netta Cedars, PharmD, BCPS Pharmacy: [phone] 10/07/2020,11:16 PM

## 2020-10-07 NOTE — Progress Notes (Signed)
Patient ID: Joanne Klein, female   DOB: 10/16/1946, 74 y.o.   MRN: [id number]  ORTHO NOTE:  Received call yesterday regarding CT findings of Left Greater Trochanter fracture.  Reviewed new imaging and agree with radiologist unchanged from previous study from initial evaluation of injury in August.  No restrictions regarding this fracture.  No need for further ortho consultation at this time.  May follow up outpatient with Dr. Ronnie Derby if pain persists.

## 2020-10-07 NOTE — Progress Notes (Addendum)
PROGRESS NOTE    Joanne Klein  WYO:[id number] DOB: [birth year] DOA: 09/27/2020 PCP: Hoyt Koch, MD    Chief Complaint  Patient presents with  . Fall    Brief Narrative:  [name] a 74 y.o.femalewith medical history significant ofrectal cancer status post chemotherapy and radiation therapy who developed significant myopathy following treatment, and has significant lower extremity weakness, neuralgia, known degenerative disc disease of the lumbar spine, GERD, hypertension, fibromyalgia, overall weakness with poor mobility, morbid obesity as well as obstructive sleep apnea, hypertension, hypothyroidism, who presented to the hospital with worsening weakness as well as another fall. She was evaluated and was being admitted with failure to thrive at home due to weakness with plan for possible PT OT and placement. While being evaluated, patient was noted to be hypoxic and also tachycardic. Work-up with CT angiogram of the chest showed pulmonary embolism. Patient has been placed on oxygen and is being treated and admitted for pulmonary embolism in addition to her chronic problems. Hospitalization further complicated by severe bilateral knee pain, continued weakness. She is recommended to go to SNF on discharge.   Subjective:  Reports able to move  more, today she is able to lift leg off the bed slightly , lower leg pain appear well controlled by current regiment, denies hip pain  Hemoglobin 8, no overt sign of blood loss,   She is room air this am, she denies sob  Assessment & Plan:   Principal Problem:   Pulmonary embolism (HCC) Active Problems:   Depression   Hypothyroidism   GERD (gastroesophageal reflux disease)   COPD GOLD II    Hyperlipidemia   Morbid obesity ([name])   Anal cancer ([name])   Chronic respiratory failure with hypoxia (HCC)   Weakness of both legs   Falls frequently   Heme positive stool   Acute blood loss anemia   History of anal cancer    History of pulmonary embolism   PE and DVT -She presented with fall and weakness, found to have PE and DVT on presentation -Venous Doppler ultrasound revealed indeterminant DVT of the right lower extremity -Echo without heart strain -DVT and PE due to immobility, need at least 3 mo anticoagulation treatment -She is started on-Xarelto , hold Xarelto on October 16, transition to heparin drip in anticipating endoscope for anemia work-up   Acute hypoxemic respiratory failure, baseline not home o2 dependent ( reports was on home o2 briefly when she received radiation treatment in the past ) -oxygen sat 88% on room air on presentation , likely due to PE, she does has h/o COPD but there is no wheezing --Continue nasal cannula O2 and wean as able  macrocytic anemia: b12/folate low normal, start on supplement hgb slowing trending down, she does has left leg skin breakdown from fall prior to admission Wound oozing minimal blood  FOBT + , reports brown stool with streaks of blood, reports h/o hemorrhoid Reports h/o gastric ulcer 25yrs ago, denies epigastric pain Case discussed with oncology due to her history of anal cancer , per oncology Dr. Lorenso Courier anal cancer is considered treated, he recommended GI consult  GI consulted,  hold Xarelto, transition to heparin drip in preparation for colonoscopy, appreciate GI input   Initial planned to proceed with colonoscopy on 10/17, however patient did not want to drink the prep on 10/16 due to burping and indigestion, she states will try to drink the prep on 10/17, GI Dr. [name] made aware.  Lower extremity weakness -She was  hospitalized for the same in July, had MRI of spine showed myositis, CK unremarkable -per patient she improved with steroid treatment however weakness got worse when steroid dose decreased in the last few weeks --EDP discussed with [name] with neurology who did not feel that MRI would provide more information.  It was felt on  admission that her condition is likely just worsened from being deconditioned. They had no other recommendations to offer per EDP discussion with neurology [name]. -- neurology reconsulted on October 14, neurology initially recommended MRI of the thigh however patient's not able to tolerate , case discussed with neurology again who recommend outpatient follow-up with neuromuscular specialist [name] for EMG and muscle biopsy .message sent to Dr. Krista Blue. -She is started back on Solu-Medrol from October 10, weakness appear improving on steroid, plan to taper by 58m weekly start from [place]-Reports significant pain bilateral lower extremity with light touch -Lyrica started October 11, appear improving  Bilateral knee pain: -Bilateral knee x-ray showed degenerative changes no acute findings -Due to her continued musculoskeletal pain as well as joint pain,  --CRP and sed rate mildly elevated  -ANA, RF, CCP negative. Aldolase negative -Continue pain control, IV steroids, PT OT  -Added lyrica for any neuropathic component. Also scheduled oxycodone as patient has had trouble getting medication on time prn   Left leg wound after fall Wound does not appear infected, continue daily dressing changes  Fracture of Left greater trochanter of the left proximal femur, incidental finding on CT abdomen obtained on October 16 -patient does not appear to have hip pain, she does have a lot of  Bilateral lower leg pain -ortho consulted, repeat left hip x ray obtained per Ortho request which is unchanged from hip x-ray obtained from August 8 -Ortho recommend " No restrictions regarding this fracture.  No need for further ortho consultation at this time.  May follow up outpatient with Dr. LRonnie Derbyif pain persists."    H/o multiple falls: 5/31 fell right ankle injury required brace by [name]6/14 fell  7/6 fell  8/8 forward on both knees , left hip evaluated by in the ED , "Left hip avulsion  fracture noted. Patient was able to ambulate using a walker as she does at home"  8/25 fell in bathroom  9/10 fell going out of the back door while using a walker, she took one step but right knee give out then both keen go down together  10/7 fell when trying to change diaper , when right knee give out, admitted to the hospital  History of rectal cancer -Follows with Dr. SBenay Spice Hypothyroidism -Continue Synthroid  GERD -Continue Protonix  Depression -Continue Zoloft  Hyperlipidemia -Continue Lipitor  Obesity Body mass index is 35.51 kg/m.   UTI:  urine in canister appear cloudy, patient appear drowsy, ua and urine culture obtained showed >100,000 g-rods, start rocephin, follow up on final urine culture.  DVT prophylaxis: Currently on heparin drip   Code Status: Full Family Communication: daughter over the phone daily Disposition:   Status is: Inpatient  Dispo: The patient is from: Home              Anticipated d/c is to: Skilled nursing facility              Anticipated d/c date is: To be determined, will need colonoscopy                Consultants:   Neurology  Oncology  Ortho   Procedures:   None  Antimicrobials:   Rocephin from 10/17      Objective: Vitals:   10/06/20 0641 10/06/20 1332 10/06/20 2137 10/07/20 0454  BP: 129/63 (!) 146/84 110/64 133/73  Pulse: 93 98 86 94  Resp: 14 17 16 18   Temp: 97.8 F (36.6 C) 98.1 F (36.7 C) 98.1 F (36.7 C) 98.2 F (36.8 C)  TempSrc: Oral Oral Oral   SpO2: 97% 92% 95% 94%  Weight:      Height:        Intake/Output Summary (Last 24 hours) at 10/07/2020 0858 Last data filed at 10/07/2020 0454 Gross per 24 hour  Intake 817.26 ml  Output 2000 ml  Net -1182.74 ml   Filed Weights   09/28/20 0848  Weight: 99.8 kg    Examination:  General exam: Chronically ill-appearing Respiratory system: Diminished at bases, no rales, no rhonchi, no wheezing, respiratory effort  normal. Cardiovascular system: S1 & S2 heard, RRR. No JVD, no murmur, No pedal edema. Gastrointestinal system: Abdomen is nondistended, soft and nontender.  Normal bowel sounds heard. Central nervous system: Alert and orientedx3. No focal neurological deficits. Extremities: Not able to lift bilateral lower extremity against gravity Skin: Left lower leg with dressing unwrapped round with gauze, left leg wound pic as shown below, does not appear infected Psychiatry: Judgement and insight appear normal. Mood & affect appropriate.       Data Reviewed: I have personally reviewed following labs and imaging studies  CBC: Recent Labs  Lab 10/01/20 0509 10/01/20 0509 10/02/20 0510 10/02/20 0510 10/03/20 0500 10/03/20 0500 10/05/20 1141 10/05/20 1823 10/06/20 0613 10/06/20 1854 10/07/20 0512  WBC 9.3  --  7.8  --  8.3  --  10.0  --   --   --  7.3  HGB 8.8*   < > 8.0*   < > 7.8*   < > 8.6* 8.5* 7.8* 8.7* 8.2*  HCT 25.8*   < > 23.5*   < > 23.1*   < > 25.9* 25.8* 24.1* 26.1* 25.2*  MCV 98.1  --  98.7  --  100.9*  --  100.4*  --   --   --  102.0*  PLT 135*  --  125*  --  142*  --  143*  --   --   --  137*   < > = values in this interval not displayed.    Basic Metabolic Panel: Recent Labs  Lab 10/03/20 0500 10/07/20 0512  NA 136 140  K 5.1 4.8  CL 98 95*  CO2 28 34*  GLUCOSE 124* 109*  BUN 35* 28*  CREATININE 0.78 0.86  CALCIUM 8.8* 9.4    GFR: Estimated Creatinine Clearance: 70.5 mL/min (by C-G formula based on SCr of 0.86 mg/dL).  Liver Function Tests: Recent Labs  Lab 10/07/20 0512  AST 16  ALT 28  ALKPHOS 62  BILITOT 1.4*  PROT 5.9*  ALBUMIN 3.0*    CBG: No results for input(s): GLUCAP in the last 168 hours.   Recent Results (from the past 240 hour(s))  Respiratory Panel by RT PCR (Flu A&B, Covid) - Nasopharyngeal Swab     Status: None   Collection Time: 09/27/20  8:33 PM   Specimen: Nasopharyngeal Swab  Result Value Ref Range Status   SARS Coronavirus  2 by RT PCR NEGATIVE NEGATIVE Final    Comment: (NOTE) SARS-CoV-2 target nucleic acids are NOT DETECTED.  The SARS-CoV-2 RNA is generally detectable in upper respiratoy  specimens during the acute phase of infection. The lowest concentration of SARS-CoV-2 viral copies this assay can detect is 131 copies/mL. A negative result does not preclude SARS-Cov-2 infection and should not be used as the sole basis for treatment or other patient management decisions. A negative result may occur with  improper specimen collection/handling, submission of specimen other than nasopharyngeal swab, presence of viral mutation(s) within the areas targeted by this assay, and inadequate number of viral copies (<131 copies/mL). A negative result must be combined with clinical observations, patient history, and epidemiological information. The expected result is Negative.  Fact Sheet for Patients:  PinkCheek.be  Fact Sheet for Healthcare Providers:  GravelBags.it  This test is no t yet approved or cleared by the [place] FDA and  has been authorized for detection and/or diagnosis of SARS-CoV-2 by FDA under an Emergency Use Authorization (EUA). This EUA will remain  in effect (meaning this test can be used) for the duration of the COVID-19 declaration under Section 564(b)(1) of the Act, 21 U.S.C. section 360bbb-3(b)(1), unless the authorization is terminated or revoked sooner.     Influenza A by PCR NEGATIVE NEGATIVE Final   Influenza B by PCR NEGATIVE NEGATIVE Final    Comment: (NOTE) The Xpert Xpress SARS-CoV-2/FLU/RSV assay is intended as an aid in  the diagnosis of influenza from Nasopharyngeal swab specimens and  should not be used as a sole basis for treatment. Nasal washings and  aspirates are unacceptable for Xpert Xpress SARS-CoV-2/FLU/RSV  testing.  Fact Sheet for Patients: PinkCheek.be  Fact Sheet  for Healthcare Providers: GravelBags.it  This test is not yet approved or cleared by the [place] FDA and  has been authorized for detection and/or diagnosis of SARS-CoV-2 by  FDA under an Emergency Use Authorization (EUA). This EUA will remain  in effect (meaning this test can be used) for the duration of the  Covid-19 declaration under Section 564(b)(1) of the Act, 21  U.S.C. section 360bbb-3(b)(1), unless the authorization is  terminated or revoked. Performed at [facility], [place] [address], [place], [place] [address]   SARS Coronavirus 2 by RT PCR (hospital order, performed in [facility] hospital lab) Nasopharyngeal Nasopharyngeal Swab     Status: None   Collection Time: 10/05/20  2:35 PM   Specimen: Nasopharyngeal Swab  Result Value Ref Range Status   SARS Coronavirus 2 NEGATIVE NEGATIVE Final    Comment: (NOTE) SARS-CoV-2 target nucleic acids are NOT DETECTED.  The SARS-CoV-2 RNA is generally detectable in upper and lower respiratory specimens during the acute phase of infection. The lowest concentration of SARS-CoV-2 viral copies this assay can detect is 250 copies / mL. A negative result does not preclude SARS-CoV-2 infection and should not be used as the sole basis for treatment or other patient management decisions.  A negative result may occur with improper specimen collection / handling, submission of specimen other than nasopharyngeal swab, presence of viral mutation(s) within the areas targeted by this assay, and inadequate number of viral copies (<250 copies / mL). A negative result must be combined with clinical observations, patient history, and epidemiological information.  Fact Sheet for Patients:   StrictlyIdeas.no  Fact Sheet for Healthcare Providers: BankingDealers.co.za  This test is not yet approved or  cleared by the [place] FDA and has been  authorized for detection and/or diagnosis of SARS-CoV-2 by FDA under an Emergency Use Authorization (EUA).  This EUA will remain in effect (meaning this test can be used) for the duration  of the COVID-19 declaration under Section 564(b)(1) of the Act, 21 U.S.C. section 360bbb-3(b)(1), unless the authorization is terminated or revoked sooner.  Performed at [facility], [place] [address] [place], [place] [address]   Culture, Urine     Status: Abnormal (Preliminary result)   Collection Time: 10/05/20 11:00 PM   Specimen: Urine, Random  Result Value Ref Range Status   Specimen Description   Final    URINE, RANDOM Performed at [place] [address], [place], [place] [address]    Special Requests   Final    NONE Performed at [facility], [place] [address], [place], [place] [address]    Culture (A)  Final    >=100,000 COLONIES/mL GRAM NEGATIVE RODS CULTURE REINCUBATED FOR BETTER GROWTH Performed at [facility], [place] [address] [place], [place] [address]    Report Status PENDING  Incomplete         Radiology Studies: CT ABDOMEN PELVIS W CONTRAST  Result Date: 10/06/2020 CLINICAL DATA:  Left lower quadrant pain. Anemia. History of anal carcinoma. EXAM: CT ABDOMEN AND PELVIS WITH CONTRAST TECHNIQUE: Multidetector CT imaging of the abdomen and pelvis was performed using the standard protocol following bolus administration of intravenous contrast. CONTRAST:  131m OMNIPAQUE IOHEXOL 300 MG/ML  SOLN COMPARISON:  12/27/2019 FINDINGS: Lower chest: No acute abnormality. Hepatobiliary: Liver normal in size and overall attenuation. 1 cm low-attenuation lesion in the peripheral right lobe, not fully characterized, but without significant change from the prior study. No other liver masses or lesions. Chronic intra and extrahepatic bile duct dilation similar to the prior CT. Status post cholecystectomy. Pancreas: Partial fatty  replacement of the pancreas. No mass or inflammation. Spleen: Top-normal in size. Several small calcifications consistent with healed granuloma. No masses. Adrenals/Urinary Tract: No adrenal masses. Mild bilateral renal cortical thinning. Symmetric renal enhancement and excretion. No renal masses, stones or hydronephrosis. Normal ureters. Normal bladder. Stomach/Bowel: There are numerous colonic diverticula most prominent along the sigmoid colon. No evidence of diverticulitis. No colonic wall thickening or adjacent inflammation. Mild generalized increase in the colonic stool burden. Stomach is moderately distended but otherwise unremarkable. Small bowel is normal in caliber. No wall thickening or inflammation. Appendix not discretely seen but no evidence of appendicitis. Vascular/Lymphatic: Aortic atherosclerosis. No aneurysm. No enlarged lymph nodes. Reproductive: Status post hysterectomy. No adnexal masses. Other: Small fat containing paraumbilical hernia.  No ascites. Musculoskeletal: Fracture of the greater trochanter at the gluteal insertions, mildly comminuted, displaced superiorly by 1.5 cm. This is new from the prior CT. There is no adjacent edema to suggest that this is acute, however. No other fractures. No bone lesions. IMPRESSION: 1. No acute abnormality within the abdomen or pelvis. There are numerous colonic diverticula, mostly along the sigmoid, but no evidence of diverticulitis. Mild generalized increased colonic stool burden. 2. Fracture of the greater trochanter of the left proximal femur, which is new from the prior CT, but which does not appear recent/acute. This warrants clinical correlation. 3. Chronic findings include mild intra and extrahepatic bile duct dilation, partial fatty replacement of the pancreas and aortic atherosclerosis. Electronically Signed   By: DLajean ManesM.D.   On: 10/06/2020 15:10   DG HIP UNILAT WITH PELVIS 2-3 VIEWS LEFT  Result Date: 10/06/2020 CLINICAL DATA:   Fracture EXAM: DG HIP (WITH OR WITHOUT PELVIS) 2-3V LEFT COMPARISON:  Same day CT abdomen pelvis, hip radiograph, 07/29/2020 FINDINGS: Fracture fragment adjacent to the greater trochanter of the left femur,  as seen on same day CT and not evidently changed compared to prior radiographs dated 07/29/2020. Osteopenia. The pelvis and proximal right femur are unremarkable. Excreted contrast in the urinary bladder. IMPRESSION: Fracture fragment adjacent to the greater trochanter of the left femur, as seen on same day CT and not evidently changed compared to prior radiographs dated 07/29/2020. Electronically Signed   By: Eddie Candle M.D.   On: 10/06/2020 18:23        Scheduled Meds: . cholecalciferol  1,000 Units Oral Daily  . cyanocobalamin  1,000 mcg Intramuscular Daily   Followed by  . [START ON 10/13/2020] vitamin B-12  1,000 mcg Oral Daily  . folic acid  1 mg Oral Daily  . levothyroxine  75 mcg Oral QAC breakfast  . LORazepam  1 mg Intravenous Once  . methylPREDNISolone (SOLU-MEDROL) injection  62.5 mg Intravenous Daily  . oxyCODONE  10 mg Oral Q4H  . oxyCODONE  10 mg Oral Q12H  . pantoprazole  40 mg Oral BID AC  . peg 3350 powder  0.5 kit Oral Once  . polyethylene glycol  17 g Oral BID  . pregabalin  75 mg Oral BID  . senna-docusate  1 tablet Oral BID  . sertraline  200 mg Oral QPM   Continuous Infusions: . heparin 1,400 Units/hr (10/07/20 0620)     LOS: 10 days   Time spent: 18mns,  Greater than 50% of this time was spent in counseling, explanation of diagnosis, planning of further management, and coordination of care.  I have personally reviewed and interpreted on  10/07/2020 daily labs, tele strips, I reviewed all nursing notes, pharmacy notes, consultant notes,  vitals, pertinent old records  I have discussed plan of care as described above with RN , patient and family on 10/07/2020  Voice Recognition /Dragon dictation system was used to create this note, attempts have been  made to correct errors. Please contact the author with questions and/or clarifications.   FFlorencia Reasons MD PhD FACP [facility]  Available via Epic secure chat 7am-7pm for nonurgent issues Please page for urgent issues To page the attending provider between 7A-7P or the covering provider during after hours 7P-7A, please log into the web site [email or web address] and access using universal [place] password for that web site. If you do not have the password, please call the hospital operator.    10/07/2020, 8:58 AM

## 2020-10-08 ENCOUNTER — Encounter (HOSPITAL_COMMUNITY): Payer: Self-pay | Admitting: Internal Medicine

## 2020-10-08 ENCOUNTER — Inpatient Hospital Stay (HOSPITAL_COMMUNITY): Payer: Medicare Other | Admitting: Registered Nurse

## 2020-10-08 ENCOUNTER — Encounter (HOSPITAL_COMMUNITY)
Admission: EM | Disposition: A | Discharge status: Skilled Nursing Facility | Payer: Self-pay | Source: Home / Self Care | Attending: Internal Medicine

## 2020-10-08 DIAGNOSIS — D62 Acute posthemorrhagic anemia: Secondary | ICD-10-CM | POA: Diagnosis not present

## 2020-10-08 DIAGNOSIS — D509 Iron deficiency anemia, unspecified: Secondary | ICD-10-CM

## 2020-10-08 DIAGNOSIS — F329 Major depressive disorder, single episode, unspecified: Secondary | ICD-10-CM | POA: Diagnosis not present

## 2020-10-08 DIAGNOSIS — E039 Hypothyroidism, unspecified: Secondary | ICD-10-CM | POA: Diagnosis not present

## 2020-10-08 DIAGNOSIS — K5520 Angiodysplasia of colon without hemorrhage: Secondary | ICD-10-CM

## 2020-10-08 DIAGNOSIS — I2609 Other pulmonary embolism with acute cor pulmonale: Secondary | ICD-10-CM | POA: Diagnosis not present

## 2020-10-08 HISTORY — PX: HOT HEMOSTASIS: SHX5433

## 2020-10-08 HISTORY — PX: COLONOSCOPY WITH PROPOFOL: SHX5780

## 2020-10-08 HISTORY — PX: BIOPSY: SHX5522

## 2020-10-08 HISTORY — PX: ESOPHAGOGASTRODUODENOSCOPY (EGD) WITH PROPOFOL: SHX5813

## 2020-10-08 HISTORY — PX: POLYPECTOMY: SHX5525

## 2020-10-08 LAB — PROTEIN ELECTROPHORESIS, SERUM
A/G Ratio: 1 (ref 0.7–1.7)
Albumin ELP: 2.8 g/dL — ABNORMAL LOW (ref 2.9–4.4)
Alpha-1-Globulin: 0.4 g/dL (ref 0.0–0.4)
Alpha-2-Globulin: 0.9 g/dL (ref 0.4–1.0)
Beta Globulin: 1.1 g/dL (ref 0.7–1.3)
Gamma Globulin: 0.5 g/dL (ref 0.4–1.8)
Globulin, Total: 2.9 g/dL (ref 2.2–3.9)
Total Protein ELP: 5.7 g/dL — ABNORMAL LOW (ref 6.0–8.5)

## 2020-10-08 LAB — COMPREHENSIVE METABOLIC PANEL
ALT: 22 U/L (ref 0–44)
AST: 12 U/L — ABNORMAL LOW (ref 15–41)
Albumin: 3 g/dL — ABNORMAL LOW (ref 3.5–5.0)
Alkaline Phosphatase: 58 U/L (ref 38–126)
Anion gap: 9 (ref 5–15)
BUN: 30 mg/dL — ABNORMAL HIGH (ref 8–23)
CO2: 30 mmol/L (ref 22–32)
Calcium: 8.5 mg/dL — ABNORMAL LOW (ref 8.9–10.3)
Chloride: 103 mmol/L (ref 98–111)
Creatinine, Ser: 0.83 mg/dL (ref 0.44–1.00)
GFR, Estimated: >60 mL/min (ref >60)
Glucose, Bld: 94 mg/dL (ref 70–99)
Potassium: 3.8 mmol/L (ref 3.5–5.1)
Sodium: 142 mmol/L (ref 135–145)
Total Bilirubin: 1.1 mg/dL (ref 0.3–1.2)
Total Protein: 5.4 g/dL — ABNORMAL LOW (ref 6.5–8.1)

## 2020-10-08 LAB — CBC
HCT: 23.6 % — ABNORMAL LOW (ref 36.0–46.0)
Hemoglobin: 7.6 g/dL — ABNORMAL LOW (ref 12.0–15.0)
MCH: 33.6 pg (ref 26.0–34.0)
MCHC: 32.2 g/dL (ref 30.0–36.0)
MCV: 104.4 fL — ABNORMAL HIGH (ref 80.0–100.0)
Platelets: 127 10*3/uL — ABNORMAL LOW (ref 150–400)
RBC: 2.26 MIL/uL — ABNORMAL LOW (ref 3.87–5.11)
RDW: 14.7 % (ref 11.5–15.5)
WBC: 6.5 10*3/uL (ref 4.0–10.5)
nRBC: 1.2 % — ABNORMAL HIGH (ref 0.0–0.2)

## 2020-10-08 LAB — HEMOGLOBIN AND HEMATOCRIT, BLOOD
HCT: 24.5 % — ABNORMAL LOW (ref 36.0–46.0)
Hemoglobin: 7.9 g/dL — ABNORMAL LOW (ref 12.0–15.0)

## 2020-10-08 SURGERY — ESOPHAGOGASTRODUODENOSCOPY (EGD) WITH PROPOFOL
Anesthesia: Monitor Anesthesia Care

## 2020-10-08 MED ORDER — LACTATED RINGERS IV SOLN
INTRAVENOUS | Status: DC
  Administered 2020-10-08: 1000 mL via INTRAVENOUS

## 2020-10-08 MED ORDER — HEPARIN (PORCINE) 25000 UT/250ML-% IV SOLN
1350.0000 [IU]/h | INTRAVENOUS | Status: AC
  Administered 2020-10-08 – 2020-10-09 (×2): 1200 [IU]/h via INTRAVENOUS
  Filled 2020-10-08: qty 250

## 2020-10-08 MED ORDER — SODIUM CHLORIDE 0.9 % IV SOLN: INTRAVENOUS | Status: DC

## 2020-10-08 MED ORDER — PROPOFOL 500 MG/50ML IV EMUL
INTRAVENOUS | Status: AC
  Filled 2020-10-08: qty 50

## 2020-10-08 MED ORDER — LIDOCAINE 2% (20 MG/ML) 5 ML SYRINGE
INTRAMUSCULAR | Status: DC | PRN
  Administered 2020-10-08: 60 mg via INTRAVENOUS

## 2020-10-08 MED ORDER — PROPOFOL 10 MG/ML IV BOLUS
INTRAVENOUS | Status: DC | PRN
  Administered 2020-10-08: 40 mg via INTRAVENOUS
  Administered 2020-10-08: 20 mg via INTRAVENOUS

## 2020-10-08 MED ORDER — PHENYLEPHRINE 40 MCG/ML (10ML) SYRINGE FOR IV PUSH (FOR BLOOD PRESSURE SUPPORT)
PREFILLED_SYRINGE | INTRAVENOUS | Status: DC | PRN
  Administered 2020-10-08 (×3): 80 ug via INTRAVENOUS

## 2020-10-08 MED ORDER — PROPOFOL 500 MG/50ML IV EMUL
INTRAVENOUS | Status: DC | PRN
  Administered 2020-10-08: 150 ug/kg/min via INTRAVENOUS

## 2020-10-08 SURGICAL SUPPLY — 25 items

## 2020-10-08 NOTE — Progress Notes (Signed)
    Progress Note  Just a quick note this morning, patient did complete her prep and tells me she is still passing some brownish colored liquid, but no longer solid stool.  Has no questions about the procedures.  Nursing staff tells me that they held her heparin as of 730 this morning.  Procedures scheduled for 1:30 with Dr. Lyndel Safe.  Ellouise Newer, PA-C

## 2020-10-08 NOTE — Progress Notes (Signed)
[place] for Heparin Indication: pulmonary embolus and DVT  Allergies  Allergen Reactions  . Prozac [Fluoxetine Hcl] Anaphylaxis  . Codeine Itching  . Morphine And Related Itching  . Sulfa Antibiotics Itching and Nausea Only    Patient Measurements: Height: 5\' 6"  (167.6 cm) Weight: 99.8 kg (220 lb) IBW/kg (Calculated) : 59.3 Heparin Dosing Weight: 82 kg  Vital Signs: Temp: 97.8 F (36.6 C) (10/18 1336) Temp Source: Oral (10/18 1336) BP: 162/66 (10/18 1410) Pulse Rate: 77 (10/18 1410)  Labs: Recent Labs    10/07/20 0512 10/07/20 0512 10/07/20 0831 10/07/20 0831 10/07/20 1402 10/07/20 1849 10/07/20 2201 10/08/20 0617  HGB 8.2*   < > 8.0*   < >  --  8.6*  --  7.6*  HCT 25.2*   < > 24.3*  --   --  26.0*  --  23.6*  PLT 137*  --   --   --   --   --   --  127*  APTT 56*  --   --   --  87*  --  136*  --   HEPARINUNFRC >2.20*  --   --   --   --   --   --   --   CREATININE 0.86  --   --   --   --   --   --  0.83   < > = values in this interval not displayed.    Estimated Creatinine Clearance: 73 mL/min (by C-G formula based on SCr of 0.83 mg/dL).   Medical History: Past Medical History:  Diagnosis Date  . Allergic rhinitis   . Arthritis   . Bursitis    Both Shoulders  . Chronic low back pain   . Degenerative disk disease   . Depression   . Diverticulitis   . Emphysema of lung ([place])   . Fibromyalgia   . GERD (gastroesophageal reflux disease)   . History of fibromyalgia   . Hypertension   . Hypothyroidism   . Irritable bowel syndrome   . Neuralgia, post-herpetic   . Obesity, unspecified   . Obstructive sleep apnea   . OCD (obsessive compulsive disorder)   . Pain in joint, site unspecified   . Pure hypercholesterolemia   . Scoliosis   . Situational stress   . Spastic colon   . Tubular adenoma   . Uterine cancer (HCC)     Medications:  Xarelto 15 mg BID >> 20 mg PO daily initiated, completed 7.5 days transition  to IV heparin on 10/16.  Assessment: 73 y/o F with a h/o anal cancer with PE and DVT diagnosed this admission. GI evaluating for 5 point drop in hemoglobin possibly from GI source vs RPB. FOB+ and has had rectal bleeding. Pharmacy consulted to transition anticoagulation from xarelto to IV UFH.   Significant Events: -10/18: EGD and colonoscopy  10/08/2020:  CBC: Hgb (7.6) low, decreased. Plt (127) low but stable  Heparin stopped at 0730 this morning prior to procedures. EGD and colonoscopy done today by Dr. Lyndel Safe.   MD consulted pharmacy to resume heparin at previous rate at 19:30 this evening  Goal of Therapy:  HL 0.3 - 0.7 APTT 66-102 Monitor platelets by anticoagulation protocol: Yes   Per discussion with Dr. Lyndel Safe, will target lower end of therapeutic range (HL 0.3 - 0.5 and/or aPTT 66-84 seconds) and avoid heparin boluses.   Plan:   Resume heparin infusion at previous rate of 1200 units/hr at 19:30  this evening  Check 8 hour HL/aPTT  CBC, HL/aPTT daily while on heparin infusion. Once HL and aPTT correlate, can transition to using HL only.  Monitor for signs of bleeding  Lenis Noon, PharmD 10/08/20 2:28 PM

## 2020-10-08 NOTE — H&P (View-Only) (Signed)
    Progress Note  Just a quick note this morning, patient did complete her prep and tells me she is still passing some brownish colored liquid, but no longer solid stool.  Has no questions about the procedures.  Nursing staff tells me that they held her heparin as of 730 this morning.  Procedures scheduled for 1:30 with Dr. Lyndel Safe.  Ellouise Newer, PA-C

## 2020-10-08 NOTE — Progress Notes (Signed)
PT Cancellation Note  Patient Details Name: Joanne Klein MRN: [id number] DOB: 06-16-1946   Cancelled Treatment:    Reason Eval/Treat Not Completed: Patient at procedure or test/unavailable   [name],KATHrine E 10/08/2020, 11:30 AM [name], DPT Acute Rehabilitation Services Pager: [phone] Office: [phone]

## 2020-10-08 NOTE — Transfer of Care (Signed)
Immediate Anesthesia Transfer of Care Note  Patient: Joanne Klein  Procedure(s) Performed: ESOPHAGOGASTRODUODENOSCOPY (EGD) WITH PROPOFOL (N/A ) COLONOSCOPY WITH PROPOFOL (N/A ) HOT HEMOSTASIS (ARGON PLASMA COAGULATION/BICAP) (N/A )  Patient Location: PACU  Anesthesia Type:MAC  Level of Consciousness: sedated  Airway & Oxygen Therapy: Patient Spontanous Breathing and Patient connected to face mask oxygen  Post-op Assessment: Report given to RN and Post -op Vital signs reviewed and stable  Post vital signs: Reviewed and stable  Last Vitals:  Vitals Value Taken Time  BP 150/54 10/08/20 1336  Temp    Pulse 80 10/08/20 1337  Resp 13 10/08/20 1337  SpO2 100 % 10/08/20 1337  Vitals shown include unvalidated device data.  Last Pain:  Vitals:   10/08/20 1130  TempSrc: Oral  PainSc: 6       Patients Stated Pain Goal: 1 (80/04/47 1580)  Complications: No complications documented.

## 2020-10-08 NOTE — Interval H&P Note (Signed)
History and Physical Interval Note:  10/08/2020 12:18 PM  Joanne Klein  has presented today for surgery, with the diagnosis of acute anemia and heme positive stools, history of anal cancer.  The various methods of treatment have been discussed with the patient and family. After consideration of risks, benefits and other options for treatment, the patient has consented to  Procedure(s): ESOPHAGOGASTRODUODENOSCOPY (EGD) WITH PROPOFOL (N/A) COLONOSCOPY WITH PROPOFOL (N/A) as a surgical intervention.  The patient's history has been reviewed, patient examined, no change in status, stable for surgery.  I have reviewed the patient's chart and labs.  Questions were answered to the patient's satisfaction.     Joanne Klein

## 2020-10-08 NOTE — Care Management Important Message (Signed)
Important Message  Patient Details IM Letter given to the Patient Name: Joanne Klein MRN: [id number] Date of Birth: 08/23/1946   Medicare Important Message Given:  Yes     [name] 10/08/2020, 10:28 AM

## 2020-10-08 NOTE — Anesthesia Postprocedure Evaluation (Signed)
Anesthesia Post Note  Patient: Girtie Wiersma Rubalcava  Procedure(s) Performed: ESOPHAGOGASTRODUODENOSCOPY (EGD) WITH PROPOFOL (N/A ) COLONOSCOPY WITH PROPOFOL (N/A ) HOT HEMOSTASIS (ARGON PLASMA COAGULATION/BICAP) (N/A )     Patient location during evaluation: Endoscopy Anesthesia Type: MAC Level of consciousness: oriented, sedated and patient cooperative Pain management: pain level controlled Vital Signs Assessment: post-procedure vital signs reviewed and stable Respiratory status: spontaneous breathing, nonlabored ventilation, patient connected to nasal cannula oxygen and respiratory function stable Cardiovascular status: stable and blood pressure returned to baseline Postop Assessment: no apparent nausea or vomiting Anesthetic complications: no   No complications documented.  Last Vitals:  Vitals:   10/08/20 1130 10/08/20 1336  BP: (!) 130/56 (!) 150/54  Pulse:  80  Resp: 11 13  Temp: 37.2 C   SpO2: 100% 100%    Last Pain:  Vitals:   10/08/20 1130  TempSrc: Oral  PainSc: 6                  Kawhi Diebold,E. Phenix Vandermeulen

## 2020-10-08 NOTE — Plan of Care (Signed)

## 2020-10-08 NOTE — Op Note (Signed)
[facility] Patient Name: Joanne Klein Procedure Date: 10/08/2020 MRN: [id number] Attending MD: Jackquline Denmark , MD Date of Birth: 1946/11/01 CSN: [id number] Age: 74 Admit Type: Inpatient Procedure:                Colonoscopy Indications:              Unexplained anemia- Acute anemia/hx of anal cancer                            felt to be in remission/recent DVT and PE requiring                            chronic AC. Neg CT AP. Providers:                Jackquline Denmark, MD, Clyde Lundborg, RN, Fransico Setters                            Mbumina, Technician Referring MD:              Medicines:                Monitored Anesthesia Care Complications:            No immediate complications. Estimated Blood Loss:     Estimated blood loss: none. Procedure:                Pre-Anesthesia Assessment:                           - Prior to the procedure, a History and Physical                            was performed, and patient medications and                            allergies were reviewed. The patient's tolerance of                            previous anesthesia was also reviewed. The risks                            and benefits of the procedure and the sedation                            options and risks were discussed with the patient.                            All questions were answered, and informed consent                            was obtained. Prior Anticoagulants: The patient has                            taken heparin, last dose was day of procedure. ASA  Grade Assessment: III - A patient with severe                            systemic disease. After reviewing the risks and                            benefits, the patient was deemed in satisfactory                            condition to undergo the procedure.                           After obtaining informed consent, the colonoscope                            was passed under direct vision.  Throughout the                            procedure, the patient's blood pressure, pulse, and                            oxygen saturations were monitored continuously. The                            CF-HQ190L ([id number]) Olympus colonoscope was                            introduced through the anus and advanced to the the                            cecum, identified by appendiceal orifice and                            ileocecal valve. The colonoscopy was technically                            difficult and complex due to poor bowel prep and a                            redundant colon. Successful completion of the                            procedure was aided by applying abdominal pressure                            and lavage. The patient tolerated the procedure                            well. The quality of the bowel preparation was                            adequate to identify polyps 6 mm and larger in  size. The ileocecal valve, appendiceal orifice, and                            rectum were photographed. Scope In: 1:00:26 PM Scope Out: 1:30:53 PM Scope Withdrawal Time: 0 hours 13 minutes 30 seconds  Total Procedure Duration: 0 hours 30 minutes 27 seconds  Findings:      External hemorrhoids were found on perianal exam. No anal masses or       recurrence of anal carcinoma.      A 12 mm polyp was found in the proximal transverse colon. The polyp was       sessile. The polyp was removed with a hot snare. Resection and retrieval       were complete. Estimated blood loss: none.      A 4 mm polyp was found in the mid transverse colon. The polyp was       sessile. The polyp was removed with a cold snare. Resection and       retrieval were complete. Of note, that d/t quality of prep, small and       flat lesions could have been missed.      Multiple medium-mouthed diverticula were found in the sigmoid colon,       descending colon (giving colon a "swiss-cheese  appearence") and few in       ascending colon. Threre was some luminal narrowing on sigmoid colon c/w       muscular hypertrophy.      A few small diffuse angioectasias c/w radiation proctitis without       bleeding were found in the distal rectum.      Non-bleeding internal hemorrhoids were found during retroflexion. The       hemorrhoids were small.      The exam was otherwise without abnormality on direct and retroflexion       views. Impression:               - External hemorrhoids found on perianal exam. No                            recurrence of anal Ca.                           - One 12 mm polyp in the proximal transverse colon,                            removed with a hot snare. Resected and retrieved.                           - One 4 mm polyp in the mid transverse colon,                            removed with a cold snare. Resected and retrieved.                           - Mod-severe predominantly left colonic                            diverticulosis.                           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                           - Non-bleeding internal hemorrhoids.                           - The examination was otherwise normal on direct                            and retroflexion views. Moderate Sedation:      Not Applicable - Patient had care per Anesthesia. Recommendation:           - Return patient to hospital ward for ongoing care.                           - Resume previous diet.                           - Resume heparin at prior dose today in 6 hrs (at                            7:30 pm).                           - Watch for any delayed post-polypectomy bleeding                           - Await pathology results.                           - Repeat colonoscopy (after 2 day prep) for                            surveillance based on pathology results, likely in                            6 months - 1 year  (d/t quality of prep). Would like                            her to be off AC prior to rpt colon.                           - Return to GI clinic in 6-8 weeks.                           - The findings and recommendations were discussed                            with the patient. She didn't want me to call anyone. Procedure Code(s):        --- Professional ---  45385, Colonoscopy, flexible; with removal of                            tumor(s), polyp(s), or other lesion(s) by snare                            technique Diagnosis Code(s):        --- Professional ---                           K64.8, Other hemorrhoids                           K63.5, Polyp of colon                           K55.20, Angiodysplasia of colon without hemorrhage                           D50.9, Iron deficiency anemia, unspecified                           K57.30, Diverticulosis of large intestine without                            perforation or abscess without bleeding CPT copyright 2019 [employer]. All rights reserved. The codes documented in this report are preliminary and upon coder review may  be revised to meet current compliance requirements. Jackquline Denmark, MD 10/08/2020 2:06:17 PM This report has been signed electronically. Number of Addenda: 0

## 2020-10-08 NOTE — Anesthesia Preprocedure Evaluation (Addendum)
Anesthesia Evaluation  Patient identified by MRN, date of birth, ID band Patient awake    Reviewed: Allergy & Precautions, NPO status , Patient's Chart, lab work & pertinent test results  History of Anesthesia Complications Negative for: history of anesthetic complications  Airway Mallampati: I  TM Distance: >3 FB Neck ROM: Full    Dental  (+) Missing, Dental Advisory Given   Pulmonary sleep apnea (does not use CPAP) , COPD, Current Smoker (vapes) and Patient abstained from smoking., PE 10/05/2020 SARS coronavirus NEG   breath sounds clear to auscultation       Cardiovascular hypertension, (-) angina Rhythm:Regular Rate:Normal  09/28/2020 ECHO: EF 60-65%, no significant valvular abnormalities   Neuro/Psych Anxiety Depression    GI/Hepatic Neg liver ROS, GERD  Medicated and Controlled,  Endo/Other  Hypothyroidism   Renal/GU negative Renal ROS     Musculoskeletal  (+) Arthritis , steroids,  Fibromyalgia -, narcotic dependent  Abdominal (+) + obese,   Peds  Hematology  (+) Blood dyscrasia (hb 7.6), anemia ,   Anesthesia Other Findings   Reproductive/Obstetrics                           Anesthesia Physical Anesthesia Plan  ASA: III  Anesthesia Plan: MAC   Post-op Pain Management:    Induction:   PONV Risk Score and Plan: 2 and Treatment may vary due to age or medical condition  Airway Management Planned: Natural Airway and Nasal Cannula  Additional Equipment: None  Intra-op Plan:   Post-operative Plan:   Informed Consent: I have reviewed the patients History and Physical, chart, labs and discussed the procedure including the risks, benefits and alternatives for the proposed anesthesia with the patient or authorized representative who has indicated his/her understanding and acceptance.     Dental advisory given  Plan Discussed with: CRNA and Surgeon  Anesthesia Plan Comments:         Anesthesia Quick Evaluation

## 2020-10-08 NOTE — Progress Notes (Signed)
PROGRESS NOTE    Joanne Klein  IOX:[id number] DOB: 10-27-1946 DOA: 09/27/2020 PCP: Hoyt Koch, MD    Brief Narrative:  74 year old female with history of rectal cancer status post chemotherapy and radiation therapy who developed significant myopathy and lower extremity weakness following treatment, debilitated, GERD, hypertension and fibromyalgia, morbid obesity and obstructive sleep apnea, hypertension and hypothyroidism presented to the hospital with worsening weakness and fall.  In the emergency room she was found hypoxic and tachycardic.  Work-up revealed submassive pulmonary embolism.  Hospitalization complicated by bilateral pain, ongoing weakness and drop in hemoglobin.   Assessment & Plan:   Principal Problem:   Pulmonary embolism (HCC) Active Problems:   Depression   Hypothyroidism   GERD (gastroesophageal reflux disease)   COPD GOLD II    Hyperlipidemia   Morbid obesity ([place])   Anal cancer (HCC)   Chronic respiratory failure with hypoxia (HCC)   Weakness of both legs   Falls frequently   Heme positive stool   Acute blood loss anemia   History of anal cancer   History of pulmonary embolism  Acute PE submassive/right lower extremity DVT.  Echocardiogram without right ventricular strain: History of cancer, DVT and PE aggravated by immobility.  Cancer thought to be in remission. Initially treated with Xarelto, drop in hemoglobin Now on heparin drip in anticipation of procedure Will probably treat with Lovenox until he stabilizes after procedure depending upon the findings.  Acute hypoxemic respiratory failure secondary to #1.  On minimum oxygen.  Anemia, acute on chronic.  B12 and folate normal.  Started on supplements. FOBT positive.  Now to remain on therapeutic anticoagulation. Followed by GI, currently remains on heparin infusion as well as Protonix twice daily, for EGD and colonoscopy today. Recent known hemoglobin 13-12-7.6 today.  Bilateral lower  extremity weakness/suspected myositis from chemotherapy: Seen by neurology. Recommended conservative management. To be followed by Dr. Krista Blue after discharge. Good response to steroid therapy, plan for prolonged taper.  Left greater trochanteric fracture: Seen by orthopedics.  Recommended conservative management and mobility.  Multiple falls: With progressive debility.  Work with PT OT.  Plan rehab at a SNF after medical stabilization.  Hypothyroidism: On Synthroid.  Depression: On Zoloft.  History of rectal cancer: Followed by Dr. Benay Spice.  Reportedly in remission.  Acute UTI present on admission: Growing E. coli.  On Rocephin.  Will continue until final cultures.    DVT prophylaxis: SCDs, heparin infusion   Code Status: Full code Family Communication: None Disposition Plan: Status is: Inpatient  Remains inpatient appropriate because:IV treatments appropriate due to intensity of illness or inability to take PO and Inpatient level of care appropriate due to severity of illness   Dispo:  Patient From: Home  Planned Disposition: [place]  Expected discharge date: 10/09/20  Medically stable for discharge: No          Consultants:   Gastroenterology  Procedures:   None  Antimicrobials:  Anti-infectives (From admission, onward)   Start     Dose/Rate Route Frequency Ordered Stop   10/07/20 1130  cefTRIAXone (ROCEPHIN) 1 g in sodium chloride 0.9 % 100 mL IVPB        1 g 200 mL/hr over 30 Minutes Intravenous Every 24 hours 10/07/20 1054           Subjective: Patient seen and examined.  No overnight events.  Feels tired and sleepy.  Able to had multiple bowel movements and she thinks she cleaned up. Plan for EGD today and  she is aware.  Objective: Vitals:   10/07/20 1225 10/07/20 1228 10/07/20 2109 10/08/20 0611  BP:  122/69 118/69 (!) 115/57  Pulse:  91 87 85  Resp:  17 18 18   Temp:  98.2 F (36.8 C) 98.6 F (37 C) 98.6 F (37 C)   TempSrc:  Oral Oral Oral  SpO2: (!) 87% 94% 100% (!) 89%  Weight:      Height:        Intake/Output Summary (Last 24 hours) at 10/08/2020 1009 Last data filed at 10/08/2020 0400 Gross per 24 hour  Intake 2307.1 ml  Output 1250 ml  Net 1057.1 ml   Filed Weights   09/28/20 0848  Weight: 99.8 kg    Examination:  General exam: Appears calm and comfortable currently on 2 L oxygen.  Chronically sick looking. Respiratory system: Clear to auscultation. Respiratory effort normal.  No added sounds. Cardiovascular system: S1 & S2 heard, RRR. No JVD, murmurs, rubs, gallops or clicks.  Chronic edematous and painful legs.  There is an open ulcer on the left leg. Gastrointestinal system: Abdomen is nondistended, soft and nontender. No organomegaly or masses felt. Normal bowel sounds heard.  No localized tenderness. Central nervous system: Alert and oriented. No focal neurological deficits. Extremities: Bilateral generalized weakness and symmetrical. Psychiatry: Judgement and insight appear normal. Mood & affect flat and anxious.    Data Reviewed: I have personally reviewed following labs and imaging studies  CBC: Recent Labs  Lab 10/02/20 0510 10/02/20 0510 10/03/20 0500 10/03/20 0500 10/05/20 1141 10/05/20 1823 10/06/20 1854 10/07/20 0512 10/07/20 0831 10/07/20 1849 10/08/20 0617  WBC 7.8  --  8.3  --  10.0  --   --  7.3  --   --  6.5  HGB 8.0*   < > 7.8*   < > 8.6*   < > 8.7* 8.2* 8.0* 8.6* 7.6*  HCT 23.5*   < > 23.1*   < > 25.9*   < > 26.1* 25.2* 24.3* 26.0* 23.6*  MCV 98.7  --  100.9*  --  100.4*  --   --  102.0*  --   --  104.4*  PLT 125*  --  142*  --  143*  --   --  137*  --   --  127*   < > = values in this interval not displayed.   Basic Metabolic Panel: Recent Labs  Lab 10/03/20 0500 10/07/20 0512 10/08/20 0617  NA 136 140 142  K 5.1 4.8 3.8  CL 98 95* 103  CO2 28 34* 30  GLUCOSE 124* 109* 94  BUN 35* 28* 30*  CREATININE 0.78 0.86 0.83  CALCIUM 8.8* 9.4  8.5*   GFR: Estimated Creatinine Clearance: 73 mL/min (by C-G formula based on SCr of 0.83 mg/dL). Liver Function Tests: Recent Labs  Lab 10/07/20 0512 10/08/20 0617  AST 16 12*  ALT 28 22  ALKPHOS 62 58  BILITOT 1.4* 1.1  PROT 5.9* 5.4*  ALBUMIN 3.0* 3.0*   No results for input(s): LIPASE, AMYLASE in the last 168 hours. No results for input(s): AMMONIA in the last 168 hours. Coagulation Profile: No results for input(s): INR, PROTIME in the last 168 hours. Cardiac Enzymes: No results for input(s): CKTOTAL, CKMB, CKMBINDEX, TROPONINI in the last 168 hours. BNP (last 3 results) No results for input(s): PROBNP in the last 8760 hours. HbA1C: No results for input(s): HGBA1C in the last 72 hours. CBG: No results for input(s): GLUCAP in the last 168 hours.  Lipid Profile: No results for input(s): CHOL, HDL, LDLCALC, TRIG, CHOLHDL, LDLDIRECT in the last 72 hours. Thyroid Function Tests: No results for input(s): TSH, T4TOTAL, FREET4, T3FREE, THYROIDAB in the last 72 hours. Anemia Panel: No results for input(s): VITAMINB12, FOLATE, FERRITIN, TIBC, IRON, RETICCTPCT in the last 72 hours. Sepsis Labs: Recent Labs  Lab 10/04/20 1440 10/04/20 1702  LATICACIDVEN 1.6 1.0    Recent Results (from the past 240 hour(s))  SARS Coronavirus 2 by RT PCR (hospital order, performed in [facility] hospital lab) Nasopharyngeal Nasopharyngeal Swab     Status: None   Collection Time: 10/05/20  2:35 PM   Specimen: Nasopharyngeal Swab  Result Value Ref Range Status   SARS Coronavirus 2 NEGATIVE NEGATIVE Final    Comment: (NOTE) SARS-CoV-2 target nucleic acids are NOT DETECTED.  The SARS-CoV-2 RNA is generally detectable in upper and lower respiratory specimens during the acute phase of infection. The lowest concentration of SARS-CoV-2 viral copies this assay can detect is 250 copies / mL. A negative result does not preclude SARS-CoV-2 infection and should not be used as the sole basis for  treatment or other patient management decisions.  A negative result may occur with improper specimen collection / handling, submission of specimen other than nasopharyngeal swab, presence of viral mutation(s) within the areas targeted by this assay, and inadequate number of viral copies (<250 copies / mL). A negative result must be combined with clinical observations, patient history, and epidemiological information.  Fact Sheet for Patients:   StrictlyIdeas.no  Fact Sheet for Healthcare Providers: BankingDealers.co.za  This test is not yet approved or  cleared by the [place] FDA and has been authorized for detection and/or diagnosis of SARS-CoV-2 by FDA under an Emergency Use Authorization (EUA).  This EUA will remain in effect (meaning this test can be used) for the duration of the COVID-19 declaration under Section 564(b)(1) of the Act, 21 U.S.C. section 360bbb-3(b)(1), unless the authorization is terminated or revoked sooner.  Performed at [facility] [place] [address], [place], [place] [address]   Culture, Urine     Status: Abnormal (Preliminary result)   Collection Time: 10/05/20 11:00 PM   Specimen: Urine, Random  Result Value Ref Range Status   Specimen Description   Final    URINE, RANDOM Performed at [facility] [address], [place], [place] [address]    Special Requests   Final    NONE Performed at [facility], [place] [address], [place], [place] [address]    Culture (A)  Final    >=100,000 COLONIES/mL ESCHERICHIA COLI CULTURE REINCUBATED FOR BETTER GROWTH Performed at [facility], [place] [address] [place], [place] [address]    Report Status PENDING  Incomplete         Radiology Studies: CT ABDOMEN PELVIS W CONTRAST  Result Date: 10/06/2020 CLINICAL DATA:  Left lower quadrant pain. Anemia. History of anal carcinoma. EXAM: CT ABDOMEN AND  PELVIS WITH CONTRAST TECHNIQUE: Multidetector CT imaging of the abdomen and pelvis was performed using the standard protocol following bolus administration of intravenous contrast. CONTRAST:  158mL OMNIPAQUE IOHEXOL 300 MG/ML  SOLN COMPARISON:  12/27/2019 FINDINGS: Lower chest: No acute abnormality. Hepatobiliary: Liver normal in size and overall attenuation. 1 cm low-attenuation lesion in the peripheral right lobe, not fully characterized, but without significant change from the prior study. No other liver masses or lesions. Chronic intra and extrahepatic bile duct dilation similar to the prior CT. Status post cholecystectomy. Pancreas:  Partial fatty replacement of the pancreas. No mass or inflammation. Spleen: Top-normal in size. Several small calcifications consistent with healed granuloma. No masses. Adrenals/Urinary Tract: No adrenal masses. Mild bilateral renal cortical thinning. Symmetric renal enhancement and excretion. No renal masses, stones or hydronephrosis. Normal ureters. Normal bladder. Stomach/Bowel: There are numerous colonic diverticula most prominent along the sigmoid colon. No evidence of diverticulitis. No colonic wall thickening or adjacent inflammation. Mild generalized increase in the colonic stool burden. Stomach is moderately distended but otherwise unremarkable. Small bowel is normal in caliber. No wall thickening or inflammation. Appendix not discretely seen but no evidence of appendicitis. Vascular/Lymphatic: Aortic atherosclerosis. No aneurysm. No enlarged lymph nodes. Reproductive: Status post hysterectomy. No adnexal masses. Other: Small fat containing paraumbilical hernia.  No ascites. Musculoskeletal: Fracture of the greater trochanter at the gluteal insertions, mildly comminuted, displaced superiorly by 1.5 cm. This is new from the prior CT. There is no adjacent edema to suggest that this is acute, however. No other fractures. No bone lesions. IMPRESSION: 1. No acute abnormality  within the abdomen or pelvis. There are numerous colonic diverticula, mostly along the sigmoid, but no evidence of diverticulitis. Mild generalized increased colonic stool burden. 2. Fracture of the greater trochanter of the left proximal femur, which is new from the prior CT, but which does not appear recent/acute. This warrants clinical correlation. 3. Chronic findings include mild intra and extrahepatic bile duct dilation, partial fatty replacement of the pancreas and aortic atherosclerosis. Electronically Signed   By: Lajean Manes M.D.   On: 10/06/2020 15:10   DG HIP UNILAT WITH PELVIS 2-3 VIEWS LEFT  Result Date: 10/06/2020 CLINICAL DATA:  Fracture EXAM: DG HIP (WITH OR WITHOUT PELVIS) 2-3V LEFT COMPARISON:  Same day CT abdomen pelvis, hip radiograph, 07/29/2020 FINDINGS: Fracture fragment adjacent to the greater trochanter of the left femur, as seen on same day CT and not evidently changed compared to prior radiographs dated 07/29/2020. Osteopenia. The pelvis and proximal right femur are unremarkable. Excreted contrast in the urinary bladder. IMPRESSION: Fracture fragment adjacent to the greater trochanter of the left femur, as seen on same day CT and not evidently changed compared to prior radiographs dated 07/29/2020. Electronically Signed   By: Eddie Candle M.D.   On: 10/06/2020 18:23        Scheduled Meds:  cholecalciferol  1,000 Units Oral Daily   cyanocobalamin  1,000 mcg Intramuscular Daily   Followed by   Derrill Memo ON 10/13/2020] vitamin B-12  1,000 mcg Oral Daily   folic acid  1 mg Oral Daily   levothyroxine  75 mcg Oral QAC breakfast   LORazepam  1 mg Intravenous Once   methylPREDNISolone (SOLU-MEDROL) injection  62.5 mg Intravenous Daily   oxyCODONE  10 mg Oral Q4H   oxyCODONE  10 mg Oral Q12H   pantoprazole  40 mg Oral BID AC   polyethylene glycol  17 g Oral BID   pregabalin  75 mg Oral BID   senna-docusate  1 tablet Oral BID   sertraline  200 mg Oral QPM    Continuous Infusions:  cefTRIAXone (ROCEPHIN)  IV 1 g (10/08/20 0914)     LOS: 11 days    Time spent: 35 minutes    Barb Merino, MD [facility] Pager [phone]

## 2020-10-08 NOTE — Op Note (Signed)
[facility] Patient Name: Joanne Klein Procedure Date: 10/08/2020 MRN: [id number] Attending MD: Jackquline Denmark , MD Date of Birth: 1946/02/16 CSN: [id number] Age: 74 Admit Type: Outpatient Procedure:                Upper GI endoscopy Indications:              Acute drop in Hb without any overt bleeding. H/O                            recent DVT/PE in need for [facility]. Providers:                Jackquline Denmark, MD, Clyde Lundborg, RN, Fransico Setters                            [place], Technician Referring MD:              Medicines:                Monitored Anesthesia Care Complications:            No immediate complications. Estimated Blood Loss:     Estimated blood loss: none. Procedure:                Pre-Anesthesia Assessment:                           - Prior to the procedure, a History and Physical                            was performed, and patient medications and                            allergies were reviewed. The patient's tolerance of                            previous anesthesia was also reviewed. The risks                            and benefits of the procedure and the sedation                            options and risks were discussed with the patient.                            All questions were answered, and informed consent                            was obtained. Prior Anticoagulants: The patient has                            taken heparin, last dose was day of procedure. ASA                            Grade Assessment: III - A patient with severe  systemic disease. After reviewing the risks and                            benefits, the patient was deemed in satisfactory                            condition to undergo the procedure.                           After obtaining informed consent, the endoscope was                            passed under direct vision. Throughout the                            procedure, the patient's  blood pressure, pulse, and                            oxygen saturations were monitored continuously. The                            [id number]-[id number] ([id number]) Olympus gastroscope was                            introduced through the mouth, and advanced to the                            second part of duodenum. The upper GI endoscopy was                            accomplished without difficulty. The patient                            tolerated the procedure well. Scope In: Scope Out: Findings:      Non-severe esophagitis with no bleeding was found 34 to 37 cm from the       incisors. Multiple circular rings of ? importance were noted in distal       1/3rd of esophagus. Over 6 Biopsies were taken with a cold forceps for       histology to r/o EoE. The Z-line was well defined at 37 cm.      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 Some active slow oozing       from the antral AVM. Fulguration to ablate the lesion by argon plasma at       0.4 liters/minute and 20 watts was successful. No bleeding towards end       of the procedure.      The examined duodenum was normal. Deep duodenal intubation was performed       upto 3rd portion of duodenum. No AVMs were noted in duodenum. Impression:               - Non-severe non-erosive esophagitis with no  bleeding. Biopsied.                           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 Treated with argon plasma coagulation                            (APC). Moderate Sedation:      Not Applicable - Patient had care per Anesthesia. Recommendation:           - Patient has a contact number available for                            emergencies. The signs and symptoms of potential                            delayed complications were discussed with the                            patient. Return to normal  activities tomorrow.                            Written discharge instructions were provided to the                            patient.                           - Resume previous diet.                           - Continue present medications including protonix                            40mg  po qd.                           - Await pathology results.                           - Resume heparin at prior dose today after 6 hrs                            (resume at 7:30 PM).                           - The findings and recommendations were discussed                            with the patient. Procedure Code(s):        --- Professional ---                           [phone], Esophagogastroduodenoscopy, flexible,  transoral; with ablation of tumor(s), polyp(s), or                            other lesion(s) (includes pre- and post-dilation                            and guide wire passage, when performed)                           [id number], 67, Esophagogastroduodenoscopy, flexible,                            transoral; with biopsy, single or multiple Diagnosis Code(s):        --- Professional ---                           K20.80, Other esophagitis without bleeding                           K31.819, Angiodysplasia of stomach and duodenum                            without bleeding                           D50.9, Iron deficiency anemia, unspecified CPT copyright 2019 American Medical Association. All rights reserved. The codes documented in this report are preliminary and upon coder review may  be revised to meet current compliance requirements. Jackquline Denmark, MD 10/08/2020 1:46:50 PM This report has been signed electronically. Number of Addenda: 0

## 2020-10-08 NOTE — Anesthesia Procedure Notes (Signed)
Date/Time: 10/08/2020 12:34 PM Performed by: Talbot Grumbling, CRNA Oxygen Delivery Method: Simple face mask

## 2020-10-09 DIAGNOSIS — K635 Polyp of colon: Secondary | ICD-10-CM

## 2020-10-09 DIAGNOSIS — K5730 Diverticulosis of large intestine without perforation or abscess without bleeding: Secondary | ICD-10-CM

## 2020-10-09 DIAGNOSIS — Z7901 Long term (current) use of anticoagulants: Secondary | ICD-10-CM

## 2020-10-09 DIAGNOSIS — K31819 Angiodysplasia of stomach and duodenum without bleeding: Secondary | ICD-10-CM | POA: Diagnosis not present

## 2020-10-09 DIAGNOSIS — K627 Radiation proctitis: Secondary | ICD-10-CM

## 2020-10-09 DIAGNOSIS — E039 Hypothyroidism, unspecified: Secondary | ICD-10-CM | POA: Diagnosis not present

## 2020-10-09 DIAGNOSIS — C210 Malignant neoplasm of anus, unspecified: Secondary | ICD-10-CM | POA: Diagnosis not present

## 2020-10-09 DIAGNOSIS — D62 Acute posthemorrhagic anemia: Secondary | ICD-10-CM | POA: Diagnosis not present

## 2020-10-09 DIAGNOSIS — D539 Nutritional anemia, unspecified: Secondary | ICD-10-CM

## 2020-10-09 DIAGNOSIS — F329 Major depressive disorder, single episode, unspecified: Secondary | ICD-10-CM | POA: Diagnosis not present

## 2020-10-09 DIAGNOSIS — I2609 Other pulmonary embolism with acute cor pulmonale: Secondary | ICD-10-CM | POA: Diagnosis not present

## 2020-10-09 LAB — CBC
HCT: 24.2 % — ABNORMAL LOW (ref 36.0–46.0)
Hemoglobin: 7.8 g/dL — ABNORMAL LOW (ref 12.0–15.0)
MCH: 33.5 pg (ref 26.0–34.0)
MCHC: 32.2 g/dL (ref 30.0–36.0)
MCV: 103.9 fL — ABNORMAL HIGH (ref 80.0–100.0)
Platelets: 120 K/uL — ABNORMAL LOW (ref 150–400)
RBC: 2.33 MIL/uL — ABNORMAL LOW (ref 3.87–5.11)
RDW: 15.2 % (ref 11.5–15.5)
WBC: 7.3 K/uL (ref 4.0–10.5)
nRBC: 1 % — ABNORMAL HIGH (ref 0.0–0.2)

## 2020-10-09 LAB — HEPARIN LEVEL (UNFRACTIONATED): Heparin Unfractionated: 0.23 [IU]/mL — ABNORMAL LOW (ref 0.30–0.70)

## 2020-10-09 LAB — SURGICAL PATHOLOGY

## 2020-10-09 LAB — APTT: aPTT: 39 s — ABNORMAL HIGH (ref 24–36)

## 2020-10-09 MED ORDER — PREDNISONE 20 MG PO TABS
40.0000 mg | ORAL_TABLET | Freq: Every day | ORAL | Status: DC
  Administered 2020-10-10: 40 mg via ORAL
  Filled 2020-10-09: qty 2

## 2020-10-09 MED ORDER — RIVAROXABAN 20 MG PO TABS: 20.0000 mg | ORAL_TABLET | Freq: Every day | ORAL | Status: DC

## 2020-10-09 MED ORDER — RIVAROXABAN 15 MG PO TABS
15.0000 mg | ORAL_TABLET | Freq: Two times a day (BID) | ORAL | Status: DC
  Administered 2020-10-09 – 2020-10-10 (×3): 15 mg via ORAL
  Filled 2020-10-09 (×3): qty 1

## 2020-10-09 MED ORDER — SODIUM CHLORIDE 0.9 % IV SOLN
510.0000 mg | Freq: Once | INTRAVENOUS | Status: AC
  Administered 2020-10-09: 510 mg via INTRAVENOUS
  Filled 2020-10-09: qty 510

## 2020-10-09 NOTE — Progress Notes (Signed)
[facility] Progress Note  Chief Complaint: Anemia  History:  Signout received from Dr. [name], case discussed with Dr. Lyndel Safe yesterday before and after his endoscopic procedures. Joanne Klein still does not have overt GI bleeding, she was heme positive and noted to have a significant hemoglobin drop in approximately a week.  Also noted to have macrocytosis, B12 level is low, no iron levels drawn.  She denies abdominal pain, appetite is "fair", denies nausea vomiting or dysphagia.  EGD found a few diminutive AVMs, one with some scant oozing in the distal stomach, all treated with APC.  Colon with fair prep, 2 small polyps removed, significant diverticulosis and what appears to be mild distal nonbleeding radiation proctopathy (no therapy applied).  ROS: Cardiovascular: Denies chest pain Respiratory: Denies dyspnea Urinary: Denies dysuria Chronic generalized muscle weakness.  Objective:   Current Facility-Administered Medications:  .  acetaminophen (TYLENOL) tablet 650 mg, 650 mg, Oral, Q6H PRN, 650 mg at 09/29/20 1203 **OR** acetaminophen (TYLENOL) suppository 650 mg, 650 mg, Rectal, Q6H PRN, Jonelle Sidle, Mohammad L, MD .  alum & mag hydroxide-simeth (MAALOX/MYLANTA) 200-200-20 MG/5ML suspension 30 mL, 30 mL, Oral, Q6H PRN, Dessa Phi, DO, 30 mL at 10/03/20 2131 .  calcium carbonate (TUMS - dosed in mg elemental calcium) chewable tablet 200 mg of elemental calcium, 1 tablet, Oral, Daily PRN, Dessa Phi, DO .  cefTRIAXone (ROCEPHIN) 1 g in sodium chloride 0.9 % 100 mL IVPB, 1 g, Intravenous, Q24H, Florencia Reasons, MD, Last Rate: 200 mL/hr at 10/09/20 0917, 1 g at 10/09/20 0917 .  cholecalciferol (VITAMIN D) tablet 1,000 Units, 1,000 Units, Oral, Daily, Elwyn Reach, MD, 1,000 Units at 10/09/20 0907 .  cyanocobalamin ((VITAMIN B-12)) injection 1,000 mcg, 1,000 mcg, Intramuscular, Daily, 1,000 mcg at 10/09/20 0918 **FOLLOWED BY** [START ON 10/13/2020] vitamin B-12 (CYANOCOBALAMIN) tablet 1,000 mcg,  1,000 mcg, Oral, Daily, Donnetta Simpers, MD .  fentaNYL (SUBLIMAZE) injection 25 mcg, 25 mcg, Intravenous, Q2H PRN, Dessa Phi, DO .  folic acid (FOLVITE) tablet 1 mg, 1 mg, Oral, Daily, Donnetta Simpers, MD, 1 mg at 10/09/20 0907 .  heparin ADULT infusion 100 units/mL (25000 units/256mL sodium chloride 0.45%), 1,350 Units/hr, Intravenous, Continuous, Angela Adam, [facility], Last Rate: 13.5 mL/hr at 10/09/20 0646, 1,350 Units/hr at 10/09/20 0646 .  levothyroxine (SYNTHROID) tablet 75 mcg, 75 mcg, Oral, QAC breakfast, Elwyn Reach, MD, 75 mcg at 10/09/20 0602 .  LORazepam (ATIVAN) injection 1 mg, 1 mg, Intravenous, Once, Lang Snow, FNP .  methylPREDNISolone sodium succinate (SOLU-MEDROL) 125 mg/2 mL injection 62.5 mg, 62.5 mg, Intravenous, Daily, Dessa Phi, DO, 62.5 mg at 10/09/20 0912 .  ondansetron (ZOFRAN) tablet 4 mg, 4 mg, Oral, Q6H PRN, 4 mg at 09/28/20 1828 **OR** ondansetron (ZOFRAN) injection 4 mg, 4 mg, Intravenous, Q6H PRN, Elwyn Reach, MD, 4 mg at 10/01/20 2138 .  oxyCODONE (Oxy IR/ROXICODONE) immediate release tablet 10 mg, 10 mg, Oral, Q4H, Dessa Phi, DO, 10 mg at 10/09/20 0907 .  oxyCODONE (OXYCONTIN) 12 hr tablet 10 mg, 10 mg, Oral, Q12H, Dessa Phi, DO, 10 mg at 10/09/20 0907 .  pantoprazole (PROTONIX) EC tablet 40 mg, 40 mg, Oral, BID AC, Dessa Phi, DO, 40 mg at 10/09/20 0907 .  phenylephrine-shark liver oil-mineral oil-petrolatum (PREPARATION H) rectal ointment 1 application, 1 application, Rectal, BID PRN, Jonelle Sidle, Mohammad L, MD .  polyethylene glycol (MIRALAX / GLYCOLAX) packet 17 g, 17 g, Oral, BID, Florencia Reasons, MD, 17 g at 10/09/20 0930 .  pregabalin (LYRICA) capsule 75 mg, 75 mg, Oral,  BID, Dessa Phi, DO, 75 mg at 10/09/20 6073 .  prochlorperazine (COMPAZINE) tablet 10 mg, 10 mg, Oral, Q6H PRN, Dessa Phi, DO .  senna-docusate (Senokot-S) tablet 1 tablet, 1 tablet, Oral, BID, Florencia Reasons, MD, 1 tablet at 10/09/20 0907 .  sertraline  (ZOLOFT) tablet 200 mg, 200 mg, Oral, QPM, Garba, Mohammad L, MD, 200 mg at 10/08/20 1752  . cefTRIAXone (ROCEPHIN)  IV 1 g (10/09/20 0917)  . heparin 1,350 Units/hr (10/09/20 7106)     Vital signs in last 24 hrs: Vitals:   10/08/20 2226 10/09/20 0424  BP: 134/73 (!) 111/58  Pulse: 87 85  Resp: 20 14  Temp: 98.8 F (37.1 C) 98.8 F (37.1 C)  SpO2: 97% 96%    Intake/Output Summary (Last 24 hours) at 10/09/2020 0933 Last data filed at 10/09/2020 0240 Gross per 24 hour  Intake 103.84 ml  Output 550 ml  Net -446.16 ml     Physical Exam Fatigued, chronically ill and weak, pleasant and conversational  HEENT: sclera anicteric, oral mucosa without lesions  Neck: supple, no thyromegaly, JVD or lymphadenopathy  Cardiac: RRR without murmurs, S1S2 heard, + peripheral edema  Pulm: clear to auscultation bilaterally, normal RR and effort noted  Abdomen: soft, no tenderness, with active bowel sounds.  No appreciable hepatosplenomegaly or mass given limitations of body habitus  Skin; warm and dry, pale, no jaundice  Recent Labs:  CBC Latest Ref Rng & Units 10/09/2020 10/08/2020 10/08/2020  WBC 4.0 - 10.5 K/uL 7.3 - 6.5  Hemoglobin 12.0 - 15.0 g/dL 7.8(L) 7.9(L) 7.6(L)  Hematocrit 36 - 46 % 24.2(L) 24.5(L) 23.6(L)  Platelets 150 - 400 K/uL 120(L) - 127(L)    No results for input(s): INR in the last 168 hours. CMP Latest Ref Rng & Units 10/08/2020 10/07/2020 10/03/2020  Glucose 70 - 99 mg/dL 94 109(H) 124(H)  BUN 8 - 23 mg/dL 30(H) 28(H) 35(H)  Creatinine 0.44 - 1.00 mg/dL 0.83 0.86 0.78  Sodium 135 - 145 mmol/L 142 140 136  Potassium 3.5 - 5.1 mmol/L 3.8 4.8 5.1  Chloride 98 - 111 mmol/L 103 95(L) 98  CO2 22 - 32 mmol/L 30 34(H) 28  Calcium 8.9 - 10.3 mg/dL 8.5(L) 9.4 8.8(L)  Total Protein 6.5 - 8.1 g/dL 5.4(L) 5.9(L) -  Total Bilirubin 0.3 - 1.2 mg/dL 1.1 1.4(H) -  Alkaline Phos 38 - 126 U/L 58 62 -  AST 15 - 41 U/L 12(L) 16 -  ALT 0 - 44 U/L 22 28 -     Assessment  & Plan  Assessment: Heme positive stool Multifactorial macrocytic anemia with low B12 level, unknown if iron deficient. 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  Recommendations:  I think she would benefit from a dose of IV iron She needs B12 replacement No further endoscopic procedures or additional GI work-up planned at present. Oral anticoagulation can be resumed today. Close follow-up of hemoglobin and hematocrit along with iron levels and B12 with regular replacements as needed is essential.  Signing off, no regular GI office follow-up needed at this point, call as needed.   25 minutes were spent on this encounter (including chart review, history/exam, counseling/coordination of care, and documentation)   Nelida Meuse III Office: [phone]

## 2020-10-09 NOTE — Progress Notes (Signed)
PROGRESS NOTE    Joanne Klein  GEX:[id number] DOB: 12-09-1946 DOA: 09/27/2020 PCP: Hoyt Koch, MD    Brief Narrative:  74 year old female with history of rectal cancer status post chemotherapy and radiation therapy who developed significant myopathy and lower extremity weakness following treatment, debilitated, GERD, hypertension and fibromyalgia, morbid obesity and obstructive sleep apnea, hypertension and hypothyroidism presented to the hospital with worsening weakness and fall.  In the emergency room she was found hypoxic and tachycardic.  Work-up revealed submassive pulmonary embolism.  Hospitalization complicated by bilateral leg pain, ongoing weakness and drop in hemoglobin.   Assessment & Plan:   Principal Problem:   Pulmonary embolism (HCC) Active Problems:   Depression   Hypothyroidism   GERD (gastroesophageal reflux disease)   COPD GOLD II    Hyperlipidemia   Morbid obesity ([place])   Anal cancer (HCC)   Chronic respiratory failure with hypoxia (HCC)   Weakness of both legs   Falls frequently   Heme positive stool   Acute blood loss anemia   History of anal cancer   History of pulmonary embolism  Acute PE submassive/right lower extremity DVT.  Echocardiogram without right ventricular strain: History of cancer, DVT and PE aggravated by immobility.  Cancer thought to be in remission. Initially treated with Xarelto, drop in hemoglobin Treated with heparin periprocedure.  Hemoglobin stable.  No active bleeding during endoluminal evaluation. Discontinue heparin drip. Start Xarelto, day 11 of therapeutic anticoagulation, Xarelto 15 mg twice a day for next 10 days then maintenance with Xarelto 20 mg/day.  She will stay on anticoagulation for at least 6 months or indefinite if her mobility does not improve.  Acute hypoxemic respiratory failure secondary to #1.  On minimum oxygen.  Anemia, acute on chronic.  Macrocytic anemia.  Low B12.  Folate acid was normal.   Iron levels not monitored.  B12 aggressive replacement with injectables until in the hospital, restart oral on discharge. 1 dose of Feraheme today. Underwent EGD and colonoscopy EGD with few none active AVMs, one with scant wheezing in the distal stomach treated with APC. Colonoscopy with no active bleeding, diverticulosis, radiation proctopathy. Hemoglobin has remained fairly stable since initial drop. On Protonix twice daily. Challenge today with Xarelto.  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 Recommended conservative management. To be followed by Dr. Krista Blue after discharge. Good response to steroid therapy, plan for prolonged taper. Currently on Solu-Medrol for last 10 days, will change to prednisone 40 mg daily with plan to continue taper down 10 mg every week and follow-up with neurology outpatient.  Left greater trochanteric fracture: Seen by orthopedics.  Recommended conservative management and mobility.  Multiple falls: With progressive debility.  Work with PT OT.  Plan rehab at a SNF after medical stabilization.  Hypothyroidism: On Synthroid.  Depression: On Zoloft.  History of rectal cancer: Followed by Dr. Benay Spice.  Reportedly in remission.  Acute UTI present on admission: Growing E. coli.  On Rocephin.  Will continue until final cultures.    DVT prophylaxis: SCDs, heparin infusion, start Xarelto today.   Code Status: Full code Family Communication: Patient's niece Ms. Andi on the phone Disposition Plan: Status is: Inpatient  Remains inpatient appropriate because:IV treatments appropriate due to intensity of illness or inability to take PO and Inpatient level of care appropriate due to severity of illness   Dispo:  Patient From: Home  Planned Disposition: Joanne Klein  Expected discharge date: 10/20  medically stable for discharge: No  Consultants:    Gastroenterology  Procedures:   None  Antimicrobials:  Anti-infectives (From admission, onward)   Start     Dose/Rate Route Frequency Ordered Stop   10/07/20 1130  cefTRIAXone (ROCEPHIN) 1 g in sodium chloride 0.9 % 100 mL IVPB        1 g 200 mL/hr over 30 Minutes Intravenous Every 24 hours 10/07/20 1054           Subjective: Patient seen and examined.  No overnight events.  Denies any nausea vomiting.  Tolerated heparin after procedure well.  No more bowel movement since colonoscopy prep. Legs feel weak. She wanted to explore whether she can only start rehab when her legs are back to normal strength.  Objective: Vitals:   10/08/20 1410 10/08/20 1439 10/08/20 2226 10/09/20 0424  BP: (!) 162/66 (!) 158/80 134/73 (!) 111/58  Pulse: 77 78 87 85  Resp: 11 20 20 14   Temp:  97.8 F (36.6 C) 98.8 F (37.1 C) 98.8 F (37.1 C)  TempSrc:  Oral Oral Oral  SpO2: 95% 97% 97% 96%  Weight:      Height:        Intake/Output Summary (Last 24 hours) at 10/09/2020 1323 Last data filed at 10/09/2020 0240 Gross per 24 hour  Intake 103.84 ml  Output 550 ml  Net -446.16 ml   Filed Weights   09/28/20 0848 10/08/20 1130  Weight: 99.8 kg 99.8 kg    Examination:  General exam: Appears calm and comfortable currently on 2 L oxygen.  Chronically sick looking.  Not in any distress. Respiratory system: Clear to auscultation. Respiratory effort normal.  No added sounds. Cardiovascular system: S1 & S2 heard, RRR. No JVD, murmurs, rubs, gallops or clicks.  Chronic edematous and painful legs.  There is an open ulcer on the left leg. Gastrointestinal system: Abdomen is nondistended, soft and nontender. No organomegaly or masses felt. Normal bowel sounds heard.  No localized tenderness. Central nervous system: Alert and oriented. No focal neurological deficits. Extremities: Bilateral generalized weakness and symmetrical. Psychiatry: Judgement and insight appear normal. Mood & affect  normal.    Data Reviewed: I have personally reviewed following labs and imaging studies  CBC: Recent Labs  Lab 10/03/20 0500 10/03/20 0500 10/05/20 1141 10/05/20 1823 10/07/20 0512 10/07/20 0512 10/07/20 0831 10/07/20 1849 10/08/20 0617 10/08/20 1707 10/09/20 0359  WBC 8.3  --  10.0  --  7.3  --   --   --  6.5  --  7.3  HGB 7.8*   < > 8.6*   < > 8.2*   < > 8.0* 8.6* 7.6* 7.9* 7.8*  HCT 23.1*   < > 25.9*   < > 25.2*   < > 24.3* 26.0* 23.6* 24.5* 24.2*  MCV 100.9*  --  100.4*  --  102.0*  --   --   --  104.4*  --  103.9*  PLT 142*  --  143*  --  137*  --   --   --  127*  --  120*   < > = values in this interval not displayed.   Basic Metabolic Panel: Recent Labs  Lab 10/03/20 0500 10/07/20 0512 10/08/20 0617  NA 136 140 142  K 5.1 4.8 3.8  CL 98 95* 103  CO2 28 34* 30  GLUCOSE 124* 109* 94  BUN 35* 28* 30*  CREATININE 0.78 0.86 0.83  CALCIUM 8.8* 9.4 8.5*   GFR: Estimated Creatinine Clearance: 73 mL/min (by C-G formula  based on SCr of 0.83 mg/dL). Liver Function Tests: Recent Labs  Lab 10/07/20 0512 10/08/20 0617  AST 16 12*  ALT 28 22  ALKPHOS 62 58  BILITOT 1.4* 1.1  PROT 5.9* 5.4*  ALBUMIN 3.0* 3.0*   No results for input(s): LIPASE, AMYLASE in the last 168 hours. No results for input(s): AMMONIA in the last 168 hours. Coagulation Profile: No results for input(s): INR, PROTIME in the last 168 hours. Cardiac Enzymes: No results for input(s): CKTOTAL, CKMB, CKMBINDEX, TROPONINI in the last 168 hours. BNP (last 3 results) No results for input(s): PROBNP in the last 8760 hours. HbA1C: No results for input(s): HGBA1C in the last 72 hours. CBG: No results for input(s): GLUCAP in the last 168 hours. Lipid Profile: No results for input(s): CHOL, HDL, LDLCALC, TRIG, CHOLHDL, LDLDIRECT in the last 72 hours. Thyroid Function Tests: No results for input(s): TSH, T4TOTAL, FREET4, T3FREE, THYROIDAB in the last 72 hours. Anemia Panel: No results for  input(s): VITAMINB12, FOLATE, FERRITIN, TIBC, IRON, RETICCTPCT in the last 72 hours. Sepsis Labs: Recent Labs  Lab 10/04/20 1440 10/04/20 1702  LATICACIDVEN 1.6 1.0    Recent Results (from the past 240 hour(s))  SARS Coronavirus 2 by RT PCR (hospital order, performed in [facility] hospital lab) Nasopharyngeal Nasopharyngeal Swab     Status: None   Collection Time: 10/05/20  2:35 PM   Specimen: Nasopharyngeal Swab  Result Value Ref Range Status   SARS Coronavirus 2 NEGATIVE NEGATIVE Final    Comment: (NOTE) SARS-CoV-2 target nucleic acids are NOT DETECTED.  The SARS-CoV-2 RNA is generally detectable in upper and lower respiratory specimens during the acute phase of infection. The lowest concentration of SARS-CoV-2 viral copies this assay can detect is 250 copies / mL. A negative result does not preclude SARS-CoV-2 infection and should not be used as the sole basis for treatment or other patient management decisions.  A negative result may occur with improper specimen collection / handling, submission of specimen other than nasopharyngeal swab, presence of viral mutation(s) within the areas targeted by this assay, and inadequate number of viral copies (<250 copies / mL). A negative result must be combined with clinical observations, patient history, and epidemiological information.  Fact Sheet for Patients:   StrictlyIdeas.no  Fact Sheet for Healthcare Providers: BankingDealers.co.za  This test is not yet approved or  cleared by the [place] FDA and has been authorized for detection and/or diagnosis of SARS-CoV-2 by FDA under an Emergency Use Authorization (EUA).  This EUA will remain in effect (meaning this test can be used) for the duration of the COVID-19 declaration under Section 564(b)(1) of the Act, 21 U.S.C. section 360bbb-3(b)(1), unless the authorization is terminated or revoked sooner.  Performed at [facility], [place] [address], [place], [place] [address]   Culture, Urine     Status: Abnormal (Preliminary result)   Collection Time: 10/05/20 11:00 PM   Specimen: Urine, Random  Result Value Ref Range Status   Specimen Description   Final    URINE, RANDOM Performed at [place] [address] [place], [place] [address]    Special Requests   Final    NONE Performed at [facility], [place] [address], [place], [place] [address]    Culture (A)  Final    >=100,000 COLONIES/mL ESCHERICHIA COLI CULTURE REINCUBATED FOR BETTER GROWTH SUSCEPTIBILITIES TO FOLLOW Performed at [facility], [place] [address] [place], [place] [address]    Report  Status PENDING  Incomplete         Radiology Studies: No results found.      Scheduled Meds: . cholecalciferol  1,000 Units Oral Daily  . cyanocobalamin  1,000 mcg Intramuscular Daily   Followed by  . [START ON 10/13/2020] vitamin B-12  1,000 mcg Oral Daily  . folic acid  1 mg Oral Daily  . levothyroxine  75 mcg Oral QAC breakfast  . LORazepam  1 mg Intravenous Once  . oxyCODONE  10 mg Oral Q4H  . oxyCODONE  10 mg Oral Q12H  . pantoprazole  40 mg Oral BID AC  . polyethylene glycol  17 g Oral BID  . [START ON 10/10/2020] predniSONE  40 mg Oral Q breakfast  . pregabalin  75 mg Oral BID  . rivaroxaban  15 mg Oral BID WC   Followed by  . [START ON 10/19/2020] rivaroxaban  20 mg Oral Q supper  . senna-docusate  1 tablet Oral BID  . sertraline  200 mg Oral QPM   Continuous Infusions: . cefTRIAXone (ROCEPHIN)  IV 1 g (10/09/20 0917)  . ferumoxytol    . heparin 1,350 Units/hr (10/09/20 0646)     LOS: 12 days    Time spent: 35 minutes    Barb Merino, MD [facility] Pager [phone]

## 2020-10-09 NOTE — Progress Notes (Signed)
[place] for transition from IV UFH to rivaroxaban Indication: pulmonary embolus and DVT  Allergies  Allergen Reactions  . Prozac [Fluoxetine Hcl] Anaphylaxis  . Codeine Itching  . Morphine And Related Itching  . Sulfa Antibiotics Itching and Nausea Only    Patient Measurements: Height: 5\' 6"  (167.6 cm) Weight: 99.8 kg (220 lb) IBW/kg (Calculated) : 59.3 Heparin Dosing Weight: 82 kg  Vital Signs: Temp: 98.8 F (37.1 C) (10/19 0424) Temp Source: Oral (10/19 0424) BP: 111/58 (10/19 0424) Pulse Rate: 85 (10/19 0424)  Labs: Recent Labs    10/07/20 0512 10/07/20 0831 10/07/20 1402 10/07/20 1849 10/07/20 2201 10/08/20 0617 10/08/20 0617 10/08/20 1707 10/09/20 0359  HGB 8.2*   < >  --    < >  --  7.6*   < > 7.9* 7.8*  HCT 25.2*   < >  --    < >  --  23.6*  --  24.5* 24.2*  PLT 137*  --   --   --   --  127*  --   --  120*  APTT 56*   < > 87*  --  136*  --   --   --  39*  HEPARINUNFRC >2.20*  --   --   --   --   --   --   --  0.23*  CREATININE 0.86  --   --   --   --  0.83  --   --   --    < > = values in this interval not displayed.    Estimated Creatinine Clearance: 73 mL/min (by C-G formula based on SCr of 0.83 mg/dL).   Medical History: Past Medical History:  Diagnosis Date  . Allergic rhinitis   . Arthritis   . Bursitis    Both Shoulders  . Chronic low back pain   . Degenerative disk disease   . Depression   . Diverticulitis   . Emphysema of lung ([place])   . Fibromyalgia   . GERD (gastroesophageal reflux disease)   . History of fibromyalgia   . Hypertension   . Hypothyroidism   . Irritable bowel syndrome   . Neuralgia, post-herpetic   . Obesity, unspecified   . Obstructive sleep apnea   . OCD (obsessive compulsive disorder)   . Pain in joint, site unspecified   . Pure hypercholesterolemia   . Scoliosis   . Situational stress   . Spastic colon   . Tubular adenoma   . Uterine cancer ([place])     Medications:  Not on anticoagulation PTA  Anticoagulation Inpatient: -10/7 > 10/9 heparin drip -10/9 > 10/16 rivaroxaban 15 mg PO BID -10/16 > 10/19: heparin drip -10/19 > back to xarelto  Assessment: 74 y/o F with a h/o anal cancer with PE and DVT diagnosed this admission. GI evaluating for 5 point drop in hemoglobin possibly from GI source vs RPB. FOB+ and has had rectal bleeding. Pharmacy consulted to transition anticoagulation from xarelto to IV UFH.   Significant Events: -10/18: EGD and colonoscopy  10/09/2020:  CBC: Hgb (7.8) low but stable; Plt (120) low but stable  Ok to resume oral anticoagulation today per GI  SCr = 0.83, CrCl ~70 mL/min   Plan:   Stop heparin drip at time that xarelto given  Rivaroxaban 15 mg PO BID through 10/28 then transition to rivaroxaban 20 mg PO daily on 10/29  Will complete a total of 21 days of anticoagulation (IV UFH +  loading dose rivaroxaban) per discussion with MD   CBC with AM labs tomorrow  Lenis Noon, PharmD 10/09/20 1:25 PM

## 2020-10-09 NOTE — Progress Notes (Signed)
[place] for Heparin Indication: pulmonary embolus and DVT  Allergies  Allergen Reactions  . Prozac [Fluoxetine Hcl] Anaphylaxis  . Codeine Itching  . Morphine And Related Itching  . Sulfa Antibiotics Itching and Nausea Only    Patient Measurements: Height: 5\' 6"  (167.6 cm) Weight: 99.8 kg (220 lb) IBW/kg (Calculated) : 59.3 Heparin Dosing Weight: 82 kg  Vital Signs: Temp: 98.8 F (37.1 C) (10/19 0424) Temp Source: Oral (10/19 0424) BP: 111/58 (10/19 0424) Pulse Rate: 85 (10/19 0424)  Labs: Recent Labs    10/07/20 0512 10/07/20 0831 10/07/20 1402 10/07/20 1849 10/07/20 2201 10/08/20 0617 10/08/20 0617 10/08/20 1707 10/09/20 0359  HGB 8.2*   < >  --    < >  --  7.6*   < > 7.9* 7.8*  HCT 25.2*   < >  --    < >  --  23.6*  --  24.5* 24.2*  PLT 137*  --   --   --   --  127*  --   --  120*  APTT 56*   < > 87*  --  136*  --   --   --  39*  HEPARINUNFRC >2.20*  --   --   --   --   --   --   --  0.23*  CREATININE 0.86  --   --   --   --  0.83  --   --   --    < > = values in this interval not displayed.    Estimated Creatinine Clearance: 73 mL/min (by C-G formula based on SCr of 0.83 mg/dL).   Medical History: Past Medical History:  Diagnosis Date  . Allergic rhinitis   . Arthritis   . Bursitis    Both Shoulders  . Chronic low back pain   . Degenerative disk disease   . Depression   . Diverticulitis   . Emphysema of lung ([place])   . Fibromyalgia   . GERD (gastroesophageal reflux disease)   . History of fibromyalgia   . Hypertension   . Hypothyroidism   . Irritable bowel syndrome   . Neuralgia, post-herpetic   . Obesity, unspecified   . Obstructive sleep apnea   . OCD (obsessive compulsive disorder)   . Pain in joint, site unspecified   . Pure hypercholesterolemia   . Scoliosis   . Situational stress   . Spastic colon   . Tubular adenoma   . Uterine cancer (H[facility])     Medications:  Xarelto 15 mg BID >> 20 mg PO  daily initiated, completed 7.5 days transition to IV heparin on 10/16.  Assessment: 74 y/o F with a h/o anal cancer with PE and DVT diagnosed this admission. GI evaluating for 5 point drop in hemoglobin possibly from GI source vs RPB. FOB+ and has had rectal bleeding. Pharmacy consulted to transition anticoagulation from xarelto to IV UFH.   Significant Events: -10/18: EGD and colonoscopy - 10/19 MD consulted pharmacy to resume heparin at previous rate at 19:30 this evening  10/09/2020: HL 0.23, subtherapeutic APTT 39 subtherapeutic Hgb 7.8 Plts 120 No bleeding noted   Goal of Therapy:  HL 0.3 - 0.7 APTT 66-102 Monitor platelets by anticoagulation protocol: Yes   Per discussion with Dr. Lyndel Safe, will target lower end of therapeutic range (HL 0.3 - 0.5 and/or aPTT 66-84 seconds) and avoid heparin boluses.   Plan:   Increase heparin drip to 1350 units/hr  Check 8  hour HL/aPTT  CBC, HL daily (will check on heparin levels from here on out as they are correlating with aPTT)  Monitor for signs of bleeding  Dolly Rias RPh 10/09/2020, 6:04 AM

## 2020-10-10 ENCOUNTER — Encounter (HOSPITAL_COMMUNITY): Payer: Self-pay | Admitting: Gastroenterology

## 2020-10-10 DIAGNOSIS — M797 Fibromyalgia: Secondary | ICD-10-CM | POA: Diagnosis not present

## 2020-10-10 DIAGNOSIS — J9611 Chronic respiratory failure with hypoxia: Secondary | ICD-10-CM | POA: Diagnosis not present

## 2020-10-10 DIAGNOSIS — G4733 Obstructive sleep apnea (adult) (pediatric): Secondary | ICD-10-CM | POA: Diagnosis not present

## 2020-10-10 DIAGNOSIS — I2699 Other pulmonary embolism without acute cor pulmonale: Secondary | ICD-10-CM | POA: Diagnosis not present

## 2020-10-10 DIAGNOSIS — I2690 Septic pulmonary embolism without acute cor pulmonale: Secondary | ICD-10-CM

## 2020-10-10 DIAGNOSIS — E538 Deficiency of other specified B group vitamins: Secondary | ICD-10-CM | POA: Diagnosis not present

## 2020-10-10 DIAGNOSIS — Z743 Need for continuous supervision: Secondary | ICD-10-CM | POA: Diagnosis not present

## 2020-10-10 DIAGNOSIS — S72142D Displaced intertrochanteric fracture of left femur, subsequent encounter for closed fracture with routine healing: Secondary | ICD-10-CM | POA: Diagnosis not present

## 2020-10-10 DIAGNOSIS — K219 Gastro-esophageal reflux disease without esophagitis: Secondary | ICD-10-CM | POA: Diagnosis not present

## 2020-10-10 DIAGNOSIS — R600 Localized edema: Secondary | ICD-10-CM | POA: Diagnosis not present

## 2020-10-10 DIAGNOSIS — Z85048 Personal history of other malignant neoplasm of rectum, rectosigmoid junction, and anus: Secondary | ICD-10-CM | POA: Diagnosis not present

## 2020-10-10 DIAGNOSIS — R627 Adult failure to thrive: Secondary | ICD-10-CM | POA: Diagnosis not present

## 2020-10-10 DIAGNOSIS — M1990 Unspecified osteoarthritis, unspecified site: Secondary | ICD-10-CM | POA: Diagnosis not present

## 2020-10-10 DIAGNOSIS — I10 Essential (primary) hypertension: Secondary | ICD-10-CM | POA: Diagnosis not present

## 2020-10-10 DIAGNOSIS — E039 Hypothyroidism, unspecified: Secondary | ICD-10-CM | POA: Diagnosis not present

## 2020-10-10 DIAGNOSIS — Z7401 Bed confinement status: Secondary | ICD-10-CM | POA: Diagnosis not present

## 2020-10-10 DIAGNOSIS — J449 Chronic obstructive pulmonary disease, unspecified: Secondary | ICD-10-CM | POA: Diagnosis not present

## 2020-10-10 DIAGNOSIS — K5909 Other constipation: Secondary | ICD-10-CM | POA: Diagnosis not present

## 2020-10-10 DIAGNOSIS — D649 Anemia, unspecified: Secondary | ICD-10-CM | POA: Diagnosis not present

## 2020-10-10 DIAGNOSIS — R070 Pain in throat: Secondary | ICD-10-CM | POA: Diagnosis not present

## 2020-10-10 DIAGNOSIS — R269 Unspecified abnormalities of gait and mobility: Secondary | ICD-10-CM | POA: Diagnosis not present

## 2020-10-10 DIAGNOSIS — I82891 Chronic embolism and thrombosis of other specified veins: Secondary | ICD-10-CM | POA: Diagnosis not present

## 2020-10-10 DIAGNOSIS — K625 Hemorrhage of anus and rectum: Secondary | ICD-10-CM | POA: Diagnosis not present

## 2020-10-10 DIAGNOSIS — N390 Urinary tract infection, site not specified: Secondary | ICD-10-CM | POA: Diagnosis not present

## 2020-10-10 DIAGNOSIS — L97829 Non-pressure chronic ulcer of other part of left lower leg with unspecified severity: Secondary | ICD-10-CM | POA: Diagnosis not present

## 2020-10-10 DIAGNOSIS — R5381 Other malaise: Secondary | ICD-10-CM | POA: Diagnosis not present

## 2020-10-10 DIAGNOSIS — M2550 Pain in unspecified joint: Secondary | ICD-10-CM | POA: Diagnosis not present

## 2020-10-10 DIAGNOSIS — E785 Hyperlipidemia, unspecified: Secondary | ICD-10-CM | POA: Diagnosis not present

## 2020-10-10 DIAGNOSIS — D500 Iron deficiency anemia secondary to blood loss (chronic): Secondary | ICD-10-CM | POA: Diagnosis not present

## 2020-10-10 DIAGNOSIS — M6281 Muscle weakness (generalized): Secondary | ICD-10-CM | POA: Diagnosis not present

## 2020-10-10 DIAGNOSIS — R531 Weakness: Secondary | ICD-10-CM | POA: Diagnosis not present

## 2020-10-10 DIAGNOSIS — G894 Chronic pain syndrome: Secondary | ICD-10-CM | POA: Diagnosis not present

## 2020-10-10 DIAGNOSIS — R29898 Other symptoms and signs involving the musculoskeletal system: Secondary | ICD-10-CM | POA: Diagnosis not present

## 2020-10-10 LAB — CBC
HCT: 25.2 % — ABNORMAL LOW (ref 36.0–46.0)
Hemoglobin: 7.9 g/dL — ABNORMAL LOW (ref 12.0–15.0)
MCH: 33.5 pg (ref 26.0–34.0)
MCHC: 31.3 g/dL (ref 30.0–36.0)
MCV: 106.8 fL — ABNORMAL HIGH (ref 80.0–100.0)
Platelets: 147 10*3/uL — ABNORMAL LOW (ref 150–400)
RBC: 2.36 MIL/uL — ABNORMAL LOW (ref 3.87–5.11)
RDW: 15.7 % — ABNORMAL HIGH (ref 11.5–15.5)
WBC: 6 10*3/uL (ref 4.0–10.5)
nRBC: 0.8 % — ABNORMAL HIGH (ref 0.0–0.2)

## 2020-10-10 LAB — URINE CULTURE: Culture: >=100000 — AB

## 2020-10-10 LAB — SARS CORONAVIRUS 2 BY RT PCR (HOSPITAL ORDER, PERFORMED IN ~~LOC~~ HOSPITAL LAB): SARS Coronavirus 2: NEGATIVE

## 2020-10-10 MED ORDER — PREDNISONE 10 MG PO TABS: ORAL_TABLET | ORAL | 0 refills | Status: DC

## 2020-10-10 MED ORDER — PREGABALIN 75 MG PO CAPS
75.0000 mg | ORAL_CAPSULE | Freq: Two times a day (BID) | ORAL | 0 refills | Status: DC
Start: 2020-10-10 — End: 2021-01-14

## 2020-10-10 MED ORDER — RIVAROXABAN (XARELTO) VTE STARTER PACK (15 & 20 MG): ORAL_TABLET | ORAL | 0 refills | Status: DC

## 2020-10-10 MED ORDER — OXYCODONE HCL 10 MG PO TABS
10.0000 mg | ORAL_TABLET | ORAL | 0 refills | Status: DC
Start: 2020-10-10 — End: 2021-06-05

## 2020-10-10 MED ORDER — PREDNISONE 20 MG PO TABS
40.0000 mg | ORAL_TABLET | Freq: Every day | ORAL | 0 refills | Status: DC
Start: 2020-10-11 — End: 2020-10-10

## 2020-10-10 NOTE — Discharge Summary (Addendum)
Physician Discharge Summary  Joanne Klein DOB: [birth year] DOA: 09/27/2020  PCP: Hoyt Koch, MD  Admit date: 09/27/2020 Discharge date: 10/10/2020  Admitted From:home Disposition:  Nursing home Recommendations for Outpatient Follow-up:  1. Follow up with PCP in 1-2 weeks 2. 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  In the emergency room she was found hypoxic and tachycardic.  Work-up revealed submassive pulmonary embolism.  Hospitalization complicated by bilateral leg pain, ongoing weakness and drop in hemoglobin  Discharge Diagnoses:  Principal Problem:   Pulmonary embolism ([place]) Active Problems:   Depression   Hypothyroidism   GERD (gastroesophageal reflux disease)   COPD GOLD II    Hyperlipidemia   Morbid obesity ([place])   Anal cancer ([facility])   Chronic respiratory failure with hypoxia ([facility])   Weakness of both legs   Falls frequently   Heme positive stool   Acute blood loss anemia   History of anal cancer   History of pulmonary embolism   Acute PE submassive/right lower extremity DVT.  Echocardiogram without right ventricular strain: History of cancer, DVT and PE aggravated by immobility.  Cancer thought to be in remission. Continue Xarelto for at least 6 months.  Acute hypoxemic respiratory failure secondary to #1.  On minimum oxygen.  Anemia, acute on chronic.  Macrocytic anemia.  Low B12.  Folate acid was normal.  Continue B12 replacement on discharge.   Patient received Feraheme. Underwent EGD and colonoscopy EGD with few none active AVMs, one with scant oozing in the distal stomach treated with APC. Colonoscopy with no active bleeding, diverticulosis, radiation proctopathy. Hemoglobin has remained fairly stable since initial drop. Continue Protonix.   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 Recommended conservative management. To be followed by Dr. Krista Blue after discharge. Good response to steroid therapy, plan for prolonged taper. On prednisone 40 mg daily with plan to continue taper down 10 mg every week and follow-up with neurology outpatient.  Left greater trochanteric fracture: Seen by orthopedics.  Recommended conservative management and mobility.  Multiple falls: With progressive debility.  Work with PT OT.  Plan rehab at a SNF   Hypothyroidism: On Synthroid.  Depression: On Zoloft.  History of rectal cancer: Followed by Dr. Benay Spice.  Reportedly in remission.  Acute UTI present on admission: Growing E. coli.  Received Rocephin.  Urine culture grew E. coli sensitive to Rocephin.  Estimated body mass index is 35.51 kg/m as calculated from the following:   Height as of this encounter: 5\' 6"  (1.676 m).   Weight as of this encounter: 99.8 kg.  Discharge Instructions   Allergies as of 10/10/2020      Reactions   Prozac [fluoxetine Hcl] Anaphylaxis   Codeine Itching   Morphine And Related Itching   Sulfa Antibiotics Itching, Nausea Only      Medication List    STOP taking these medications   atorvastatin 20 MG tablet Commonly known as: LIPITOR   oxyCODONE-acetaminophen 10-325 MG tablet Commonly known as: PERCOCET   prochlorperazine 10 MG tablet Commonly known as: COMPAZINE     TAKE these medications   calcium carbonate 500 MG chewable tablet Commonly known as:  TUMS - dosed in mg elemental calcium Chew 1 tablet by mouth daily as needed for indigestion  or heartburn.   levothyroxine 75 MCG tablet Commonly known as: SYNTHROID TAKE 1 TABLET BY MOUTH  DAILY BEFORE BREAKFAST What changed: See the new instructions.   MIRALAX PO Take 17 g by mouth at bedtime.   omeprazole 40 MG capsule Commonly known as: PRILOSEC TAKE 1 CAPSULE BY MOUTH  DAILY   ondansetron 8 MG tablet Commonly known as: ZOFRAN Take 8 mg by mouth every 8 (eight) hours as needed for nausea or vomiting.   Oxycodone HCl 10 MG Tabs Take 1 tablet (10 mg total) by mouth every 4 (four) hours.   phenylephrine-shark liver oil-mineral oil-petrolatum 0.25-3-14-71.9 % rectal ointment Commonly known as: PREPARATION H Place 1 application rectally 2 (two) times daily as needed for hemorrhoids.   predniSONE 20 MG tablet Commonly known as: DELTASONE Take 2 tablets (40 mg total) by mouth daily with breakfast. Start taking on: October 11, 2020 What changed: how much to take   pregabalin 75 MG capsule Commonly known as: LYRICA Take 1 capsule (75 mg total) by mouth 2 (two) times daily.   Rivaroxaban Stater Pack (15 mg and 20 mg) Commonly known as: XARELTO STARTER PACK Follow package directions: Take one 15mg  tablet by mouth twice a day. On day 22, switch to one 20mg  tablet once a day. Take with food.   sertraline 100 MG tablet Commonly known as: ZOLOFT TAKE 2 TABLETS BY MOUTH AT  BEDTIME What changed: when to take this   Vitamin D-3 25 MCG (1000 UT) Caps Take 1,000 Units by mouth daily.       Contact information for follow-up providers    Hoyt Koch, MD Follow up.   Specialty: Internal Medicine Contact information: [place] [address] [phone]            Contact information for after-discharge care    Destination    [facility] Preferred SNF .   Service: Skilled Nursing Contact information: [address] [id number] [phone]                 Allergies  Allergen Reactions   . Prozac [Fluoxetine Hcl] Anaphylaxis  . Codeine Itching  . Morphine And Related Itching  . Sulfa Antibiotics Itching and Nausea Only    Consultations:  Ortho, oncology, neurology   Procedures/Studies: DG Knee 1-2 Views Left  Result Date: 09/30/2020 CLINICAL DATA:  Worsening bilateral patellar pain and bilateral posterior knee pain over the past 3 months EXAM: LEFT KNEE - 1-2 VIEW; RIGHT KNEE - 1-2 VIEW COMPARISON:  None. FINDINGS: Mild tricompartmental degenerative changes are noted of both knees, greatest within the medial compartment bilaterally. There is no acute displaced fracture or dislocation involving either knee. There is no significant joint effusion. 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 No acute displaced fracture or dislocation. Electronically Signed   By: Constance Holster M.D.   On: 09/30/2020 19:29   DG Knee 1-2 Views Right  Result Date: 09/30/2020 CLINICAL DATA:  Worsening bilateral patellar pain and bilateral posterior knee pain over the past 3 months EXAM: LEFT KNEE - 1-2 VIEW; RIGHT KNEE - 1-2 VIEW COMPARISON:  None. FINDINGS: Mild tricompartmental degenerative changes are noted of both knees, greatest within the medial compartment bilaterally. There is no acute displaced fracture or dislocation involving either knee. There is no significant joint effusion. IMPRESSION: Mild tricompartmental  degenerative changes of both knees, greatest within the medial compartment bilaterally. No acute displaced fracture or dislocation. Electronically Signed   By: Constance Holster M.D.   On: 09/30/2020 19:29   CT Angio Chest PE W and/or Wo Contrast  Result Date: 09/27/2020 CLINICAL DATA:  Weakness fell in bathroom EXAM: CT ANGIOGRAPHY CHEST WITH CONTRAST TECHNIQUE: Multidetector CT imaging of the chest was performed using the standard protocol during bolus administration of intravenous contrast.  Multiplanar CT image reconstructions and MIPs were obtained to evaluate the vascular anatomy. CONTRAST:  173mL OMNIPAQUE IOHEXOL 350 MG/ML SOLN COMPARISON:  Chest x-ray 09/27/2020, CT chest 10/12/2019 FINDINGS: Cardiovascular: Satisfactory opacification of the pulmonary arteries to the segmental level. Acute thrombus straddling left upper lobe pulmonary vessel to the descending left lower lobe pulmonary artery. Extension of thrombus into left upper and left lower lobe segmental vessels. No definitive right-sided emboli. Elevation of RV LV ratio which measures 1.3. Mild cardiomegaly. No pericardial effusion. Moderate aortic atherosclerosis. Ectatic ascending aorta without aneurysm. Mild coronary vascular calcification. No pericardial effusion. Mediastinum/Nodes: Midline trachea. Stable partially calcified subcentimeter nodule left lobe of thyroid, no further workup recommended. No suspicious adenopathy. Esophagus within normal limits. Lungs/Pleura: Moderate emphysema. No acute consolidation or effusion. No pneumothorax. Upper Abdomen: Calcified granuloma in the spleen. Status post cholecystectomy. No acute abnormality Musculoskeletal: No chest wall abnormality. No acute or significant osseous findings. Review of the MIP images confirms the above findings. IMPRESSION: 1. Positive for acute left-sided pulmonary emboli. Positive for acute PE with CT evidence of right heart strain (RV/LV Ratio = 1.3) consistent with at least submassive (intermediate risk) PE. The presence of right heart strain has been associated with an increased risk of morbidity and mortality. 2. Emphysema. No acute consolidation or effusion. 3. Emphysema and aortic atherosclerosis. Critical Value/emergent results were called by telephone at the time of interpretation on 09/27/2020 at 9:43 pm to provider [facility] , who verbally acknowledged these results. Aortic Atherosclerosis (ICD10-I70.0) and Emphysema (ICD10-J43.9). Electronically Signed    By: Donavan Foil M.D.   On: 09/27/2020 21:43   CT ABDOMEN PELVIS W CONTRAST  Result Date: 10/06/2020 CLINICAL DATA:  Left lower quadrant pain. Anemia. History of anal carcinoma. EXAM: CT ABDOMEN AND PELVIS WITH CONTRAST TECHNIQUE: Multidetector CT imaging of the abdomen and pelvis was performed using the standard protocol following bolus administration of intravenous contrast. CONTRAST:  128mL OMNIPAQUE IOHEXOL 300 MG/ML  SOLN COMPARISON:  12/27/2019 FINDINGS: Lower chest: No acute abnormality. Hepatobiliary: Liver normal in size and overall attenuation. 1 cm low-attenuation lesion in the peripheral right lobe, not fully characterized, but without significant change from the prior study. No other liver masses or lesions. Chronic intra and extrahepatic bile duct dilation similar to the prior CT. Status post cholecystectomy. Pancreas: Partial fatty replacement of the pancreas. No mass or inflammation. Spleen: Top-normal in size. Several small calcifications consistent with healed granuloma. No masses. Adrenals/Urinary Tract: No adrenal masses. Mild bilateral renal cortical thinning. Symmetric renal enhancement and excretion. No renal masses, stones or hydronephrosis. Normal ureters. Normal bladder. Stomach/Bowel: There are numerous colonic diverticula most prominent along the sigmoid colon. No evidence of diverticulitis. No colonic wall thickening or adjacent inflammation. Mild generalized increase in the colonic stool burden. Stomach is moderately distended but otherwise unremarkable. Small bowel is normal in caliber. No wall thickening or inflammation. Appendix not discretely seen but no evidence of appendicitis. Vascular/Lymphatic: Aortic atherosclerosis. No aneurysm. No enlarged lymph nodes. Reproductive: Status post hysterectomy. No adnexal masses. Other: Small fat containing paraumbilical hernia.  No ascites. Musculoskeletal: Fracture of the greater trochanter at the gluteal insertions, mildly comminuted,  displaced superiorly by 1.5 cm. This is new from the prior CT. There is no adjacent edema to suggest that this is acute, however. No other fractures. No bone lesions. IMPRESSION: 1. No acute abnormality within the abdomen or pelvis. There are numerous colonic diverticula, mostly along the sigmoid, but no evidence of diverticulitis. Mild generalized increased colonic stool burden. 2. Fracture of the greater trochanter of the left proximal femur, which is new from the prior CT, but which does not appear recent/acute. This warrants clinical correlation. 3. Chronic findings include mild intra and extrahepatic bile duct dilation, partial fatty replacement of the pancreas and aortic atherosclerosis. Electronically Signed   By: Lajean Manes M.D.   On: 10/06/2020 15:10   DG Chest Port 1 View  Result Date: 09/27/2020 CLINICAL DATA:  Weakness EXAM: PORTABLE CHEST 1 VIEW COMPARISON:  11/16/2019 FINDINGS: The heart size and mediastinal contours are within normal limits. Both lungs are clear. The visualized skeletal structures are unremarkable. Aortic atherosclerosis. IMPRESSION: No active disease. Electronically Signed   By: Donavan Foil M.D.   On: 09/27/2020 18:32   ECHOCARDIOGRAM COMPLETE  Result Date: 09/28/2020    ECHOCARDIOGRAM REPORT   Patient Name:   Joanne Klein Date of Exam: 09/28/2020 Medical Rec #:  [id number]       Height:       66.0 in Accession #:    [id number]      Weight:       220.0 lb Date of Birth:  07/04/1946        BSA:          2.082 m Patient Age:    47 years        BP:           157/85 mmHg Patient Gender: F               HR:           84 bpm. Exam Location:  Inpatient Procedure: 2D Echo, Cardiac Doppler, Color Doppler and Intracardiac            Opacification Agent Indications:    Pulmonary Embolus 415.19 / I26.99  History:        Patient has no prior history of Echocardiogram examinations.                 COPD; Risk Factors:Sleep Apnea, Dyslipidemia and Former Smoker.                 GERD.   Sonographer:    Vickie Epley RDCS Referring Phys: [id number] Julious Oka  Sonographer Comments: Suboptimal parasternal window. IMPRESSIONS  1. Left ventricular ejection fraction, by estimation, is 60 to 65%. The left ventricle has normal function. The left ventricle has no regional wall motion abnormalities. Left ventricular diastolic parameters are consistent with Grade I diastolic dysfunction (impaired relaxation).  2. Right ventricular systolic function is normal. The right ventricular size is normal.  3. The mitral valve was not well visualized. No evidence of mitral valve regurgitation.  4. The aortic valve is grossly normal. Aortic valve regurgitation is not visualized. No aortic stenosis is present. FINDINGS  Left Ventricle: Left ventricular ejection fraction, by estimation, is 60 to 65%. The left ventricle has normal function. The left ventricle has no regional wall motion abnormalities. Definity contrast agent was given IV to delineate the left ventricular  endocardial borders. The left ventricular internal cavity size  was normal in size. There is no left ventricular hypertrophy. Left ventricular diastolic parameters are consistent with Grade I diastolic dysfunction (impaired relaxation). Right Ventricle: The right ventricular size is normal. No increase in right ventricular wall thickness. Right ventricular systolic function is normal. Left Atrium: Left atrial size was normal in size. Right Atrium: Right atrial size was normal in size. Pericardium: There is no evidence of pericardial effusion. Mitral Valve: The mitral valve was not well visualized. No evidence of mitral valve regurgitation. Tricuspid Valve: The tricuspid valve is normal in structure. Tricuspid valve regurgitation is not demonstrated. Aortic Valve: The aortic valve is grossly normal. Aortic valve regurgitation is not visualized. No aortic stenosis is present. Pulmonic Valve: The pulmonic valve was normal in structure. Pulmonic valve  regurgitation is not visualized. Aorta: The aortic root was not well visualized. IAS/Shunts: The atrial septum is grossly normal.  LEFT VENTRICLE PLAX 2D LVOT diam:     2.20 cm     Diastology LV SV:         94          LV e' medial:    5.66 cm/s LV SV Index:   45          LV E/e' medial:  10.6 LVOT Area:     3.80 cm    LV e' lateral:   7.18 cm/s                            LV E/e' lateral: 8.4  LV Volumes (MOD) LV vol d, MOD A2C: 80.7 ml LV vol d, MOD A4C: 86.6 ml LV vol s, MOD A2C: 27.5 ml LV vol s, MOD A4C: 28.8 ml LV SV MOD A2C:     53.2 ml LV SV MOD A4C:     86.6 ml LV SV MOD BP:      56.3 ml RIGHT VENTRICLE RV S prime:     8.38 cm/s TAPSE (M-mode): 1.5 cm LEFT ATRIUM           Index       RIGHT ATRIUM           Index LA Vol (A2C): 19.1 ml 9.17 ml/m  RA Area:     11.20 cm LA Vol (A4C): 27.7 ml 13.30 ml/m RA Volume:   24.10 ml  11.57 ml/m  AORTIC VALVE LVOT Vmax:   140.00 cm/s LVOT Vmean:  90.400 cm/s LVOT VTI:    0.246 m  AORTA Ao Root diam: 3.20 cm MITRAL VALVE MV Area (PHT): 3.23 cm    SHUNTS MV Decel Time: 235 msec    Systemic VTI:  0.25 m MV E velocity: 60.10 cm/s  Systemic Diam: 2.20 cm MV A velocity: 75.70 cm/s MV E/A ratio:  0.79 Mertie Moores MD Electronically signed by Mertie Moores MD Signature Date/Time: 09/28/2020/2:09:58 PM    Final    DG HIP UNILAT WITH PELVIS 2-3 VIEWS LEFT  Result Date: 10/06/2020 CLINICAL DATA:  Fracture EXAM: DG HIP (WITH OR WITHOUT PELVIS) 2-3V LEFT COMPARISON:  Same day CT abdomen pelvis, hip radiograph, 07/29/2020 FINDINGS: Fracture fragment adjacent to the greater trochanter of the left femur, as seen on same day CT and not evidently changed compared to prior radiographs dated 07/29/2020. Osteopenia. The pelvis and proximal right femur are unremarkable. Excreted contrast in the urinary bladder. IMPRESSION: Fracture fragment adjacent to the greater trochanter of the left femur, as seen on same day CT and not evidently changed compared  to prior radiographs dated  07/29/2020. Electronically Signed   By: Eddie Candle M.D.   On: 10/06/2020 18:23   VAS [place] LOWER EXTREMITY VENOUS (DVT)  Result Date: 09/28/2020  Lower Venous DVTStudy Indications: Pulmonary embolism.  Anticoagulation: Heparin. Limitations: Poor ultrasound/tissue interface and bandages. Comparison Study: 10-13-2019 Prior bilateral lower extremity venous. Performing Technologist: Darlin Coco  Examination Guidelines: A complete evaluation includes B-mode imaging, spectral Doppler, color Doppler, and power Doppler as needed of all accessible portions of each vessel. Bilateral testing is considered an integral part of a complete examination. Limited examinations for reoccurring indications may be performed as noted. The reflux portion of the exam is performed with the patient in reverse Trendelenburg.  +---------+---------------+---------+-----------+----------+---------------+ RIGHT    CompressibilityPhasicitySpontaneityPropertiesThrombus Aging  +---------+---------------+---------+-----------+----------+---------------+ CFV      Full           Yes      Yes                                  +---------+---------------+---------+-----------+----------+---------------+ SFJ      Full                                                         +---------+---------------+---------+-----------+----------+---------------+ FV Prox  Full                                                         +---------+---------------+---------+-----------+----------+---------------+ FV Mid   Full                                                         +---------+---------------+---------+-----------+----------+---------------+ FV DistalFull                                                         +---------+---------------+---------+-----------+----------+---------------+ PFV      Full                                                          +---------+---------------+---------+-----------+----------+---------------+ POP      Full           Yes      Yes                                  +---------+---------------+---------+-----------+----------+---------------+ PTV      Partial                                                      +---------+---------------+---------+-----------+----------+---------------+  PERO                                                  Patent by color +---------+---------------+---------+-----------+----------+---------------+   +---------+---------------+---------+-----------+----------+-------------------+ LEFT     CompressibilityPhasicitySpontaneityPropertiesThrombus Aging      +---------+---------------+---------+-----------+----------+-------------------+ CFV      Full           Yes      Yes                                      +---------+---------------+---------+-----------+----------+-------------------+ SFJ      Full                                                             +---------+---------------+---------+-----------+----------+-------------------+ FV Prox  Full                                                             +---------+---------------+---------+-----------+----------+-------------------+ FV Mid   Full                                                             +---------+---------------+---------+-----------+----------+-------------------+ FV DistalFull                                                             +---------+---------------+---------+-----------+----------+-------------------+ PFV      Full                                                             +---------+---------------+---------+-----------+----------+-------------------+ POP      Full           Yes      Yes                                      +---------+---------------+---------+-----------+----------+-------------------+ PTV      Full                                          Some segments not  visualized due to                                                         bandages            +---------+---------------+---------+-----------+----------+-------------------+ PERO                                                  Patent by color-                                                          some segments not                                                         visualized due to                                                         bandages            +---------+---------------+---------+-----------+----------+-------------------+     Summary: RIGHT: - Findings consistent with age indeterminate deep vein thrombosis involving the right posterior tibial veins. - No cystic structure found in the popliteal fossa.  LEFT: - There is no evidence of deep vein thrombosis in the lower extremity.  - No cystic structure found in the popliteal fossa.  *See table(s) above for measurements and observations. Electronically signed by Servando Snare MD on 09/28/2020 at 5:34:50 PM.    Final     (Echo, Carotid, EGD, Colonoscopy, ERCP)    Subjective: Patient is resting in bed she is very concerned that she is not able to walk answer all her questions to the best of my ability by reviewing her chart  Discharge Exam: Vitals:   10/09/20 2142 10/10/20 0653  BP: 120/62 (!) 111/55  Pulse: 87 89  Resp: 16 16  Temp: 98.2 F (36.8 C) 98.8 F (37.1 C)  SpO2: 96% 94%   Vitals:   10/09/20 0424 10/09/20 1338 10/09/20 2142 10/10/20 0653  BP: (!) 111/58 113/62 120/62 (!) 111/55  Pulse: 85 86 87 89  Resp: 14 18 16 16   Temp: 98.8 F (37.1 C) 98.3 F (36.8 C) 98.2 F (36.8 C) 98.8 F (37.1 C)  TempSrc: Oral Oral Oral Oral  SpO2: 96% 94% 96% 94%  Weight:      Height:        General: Pt is alert, awake, not in acute distress Cardiovascular: RRR, S1/S2 +, no rubs, no  gallops Respiratory: CTA bilaterally, no wheezing, no rhonchi Abdominal: Soft, NT, ND, bowel sounds + Extremities: 1+ edema wiggles toes but unable to raise both legs up from bed   The results of  significant diagnostics from this hospitalization (including imaging, microbiology, ancillary and laboratory) are listed below for reference.     Microbiology: Recent Results (from the past 240 hour(s))  SARS Coronavirus 2 by RT PCR (hospital order, performed in [facility] hospital lab) Nasopharyngeal Nasopharyngeal Swab     Status: None   Collection Time: 10/05/20  2:35 PM   Specimen: Nasopharyngeal Swab  Result Value Ref Range Status   SARS Coronavirus 2 NEGATIVE NEGATIVE Final    Comment: (NOTE) SARS-CoV-2 target nucleic acids are NOT DETECTED.  The SARS-CoV-2 RNA is generally detectable in upper and lower respiratory specimens during the acute phase of infection. The lowest concentration of SARS-CoV-2 viral copies this assay can detect is 250 copies / mL. A negative result does not preclude SARS-CoV-2 infection and should not be used as the sole basis for treatment or other patient management decisions.  A negative result may occur with improper specimen collection / handling, submission of specimen other than nasopharyngeal swab, presence of viral mutation(s) within the areas targeted by this assay, and inadequate number of viral copies (<250 copies / mL). A negative result must be combined with clinical observations, patient history, and epidemiological information.  Fact Sheet for Patients:   StrictlyIdeas.no  Fact Sheet for Healthcare Providers: BankingDealers.co.za  This test is not yet approved or  cleared by the [place] FDA and has been authorized for detection and/or diagnosis of SARS-CoV-2 by FDA under an Emergency Use Authorization (EUA).  This EUA will remain in effect (meaning this test can be used) for the duration  of the COVID-19 declaration under Section 564(b)(1) of the Act, 21 U.S.C. section 360bbb-3(b)(1), unless the authorization is terminated or revoked sooner.  Performed at [facility], [place] [address] [place], [place] [address]   Culture, Urine     Status: Abnormal   Collection Time: 10/05/20 11:00 PM   Specimen: Urine, Random  Result Value Ref Range Status   Specimen Description   Final    URINE, RANDOM Performed at [place] [address] [place], [place] [address]    Special Requests   Final    NONE Performed at [facility] [place] [address], [place], [place] [address]    Culture >=100,000 COLONIES/mL ESCHERICHIA COLI (A)  Final   Report Status 10/10/2020 FINAL  Final   Organism ID, Bacteria ESCHERICHIA COLI (A)  Final      Susceptibility   Escherichia coli - MIC*    AMPICILLIN 8 SENSITIVE Sensitive     CEFAZOLIN <=4 SENSITIVE Sensitive     CEFTRIAXONE <=0.25 SENSITIVE Sensitive     CIPROFLOXACIN <=0.25 SENSITIVE Sensitive     GENTAMICIN <=1 SENSITIVE Sensitive     IMIPENEM <=0.25 SENSITIVE Sensitive     NITROFURANTOIN <=16 SENSITIVE Sensitive     TRIMETH/SULFA <=20 SENSITIVE Sensitive     AMPICILLIN/SULBACTAM <=2 SENSITIVE Sensitive     PIP/TAZO <=4 SENSITIVE Sensitive     * >=100,000 COLONIES/mL ESCHERICHIA COLI     Labs: BNP (last 3 results) No results for input(s): BNP in the last 8760 hours. Basic Metabolic Panel: Recent Labs  Lab 10/07/20 0512 10/08/20 0617  NA 140 142  K 4.8 3.8  CL 95* 103  CO2 34* 30  GLUCOSE 109* 94  BUN 28* 30*  CREATININE 0.86 0.83  CALCIUM 9.4 8.5*   Liver Function Tests: Recent Labs  Lab 10/07/20 0512 10/08/20 0617  AST 16 12*  ALT 28 22  ALKPHOS 62 58  BILITOT 1.4* 1.1  PROT 5.9* 5.4*  ALBUMIN 3.0* 3.0*   No results for input(s): LIPASE, AMYLASE in the last 168 hours. No results for input(s): AMMONIA in the last 168 hours. CBC: Recent Labs  Lab  10/05/20 1141 10/05/20 1823 10/07/20 0512 10/07/20 0831 10/07/20 1849 10/08/20 0617 10/08/20 1707 10/09/20 0359 10/10/20 0425  WBC 10.0  --  7.3  --   --  6.5  --  7.3 6.0  HGB 8.6*   < > 8.2*   < > 8.6* 7.6* 7.9* 7.8* 7.9*  HCT 25.9*   < > 25.2*   < > 26.0* 23.6* 24.5* 24.2* 25.2*  MCV 100.4*  --  102.0*  --   --  104.4*  --  103.9* 106.8*  PLT 143*  --  137*  --   --  127*  --  120* 147*   < > = values in this interval not displayed.   Cardiac Enzymes: No results for input(s): CKTOTAL, CKMB, CKMBINDEX, TROPONINI in the last 168 hours. BNP: Invalid input(s): POCBNP CBG: No results for input(s): GLUCAP in the last 168 hours. D-Dimer No results for input(s): DDIMER in the last 72 hours. Hgb A1c No results for input(s): HGBA1C in the last 72 hours. Lipid Profile No results for input(s): CHOL, HDL, LDLCALC, TRIG, CHOLHDL, LDLDIRECT in the last 72 hours. Thyroid function studies No results for input(s): TSH, T4TOTAL, T3FREE, THYROIDAB in the last 72 hours.  Invalid input(s): FREET3 Anemia work up No results for input(s): VITAMINB12, FOLATE, FERRITIN, TIBC, IRON, RETICCTPCT in the last 72 hours. Urinalysis    Component Value Date/Time   COLORURINE YELLOW 10/05/2020 2300   APPEARANCEUR CLEAR 10/05/2020 2300   LABSPEC 1.019 10/05/2020 2300   PHURINE 5.0 10/05/2020 2300   GLUCOSEU NEGATIVE 10/05/2020 2300   HGBUR NEGATIVE 10/05/2020 2300   BILIRUBINUR NEGATIVE 10/05/2020 2300   KETONESUR NEGATIVE 10/05/2020 2300   PROTEINUR NEGATIVE 10/05/2020 2300   UROBILINOGEN 1.0 04/20/2008 0800   NITRITE NEGATIVE 10/05/2020 2300   LEUKOCYTESUR TRACE (A) 10/05/2020 2300   Sepsis Labs Invalid input(s): PROCALCITONIN,  WBC,  LACTICIDVEN Microbiology Recent Results (from the past 240 hour(s))  SARS Coronavirus 2 by RT PCR (hospital order, performed in [place] hospital lab) Nasopharyngeal Nasopharyngeal Swab     Status: None   Collection Time: 10/05/20  2:35 PM   Specimen:  Nasopharyngeal Swab  Result Value Ref Range Status   SARS Coronavirus 2 NEGATIVE NEGATIVE Final    Comment: (NOTE) SARS-CoV-2 target nucleic acids are NOT DETECTED.  The SARS-CoV-2 RNA is generally detectable in upper and lower respiratory specimens during the acute phase of infection. The lowest concentration of SARS-CoV-2 viral copies this assay can detect is 250 copies / mL. A negative result does not preclude SARS-CoV-2 infection and should not be used as the sole basis for treatment or other patient management decisions.  A negative result may occur with improper specimen collection / handling, submission of specimen other than nasopharyngeal swab, presence of viral mutation(s) within the areas targeted by this assay, and inadequate number of viral copies (<250 copies / mL). A negative result must be combined with clinical observations, patient history, and epidemiological information.  Fact Sheet for Patients:   StrictlyIdeas.no  Fact Sheet for Healthcare Providers: BankingDealers.co.za  This test is not yet approved or  cleared by the [place] FDA and has been authorized for detection and/or diagnosis of SARS-CoV-2 by FDA under an Emergency Use Authorization (EUA).  This EUA will remain in  effect (meaning this test can be used) for the duration of the COVID-19 declaration under Section 564(b)(1) of the Act, 21 U.S.C. section 360bbb-3(b)(1), unless the authorization is terminated or revoked sooner.  Performed at [facility], [place] [address], [place], [place] [address]   Culture, Urine     Status: Abnormal   Collection Time: 10/05/20 11:00 PM   Specimen: Urine, Random  Result Value Ref Range Status   Specimen Description   Final    URINE, RANDOM Performed at [place] [address], [place], [place] [address]    Special Requests   Final    NONE Performed at [facility], [place] [address], [place], [place] [address]    Culture >=100,000 COLONIES/mL ESCHERICHIA COLI (A)  Final   Report Status 10/10/2020 FINAL  Final   Organism ID, Bacteria ESCHERICHIA COLI (A)  Final      Susceptibility   Escherichia coli - MIC*    AMPICILLIN 8 SENSITIVE Sensitive     CEFAZOLIN <=4 SENSITIVE Sensitive     CEFTRIAXONE <=0.25 SENSITIVE Sensitive     CIPROFLOXACIN <=0.25 SENSITIVE Sensitive     GENTAMICIN <=1 SENSITIVE Sensitive     IMIPENEM <=0.25 SENSITIVE Sensitive     NITROFURANTOIN <=16 SENSITIVE Sensitive     TRIMETH/SULFA <=20 SENSITIVE Sensitive     AMPICILLIN/SULBACTAM <=2 SENSITIVE Sensitive     PIP/TAZO <=4 SENSITIVE Sensitive     * >=100,000 COLONIES/mL ESCHERICHIA COLI     Time coordinating discharge:  39 minutes  SIGNED:   Georgette Shell, MD  [facility] 10/10/2020, 10:52 AM

## 2020-10-10 NOTE — Progress Notes (Signed)
Physical Therapy Treatment Patient Details Name: Joanne Klein MRN: [id number] DOB: 21-Jan-1946 Today's Date: 10/10/2020    History of Present Illness 74 y.o. female with medical history significant of rectal cancer status post chemotherapy and radiation therapy who developed significant myopathy following treatment, and has significant lower extremity weakness, neuralgia, known degenerative disc disease of the lumbar spine, GERD, hypertension, fibromyalgia, overall weakness with poor mobility, morbid obesity as well as obstructive sleep apnea, hypertension, hypothyroidism, who presented to the hospital with worsening weakness as well as another fall.  Pt admitted with failure to thrive at home due to weakness and found to have PE    PT Comments    The  Patient is eager to participate in LE exercises and sitting on bed edge x 10 minutes/ Patient reports the pain of left leg is less painful to touch but remains severly weak throughout on both legs.Recommend SNF.   Follow Up Recommendations  SNF     Equipment Recommendations  None recommended by PT    Recommendations for Other Services       Precautions / Restrictions Precautions Precautions: Fall Precaution Comments: very tender LEs, profound leg weakness,  needs mechanical lift    Mobility  Bed Mobility   Bed Mobility: Rolling;Sidelying to Sit;Sit to Supine Rolling: Mod assist Sidelying to sit: Max assist   Sit to supine: Max assist   General bed mobility comments: patient assisting with arms to roll, requires assistance with both legs, able to slightly flex the right knee. Patient rolled to each side. patient required max assist to sit upright , using bed pad. Patient required max assist for both legs back onto bed.. patient able to ues arms to assist sliding up in the bed.  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 left lower leg Pain Descriptors / Indicators: Tender;Sore Pain Intervention(s): Monitored during session    Home Living                      Prior Function            PT Goals (current goals can now be found in the care plan section) Progress towards PT goals:  Progressing toward goals    Frequency    Min 2X/week      PT Plan Current plan remains appropriate    Co-evaluation              AM-PAC PT "6 Clicks" Mobility   Outcome Measure  Help needed turning from your back to your side while in a flat bed without using bedrails?: A Lot Help needed moving from lying on your back to sitting on the side of a flat bed without using bedrails?: A Lot Help needed moving to and from a bed to a chair (including a wheelchair)?: Total Help needed standing up from a chair using your arms (e.g., wheelchair or bedside chair)?: Total Help needed to walk in hospital room?: Total Help needed climbing 3-5 steps with a railing? : Total 6 Click Score: 8    End of Session   Activity Tolerance: Patient tolerated treatment well Patient left: in bed;with  call bell/phone within reach;with bed alarm set Nurse Communication: Mobility status;Need for lift equipment PT Visit Diagnosis: Other abnormalities of gait and mobility (R26.89)     Time: 5456-2563 PT Time Calculation (min) (ACUTE ONLY): 41 min  Charges:  $Therapeutic Activity: 38-52 mins                     Tresa Endo PT Acute Rehabilitation Services Pager [phone] Office [phone]    Claretha Cooper 10/10/2020, 2:20 PM

## 2020-10-10 NOTE — Progress Notes (Signed)
Gave report to the nurse at [facility], All questions answered.

## 2020-10-10 NOTE — TOC Progression Note (Addendum)
Transition of Care [facility]) - Progression Note    Patient Details  Name: Joanne Klein MRN: [id number] Date of Birth: [birth year]  [facility] CM/SW Contact  Purcell Mouton, RN Phone Number: 10/10/2020, 3:36 PM  Clinical Narrative:     Pt was make aware that she will go to [facility] today. Insurance approved and pt is COVID negative.  PTAR was called.   Expected Discharge Plan: [place] Barriers to Discharge: Continued Medical Work up  Expected Discharge Plan and Services Expected Discharge Plan: [place] In-house Referral: Clinical Social Work Discharge Planning Services: CM Consult Post Acute Care Choice: [place] Living arrangements for the past 2 months: Single Family Home Expected Discharge Date: 10/10/20                                     Social Determinants of Health (SDOH) Interventions    Readmission Risk Interventions Readmission Risk Prevention Plan 06/22/2020  Transportation Screening Complete  PCP or Specialist Appt within 5-7 Days Not Complete  Not Complete comments disposition pending  Home Care Screening Complete  Medication Review (RN CM) Referral to Pharmacy  Some recent data might be hidden

## 2020-10-10 NOTE — Plan of Care (Signed)

## 2020-10-10 NOTE — TOC Progression Note (Signed)
Transition of Care [facility]) - Progression Note    Patient Details  Name: Joanne Klein MRN: [id number] Date of Birth: August 11, 1946  Transition of Care [facility]) CM/SW Contact  Purcell Mouton, RN Phone Number: 10/10/2020, 10:22 AM  Clinical Narrative:    [facility] called to ask for this pt. [place] will get insurance auth. Will need rapid COVID test. MD is aware.    Expected Discharge Plan: [place] Barriers to Discharge: Continued Medical Work up  Expected Discharge Plan and Services Expected Discharge Plan: [place] In-house Referral: Clinical Social Work Discharge Planning Services: CM Consult Post Acute Care Choice: [place] Living arrangements for the past 2 months: Single Family Home                                       Social Determinants of Health (SDOH) Interventions    Readmission Risk Interventions Readmission Risk Prevention Plan 06/22/2020  Transportation Screening Complete  PCP or Specialist Appt within 5-7 Days Not Complete  Not Complete comments disposition pending  Home Care Screening Complete  Medication Review (RN CM) Referral to Pharmacy  Some recent data might be hidden

## 2020-10-11 ENCOUNTER — Encounter: Payer: Self-pay | Admitting: Gastroenterology

## 2020-10-12 DIAGNOSIS — G4733 Obstructive sleep apnea (adult) (pediatric): Secondary | ICD-10-CM | POA: Diagnosis not present

## 2020-10-12 DIAGNOSIS — D500 Iron deficiency anemia secondary to blood loss (chronic): Secondary | ICD-10-CM | POA: Diagnosis not present

## 2020-10-12 DIAGNOSIS — J9611 Chronic respiratory failure with hypoxia: Secondary | ICD-10-CM | POA: Diagnosis not present

## 2020-10-12 DIAGNOSIS — I2699 Other pulmonary embolism without acute cor pulmonale: Secondary | ICD-10-CM | POA: Diagnosis not present

## 2020-10-18 DIAGNOSIS — L97829 Non-pressure chronic ulcer of other part of left lower leg with unspecified severity: Secondary | ICD-10-CM | POA: Diagnosis not present

## 2020-10-23 ENCOUNTER — Telehealth: Payer: Self-pay | Admitting: *Deleted

## 2020-10-23 ENCOUNTER — Encounter: Payer: Self-pay | Admitting: Neurology

## 2020-10-23 ENCOUNTER — Ambulatory Visit: Payer: Self-pay | Admitting: Neurology

## 2020-10-23 NOTE — Telephone Encounter (Signed)
No showed new patient appointment. 

## 2020-10-24 ENCOUNTER — Telehealth: Payer: Self-pay | Admitting: Internal Medicine

## 2020-10-24 NOTE — Telephone Encounter (Signed)
    [facility] calling to verify if Dr Sharlet Salina, upon return will sign home health orders. Phone [phone]

## 2020-10-24 NOTE — Telephone Encounter (Signed)
Called [place], at [facility], LVM. Pt needs an OV. [facility] has been informed of this multiple times and the reason why orders have been denied by PCP.

## 2020-10-25 ENCOUNTER — Ambulatory Visit: Payer: Medicare Other | Admitting: Neurology

## 2020-10-25 ENCOUNTER — Encounter: Payer: Self-pay | Admitting: Neurology

## 2020-10-25 VITALS — BP 130/72 | HR 94

## 2020-10-25 DIAGNOSIS — R269 Unspecified abnormalities of gait and mobility: Secondary | ICD-10-CM

## 2020-10-25 DIAGNOSIS — E538 Deficiency of other specified B group vitamins: Secondary | ICD-10-CM | POA: Insufficient documentation

## 2020-10-25 DIAGNOSIS — M797 Fibromyalgia: Secondary | ICD-10-CM | POA: Diagnosis not present

## 2020-10-25 DIAGNOSIS — R627 Adult failure to thrive: Secondary | ICD-10-CM | POA: Diagnosis not present

## 2020-10-25 DIAGNOSIS — L97829 Non-pressure chronic ulcer of other part of left lower leg with unspecified severity: Secondary | ICD-10-CM | POA: Diagnosis not present

## 2020-10-25 DIAGNOSIS — R531 Weakness: Secondary | ICD-10-CM

## 2020-10-25 DIAGNOSIS — R600 Localized edema: Secondary | ICD-10-CM | POA: Diagnosis not present

## 2020-10-25 DIAGNOSIS — M6281 Muscle weakness (generalized): Secondary | ICD-10-CM | POA: Diagnosis not present

## 2020-10-25 DIAGNOSIS — G894 Chronic pain syndrome: Secondary | ICD-10-CM | POA: Diagnosis not present

## 2020-10-25 MED ORDER — DULOXETINE HCL 60 MG PO CPEP: 60.0000 mg | ORAL_CAPSULE | Freq: Every day | ORAL | 6 refills | Status: DC

## 2020-10-25 NOTE — Progress Notes (Signed)
Chief Complaint  Patient presents with  . New Patient (Initial Visit)    She is here with her neice, Joanne Klein. She is currently in rehab at [facility]. She normally lives at home alone. Referral for bilateral lower extremity weakness. Neuro hospitalist recommended this patient to follow up with you to consider NCV/ EMG and muscle biopsy.   Marland Kitchen PCP    Joanne Koch, MD (referred from hospital)    HISTORICAL  Joanne Klein is a 74 year old female, seen in request by her primary care physician Joanne Klein for evaluation of lower extremity weakness, she is accompanied by her niece Joanne Klein at today's visit on October 25, 2020  I reviewed and summarized the referring note.  Past medical history Hypertension Morbid obesity Obstructive sleep apnea Hypothyroidism Depression, anxiety, zoloft 223m daily Submassive pulmonary emboli October 7-20, 2021 Rectal cancer, status post chemo and radiation therapy  Patient was diagnosed with squamous cell carcinoma of anal margin, was treated with chemo and radiation therapy, also had a history of uterine cancer, she was treated with chemo and radiation therapy, during the process, she was developed severe pancytopenia, neutropenia, epistaxis, rectal bleeding, required blood transfusion, parameter increased with Granix, she did require hospital admission in November 2020, she was able to ambulate without assistant prior to early 2021.  Since then, she had multiple emergency room presentation for variable reasons, syncope, falling, constipation, back pain, weakness, right ankle injury with right lateral malleolus oblique fracture on May 26, 2020,  Patient reported that she has finished her treatment in January 2021, since beginning chemoradiation therapy for rectal cancer in September 2020, she has developed generalized weakness, but was able to get around with walker,  Around May 2021, she noticed gradually  worsening functional status, she complains of significant pain behind her knees, could not tolerate touching, bearing weight, has difficulty moving around even with her walker, she eventually suffered right ankle fracture on May 26, 2020 following a fall accident, had rapid decline in functional status since then, could barely move her lower extremity, could not weightbearing, stayed in rehabilitation since.  She has bowel incontinence following radiaion, now wear pull up, she would stand up, she can clean herself up.   She was admitted to the hospital on October 7-20, 2021, patient developed hypoxia, tachycardia, CT chest angiogram revealed acute left-sided pulmonary emboli, right heart strain, emphysema, and aortic atherosclerosis  Ultrasound of lower extremity revealed age-indeterminate deep venous thrombosis involving right posterior tibial veins, no evidence of left lower extremity DVT  The decision was to be treated with the Xarelto for at least 6 months,  For lower extremity weakness, she was treated with prednisone 40 mg for pain and the the possibility of myositis per radiology report, continue to taper down to 10 mg every week,  Hospital admission on June 29 through June 23, 2020 for lower extremity weakness,  She presented with multiple falls, complains knee locking, numbness tingling at her feet, sharp pain behind her knees,  Personally reviewed MRI of pelvic spine with without contrast, mild but new presacral edema, edema tracking along within the iliac muscles, piriformis muscles, central gluteus muscles, bilateral hip adductor musculature in a symmetric fashion, this is also accompanied by a low-grade enhancement, radiology reads the possibility of association with prior area radiation therapy, versus myositis,  MRI of cervical spine with without contrast mild degenerative changes, no significant canal or foraminal stenosis.  MRI of lumbar spine, thoracic spine, no significant  abnormality  MRI of brain, atrophy, mild supratentorium small vessel disease, chronic microhemorrhage right posterior frontal white matter  Laboratory evaluation showed hemoglobin of 7.9, normal CMP, with exception of decreased albumin 3.0, total protein of 5.4, normal folic acid, vitamin W43, protein electrophoresis, anti-DNA antibody, ANA, uric acid, rheumatoid factor, aldolase, ESR,  CPK level was never elevated, in the range of 34-85,  Laboratory evaluations in 2021, hemoglobin of 7.9, MCH of 106.8, RDW of 15.7, CMP showed albumin of 3.0, calcium of 8.5, total protein of 5.4, X54 008, folic acid 6.7,YPP 50.9, Negative ANA, C-reactive protein was mildly elevated 6.4, rheumatoid factor was less than 10, ESR was mildly elevated 44, aldolase was normal 4.8, CPK was only 34,  REVIEW OF SYSTEMS: Full 14 system review of systems performed and notable only for as above All other review of systems were negative.  ALLERGIES: Allergies  Allergen Reactions  . Prozac [Fluoxetine Hcl] Anaphylaxis  . Codeine Itching  . Morphine And Related Itching  . Sulfa Antibiotics Itching and Nausea Only    HOME MEDICATIONS: Current Outpatient Medications  Medication Sig Dispense Refill  . calcium carbonate (TUMS - DOSED IN MG ELEMENTAL CALCIUM) 500 MG chewable tablet Chew 1 tablet by mouth daily as needed for indigestion or heartburn.     . Cholecalciferol (VITAMIN D-3) 1000 UNITS CAPS Take 1,000 Units by mouth daily.     Marland Kitchen levothyroxine (SYNTHROID) 75 MCG tablet TAKE 1 TABLET BY MOUTH  DAILY BEFORE BREAKFAST (Patient taking differently: Take 75 mcg by mouth daily before breakfast. ) 90 tablet 3  . omeprazole (PRILOSEC) 40 MG capsule TAKE 1 CAPSULE BY MOUTH  DAILY (Patient taking differently: Take 40 mg by mouth daily. ) 90 capsule 3  . ondansetron (ZOFRAN) 8 MG tablet Take 8 mg by mouth every 8 (eight) hours as needed for nausea or vomiting.    Marland Kitchen oxyCODONE 10 MG TABS Take 1 tablet (10 mg total) by mouth  every 4 (four) hours. 30 tablet 0  . phenylephrine-shark liver oil-mineral oil-petrolatum (PREPARATION H) 0.25-3-14-71.9 % rectal ointment Place 1 application rectally 2 (two) times daily as needed for hemorrhoids.    . Polyethylene Glycol 3350 (MIRALAX PO) Take 17 g by mouth at bedtime.     . predniSONE (DELTASONE) 10 MG tablet Take 4 tablets daily for 5 days Then 3 tablets daily for 5 days Then 2 tablets daily for 5 days Then 1 tablet daily till all the tablets are done 60 tablet 0  . pregabalin (LYRICA) 75 MG capsule Take 1 capsule (75 mg total) by mouth 2 (two) times daily. 60 capsule 0  . RIVAROXABAN (XARELTO) VTE STARTER PACK (15 & 20 MG TABLETS) Follow package directions: Take one $Remove'15mg'[id number]$  tablet by mouth twice a day till October 18, 2020 then start taking 1 tablet daily. Take with food. 16 each 0  . sertraline (ZOLOFT) 100 MG tablet TAKE 2 TABLETS BY MOUTH AT  BEDTIME (Patient taking differently: Take 200 mg by mouth every evening. ) 180 tablet 3   No current facility-administered medications for this visit.    PAST MEDICAL HISTORY: Past Medical History:  Diagnosis Date  . Allergic rhinitis   . Arthritis   . Bursitis    Both Shoulders  . Chronic low back pain   . Degenerative disk disease   . Depression   . Diverticulitis   . Emphysema of lung ([place])   . Fibromyalgia   . GERD (gastroesophageal reflux disease)   . History of fibromyalgia   .  Hypertension   . Hypothyroidism   . Irritable bowel syndrome   . Neuralgia, post-herpetic   . Obesity, unspecified   . Obstructive sleep apnea   . OCD (obsessive compulsive disorder)   . Pain in joint, site unspecified   . Pure hypercholesterolemia   . Scoliosis   . Situational stress   . Spastic colon   . Tubular adenoma   . Uterine cancer ([place])     PAST SURGICAL HISTORY: Past Surgical History:  Procedure Laterality Date  . BIOPSY  10/08/2020   Procedure: BIOPSY;  Surgeon: Jackquline Denmark, MD;  Location: [facility];  Service:  Gastroenterology;;  . BREAST LUMPECTOMY     knot removed  . CHOLECYSTECTOMY    . COLONOSCOPY WITH PROPOFOL N/A 10/08/2020   Procedure: COLONOSCOPY WITH PROPOFOL;  Surgeon: Jackquline Denmark, MD;  Location: [facility];  Service: Gastroenterology;  Laterality: N/A;  . ESOPHAGOGASTRODUODENOSCOPY (EGD) WITH PROPOFOL N/A 10/08/2020   Procedure: ESOPHAGOGASTRODUODENOSCOPY (EGD) WITH PROPOFOL;  Surgeon: Jackquline Denmark, MD;  Location: [facility];  Service: Gastroenterology;  Laterality: N/A;  . HOT HEMOSTASIS N/A 10/08/2020   Procedure: HOT HEMOSTASIS (ARGON PLASMA COAGULATION/BICAP);  Surgeon: Jackquline Denmark, MD;  Location: [facility];  Service: Gastroenterology;  Laterality: N/A;  . HYSTEROTOMY    . POLYPECTOMY  10/08/2020   Procedure: POLYPECTOMY;  Surgeon: Jackquline Denmark, MD;  Location: [facility];  Service: Gastroenterology;;  . RADIOLOGY WITH ANESTHESIA Bilateral 06/14/2020   Procedure: MRI WITH ANESTHESIA;  Surgeon: Radiologist, Medication, MD;  Location: [facility];  Service: Radiology;  Laterality: Bilateral;  . RADIOLOGY WITH ANESTHESIA N/A 06/21/2020   Procedure: MRI WITH ANESTHESIA;  Surgeon: Radiologist, Medication, MD;  Location: [facility];  Service: Radiology;  Laterality: N/A;  . TONSILLECTOMY    . tumor in left forearm      FAMILY HISTORY: Family History  Problem Relation Age of Onset  . Stomach cancer Father   . Liver cancer Father   . Diabetes Father   . Other Mother        unsure of medical history  . Colon cancer Neg Hx     SOCIAL HISTORY: Social History   Socioeconomic History  . Marital status: Divorced    Spouse name: Not on file  . Number of children: 1  . Years of education: some college  . Highest education level: Not on file  Occupational History  . Occupation: retired  Tobacco Use  . Smoking status: Former Smoker    Packs/day: 2.00    Years: 34.00    Pack years: 68.00    Types: Cigarettes    Quit date: 12/15/1996    Years since quitting: 23.8  . Smokeless  tobacco: Never Used  Vaping Use  . Vaping Use: Every day  . Substances: Nicotine  Substance and Sexual Activity  . Alcohol use: Not Currently  . Drug use: No  . Sexual activity: Not on file  Other Topics Concern  . Not on file  Social History Narrative   Lives alone.   Right-handed.    Two cans of Pepsi per day.   Social Determinants of Health   Financial Resource Strain:   . Difficulty of Paying Living Expenses: Not on file  Food Insecurity:   . Worried About Charity fundraiser in the Last Year: Not on file  . Ran Out of Food in the Last Year: Not on file  Transportation Needs: No Transportation Needs  . Lack of Transportation (Medical): No  . Lack of Transportation (Non-Medical): No  Physical  Activity:   . Days of Exercise per Week: Not on file  . Minutes of Exercise per Session: Not on file  Stress:   . Feeling of Stress : Not on file  Social Connections:   . Frequency of Communication with Friends and Family: Not on file  . Frequency of Social Gatherings with Friends and Family: Not on file  . Attends Religious Services: Not on file  . Active Member of Clubs or Organizations: Not on file  . Attends Archivist Meetings: Not on file  . Marital Status: Not on file  Intimate Partner Violence:   . Fear of Current or Ex-Partner: Not on file  . Emotionally Abused: Not on file  . Physically Abused: Not on file  . Sexually Abused: Not on file     PHYSICAL EXAM   Vitals:   10/25/20 0942  BP: 130/72  Pulse: 94   Not recorded     There is no height or weight on file to calculate BMI.  PHYSICAL EXAMNIATION:  Gen: NAD, conversant, well nourised, well groomed                     Cardiovascular: Regular rate rhythm, no peripheral edema, warm, nontender. Eyes: Conjunctivae clear without exudates or hemorrhage Neck: Supple, no carotid bruits. Pulmonary: Clear to auscultation bilaterally   NEUROLOGICAL EXAM:  MENTAL STATUS: Speech:    Speech is  normal; fluent and spontaneous with normal comprehension.  Cognition:     Orientation to time, place and person     Normal recent and remote memory     Normal Attention span and concentration     Normal Language, naming, repeating,spontaneous speech     Fund of knowledge   CRANIAL NERVES: CN II: Visual fields are full to confrontation. Pupils are round equal and briskly reactive to light. CN III, IV, VI: extraocular movement are normal. No ptosis. CN V: Facial sensation is intact to light touch CN VII: Face is symmetric with normal eye closure  CN VIII: Hearing is normal to causal conversation. CN IX, X: Phonation is normal. CN XI: Head turning and shoulder shrug are intact  MOTOR: Bilateral upper extremity were normal. Trace movement of bilateral upper and lower extremity, not antigravity.  REFLEXES: Reflexes are 1 and symmetric at the biceps, triceps, absent at knees and ankles. Plantar responses are flexor.  SENSORY: Intact to light touch, pinprick and vibratory sensation are intact in fingers and toes.  COORDINATION: There is no trunk or limb dysmetria noted.  GAIT/STANCE: Deferred   DIAGNOSTIC DATA (LABS, IMAGING, TESTING) - I reviewed patient records, labs, notes, testing and imaging myself where available.   ASSESSMENT AND PLAN  Joanne Klein is a 74 y.o. female   Bilateral lower extremity weakness, gait abnormality,  Following chemotherapy radiation therapy for rectal cancer at the end of 2020,  But continued slow worsening since May 2021,  CPK has always been within normal limit, despite described proximal muscle signal abnormality on MRI pelvic with without contrast, raise the possibility of radiation induced changes versus myositis,  Her complaints of significant bilateral knee pain bilateral lower extremity pain likely play a major role no limitation of her function  EMG nerve conduction study to rule out intrinsic muscle disease, lower extremity  neuromuscular etiology  Cymbalta 60 mg daily  Continue  prednisone tapering  Repeat thyroid function tests,  Total time spent reviewing the chart, obtaining history, examined patient, ordering tests, documentation, consultations and family, care coordination  was 25mnutes       YMarcial Pacas M.D. Ph.D.  [facility] [address] [place] [address]Ph: [phone]: [phone] CC:  CHoyt Koch MD [address]GRochester  [place] [id number]

## 2020-10-26 LAB — CK: Total CK: 20 U/L — ABNORMAL LOW (ref 32–182)

## 2020-10-26 LAB — THYROID PANEL WITH TSH
Free Thyroxine Index: 1.4 (ref 1.2–4.9)
T3 Uptake Ratio: 25 % (ref 24–39)
T4, Total: 5.4 ug/dL (ref 4.5–12.0)
TSH: 14 u[IU]/mL — ABNORMAL HIGH (ref 0.450–4.500)

## 2020-10-29 ENCOUNTER — Telehealth: Payer: Self-pay | Admitting: Neurology

## 2020-10-29 NOTE — Telephone Encounter (Signed)
I spoke to the patient and provided her with the lab results. She will contact her PCP to follow up.

## 2020-10-29 NOTE — Telephone Encounter (Signed)
Please call patient, laboratory evaluation showed elevated TSH, but rest of the parameters were normal, I have forward the results to her primary care physician Dr. Sharlet Salina, Real Cons, MD   She may contact her to optimize her thyroid supplement

## 2020-10-29 NOTE — Telephone Encounter (Signed)
Left second message on voice mail.

## 2020-10-31 LAB — MISC LABCORP TEST (SEND OUT): Labcorp test code: 998272

## 2020-11-01 DIAGNOSIS — K625 Hemorrhage of anus and rectum: Secondary | ICD-10-CM | POA: Diagnosis not present

## 2020-11-01 DIAGNOSIS — K5909 Other constipation: Secondary | ICD-10-CM | POA: Diagnosis not present

## 2020-11-01 DIAGNOSIS — R070 Pain in throat: Secondary | ICD-10-CM | POA: Diagnosis not present

## 2020-11-01 DIAGNOSIS — K219 Gastro-esophageal reflux disease without esophagitis: Secondary | ICD-10-CM | POA: Diagnosis not present

## 2020-11-02 LAB — MISC LABCORP TEST (SEND OUT): Labcorp test code: 998272

## 2020-11-07 NOTE — Telephone Encounter (Signed)
Pt.s niece Seth Bake is on [place]. She is asking RN to call back regarding pt.'s lab results.

## 2020-11-07 NOTE — Telephone Encounter (Signed)
Called niece and informed her of lab results, and they were sent to PCP to review elevated TSH. Seth Bake stated patient is still in rehab. I advised she let PCP know in case she changes thyroid medication. She then asked for her to be on wait list for sooner EMG; I advised I will put her on wait list. Seth Bake verbalized understanding, appreciation. Patient on wait list for sooner EMG.

## 2020-11-08 DIAGNOSIS — J9611 Chronic respiratory failure with hypoxia: Secondary | ICD-10-CM | POA: Diagnosis not present

## 2020-11-08 DIAGNOSIS — I2699 Other pulmonary embolism without acute cor pulmonale: Secondary | ICD-10-CM | POA: Diagnosis not present

## 2020-11-08 DIAGNOSIS — M6281 Muscle weakness (generalized): Secondary | ICD-10-CM | POA: Diagnosis not present

## 2020-11-09 DIAGNOSIS — M6281 Muscle weakness (generalized): Secondary | ICD-10-CM | POA: Diagnosis not present

## 2020-11-09 DIAGNOSIS — I2699 Other pulmonary embolism without acute cor pulmonale: Secondary | ICD-10-CM | POA: Diagnosis not present

## 2020-11-09 DIAGNOSIS — J9611 Chronic respiratory failure with hypoxia: Secondary | ICD-10-CM | POA: Diagnosis not present

## 2020-11-11 DIAGNOSIS — M6281 Muscle weakness (generalized): Secondary | ICD-10-CM | POA: Diagnosis not present

## 2020-11-11 DIAGNOSIS — J9611 Chronic respiratory failure with hypoxia: Secondary | ICD-10-CM | POA: Diagnosis not present

## 2020-11-11 DIAGNOSIS — I2699 Other pulmonary embolism without acute cor pulmonale: Secondary | ICD-10-CM | POA: Diagnosis not present

## 2020-11-12 DIAGNOSIS — J9611 Chronic respiratory failure with hypoxia: Secondary | ICD-10-CM | POA: Diagnosis not present

## 2020-11-12 DIAGNOSIS — I2699 Other pulmonary embolism without acute cor pulmonale: Secondary | ICD-10-CM | POA: Diagnosis not present

## 2020-11-12 DIAGNOSIS — M6281 Muscle weakness (generalized): Secondary | ICD-10-CM | POA: Diagnosis not present

## 2020-11-13 ENCOUNTER — Telehealth: Payer: Self-pay | Admitting: *Deleted

## 2020-11-13 ENCOUNTER — Telehealth: Payer: Self-pay | Admitting: Oncology

## 2020-11-13 DIAGNOSIS — M6281 Muscle weakness (generalized): Secondary | ICD-10-CM | POA: Diagnosis not present

## 2020-11-13 DIAGNOSIS — J9611 Chronic respiratory failure with hypoxia: Secondary | ICD-10-CM | POA: Diagnosis not present

## 2020-11-13 DIAGNOSIS — I2699 Other pulmonary embolism without acute cor pulmonale: Secondary | ICD-10-CM | POA: Diagnosis not present

## 2020-11-13 NOTE — Telephone Encounter (Signed)
Scheduled appt per 11/23 sch msg - left message for patient with apt date and time

## 2020-11-13 NOTE — Telephone Encounter (Signed)
Left VM that Dr. Benay Spice would like to see her in December. Has not seen her since August this year. Scheduling message sent.

## 2020-11-14 DIAGNOSIS — M6281 Muscle weakness (generalized): Secondary | ICD-10-CM | POA: Diagnosis not present

## 2020-11-14 DIAGNOSIS — I2699 Other pulmonary embolism without acute cor pulmonale: Secondary | ICD-10-CM | POA: Diagnosis not present

## 2020-11-14 DIAGNOSIS — J9611 Chronic respiratory failure with hypoxia: Secondary | ICD-10-CM | POA: Diagnosis not present

## 2020-11-15 DIAGNOSIS — J9611 Chronic respiratory failure with hypoxia: Secondary | ICD-10-CM | POA: Diagnosis not present

## 2020-11-15 DIAGNOSIS — I2699 Other pulmonary embolism without acute cor pulmonale: Secondary | ICD-10-CM | POA: Diagnosis not present

## 2020-11-15 DIAGNOSIS — M6281 Muscle weakness (generalized): Secondary | ICD-10-CM | POA: Diagnosis not present

## 2020-11-16 DIAGNOSIS — J9611 Chronic respiratory failure with hypoxia: Secondary | ICD-10-CM | POA: Diagnosis not present

## 2020-11-16 DIAGNOSIS — I2699 Other pulmonary embolism without acute cor pulmonale: Secondary | ICD-10-CM | POA: Diagnosis not present

## 2020-11-16 DIAGNOSIS — M6281 Muscle weakness (generalized): Secondary | ICD-10-CM | POA: Diagnosis not present

## 2020-11-19 DIAGNOSIS — I2699 Other pulmonary embolism without acute cor pulmonale: Secondary | ICD-10-CM | POA: Diagnosis not present

## 2020-11-19 DIAGNOSIS — J9611 Chronic respiratory failure with hypoxia: Secondary | ICD-10-CM | POA: Diagnosis not present

## 2020-11-19 DIAGNOSIS — M6281 Muscle weakness (generalized): Secondary | ICD-10-CM | POA: Diagnosis not present

## 2020-11-20 ENCOUNTER — Encounter: Payer: Medicare Other | Admitting: Neurology

## 2020-11-20 DIAGNOSIS — M6281 Muscle weakness (generalized): Secondary | ICD-10-CM | POA: Diagnosis not present

## 2020-11-20 DIAGNOSIS — I2699 Other pulmonary embolism without acute cor pulmonale: Secondary | ICD-10-CM | POA: Diagnosis not present

## 2020-11-20 DIAGNOSIS — J9611 Chronic respiratory failure with hypoxia: Secondary | ICD-10-CM | POA: Diagnosis not present

## 2020-11-21 ENCOUNTER — Ambulatory Visit: Payer: Medicare Other | Admitting: Neurology

## 2020-11-21 ENCOUNTER — Ambulatory Visit (INDEPENDENT_AMBULATORY_CARE_PROVIDER_SITE_OTHER): Payer: Medicare Other | Admitting: Neurology

## 2020-11-21 DIAGNOSIS — R531 Weakness: Secondary | ICD-10-CM

## 2020-11-21 DIAGNOSIS — R269 Unspecified abnormalities of gait and mobility: Secondary | ICD-10-CM

## 2020-11-21 NOTE — Procedures (Signed)
Full Name: Joanne Klein Gender: Female MRN #: [id number] Date of Birth: 05-30-1946    Visit Date: 11/21/2020 08:34 Age: 74 Years Examining Physician: Marcial Pacas, MD  Referring Physician: Marcial Pacas, MD History: 74 year old female, presented with bilateral lower extremity weakness, following chemo and radiation therapy for anal cancer  On examination: Bilateral upper extremity motor and sensory examination were normal, deep tendon reflexes were brisk.  There was only trace movement of bilateral lower extremity proximal and distal muscle, absent vibratory sensation to hip region, intact light touch.  Deep tendon reflex were absent  Summary of the test: Nerve conduction study: Right sural, superficial peroneal sensory responses were absent. Right peroneal to EDB motor responses were absent. Right tibial motor responses showed significantly decreased CMAP amplitude at distal stimulation site, with moderately prolonged distal latency, proximal stimulation was limited due to patient's posturing, and body habitus  Right ulnar sensory and motor responses were normal.  Electromyography: Selected needle examinations were performed at bilateral lower extremity muscles, bilateral lumbosacral paraspinal muscles, and right upper extremity muscles.  There was evidence of increased insertional activity, profound spontaneous activity at bilateral tibialis anterior, vastus medialis, rectus femoris, vastus lateralis, there was no evidence of motor unit firing.  There was no spontaneous activity at bilateral lumbosacral paraspinal muscles, with fairly normal morphology motor unit potentials and recruitment patterns.  Needle examination of right iliopsoas muscle showed increased insertional activity, profound spontaneous activity, significantly decreased number of motor unit potential, with complex polyphasics morphology with mildly decreased recruitment.  Needle examination of selected right upper  extremity muscles was normal  Conclusion: This is a significantly abnormal study.  There is electrodiagnostic evidence of significant inflammatory myopathic changes involving bilateral lower extremity muscles.  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    ------------------------------- Marcial Pacas, M.D. PhD  [facility] [address] [place], [place] [address] Tel: [phone] Fax: [phone]  Verbal informed consent was obtained from the patient, patient was informed of potential risk of procedure, including bruising, bleeding, hematoma formation, infection, muscle weakness, muscle pain, numbness, among others.        Alger    Nerve / Sites Muscle Latency Ref. Amplitude Ref. Rel Amp Segments Distance Velocity Ref. Area    ms ms mV mV %  cm m/s m/s mVms  R Ulnar - ADM     Wrist ADM 2.8 ?3.3 10.1 ?6.0 100 Wrist - ADM 7   23.2     B.Elbow ADM 5.8  8.9  88.2 B.Elbow - Wrist 19 62 ?49 21.5     A.Elbow ADM 7.5  8.6  95.9 A.Elbow - B.Elbow 10 58 ?49 21.9  R Peroneal - EDB     Ankle EDB NR ?6.5 NR ?2.0 NR Ankle - EDB 9   NR     Fib head EDB NR  NR  NR Fib head - Ankle 26 NR ?44 NR  R Tibial - AH     Ankle AH 6.5 ?5.8 1.2 ?4.0 100 Ankle - AH 9   4.0           SNC    Nerve / Sites Rec. Site Peak Lat Ref.  Amp Ref. Segments Distance    ms ms V V  cm  R Sural - Ankle (Calf)     Calf Ankle NR ?4.4 NR ?6 Calf - Ankle 14  R Superficial peroneal - Ankle     Lat leg Ankle NR ?  4.4 NR ?6 Lat leg - Ankle 14  R Ulnar - Orthodromic, (Dig V, Mid palm)     Dig V Wrist 2.8 ?3.1 8 ?5 Dig V - Wrist 14           F  Wave    Nerve F Lat Ref.   ms ms  R Tibial - AH 67.2 ?56.0  R Ulnar - ADM 27.8 ?32.0         EMG Summary Table    Spontaneous MUAP Recruitment  Muscle IA Fib PSW Fasc Other Amp Dur. Poly Pattern  R. Tibialis anterior Increased 4+ 4+ None _______ Normal Normal Normal   R. Gastrocnemius (Medial head) Normal 4+ 4+ None  _______ Normal Normal Normal No Activity  R. Vastus medialis Increased 4+ 4+ None _______ Normal Normal Normal No Activity  R. Vastus lateralis Normal 4+ None None _______ Normal Normal Normal No Activity  L. Tibialis anterior Increased 4+ 4+ None _______ Normal Normal Normal No Activity  L. Gastrocnemius (Medial head) Increased 4+ 4+ None _______ Normal Normal Normal No Activity  L. Vastus medialis Increased 4+ 4+ None _______ Normal Normal Normal No Activity  L. Vastus lateralis Increased 4+ 4+ None _______ Normal Normal Normal No Activity  R. Lumbar paraspinals (mid) Normal None None None _______ Normal Normal Normal Normal  R. Lumbar paraspinals (low) Normal None None None _______ Normal Normal Normal Normal  L. Lumbar paraspinals (mid) Normal None None None _______ Normal Normal Normal Normal  L. Lumbar paraspinals (low) Normal None None None _______ Normal Normal Normal Normal  R. First dorsal interosseous Normal None None None _______ Normal Normal Normal Normal  R. Pronator teres Normal None None None _______ Normal Normal Normal Normal  R. Deltoid Normal None None None _______ Normal Normal Normal Normal  R. Biceps brachii Normal None None None _______ Normal Normal Normal Normal  R. Brachioradialis Normal None None None _______ Normal Normal Normal Normal  R. Iliopsoas Increased 4+ 4+ None _______ Decreased Decreased 4+ Reduced

## 2020-11-21 NOTE — Progress Notes (Signed)
No chief complaint on file.   HISTORICAL  Joanne Klein is a 74 year old female, seen in request by her primary care physician Dr. Sharlet Salina, Benjamine Mola for evaluation of lower extremity weakness, she is accompanied by her niece Daiva Huge at today's visit on October 25, 2020  I reviewed and summarized the referring note.  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 Began concurrent chemoRT with 5FU/mitomycin on 10/03/2019 ? Cycle25-FU, dose reduced, mitomycin held 11/07/2019 ? Radiation completed 11/24/2019   She had a history of uterine cancer many years back.  During the chemo and radiation therapy at the end of 2020, she developed severe pancytopenia, neutropenia, epistaxis, rectal bleeding, required blood transfusion, parameter increased with Granix, she did require hospital admission in November 2020, she was able to ambulate without assistant prior to early 2021.  Since then, she had multiple emergency room presentation for variable reasons, syncope, falling, constipation, back pain, weakness, right ankle injury with right lateral malleolus oblique fracture on May 26, 2020,  Patient reported that she has finished her treatment in January 2021, since beginning chemoradiation therapy for rectal cancer in September 2020, she has developed generalized weakness, but was able to get around with walker,  Around May 2021, she noticed gradually worsening functional status, she complains of significant pain behind her knees, could not tolerate touching, bearing weight, has difficulty moving around even with her walker, she  eventually suffered right ankle fracture on May 26, 2020 following a fall accident, had rapid decline in functional status since then, could barely move her lower extremity, could not weightbearing, stayed in rehabilitation since.  She has bowel incontinence following radiaion, now wear pull up, she would stand up, she can clean herself up.   She was admitted to the hospital on October 7-20, 2021, patient developed hypoxia, tachycardia, CT chest angiogram revealed acute left-sided pulmonary emboli, right heart strain, emphysema, and aortic atherosclerosis  Ultrasound of lower extremity revealed age-indeterminate deep venous thrombosis involving right posterior tibial veins, no evidence of left lower extremity DVT  The decision was to be treated with the Xarelto for at least 6 months,  For lower extremity weakness, she was treated with prednisone 40 mg for pain and the the possibility of myositis per radiology report, continue to taper down to 10 mg every week,  Hospital admission on June 29 through June 23, 2020 for lower extremity weakness,  She presented with multiple falls, complains knee locking, numbness tingling at her feet, sharp pain behind her knees,  Personally reviewed MRI of pelvic spine with without contrast, mild but new presacral edema, edema tracking along within the iliac muscles, piriformis muscles, central gluteus muscles, bilateral hip adductor musculature in a symmetric fashion, this is also accompanied by a low-grade enhancement, radiology reads the possibility of association with prior area radiation therapy, versus myositis,  MRI of cervical spine with without contrast mild degenerative changes, no significant canal or foraminal stenosis.  MRI of lumbar spine, thoracic spine, no significant abnormality  MRI of brain, atrophy, mild supratentorium small vessel disease, chronic microhemorrhage right posterior frontal white matter  Laboratory evaluation showed hemoglobin of  7.9, normal CMP, with exception of decreased albumin 3.0, total protein of 5.4,  normal folic acid, vitamin P54, protein electrophoresis, anti-DNA antibody, ANA, uric acid, rheumatoid factor, aldolase, ESR,  CPK level was never elevated, in the range of 34-85,  Laboratory evaluations in 2021, hemoglobin of 7.9, MCH of 106.8, RDW of 15.7, CMP showed albumin of 3.0, calcium of 8.5, total protein of 5.4, S56 812, folic acid 7.5,TZG 01.7, Negative ANA, C-reactive protein was mildly elevated 6.4, rheumatoid factor was less than 10, ESR was mildly elevated 44, aldolase was normal 4.8, CPK was only 34,  Update November 21, 2020: She return with her niece for EMG nerve conduction study, which showed evidence of profound spontaneous activity of bilateral lower extremity muscles, with significantly decreased number polyphasic small motor unit potential, with early recruitment patterns,  In addition, MRI of pelvic region with without contrast, showed mild but new presacral edema, edema tracking along within the iliac muscles, piriformis muscles, central gluteus muscles, bilateral hip adductor musculature in a symmetric fashion, this is also accompanied by a low-grade enhancement, radiologist reads the possibility of association with prior area radiation therapy, versus myositis, this concurred with the significant abnormality on the examination, likely indicate necrotic inflammatory myopathic changes,  Patient has significant limitation due to profound bilateral lower extremity weakness, not weightbearing, prolonged nursing home stay  REVIEW OF SYSTEMS: Full 14 system review of systems performed and notable only for as above All other review of systems were negative.  ALLERGIES: Allergies  Allergen Reactions  . Prozac [Fluoxetine Hcl] Anaphylaxis  . Codeine Itching  . Morphine And Related Itching  . Sulfa Antibiotics Itching and Nausea Only    HOME MEDICATIONS: Current Outpatient Medications    Medication Sig Dispense Refill  . calcium carbonate (TUMS - DOSED IN MG ELEMENTAL CALCIUM) 500 MG chewable tablet Chew 1 tablet by mouth daily as needed for indigestion or heartburn.     . Cholecalciferol (VITAMIN D-3) 1000 UNITS CAPS Take 1,000 Units by mouth daily.     . DULoxetine (CYMBALTA) 60 MG capsule Take 1 capsule (60 mg total) by mouth daily. 30 capsule 6  . levothyroxine (SYNTHROID) 75 MCG tablet TAKE 1 TABLET BY MOUTH  DAILY BEFORE BREAKFAST (Patient taking differently: Take 75 mcg by mouth daily before breakfast. ) 90 tablet 3  . omeprazole (PRILOSEC) 40 MG capsule TAKE 1 CAPSULE BY MOUTH  DAILY (Patient taking differently: Take 40 mg by mouth daily. ) 90 capsule 3  . ondansetron (ZOFRAN) 8 MG tablet Take 8 mg by mouth every 8 (eight) hours as needed for nausea or vomiting.    Marland Kitchen oxyCODONE 10 MG TABS Take 1 tablet (10 mg total) by mouth every 4 (four) hours. 30 tablet 0  . phenylephrine-shark liver oil-mineral oil-petrolatum (PREPARATION H) 0.25-3-14-71.9 % rectal ointment Place 1 application rectally 2 (two) times daily as needed for hemorrhoids.    . Polyethylene Glycol 3350 (MIRALAX PO) Take 17 g by mouth at bedtime.     . predniSONE (DELTASONE) 10 MG tablet Take 4 tablets daily for 5 days Then 3 tablets daily for 5 days Then 2 tablets daily for 5 days Then 1 tablet daily till all the tablets are done 60 tablet 0  . pregabalin (LYRICA) 75 MG capsule Take 1 capsule (75 mg total) by mouth 2 (two) times daily. 60 capsule 0  . RIVAROXABAN (XARELTO) VTE STARTER PACK (15 & 20 MG TABLETS) Follow package directions: Take one $Remove'15mg'yCpQioj$  tablet by mouth twice a day till October 18, 2020 then start taking 1 tablet daily. Take with food. 16 each 0  .  sertraline (ZOLOFT) 100 MG tablet TAKE 2 TABLETS BY MOUTH AT  BEDTIME (Patient taking differently: Take 200 mg by mouth every evening. ) 180 tablet 3   No current facility-administered medications for this visit.    PAST MEDICAL HISTORY: Past  Medical History:  Diagnosis Date  . Allergic rhinitis   . Arthritis   . Bursitis    Both Shoulders  . Chronic low back pain   . Degenerative disk disease   . Depression   . Diverticulitis   . Emphysema of lung ([place])   . Fibromyalgia   . GERD (gastroesophageal reflux disease)   . History of fibromyalgia   . Hypertension   . Hypothyroidism   . Irritable bowel syndrome   . Neuralgia, post-herpetic   . Obesity, unspecified   . Obstructive sleep apnea   . OCD (obsessive compulsive disorder)   . Pain in joint, site unspecified   . Pure hypercholesterolemia   . Scoliosis   . Situational stress   . Spastic colon   . Tubular adenoma   . Uterine cancer ([place])     PAST SURGICAL HISTORY: Past Surgical History:  Procedure Laterality Date  . BIOPSY  10/08/2020   Procedure: BIOPSY;  Surgeon: Jackquline Denmark, MD;  Location: [facility];  Service: Gastroenterology;;  . BREAST LUMPECTOMY     knot removed  . CHOLECYSTECTOMY    . COLONOSCOPY WITH PROPOFOL N/A 10/08/2020   Procedure: COLONOSCOPY WITH PROPOFOL;  Surgeon: Jackquline Denmark, MD;  Location: [facility];  Service: Gastroenterology;  Laterality: N/A;  . ESOPHAGOGASTRODUODENOSCOPY (EGD) WITH PROPOFOL N/A 10/08/2020   Procedure: ESOPHAGOGASTRODUODENOSCOPY (EGD) WITH PROPOFOL;  Surgeon: Jackquline Denmark, MD;  Location: [facility];  Service: Gastroenterology;  Laterality: N/A;  . HOT HEMOSTASIS N/A 10/08/2020   Procedure: HOT HEMOSTASIS (ARGON PLASMA COAGULATION/BICAP);  Surgeon: Jackquline Denmark, MD;  Location: [facility];  Service: Gastroenterology;  Laterality: N/A;  . HYSTEROTOMY    . POLYPECTOMY  10/08/2020   Procedure: POLYPECTOMY;  Surgeon: Jackquline Denmark, MD;  Location: [facility];  Service: Gastroenterology;;  . RADIOLOGY WITH ANESTHESIA Bilateral 06/14/2020   Procedure: MRI WITH ANESTHESIA;  Surgeon: Radiologist, Medication, MD;  Location: [facility];  Service: Radiology;  Laterality: Bilateral;  . RADIOLOGY WITH ANESTHESIA N/A  06/21/2020   Procedure: MRI WITH ANESTHESIA;  Surgeon: Radiologist, Medication, MD;  Location: [facility];  Service: Radiology;  Laterality: N/A;  . TONSILLECTOMY    . tumor in left forearm      FAMILY HISTORY: Family History  Problem Relation Age of Onset  . Stomach cancer Father   . Liver cancer Father   . Diabetes Father   . Other Mother        unsure of medical history  . Colon cancer Neg Hx     SOCIAL HISTORY: Social History   Socioeconomic History  . Marital status: Divorced    Spouse name: Not on file  . Number of children: 1  . Years of education: some college  . Highest education level: Not on file  Occupational History  . Occupation: retired  Tobacco Use  . Smoking status: Former Smoker    Packs/day: 2.00    Years: 34.00    Pack years: 68.00    Types: Cigarettes    Quit date: 12/15/1996    Years since quitting: 23.9  . Smokeless tobacco: Never Used  Vaping Use  . Vaping Use: Every day  . Substances: Nicotine  Substance and Sexual Activity  . Alcohol use: Not Currently  . Drug use: No  .  Sexual activity: Not on file  Other Topics Concern  . Not on file  Social History Narrative   Lives alone.   Right-handed.    Two cans of Pepsi per day.   Social Determinants of Health   Financial Resource Strain:   . Difficulty of Paying Living Expenses: Not on file  Food Insecurity:   . Worried About Charity fundraiser in the Last Year: Not on file  . Ran Out of Food in the Last Year: Not on file  Transportation Needs: No Transportation Needs  . Lack of Transportation (Medical): No  . Lack of Transportation (Non-Medical): No  Physical Activity:   . Days of Exercise per Week: Not on file  . Minutes of Exercise per Session: Not on file  Stress:   . Feeling of Stress : Not on file  Social Connections:   . Frequency of Communication with Friends and Family: Not on file  . Frequency of Social Gatherings with Friends and Family: Not on file  . Attends Religious  Services: Not on file  . Active Member of Clubs or Organizations: Not on file  . Attends Archivist Meetings: Not on file  . Marital Status: Not on file  Intimate Partner Violence:   . Fear of Current or Ex-Partner: Not on file  . Emotionally Abused: Not on file  . Physically Abused: Not on file  . Sexually Abused: Not on file     PHYSICAL EXAM   There were no vitals filed for this visit. Not recorded     There is no height or weight on file to calculate BMI.  PHYSICAL EXAMNIATION:  Gen: NAD, conversant, well nourised, well groomed                     Cardiovascular: Regular rate rhythm, no peripheral edema, warm, nontender. Eyes: Conjunctivae clear without exudates or hemorrhage Neck: Supple, no carotid bruits. Pulmonary: Clear to auscultation bilaterally   NEUROLOGICAL EXAM:  MENTAL STATUS: Speech/cognition: Awake, alert, oriented to history taking and casual conversation   CRANIAL NERVES: CN II: Visual fields are full to confrontation. Pupils are round equal and briskly reactive to light. CN III, IV, VI: extraocular movement are normal. No ptosis. CN V: Facial sensation is intact to light touch CN VII: Face is symmetric with normal eye closure  CN VIII: Hearing is normal to causal conversation. CN IX, X: Phonation is normal. CN XI: Head turning and shoulder shrug are intact  MOTOR: Bilateral upper extremity were normal. Trace movement of bilateral upper and lower extremity, not antigravity.  REFLEXES: Reflexes are 2 and symmetric at the biceps, triceps, absent at knees and ankles. Plantar responses are flexor.  SENSORY: Intact to light touch, pinprick COORDINATION: There is no trunk or limb dysmetria noted.  GAIT/STANCE: Deferred   DIAGNOSTIC DATA (LABS, IMAGING, TESTING) - I reviewed patient records, labs, notes, testing and imaging myself where available.   ASSESSMENT AND PLAN  MCKAELA HOWLEY is a 74 y.o. female   Bilateral lower  extremity weakness, gait abnormality,  Following chemotherapy radiation therapy for rectal cancer at the end of 2020,  But continued slow worsening since May 2021,  CPK has always been within normal limit, despite described proximal muscle signal abnormality on MRI pelvic with without contrast,  EMG nerve conduction study showed profound spontaneous activity at bilateral lower extremity proximal and distal muscles, with only few activating motor unit noted at right iliopsoas muscle, complex, polyphasic, with early recruitment patterns,  recent possibility of necrotic myopathic changes versus post radiation related changes,   She denies any improvement with prednisone treatment up to 60 mg daily, has tapered off,  She had significant functional limitation, no longer weightbearing, ambulatory, nursing home bound,  Will proceed with right vastus lateralis muscle biopsy, to clarify the diagnosis, and for better prediction of prognosis  Marcial Pacas, M.D. Ph.D.  [facility] [address], [place], [place] [address] Ph: [phone] Fax: [phone]  CC:  Hoyt Koch, MD [address] [place],  [place] [address]

## 2020-11-22 DIAGNOSIS — M6281 Muscle weakness (generalized): Secondary | ICD-10-CM | POA: Diagnosis not present

## 2020-11-22 DIAGNOSIS — I2699 Other pulmonary embolism without acute cor pulmonale: Secondary | ICD-10-CM | POA: Diagnosis not present

## 2020-11-22 DIAGNOSIS — J9611 Chronic respiratory failure with hypoxia: Secondary | ICD-10-CM | POA: Diagnosis not present

## 2020-11-23 DIAGNOSIS — I2699 Other pulmonary embolism without acute cor pulmonale: Secondary | ICD-10-CM | POA: Diagnosis not present

## 2020-11-23 DIAGNOSIS — J9611 Chronic respiratory failure with hypoxia: Secondary | ICD-10-CM | POA: Diagnosis not present

## 2020-11-23 DIAGNOSIS — M6281 Muscle weakness (generalized): Secondary | ICD-10-CM | POA: Diagnosis not present

## 2020-11-24 DIAGNOSIS — M6281 Muscle weakness (generalized): Secondary | ICD-10-CM | POA: Diagnosis not present

## 2020-11-24 DIAGNOSIS — I2699 Other pulmonary embolism without acute cor pulmonale: Secondary | ICD-10-CM | POA: Diagnosis not present

## 2020-11-24 DIAGNOSIS — J9611 Chronic respiratory failure with hypoxia: Secondary | ICD-10-CM | POA: Diagnosis not present

## 2020-11-26 DIAGNOSIS — M6281 Muscle weakness (generalized): Secondary | ICD-10-CM | POA: Diagnosis not present

## 2020-11-26 DIAGNOSIS — I2699 Other pulmonary embolism without acute cor pulmonale: Secondary | ICD-10-CM | POA: Diagnosis not present

## 2020-11-26 DIAGNOSIS — J9611 Chronic respiratory failure with hypoxia: Secondary | ICD-10-CM | POA: Diagnosis not present

## 2020-11-27 DIAGNOSIS — M6281 Muscle weakness (generalized): Secondary | ICD-10-CM | POA: Diagnosis not present

## 2020-11-27 DIAGNOSIS — I2699 Other pulmonary embolism without acute cor pulmonale: Secondary | ICD-10-CM | POA: Diagnosis not present

## 2020-11-27 DIAGNOSIS — J9611 Chronic respiratory failure with hypoxia: Secondary | ICD-10-CM | POA: Diagnosis not present

## 2020-11-28 DIAGNOSIS — I2699 Other pulmonary embolism without acute cor pulmonale: Secondary | ICD-10-CM | POA: Diagnosis not present

## 2020-11-28 DIAGNOSIS — M6281 Muscle weakness (generalized): Secondary | ICD-10-CM | POA: Diagnosis not present

## 2020-11-28 DIAGNOSIS — J9611 Chronic respiratory failure with hypoxia: Secondary | ICD-10-CM | POA: Diagnosis not present

## 2020-11-29 DIAGNOSIS — I2699 Other pulmonary embolism without acute cor pulmonale: Secondary | ICD-10-CM | POA: Diagnosis not present

## 2020-11-29 DIAGNOSIS — M6281 Muscle weakness (generalized): Secondary | ICD-10-CM | POA: Diagnosis not present

## 2020-11-29 DIAGNOSIS — J9611 Chronic respiratory failure with hypoxia: Secondary | ICD-10-CM | POA: Diagnosis not present

## 2020-11-30 DIAGNOSIS — I2699 Other pulmonary embolism without acute cor pulmonale: Secondary | ICD-10-CM | POA: Diagnosis not present

## 2020-11-30 DIAGNOSIS — M6281 Muscle weakness (generalized): Secondary | ICD-10-CM | POA: Diagnosis not present

## 2020-11-30 DIAGNOSIS — J9611 Chronic respiratory failure with hypoxia: Secondary | ICD-10-CM | POA: Diagnosis not present

## 2020-12-03 DIAGNOSIS — I2699 Other pulmonary embolism without acute cor pulmonale: Secondary | ICD-10-CM | POA: Diagnosis not present

## 2020-12-03 DIAGNOSIS — J9611 Chronic respiratory failure with hypoxia: Secondary | ICD-10-CM | POA: Diagnosis not present

## 2020-12-03 DIAGNOSIS — M6281 Muscle weakness (generalized): Secondary | ICD-10-CM | POA: Diagnosis not present

## 2020-12-03 DIAGNOSIS — I82891 Chronic embolism and thrombosis of other specified veins: Secondary | ICD-10-CM | POA: Diagnosis not present

## 2020-12-03 DIAGNOSIS — J449 Chronic obstructive pulmonary disease, unspecified: Secondary | ICD-10-CM | POA: Diagnosis not present

## 2020-12-04 DIAGNOSIS — J9611 Chronic respiratory failure with hypoxia: Secondary | ICD-10-CM | POA: Diagnosis not present

## 2020-12-04 DIAGNOSIS — I2699 Other pulmonary embolism without acute cor pulmonale: Secondary | ICD-10-CM | POA: Diagnosis not present

## 2020-12-04 DIAGNOSIS — M6281 Muscle weakness (generalized): Secondary | ICD-10-CM | POA: Diagnosis not present

## 2020-12-05 DIAGNOSIS — I2699 Other pulmonary embolism without acute cor pulmonale: Secondary | ICD-10-CM | POA: Diagnosis not present

## 2020-12-05 DIAGNOSIS — J9611 Chronic respiratory failure with hypoxia: Secondary | ICD-10-CM | POA: Diagnosis not present

## 2020-12-05 DIAGNOSIS — M6281 Muscle weakness (generalized): Secondary | ICD-10-CM | POA: Diagnosis not present

## 2020-12-06 DIAGNOSIS — J9611 Chronic respiratory failure with hypoxia: Secondary | ICD-10-CM | POA: Diagnosis not present

## 2020-12-06 DIAGNOSIS — M6281 Muscle weakness (generalized): Secondary | ICD-10-CM | POA: Diagnosis not present

## 2020-12-06 DIAGNOSIS — J449 Chronic obstructive pulmonary disease, unspecified: Secondary | ICD-10-CM | POA: Diagnosis not present

## 2020-12-08 DIAGNOSIS — K219 Gastro-esophageal reflux disease without esophagitis: Secondary | ICD-10-CM | POA: Diagnosis not present

## 2020-12-08 DIAGNOSIS — C218 Malignant neoplasm of overlapping sites of rectum, anus and anal canal: Secondary | ICD-10-CM | POA: Diagnosis not present

## 2020-12-08 DIAGNOSIS — D630 Anemia in neoplastic disease: Secondary | ICD-10-CM | POA: Diagnosis not present

## 2020-12-08 DIAGNOSIS — M5136 Other intervertebral disc degeneration, lumbar region: Secondary | ICD-10-CM | POA: Diagnosis not present

## 2020-12-08 DIAGNOSIS — F32A Depression, unspecified: Secondary | ICD-10-CM | POA: Diagnosis not present

## 2020-12-08 DIAGNOSIS — E785 Hyperlipidemia, unspecified: Secondary | ICD-10-CM | POA: Diagnosis not present

## 2020-12-08 DIAGNOSIS — M797 Fibromyalgia: Secondary | ICD-10-CM | POA: Diagnosis not present

## 2020-12-08 DIAGNOSIS — E039 Hypothyroidism, unspecified: Secondary | ICD-10-CM | POA: Diagnosis not present

## 2020-12-08 DIAGNOSIS — Z9989 Dependence on other enabling machines and devices: Secondary | ICD-10-CM | POA: Diagnosis not present

## 2020-12-08 DIAGNOSIS — I10 Essential (primary) hypertension: Secondary | ICD-10-CM | POA: Diagnosis not present

## 2020-12-08 DIAGNOSIS — G4733 Obstructive sleep apnea (adult) (pediatric): Secondary | ICD-10-CM | POA: Diagnosis not present

## 2020-12-10 ENCOUNTER — Telehealth: Payer: Self-pay | Admitting: Internal Medicine

## 2020-12-10 NOTE — Telephone Encounter (Signed)
Would need visit first. Can be in person or video but cannot be telephone due to medicare regulations.

## 2020-12-10 NOTE — Telephone Encounter (Signed)
Joanne Klein w/ [facility] is requesting verbal orders for physical therpay 2w6, 1w2. She also said that the patient needs a hospital bed and a wheelchair.

## 2020-12-11 ENCOUNTER — Inpatient Hospital Stay: Payer: Medicare Other | Admitting: Oncology

## 2020-12-11 ENCOUNTER — Telehealth: Payer: Self-pay | Admitting: *Deleted

## 2020-12-11 DIAGNOSIS — I10 Essential (primary) hypertension: Secondary | ICD-10-CM | POA: Diagnosis not present

## 2020-12-11 DIAGNOSIS — Z9989 Dependence on other enabling machines and devices: Secondary | ICD-10-CM | POA: Diagnosis not present

## 2020-12-11 DIAGNOSIS — K219 Gastro-esophageal reflux disease without esophagitis: Secondary | ICD-10-CM | POA: Diagnosis not present

## 2020-12-11 DIAGNOSIS — E785 Hyperlipidemia, unspecified: Secondary | ICD-10-CM | POA: Diagnosis not present

## 2020-12-11 DIAGNOSIS — F32A Depression, unspecified: Secondary | ICD-10-CM | POA: Diagnosis not present

## 2020-12-11 DIAGNOSIS — E039 Hypothyroidism, unspecified: Secondary | ICD-10-CM | POA: Diagnosis not present

## 2020-12-11 DIAGNOSIS — C218 Malignant neoplasm of overlapping sites of rectum, anus and anal canal: Secondary | ICD-10-CM | POA: Diagnosis not present

## 2020-12-11 DIAGNOSIS — D630 Anemia in neoplastic disease: Secondary | ICD-10-CM | POA: Diagnosis not present

## 2020-12-11 DIAGNOSIS — M5136 Other intervertebral disc degeneration, lumbar region: Secondary | ICD-10-CM | POA: Diagnosis not present

## 2020-12-11 DIAGNOSIS — G4733 Obstructive sleep apnea (adult) (pediatric): Secondary | ICD-10-CM | POA: Diagnosis not present

## 2020-12-11 DIAGNOSIS — M797 Fibromyalgia: Secondary | ICD-10-CM | POA: Diagnosis not present

## 2020-12-11 NOTE — Telephone Encounter (Signed)
Called to cancel her appointment today--she forgot to call to arrange her transportation. Rescheduled for 12/24/20 at 3pm and patient aware. Reminded her to arrange transportation week prior.

## 2020-12-12 ENCOUNTER — Encounter: Payer: Self-pay | Admitting: Internal Medicine

## 2020-12-12 ENCOUNTER — Telehealth: Payer: Self-pay | Admitting: Internal Medicine

## 2020-12-12 ENCOUNTER — Telehealth (INDEPENDENT_AMBULATORY_CARE_PROVIDER_SITE_OTHER): Payer: Medicare Other | Admitting: Internal Medicine

## 2020-12-12 DIAGNOSIS — E785 Hyperlipidemia, unspecified: Secondary | ICD-10-CM | POA: Diagnosis not present

## 2020-12-12 DIAGNOSIS — D630 Anemia in neoplastic disease: Secondary | ICD-10-CM | POA: Diagnosis not present

## 2020-12-12 DIAGNOSIS — K219 Gastro-esophageal reflux disease without esophagitis: Secondary | ICD-10-CM | POA: Diagnosis not present

## 2020-12-12 DIAGNOSIS — I2690 Septic pulmonary embolism without acute cor pulmonale: Secondary | ICD-10-CM

## 2020-12-12 DIAGNOSIS — M797 Fibromyalgia: Secondary | ICD-10-CM | POA: Diagnosis not present

## 2020-12-12 DIAGNOSIS — R29898 Other symptoms and signs involving the musculoskeletal system: Secondary | ICD-10-CM | POA: Diagnosis not present

## 2020-12-12 DIAGNOSIS — Z9989 Dependence on other enabling machines and devices: Secondary | ICD-10-CM | POA: Diagnosis not present

## 2020-12-12 DIAGNOSIS — C218 Malignant neoplasm of overlapping sites of rectum, anus and anal canal: Secondary | ICD-10-CM | POA: Diagnosis not present

## 2020-12-12 DIAGNOSIS — G4733 Obstructive sleep apnea (adult) (pediatric): Secondary | ICD-10-CM | POA: Diagnosis not present

## 2020-12-12 DIAGNOSIS — M5136 Other intervertebral disc degeneration, lumbar region: Secondary | ICD-10-CM | POA: Diagnosis not present

## 2020-12-12 DIAGNOSIS — I10 Essential (primary) hypertension: Secondary | ICD-10-CM | POA: Diagnosis not present

## 2020-12-12 DIAGNOSIS — E039 Hypothyroidism, unspecified: Secondary | ICD-10-CM | POA: Diagnosis not present

## 2020-12-12 DIAGNOSIS — F32A Depression, unspecified: Secondary | ICD-10-CM | POA: Diagnosis not present

## 2020-12-12 NOTE — Telephone Encounter (Signed)
Janett Billow from [facility] calling, stating she did a nursing evaluation today and she would like to get orders for 1 time a week for 4 weeks. She would also like to get the patient a social work evaluation. Janett Billow- [phone] Okay to lvm

## 2020-12-12 NOTE — Telephone Encounter (Signed)
Orders placed in computer for hospital bed. Okay also for verbal orders for PT/OT starting 11/12/20 and later. We cannot just order electric wheelchair without assessment but okay to order assessment for this. Would likely need visit after assessment done for that purpose only.

## 2020-12-12 NOTE — Telephone Encounter (Signed)
Dr Sharlet Salina response in another phone note from today re: OT: "Okay also for verbal orders for PT/OT starting 11/12/20 and later." [facility] notified of this response.  States she went to visit pt today for a scheduled visit & pt did not answer door so OT was not done today.

## 2020-12-12 NOTE — Assessment & Plan Note (Signed)
Per D/C note from hospital recommendation for 6 months xarelto which would conclude in April 2022.

## 2020-12-12 NOTE — Progress Notes (Signed)
Virtual Visit via Video Note  I connected with Joanne Klein on 12/12/20 at 10:20 AM EST by a video enabled telemedicine application and verified that I am speaking with the correct person using two identifiers.  The patient and the provider were at separate locations throughout the entire encounter. Patient location: home, Provider location: work   I discussed the limitations of evaluation and management by telemedicine and the availability of in person appointments. The patient expressed understanding and agreed to proceed. The patient and the provider were the only parties present for the visit unless noted in HPI below.  History of Present Illness: The patient is a 74 y.o. female with visit for needing home health orders as well as wheelchair and hospital bed. She has been experiencing progressive weakness over the last year with extensive workup. Multiple hospital stays including most recent October with rehab stay afterwards. Recent nerve conduction study from neurology and she states they told her that the nerves were just damaged as well as the muscle and she would just be paralyzed for life from waist down. This is though to be sequelae of recent anal cancer treatment. She is not able to transfer independently from bed to chair. She is needing hospital bed as she requires frequent repositioning to avoid ulceration. She does have impaired sensation below the waist. She denies current redness or ulceration on the buttocks, legs. Has a neighbor who is helping her currently.   The patient requires a hospital bed because he/she requires positioning of the body in ways not feasible with an ordinary bed or to relieve pain.  The patient requires a regular hospital bed.  A gel overlay is required as the patient has limited mobility AND the patient has altered sensory perception.  Observations/Objective: Appearance: lying down, breathing appears normal, no coughing, casual grooming, abdomen does not  appear distended, memory normal, mental status is A and O times 3  Assessment and Plan: See problem oriented charting  Follow Up Instructions: order hospital bed and verbal orders okay for 11/12/20 and forward  I discussed the assessment and treatment plan with the patient. The patient was provided an opportunity to ask questions and all were answered. The patient agreed with the plan and demonstrated an understanding of the instructions.   The patient was advised to call back or seek an in-person evaluation if the symptoms worsen or if the condition fails to improve as anticipated.  Hoyt Koch, MD

## 2020-12-12 NOTE — Telephone Encounter (Signed)
Joanne Klein is requesting to move OT evaluation to next week, 12.27.2021.    Phone: [phone].

## 2020-12-12 NOTE — Assessment & Plan Note (Addendum)
Needs hospital bed. The patient requires a hospital bed because he/she requires positioning of the body in ways not feasible with an ordinary bed or to relieve pain.  The patient requires a regular hospital bed.  A gel overlay is required as the patient has limited mobility AND the patient has altered sensory perception.

## 2020-12-12 NOTE — Telephone Encounter (Signed)
Daleen Snook from [facility] Orders for hospital bed and power wheel chair due to patient's current condition.  Please give her a call back at [phone]

## 2020-12-13 DIAGNOSIS — Z79891 Long term (current) use of opiate analgesic: Secondary | ICD-10-CM | POA: Diagnosis not present

## 2020-12-13 DIAGNOSIS — G603 Idiopathic progressive neuropathy: Secondary | ICD-10-CM | POA: Diagnosis not present

## 2020-12-13 DIAGNOSIS — G894 Chronic pain syndrome: Secondary | ICD-10-CM | POA: Diagnosis not present

## 2020-12-13 DIAGNOSIS — M797 Fibromyalgia: Secondary | ICD-10-CM | POA: Diagnosis not present

## 2020-12-13 NOTE — Telephone Encounter (Signed)
Left detailed message giving verbal orders for below.  

## 2020-12-13 NOTE — Telephone Encounter (Signed)
Okay for both

## 2020-12-13 NOTE — Telephone Encounter (Signed)
Left detailed message informing Joanne Klein of below.

## 2020-12-17 ENCOUNTER — Telehealth: Payer: Self-pay | Admitting: Internal Medicine

## 2020-12-17 DIAGNOSIS — E039 Hypothyroidism, unspecified: Secondary | ICD-10-CM | POA: Diagnosis not present

## 2020-12-17 DIAGNOSIS — F32A Depression, unspecified: Secondary | ICD-10-CM | POA: Diagnosis not present

## 2020-12-17 DIAGNOSIS — K219 Gastro-esophageal reflux disease without esophagitis: Secondary | ICD-10-CM | POA: Diagnosis not present

## 2020-12-17 DIAGNOSIS — E785 Hyperlipidemia, unspecified: Secondary | ICD-10-CM | POA: Diagnosis not present

## 2020-12-17 DIAGNOSIS — D630 Anemia in neoplastic disease: Secondary | ICD-10-CM | POA: Diagnosis not present

## 2020-12-17 DIAGNOSIS — G4733 Obstructive sleep apnea (adult) (pediatric): Secondary | ICD-10-CM | POA: Diagnosis not present

## 2020-12-17 DIAGNOSIS — M5136 Other intervertebral disc degeneration, lumbar region: Secondary | ICD-10-CM | POA: Diagnosis not present

## 2020-12-17 DIAGNOSIS — M797 Fibromyalgia: Secondary | ICD-10-CM | POA: Diagnosis not present

## 2020-12-17 DIAGNOSIS — Z9989 Dependence on other enabling machines and devices: Secondary | ICD-10-CM | POA: Diagnosis not present

## 2020-12-17 DIAGNOSIS — C218 Malignant neoplasm of overlapping sites of rectum, anus and anal canal: Secondary | ICD-10-CM | POA: Diagnosis not present

## 2020-12-17 DIAGNOSIS — I10 Essential (primary) hypertension: Secondary | ICD-10-CM | POA: Diagnosis not present

## 2020-12-17 NOTE — Telephone Encounter (Signed)
   Peter Minium from [facility] requesting last office note be faxed to her Phone [phone] Fax [phone]

## 2020-12-17 NOTE — Telephone Encounter (Signed)
Ailene Ravel an occupational therapist with [facility] calling to request verbal orders for [facility] OT 1 time a week for 5 week. She is also wondering where we placed the order for the hospital bed went to. Ailene Ravel- [phone] Okay to lvm

## 2020-12-18 DIAGNOSIS — E039 Hypothyroidism, unspecified: Secondary | ICD-10-CM | POA: Diagnosis not present

## 2020-12-18 DIAGNOSIS — M797 Fibromyalgia: Secondary | ICD-10-CM | POA: Diagnosis not present

## 2020-12-18 DIAGNOSIS — Z9989 Dependence on other enabling machines and devices: Secondary | ICD-10-CM | POA: Diagnosis not present

## 2020-12-18 DIAGNOSIS — C218 Malignant neoplasm of overlapping sites of rectum, anus and anal canal: Secondary | ICD-10-CM | POA: Diagnosis not present

## 2020-12-18 DIAGNOSIS — G4733 Obstructive sleep apnea (adult) (pediatric): Secondary | ICD-10-CM | POA: Diagnosis not present

## 2020-12-18 DIAGNOSIS — M5136 Other intervertebral disc degeneration, lumbar region: Secondary | ICD-10-CM | POA: Diagnosis not present

## 2020-12-18 DIAGNOSIS — E785 Hyperlipidemia, unspecified: Secondary | ICD-10-CM | POA: Diagnosis not present

## 2020-12-18 DIAGNOSIS — I10 Essential (primary) hypertension: Secondary | ICD-10-CM | POA: Diagnosis not present

## 2020-12-18 DIAGNOSIS — D630 Anemia in neoplastic disease: Secondary | ICD-10-CM | POA: Diagnosis not present

## 2020-12-18 DIAGNOSIS — K219 Gastro-esophageal reflux disease without esophagitis: Secondary | ICD-10-CM | POA: Diagnosis not present

## 2020-12-18 DIAGNOSIS — F32A Depression, unspecified: Secondary | ICD-10-CM | POA: Diagnosis not present

## 2020-12-19 DIAGNOSIS — F32A Depression, unspecified: Secondary | ICD-10-CM | POA: Diagnosis not present

## 2020-12-19 DIAGNOSIS — G4733 Obstructive sleep apnea (adult) (pediatric): Secondary | ICD-10-CM | POA: Diagnosis not present

## 2020-12-19 DIAGNOSIS — C218 Malignant neoplasm of overlapping sites of rectum, anus and anal canal: Secondary | ICD-10-CM | POA: Diagnosis not present

## 2020-12-19 DIAGNOSIS — K219 Gastro-esophageal reflux disease without esophagitis: Secondary | ICD-10-CM | POA: Diagnosis not present

## 2020-12-19 DIAGNOSIS — E039 Hypothyroidism, unspecified: Secondary | ICD-10-CM | POA: Diagnosis not present

## 2020-12-19 DIAGNOSIS — M5136 Other intervertebral disc degeneration, lumbar region: Secondary | ICD-10-CM | POA: Diagnosis not present

## 2020-12-19 DIAGNOSIS — Z9989 Dependence on other enabling machines and devices: Secondary | ICD-10-CM | POA: Diagnosis not present

## 2020-12-19 DIAGNOSIS — M797 Fibromyalgia: Secondary | ICD-10-CM | POA: Diagnosis not present

## 2020-12-19 DIAGNOSIS — I10 Essential (primary) hypertension: Secondary | ICD-10-CM | POA: Diagnosis not present

## 2020-12-19 DIAGNOSIS — D630 Anemia in neoplastic disease: Secondary | ICD-10-CM | POA: Diagnosis not present

## 2020-12-19 DIAGNOSIS — E785 Hyperlipidemia, unspecified: Secondary | ICD-10-CM | POA: Diagnosis not present

## 2020-12-19 NOTE — Telephone Encounter (Signed)
Fine

## 2020-12-19 NOTE — Telephone Encounter (Signed)
Verbal orders given for below.  

## 2020-12-20 NOTE — Telephone Encounter (Signed)
12/12/20 OV note printed and faxed to # below.

## 2020-12-24 ENCOUNTER — Other Ambulatory Visit: Payer: Self-pay

## 2020-12-24 ENCOUNTER — Inpatient Hospital Stay: Payer: Medicare Other | Attending: Oncology | Admitting: Oncology

## 2020-12-24 VITALS — BP 102/65 | HR 94 | Temp 98.5°F | Resp 17 | Ht 66.0 in

## 2020-12-24 DIAGNOSIS — E041 Nontoxic single thyroid nodule: Secondary | ICD-10-CM | POA: Diagnosis not present

## 2020-12-24 DIAGNOSIS — D701 Agranulocytosis secondary to cancer chemotherapy: Secondary | ICD-10-CM | POA: Diagnosis not present

## 2020-12-24 DIAGNOSIS — C210 Malignant neoplasm of anus, unspecified: Secondary | ICD-10-CM | POA: Diagnosis not present

## 2020-12-24 DIAGNOSIS — E039 Hypothyroidism, unspecified: Secondary | ICD-10-CM | POA: Insufficient documentation

## 2020-12-24 DIAGNOSIS — D61818 Other pancytopenia: Secondary | ICD-10-CM | POA: Insufficient documentation

## 2020-12-24 DIAGNOSIS — D6959 Other secondary thrombocytopenia: Secondary | ICD-10-CM | POA: Diagnosis not present

## 2020-12-24 DIAGNOSIS — Z923 Personal history of irradiation: Secondary | ICD-10-CM | POA: Insufficient documentation

## 2020-12-24 NOTE — Progress Notes (Signed)
[facility] OFFICE PROGRESS NOTE   Diagnosis: Anal cancer  INTERVAL HISTORY:   Joanne Klein was admitted to the hospital in October 2021 with progressive leg weakness.  She was noted to have anemia with a Hemoccult positive stool.  She underwent upper and lower endoscopies.  There was no evidence of recurrent anal cancer.  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  There was nonerosive esophagitis and 3 nonbleeding angiodysplastic lesions in the stomach were treated with APC.  The transverse colon polyps returned as tubular adenomas.  She was discharged on Xarelto anticoagulation.  The vitamin B12 level returned low.  She was treated with IV iron and vitamin B12.  She is not aware of continuing vitamin B12 as an outpatient.  She was discharged to home on 10/10/2020 to a skilled nursing facility.  She returned home approximately 2 weeks ago.  She is cared for by her daughter in the home.  She had a recent episode of rectal bleeding.  Joanne Klein continues to have lower extremity weakness.  She is bedbound.  She is followed by Dr. Terrace Arabia and is scheduled for a muscle biopsy.  No arm or hand weakness or numbness.    Objective:  Vital signs in last 24 hours:  Blood pressure 102/65, pulse 94, temperature 98.5 F (36.9 C), temperature source Tympanic, resp. rate 17, height 5\' 6"  (1.676 m), SpO2 90 %.    Lymphatics: No cervical, supraclavicular, axillary, or inguinal nodes Resp: Lungs clear bilaterally Cardio: Regular rate and rhythm GI: No hepatosplenomegaly, no mass, nontender Vascular: No leg edema Neuro: 2-3/5 strength in the feet and legs, normal arm strength bilaterally   Portacath/PICC-without erythema  Lab Results:  Lab Results  Component Value Date   WBC 6.0 10/10/2020   HGB 7.9 (L) 10/10/2020   HCT 25.2 (L) 10/10/2020   MCV 106.8 (H) 10/10/2020   PLT 147 (L) 10/10/2020   NEUTROABS 7.2 09/27/2020    CMP  Lab  Results  Component Value Date   NA 142 10/08/2020   K 3.8 10/08/2020   CL 103 10/08/2020   CO2 30 10/08/2020   GLUCOSE 94 10/08/2020   BUN 30 (H) 10/08/2020   CREATININE 0.83 10/08/2020   CALCIUM 8.5 (L) 10/08/2020   PROT 5.4 (L) 10/08/2020   ALBUMIN 3.0 (L) 10/08/2020   AST 12 (L) 10/08/2020   ALT 22 10/08/2020   ALKPHOS 58 10/08/2020   BILITOT 1.1 10/08/2020   GFRNONAA >60 10/08/2020   GFRAA >60 08/06/2020    No results found for: CEA1  Lab Results  Component Value Date   INR 1.0 09/27/2020    Imaging:  No results found.  Medications: I have reviewed the patient's current medications.   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 Biopsy 08/30/2019 confirmed well-differentiated squamous cell carcinoma with positive P 16 and p63 stains ? CTs 09/14/2019-abnormal soft tissue fullness at the lower anus/perianal soft tissues, asymmetric left inguinal lymphadenopathy, borderline enlarged left external iliac node, mild stranding and cutaneous thickening of the left gluteal fold, 2 to 3 mm pulmonary nodules, 1.6 cm right hepatic lesion-likely a hemangioma ? PET scan 09/27/2019-highly hypermetabolic anal mass. 3 hypermetabolic left inguinal lymph nodes. Left external iliac node with minimally higher than blood pool activity. 1.0 x 0.8 cm left thyroid nodule with activity mildly above background blood pool activity but below liver activity. No perceptible hypermetabolic activity in the vicinity of the lateral right hepatic lobe lesion seen  on the 09/14/2019 CT. ? Began concurrent chemoRT with 5FU/mitomycin on 10/03/2019 ? Cycle25-FU, dose reduced, mitomycin held 11/07/2019 ? Radiation completed 11/24/2019 ? CT abdomen/pelvis 12/27/2019-extensive left-sided colonic diverticulosis without diverticulitis. No bowel obstruction. No abscess in the abdomen or pelvis. Rectum borderline distended with stool without perirectal wall thickening or soft tissue stranding.  Left inguinal and external iliac lymph nodes now subcentimeter compared to the previous size. Stable small lesion in the right lobe of the liver. No new liver lesions evident. 2. COPD 3. Fibromyalgia 4. Chronic back pain 5. Hypothyroid 6. Depression 7. History of uterine cancer-age 26 8. Pain secondary to #1 9. Orthostatic hypotension, fatigue, dyspnea on exertion, early mucositis requiring supportive care on day 9, secondary to chemotherapy  10. Thrombocytopenia PLT 58K and neutropenia ANC 1.4 on day 9, secondary to chemotherapy 11. Worsening cytopenias, PLT 8K and ANC 0.0 on day 15, secondary to chemotherapy. Started prophylaxis with cipro on 10/22 day 11 12. Hospital admission 10/17/2019 -fatigue, pancytopenia, hypokalemia 13.Left thyroid nodule with activity mildly above background blood pool activity butbelowliver activity on PET scan 09/27/2019.Thyroid ultrasound219 2021-1.6 cm nodule left inferior thyroid gland. [id number] FNA-benign follicular nodule. 14.Tiny lung nodules noted on chest CT 09/14/2019 15. Falls, back/leg pain and leg weakness-CT lumbar spine 05/26/2020 with no acute abnormality within the lumbar spine; multilevel degenerative spondylosis with resultant mild canal with bilateral lateral recess stenosis at L4-5; moderate bilateral L4 and L5 foraminal stenosis related to disc bulge, reactive endplate changes and facet degeneration; soft tissue density/stranding involving the presacral space anterior to the sacrum, incompletely visualized  MRI thoracic and lumbar spine 06/14/2020-no mass or fracture, degenerative changes in the thoracic and lumbar spine  MRI brain 06/21/2020-no acute abnormality  MRI cervical, thoracic and lumbar spine 06/21/2020-no abnormal cervical cord signal, mild cervical degenerative changes without high-grade stenosis, no significant abnormal enhancement in the thoracolumbar spine.  MRI pelvis 06/21/2020-no clearly defined mass or asymmetry in the  anal/perianal region or adjacent rectum, mild but new presacral edema within the iliacus muscles, piriformis muscles, central gluteus muscles, and bilateral hip adductor musculature in a symmetric fashion. This is also accompanied by low-grade enhancement. This may reflect myositis, and association with prior regional radiation therapy is not excluded.  Trial of prednisone 60 mg daily 06/22/2020, tapered by radiation oncology, dose increased to 40 mg daily 08/06/2020, tapered to 30 mg daily beginning 09/13/2020 16.  Left-sided pulmonary embolism 09/27/2020 Doppler 09/28/2020-age-indeterminate DVT in the right posterior tibial veins 17.  Admission 09/27/2020 with progressive leg weakness    Disposition: Joanne Klein has a history of anal cancer.  She remains in clinical remission.  She has developed severe bilateral leg pain and weakness over the past few years of unclear etiology.  She continues evaluation with neurology and is scheduled for a muscle biopsy in the near future.  She was noted to have anemia, a Hemoccult positive stool, and vitamin B12 deficiency during a hospital admission in October.  She will begin home vitamin B12 replacement.  She did not wish to stay at the Cancer center for a lab evaluation today.  We will arrange for a home CBC and vitamin B12 level via her home care agency.  Joanne Klein will schedule a follow-up appointment with Dr. Marcello Moores for anal surveillance.  She will return for an office visit here in 6 months.  I am available to see here in the interim as needed.  Betsy Coder, MD  12/24/2020  3:33 PM

## 2020-12-25 ENCOUNTER — Encounter: Payer: Self-pay | Admitting: *Deleted

## 2020-12-25 ENCOUNTER — Telehealth: Payer: Self-pay | Admitting: Oncology

## 2020-12-25 DIAGNOSIS — D630 Anemia in neoplastic disease: Secondary | ICD-10-CM | POA: Diagnosis not present

## 2020-12-25 DIAGNOSIS — E785 Hyperlipidemia, unspecified: Secondary | ICD-10-CM | POA: Diagnosis not present

## 2020-12-25 DIAGNOSIS — K219 Gastro-esophageal reflux disease without esophagitis: Secondary | ICD-10-CM | POA: Diagnosis not present

## 2020-12-25 DIAGNOSIS — E039 Hypothyroidism, unspecified: Secondary | ICD-10-CM | POA: Diagnosis not present

## 2020-12-25 DIAGNOSIS — M5136 Other intervertebral disc degeneration, lumbar region: Secondary | ICD-10-CM | POA: Diagnosis not present

## 2020-12-25 DIAGNOSIS — Z9989 Dependence on other enabling machines and devices: Secondary | ICD-10-CM | POA: Diagnosis not present

## 2020-12-25 DIAGNOSIS — F32A Depression, unspecified: Secondary | ICD-10-CM | POA: Diagnosis not present

## 2020-12-25 DIAGNOSIS — G4733 Obstructive sleep apnea (adult) (pediatric): Secondary | ICD-10-CM | POA: Diagnosis not present

## 2020-12-25 DIAGNOSIS — I10 Essential (primary) hypertension: Secondary | ICD-10-CM | POA: Diagnosis not present

## 2020-12-25 DIAGNOSIS — M797 Fibromyalgia: Secondary | ICD-10-CM | POA: Diagnosis not present

## 2020-12-25 DIAGNOSIS — C218 Malignant neoplasm of overlapping sites of rectum, anus and anal canal: Secondary | ICD-10-CM | POA: Diagnosis not present

## 2020-12-25 NOTE — Progress Notes (Signed)
Faxed orders for CBC/diff, Vitamin B12 level, and sample to blood bank to [facility] out of [facility]. This is the office her care is provided from according to her niece. Fax [phone]

## 2020-12-25 NOTE — Telephone Encounter (Signed)
Scheduled appointment per 1/3 los. Spoke to patient who is aware of appointment date and time.  

## 2020-12-26 ENCOUNTER — Ambulatory Visit: Payer: Self-pay | Admitting: General Surgery

## 2020-12-26 DIAGNOSIS — Z85048 Personal history of other malignant neoplasm of rectum, rectosigmoid junction, and anus: Secondary | ICD-10-CM | POA: Diagnosis not present

## 2020-12-26 DIAGNOSIS — E039 Hypothyroidism, unspecified: Secondary | ICD-10-CM | POA: Diagnosis not present

## 2020-12-26 DIAGNOSIS — M5136 Other intervertebral disc degeneration, lumbar region: Secondary | ICD-10-CM | POA: Diagnosis not present

## 2020-12-26 DIAGNOSIS — G4733 Obstructive sleep apnea (adult) (pediatric): Secondary | ICD-10-CM | POA: Diagnosis not present

## 2020-12-26 DIAGNOSIS — R29898 Other symptoms and signs involving the musculoskeletal system: Secondary | ICD-10-CM | POA: Diagnosis not present

## 2020-12-26 DIAGNOSIS — E785 Hyperlipidemia, unspecified: Secondary | ICD-10-CM | POA: Diagnosis not present

## 2020-12-26 DIAGNOSIS — C218 Malignant neoplasm of overlapping sites of rectum, anus and anal canal: Secondary | ICD-10-CM | POA: Diagnosis not present

## 2020-12-26 DIAGNOSIS — M797 Fibromyalgia: Secondary | ICD-10-CM | POA: Diagnosis not present

## 2020-12-26 DIAGNOSIS — Z9989 Dependence on other enabling machines and devices: Secondary | ICD-10-CM | POA: Diagnosis not present

## 2020-12-26 DIAGNOSIS — D630 Anemia in neoplastic disease: Secondary | ICD-10-CM | POA: Diagnosis not present

## 2020-12-26 DIAGNOSIS — E538 Deficiency of other specified B group vitamins: Secondary | ICD-10-CM | POA: Diagnosis not present

## 2020-12-26 DIAGNOSIS — I10 Essential (primary) hypertension: Secondary | ICD-10-CM | POA: Diagnosis not present

## 2020-12-26 DIAGNOSIS — D649 Anemia, unspecified: Secondary | ICD-10-CM | POA: Diagnosis not present

## 2020-12-26 DIAGNOSIS — K219 Gastro-esophageal reflux disease without esophagitis: Secondary | ICD-10-CM | POA: Diagnosis not present

## 2020-12-26 DIAGNOSIS — F32A Depression, unspecified: Secondary | ICD-10-CM | POA: Diagnosis not present

## 2020-12-26 NOTE — H&P (Signed)
The patient is a 75 year old female who presents with anal pain. 75 year old female who presented to the office with complaints of anal pain and bleeding. She states that she noticed a mass in the area, which had been slowly enlarging. She saw her gastroenterologist and it was felt that the mass may represent a malignancy and therefore she was referred to me. She has a history of tobacco abuse and still uses a vape pen. She does have COPD and arthritis. She underwent a biopsy in the office which showed squamous cell carcinoma. 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 She underwent chemotherapy and radiation for this. She has had significant trouble with the side effects, but this is slowly improving. Her final treatment was November 24, 2019. She has been somewhat lost to follow-up due to difficulty with lower extremity paralysis. She is being seen by a neurologist for this. She is here today for evaluation for muscle biopsy. She states that she has weakness of both lower extremities, left greater than right.   Problem List/Past Medical Romie Levee, MD; 12/26/2020 10:55 AM) PERIANAL MASS (K62.89) HISTORY OF ANAL CANCER (Z85.048) WEAKNESS OF BOTH LOWER EXTREMITIES (Z61.096)  Past Surgical History Romie Levee, MD; 12/26/2020 10:55 AM) Appendectomy Hysterectomy (due to cancer) - Partial Oral Surgery Tonsillectomy  Diagnostic Studies History Romie Levee, MD; 12/26/2020 10:55 AM) Colonoscopy 1-5 years ago  Allergies Jaynee Eagles, CMA; 12/26/2020 10:33 AM) CODEINE MORPHINE PROzac *ANTIDEPRESSANTS* Sulfa Antibiotics Allergies Reconciled  Medication History Jaynee Eagles, CMA; 12/26/2020 10:41 AM) oxyCODONE-Acetaminophen (10-325MG  Tablet, Oral) Active. Levothyroxine Sodium ( Tablet, Oral) Active. Omeprazole (40MG  Capsule DR, Oral) Active. Sertraline HCl (100MG  Tablet, Oral)  Active. Polyethylene Glycol (1000 Powder,) Active. Vitamin D-3 (125 MCG(5000 UT) Tablet, Oral) Active. Pregabalin (75MG  Capsule, Oral) Active. Xarelto (20MG  Tablet, Oral) Active. Calcium Carbonate (500MG  Tablet Chewable, Oral) Active. Ondansetron (8MG  Tablet, Oral) Active. Medications Reconciled  Social History , MD; 12/26/2020 10:55 AM) Caffeine use Carbonated beverages. Illicit drug use Remotely quit drug use.  Family History , MD; 12/26/2020 10:55 AM) Alcohol Abuse Brother, Father, Mother. Arthritis Sister.  Pregnancy / Birth History , MD; 12/26/2020 10:55 AM) Age of menopause <47 Contraceptive History Intrauterine device, Oral contraceptives. Gravida 3 Maternal age 28-20  Other Problems 02/23/2021, MD; 12/26/2020 10:55 AM) Anxiety Disorder Arthritis Back Pain Bladder Problems Depression Gastroesophageal Reflux Disease Hemorrhoids Lump In Breast     Review of Systems 02/23/2021 MD; 12/26/2020 10:57 AM) General Not Present- Anorexia, Appetite Loss, Chills and Fever. Respiratory Not Present- Bloody sputum and Difficulty Breathing. Cardiovascular Not Present- Chest Pain. Gastrointestinal Present- Bloody Stool. Not Present- Abdominal Pain. Musculoskeletal Present- Leg Weakness. Neurological Present- Decreased Memory. Not Present- Fainting, Headaches, Numbness, Seizures, Tingling, Tremor, Trouble walking and Weakness.  Vitals 02/23/2021 CMA; 12/26/2020 10:41 AM) 12/26/2020 10:41 AM Weight: 235 lb Height: 66in Body Surface Area: 2.14 m Body Mass Index: 37.93 kg/m  Temp.: 55F  Pulse: 85 (Regular)  P.OX: 90% (Room air) BP: 128/72(Sitting, Left Arm, Standard)        Physical Exam 02/23/2021 MD; 12/26/2020 10:55 AM)  General Mental Status-Alert. General Appearance-Cooperative.  Abdomen Palpation/Percussion Palpation and Percussion of the abdomen reveal -  Soft.    Assessment & Plan 02/23/2021 MD; 12/26/2020 10:52 AM)  HISTORY OF ANAL CANCER (Z85.048) Impression: Patient is having rectal bleeding and is due for anoscopy and surveillance. Given her muscle weakness, she is unable to get  on the exam table today. We decided to perform an anal exam under anesthesia with possible biopsy in the operating room while getting her muscle biopsy.   WEAKNESS OF BOTH LOWER EXTREMITIES (R29.898) Impression: Progressive lower extremity weakness, left greater than right. We were asked to perform a muscle biopsy. I will confirm with Dr. Krista Blue on the laterality. Procedure described in detail to the patient. All questions were answered. Risks include bleeding, hematoma, infection and pain. She is on anticoagulation for a pulmonary embolism. We will obtain clearance to stop this for her muscle biopsy.

## 2020-12-27 NOTE — Telephone Encounter (Signed)
° ° °  Joanne Klein from [facility] calling for status of hospital bed order. She is requesting order be sent to [facility]

## 2020-12-28 ENCOUNTER — Telehealth: Payer: Self-pay | Admitting: Internal Medicine

## 2020-12-28 DIAGNOSIS — E785 Hyperlipidemia, unspecified: Secondary | ICD-10-CM | POA: Diagnosis not present

## 2020-12-28 DIAGNOSIS — Z9989 Dependence on other enabling machines and devices: Secondary | ICD-10-CM | POA: Diagnosis not present

## 2020-12-28 DIAGNOSIS — K219 Gastro-esophageal reflux disease without esophagitis: Secondary | ICD-10-CM | POA: Diagnosis not present

## 2020-12-28 DIAGNOSIS — D630 Anemia in neoplastic disease: Secondary | ICD-10-CM | POA: Diagnosis not present

## 2020-12-28 DIAGNOSIS — G4733 Obstructive sleep apnea (adult) (pediatric): Secondary | ICD-10-CM | POA: Diagnosis not present

## 2020-12-28 DIAGNOSIS — E039 Hypothyroidism, unspecified: Secondary | ICD-10-CM | POA: Diagnosis not present

## 2020-12-28 DIAGNOSIS — M797 Fibromyalgia: Secondary | ICD-10-CM | POA: Diagnosis not present

## 2020-12-28 DIAGNOSIS — C218 Malignant neoplasm of overlapping sites of rectum, anus and anal canal: Secondary | ICD-10-CM | POA: Diagnosis not present

## 2020-12-28 DIAGNOSIS — M5136 Other intervertebral disc degeneration, lumbar region: Secondary | ICD-10-CM | POA: Diagnosis not present

## 2020-12-28 DIAGNOSIS — F32A Depression, unspecified: Secondary | ICD-10-CM | POA: Diagnosis not present

## 2020-12-28 DIAGNOSIS — I10 Essential (primary) hypertension: Secondary | ICD-10-CM | POA: Diagnosis not present

## 2020-12-28 NOTE — Telephone Encounter (Signed)
Order placed on 12/12/20 can be printed and faxed to home health company.

## 2020-12-28 NOTE — Telephone Encounter (Signed)
Patient got lab work completed at another office and her home health nurse said her thyroid levels were low and recommended that she reached out to her doctor and ask if we can change the levothyroxine (SYNTHROID) 75 MCG tablet from 75 mcg to 88 mcg

## 2020-12-30 ENCOUNTER — Other Ambulatory Visit: Payer: Self-pay | Admitting: Internal Medicine

## 2020-12-31 ENCOUNTER — Emergency Department (HOSPITAL_BASED_OUTPATIENT_CLINIC_OR_DEPARTMENT_OTHER): Payer: Medicare Other

## 2020-12-31 ENCOUNTER — Encounter (HOSPITAL_COMMUNITY): Payer: Self-pay

## 2020-12-31 ENCOUNTER — Encounter (HOSPITAL_COMMUNITY): Payer: Medicare Other

## 2020-12-31 ENCOUNTER — Emergency Department (HOSPITAL_COMMUNITY)
Admission: EM | Admit: 2020-12-31 | Discharge: 2021-01-01 | Disposition: A | Discharge status: Home / Self Care | Payer: Medicare Other | Attending: Emergency Medicine | Admitting: Emergency Medicine

## 2020-12-31 DIAGNOSIS — Z85048 Personal history of other malignant neoplasm of rectum, rectosigmoid junction, and anus: Secondary | ICD-10-CM | POA: Diagnosis not present

## 2020-12-31 DIAGNOSIS — D630 Anemia in neoplastic disease: Secondary | ICD-10-CM | POA: Diagnosis not present

## 2020-12-31 DIAGNOSIS — Z87891 Personal history of nicotine dependence: Secondary | ICD-10-CM | POA: Insufficient documentation

## 2020-12-31 DIAGNOSIS — E039 Hypothyroidism, unspecified: Secondary | ICD-10-CM | POA: Diagnosis not present

## 2020-12-31 DIAGNOSIS — R609 Edema, unspecified: Secondary | ICD-10-CM | POA: Diagnosis not present

## 2020-12-31 DIAGNOSIS — R0902 Hypoxemia: Secondary | ICD-10-CM | POA: Diagnosis not present

## 2020-12-31 DIAGNOSIS — C218 Malignant neoplasm of overlapping sites of rectum, anus and anal canal: Secondary | ICD-10-CM | POA: Diagnosis not present

## 2020-12-31 DIAGNOSIS — M797 Fibromyalgia: Secondary | ICD-10-CM | POA: Diagnosis not present

## 2020-12-31 DIAGNOSIS — Z79899 Other long term (current) drug therapy: Secondary | ICD-10-CM | POA: Insufficient documentation

## 2020-12-31 DIAGNOSIS — G4733 Obstructive sleep apnea (adult) (pediatric): Secondary | ICD-10-CM | POA: Diagnosis not present

## 2020-12-31 DIAGNOSIS — I10 Essential (primary) hypertension: Secondary | ICD-10-CM | POA: Diagnosis not present

## 2020-12-31 DIAGNOSIS — F32A Depression, unspecified: Secondary | ICD-10-CM | POA: Diagnosis not present

## 2020-12-31 DIAGNOSIS — R069 Unspecified abnormalities of breathing: Secondary | ICD-10-CM | POA: Diagnosis not present

## 2020-12-31 DIAGNOSIS — M7989 Other specified soft tissue disorders: Secondary | ICD-10-CM

## 2020-12-31 DIAGNOSIS — Z8542 Personal history of malignant neoplasm of other parts of uterus: Secondary | ICD-10-CM | POA: Insufficient documentation

## 2020-12-31 DIAGNOSIS — Z743 Need for continuous supervision: Secondary | ICD-10-CM | POA: Diagnosis not present

## 2020-12-31 DIAGNOSIS — K219 Gastro-esophageal reflux disease without esophagitis: Secondary | ICD-10-CM | POA: Diagnosis not present

## 2020-12-31 DIAGNOSIS — M5136 Other intervertebral disc degeneration, lumbar region: Secondary | ICD-10-CM | POA: Diagnosis not present

## 2020-12-31 DIAGNOSIS — Z9989 Dependence on other enabling machines and devices: Secondary | ICD-10-CM | POA: Diagnosis not present

## 2020-12-31 DIAGNOSIS — R6889 Other general symptoms and signs: Secondary | ICD-10-CM | POA: Diagnosis not present

## 2020-12-31 DIAGNOSIS — E785 Hyperlipidemia, unspecified: Secondary | ICD-10-CM | POA: Diagnosis not present

## 2020-12-31 DIAGNOSIS — J449 Chronic obstructive pulmonary disease, unspecified: Secondary | ICD-10-CM | POA: Diagnosis not present

## 2020-12-31 NOTE — Progress Notes (Signed)
Lower extremity venous has been completed.   Preliminary results in CV Proc.   Joanne Klein 12/31/2020 7:03 PM

## 2020-12-31 NOTE — Discharge Instructions (Addendum)
Your Ultrasound today showed no evidence of blood clots.  As we discussed, it is very important that you keep taking your xarelto.   Please follow up with your primary care doctor.   Return to the Emergency Dept for any worsening swelling in your legs, chest pain, difficulty breathing or any other worsening or concerning symptoms.

## 2020-12-31 NOTE — Telephone Encounter (Signed)
We would need copies of the labs.

## 2020-12-31 NOTE — ED Provider Notes (Signed)
[facility] DEPT Provider Note   CSN: [id number] Arrival date & time: 12/31/20  1758     History Chief Complaint  Patient presents with  . Leg Swelling    Joanne Klein is a 75 y.o. female past history of diverticulitis, anal cancer (finished treatment in 2020), hypertension, hypothyroidism, previous DVT noted in October 2021 who presents for evaluation of concern of left lower extremity swelling.  Patient was brought in by EMS for evaluation of some worsening left lower leg swelling.  She reports that she has history of weakness in those legs secondary to previous cancer treatments.  She states she has not noted any significant swelling but her physical therapist was concerned that there was some worsening swelling and wanted her to get evaluated for blood clot.  She reports she had a blood clot in the right leg in October 2021 and is on blood thinners.  She reports that she has been compliant and that she has not missed any doses.  She does report pain in bilateral legs but states that is normal for her and states that she has not had any worsening symptoms at baseline.  She has not had any chest pain, difficulty breathing.   The history is provided by the patient.       Past Medical History:  Diagnosis Date  . Allergic rhinitis   . Arthritis   . Bursitis    Both Shoulders  . Chronic low back pain   . Degenerative disk disease   . Depression   . Diverticulitis   . Emphysema of lung ([place])   . Fibromyalgia   . GERD (gastroesophageal reflux disease)   . History of fibromyalgia   . Hypertension   . Hypothyroidism   . Irritable bowel syndrome   . Neuralgia, post-herpetic   . Obesity, unspecified   . Obstructive sleep apnea   . OCD (obsessive compulsive disorder)   . Pain in joint, site unspecified   . Pure hypercholesterolemia   . Scoliosis   . Situational stress   . Spastic colon   . Tubular adenoma   . Uterine cancer [facility])     Patient  Active Problem List   Diagnosis Date Noted  . Gait abnormality 10/25/2020  . Vitamin B12 deficiency 10/25/2020  . Heme positive stool   . Acute blood loss anemia   . History of anal cancer   . History of pulmonary embolism   . Pulmonary embolism ([place]) 09/27/2020  . Pain   . Lumbar radiculopathy   . Weakness of both legs 06/19/2020  . Falls frequently 06/19/2020  . Weakness 06/19/2020  . Chronic respiratory failure with hypoxia ([place]) 12/15/2019  . Orthostatic hypotension 12/15/2019  . Numbness and tingling 12/15/2019  . Chemotherapy-induced neutropenia ([place])   . Thrombocytopenia ([place]) 10/17/2019  . Anal cancer ([place]) 09/15/2019  . Routine general medical examination at a health care facility 11/19/2016  . 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 Morbid obesity ([place]) 06/26/2014  . COPD GOLD II    . Hyperlipidemia   . Arthritis 01/16/2014  . Depression 01/13/2014  . Hypothyroidism 01/13/2014  . GERD (gastroesophageal reflux disease) 01/13/2014  . OCD (obsessive compulsive disorder) 01/13/2014  . Fibromyalgia 01/13/2014    Past Surgical History:  Procedure Laterality Date  . BIOPSY  10/08/2020   Procedure: BIOPSY;  Surgeon: Jackquline Denmark, MD;  Location: [facility];  Service: Gastroenterology;;  . BREAST LUMPECTOMY     knot removed  . CHOLECYSTECTOMY    . COLONOSCOPY WITH  PROPOFOL N/A 10/08/2020   Procedure: COLONOSCOPY WITH PROPOFOL;  Surgeon: Jackquline Denmark, MD;  Location: [facility];  Service: Gastroenterology;  Laterality: N/A;  . ESOPHAGOGASTRODUODENOSCOPY (EGD) WITH PROPOFOL N/A 10/08/2020   Procedure: ESOPHAGOGASTRODUODENOSCOPY (EGD) WITH PROPOFOL;  Surgeon: Jackquline Denmark, MD;  Location: [facility];  Service: Gastroenterology;  Laterality: N/A;  . HOT HEMOSTASIS N/A 10/08/2020   Procedure: HOT HEMOSTASIS (ARGON PLASMA COAGULATION/BICAP);  Surgeon: Jackquline Denmark, MD;  Location: [facility];  Service: Gastroenterology;  Laterality: N/A;  . HYSTEROTOMY    . POLYPECTOMY  10/08/2020    Procedure: POLYPECTOMY;  Surgeon: Jackquline Denmark, MD;  Location: [facility];  Service: Gastroenterology;;  . RADIOLOGY WITH ANESTHESIA Bilateral 06/14/2020   Procedure: MRI WITH ANESTHESIA;  Surgeon: Radiologist, Medication, MD;  Location: [facility];  Service: Radiology;  Laterality: Bilateral;  . RADIOLOGY WITH ANESTHESIA N/A 06/21/2020   Procedure: MRI WITH ANESTHESIA;  Surgeon: Radiologist, Medication, MD;  Location: [facility];  Service: Radiology;  Laterality: N/A;  . TONSILLECTOMY    . tumor in left forearm       OB History   No obstetric history on file.     Family History  Problem Relation Age of Onset  . Stomach cancer Father   . Liver cancer Father   . Diabetes Father   . Other Mother        unsure of medical history  . Colon cancer Neg Hx     Social History   Tobacco Use  . Smoking status: Former Smoker    Packs/day: 2.00    Years: 34.00    Pack years: 68.00    Types: Cigarettes    Quit date: 12/15/1996    Years since quitting: 24.0  . Smokeless tobacco: Never Used  Vaping Use  . Vaping Use: Every day  . Substances: Nicotine  Substance Use Topics  . Alcohol use: Not Currently  . Drug use: No    Home Medications Prior to Admission medications   Medication Sig Start Date End Date Taking? Authorizing Provider  calcium carbonate (TUMS - DOSED IN MG ELEMENTAL CALCIUM) 500 MG chewable tablet Chew 1 tablet by mouth daily as needed for indigestion or heartburn.     [provider]  Cholecalciferol (VITAMIN D-3) 1000 UNITS CAPS Take 1,000 Units by mouth daily.     [provider]  levothyroxine (SYNTHROID) 75 MCG tablet TAKE 1 TABLET BY MOUTH  DAILY BEFORE BREAKFAST Patient taking differently: Take 75 mcg by mouth daily before breakfast. 02/07/20   Hoyt Koch, MD  omeprazole (PRILOSEC) 40 MG capsule TAKE 1 CAPSULE BY MOUTH  DAILY Patient taking differently: Take 40 mg by mouth daily. 02/07/20   Hoyt Koch, MD  ondansetron (ZOFRAN) 8  MG tablet Take 8 mg by mouth every 8 (eight) hours as needed for nausea or vomiting.    [provider]  oxyCODONE 10 MG TABS Take 1 tablet (10 mg total) by mouth every 4 (four) hours. 10/10/20   Georgette Shell, MD  phenylephrine-shark liver oil-mineral oil-petrolatum (PREPARATION H) 0.25-3-14-71.9 % rectal ointment Place 1 application rectally 2 (two) times daily as needed for hemorrhoids.    [provider]  Polyethylene Glycol 3350 (MIRALAX PO) Take 17 g by mouth at bedtime.     [provider]  pregabalin (LYRICA) 75 MG capsule Take 1 capsule (75 mg total) by mouth 2 (two) times daily. 10/10/20   Georgette Shell, MD  RIVAROXABAN Alveda Reasons) VTE STARTER PACK (15 & 20 MG TABLETS) Follow package  directions: Take one 15mg  tablet by mouth twice a day till October 18, 2020 then start taking 1 tablet daily. Take with food. 10/10/20   Georgette Shell, MD  sertraline (ZOLOFT) 100 MG tablet TAKE 2 TABLETS BY MOUTH AT  BEDTIME Patient taking differently: Take 200 mg by mouth every evening. 02/07/20   Hoyt Koch, MD  vitamin B-12 (CYANOCOBALAMIN) 1000 MCG tablet Take 1,000 mcg by mouth daily. 12/24/20   [provider]    Allergies    Prozac [fluoxetine hcl], Codeine, Morphine and related, and Sulfa antibiotics  Review of Systems   Review of Systems  Constitutional: Negative for fever.  Respiratory: Negative for cough and shortness of breath.   Cardiovascular: Positive for leg swelling. Negative for chest pain.  Gastrointestinal: Negative for abdominal pain, nausea and vomiting.  Skin: Negative for color change.  All other systems reviewed and are negative.   Physical Exam Updated Vital Signs BP 134/65   Pulse 85   Temp 98.2 F (36.8 C)   Resp 17   LMP  (LMP Unknown)   SpO2 97%   Physical Exam Vitals and nursing note reviewed.  Constitutional:      Appearance: Normal appearance. She is well-developed and well-nourished.  HENT:      Head: Normocephalic and atraumatic.     Mouth/Throat:     Mouth: Oropharynx is clear and moist and mucous membranes are normal.  Eyes:     General: Lids are normal.     Extraocular Movements: EOM normal.     Conjunctiva/sclera: Conjunctivae normal.     Pupils: Pupils are equal, round, and reactive to light.  Cardiovascular:     Rate and Rhythm: Normal rate and regular rhythm.     Pulses: Normal pulses.          Dorsalis pedis pulses are 2+ on the right side and 2+ on the left side.     Heart sounds: Normal heart sounds. No murmur heard. No friction rub. No gallop.   Pulmonary:     Effort: Pulmonary effort is normal.     Breath sounds: Normal breath sounds.     Comments: Lungs clear to auscultation bilaterally.  Symmetric chest rise.  No wheezing, rales, rhonchi. Abdominal:     Palpations: Abdomen is soft. Abdomen is not rigid.     Tenderness: There is no abdominal tenderness. There is no guarding.  Musculoskeletal:        General: Normal range of motion.     Cervical back: Full passive range of motion without pain.     Comments: Non-pitting edema noted bilaterally. Left lower extremity is slightly more edematous than right lower extremity.  No overlying warmth, erythema.  She has a small scabbed over dry scaly lesion noted to the lateral aspect.  No overlying warmth, erythema.  No active drainage.  Skin:    General: Skin is warm and dry.     Capillary Refill: Capillary refill takes less than 2 seconds.     Comments: Good distal cap refill. BLE are not dusky in appearance or cool to touch. Good distal cap refill.  BLE is not dusky in appearance or cool to touch.  Neurological:     Mental Status: She is alert and oriented to person, place, and time.     Comments: Sensation intact along major nerve distributions of BLE  Psychiatric:        Mood and Affect: Mood and affect normal.        Speech: Speech  normal.     ED Results / Procedures / Treatments   Labs (all labs ordered are  listed, but only abnormal results are displayed) Labs Reviewed - No data to display  EKG None  Radiology VAS [place] LOWER EXTREMITY VENOUS (DVT) ([facility] 7a-7p)  Result Date: 12/31/2020  Lower Venous DVT Study Indications: Swelling, and Edema.  Limitations: Pain tolerance. Comparison Study: 09/28/20 previous Performing Technologist: Abram Sander RVS  Examination Guidelines: A complete evaluation includes B-mode imaging, spectral Doppler, color Doppler, and power Doppler as needed of all accessible portions of each vessel. Bilateral testing is considered an integral part of a complete examination. Limited examinations for reoccurring indications may be performed as noted. The reflux portion of the exam is performed with the patient in reverse Trendelenburg.  +---------+---------------+---------+-----------+----------+--------------+ RIGHT    CompressibilityPhasicitySpontaneityPropertiesThrombus Aging +---------+---------------+---------+-----------+----------+--------------+ CFV      Full           Yes      Yes                                 +---------+---------------+---------+-----------+----------+--------------+ SFJ      Full                                                        +---------+---------------+---------+-----------+----------+--------------+ FV Prox  Full                                                        +---------+---------------+---------+-----------+----------+--------------+ FV Mid                  Yes                                          +---------+---------------+---------+-----------+----------+--------------+ FV Distal               Yes                                          +---------+---------------+---------+-----------+----------+--------------+ PFV      Full                                                        +---------+---------------+---------+-----------+----------+--------------+ POP      Full           Yes       Yes                                 +---------+---------------+---------+-----------+----------+--------------+ PTV      Full                                                        +---------+---------------+---------+-----------+----------+--------------+  PERO     Full                                                        +---------+---------------+---------+-----------+----------+--------------+   +---------+---------------+---------+-----------+----------+--------------+ LEFT     CompressibilityPhasicitySpontaneityPropertiesThrombus Aging +---------+---------------+---------+-----------+----------+--------------+ CFV      Full           Yes      Yes                                 +---------+---------------+---------+-----------+----------+--------------+ SFJ      Full                                                        +---------+---------------+---------+-----------+----------+--------------+ FV Prox  Full                                                        +---------+---------------+---------+-----------+----------+--------------+ FV Mid                  Yes      Yes                                 +---------+---------------+---------+-----------+----------+--------------+ FV Distal               Yes      Yes                                 +---------+---------------+---------+-----------+----------+--------------+ PFV      Full                                                        +---------+---------------+---------+-----------+----------+--------------+ POP      Full           Yes      Yes                                 +---------+---------------+---------+-----------+----------+--------------+ PTV      Full                                                        +---------+---------------+---------+-----------+----------+--------------+ PERO     Full                                                         +---------+---------------+---------+-----------+----------+--------------+  Summary: BILATERAL: - No evidence of deep vein thrombosis seen in the lower extremities, bilaterally. - No evidence of superficial venous thrombosis in the lower extremities, bilaterally. -No evidence of popliteal cyst, bilaterally.   *See table(s) above for measurements and observations. Electronically signed by Jamelle Haring on 12/31/2020 at 7:26:48 PM.    Final     Procedures Procedures (including critical care time)  Medications Ordered in ED Medications - No data to display  ED Course  I have reviewed the triage vital signs and the nursing notes.  Pertinent labs & imaging results that were available during my care of the patient were reviewed by me and considered in my medical decision making (see chart for details).    MDM Rules/Calculators/A&P                          75 year old female who presents for evaluation of leg swelling.  She reports a history of DVT in the right lower extremity in October.  PT, they have noticed that her left lower leg is slightly more swollen and they wanted to get it checked out for evaluation of blood clot.  Patient reports she is on Xarelto and has been taking it.  She does not feel like it is swollen.  She states she was has pain in her legs and has not noted any worsening pain.  She denies any chest pain, difficulty breathing.  On initial arrival, she is afebrile nontoxic-appearing.  Vital signs are stable.  On exam, left lower extremity slightly more swollen than right.  She has some nonpitting edema noted bilateral lower extremities.  Left is slightly greater than right.  There is a area of scaly dry skin on the lateral aspect but no wound, ulcer that would be concerning for infectious process.  History/physical exam not concerning for ischemic limb, septic arthritis.  No overlying warmth, erythema.  No open wounds.  We will plan to obtain Dopplers.  Bilateral ultrasounds  negative for DVT.  I discussed with patient.  Patient does not know how long she is post be on the Xarelto but I instructed her to keep taking it as directed.  At this time, exam not concerning for infectious process such as cellulitis.  Additionally, history/physical exam not concerning for ischemic limb.  Patient is hemodynamically stable at this time.  She states that she does not have any other symptoms.  We will plan to discharge her home.  Patient instructed follow-up with her primary care doctor. At this time, patient exhibits no emergent life-threatening condition that require further evaluation in ED. Patient had ample opportunity for questions and discussion. All patient's questions were answered with full understanding. Strict return precautions discussed. Patient expresses understanding and agreement to plan.   Portions of this note were generated with Lobbyist. Dictation errors may occur despite best attempts at proofreading.   Final Clinical Impression(s) / ED Diagnoses Final diagnoses:  Leg swelling    Rx / DC Orders ED Discharge Orders    None       Desma Mcgregor 12/31/20 2132    Varney Biles, MD 12/31/20 2134

## 2020-12-31 NOTE — ED Triage Notes (Signed)
Pt BIB EMS, per EMS, physical therapy states that pt has some new left leg swelling. Physical therapy states that she may have a blood clot and it was recommended by her doctor to be checked out.   Vitals BP 150/90 HR 86 O2 87 on room air (pt states that this is her norm) O2 2L [place] 96% R 18

## 2021-01-01 ENCOUNTER — Telehealth: Payer: Self-pay | Admitting: Internal Medicine

## 2021-01-01 ENCOUNTER — Telehealth: Payer: Self-pay | Admitting: Neurology

## 2021-01-01 DIAGNOSIS — Z743 Need for continuous supervision: Secondary | ICD-10-CM | POA: Diagnosis not present

## 2021-01-01 DIAGNOSIS — R609 Edema, unspecified: Secondary | ICD-10-CM | POA: Diagnosis not present

## 2021-01-01 DIAGNOSIS — M2550 Pain in unspecified joint: Secondary | ICD-10-CM | POA: Diagnosis not present

## 2021-01-01 DIAGNOSIS — Z7401 Bed confinement status: Secondary | ICD-10-CM | POA: Diagnosis not present

## 2021-01-01 MED ORDER — OXYCODONE HCL 5 MG PO TABS
10.0000 mg | ORAL_TABLET | Freq: Once | ORAL | Status: AC
  Administered 2021-01-01: 10 mg via ORAL
  Filled 2021-01-01: qty 2

## 2021-01-01 NOTE — Telephone Encounter (Signed)
Reviewed note from [place] [facility] by Dr. Leighton Ruff dated December 26, 2020 muscle biopsy of left vastus lateralis  Anal pain, bleeding, mass in the area, slowly enlarging, pending had anal examination under anesthesia with biopsy in the operating room

## 2021-01-01 NOTE — Telephone Encounter (Signed)
[facility] FYI   Caller states with PT now and Her physical therapist thinks she has more blood clots in her legs. Spoke with Daleen Snook [facility] PT who is with pt currently - clinical observation now LLL anterior portion red area behind knee and calf area, whole leg has dark redness color is new excessive swelling, +homen sign, denies fever, hx nerve pain, 3+ pitting edema, no drainage yet. She is on blood thinners. Last dx of blood clot 9wks ago, same leg. Pt states doesn't feel any different, feels the same. Pt has not been out of bed in 5 days. Pt denies any new sx, feels the same as usual.   [facility] advised: See PCP within 4 hours   Patient understood but decided to go to ED on 1.10.2022

## 2021-01-02 ENCOUNTER — Telehealth: Payer: Self-pay | Admitting: Internal Medicine

## 2021-01-02 ENCOUNTER — Telehealth: Payer: Self-pay | Admitting: Oncology

## 2021-01-02 DIAGNOSIS — D630 Anemia in neoplastic disease: Secondary | ICD-10-CM | POA: Diagnosis not present

## 2021-01-02 DIAGNOSIS — E039 Hypothyroidism, unspecified: Secondary | ICD-10-CM | POA: Diagnosis not present

## 2021-01-02 DIAGNOSIS — K219 Gastro-esophageal reflux disease without esophagitis: Secondary | ICD-10-CM | POA: Diagnosis not present

## 2021-01-02 DIAGNOSIS — F32A Depression, unspecified: Secondary | ICD-10-CM | POA: Diagnosis not present

## 2021-01-02 DIAGNOSIS — I10 Essential (primary) hypertension: Secondary | ICD-10-CM | POA: Diagnosis not present

## 2021-01-02 DIAGNOSIS — G4733 Obstructive sleep apnea (adult) (pediatric): Secondary | ICD-10-CM | POA: Diagnosis not present

## 2021-01-02 DIAGNOSIS — M5136 Other intervertebral disc degeneration, lumbar region: Secondary | ICD-10-CM | POA: Diagnosis not present

## 2021-01-02 DIAGNOSIS — Z9989 Dependence on other enabling machines and devices: Secondary | ICD-10-CM | POA: Diagnosis not present

## 2021-01-02 DIAGNOSIS — C218 Malignant neoplasm of overlapping sites of rectum, anus and anal canal: Secondary | ICD-10-CM | POA: Diagnosis not present

## 2021-01-02 DIAGNOSIS — E785 Hyperlipidemia, unspecified: Secondary | ICD-10-CM | POA: Diagnosis not present

## 2021-01-02 DIAGNOSIS — M797 Fibromyalgia: Secondary | ICD-10-CM | POA: Diagnosis not present

## 2021-01-02 NOTE — Telephone Encounter (Signed)
Hospital bed order faxed to [facility] health [phone]

## 2021-01-02 NOTE — Telephone Encounter (Signed)
Anissa from [facility] calling, she saw the patient today, reporting that the patient went to the ED on 01.10.22. Patient was negative for DVT so she was wondering if the patient could be ordered some compression stockings. Patient still has bilateral edema and 2+ edema in the left and since she is non ambulatory they think this could help Anissa- [phone] Okay to LVM

## 2021-01-02 NOTE — Telephone Encounter (Signed)
I called Dr. Gearldine Shown office and left message for medical records to fax [place] any recent labs they have for patient.     I spoke to [facility] and she states she sent [place] a form from [facility] for hospital bed.   Pryor Curia or Alexia [phone] may can help with recent thyroid labs.

## 2021-01-02 NOTE — Telephone Encounter (Signed)
Release: [id number]  Released records to [facility] to [phone]

## 2021-01-02 NOTE — Telephone Encounter (Addendum)
I called pt- she does not remember who did the most recent labs. She states Dr. Benay Spice should have the result.    [place] [phone]

## 2021-01-02 NOTE — Telephone Encounter (Signed)
Order printed and faxed to [facility] health DME @ [phone].

## 2021-01-03 ENCOUNTER — Telehealth: Payer: Self-pay | Admitting: Internal Medicine

## 2021-01-03 DIAGNOSIS — G4733 Obstructive sleep apnea (adult) (pediatric): Secondary | ICD-10-CM | POA: Diagnosis not present

## 2021-01-03 DIAGNOSIS — M797 Fibromyalgia: Secondary | ICD-10-CM | POA: Diagnosis not present

## 2021-01-03 DIAGNOSIS — D630 Anemia in neoplastic disease: Secondary | ICD-10-CM | POA: Diagnosis not present

## 2021-01-03 DIAGNOSIS — E785 Hyperlipidemia, unspecified: Secondary | ICD-10-CM | POA: Diagnosis not present

## 2021-01-03 DIAGNOSIS — Z9989 Dependence on other enabling machines and devices: Secondary | ICD-10-CM | POA: Diagnosis not present

## 2021-01-03 DIAGNOSIS — K219 Gastro-esophageal reflux disease without esophagitis: Secondary | ICD-10-CM | POA: Diagnosis not present

## 2021-01-03 DIAGNOSIS — C218 Malignant neoplasm of overlapping sites of rectum, anus and anal canal: Secondary | ICD-10-CM | POA: Diagnosis not present

## 2021-01-03 DIAGNOSIS — F32A Depression, unspecified: Secondary | ICD-10-CM | POA: Diagnosis not present

## 2021-01-03 DIAGNOSIS — E039 Hypothyroidism, unspecified: Secondary | ICD-10-CM | POA: Diagnosis not present

## 2021-01-03 DIAGNOSIS — M5136 Other intervertebral disc degeneration, lumbar region: Secondary | ICD-10-CM | POA: Diagnosis not present

## 2021-01-03 DIAGNOSIS — I10 Essential (primary) hypertension: Secondary | ICD-10-CM | POA: Diagnosis not present

## 2021-01-03 MED ORDER — MEDICAL COMPRESSION STOCKINGS MISC: 0 refills | Status: DC

## 2021-01-03 NOTE — Telephone Encounter (Signed)
° °  Joanne Klein from [facility] calling to report patient has low oxygen reading Currently between 80-85% on room air Minimal shortness of breath  Oxygen has been ordered by Oncology, however it has not arrived at this time Joanne Klein calling to make Dr Sharlet Salina aware Phone [phone]

## 2021-01-03 NOTE — Telephone Encounter (Signed)
Do they know if the home oxygen will be arriving anytime soon?

## 2021-01-03 NOTE — Telephone Encounter (Addendum)
Verbals given to Anissa for below. She needs specific compression strength on a written order faxed to [phone].

## 2021-01-03 NOTE — Telephone Encounter (Signed)
Per Ailene Ravel the patient stated "Oncology mentioned ordering O2", but it doesn't appear to have been ordered.   I called pt and informed her to call [phone] ASAP if her O2 sat drops any lower or her SOB worsens. Patient verbalized understanding and has a pulse ox at home to check O2 sat.

## 2021-01-03 NOTE — Telephone Encounter (Signed)
Signed prescription faxed to number below. See meds.

## 2021-01-03 NOTE — Telephone Encounter (Signed)
Okay to give verbal order for compression stockings.

## 2021-01-03 NOTE — Addendum Note (Signed)
Addended by: Pricilla Holm A on: 01/03/2021 11:43 AM   Modules accepted: Orders

## 2021-01-04 DIAGNOSIS — Z9989 Dependence on other enabling machines and devices: Secondary | ICD-10-CM | POA: Diagnosis not present

## 2021-01-04 DIAGNOSIS — K219 Gastro-esophageal reflux disease without esophagitis: Secondary | ICD-10-CM | POA: Diagnosis not present

## 2021-01-04 DIAGNOSIS — F32A Depression, unspecified: Secondary | ICD-10-CM | POA: Diagnosis not present

## 2021-01-04 DIAGNOSIS — D630 Anemia in neoplastic disease: Secondary | ICD-10-CM | POA: Diagnosis not present

## 2021-01-04 DIAGNOSIS — G4733 Obstructive sleep apnea (adult) (pediatric): Secondary | ICD-10-CM | POA: Diagnosis not present

## 2021-01-04 DIAGNOSIS — M5136 Other intervertebral disc degeneration, lumbar region: Secondary | ICD-10-CM | POA: Diagnosis not present

## 2021-01-04 DIAGNOSIS — C218 Malignant neoplasm of overlapping sites of rectum, anus and anal canal: Secondary | ICD-10-CM | POA: Diagnosis not present

## 2021-01-04 DIAGNOSIS — E785 Hyperlipidemia, unspecified: Secondary | ICD-10-CM | POA: Diagnosis not present

## 2021-01-04 DIAGNOSIS — M797 Fibromyalgia: Secondary | ICD-10-CM | POA: Diagnosis not present

## 2021-01-04 DIAGNOSIS — I10 Essential (primary) hypertension: Secondary | ICD-10-CM | POA: Diagnosis not present

## 2021-01-04 DIAGNOSIS — E039 Hypothyroidism, unspecified: Secondary | ICD-10-CM | POA: Diagnosis not present

## 2021-01-04 NOTE — Telephone Encounter (Signed)
I would recommend if any more O2 levels <88 needs to go to ER as this is not a chronic issue for her.

## 2021-01-08 ENCOUNTER — Telehealth: Payer: Self-pay | Admitting: Internal Medicine

## 2021-01-08 NOTE — Telephone Encounter (Signed)
[facility] [place], [place] Phone:  [phone]  Fax:  [phone]      RIVAROXABAN Alveda Reasons) VTE STARTER PACK (15 & 20 MG TABLETS) pregabalin (LYRICA) 75 MG capsule Patient wondering if she still needs to be taking these medications, and if she does can we send in a refill

## 2021-01-09 ENCOUNTER — Other Ambulatory Visit: Payer: Self-pay | Admitting: Internal Medicine

## 2021-01-09 ENCOUNTER — Telehealth: Payer: Self-pay | Admitting: Internal Medicine

## 2021-01-09 NOTE — Telephone Encounter (Signed)
See below

## 2021-01-09 NOTE — Telephone Encounter (Signed)
     Joanne Klein w/ [facility] called and was wondering the status of the hospital bed, she said that the medical supply store needed more documentation from Dr. Sharlet Salina.

## 2021-01-10 ENCOUNTER — Telehealth: Payer: Self-pay | Admitting: Internal Medicine

## 2021-01-10 MED ORDER — RIVAROXABAN 20 MG PO TABS: 20.0000 mg | ORAL_TABLET | Freq: Every day | ORAL | 5 refills | Status: DC

## 2021-01-10 NOTE — Telephone Encounter (Signed)
Okay, would recommend to stay off lyrica. Resume xarelto 20 mg (1 pill) daily.

## 2021-01-10 NOTE — Telephone Encounter (Signed)
Spoke with the patient and she stated that is not currently taking xarelto or lyrica because she doesn't remember how she was taking them before.

## 2021-01-10 NOTE — Telephone Encounter (Signed)
Patient was made aware via mychart message.

## 2021-01-10 NOTE — Telephone Encounter (Signed)
See below

## 2021-01-10 NOTE — Telephone Encounter (Signed)
I am not sure if there is a question for me?

## 2021-01-10 NOTE — Telephone Encounter (Signed)
    Ailene Ravel from [facility] calling to report patient missed appointment 01/10/21

## 2021-01-10 NOTE — Telephone Encounter (Signed)
The started pack of xarelto lasts only a month and was given in October, is she still taking? She should be taking xarelto 20 mg daily which I have sent in refill. Also the last refill I see of lyrica is also 1 month supply from October so is she taking this or how is she taking it (1 pill daily etc). Lyrica is a pain medicine so if she is not taking then she can stay off this medication. Let me know about lyrica and we can send in refills if needed.

## 2021-01-11 DIAGNOSIS — E785 Hyperlipidemia, unspecified: Secondary | ICD-10-CM | POA: Diagnosis not present

## 2021-01-11 DIAGNOSIS — I10 Essential (primary) hypertension: Secondary | ICD-10-CM | POA: Diagnosis not present

## 2021-01-11 DIAGNOSIS — Z9989 Dependence on other enabling machines and devices: Secondary | ICD-10-CM | POA: Diagnosis not present

## 2021-01-11 DIAGNOSIS — M5136 Other intervertebral disc degeneration, lumbar region: Secondary | ICD-10-CM | POA: Diagnosis not present

## 2021-01-11 DIAGNOSIS — E039 Hypothyroidism, unspecified: Secondary | ICD-10-CM | POA: Diagnosis not present

## 2021-01-11 DIAGNOSIS — K219 Gastro-esophageal reflux disease without esophagitis: Secondary | ICD-10-CM | POA: Diagnosis not present

## 2021-01-11 DIAGNOSIS — G4733 Obstructive sleep apnea (adult) (pediatric): Secondary | ICD-10-CM | POA: Diagnosis not present

## 2021-01-11 DIAGNOSIS — D630 Anemia in neoplastic disease: Secondary | ICD-10-CM | POA: Diagnosis not present

## 2021-01-11 DIAGNOSIS — C218 Malignant neoplasm of overlapping sites of rectum, anus and anal canal: Secondary | ICD-10-CM | POA: Diagnosis not present

## 2021-01-11 DIAGNOSIS — M797 Fibromyalgia: Secondary | ICD-10-CM | POA: Diagnosis not present

## 2021-01-11 DIAGNOSIS — F32A Depression, unspecified: Secondary | ICD-10-CM | POA: Diagnosis not present

## 2021-01-11 NOTE — Telephone Encounter (Signed)
We do not have any visit to justify ordering oxygen. Insurance companies have rules about this and to my knowledge is not a chronic issue for her and would need to be evaluated why she is having the low levels. Can have visit in the office if she is able to leave or we would need documentation from home health of her oxygen levels to order this and would recommend virtual visit at least to assess.

## 2021-01-11 NOTE — Telephone Encounter (Signed)
See below

## 2021-01-11 NOTE — Telephone Encounter (Signed)
Please call the patient and offer a in office or virtual visit to discuss her needing oxygen.

## 2021-01-11 NOTE — Telephone Encounter (Signed)
Patient calling to check to see if we ordered oxygen for her, or if we are going to order it for her.

## 2021-01-11 NOTE — Telephone Encounter (Signed)
Patients daughter calling, states they still haven't received the hospital bed. Daughter calling crying and upset because she doesn't understand why no one is doing anything for the patient. States she wants someone to call her and let them know what they need to do from here.

## 2021-01-14 ENCOUNTER — Telehealth (INDEPENDENT_AMBULATORY_CARE_PROVIDER_SITE_OTHER): Payer: Medicare Other | Admitting: Internal Medicine

## 2021-01-14 ENCOUNTER — Encounter: Payer: Self-pay | Admitting: Internal Medicine

## 2021-01-14 DIAGNOSIS — J9611 Chronic respiratory failure with hypoxia: Secondary | ICD-10-CM | POA: Diagnosis not present

## 2021-01-14 MED ORDER — PREGABALIN 75 MG PO CAPS: 75.0000 mg | ORAL_CAPSULE | Freq: Two times a day (BID) | ORAL | 1 refills | Status: DC

## 2021-01-14 NOTE — Assessment & Plan Note (Signed)
Ordered oxygen for 2L continuous based on home oxygen saturation readings supplied by home health. 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 She continue her xarelto 20 mg daily for this.

## 2021-01-14 NOTE — Progress Notes (Signed)
Virtual Visit via Video Note  I connected with Joanne Klein on 01/14/21 at 10:20 AM EST by a video enabled telemedicine application and verified that I am speaking with the correct person using two identifiers.  The patient and the provider were at separate locations throughout the entire encounter. Patient location: home, Provider location: work   I discussed the limitations of evaluation and management by telemedicine and the availability of in person appointments. The patient expressed understanding and agreed to proceed. The patient and the provider were the only parties present for the visit unless noted in HPI below.  History of Present Illness: The patient is a 75 y.o. female with visit for low oxygen levels via home health. O2 level below 80% at home through home health. Has  Is also still waiting on hospital bed. Ordered Dec 12, 2020 and she still has not gotten this. Has been told that her leg weakness and inability to walk will likely be lifelong. Denies change to this recently. Overall it is stable.   Observations/Objective: Appearance: normal, breathing appears normal, casual grooming, abdomen does not appear distended, throat not well visualized, memory normal, mental status is A and O times 3  Assessment and Plan: See problem oriented charting  Follow Up Instructions: oxygen needed at 2 L oxygen all the time, will fill out forms for hospital bed  O2 saturation at rest 80%, on 2L O2 95%  I discussed the assessment and treatment plan with the patient. The patient was provided an opportunity to ask questions and all were answered. The patient agreed with the plan and demonstrated an understanding of the instructions.   The patient was advised to call back or seek an in-person evaluation if the symptoms worsen or if the condition fails to improve as anticipated.  Hoyt Koch, MD

## 2021-01-14 NOTE — Telephone Encounter (Signed)
Paperwork and office notes for hospital bed/oxygen has been faxed to [facility]. Confirmation fax has been received

## 2021-01-17 ENCOUNTER — Telehealth: Payer: Self-pay | Admitting: Internal Medicine

## 2021-01-17 ENCOUNTER — Telehealth: Payer: Self-pay | Admitting: Oncology

## 2021-01-17 DIAGNOSIS — J449 Chronic obstructive pulmonary disease, unspecified: Secondary | ICD-10-CM | POA: Diagnosis not present

## 2021-01-17 DIAGNOSIS — C210 Malignant neoplasm of anus, unspecified: Secondary | ICD-10-CM | POA: Diagnosis not present

## 2021-01-17 NOTE — Telephone Encounter (Signed)
Release: [id number] Faxed medical records to [facility] @ fax# [phone]

## 2021-01-17 NOTE — Telephone Encounter (Signed)
Ailene Ravel a occupational therapist from [facility] calling to make [place] aware that the patient declined her OT visit

## 2021-01-17 NOTE — Telephone Encounter (Signed)
See below

## 2021-01-21 ENCOUNTER — Encounter: Payer: Self-pay | Admitting: *Deleted

## 2021-01-21 NOTE — Progress Notes (Signed)
Received request for surgical clearance from Dr. Marcello Moores regarding Xarelto. Per DR. Sherrill: He did not put her on Xarelto. Need to contact PCP, Dr. Sharlet Salina for this clearance. Faxed request w/MD response to [facility] att: Garth Schlatter at [phone]

## 2021-01-22 ENCOUNTER — Telehealth: Payer: Self-pay | Admitting: Internal Medicine

## 2021-01-22 DIAGNOSIS — C218 Malignant neoplasm of overlapping sites of rectum, anus and anal canal: Secondary | ICD-10-CM | POA: Diagnosis not present

## 2021-01-22 DIAGNOSIS — E785 Hyperlipidemia, unspecified: Secondary | ICD-10-CM | POA: Diagnosis not present

## 2021-01-22 DIAGNOSIS — Z9989 Dependence on other enabling machines and devices: Secondary | ICD-10-CM | POA: Diagnosis not present

## 2021-01-22 DIAGNOSIS — E039 Hypothyroidism, unspecified: Secondary | ICD-10-CM | POA: Diagnosis not present

## 2021-01-22 DIAGNOSIS — D630 Anemia in neoplastic disease: Secondary | ICD-10-CM | POA: Diagnosis not present

## 2021-01-22 DIAGNOSIS — I10 Essential (primary) hypertension: Secondary | ICD-10-CM | POA: Diagnosis not present

## 2021-01-22 DIAGNOSIS — G4733 Obstructive sleep apnea (adult) (pediatric): Secondary | ICD-10-CM | POA: Diagnosis not present

## 2021-01-22 DIAGNOSIS — M797 Fibromyalgia: Secondary | ICD-10-CM | POA: Diagnosis not present

## 2021-01-22 DIAGNOSIS — M5136 Other intervertebral disc degeneration, lumbar region: Secondary | ICD-10-CM | POA: Diagnosis not present

## 2021-01-22 DIAGNOSIS — F32A Depression, unspecified: Secondary | ICD-10-CM | POA: Diagnosis not present

## 2021-01-22 DIAGNOSIS — K219 Gastro-esophageal reflux disease without esophagitis: Secondary | ICD-10-CM | POA: Diagnosis not present

## 2021-01-22 NOTE — Telephone Encounter (Signed)
   Patient calling to report she has not received oxygen as discussed 01/24  Also patient requesting pregabalin (LYRICA) 75 MG capsule be sent to [facility]

## 2021-01-24 DIAGNOSIS — K219 Gastro-esophageal reflux disease without esophagitis: Secondary | ICD-10-CM | POA: Diagnosis not present

## 2021-01-24 DIAGNOSIS — G4733 Obstructive sleep apnea (adult) (pediatric): Secondary | ICD-10-CM | POA: Diagnosis not present

## 2021-01-24 DIAGNOSIS — F32A Depression, unspecified: Secondary | ICD-10-CM | POA: Diagnosis not present

## 2021-01-24 DIAGNOSIS — C218 Malignant neoplasm of overlapping sites of rectum, anus and anal canal: Secondary | ICD-10-CM | POA: Diagnosis not present

## 2021-01-24 DIAGNOSIS — D630 Anemia in neoplastic disease: Secondary | ICD-10-CM | POA: Diagnosis not present

## 2021-01-24 DIAGNOSIS — E785 Hyperlipidemia, unspecified: Secondary | ICD-10-CM | POA: Diagnosis not present

## 2021-01-24 DIAGNOSIS — Z9989 Dependence on other enabling machines and devices: Secondary | ICD-10-CM | POA: Diagnosis not present

## 2021-01-24 DIAGNOSIS — M797 Fibromyalgia: Secondary | ICD-10-CM | POA: Diagnosis not present

## 2021-01-24 DIAGNOSIS — M5136 Other intervertebral disc degeneration, lumbar region: Secondary | ICD-10-CM | POA: Diagnosis not present

## 2021-01-24 DIAGNOSIS — I10 Essential (primary) hypertension: Secondary | ICD-10-CM | POA: Diagnosis not present

## 2021-01-24 DIAGNOSIS — E039 Hypothyroidism, unspecified: Secondary | ICD-10-CM | POA: Diagnosis not present

## 2021-01-25 ENCOUNTER — Other Ambulatory Visit: Payer: Self-pay | Admitting: Internal Medicine

## 2021-01-25 ENCOUNTER — Telehealth: Payer: Self-pay | Admitting: Pharmacist

## 2021-01-25 NOTE — Progress Notes (Signed)
Chronic Care Management Pharmacy Assistant   Name: Joanne Klein  MRN: [id number] DOB: 17-Sep-1946  Reason for Encounter: General Adherence Call   PCP : Joanne Koch, MD  Allergies:   Allergies  Allergen Reactions   Prozac [Fluoxetine Hcl] Anaphylaxis   Codeine Itching   Morphine And Related Itching   Sulfa Antibiotics Itching and Nausea Only    Medications: Outpatient Encounter Medications as of 01/25/2021  Medication Sig   calcium carbonate (TUMS - DOSED IN MG ELEMENTAL CALCIUM) 500 MG chewable tablet Chew 1 tablet by mouth daily as needed for indigestion or heartburn.    Cholecalciferol (VITAMIN D-3) 1000 UNITS CAPS Take 1,000 Units by mouth daily.    Elastic Bandages & Supports (MEDICAL COMPRESSION STOCKINGS) MISC Use daily   levothyroxine (SYNTHROID) 75 MCG tablet TAKE 1 TABLET BY MOUTH  DAILY BEFORE BREAKFAST   omeprazole (PRILOSEC) 40 MG capsule TAKE 1 CAPSULE BY MOUTH  DAILY   ondansetron (ZOFRAN) 8 MG tablet Take 8 mg by mouth every 8 (eight) hours as needed for nausea or vomiting.   oxyCODONE 10 MG TABS Take 1 tablet (10 mg total) by mouth every 4 (four) hours.   phenylephrine-shark liver oil-mineral oil-petrolatum (PREPARATION H) 0.25-3-14-71.9 % rectal ointment Place 1 application rectally 2 (two) times daily as needed for hemorrhoids.   Polyethylene Glycol 3350 (MIRALAX PO) Take 17 g by mouth at bedtime.    pregabalin (LYRICA) 75 MG capsule Take 1 capsule (75 mg total) by mouth 2 (two) times daily.   rivaroxaban (XARELTO) 20 MG TABS tablet Take 1 tablet (20 mg total) by mouth daily with supper.   sertraline (ZOLOFT) 100 MG tablet TAKE 2 TABLETS BY MOUTH AT  BEDTIME   vitamin B-12 (CYANOCOBALAMIN) 1000 MCG tablet Take 1,000 mcg by mouth daily.   No facility-administered encounter medications on file as of 01/25/2021.    Current Diagnosis: Patient Active Problem List   Diagnosis Date Noted   Gait abnormality 10/25/2020   Vitamin B12  deficiency 10/25/2020   Heme positive stool    Acute blood loss anemia    History of anal cancer    History of pulmonary embolism    Pulmonary embolism ([place]) 09/27/2020   Pain    Lumbar radiculopathy    Weakness of both legs 06/19/2020   Falls frequently 06/19/2020   Weakness 06/19/2020   Chronic respiratory failure with hypoxia (HCC) 12/15/2019   Orthostatic hypotension 12/15/2019   Numbness and tingling 12/15/2019   Chemotherapy-induced neutropenia (HCC)    Thrombocytopenia ([place]) 10/17/2019   Anal cancer ([place]) 09/15/2019   Routine general medical examination at a health care facility 11/19/2016   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  The patient states that she has been doing well despite being paralyzed and in pain. She did say that she had started back on Lyrica and noticed that the jerking she was experiencing has come back since she started it. The patient states that she stopped taking it and would like to find  something else that she can take in place of the Lyrica. The patient states that that she did go to the ED because her legs was swelling but is not having any swelling now. She is not having any side effects from medication since she stopped the Lyrica. I let the patient know that I will pass along the information to [place].   Wendy Poet, [place] [phone]

## 2021-01-25 NOTE — Telephone Encounter (Signed)
Script was faxed with confirmation fax received.

## 2021-01-25 NOTE — Telephone Encounter (Signed)
Please give her a follow-up visit with me in mid April 2022, after she stopped her Xarelto, to evaluate if she still needed muscle biopsy   Leighton Ruff, MD  Marcial Pacas, MD Per Dr Sharlet Salina, she cannot come off her anticoagulation until April. Do you still want me to do the biopsy then?   Elmo Putt        Previous Messages   ----- Message -----  From: Marcial Pacas, MD  Sent: 12/31/2020 33:82 AM EST  To: Leighton Ruff, MD  Subject: RE: muscle biopsy                 Dr. Marcello Moores:   Thank you for your help. It is Ok to biopsy her left vastus lateralis.

## 2021-01-25 NOTE — Telephone Encounter (Signed)
I spoke to the patient and she scheduled a follow up on 04/02/21 with Dr. Krista Blue.

## 2021-01-28 NOTE — Telephone Encounter (Signed)
Patient called and said that she has not received any oxygen yet and she is requesting a call back at [phone].

## 2021-01-30 NOTE — Telephone Encounter (Signed)
Patient calling back stating they still haven't received the paperwork for the oxygen

## 2021-02-01 DIAGNOSIS — I10 Essential (primary) hypertension: Secondary | ICD-10-CM | POA: Diagnosis not present

## 2021-02-01 DIAGNOSIS — Z9989 Dependence on other enabling machines and devices: Secondary | ICD-10-CM | POA: Diagnosis not present

## 2021-02-01 DIAGNOSIS — G4733 Obstructive sleep apnea (adult) (pediatric): Secondary | ICD-10-CM | POA: Diagnosis not present

## 2021-02-01 DIAGNOSIS — E039 Hypothyroidism, unspecified: Secondary | ICD-10-CM | POA: Diagnosis not present

## 2021-02-01 DIAGNOSIS — E785 Hyperlipidemia, unspecified: Secondary | ICD-10-CM | POA: Diagnosis not present

## 2021-02-01 DIAGNOSIS — K219 Gastro-esophageal reflux disease without esophagitis: Secondary | ICD-10-CM | POA: Diagnosis not present

## 2021-02-01 DIAGNOSIS — F32A Depression, unspecified: Secondary | ICD-10-CM | POA: Diagnosis not present

## 2021-02-01 DIAGNOSIS — M797 Fibromyalgia: Secondary | ICD-10-CM | POA: Diagnosis not present

## 2021-02-01 DIAGNOSIS — C218 Malignant neoplasm of overlapping sites of rectum, anus and anal canal: Secondary | ICD-10-CM | POA: Diagnosis not present

## 2021-02-01 DIAGNOSIS — M5136 Other intervertebral disc degeneration, lumbar region: Secondary | ICD-10-CM | POA: Diagnosis not present

## 2021-02-01 DIAGNOSIS — D630 Anemia in neoplastic disease: Secondary | ICD-10-CM | POA: Diagnosis not present

## 2021-02-05 ENCOUNTER — Telehealth: Payer: Self-pay | Admitting: Internal Medicine

## 2021-02-05 DIAGNOSIS — I10 Essential (primary) hypertension: Secondary | ICD-10-CM | POA: Diagnosis not present

## 2021-02-05 DIAGNOSIS — M797 Fibromyalgia: Secondary | ICD-10-CM | POA: Diagnosis not present

## 2021-02-05 DIAGNOSIS — M5136 Other intervertebral disc degeneration, lumbar region: Secondary | ICD-10-CM | POA: Diagnosis not present

## 2021-02-05 DIAGNOSIS — Z9989 Dependence on other enabling machines and devices: Secondary | ICD-10-CM | POA: Diagnosis not present

## 2021-02-05 DIAGNOSIS — D630 Anemia in neoplastic disease: Secondary | ICD-10-CM | POA: Diagnosis not present

## 2021-02-05 DIAGNOSIS — K219 Gastro-esophageal reflux disease without esophagitis: Secondary | ICD-10-CM | POA: Diagnosis not present

## 2021-02-05 DIAGNOSIS — C218 Malignant neoplasm of overlapping sites of rectum, anus and anal canal: Secondary | ICD-10-CM | POA: Diagnosis not present

## 2021-02-05 DIAGNOSIS — E039 Hypothyroidism, unspecified: Secondary | ICD-10-CM | POA: Diagnosis not present

## 2021-02-05 DIAGNOSIS — E785 Hyperlipidemia, unspecified: Secondary | ICD-10-CM | POA: Diagnosis not present

## 2021-02-05 DIAGNOSIS — G4733 Obstructive sleep apnea (adult) (pediatric): Secondary | ICD-10-CM | POA: Diagnosis not present

## 2021-02-05 DIAGNOSIS — F32A Depression, unspecified: Secondary | ICD-10-CM | POA: Diagnosis not present

## 2021-02-05 NOTE — Telephone Encounter (Signed)
Amy a physical therapist with [facility] calling, she did a reassessment on the patient today and she is requesting verbal orders for 2 times a week for 4 weeks. Also if there was any parameters for vitals she would like to know that as well. Amy- [phone] Okay to lvm

## 2021-02-06 NOTE — Telephone Encounter (Signed)
Fine for verbals °

## 2021-02-06 NOTE — Telephone Encounter (Signed)
Ok to give verbal orders? Please advise

## 2021-02-07 NOTE — Telephone Encounter (Signed)
Spoke with Amy from [facility] to give her verbal orders for the patient. No other questions or concerns at this time.

## 2021-02-08 DIAGNOSIS — I119 Hypertensive heart disease without heart failure: Secondary | ICD-10-CM | POA: Diagnosis not present

## 2021-02-08 DIAGNOSIS — C218 Malignant neoplasm of overlapping sites of rectum, anus and anal canal: Secondary | ICD-10-CM | POA: Diagnosis not present

## 2021-02-08 DIAGNOSIS — J439 Emphysema, unspecified: Secondary | ICD-10-CM | POA: Diagnosis not present

## 2021-02-08 DIAGNOSIS — D630 Anemia in neoplastic disease: Secondary | ICD-10-CM | POA: Diagnosis not present

## 2021-02-08 DIAGNOSIS — M5136 Other intervertebral disc degeneration, lumbar region: Secondary | ICD-10-CM | POA: Diagnosis not present

## 2021-02-14 DIAGNOSIS — Z9181 History of falling: Secondary | ICD-10-CM

## 2021-02-14 DIAGNOSIS — Z6835 Body mass index (BMI) 35.0-35.9, adult: Secondary | ICD-10-CM

## 2021-02-14 DIAGNOSIS — F429 Obsessive-compulsive disorder, unspecified: Secondary | ICD-10-CM

## 2021-02-14 DIAGNOSIS — Z8711 Personal history of peptic ulcer disease: Secondary | ICD-10-CM

## 2021-02-14 DIAGNOSIS — I119 Hypertensive heart disease without heart failure: Secondary | ICD-10-CM | POA: Diagnosis not present

## 2021-02-14 DIAGNOSIS — Z79891 Long term (current) use of opiate analgesic: Secondary | ICD-10-CM | POA: Diagnosis not present

## 2021-02-14 DIAGNOSIS — C218 Malignant neoplasm of overlapping sites of rectum, anus and anal canal: Secondary | ICD-10-CM | POA: Diagnosis not present

## 2021-02-14 DIAGNOSIS — G603 Idiopathic progressive neuropathy: Secondary | ICD-10-CM | POA: Diagnosis not present

## 2021-02-14 DIAGNOSIS — J439 Emphysema, unspecified: Secondary | ICD-10-CM | POA: Diagnosis not present

## 2021-02-14 DIAGNOSIS — Z86711 Personal history of pulmonary embolism: Secondary | ICD-10-CM

## 2021-02-14 DIAGNOSIS — M170 Bilateral primary osteoarthritis of knee: Secondary | ICD-10-CM | POA: Diagnosis not present

## 2021-02-14 DIAGNOSIS — G894 Chronic pain syndrome: Secondary | ICD-10-CM | POA: Diagnosis not present

## 2021-02-14 DIAGNOSIS — F32A Depression, unspecified: Secondary | ICD-10-CM | POA: Diagnosis not present

## 2021-02-14 DIAGNOSIS — D630 Anemia in neoplastic disease: Secondary | ICD-10-CM | POA: Diagnosis not present

## 2021-02-14 DIAGNOSIS — G729 Myopathy, unspecified: Secondary | ICD-10-CM

## 2021-02-14 DIAGNOSIS — M5136 Other intervertebral disc degeneration, lumbar region: Secondary | ICD-10-CM | POA: Diagnosis not present

## 2021-02-14 DIAGNOSIS — I959 Hypotension, unspecified: Secondary | ICD-10-CM | POA: Diagnosis not present

## 2021-02-14 DIAGNOSIS — M797 Fibromyalgia: Secondary | ICD-10-CM | POA: Diagnosis not present

## 2021-02-14 DIAGNOSIS — M799 Soft tissue disorder, unspecified: Secondary | ICD-10-CM | POA: Diagnosis not present

## 2021-02-14 DIAGNOSIS — K219 Gastro-esophageal reflux disease without esophagitis: Secondary | ICD-10-CM | POA: Diagnosis not present

## 2021-02-14 DIAGNOSIS — Z7901 Long term (current) use of anticoagulants: Secondary | ICD-10-CM

## 2021-02-17 DIAGNOSIS — J449 Chronic obstructive pulmonary disease, unspecified: Secondary | ICD-10-CM | POA: Diagnosis not present

## 2021-02-17 DIAGNOSIS — C210 Malignant neoplasm of anus, unspecified: Secondary | ICD-10-CM | POA: Diagnosis not present

## 2021-02-18 ENCOUNTER — Telehealth: Payer: Self-pay | Admitting: Internal Medicine

## 2021-02-18 NOTE — Telephone Encounter (Signed)
  ondansetron (ZOFRAN) 8 MG tablet Patient requesting a refill, states she got it from a doctor at the hospital and now shes out. [id number] [address], [place] Phone:  [phone]  Fax:  [phone]     Last seen- 1.24.22 Next apt- n/a

## 2021-02-18 NOTE — Telephone Encounter (Signed)
Ok to refill? Please advise.  

## 2021-02-19 MED ORDER — ONDANSETRON HCL 4 MG PO TABS: 4.0000 mg | ORAL_TABLET | Freq: Three times a day (TID) | ORAL | 0 refills | Status: DC | PRN

## 2021-02-21 NOTE — Telephone Encounter (Signed)
Patient called and is requesting a call back. She was wondering if there was any word for her oxygen.

## 2021-02-22 NOTE — Telephone Encounter (Signed)
Unable to get in contact with the patient. LVM letting her know that we have faxed the order over 3 weeks ago and received a confirmation fax.

## 2021-03-04 ENCOUNTER — Telehealth: Payer: Medicare Other

## 2021-03-04 NOTE — Progress Notes (Deleted)
Chronic Care Management Pharmacy Note  03/04/2021 Name:  Joanne Klein MRN:  [id number] DOB:  August 31, 1946  Subjective: Joanne Klein is an 75 y.o. year old female who is a primary patient of Hoyt Koch, MD.  The CCM team was consulted for assistance with disease management and care coordination needs.    {CCMTELEPHONEFACETOFACE:21091510} for {CCMINITIALFOLLOWUPCHOICE:21091511} in response to provider referral for pharmacy case management and/or care coordination services.   Consent to Services:  {CCMCONSENTOPTIONS:25074}  Patient Care Team: Hoyt Koch, MD as PCP - General (Internal Medicine) Inda Castle, MD (Inactive) (Gastroenterology) Nicholaus Bloom, MD (Anesthesiology) Arna Snipe, RN (Inactive) as Oncology Nurse Navigator Charlton Haws, [facility] as Pharmacist (Pharmacist)  Recent office visits: 01/14/21 Dr Sharlet Salina VV: low O2 levels via home health. Ordered 2L supplemental O2, likely has damage from PE Oct 2021.  12/12/20 Dr Sharlet Salina VV: weakness of both legs, progressive since multiple hospitalizations this year. Ordered hospital bed.  Recent consult visits: 12/24/20 Dr Benay Spice (oncology): f/u anal cancer in remission. Rec'd B12 relacement.  11/21/20 EMG study: abnormal study, significant inflammatory myopathic changes, moderate axonal sensorimotor neuropathy. Proceed with muscle biopsy to clarify dx and prognosis.  10/25/20 [name] (neurology): eval for LE weakness. Ordered EMG study. Rx'd Cymbalta 60 mg. Continue prednisone taper.  Hospital visits: 12/31/20 ED visit: leg swelling. B/L [place] negative for DVT. Discharged to f/u with PCP.  09/27/20 - 10/10/20 admission for PE. Started on Xarelto x at least 6 months.  Objective:  Lab Results  Component Value Date   CREATININE 0.83 10/08/2020   BUN 30 (H) 10/08/2020   GFR 43.88 (L) 02/17/2019   GFRNONAA >60 10/08/2020   GFRAA >60 08/06/2020   NA 142 10/08/2020   K 3.8 10/08/2020   CALCIUM 8.5  (L) 10/08/2020   CO2 30 10/08/2020    Lab Results  Component Value Date/Time   HGBA1C 5.2 02/17/2019 04:37 PM   HGBA1C 5.4 11/20/2017 02:37 PM   GFR 43.88 (L) 02/17/2019 04:37 PM   GFR 52.24 (L) 11/20/2017 02:37 PM    Last diabetic Eye exam: No results found for: HMDIABEYEEXA  Last diabetic Foot exam: No results found for: HMDIABFOOTEX   Lab Results  Component Value Date   CHOL 197 02/17/2019   HDL 50.10 02/17/2019   LDLCALC 119 (H) 02/17/2019   TRIG 138.0 02/17/2019   CHOLHDL 4 02/17/2019    Hepatic Function Latest Ref Rng & Units 10/08/2020 10/07/2020 09/28/2020  Total Protein 6.5 - 8.1 g/dL 5.4(L) 5.9(L) 5.9(L)  Albumin 3.5 - 5.0 g/dL 3.0(L) 3.0(L) 3.5  AST 15 - 41 U/L 12(L) 16 14(L)  ALT 0 - 44 U/L _0 Alk Phosphatase 38 - 126 U/L 58 62 60  Total Bilirubin 0.3 - 1.2 mg/dL 1.1 1.4(H) 1.5(H)  Bilirubin, Direct 0.0 - 0.3 mg/dL - - -    Lab Results  Component Value Date/Time   TSH 14.000 (H) 10/25/2020 11:16 AM   TSH 13.877 (H) 10/04/2020 02:40 PM   TSH 3.125 09/28/2020 05:09 AM   TSH 2.69 02/17/2019 04:37 PM   FREET4 0.90 02/17/2019 04:37 PM   FREET4 0.66 05/16/2016 04:10 PM    CBC Latest Ref Rng & Units 10/10/2020 10/09/2020 10/08/2020  WBC 4.0 - 10.5 K/uL 6.0 7.3 -  Hemoglobin 12.0 - 15.0 g/dL 7.9(L) 7.8(L) 7.9(L)  Hematocrit 36.0 - 46.0 % 25.2(L) 24.2(L) 24.5(L)  Platelets 150 - 400 K/uL 147(L) 120(L) -    Lab Results  Component Value Date/Time  VD25OH 40.08 02/17/2019 04:37 PM   VD25OH 30.04 11/18/2016 04:19 PM    Clinical ASCVD: {YES/NO:21197} The 10-year ASCVD risk score Denman George DC Jr., et al., 2013) is: 14.8%   Values used to calculate the score:     Age: 41 years     Sex: Female     Is Non-Hispanic African American: No     Diabetic: No     Tobacco smoker: No     Systolic Blood Pressure: 145 mmHg     Is BP treated: No     HDL Cholesterol: 50.1 mg/dL     Total Cholesterol: 197 mg/dL    No flowsheet data found.   ***Other: (CHADS2VASc  if Afib, MMRC or CAT for COPD, ACT, DEXA)  Social History   Tobacco Use  Smoking Status Former Smoker  . Packs/day: 2.00  . Years: 34.00  . Pack years: 68.00  . Types: Cigarettes  . Quit date: 12/15/1996  . Years since quitting: 24.2  Smokeless Tobacco Never Used   BP Readings from Last 3 Encounters:  01/01/21 (!) 145/88  12/24/20 102/65  10/25/20 130/72   Pulse Readings from Last 3 Encounters:  01/01/21 84  12/24/20 94  10/25/20 94   Wt Readings from Last 3 Encounters:  10/08/20 220 lb (99.8 kg)  07/29/20 210 lb (95.3 kg)  07/12/20 209 lb (94.8 kg)    Assessment/Interventions: Review of patient past medical history, allergies, medications, health status, including review of consultants reports, laboratory and other test data, was performed as part of comprehensive evaluation and provision of chronic care management services.   SDOH:  (Social Determinants of Health) assessments and interventions performed: {yes/no:20286}   CCM Care Plan  Allergies  Allergen Reactions  . Prozac [Fluoxetine Hcl] Anaphylaxis  . Codeine Itching  . Morphine And Related Itching  . Sulfa Antibiotics Itching and Nausea Only    Medications Reviewed Today    Reviewed by Myrlene Broker, MD (Physician) on 01/14/21 at 1032  Med List Status: <None>  Medication Order Taking? Sig Documenting Provider Last Dose Status Informant  calcium carbonate (TUMS - DOSED IN MG ELEMENTAL CALCIUM) 500 MG chewable tablet [id number] No Chew 1 tablet by mouth daily as needed for indigestion or heartburn.  [provider] Taking Active Self  Cholecalciferol (VITAMIN D-3) 1000 UNITS CAPS [id number] No Take 1,000 Units by mouth daily.  [provider] Taking Active Self  Elastic Bandages & Supports (MEDICAL COMPRESSION STOCKINGS) MISC [id number]  Use daily Myrlene Broker, MD  Active   levothyroxine (SYNTHROID) 75 MCG tablet [id number]  TAKE 1 TABLET BY MOUTH  DAILY BEFORE BREAKFAST Myrlene Broker, MD  Active   omeprazole (PRILOSEC) 40 MG capsule [id number]  TAKE 1 CAPSULE BY MOUTH  DAILY Myrlene Broker, MD  Active   ondansetron (ZOFRAN) 8 MG tablet [id number] No Take 8 mg by mouth every 8 (eight) hours as needed for nausea or vomiting. [provider] Taking Active Self  oxyCODONE 10 MG TABS [id number] No Take 1 tablet (10 mg total) by mouth every 4 (four) hours. Alwyn Ren, MD Taking Active   phenylephrine-shark liver oil-mineral oil-petrolatum (PREPARATION H) 0.25-3-14-71.9 % rectal ointment [id number] No Place 1 application rectally 2 (two) times daily as needed for hemorrhoids. [provider] Taking Active Self  Polyethylene Glycol 3350 (MIRALAX PO) [id number] No Take 17 g by mouth at bedtime.  [provider] Taking Active Self  pregabalin (LYRICA) 75 MG capsule [id number] No Take 1 capsule (75 mg  total) by mouth 2 (two) times daily. Georgette Shell, MD Taking Active   rivaroxaban (XARELTO) 20 MG TABS tablet [id number]  Take 1 tablet (20 mg total) by mouth daily with supper. Hoyt Koch, MD  Active   sertraline (ZOLOFT) 100 MG tablet [id number]  TAKE 2 TABLETS BY MOUTH AT  BEDTIME Hoyt Koch, MD  Active   vitamin B-12 (CYANOCOBALAMIN) 1000 MCG tablet [id number]  Take 1,000 mcg by mouth daily. [provider]  Active           Patient Active Problem List   Diagnosis Date Noted  . Gait abnormality 10/25/2020  . Vitamin B12 deficiency 10/25/2020  . Heme positive stool   . Acute blood loss anemia   . History of anal cancer   . History of pulmonary embolism   . Pulmonary embolism ([place]) 09/27/2020  . Pain   . Lumbar radiculopathy   . Weakness of both legs 06/19/2020  . Falls frequently 06/19/2020  . Weakness 06/19/2020  . Chronic respiratory failure with hypoxia ([place]) 12/15/2019  . Orthostatic hypotension 12/15/2019  . Numbness and tingling 12/15/2019  . Chemotherapy-induced neutropenia ([place])    . Thrombocytopenia ([place]) 10/17/2019  . Anal cancer ([place]) 09/15/2019  . Routine general medical examination at a health care facility 11/19/2016  . 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 Morbid obesity ([place]) 06/26/2014  . COPD GOLD II    . Hyperlipidemia   . Arthritis 01/16/2014  . Depression 01/13/2014  . Hypothyroidism 01/13/2014  . GERD (gastroesophageal reflux disease) 01/13/2014  . OCD (obsessive compulsive disorder) 01/13/2014  . Fibromyalgia 01/13/2014    Immunization History  Administered Date(s) Administered  . Fluad Quad(high Dose 65+) 09/19/2019  . Influenza Split 10/22/2020  . Influenza,inj,Quad PF,6+ Mos 09/29/2014, 10/04/2015  . Influenza-Unspecified 09/15/2011, 09/22/2013, 09/09/2016, 01/02/2018, 09/11/2018  . PFIZER(Purple Top)SARS-COV-2 Vaccination 02/20/2020, 03/20/2020  . Pneumococcal Conjugate-13 10/04/2015  . Pneumococcal Polysaccharide-23 04/17/2014  . Td 01/23/2005  . Tdap 05/16/2016    Conditions to be addressed/monitored:  {USCCMDZASSESSMENTOPTIONS:23563}  There are no care plans that you recently modified to display for this patient.    Medication Assistance: {MEDASSISTANCEINFO:25044}  Patient's preferred pharmacy is:  [facility] [address], [address] [place] [address] Phone: [phone] Fax: [phone]  [place], [address], [address] [place], [address] [place] [address] Phone: [phone] Fax: [phone]  Uses pill box? {Yes or If no, why not?:20788} Pt endorses ***% compliance  We discussed: {Pharmacy options:24294} Patient decided to: {US Pharmacy Plan:23885}  Care Plan and Follow Up Patient Decision:  {FOLLOWUP:24991}  Plan: {CM FOLLOW UP PLAN:25073}  ***    Current Barriers:  . {pharmacybarriers:24917} . ***  Pharmacist Clinical Goal(s):  . patient will  {PHARMACYGOALCHOICES:24921} through collaboration with PharmD and provider.  . ***  Interventions: . 1:1 collaboration with Hoyt Koch, MD regarding development and update of comprehensive plan of care as evidenced by provider attestation and co-signature . Inter-disciplinary care team collaboration (see longitudinal plan of care) . Comprehensive medication review performed; medication list updated in electronic medical record   Hx PE (09/2020)   Patient is currently {CHL Controlled/Uncontrolled:[phone]} on the following medications:  . Xarelto 20 mg daily (planned end date 03/2021)  We discussed:  ***  Plan  Continue {CHL HP Upstream Pharmacy XQJJH:[id number]}   Hyperlipidemia   Patient has failed these meds in past:  n/a Patient is currently controlled on the following medications:   atorvastatin 20 mg QOD - MWF  We discussed:  diet and exercise extensively; pt is taking statin 3 times a week due to fear of side effects, she used to take daily and never had any issues. Discussed benefits of statin, pt agreed to gradually increase statin up to daily.  Plan  Recommended to increase atorvastatin to daily dosing  Hypothyroidism   TSH was elevated after hospitalization for PE, but T4 was WNL.  Patient has failed these meds in past: n/a Patient is currently controlled on the following medications:   levothyroxine 75 mcg qAM - 6:30am, takes with Prilosec, pain med  We discussed:  Symptoms of hypothyroidism, pt is very fatigued but also recovering from radiation. Plans to discuss with oncologist.  Plan  Continue current medications   Depression/OCD   Patient has failed these meds in past:  Patient is currently controlled on the following medications:   sertraline 200 mg (100 mg x 2) HS  We discussed:  Depressed since 75 y/o, reports she has tried most other antidepressants. She still struggle with depression, discussed benefits of adding  something like bupropion. She took that in the past for smoking cessation and reports it didn't help, but may work syngergistically with sertraline. Pt wants to continue current therapy for now but will think about adding bupropion in the future.  Plan  Continue current medications   Neuropathy / Pain   Hx radiation EMG 11/21 abnormal. Awaiting muscle biopsy.  Patient has failed these meds in past: pregabalin 75 mg BID, prednisone Patient is currently {CHL Controlled/Uncontrolled:[phone]} on the following medications:   oxycodone-apap 10-325 mg q4h prn (#180/mo),   We discussed:  ***  Plan  Continue {CHL HP Upstream Pharmacy [phone]}  Hx anal cancer   Patient has failed these meds in past: n/a Patient is currently controlled on the following medications:   prochlorperazine 10 mg q6h prn,   ondanestron 8 mg q8h prn,   loperamide 2 mg PRN,   preparation H  We discussed:  Takes Nausea meds every few weeks, takes compazine for severe nausea. Oxycodone 6 per day, been on for 4 years.   Plan  Continue current medications    Patient Goals/Self-Care Activities . patient will:  - {pharmacypatientgoals:24919}  Follow Up Plan: {CM FOLLOW UP WTUU:82800}

## 2021-03-06 DIAGNOSIS — M5136 Other intervertebral disc degeneration, lumbar region: Secondary | ICD-10-CM | POA: Diagnosis not present

## 2021-03-06 DIAGNOSIS — D630 Anemia in neoplastic disease: Secondary | ICD-10-CM | POA: Diagnosis not present

## 2021-03-06 DIAGNOSIS — J439 Emphysema, unspecified: Secondary | ICD-10-CM | POA: Diagnosis not present

## 2021-03-06 DIAGNOSIS — I119 Hypertensive heart disease without heart failure: Secondary | ICD-10-CM | POA: Diagnosis not present

## 2021-03-06 DIAGNOSIS — C218 Malignant neoplasm of overlapping sites of rectum, anus and anal canal: Secondary | ICD-10-CM | POA: Diagnosis not present

## 2021-03-12 ENCOUNTER — Telehealth: Payer: Self-pay | Admitting: *Deleted

## 2021-03-12 NOTE — Telephone Encounter (Signed)
Per Dr. Benay Spice: Needs to call surgeon, Dr. Marcello Moores about this issue. Patient notified and agrees.

## 2021-03-12 NOTE — Telephone Encounter (Signed)
Called to report she feels a mass/lump near vaginal canal that is size of "end of my thumb". Noted it about 1 week ago. No bleeding or pain unless she rubs area to clean herself. Asking what should be done. Adds that she is paralyzed from waist down and would not be able to get up on an exam table. Is able to get from bed to W/C.  Last visit with Dr. Marcello Moores was January, but that was about having a muscle biopsy.

## 2021-03-17 DIAGNOSIS — C210 Malignant neoplasm of anus, unspecified: Secondary | ICD-10-CM | POA: Diagnosis not present

## 2021-03-17 DIAGNOSIS — J449 Chronic obstructive pulmonary disease, unspecified: Secondary | ICD-10-CM | POA: Diagnosis not present

## 2021-03-21 ENCOUNTER — Telehealth: Payer: Self-pay | Admitting: Internal Medicine

## 2021-03-21 NOTE — Telephone Encounter (Signed)
Kelly from the surgical center, with Dr.Thomas had a quick question about the patient taking Xarelto still.   She was advised that th patient can stop in April, but she wants to know if their is a specific date or if she can stop starting 4.1.22?  Claiborne Billings- [phone]

## 2021-03-22 NOTE — Telephone Encounter (Signed)
I would recommend as of 04/10/21 she can come off xarelto for muscle biopsy but will be on xarelto pending assessment with me then or later.

## 2021-03-22 NOTE — Telephone Encounter (Signed)
See below

## 2021-03-26 ENCOUNTER — Telehealth: Payer: Self-pay | Admitting: Neurology

## 2021-03-26 NOTE — Telephone Encounter (Signed)
I spoke to the patient. Her PCP did want her to stop Plavix for five days until after 04/10/21. She is still unsure if she wants to move forward with the muscle biopsy. She is going to speak more to the surgeon's office about the recovery period then plans to decide.

## 2021-03-26 NOTE — Telephone Encounter (Signed)
Pt is asking for a call to discuss concerns about the biopsy before it is scheduled.  Please call

## 2021-03-26 NOTE — Telephone Encounter (Signed)
I talked with patient, she still has significant weakness, no movement from waist down, she is on xarelto for hx of DVT.  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

## 2021-03-26 NOTE — Telephone Encounter (Signed)
Spoke with Joanne Klein from [facility] and she stated the biopsy would be scheduled after the 04/10/2021. She stated patient will come off of xarelto for 2 days for the biopsy and Dr. Marcello Moores normally starts the patient back on their medication a day after their biopsy. Patient has been made aware. No other concerns at this time.

## 2021-03-28 DIAGNOSIS — I119 Hypertensive heart disease without heart failure: Secondary | ICD-10-CM | POA: Diagnosis not present

## 2021-03-28 DIAGNOSIS — J439 Emphysema, unspecified: Secondary | ICD-10-CM | POA: Diagnosis not present

## 2021-03-28 DIAGNOSIS — M5136 Other intervertebral disc degeneration, lumbar region: Secondary | ICD-10-CM | POA: Diagnosis not present

## 2021-03-28 DIAGNOSIS — C218 Malignant neoplasm of overlapping sites of rectum, anus and anal canal: Secondary | ICD-10-CM | POA: Diagnosis not present

## 2021-03-28 DIAGNOSIS — D630 Anemia in neoplastic disease: Secondary | ICD-10-CM | POA: Diagnosis not present

## 2021-04-02 ENCOUNTER — Ambulatory Visit: Payer: Self-pay | Admitting: Neurology

## 2021-04-15 DIAGNOSIS — G894 Chronic pain syndrome: Secondary | ICD-10-CM | POA: Diagnosis not present

## 2021-04-15 DIAGNOSIS — G603 Idiopathic progressive neuropathy: Secondary | ICD-10-CM | POA: Diagnosis not present

## 2021-04-15 DIAGNOSIS — Z79891 Long term (current) use of opiate analgesic: Secondary | ICD-10-CM | POA: Diagnosis not present

## 2021-04-15 DIAGNOSIS — M797 Fibromyalgia: Secondary | ICD-10-CM | POA: Diagnosis not present

## 2021-04-17 DIAGNOSIS — C210 Malignant neoplasm of anus, unspecified: Secondary | ICD-10-CM | POA: Diagnosis not present

## 2021-04-17 DIAGNOSIS — J449 Chronic obstructive pulmonary disease, unspecified: Secondary | ICD-10-CM | POA: Diagnosis not present

## 2021-04-19 ENCOUNTER — Telehealth: Payer: Self-pay | Admitting: Internal Medicine

## 2021-04-19 NOTE — Telephone Encounter (Signed)
   [employer] requesting office note that has information regarding need for  power wheelchair  Please call [phone] Fax  [phone]

## 2021-04-22 NOTE — Telephone Encounter (Signed)
Ive only found where oxygen at home has been discussed but not a power wheelchair. Please advise

## 2021-04-22 NOTE — Telephone Encounter (Signed)
She has not had PT assessment for power wheelchair and would then need visit with me in person or virtual to discuss results. Neither of those have been done.

## 2021-04-23 NOTE — Telephone Encounter (Signed)
Joanne Klein from [facility] called wanting to speak to the RN to find out if the provider is ok with the pt not going forward with the Muscle Biopsy. Please advise. Please call [phone]

## 2021-04-23 NOTE — Telephone Encounter (Signed)
Unable to get in contact with the patient. LDVM with Dr. Nathanial Millman recommendations. Office number was provided in case she has additional questions or concerns.      If patient calls back please schedule a virtual or in person visit with Dr. Sharlet Salina to discuss PT assessment for power wheelchair.

## 2021-04-24 NOTE — Telephone Encounter (Signed)
I returned the call to [facility] at [facility] and provided her with Dr. Rhea Belton message.

## 2021-04-25 ENCOUNTER — Telehealth: Payer: Self-pay

## 2021-04-25 ENCOUNTER — Telehealth: Payer: Medicare Other | Admitting: Internal Medicine

## 2021-04-25 NOTE — Telephone Encounter (Signed)
Called [employer] to speak with Raquel Sarna. She was currently in a meeting. I left a message with the secretary asking that a copy of her physical therapy assessment be faxed to our office with attention to me. Office fax number was provided.

## 2021-04-25 NOTE — Telephone Encounter (Signed)
See below.   Joanne Klein also spoke with the patient and told her what medicare requires for her to get a wheelchair.

## 2021-04-25 NOTE — Telephone Encounter (Signed)
Joanne Klein with [employer] calling, states there is no paperwork that needs to be filled out, the patient has already been seen by a therapist so now they just need her to have an appointment and discuss the need for the wheel chair Joanne [phone]

## 2021-04-25 NOTE — Telephone Encounter (Signed)
Call back person from [employer] and get the paperwork from PT as this will be needed at our visit to review. Once I receive this we can approve a visit to discuss.

## 2021-04-27 IMAGING — DX DG CHEST 2V
2 series · 2 of 2 positions shown · non-contrast
Comparison: 10/12/2019.

CLINICAL DATA: Neutropenia. Per chart- h/o squamous cell carcinoma
of anal margin, COPD, uterine cancer, emphysema, HTN, and former
smoker. Presented to the hospital with c/o fatigue, mouth pain
secondary to mucositis and spontaneous rectal bleeding after first
chemo treatment.

EXAM:
CHEST - 2 VIEW

[chest lat]
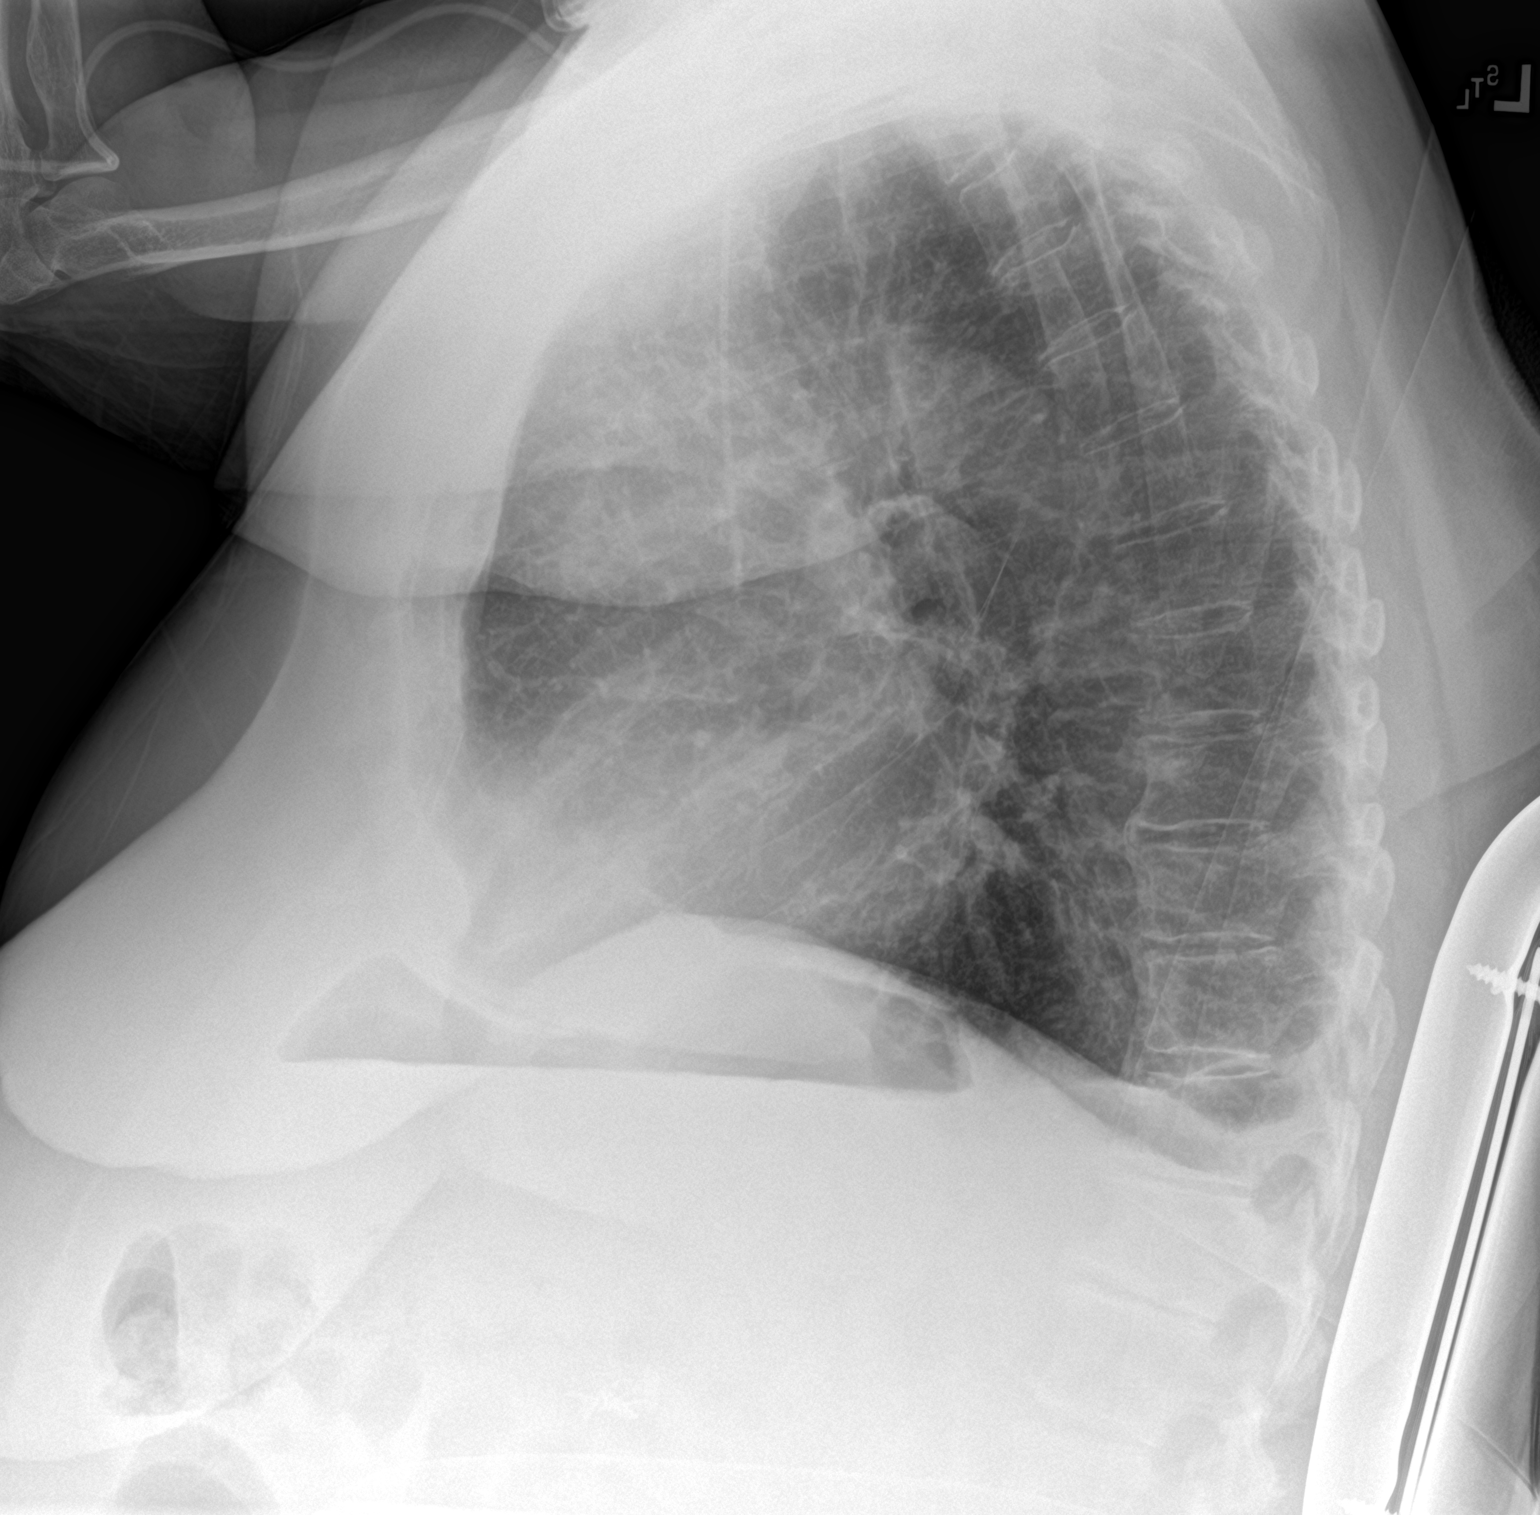

[chest ap]
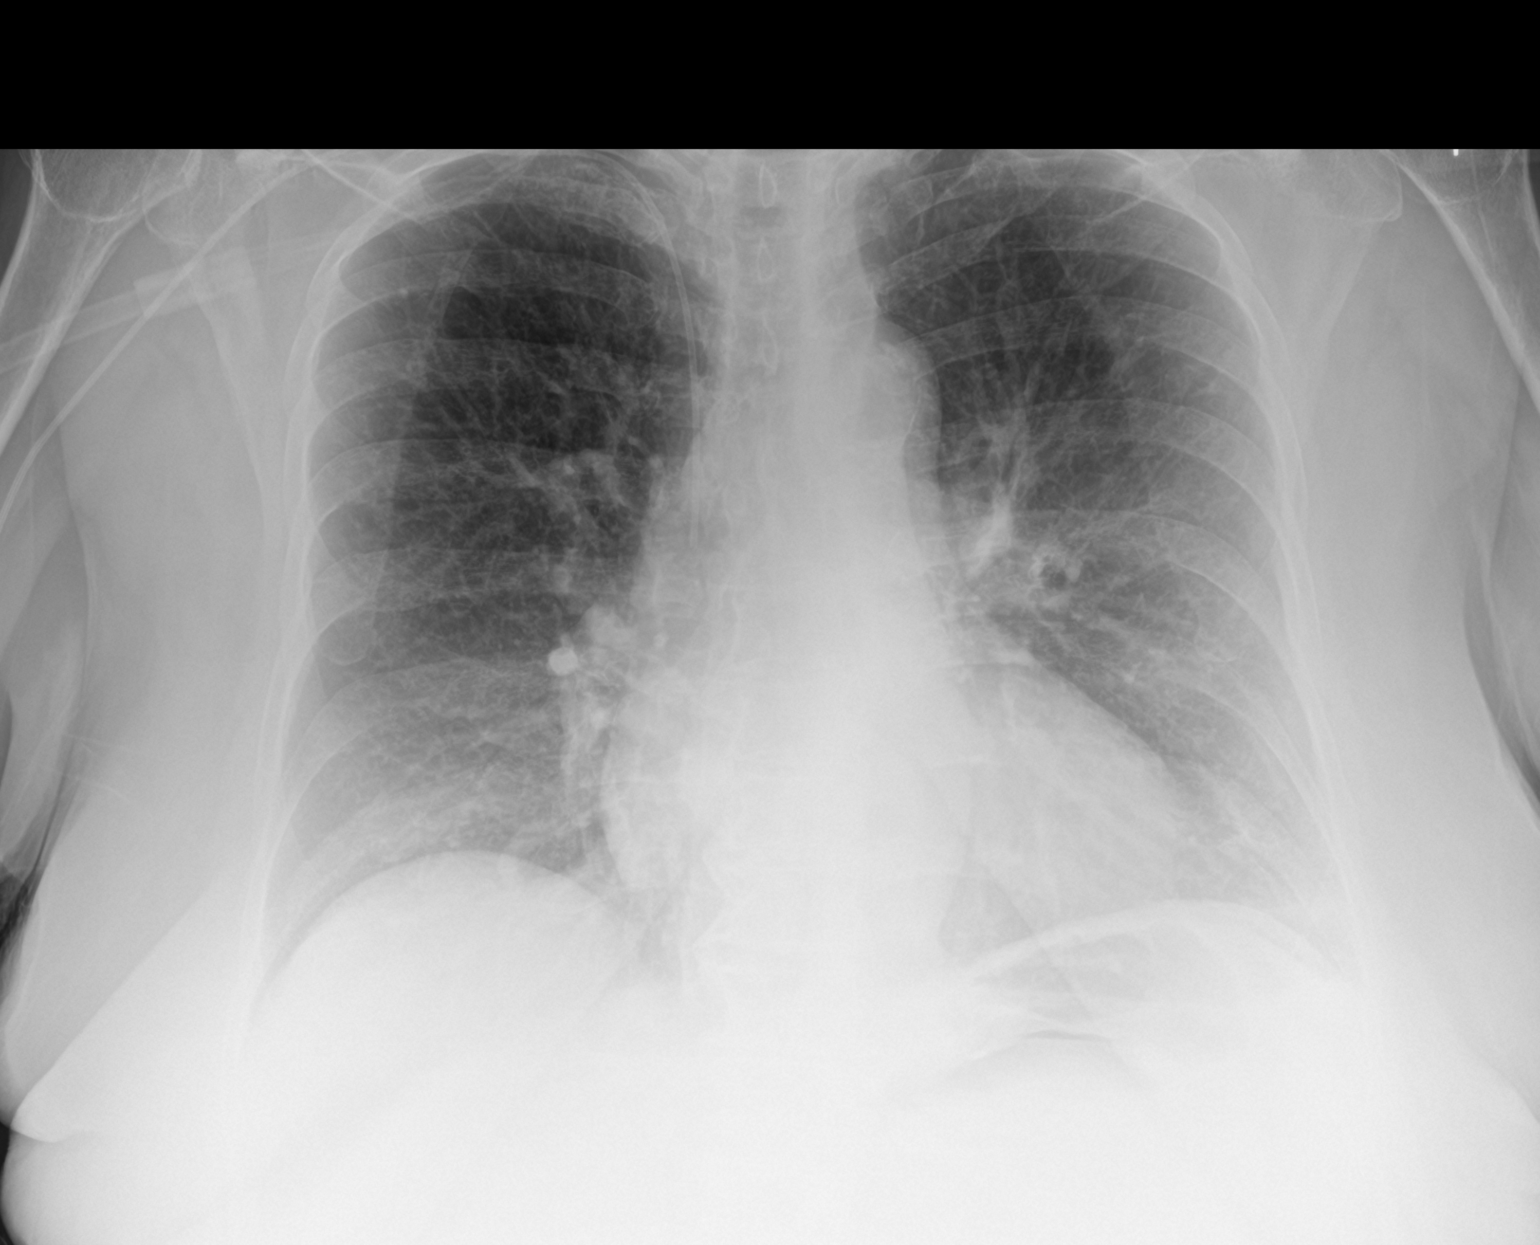

[2 of 2 positions shown; findings below may reference images not displayed]

FINDINGS: Cardiac silhouette is top-normal in size. No mediastinal or hilar
masses. No evidence of adenopathy.

Lungs are hyperexpanded but clear. No pleural effusion or
pneumothorax.

Right-sided PICC has its tip in the lower superior vena cava.

Skeletal structures are intact.
IMPRESSION: No active cardiopulmonary disease.

## 2021-04-30 ENCOUNTER — Telehealth (INDEPENDENT_AMBULATORY_CARE_PROVIDER_SITE_OTHER): Payer: Medicare Other | Admitting: Internal Medicine

## 2021-04-30 ENCOUNTER — Other Ambulatory Visit: Payer: Self-pay

## 2021-04-30 ENCOUNTER — Telehealth: Payer: Medicare Other | Admitting: Internal Medicine

## 2021-04-30 DIAGNOSIS — R29898 Other symptoms and signs involving the musculoskeletal system: Secondary | ICD-10-CM

## 2021-04-30 DIAGNOSIS — C210 Malignant neoplasm of anus, unspecified: Secondary | ICD-10-CM

## 2021-04-30 DIAGNOSIS — I2690 Septic pulmonary embolism without acute cor pulmonale: Secondary | ICD-10-CM

## 2021-04-30 DIAGNOSIS — R269 Unspecified abnormalities of gait and mobility: Secondary | ICD-10-CM

## 2021-04-30 NOTE — Progress Notes (Signed)
Virtual Visit via Video Note  I connected with Joanne Klein on 04/30/21 at  3:40 PM EDT by a video enabled telemedicine application and verified that I am speaking with the correct person using two identifiers.  The patient and the provider were at separate locations throughout the entire encounter. Patient location: home, Provider location: work   I discussed the limitations of evaluation and management by telemedicine and the availability of in person appointments. The patient expressed understanding and agreed to proceed. The patient and the provider were the only parties present for the visit unless noted in HPI below.  History of Present Illness: The patient is a 75 y.o. female with visit for mobility problems and motorized wheelchair assessment. She has had formal PT assessment which I am reviewed as part of this visit. I do agree with the assessment. She is unable to walk due to anal cancer treatment. She is unable to utilize a cane or walker to walk. She is unable to maneuver a manual wheelchair due to poor upper extremity strength and endurance. She is able to use controls for a motorized wheelchair safely. She will use this for ADLs such as feeding herself and dressing. She is currently unable to do those ADLs without assistance. She does not have any confusion.   Observations/Objective: Appearance: stable, breathing appears normal, casual grooming, abdomen does not appear distended, mobility is impaired and she is not able to lift legs against gravity, her arms are 5/5 strength but tire easily, legs 2/5 or less, memory normal not impaired, mental status is A and O times 3, able to repeat instructions and follow  Assessment and Plan: See problem oriented charting  Follow Up Instructions: needs power wheelchair and will complete paperwork to assist with this  This was a face to face encounter and I was face to face with the patient the entire visit.   I discussed the assessment and  treatment plan with the patient. The patient was provided an opportunity to ask questions and all were answered. The patient agreed with the plan and demonstrated an understanding of the instructions.   The patient was advised to call back or seek an in-person evaluation if the symptoms worsen or if the condition fails to improve as anticipated.  Hoyt Koch, MD

## 2021-04-30 NOTE — Progress Notes (Signed)
error 

## 2021-05-01 ENCOUNTER — Encounter: Payer: Self-pay | Admitting: Internal Medicine

## 2021-05-01 NOTE — Assessment & Plan Note (Signed)
Recent PE for which she is still taking xarelto has contributed to her weakened state making a manual wheelchair unable to assist her mobility. She does have good upper body strength but tires easily despite PT to help. She is at a chronic and stable state. She will be able to utilize the controls for a power wheelchair without physical or mental barriers.

## 2021-05-01 NOTE — Assessment & Plan Note (Signed)
She does require power wheelchair as a result of cancer treatment of her anal cancer she is unable to stand or walk and a cane or walker or manual wheelchair will not help remedy her mobility issues.

## 2021-05-01 NOTE — Assessment & Plan Note (Signed)
Requires power wheelchair to help her perform more ADLs independently or with less assistance. She will be able to perform dressing and eating more independently with the power wheelchair and I am recommending this.

## 2021-05-01 NOTE — Assessment & Plan Note (Signed)
She does now have stable weakness in the lower extremities which is severely limiting her ADLs. She does require power wheelchair for ADLs such as dressing and eating. Currently is needing significant assistance.

## 2021-05-01 NOTE — Telephone Encounter (Signed)
Please call back Raquel Sarna and let her know face to face assessment has been performed. Copy of signed PT assessment and face to face assessment given to [place] and then fax to Raquel Sarna so process can move forward for power wheelchair.

## 2021-05-16 ENCOUNTER — Telehealth: Payer: Self-pay | Admitting: Pharmacist

## 2021-05-16 NOTE — Chronic Care Management (AMB) (Signed)
    Chronic Care Management Pharmacy Assistant   Name: Joanne Klein  MRN: [id number] DOB: 11/26/1946   Reason for Encounter: Disease State General Call    Recent office visits:  04/30/21 Dr. Sharlet Salina video call  Recent consult visits:  None ID  Hospital visits:  None in previous 6 months  Medications: Outpatient Encounter Medications as of 05/16/2021  Medication Sig  . atorvastatin (LIPITOR) 20 MG tablet TAKE 1 TABLET BY MOUTH  EVERY OTHER DAY  . calcium carbonate (TUMS - DOSED IN MG ELEMENTAL CALCIUM) 500 MG chewable tablet Chew 1 tablet by mouth daily as needed for indigestion or heartburn.   . Cholecalciferol (VITAMIN D-3) 1000 UNITS CAPS Take 1,000 Units by mouth daily.   Regino Schultze Bandages & Supports (MEDICAL COMPRESSION STOCKINGS) MISC Use daily  . levothyroxine (SYNTHROID) 75 MCG tablet TAKE 1 TABLET BY MOUTH  DAILY BEFORE BREAKFAST  . omeprazole (PRILOSEC) 40 MG capsule TAKE 1 CAPSULE BY MOUTH  DAILY  . ondansetron (ZOFRAN) 4 MG tablet Take 1 tablet (4 mg total) by mouth every 8 (eight) hours as needed for nausea or vomiting.  Marland Kitchen oxyCODONE 10 MG TABS Take 1 tablet (10 mg total) by mouth every 4 (four) hours.  . phenylephrine-shark liver oil-mineral oil-petrolatum (PREPARATION H) 0.25-3-14-71.9 % rectal ointment Place 1 application rectally 2 (two) times daily as needed for hemorrhoids.  . Polyethylene Glycol 3350 (MIRALAX PO) Take 17 g by mouth at bedtime.   . pregabalin (LYRICA) 75 MG capsule Take 1 capsule (75 mg total) by mouth 2 (two) times daily.  . rivaroxaban (XARELTO) 20 MG TABS tablet Take 1 tablet (20 mg total) by mouth daily with supper.  . sertraline (ZOLOFT) 100 MG tablet TAKE 2 TABLETS BY MOUTH AT  BEDTIME  . vitamin B-12 (CYANOCOBALAMIN) 1000 MCG tablet Take 1,000 mcg by mouth daily.   No facility-administered encounter medications on file as of 05/16/2021.    Have you had any problems recently with your health? Patient stated that she is going well but  gets tired a lot. She states that she is going to be having surgery next month for cancer. She states that she is getting around the best she can. Patient did state that she is taking methadone for pain  Have you had any problems with your pharmacy? Patient states that she does not have any problems with getting medication from pharmacy or the cost of the medicines.   What issues or side effects are you having with your medications? Patient states that she is not having any side effects from medications  What would you like me to pass along to [facility] for them to help you with?  Patient states she would like to know when she is suppose to come off Xarelto before her surgery  What can we do to take care of you better? Patient states she will call Dr. Nathanial Millman office if any changes   Star Rating Drugs: Atorvastatin 01/28/21 90 ds  [place] Pharmacist Assistant [phone]  Time spent:20

## 2021-05-17 DIAGNOSIS — C210 Malignant neoplasm of anus, unspecified: Secondary | ICD-10-CM | POA: Diagnosis not present

## 2021-05-17 DIAGNOSIS — J449 Chronic obstructive pulmonary disease, unspecified: Secondary | ICD-10-CM | POA: Diagnosis not present

## 2021-05-22 ENCOUNTER — Ambulatory Visit: Payer: Self-pay | Admitting: General Surgery

## 2021-05-23 ENCOUNTER — Other Ambulatory Visit: Payer: Self-pay | Admitting: Internal Medicine

## 2021-05-31 NOTE — Progress Notes (Signed)
Spoke with Joanne Klein at [facility] and made aware patient is bed bound and travels by medical transport and patient must be done at [facility] due to patient is bed bound and cannot transfer self. Joanne Klein to make dr Marcello Moores aware.Spoke with patient by phone and Joanne Klein states she is aware per pcp dr crawford to stop xarelto 3 days before surgery.

## 2021-06-03 ENCOUNTER — Other Ambulatory Visit (HOSPITAL_COMMUNITY): Payer: Medicare Other

## 2021-06-03 NOTE — Progress Notes (Signed)
Spoke with wendy at [facility] and let wendy know patient is bed bound and goes by medical transport for surgeries and needs to be done at [facility], wendy to let brenda garrett know.

## 2021-06-05 ENCOUNTER — Other Ambulatory Visit: Payer: Self-pay

## 2021-06-05 ENCOUNTER — Encounter (HOSPITAL_COMMUNITY): Payer: Self-pay | Admitting: General Surgery

## 2021-06-05 NOTE — Progress Notes (Signed)
COVID Vaccine Completed:Yes Date COVID Vaccine completed: x2 Has received booster:No COVID vaccine manufacturer: Pfizer     Date of COVID positive in last 90 days: No  PCP - Hoyt Koch, MD last office note 05/01/21 in epic Cardiologist - N/A  Chest x-ray - 09/27/20 in epic EKG - 09/28/20 in epic Stress Test - N/A ECHO - 09/28/20 in epic Cardiac Cath - N/A Pacemaker/ICD device last checked:N/A  Sleep Study - Yes CPAP - No  Fasting Blood Sugar - N/A Checks Blood Sugar __N/A___ times a day  Blood Thinner Instructions: Xarelto last dose 06/03/2021 Aspirin Instructions: N/A Last Dose:N/A  Activity level:  Unable to go up a flight of stairs     activities of daily living without stopping and without symptoms    Anesthesia review: History of Pulmonary embolism, DVT, 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 Patient was also instructed that they will need to review over the PAT instructions again at home before surgery.

## 2021-06-05 NOTE — Anesthesia Preprocedure Evaluation (Addendum)
Anesthesia Evaluation  Patient identified by MRN, date of birth, ID band Patient awake    Reviewed: Allergy & Precautions, NPO status , Patient's Chart, lab work & pertinent test results  Airway Mallampati: II  TM Distance: >3 FB Neck ROM: Full    Dental no notable dental hx. (+) Caps, Missing, Poor Dentition, Dental Advisory Given,    Pulmonary sleep apnea and Continuous Positive Airway Pressure Ventilation , COPD, Patient abstained from smoking., former smoker,    Pulmonary exam normal breath sounds clear to auscultation       Cardiovascular hypertension, Pt. on medications Normal cardiovascular exam Rhythm:Regular Rate:Normal  10/21 ECHO 1. Left ventricular ejection fraction, by estimation, is 60 to 65%. The  left ventricle has normal function. The left ventricle has no regional  wall motion abnormalities. Left ventricular diastolic parameters are  consistent with Grade I diastolic  dysfunction (impaired relaxation).  2. Right ventricular systolic function is normal. The right ventricular  size is normal.  3. The mitral valve was not well visualized. No evidence of mitral valve  regurgitation.  4. The aortic valve is grossly normal. Aortic valve regurgitation is not  visualized. No aortic stenosis is present.    Neuro/Psych Anxiety    GI/Hepatic Neg liver ROS, GERD  Medicated,  Endo/Other  Hypothyroidism   Renal/GU      Musculoskeletal  (+) Arthritis , Fibromyalgia -  Abdominal (+) + obese,   Peds  Hematology Lab Results      Component                Value               Date                      WBC                      6.0                 10/10/2020                HGB                      7.9 (L)             10/10/2020                HCT                      25.2 (L)            10/10/2020                MCV                      106.8 (H)           10/10/2020                PLT                      147 (L)              10/10/2020              Anesthesia Other Findings All: prozac, codeine, morphine sulfa  Reproductive/Obstetrics  Anesthesia Physical Anesthesia Plan  ASA: 3  Anesthesia Plan: General   Post-op Pain Management:    Induction: Intravenous  PONV Risk Score and Plan: 4 or greater and Treatment may vary due to age or medical condition and Ondansetron  Airway Management Planned: LMA  Additional Equipment: None  Intra-op Plan:   Post-operative Plan:   Informed Consent: I have reviewed the patients History and Physical, chart, labs and discussed the procedure including the risks, benefits and alternatives for the proposed anesthesia with the patient or authorized representative who has indicated his/her understanding and acceptance.     Dental advisory given  Plan Discussed with: CRNA and Anesthesiologist  Anesthesia Plan Comments: (GA w LMA)      Anesthesia Quick Evaluation

## 2021-06-06 ENCOUNTER — Encounter (HOSPITAL_COMMUNITY): Payer: Self-pay | Admitting: General Surgery

## 2021-06-06 ENCOUNTER — Ambulatory Visit (HOSPITAL_COMMUNITY): Payer: Medicare Other | Admitting: Certified Registered Nurse Anesthetist

## 2021-06-06 ENCOUNTER — Ambulatory Visit (HOSPITAL_COMMUNITY)
Admission: RE | Admit: 2021-06-06 | Discharge: 2021-06-06 | Disposition: A | Discharge status: Home / Self Care | Payer: Medicare Other | Attending: General Surgery | Admitting: General Surgery

## 2021-06-06 ENCOUNTER — Encounter (HOSPITAL_COMMUNITY)
Admission: RE | Disposition: A | Discharge status: Home / Self Care | Payer: Self-pay | Source: Home / Self Care | Attending: General Surgery

## 2021-06-06 DIAGNOSIS — E7800 Pure hypercholesterolemia, unspecified: Secondary | ICD-10-CM | POA: Diagnosis not present

## 2021-06-06 DIAGNOSIS — K6289 Other specified diseases of anus and rectum: Secondary | ICD-10-CM | POA: Diagnosis not present

## 2021-06-06 DIAGNOSIS — Z9221 Personal history of antineoplastic chemotherapy: Secondary | ICD-10-CM | POA: Diagnosis not present

## 2021-06-06 DIAGNOSIS — J449 Chronic obstructive pulmonary disease, unspecified: Secondary | ICD-10-CM | POA: Insufficient documentation

## 2021-06-06 DIAGNOSIS — Z885 Allergy status to narcotic agent status: Secondary | ICD-10-CM | POA: Insufficient documentation

## 2021-06-06 DIAGNOSIS — K625 Hemorrhage of anus and rectum: Secondary | ICD-10-CM | POA: Diagnosis present

## 2021-06-06 DIAGNOSIS — Z882 Allergy status to sulfonamides status: Secondary | ICD-10-CM | POA: Insufficient documentation

## 2021-06-06 DIAGNOSIS — Z923 Personal history of irradiation: Secondary | ICD-10-CM | POA: Diagnosis not present

## 2021-06-06 DIAGNOSIS — C210 Malignant neoplasm of anus, unspecified: Secondary | ICD-10-CM | POA: Diagnosis not present

## 2021-06-06 DIAGNOSIS — I10 Essential (primary) hypertension: Secondary | ICD-10-CM | POA: Diagnosis not present

## 2021-06-06 DIAGNOSIS — C211 Malignant neoplasm of anal canal: Secondary | ICD-10-CM | POA: Insufficient documentation

## 2021-06-06 DIAGNOSIS — Z85048 Personal history of other malignant neoplasm of rectum, rectosigmoid junction, and anus: Secondary | ICD-10-CM | POA: Diagnosis not present

## 2021-06-06 DIAGNOSIS — F17290 Nicotine dependence, other tobacco product, uncomplicated: Secondary | ICD-10-CM | POA: Insufficient documentation

## 2021-06-06 DIAGNOSIS — Z79899 Other long term (current) drug therapy: Secondary | ICD-10-CM | POA: Diagnosis not present

## 2021-06-06 HISTORY — DX: Anemia, unspecified: D64.9

## 2021-06-06 HISTORY — PX: EVALUATION UNDER ANESTHESIA WITH ANAL FISSUROTOMY: SHX5622

## 2021-06-06 HISTORY — DX: Difficulty in walking, not elsewhere classified: R26.2

## 2021-06-06 HISTORY — DX: Pneumonia, unspecified organism: J18.9

## 2021-06-06 HISTORY — DX: Acute embolism and thrombosis of unspecified deep veins of unspecified lower extremity: I82.409

## 2021-06-06 HISTORY — DX: Other injury of unspecified body region, initial encounter: T14.8XXA

## 2021-06-06 HISTORY — DX: Unspecified dementia, unspecified severity, without behavioral disturbance, psychotic disturbance, mood disturbance, and anxiety: F03.90

## 2021-06-06 LAB — CBC
HCT: 42.1 % (ref 36.0–46.0)
Hemoglobin: 14.1 g/dL (ref 12.0–15.0)
MCH: 31.3 pg (ref 26.0–34.0)
MCHC: 33.5 g/dL (ref 30.0–36.0)
MCV: 93.6 fL (ref 80.0–100.0)
Platelets: 129 10*3/uL — ABNORMAL LOW (ref 150–400)
RBC: 4.5 MIL/uL (ref 3.87–5.11)
RDW: 13.2 % (ref 11.5–15.5)
WBC: 7.1 10*3/uL (ref 4.0–10.5)
nRBC: 0 % (ref 0.0–0.2)

## 2021-06-06 LAB — BASIC METABOLIC PANEL
Anion gap: 11 (ref 5–15)
BUN: 20 mg/dL (ref 8–23)
CO2: 28 mmol/L (ref 22–32)
Calcium: 9.2 mg/dL (ref 8.9–10.3)
Chloride: 99 mmol/L (ref 98–111)
Creatinine, Ser: 0.78 mg/dL (ref 0.44–1.00)
GFR, Estimated: >60 mL/min (ref >60)
Glucose, Bld: 102 mg/dL — ABNORMAL HIGH (ref 70–99)
Potassium: 3.3 mmol/L — ABNORMAL LOW (ref 3.5–5.1)
Sodium: 138 mmol/L (ref 135–145)

## 2021-06-06 SURGERY — EXAM UNDER ANESTHESIA WITH ANAL FISSUROTOMY
Anesthesia: Monitor Anesthesia Care

## 2021-06-06 SURGERY — EXAM UNDER ANESTHESIA WITH ANAL FISSUROTOMY
Anesthesia: General

## 2021-06-06 MED ORDER — BUPIVACAINE-EPINEPHRINE 0.25% -1:200000 IJ SOLN
INTRAMUSCULAR | Status: DC | PRN
  Administered 2021-06-06: 7 mL

## 2021-06-06 MED ORDER — CHLORHEXIDINE GLUCONATE 0.12 % MT SOLN
15.0000 mL | Freq: Once | OROMUCOSAL | Status: AC
  Administered 2021-06-06: 12:00:00 15 mL via OROMUCOSAL

## 2021-06-06 MED ORDER — LIDOCAINE 2% (20 MG/ML) 5 ML SYRINGE
INTRAMUSCULAR | Status: DC | PRN
  Administered 2021-06-06: 100 mg via INTRAVENOUS

## 2021-06-06 MED ORDER — ONDANSETRON HCL 4 MG/2ML IJ SOLN: 4.0000 mg | Freq: Once | INTRAMUSCULAR | Status: DC | PRN

## 2021-06-06 MED ORDER — PROPOFOL 10 MG/ML IV BOLUS
INTRAVENOUS | Status: DC | PRN
  Administered 2021-06-06: 150 mg via INTRAVENOUS

## 2021-06-06 MED ORDER — PROPOFOL 10 MG/ML IV BOLUS
INTRAVENOUS | Status: AC
  Filled 2021-06-06: qty 20

## 2021-06-06 MED ORDER — ACETAMINOPHEN 650 MG RE SUPP
650.0000 mg | RECTAL | Status: DC | PRN
  Filled 2021-06-06: qty 1

## 2021-06-06 MED ORDER — OXYCODONE HCL 5 MG PO TABS: 5.0000 mg | ORAL_TABLET | ORAL | Status: DC | PRN

## 2021-06-06 MED ORDER — ACETAMINOPHEN 325 MG PO TABS: 650.0000 mg | ORAL_TABLET | ORAL | Status: DC | PRN

## 2021-06-06 MED ORDER — ATORVASTATIN CALCIUM 20 MG PO TABS: 20.0000 mg | ORAL_TABLET | ORAL | Status: AC

## 2021-06-06 MED ORDER — BUPIVACAINE-EPINEPHRINE (PF) 0.25% -1:200000 IJ SOLN
INTRAMUSCULAR | Status: AC
  Filled 2021-06-06: qty 30

## 2021-06-06 MED ORDER — FENTANYL CITRATE (PF) 100 MCG/2ML IJ SOLN
INTRAMUSCULAR | Status: AC
  Filled 2021-06-06: qty 2

## 2021-06-06 MED ORDER — FENTANYL CITRATE (PF) 100 MCG/2ML IJ SOLN
INTRAMUSCULAR | Status: DC | PRN
  Administered 2021-06-06: 50 ug via INTRAVENOUS
  Administered 2021-06-06 (×2): 25 ug via INTRAVENOUS

## 2021-06-06 MED ORDER — LACTATED RINGERS IV SOLN: INTRAVENOUS | Status: DC

## 2021-06-06 MED ORDER — ACETAMINOPHEN 10 MG/ML IV SOLN: 1000.0000 mg | Freq: Once | INTRAVENOUS | Status: DC | PRN

## 2021-06-06 MED ORDER — SODIUM CHLORIDE 0.9 % IV SOLN: 250.0000 mL | INTRAVENOUS | Status: DC | PRN

## 2021-06-06 MED ORDER — ONDANSETRON HCL 4 MG/2ML IJ SOLN
INTRAMUSCULAR | Status: DC | PRN
  Administered 2021-06-06: 4 mg via INTRAVENOUS

## 2021-06-06 MED ORDER — ORAL CARE MOUTH RINSE: 15.0000 mL | Freq: Once | OROMUCOSAL | Status: AC

## 2021-06-06 MED ORDER — FENTANYL CITRATE (PF) 100 MCG/2ML IJ SOLN: 25.0000 ug | INTRAMUSCULAR | Status: DC | PRN

## 2021-06-06 MED ORDER — SODIUM CHLORIDE 0.9% FLUSH: 3.0000 mL | INTRAVENOUS | Status: DC | PRN

## 2021-06-06 MED ORDER — SODIUM CHLORIDE 0.9% FLUSH: 3.0000 mL | Freq: Two times a day (BID) | INTRAVENOUS | Status: DC

## 2021-06-06 SURGICAL SUPPLY — 40 items
APL SKNCLS STERI-STRIP NONHPOA (GAUZE/BANDAGES/DRESSINGS) ×1
BENZOIN TINCTURE PRP APPL 2/3 (GAUZE/BANDAGES/DRESSINGS) ×3 IMPLANT
BLADE SURG 15 STRL LF DISP TIS (BLADE) IMPLANT
BLADE SURG 15 STRL SS (BLADE)
BLADE SURG SZ10 CARB STEEL (BLADE) ×1 IMPLANT
BRIEF STRETCH FOR OB PAD LRG (UNDERPADS AND DIAPERS) ×6 IMPLANT
COVER SURGICAL LIGHT HANDLE (MISCELLANEOUS) ×3 IMPLANT
COVER WAND RF STERILE (DRAPES) IMPLANT
DECANTER SPIKE VIAL GLASS SM (MISCELLANEOUS) ×3 IMPLANT
DRAPE UTILITY XL STRL (DRAPES) ×3 IMPLANT
DRSG PAD ABDOMINAL 8X10 ST (GAUZE/BANDAGES/DRESSINGS) ×2 IMPLANT
DRSG VASELINE 3X18 (GAUZE/BANDAGES/DRESSINGS) IMPLANT
ELECT REM PT RETURN 15FT ADLT (MISCELLANEOUS) ×3 IMPLANT
GAUZE 4X4 16PLY RFD (DISPOSABLE) IMPLANT
GAUZE SPONGE 4X4 12PLY STRL (GAUZE/BANDAGES/DRESSINGS) IMPLANT
GLOVE SURG ENC MOIS LTX SZ6.5 (GLOVE) ×3 IMPLANT
GLOVE SURG UNDER POLY LF SZ7 (GLOVE) ×3 IMPLANT
GOWN STRL REUS W/TWL XL LVL3 (GOWN DISPOSABLE) ×4 IMPLANT
KIT BASIN OR (CUSTOM PROCEDURE TRAY) ×3 IMPLANT
KIT TURNOVER KIT A (KITS) ×3 IMPLANT
LEGGING LITHOTOMY PAIR STRL (DRAPES) IMPLANT
LOOP VESSEL MAXI BLUE (MISCELLANEOUS) IMPLANT
NDL HYPO 25X1 1.5 SAFETY (NEEDLE) ×1 IMPLANT
NDL SAFETY ECLIPSE 18X1.5 (NEEDLE) IMPLANT
NEEDLE HYPO 18GX1.5 SHARP (NEEDLE)
NEEDLE HYPO 25X1 1.5 SAFETY (NEEDLE) ×3 IMPLANT
PACK LITHOTOMY IV (CUSTOM PROCEDURE TRAY) ×3 IMPLANT
PENCIL SMOKE EVACUATOR (MISCELLANEOUS) ×2 IMPLANT
SPONGE GAUZE 4X4 12PLY (GAUZE/BANDAGES/DRESSINGS) ×2 IMPLANT
SPONGE SURGIFOAM ABS GEL 12-7 (HEMOSTASIS) IMPLANT
SUT CHROMIC 2 0 SH (SUTURE) IMPLANT
SUT ETHIBOND 0 (SUTURE) IMPLANT
SUT GUT CHROMIC 3 0 (SUTURE) IMPLANT
SUT MON AB 3-0 SH 27 (SUTURE) ×3
SUT MON AB 3-0 SH27 (SUTURE) ×1 IMPLANT
SUT VIC AB 4-0 P-3 18XBRD (SUTURE) IMPLANT
SUT VIC AB 4-0 P3 18 (SUTURE)
SYR CONTROL 10ML LL (SYRINGE) ×3 IMPLANT
TOWEL OR 17X26 10 PK STRL BLUE (TOWEL DISPOSABLE) ×3 IMPLANT
TOWEL OR NON WOVEN STRL DISP B (DISPOSABLE) ×3 IMPLANT

## 2021-06-06 NOTE — Anesthesia Procedure Notes (Signed)
Procedure Name: LMA Insertion Date/Time: 06/06/2021 12:09 PM Performed by: West Pugh, CRNA Pre-anesthesia Checklist: Patient identified, Emergency Drugs available, Suction available, Patient being monitored and Timeout performed Patient Re-evaluated:Patient Re-evaluated prior to induction Oxygen Delivery Method: Circle system utilized Preoxygenation: Pre-oxygenation with 100% oxygen Induction Type: IV induction Ventilation: Mask ventilation without difficulty LMA: LMA with gastric port inserted LMA Size: 4.0 Number of attempts: 1 Placement Confirmation: positive ETCO2 Tube secured with: Tape Dental Injury: Teeth and Oropharynx as per pre-operative assessment

## 2021-06-06 NOTE — Discharge Instructions (Addendum)
Beginning the day after surgery:  You may sit in a tub of warm water 2-3 times a day to relieve discomfort.  Eat a regular diet high in fiber.  Avoid foods that give you constipation or diarrhea.  Avoid foods that are difficult to digest, such as seeds, nuts, corn or popcorn.  Do not go any longer than 2 days without a bowel movement.  You may take a dose of Milk of Magnesia if you become constipated.    Drink 6-8 glasses of water daily.  Walking is encouraged.  Avoid strenuous activity and heavy lifting for one month after surgery.    Call the office if you have any questions or concerns.  Call immediately if you develop:  Excessive rectal bleeding (more than a cup or passing large clots) Increased discomfort Fever greater than 100 F Difficulty urinating    RESTART ELIQUIS TODAY (June 06, 2021)

## 2021-06-06 NOTE — Op Note (Signed)
06/06/2021  12:25 PM  PATIENT:  Joanne Klein  75 y.o. female  Patient Care Team: Hoyt Koch, MD as PCP - General (Internal Medicine) Inda Castle, MD (Inactive) (Gastroenterology) Nicholaus Bloom, MD (Anesthesiology) Virgina Evener, Dawn, RN (Inactive) as Oncology Nurse Navigator Charlton Haws, [facility] as Pharmacist (Pharmacist)  PRE-OPERATIVE DIAGNOSIS:  RECTAL BLEEDING  POST-OPERATIVE DIAGNOSIS:  ANAL MASS  PROCEDURE:  ANAL EXAM UNDER ANESTHESIA WITH BIOPSY  Surgeon(s): Leighton Ruff, MD  ASSISTANT: none   ANESTHESIA:   local and MAC  SPECIMEN:  Source of Specimen:  anal canal mass  DISPOSITION OF SPECIMEN:  PATHOLOGY  COUNTS:  YES  PLAN OF CARE: Discharge to home after PACU  PATIENT DISPOSITION:  PACU - hemodynamically stable.  INDICATION: 75 y.o. F with multiple medical problems lost to follow.  She presented to the office with rectal bleeding and was unable to get on the exam table due to lower extremity weakness, so an exam under anesthesia was recommended   OR FINDINGS: L lateral anal canal mass  DESCRIPTION: the patient was identified in the preoperative holding area and taken to the OR where they were laid on the operating room table.  MAC anesthesia was induced without difficulty. The patient was then positioned in prone jackknife position with buttocks gently taped apart.  The patient was then prepped and draped in usual sterile fashion.  SCDs were noted to be in place prior to the initiation of anesthesia. A surgical timeout was performed indicating the correct patient, procedure, positioning and need for preoperative antibiotics.  A rectal block was performed using Marcaine with epinephrine.    I began with a digital rectal exam.  There was a clear ulcerated mass involving the left side anal canal and perianal skin.  I then placed a Hill-Ferguson anoscope into the anal canal and evaluated this completely.  There was no sign of obstruction.  A  biopsy was taken using a 15 blade scalpel.  Hemostasis was achieved using electrocautery.  Biopsy was sent to pathology for further examination.  Patient was then awakened from anesthesia and sent to the postanesthesia care unit in stable condition.  All counts were correct per operating room staff.

## 2021-06-06 NOTE — Anesthesia Postprocedure Evaluation (Signed)
Anesthesia Post Note  Patient: Joanne Klein  Procedure(s) Performed: ANAL EXAM UNDER ANESTHESIA WITH BIOPSY     Patient location during evaluation: PACU Anesthesia Type: General Level of consciousness: awake and alert Pain management: pain level controlled Vital Signs Assessment: post-procedure vital signs reviewed and stable Respiratory status: spontaneous breathing, nonlabored ventilation, respiratory function stable and patient connected to nasal cannula oxygen Cardiovascular status: blood pressure returned to baseline and stable Postop Assessment: no apparent nausea or vomiting Anesthetic complications: no   No notable events documented.  Last Vitals:  Vitals:   06/06/21 1126 06/06/21 1243  BP: (!) 187/96 (!) 183/92  Pulse: 86 78  Resp: 16 13  Temp: 37.3 C 36.6 C  SpO2: 92% 100%    Last Pain:  Vitals:   06/06/21 1243  TempSrc:   PainSc: [place]

## 2021-06-06 NOTE — Transfer of Care (Signed)
Immediate Anesthesia Transfer of Care Note  Patient: Joanne Klein  Procedure(s) Performed: ANAL EXAM UNDER ANESTHESIA WITH BIOPSY  Patient Location: PACU  Anesthesia Type:General  Level of Consciousness: awake, drowsy and patient cooperative  Airway & Oxygen Therapy: Patient Spontanous Breathing and Patient connected to face mask oxygen  Post-op Assessment: Report given to RN and Post -op Vital signs reviewed and stable  Post vital signs: Reviewed and stable  Last Vitals:  Vitals Value Taken Time  BP 183/92 06/06/21 1240  Temp    Pulse 75 06/06/21 1243  Resp 9 06/06/21 1243  SpO2 100 % 06/06/21 1243  Vitals shown include unvalidated device data.  Last Pain:  Vitals:   06/06/21 1141  TempSrc:   PainSc: 5          Complications: No notable events documented.

## 2021-06-06 NOTE — H&P (Signed)
The patient is a 75 year old female who presents with anal pain. 75 year old female who presented to the office with complaints of anal pain and bleeding.  She states that she noticed a mass in the area, which had been slowly enlarging.    She saw her gastroenterologist and it was felt that the mass may represent a malignancy and therefore she was referred to me.  She has a history of tobacco abuse and still uses a vape pen.  She does have COPD and arthritis. She was diagnosed with anal squamous cell carcinoma.  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  She underwent chemotherapy and radiation for this.  She has had significant trouble with the side effects, but this is slowly improving.  Her final treatment was November 24, 2019.  She has been somewhat lost to follow-up due to difficulty with lower extremity paralysis.  She is being seen by a neurologist for this.  She initially presented to me earlier this year for evaluation for muscle biopsy.  She states that she does not want this done now     Problem List/Past Medical  PERIANAL MASS (K62.89)  HISTORY OF ANAL CANCER (Z85.048)  WEAKNESS OF BOTH LOWER EXTREMITIES (R29.898)    Past Surgical History  Appendectomy  Hysterectomy (due to cancer) - Partial  Oral Surgery  Tonsillectomy    Diagnostic Studies History  Colonoscopy  1-5 years ago   Allergies  CODEINE  MORPHINE  PROzac *ANTIDEPRESSANTS*  Sulfa Antibiotics  Allergies Reconciled    Current Meds  Medication Sig   atorvastatin (LIPITOR) 20 MG tablet TAKE 1 TABLET BY MOUTH  EVERY OTHER DAY (Patient taking differently: Take 20 mg by mouth 4 (four) times a week. Take 1 tablet (20 mg) by mouth in the evening Saturdays, Sundays, Tuesdays & Thursdays.)   calcium carbonate (TUMS - DOSED IN MG ELEMENTAL CALCIUM) 500 MG chewable tablet Chew 1 tablet by mouth daily as needed for indigestion or heartburn.     Cholecalciferol (VITAMIN D-3) 1000 UNITS CAPS Take 1,000 Units by mouth at bedtime.   levothyroxine (SYNTHROID) 75 MCG tablet TAKE 1 TABLET BY MOUTH  DAILY BEFORE BREAKFAST (Patient taking differently: Take 75 mcg by mouth daily before breakfast.)   methadone (DOLOPHINE) 5 MG tablet Take 5 mg by mouth every 12 (twelve) hours.   omeprazole (PRILOSEC) 40 MG capsule TAKE 1 CAPSULE BY MOUTH  DAILY (Patient taking differently: Take 40 mg by mouth in the morning.)   ondansetron (ZOFRAN) 4 MG tablet TAKE 1 TABLET(4 MG) BY MOUTH EVERY 8 HOURS AS NEEDED FOR NAUSEA OR VOMITING (Patient taking differently: Take 4 mg by mouth every 8 (eight) hours as needed for nausea or vomiting.)   oxyCODONE-acetaminophen (PERCOCET) 10-325 MG tablet Take 1 tablet by mouth every 4 (four) hours as needed for pain.   phenylephrine-shark liver oil-mineral oil-petrolatum (PREPARATION H) 0.25-3-14-71.9 % rectal ointment Place 1 application rectally 2 (two) times daily as needed for hemorrhoids.   Polyethylene Glycol 3350 (MIRALAX PO) Take 17 g by mouth daily as needed (constipation).   sertraline (ZOLOFT) 100 MG tablet TAKE 2 TABLETS BY MOUTH AT  BEDTIME (Patient taking differently: Take 200 mg by mouth at bedtime.)   vitamin B-12 (CYANOCOBALAMIN) 1000 MCG tablet Take 1,000 mcg by mouth in the morning.      Social History  Caffeine use  Carbonated beverages. Illicit drug use  Remotely quit drug use.   Family  History  Alcohol Abuse  Brother, Father, Mother. Arthritis  Sister.   Pregnancy / Birth History  Age of menopause  <47 Contraceptive History  Intrauterine device, Oral contraceptives. Gravida  3 Maternal age  16-20   Other Problems  Anxiety Disorder  Arthritis  Back Pain  Bladder Problems  Depression  Gastroesophageal Reflux Disease  Hemorrhoids  Lump In Breast          Review of Systems  General Not Present- Anorexia, Appetite Loss, Chills and Fever. Respiratory Not Present- Bloody sputum and Difficulty  Breathing. Cardiovascular Not Present- Chest Pain. Gastrointestinal Present- Bloody Stool. Not Present- Abdominal Pain. Musculoskeletal Present- Leg Weakness. Neurological Present- Decreased Memory. Not Present- Fainting, Headaches, Numbness, Seizures, Tingling, Tremor, Trouble walking and Weakness.   Ht 5\' 6"  (1.676 m)   Wt 113.4 kg   LMP  (LMP Unknown)   BMI 40.35 kg/m         Physical Exam    General Mental Status - Alert. General Appearance - Cooperative.  CV: RRR Lungs: CTA Abdomen Palpation/Percussion Palpation and Percussion of the abdomen reveal - Soft.       Assessment & Plan    HISTORY OF ANAL CANCER (Z85.048) Impression: Patient is having rectal bleeding and is due for anoscopy and surveillance. Given her muscle weakness, she is unable to get on the exam table today. We decided to perform an anal exam under anesthesia with possible biopsy in the operating room while getting her muscle biopsy.

## 2021-06-07 ENCOUNTER — Encounter (HOSPITAL_COMMUNITY): Payer: Self-pay | Admitting: General Surgery

## 2021-06-07 LAB — SURGICAL PATHOLOGY

## 2021-06-11 DIAGNOSIS — Z79891 Long term (current) use of opiate analgesic: Secondary | ICD-10-CM | POA: Diagnosis not present

## 2021-06-11 DIAGNOSIS — G894 Chronic pain syndrome: Secondary | ICD-10-CM | POA: Diagnosis not present

## 2021-06-11 DIAGNOSIS — M797 Fibromyalgia: Secondary | ICD-10-CM | POA: Diagnosis not present

## 2021-06-11 DIAGNOSIS — G603 Idiopathic progressive neuropathy: Secondary | ICD-10-CM | POA: Diagnosis not present

## 2021-06-13 ENCOUNTER — Telehealth: Payer: Self-pay

## 2021-06-13 NOTE — Telephone Encounter (Signed)
TC from [place] with [phone] stating Pt was recommended to hospice and wanted to know if Dr. Benay Spice would be the attending. Per Dr Benay Spice he will be the attending. And relayed to Nurse Dr. Benay Spice would like Pt to schedule an appointment for her to see him. Nurse verbalized understanding and will relay message to Pt and Family when she goes out to visit.

## 2021-06-17 DIAGNOSIS — C210 Malignant neoplasm of anus, unspecified: Secondary | ICD-10-CM | POA: Diagnosis not present

## 2021-06-17 DIAGNOSIS — J449 Chronic obstructive pulmonary disease, unspecified: Secondary | ICD-10-CM | POA: Diagnosis not present

## 2021-06-18 ENCOUNTER — Emergency Department (HOSPITAL_COMMUNITY)

## 2021-06-18 ENCOUNTER — Inpatient Hospital Stay (HOSPITAL_COMMUNITY)
Admission: EM | Admit: 2021-06-18 | Discharge: 2021-06-25 | DRG: 871 | Disposition: A | Discharge status: Hospice | Attending: Internal Medicine | Admitting: Internal Medicine

## 2021-06-18 ENCOUNTER — Other Ambulatory Visit: Payer: Self-pay

## 2021-06-18 DIAGNOSIS — J189 Pneumonia, unspecified organism: Secondary | ICD-10-CM | POA: Diagnosis not present

## 2021-06-18 DIAGNOSIS — R410 Disorientation, unspecified: Secondary | ICD-10-CM | POA: Diagnosis not present

## 2021-06-18 DIAGNOSIS — Z86711 Personal history of pulmonary embolism: Secondary | ICD-10-CM

## 2021-06-18 DIAGNOSIS — Z66 Do not resuscitate: Secondary | ICD-10-CM | POA: Diagnosis present

## 2021-06-18 DIAGNOSIS — M797 Fibromyalgia: Secondary | ICD-10-CM | POA: Diagnosis present

## 2021-06-18 DIAGNOSIS — D6959 Other secondary thrombocytopenia: Secondary | ICD-10-CM | POA: Diagnosis present

## 2021-06-18 DIAGNOSIS — R6889 Other general symptoms and signs: Secondary | ICD-10-CM | POA: Diagnosis not present

## 2021-06-18 DIAGNOSIS — Z515 Encounter for palliative care: Secondary | ICD-10-CM

## 2021-06-18 DIAGNOSIS — A419 Sepsis, unspecified organism: Principal | ICD-10-CM | POA: Diagnosis present

## 2021-06-18 DIAGNOSIS — E872 Acidosis: Secondary | ICD-10-CM | POA: Diagnosis present

## 2021-06-18 DIAGNOSIS — R778 Other specified abnormalities of plasma proteins: Secondary | ICD-10-CM

## 2021-06-18 DIAGNOSIS — C44520 Squamous cell carcinoma of anal skin: Secondary | ICD-10-CM | POA: Diagnosis present

## 2021-06-18 DIAGNOSIS — Z7901 Long term (current) use of anticoagulants: Secondary | ICD-10-CM

## 2021-06-18 DIAGNOSIS — I499 Cardiac arrhythmia, unspecified: Secondary | ICD-10-CM | POA: Diagnosis not present

## 2021-06-18 DIAGNOSIS — K219 Gastro-esophageal reflux disease without esophagitis: Secondary | ICD-10-CM | POA: Diagnosis present

## 2021-06-18 DIAGNOSIS — J9601 Acute respiratory failure with hypoxia: Secondary | ICD-10-CM

## 2021-06-18 DIAGNOSIS — Z743 Need for continuous supervision: Secondary | ICD-10-CM | POA: Diagnosis not present

## 2021-06-18 DIAGNOSIS — E039 Hypothyroidism, unspecified: Secondary | ICD-10-CM | POA: Diagnosis present

## 2021-06-18 DIAGNOSIS — Z7401 Bed confinement status: Secondary | ICD-10-CM

## 2021-06-18 DIAGNOSIS — F429 Obsessive-compulsive disorder, unspecified: Secondary | ICD-10-CM | POA: Diagnosis present

## 2021-06-18 DIAGNOSIS — Z79899 Other long term (current) drug therapy: Secondary | ICD-10-CM

## 2021-06-18 DIAGNOSIS — I10 Essential (primary) hypertension: Secondary | ICD-10-CM | POA: Diagnosis present

## 2021-06-18 DIAGNOSIS — E7800 Pure hypercholesterolemia, unspecified: Secondary | ICD-10-CM | POA: Diagnosis present

## 2021-06-18 DIAGNOSIS — Z833 Family history of diabetes mellitus: Secondary | ICD-10-CM

## 2021-06-18 DIAGNOSIS — R404 Transient alteration of awareness: Secondary | ICD-10-CM | POA: Diagnosis not present

## 2021-06-18 DIAGNOSIS — R6520 Severe sepsis without septic shock: Secondary | ICD-10-CM | POA: Diagnosis not present

## 2021-06-18 DIAGNOSIS — J439 Emphysema, unspecified: Secondary | ICD-10-CM | POA: Diagnosis present

## 2021-06-18 DIAGNOSIS — Z20822 Contact with and (suspected) exposure to covid-19: Secondary | ICD-10-CM | POA: Diagnosis present

## 2021-06-18 DIAGNOSIS — R7989 Other specified abnormal findings of blood chemistry: Secondary | ICD-10-CM

## 2021-06-18 DIAGNOSIS — F32A Depression, unspecified: Secondary | ICD-10-CM | POA: Diagnosis present

## 2021-06-18 DIAGNOSIS — Z800 Family history of malignant neoplasm of digestive organs: Secondary | ICD-10-CM

## 2021-06-18 DIAGNOSIS — Z79890 Hormone replacement therapy (postmenopausal): Secondary | ICD-10-CM

## 2021-06-18 DIAGNOSIS — Z87891 Personal history of nicotine dependence: Secondary | ICD-10-CM

## 2021-06-18 DIAGNOSIS — R0600 Dyspnea, unspecified: Secondary | ICD-10-CM | POA: Diagnosis not present

## 2021-06-18 DIAGNOSIS — Z86718 Personal history of other venous thrombosis and embolism: Secondary | ICD-10-CM

## 2021-06-18 DIAGNOSIS — J90 Pleural effusion, not elsewhere classified: Secondary | ICD-10-CM | POA: Diagnosis not present

## 2021-06-18 DIAGNOSIS — E538 Deficiency of other specified B group vitamins: Secondary | ICD-10-CM | POA: Diagnosis present

## 2021-06-18 DIAGNOSIS — N179 Acute kidney failure, unspecified: Secondary | ICD-10-CM | POA: Diagnosis present

## 2021-06-18 DIAGNOSIS — F0390 Unspecified dementia without behavioral disturbance: Secondary | ICD-10-CM | POA: Diagnosis present

## 2021-06-18 DIAGNOSIS — Z8542 Personal history of malignant neoplasm of other parts of uterus: Secondary | ICD-10-CM

## 2021-06-18 DIAGNOSIS — E041 Nontoxic single thyroid nodule: Secondary | ICD-10-CM | POA: Diagnosis present

## 2021-06-18 DIAGNOSIS — Z90710 Acquired absence of both cervix and uterus: Secondary | ICD-10-CM

## 2021-06-18 DIAGNOSIS — N390 Urinary tract infection, site not specified: Secondary | ICD-10-CM

## 2021-06-18 DIAGNOSIS — Z85048 Personal history of other malignant neoplasm of rectum, rectosigmoid junction, and anus: Secondary | ICD-10-CM

## 2021-06-18 DIAGNOSIS — Z79891 Long term (current) use of opiate analgesic: Secondary | ICD-10-CM

## 2021-06-18 DIAGNOSIS — Z923 Personal history of irradiation: Secondary | ICD-10-CM

## 2021-06-18 DIAGNOSIS — G9341 Metabolic encephalopathy: Secondary | ICD-10-CM | POA: Diagnosis present

## 2021-06-18 DIAGNOSIS — Z9221 Personal history of antineoplastic chemotherapy: Secondary | ICD-10-CM

## 2021-06-18 DIAGNOSIS — G8929 Other chronic pain: Secondary | ICD-10-CM | POA: Diagnosis present

## 2021-06-18 LAB — COMPREHENSIVE METABOLIC PANEL
ALT: 18 U/L (ref 0–44)
AST: 23 U/L (ref 15–41)
Albumin: 2.9 g/dL — ABNORMAL LOW (ref 3.5–5.0)
Alkaline Phosphatase: 84 U/L (ref 38–126)
Anion gap: 12 (ref 5–15)
BUN: 19 mg/dL (ref 8–23)
CO2: 24 mmol/L (ref 22–32)
Calcium: 9.1 mg/dL (ref 8.9–10.3)
Chloride: 99 mmol/L (ref 98–111)
Creatinine, Ser: 1.17 mg/dL — ABNORMAL HIGH (ref 0.44–1.00)
GFR, Estimated: 49 mL/min — ABNORMAL LOW (ref >60)
Glucose, Bld: 120 mg/dL — ABNORMAL HIGH (ref 70–99)
Potassium: 3.7 mmol/L (ref 3.5–5.1)
Sodium: 135 mmol/L (ref 135–145)
Total Bilirubin: 0.9 mg/dL (ref 0.3–1.2)
Total Protein: 6.5 g/dL (ref 6.5–8.1)

## 2021-06-18 LAB — I-STAT ARTERIAL BLOOD GAS, ED
Acid-Base Excess: 2 mmol/L (ref 0.0–2.0)
Bicarbonate: 29.9 mmol/L — ABNORMAL HIGH (ref 20.0–28.0)
Calcium, Ion: 1.27 mmol/L (ref 1.15–1.40)
HCT: 39 % (ref 36.0–46.0)
Hemoglobin: 13.3 g/dL (ref 12.0–15.0)
O2 Saturation: 98 %
Patient temperature: 98.7
Potassium: 3.8 mmol/L (ref 3.5–5.1)
Sodium: 136 mmol/L (ref 135–145)
TCO2: 32 mmol/L (ref 22–32)
pCO2 arterial: 58 mmHg — ABNORMAL HIGH (ref 32.0–48.0)
pH, Arterial: 7.32 — ABNORMAL LOW (ref 7.350–7.450)
pO2, Arterial: 116 mmHg — ABNORMAL HIGH (ref 83.0–108.0)

## 2021-06-18 LAB — CBC WITH DIFFERENTIAL/PLATELET
Abs Immature Granulocytes: 0.13 10*3/uL — ABNORMAL HIGH (ref 0.00–0.07)
Basophils Absolute: 0 10*3/uL (ref 0.0–0.1)
Basophils Relative: 0 %
Eosinophils Absolute: 0 10*3/uL (ref 0.0–0.5)
Eosinophils Relative: 0 %
HCT: 42.6 % (ref 36.0–46.0)
Hemoglobin: 14 g/dL (ref 12.0–15.0)
Immature Granulocytes: 1 %
Lymphocytes Relative: 6 %
Lymphs Abs: 0.7 10*3/uL (ref 0.7–4.0)
MCH: 31.4 pg (ref 26.0–34.0)
MCHC: 32.9 g/dL (ref 30.0–36.0)
MCV: 95.5 fL (ref 80.0–100.0)
Monocytes Absolute: 1.2 10*3/uL — ABNORMAL HIGH (ref 0.1–1.0)
Monocytes Relative: 9 %
Neutro Abs: 10.6 10*3/uL — ABNORMAL HIGH (ref 1.7–7.7)
Neutrophils Relative %: 84 %
Platelets: 149 10*3/uL — ABNORMAL LOW (ref 150–400)
RBC: 4.46 MIL/uL (ref 3.87–5.11)
RDW: 13.9 % (ref 11.5–15.5)
WBC: 12.7 10*3/uL — ABNORMAL HIGH (ref 4.0–10.5)
nRBC: 0 % (ref 0.0–0.2)

## 2021-06-18 LAB — BRAIN NATRIURETIC PEPTIDE: B Natriuretic Peptide: 176.9 pg/mL — ABNORMAL HIGH (ref 0.0–100.0)

## 2021-06-18 LAB — TROPONIN I (HIGH SENSITIVITY): Troponin I (High Sensitivity): 713 ng/L (ref <18)

## 2021-06-18 MED ORDER — SODIUM CHLORIDE 0.9 % IV SOLN
2.0000 g | INTRAVENOUS | Status: AC
  Administered 2021-06-19: 2 g via INTRAVENOUS
  Filled 2021-06-18: qty 2

## 2021-06-18 MED ORDER — VANCOMYCIN HCL 2000 MG/400ML IV SOLN
2000.0000 mg | INTRAVENOUS | Status: AC
  Administered 2021-06-19: 2000 mg via INTRAVENOUS
  Filled 2021-06-18: qty 400

## 2021-06-18 NOTE — ED Triage Notes (Signed)
Pt bib [facility] from home due to altered mental status. LKN @ about 1500, niece came home at about 2030 and found pt to not be acting her self. Upon EMS arrival pts O2 sats were 79% on RA, placed on 10L NRB. Pt has had a productive grey cough for a few days. [place] heard.  Pt is a DNR, going through process of getting on hospice. Takes oxycodone. Pupils a 2 and sluggish. Pt able to answer all question but zoning out on triage.   Vitals: BP: 124/94 CBG: 118

## 2021-06-18 NOTE — ED Provider Notes (Signed)
[facility] EMERGENCY DEPARTMENT Provider Note   CSN: [id number] Arrival date & time: 06/18/21  2157     History No chief complaint on file.   Joanne Klein is a 75 y.o. 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  Patient's niece, Jonni Sanger, reports that a neighbor came to check on the patient around 15:00 and noted she was more drowsy, but when the patient's daughter came to check on her around 20:30 and reports that patient was confused.  She is typically alert and oriented x3 at baseline, but was "talking out of her head."  States that her extremities also feel very cool and distal findings.  Given presentation, she was prompted to call EMS.  EMS reports that on arrival patient's oxygen saturations were 79% on room air and patient was placed on 10L NRB.   Family reports that the patient has had a productive cough for about a year.  However, the patient's reports that she has been more short of breath over the last 2 to 3 days.  She does not feel that shortness of breath has been worsening.  Reports that she has been feeling generally weak and fatigued today.  She also endorses dysuria and lower abdominal pain.  No fever, chills, chest pain, dizziness, lightheadedness, rash, vomiting, diarrhea, or leg swelling.  Patient just recently underwent biopsy for concern for recurrence of anal cancer.  Family reports that they met with hospice on Saturday area as well as hospice, social work, and a Clinical biochemist earlier this week.  She recently signed a DNR.  No other goals of care discussions at this time.  Patient lives alone, but her daughter checks on her frequently.  She is in the process of getting home health set up.  She reports compliance with all of her medications,  specifically her Xarelto.  The history is provided by the patient and medical records. No language interpreter was used.      Past Medical History:  Diagnosis Date   Allergic rhinitis    Anemia    during chemo treatments   Arthritis    Bursitis    Both Shoulders   Chronic low back pain    Degenerative disk disease    Dementia (HCC)    Depression    Diverticulitis    DVT (deep venous thrombosis) (HCC)    Emphysema of lung (HCC)    Fibromyalgia    GERD (gastroesophageal reflux disease)    History of fibromyalgia    Hypertension    occassionally does not take medications   Hypothyroidism    Irritable bowel syndrome    Nerve damage    both lower ext. due to radiation   Neuralgia, post-herpetic    Obesity, unspecified    Obstructive sleep apnea    Does not use CPAP   OCD (obsessive compulsive disorder)    Pain in joint, site unspecified    Pneumonia    Pure hypercholesterolemia    Scoliosis    Situational stress    Spastic colon    Tubular adenoma    Unable to walk    or stand   Uterine cancer [facility])     Patient Active Problem List   Diagnosis Date Noted   Pneumonia 06/19/2021   Gait abnormality 10/25/2020  Vitamin B12 deficiency 10/25/2020   Heme positive stool    Acute blood loss anemia    History of anal cancer    History of pulmonary embolism    Pulmonary embolism ([place]) 09/27/2020   Pain    Lumbar radiculopathy    Weakness of both legs 06/19/2020   Falls frequently 06/19/2020   Weakness 06/19/2020   Chronic respiratory failure with hypoxia (HCC) 12/15/2019   Orthostatic hypotension 12/15/2019   Numbness and tingling 12/15/2019   Chemotherapy-induced neutropenia (HCC)    Thrombocytopenia ([place]) 10/17/2019   Anal cancer ([place]) 09/15/2019   Routine general medical examination at a health care facility 11/19/2016   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   Morbid obesity ([place]) 06/26/2014   COPD GOLD II     Hyperlipidemia    Arthritis 01/16/2014   Depression 01/13/2014    Hypothyroidism 01/13/2014   GERD (gastroesophageal reflux disease) 01/13/2014   OCD (obsessive compulsive disorder) 01/13/2014   Fibromyalgia 01/13/2014    Past Surgical History:  Procedure Laterality Date   ABDOMINAL HYSTERECTOMY     BIOPSY  10/08/2020   Procedure: BIOPSY;  Surgeon: Jackquline Denmark, MD;  Location: [facility];  Service: Gastroenterology;;   BREAST LUMPECTOMY     knot removed   CHOLECYSTECTOMY     COLONOSCOPY WITH PROPOFOL N/A 10/08/2020   Procedure: COLONOSCOPY WITH PROPOFOL;  Surgeon: Jackquline Denmark, MD;  Location: [facility];  Service: Gastroenterology;  Laterality: N/A;   ESOPHAGOGASTRODUODENOSCOPY (EGD) WITH PROPOFOL N/A 10/08/2020   Procedure: ESOPHAGOGASTRODUODENOSCOPY (EGD) WITH PROPOFOL;  Surgeon: Jackquline Denmark, MD;  Location: [facility];  Service: Gastroenterology;  Laterality: N/A;   EVALUATION UNDER ANESTHESIA WITH ANAL FISSUROTOMY N/A 06/06/2021   Procedure: ANAL EXAM UNDER ANESTHESIA WITH BIOPSY;  Surgeon: Leighton Ruff, MD;  Location: [facility];  Service: General;  Laterality: N/A;   HOT HEMOSTASIS N/A 10/08/2020   Procedure: HOT HEMOSTASIS (ARGON PLASMA COAGULATION/BICAP);  Surgeon: Jackquline Denmark, MD;  Location: [facility];  Service: Gastroenterology;  Laterality: N/A;   POLYPECTOMY  10/08/2020   Procedure: POLYPECTOMY;  Surgeon: Jackquline Denmark, MD;  Location: [facility];  Service: Gastroenterology;;   RADIOLOGY WITH ANESTHESIA Bilateral 06/14/2020   Procedure: MRI WITH ANESTHESIA;  Surgeon: Radiologist, Medication, MD;  Location: [place];  Service: Radiology;  Laterality: Bilateral;   RADIOLOGY WITH ANESTHESIA N/A 06/21/2020   Procedure: MRI WITH ANESTHESIA;  Surgeon: Radiologist, Medication, MD;  Location: [place];  Service: Radiology;  Laterality: N/A;   TONSILLECTOMY     tumor in left forearm       OB History   No obstetric history on file.     Family History  Problem Relation Age of Onset   Stomach cancer Father    Liver cancer Father     Diabetes Father    Other Mother        unsure of medical history   Colon cancer Neg Hx     Social History   Tobacco Use   Smoking status: Former    Packs/day: 2.00    Years: 34.00    Pack years: 68.00    Types: Cigarettes    Quit date: 12/15/1996    Years since quitting: 24.5   Smokeless tobacco: Never  Vaping Use   Vaping Use: Every day   Substances: Nicotine  Substance Use Topics   Alcohol use: Not Currently   Drug use: No    Home Medications Prior to Admission medications   Medication Sig Start Date End Date Taking? Authorizing Provider  atorvastatin (LIPITOR) 20 MG tablet Take 1  tablet (20 mg total) by mouth 4 (four) times a week. Take 1 tablet (20 mg) by mouth in the evening Saturdays, Sundays, Tuesdays & Thursdays. 6/72/09   Leighton Ruff, MD  calcium carbonate (TUMS - DOSED IN MG ELEMENTAL CALCIUM) 500 MG chewable tablet Chew 1 tablet by mouth daily as needed for indigestion or heartburn.     [provider]  Cholecalciferol (VITAMIN D-3) 1000 UNITS CAPS Take 1,000 Units by mouth at bedtime.    [provider]  Elastic Bandages & Supports (MEDICAL COMPRESSION STOCKINGS) MISC Use daily 01/03/21   Hoyt Koch, MD  levothyroxine (SYNTHROID) 75 MCG tablet TAKE 1 TABLET BY MOUTH  DAILY BEFORE BREAKFAST Patient taking differently: Take 75 mcg by mouth daily before breakfast. 01/01/21   Hoyt Koch, MD  methadone (DOLOPHINE) 5 MG tablet Take 5 mg by mouth every 12 (twelve) hours. 05/15/21   [provider]  omeprazole (PRILOSEC) 40 MG capsule TAKE 1 CAPSULE BY MOUTH  DAILY Patient taking differently: Take 40 mg by mouth in the morning. 01/01/21   Hoyt Koch, MD  ondansetron (ZOFRAN) 4 MG tablet TAKE 1 TABLET(4 MG) BY MOUTH EVERY 8 HOURS AS NEEDED FOR NAUSEA OR VOMITING Patient taking differently: Take 4 mg by mouth every 8 (eight) hours as needed for nausea or vomiting. 05/23/21   Hoyt Koch, MD   oxyCODONE-acetaminophen (PERCOCET) 10-325 MG tablet Take 1 tablet by mouth every 4 (four) hours as needed for pain. 05/16/21   [provider]  phenylephrine-shark liver oil-mineral oil-petrolatum (PREPARATION H) 0.25-3-14-71.9 % rectal ointment Place 1 application rectally 2 (two) times daily as needed for hemorrhoids.    [provider]  Polyethylene Glycol 3350 (MIRALAX PO) Take 17 g by mouth daily as needed (constipation).    [provider]  pregabalin (LYRICA) 75 MG capsule Take 1 capsule (75 mg total) by mouth 2 (two) times daily. Patient not taking: Reported on 06/05/2021 01/14/21   Hoyt Koch, MD  rivaroxaban (XARELTO) 20 MG TABS tablet Take 1 tablet (20 mg total) by mouth daily with supper. Patient taking differently: Take 20 mg by mouth at bedtime. 01/10/21   Hoyt Koch, MD  sertraline (ZOLOFT) 100 MG tablet TAKE 2 TABLETS BY MOUTH AT  BEDTIME Patient taking differently: Take 200 mg by mouth at bedtime. 01/01/21   Hoyt Koch, MD  vitamin B-12 (CYANOCOBALAMIN) 1000 MCG tablet Take 1,000 mcg by mouth in the morning. 12/24/20   [provider]    Allergies    Prozac [fluoxetine hcl], Codeine, Morphine and related, and Sulfa antibiotics  Review of Systems   Review of Systems  Constitutional:  Negative for activity change, chills, diaphoresis and fever.  HENT:  Negative for congestion and sore throat.   Respiratory:  Positive for cough and shortness of breath.   Cardiovascular:  Negative for chest pain, palpitations and leg swelling.  Gastrointestinal:  Positive for abdominal pain. Negative for blood in stool, constipation, diarrhea, nausea and vomiting.  Genitourinary:  Positive for dysuria. Negative for frequency, hematuria and urgency.  Musculoskeletal:  Negative for back pain, joint swelling, myalgias, neck pain and neck stiffness.  Skin:  Negative for rash and wound.  Allergic/Immunologic: Negative for  immunocompromised state.  Neurological:  Negative for seizures, syncope, weakness, numbness and headaches.  Psychiatric/Behavioral:  Positive for confusion.    Physical Exam Updated Vital Signs BP 104/62   Pulse 92   Temp 99.1 F (37.3 C) (Rectal)   Resp 20  Ht _0  (1.676 m)   Wt 113.4 kg   LMP  (LMP Unknown)   SpO2 99%   BMI 40.35 kg/m   Physical Exam Vitals and nursing note reviewed.  Constitutional:      General: She is not in acute distress.    Appearance: She is not toxic-appearing.     Comments: Chronically ill-appearing Nonrebreather in place  HENT:     Head: Normocephalic.  Eyes:     Conjunctiva/sclera: Conjunctivae normal.  Cardiovascular:     Rate and Rhythm: Regular rhythm. Tachycardia present.     Heart sounds: No murmur heard.   No friction rub. No gallop.  Pulmonary:     Effort: Pulmonary effort is normal. No respiratory distress.     Comments: Rhonchorous breath sounds No tachypnea or retractions Patient with productive cough with production of large amount of mucus on exam Abdominal:     General: There is no distension.     Palpations: Abdomen is soft. There is no mass.     Tenderness: There is no abdominal tenderness. There is no right CVA tenderness, left CVA tenderness, guarding or rebound.     Hernia: No hernia is present.     Comments: Abdomen soft, nontender, nondistended.  Lesion noted in the perirectal region with surrounding ecchymosis to the skin and purulence.  Musculoskeletal:     Cervical back: Normal range of motion and neck supple.     Right lower leg: No edema.     Left lower leg: No edema.  Skin:    General: Skin is warm.     Findings: No rash.  Neurological:     Mental Status: She is alert.     Comments: GCS 15.  Alert and oriented x4.  Psychiatric:        Behavior: Behavior normal.    ED Results / Procedures / Treatments   Labs (all labs ordered are listed, but only abnormal results are displayed) Labs Reviewed   COMPREHENSIVE METABOLIC PANEL - Abnormal; Notable for the following components:      Result Value   Glucose, Bld 120 (*)    Creatinine, Ser 1.17 (*)    Albumin 2.9 (*)    GFR, Estimated 49 (*)    All other components within normal limits  CBC WITH DIFFERENTIAL/PLATELET - Abnormal; Notable for the following components:   WBC 12.7 (*)    Platelets 149 (*)    Neutro Abs 10.6 (*)    Monocytes Absolute 1.2 (*)    Abs Immature Granulocytes 0.13 (*)    All other components within normal limits  BRAIN NATRIURETIC PEPTIDE - Abnormal; Notable for the following components:   B Natriuretic Peptide 176.9 (*)    All other components within normal limits  LACTIC ACID, PLASMA - Abnormal; Notable for the following components:   Lactic Acid, Venous 2.5 (*)    All other components within normal limits  I-STAT ARTERIAL BLOOD GAS, ED - Abnormal; Notable for the following components:   pH, Arterial 7.320 (*)    pCO2 arterial 58.0 (*)    pO2, Arterial 116 (*)    Bicarbonate 29.9 (*)    All other components within normal limits  TROPONIN I (HIGH SENSITIVITY) - Abnormal; Notable for the following components:   Troponin I (High Sensitivity) 713 (*)    All other components within normal limits  TROPONIN I (HIGH SENSITIVITY) - Abnormal; Notable for the following components:   Troponin I (High Sensitivity) 915 (*)    All other  components within normal limits  RESP PANEL BY RT-PCR (FLU A&B, COVID) ARPGX2  URINE CULTURE  CULTURE, BLOOD (ROUTINE X 2)  CULTURE, BLOOD (ROUTINE X 2)  URINALYSIS, ROUTINE W REFLEX MICROSCOPIC  LACTIC ACID, PLASMA    EKG EKG Interpretation  Date/Time:  Tuesday June 18 2021 22:02:50 EDT Ventricular Rate:  118 PR Interval:  142 QRS Duration: 96 QT Interval:  346 QTC Calculation: 485 R Axis:   -70 Text Interpretation: Sinus tachycardia Anterolateral infarct, age indeterminate No significant change was found Confirmed by Ezequiel Essex [phone]) on 06/18/2021 11:01:25  PM  Radiology DG Chest Port 1 View  Result Date: 06/18/2021 CLINICAL DATA:  Dyspnea, altered mental status EXAM: PORTABLE CHEST 1 VIEW COMPARISON:  09/27/2020 FINDINGS: Lung volumes are small. Left basilar focal pulmonary infiltrate has developed, possibly infectious in the appropriate clinical setting. No pneumothorax. Tiny left pleural effusion. Cardiac size within normal limits. No acute bone abnormality. IMPRESSION: Left basilar focal pulmonary infiltrate, possibly infectious, with associated tiny left parapneumonic effusion. Electronically Signed   By: Fidela Salisbury MD   On: 06/18/2021 22:59    Procedures .Critical Care  Date/Time: 06/19/2021 2:04 AM Performed by: Joanne Gavel, PA-C Authorized by: Joanne Gavel, PA-C   Critical care provider statement:    Critical care time (minutes):  65   Critical care time was exclusive of:  Separately billable procedures and treating other patients and teaching time   Critical care was necessary to treat or prevent imminent or life-threatening deterioration of the following conditions:  Respiratory failure   Critical care was time spent personally by me on the following activities:  Ordering and performing treatments and interventions, ordering and review of laboratory studies, ordering and review of radiographic studies, pulse oximetry, obtaining history from patient or surrogate, examination of patient, evaluation of patient's response to treatment and development of treatment plan with patient or surrogate   I assumed direction of critical care for this patient from another provider in my specialty: no     Care discussed with: admitting provider     Medications Ordered in ED Medications  vancomycin (VANCOREADY) IVPB 2000 mg/400 mL (2,000 mg Intravenous New Bag/Given 06/19/21 0056)  lactated ringers bolus 1,000 mL (has no administration in time range)  guaiFENesin (ROBITUSSIN) 100 MG/5ML solution 100 mg (has no administration in time range)   ceFEPIme (MAXIPIME) 2 g in sodium chloride 0.9 % 100 mL IVPB (0 g Intravenous Stopped 06/19/21 0035)  sodium chloride 0.9 % bolus 1,000 mL (0 mLs Intravenous Stopped 06/19/21 0138)    ED Course  I have reviewed the triage vital signs and the nursing notes.  Pertinent labs & imaging results that were available during my care of the patient were reviewed by me and considered in my medical decision making (see chart for details).    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  Family reports that she is now back to baseline, which is alert and oriented x3, in the emergency department.  She has been more short of breath over the last 2 to 3 days.  She has a history of a chronic productive cough.  She  is also endorsing dysuria.  Patient satting in the low 90s on nonrebreather.  Attempted to transition to nasal cannula on 6 L and she was shortly able to maintain oxygen saturation, but then desaturated and was placed on high flow nasal cannula.  She is tachycardic.  Afebrile.  Rectal temp is 99.1.  Normotensive.  The patient has been seen and independently evaluated by Dr. Wyvonnia Dusky, attending physician.  Labs and imaging of been reviewed and independently interpreted by me.  She has a respiratory acidosis.  Tachycardic on arrival.  Afebrile, even when checking rectal temp.  Lactate 2.5 and she has been treated with IV fluid bolus x2 given concern for occult sepsis.  She has been normotensive, but she did have 2 isolated blood pressures that were low, but this appears to be positional.  Chest x-ray with left basilar infiltrate.  EKG with sinus tachycardia.  Initial troponin is 713 -->915.  Patient has adamantly denied chest pain since arrival in  the ED.  Given that patient was found to be satting at 79% when family went to check on her, it is unclear how long she was hypoxic prior to arrival and could be elevated due to demand ischemia.  I have less suspicion for ACS.  I did consider that this could be secondary to a PE given tachycardia and hypoxia, but patient declined PE study.  We discussed the risks versus benefits of obtaining the PE study, and she continued to decline this as well as CT abdomen pelvis.  She has been started on cefepime.  Patient has been stable since she was transition from nonrebreather to high flow nasal cannula.  Consult to the hospitalist team and Dr. Jeannine Kitten accept the patient for admission  The patient appears reasonably stabilized for admission considering the current resources, flow, and capabilities available in the ED at this time, and I doubt any other [facility] requiring further screening and/or treatment in the ED prior to admission.  Final Clinical Impression(s) / ED Diagnoses Final diagnoses:  Sepsis with acute hypoxic respiratory failure without septic shock, due to unspecified organism [facility])  Pneumonia of left lower lobe due to infectious organism    Rx / DC Orders ED Discharge Orders     None        Joanne Gavel, PA-C 06/19/21 0209    Ezequiel Essex, MD 06/20/21 [phone]

## 2021-06-19 ENCOUNTER — Encounter (HOSPITAL_COMMUNITY): Payer: Self-pay | Admitting: Internal Medicine

## 2021-06-19 ENCOUNTER — Emergency Department (HOSPITAL_COMMUNITY)

## 2021-06-19 DIAGNOSIS — F0390 Unspecified dementia without behavioral disturbance: Secondary | ICD-10-CM | POA: Diagnosis present

## 2021-06-19 DIAGNOSIS — R778 Other specified abnormalities of plasma proteins: Secondary | ICD-10-CM

## 2021-06-19 DIAGNOSIS — J9601 Acute respiratory failure with hypoxia: Secondary | ICD-10-CM | POA: Diagnosis not present

## 2021-06-19 DIAGNOSIS — R6520 Severe sepsis without septic shock: Secondary | ICD-10-CM

## 2021-06-19 DIAGNOSIS — E041 Nontoxic single thyroid nodule: Secondary | ICD-10-CM | POA: Diagnosis present

## 2021-06-19 DIAGNOSIS — J189 Pneumonia, unspecified organism: Secondary | ICD-10-CM | POA: Diagnosis present

## 2021-06-19 DIAGNOSIS — E7800 Pure hypercholesterolemia, unspecified: Secondary | ICD-10-CM | POA: Diagnosis present

## 2021-06-19 DIAGNOSIS — G9341 Metabolic encephalopathy: Secondary | ICD-10-CM | POA: Diagnosis present

## 2021-06-19 DIAGNOSIS — F32A Depression, unspecified: Secondary | ICD-10-CM | POA: Diagnosis present

## 2021-06-19 DIAGNOSIS — N390 Urinary tract infection, site not specified: Secondary | ICD-10-CM | POA: Diagnosis present

## 2021-06-19 DIAGNOSIS — E872 Acidosis: Secondary | ICD-10-CM | POA: Diagnosis present

## 2021-06-19 DIAGNOSIS — N179 Acute kidney failure, unspecified: Secondary | ICD-10-CM | POA: Diagnosis present

## 2021-06-19 DIAGNOSIS — Z85048 Personal history of other malignant neoplasm of rectum, rectosigmoid junction, and anus: Secondary | ICD-10-CM | POA: Diagnosis not present

## 2021-06-19 DIAGNOSIS — D6959 Other secondary thrombocytopenia: Secondary | ICD-10-CM | POA: Diagnosis present

## 2021-06-19 DIAGNOSIS — A419 Sepsis, unspecified organism: Principal | ICD-10-CM

## 2021-06-19 DIAGNOSIS — I10 Essential (primary) hypertension: Secondary | ICD-10-CM | POA: Diagnosis present

## 2021-06-19 DIAGNOSIS — Z86711 Personal history of pulmonary embolism: Secondary | ICD-10-CM | POA: Diagnosis not present

## 2021-06-19 DIAGNOSIS — Z515 Encounter for palliative care: Secondary | ICD-10-CM | POA: Diagnosis not present

## 2021-06-19 DIAGNOSIS — Z86718 Personal history of other venous thrombosis and embolism: Secondary | ICD-10-CM | POA: Diagnosis not present

## 2021-06-19 DIAGNOSIS — J439 Emphysema, unspecified: Secondary | ICD-10-CM | POA: Diagnosis present

## 2021-06-19 DIAGNOSIS — N3000 Acute cystitis without hematuria: Secondary | ICD-10-CM

## 2021-06-19 DIAGNOSIS — Z66 Do not resuscitate: Secondary | ICD-10-CM | POA: Diagnosis present

## 2021-06-19 DIAGNOSIS — K219 Gastro-esophageal reflux disease without esophagitis: Secondary | ICD-10-CM | POA: Diagnosis present

## 2021-06-19 DIAGNOSIS — Z20822 Contact with and (suspected) exposure to covid-19: Secondary | ICD-10-CM | POA: Diagnosis present

## 2021-06-19 DIAGNOSIS — E039 Hypothyroidism, unspecified: Secondary | ICD-10-CM | POA: Diagnosis present

## 2021-06-19 DIAGNOSIS — Z90710 Acquired absence of both cervix and uterus: Secondary | ICD-10-CM | POA: Diagnosis not present

## 2021-06-19 DIAGNOSIS — E538 Deficiency of other specified B group vitamins: Secondary | ICD-10-CM | POA: Diagnosis present

## 2021-06-19 DIAGNOSIS — M797 Fibromyalgia: Secondary | ICD-10-CM | POA: Diagnosis present

## 2021-06-19 LAB — TROPONIN I (HIGH SENSITIVITY)
Troponin I (High Sensitivity): 915 ng/L (ref <18)
Troponin I (High Sensitivity): 4034 ng/L (ref <18)

## 2021-06-19 LAB — RESP PANEL BY RT-PCR (FLU A&B, COVID) ARPGX2
Influenza A by PCR: NEGATIVE
Influenza B by PCR: NEGATIVE
SARS Coronavirus 2 by RT PCR: NEGATIVE

## 2021-06-19 LAB — URINALYSIS, ROUTINE W REFLEX MICROSCOPIC
Bilirubin Urine: NEGATIVE
Glucose, UA: NEGATIVE mg/dL
Hgb urine dipstick: NEGATIVE
Ketones, ur: NEGATIVE mg/dL
Nitrite: NEGATIVE
Protein, ur: 100 mg/dL — AB
Specific Gravity, Urine: 1.027 (ref 1.005–1.030)
WBC, UA: >50 WBC/hpf — ABNORMAL HIGH (ref 0–5)
pH: 5 (ref 5.0–8.0)

## 2021-06-19 LAB — GLUCOSE, CAPILLARY: Glucose-Capillary: 87 mg/dL (ref 70–99)

## 2021-06-19 LAB — LACTIC ACID, PLASMA
Lactic Acid, Venous: 2.5 mmol/L (ref 0.5–1.9)
Lactic Acid, Venous: 1.4 mmol/L (ref 0.5–1.9)

## 2021-06-19 LAB — MRSA NEXT GEN BY PCR, NASAL: MRSA by PCR Next Gen: NOT DETECTED

## 2021-06-19 MED ORDER — OXYCODONE-ACETAMINOPHEN 10-325 MG PO TABS: 1.0000 | ORAL_TABLET | Freq: Four times a day (QID) | ORAL | Status: DC | PRN

## 2021-06-19 MED ORDER — METHADONE HCL 10 MG PO TABS
5.0000 mg | ORAL_TABLET | Freq: Four times a day (QID) | ORAL | Status: DC
  Administered 2021-06-19 – 2021-06-21 (×5): 5 mg via ORAL
  Filled 2021-06-19 (×6): qty 1

## 2021-06-19 MED ORDER — VANCOMYCIN HCL 1000 MG/200ML IV SOLN
1000.0000 mg | INTRAVENOUS | Status: DC
  Administered 2021-06-19: 1000 mg via INTRAVENOUS
  Filled 2021-06-19: qty 200

## 2021-06-19 MED ORDER — SODIUM CHLORIDE 0.9 % IV SOLN: INTRAVENOUS | Status: AC

## 2021-06-19 MED ORDER — SODIUM CHLORIDE 0.9 % IV BOLUS
1000.0000 mL | Freq: Once | INTRAVENOUS | Status: AC
  Administered 2021-06-19: 1000 mL via INTRAVENOUS

## 2021-06-19 MED ORDER — OXYCODONE HCL 5 MG PO TABS: 5.0000 mg | ORAL_TABLET | Freq: Four times a day (QID) | ORAL | Status: DC | PRN

## 2021-06-19 MED ORDER — ACETAMINOPHEN 325 MG PO TABS: 650.0000 mg | ORAL_TABLET | Freq: Four times a day (QID) | ORAL | Status: DC | PRN

## 2021-06-19 MED ORDER — SODIUM CHLORIDE 0.9 % IV SOLN
2.0000 g | Freq: Two times a day (BID) | INTRAVENOUS | Status: AC
  Administered 2021-06-19 – 2021-06-23 (×9): 2 g via INTRAVENOUS
  Filled 2021-06-19 (×11): qty 2

## 2021-06-19 MED ORDER — ACETAMINOPHEN 650 MG RE SUPP: 650.0000 mg | Freq: Four times a day (QID) | RECTAL | Status: DC | PRN

## 2021-06-19 MED ORDER — OXYCODONE-ACETAMINOPHEN 5-325 MG PO TABS
1.0000 | ORAL_TABLET | Freq: Four times a day (QID) | ORAL | Status: DC | PRN
Start: 2021-06-19 — End: 2021-06-21
  Administered 2021-06-19: 1 via ORAL
  Filled 2021-06-19: qty 1

## 2021-06-19 MED ORDER — GUAIFENESIN 100 MG/5ML PO SOLN
5.0000 mL | Freq: Once | ORAL | Status: AC
  Administered 2021-06-19: 100 mg via ORAL
  Filled 2021-06-19 (×2): qty 5

## 2021-06-19 MED ORDER — LACTATED RINGERS IV BOLUS
1000.0000 mL | Freq: Once | INTRAVENOUS | Status: AC
  Administered 2021-06-19: 1000 mL via INTRAVENOUS

## 2021-06-19 NOTE — ED Notes (Signed)
Paged admit MD per RN request

## 2021-06-19 NOTE — ED Notes (Signed)
Attempted to call report

## 2021-06-19 NOTE — Progress Notes (Signed)
[facility]) hospitalized hospice patient.   This patient is a current [facility] hospice patient with a terminal Diagnosis of anal cancer. Hospice nurse attempted to visit today and patient was not home. Contacted family member who reported patient was transported to ED for evaluation. Per ED notes patient had been more drowsy and confused. EMS was activated and patient was transported to [facility] ED. Patient was admitted to [facility] on 6.29.2022 with a diagnosis of pneumonia and sepsis. Per Dr. Tomasa Hosteller with [facility] this is a related hospital admission.   Visited patient at bedside. No family present. She is alert and oriented, lying in bed.  She states she feels better but not good. She was using her incentive spirometer. She is on oxygen HFNC.  I explained my role with hospice as she is new to our service and offered support.   This patient is appropriate for appropriate for inpatient services due to treatment  pneumonia and sepsis.   VS: 98.6, 96/75, 76, 20, 93% on 8L HFNC I/O: 1223/200  Abn Labs: 06/19/2021 06:57 Troponin I (High Sensitivity): 4,034 (HH)  06/19/2021 00:16 Troponin I (High Sensitivity): 915 (HH)  06/18/2021 23:41 pH, Arterial: 7.320 (L) pCO2 arterial: 58.0 (H) pO2, Arterial: 116 (H) Bicarbonate: 29.9 (H)  06/18/2021 22:37 Glucose: 120 (H) Creatinine: 1.17 (H) Albumin: 2.9 (L) GFR, Estimated: 49 (L) B Natriuretic Peptide: 176.9 (H) Troponin I (High Sensitivity): 713 (HH) WBC: 12.7 (H) Platelets: 149 (L)  Diagnostics: CXR: IMPRESSION: Left basilar focal pulmonary infiltrate, possibly infectious, with associated tiny left parapneumonic effusion.  IV/PRN Medications: Maxipime 2g IVPB BID, Vancomycin 1000mg  IVPB QD, NS@125ml /hr No PRNs given.  Problem list:  Principal Problem:   Pneumonia Active Problems:   UTI (urinary tract infection)   Severe sepsis (HCC)   Acute hypoxemic respiratory failure (HCC)   Elevated troponin  Severe sepsis  secondary to community-acquired pneumonia and UTI Acute hypoxemic respiratory failure secondary to community-acquired pneumonia Elevated troponin AKI Acute metabolic encephalopathy Anal squamous cell carcinoma History of DVT and PE Hyperlipidemia Hypothyroidism Chronic back pain  Discharge Planning: Return home with hospice services when stable for discharge.  Family Contact: Spoke with niece, Bernadette Hoit and updated her.   IDG: Updated  Goals of Care: Clear. Treat for current infection and maintain comfort. DNR   Should patient need ambulance transport at discharge please use [facility].  A Please do not hesitate to call with questions.    Thank you,   Farrel Gordon, RN, [facility] Liaison  [phone]

## 2021-06-19 NOTE — ED Notes (Signed)
Meal tray ordered for pt  

## 2021-06-19 NOTE — ED Notes (Signed)
SDU 

## 2021-06-19 NOTE — H&P (Signed)
History and Physical    ACADIA THAMMAVONG LTJ:[id number] DOB: 1946-07-30 DOA: 06/18/2021  PCP: Hoyt Koch, MD Patient coming from: Home  Chief Complaint: Altered mental status  HPI: Joanne Klein is a 75 y.o. 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  Patient's niece reported that a neighbor came to check on the patient around 3 PM and noted that she was more drowsy but when the patient's daughter came to check on her around 8:30 PM she was found confused.  She is typically alert and oriented x3 at baseline.  Family felt that her extremities were cool to touch.  Upon EMS arrival, oxygen saturation was 79% on room air and she was placed on 10 L oxygen via nonrebreather.  Family also reported a productive cough.  Patient just recently underwent biopsy due to concern for recurrence of anal cancer.  Family reported that they met with hospice and signed a DNR.  In the ED, patient was tachycardic and hypotensive.  Requiring 15 L via nonrebreather to maintain appropriate oxygen saturation.  Labs showing WBC 12.7, hemoglobin 14.0, platelet count 149K.  Sodium 135, potassium 3.7, chloride 99, bicarb 24, BUN 19, creatinine 1.1 (baseline 0.7-0.8), glucose 120.  LFTs normal.  Lactic acid 2.5 >1.4.  BNP 176.  High-sensitivity troponin 713> 915.  EKG without acute ischemic changes.  Blood culture x2 pending.  COVID and influenza PCR negative.  ABG with pH 7.32, PCO2 58, PO2 116.  UA with large amount of leukocytes, greater than 50  WBCs, and few bacteria.  Chest x-ray showing left basilar focal pulmonary infiltrate with associated tiny left parapneumonic effusion. Patient was given vancomycin, cefepime, and fluid boluses.  Head CT pending.  Patient states she has been coughing for several months and her cough is productive of yellow sputum.  Also reports shortness of breath.  Denies chest pain.  She is endorsing urinary frequency but no dysuria.  Denies nausea, vomiting, or abdominal pain.  She is constipated sometimes but did have a bowel movement yesterday.  She takes oxycodone 10 mg 4 times a day and methadone for chronic back pain.  States she was previously taking methadone 5 mg twice daily but her doctor recently increased the dose.  She does not know the exact dose she is taking at present.  She thinks she might have taken too much pain medicine.  Review of Systems:  All systems reviewed and apart from history of presenting illness, are negative.  Past Medical History:  Diagnosis Date   Allergic rhinitis    Anemia    during chemo treatments   Arthritis    Bursitis    Both Shoulders   Chronic low back pain    Degenerative disk disease    Dementia (HCC)    Depression    Diverticulitis    DVT (deep venous thrombosis) (HCC)    Emphysema of lung (HCC)    Fibromyalgia    GERD (gastroesophageal reflux disease)    History of fibromyalgia    Hypertension    occassionally does not take medications   Hypothyroidism    Irritable bowel syndrome  Nerve damage    both lower ext. due to radiation   Neuralgia, post-herpetic    Obesity, unspecified    Obstructive sleep apnea    Does not use CPAP   OCD (obsessive compulsive disorder)    Pain in joint, site unspecified    Pneumonia    Pure hypercholesterolemia    Scoliosis    Situational stress    Spastic colon    Tubular adenoma    Unable to walk    or stand   Uterine cancer [facility])     Past Surgical History:  Procedure Laterality Date   ABDOMINAL  HYSTERECTOMY     BIOPSY  10/08/2020   Procedure: BIOPSY;  Surgeon: Jackquline Denmark, MD;  Location: [facility];  Service: Gastroenterology;;   BREAST LUMPECTOMY     knot removed   CHOLECYSTECTOMY     COLONOSCOPY WITH PROPOFOL N/A 10/08/2020   Procedure: COLONOSCOPY WITH PROPOFOL;  Surgeon: Jackquline Denmark, MD;  Location: [facility];  Service: Gastroenterology;  Laterality: N/A;   ESOPHAGOGASTRODUODENOSCOPY (EGD) WITH PROPOFOL N/A 10/08/2020   Procedure: ESOPHAGOGASTRODUODENOSCOPY (EGD) WITH PROPOFOL;  Surgeon: Jackquline Denmark, MD;  Location: [facility];  Service: Gastroenterology;  Laterality: N/A;   EVALUATION UNDER ANESTHESIA WITH ANAL FISSUROTOMY N/A 06/06/2021   Procedure: ANAL EXAM UNDER ANESTHESIA WITH BIOPSY;  Surgeon: Leighton Ruff, MD;  Location: [facility];  Service: General;  Laterality: N/A;   HOT HEMOSTASIS N/A 10/08/2020   Procedure: HOT HEMOSTASIS (ARGON PLASMA COAGULATION/BICAP);  Surgeon: Jackquline Denmark, MD;  Location: [facility];  Service: Gastroenterology;  Laterality: N/A;   POLYPECTOMY  10/08/2020   Procedure: POLYPECTOMY;  Surgeon: Jackquline Denmark, MD;  Location: [facility];  Service: Gastroenterology;;   RADIOLOGY WITH ANESTHESIA Bilateral 06/14/2020   Procedure: MRI WITH ANESTHESIA;  Surgeon: Radiologist, Medication, MD;  Location: [place];  Service: Radiology;  Laterality: Bilateral;   RADIOLOGY WITH ANESTHESIA N/A 06/21/2020   Procedure: MRI WITH ANESTHESIA;  Surgeon: Radiologist, Medication, MD;  Location: [place];  Service: Radiology;  Laterality: N/A;   TONSILLECTOMY     tumor in left forearm       reports that she quit smoking about 24 years ago. Her smoking use included cigarettes. She has a 68.00 pack-year smoking history. She has never used smokeless tobacco. She reports previous alcohol use. She reports that she does not use drugs.  Allergies  Allergen Reactions   Prozac [Fluoxetine Hcl] Anaphylaxis   Codeine Itching   Morphine And Related Itching   Sulfa  Antibiotics Itching and Nausea Only    Family History  Problem Relation Age of Onset   Stomach cancer Father    Liver cancer Father    Diabetes Father    Other Mother        unsure of medical history   Colon cancer Neg Hx     Prior to Admission medications   Medication Sig Start Date End Date Taking? Authorizing Provider  atorvastatin (LIPITOR) 20 MG tablet Take 1 tablet (20 mg total) by mouth 4 (four) times a week. Take 1 tablet (20 mg) by mouth in the evening Saturdays, Sundays, Tuesdays & Thursdays. 4/82/70   Leighton Ruff, MD  calcium carbonate (TUMS - DOSED IN MG ELEMENTAL CALCIUM) 500 MG chewable tablet Chew 1 tablet by mouth daily as needed for indigestion or heartburn.     [provider]  Cholecalciferol (VITAMIN D-3) 1000 UNITS CAPS Take 1,000 Units by mouth at bedtime.    [provider]  Elastic Bandages & Supports ([place]) Lolita  Use daily 01/03/21   Hoyt Koch, MD  levothyroxine (SYNTHROID) 75 MCG tablet TAKE 1 TABLET BY MOUTH  DAILY BEFORE BREAKFAST Patient taking differently: Take 75 mcg by mouth daily before breakfast. 01/01/21   Hoyt Koch, MD  methadone (DOLOPHINE) 5 MG tablet Take 5 mg by mouth every 12 (twelve) hours. 05/15/21   [provider]  omeprazole (PRILOSEC) 40 MG capsule TAKE 1 CAPSULE BY MOUTH  DAILY Patient taking differently: Take 40 mg by mouth in the morning. 01/01/21   Hoyt Koch, MD  ondansetron (ZOFRAN) 4 MG tablet TAKE 1 TABLET(4 MG) BY MOUTH EVERY 8 HOURS AS NEEDED FOR NAUSEA OR VOMITING Patient taking differently: Take 4 mg by mouth every 8 (eight) hours as needed for nausea or vomiting. 05/23/21   Hoyt Koch, MD  oxyCODONE-acetaminophen (PERCOCET) 10-325 MG tablet Take 1 tablet by mouth every 4 (four) hours as needed for pain. 05/16/21   [provider]  phenylephrine-shark liver oil-mineral oil-petrolatum (PREPARATION H) 0.25-3-14-71.9 % rectal ointment  Place 1 application rectally 2 (two) times daily as needed for hemorrhoids.    [provider]  Polyethylene Glycol 3350 (MIRALAX PO) Take 17 g by mouth daily as needed (constipation).    [provider]  pregabalin (LYRICA) 75 MG capsule Take 1 capsule (75 mg total) by mouth 2 (two) times daily. Patient not taking: Reported on 06/05/2021 01/14/21   Hoyt Koch, MD  rivaroxaban (XARELTO) 20 MG TABS tablet Take 1 tablet (20 mg total) by mouth daily with supper. Patient taking differently: Take 20 mg by mouth at bedtime. 01/10/21   Hoyt Koch, MD  sertraline (ZOLOFT) 100 MG tablet TAKE 2 TABLETS BY MOUTH AT  BEDTIME Patient taking differently: Take 200 mg by mouth at bedtime. 01/01/21   Hoyt Koch, MD  vitamin B-12 (CYANOCOBALAMIN) 1000 MCG tablet Take 1,000 mcg by mouth in the morning. 12/24/20   [provider]    Physical Exam: Vitals:   06/19/21 0450 06/19/21 0513 06/19/21 0515 06/19/21 0530  BP: 126/71 118/67 (!) 117/57 (!) 98/54  Pulse: 85 87 80 76  Resp: 13 17 13 12   Temp:      TempSrc:      SpO2: 100% 97% 96% 94%  Weight:      Height:        Physical Exam Constitutional:      General: She is not in acute distress. HENT:     Head: Normocephalic and atraumatic.     Mouth/Throat:     Mouth: Mucous membranes are dry.  Eyes:     Extraocular Movements: Extraocular movements intact.     Conjunctiva/sclera: Conjunctivae normal.  Cardiovascular:     Rate and Rhythm: Normal rate and regular rhythm.     Pulses: Normal pulses.  Pulmonary:     Effort: Pulmonary effort is normal. No respiratory distress.     Breath sounds: Normal breath sounds. No wheezing or rales.  Abdominal:     General: Bowel sounds are normal. There is no distension.     Palpations: Abdomen is soft.     Tenderness: There is no abdominal tenderness.  Musculoskeletal:        General: No swelling or tenderness.     Cervical back: Normal range of motion and  neck supple.  Skin:    General: Skin is warm and dry.  Neurological:     Mental Status: She is alert and oriented to person, place, and time.  Labs on Admission: I have personally reviewed following labs and imaging studies  CBC: Recent Labs  Lab 06/18/21 2237 06/18/21 2341  WBC 12.7*  --   NEUTROABS 10.6*  --   HGB 14.0 13.3  HCT 42.6 39.0  MCV 95.5  --   PLT 149*  --    Basic Metabolic Panel: Recent Labs  Lab 06/18/21 2237 06/18/21 2341  NA 135 136  K 3.7 3.8  CL 99  --   CO2 24  --   GLUCOSE 120*  --   BUN 19  --   CREATININE 1.17*  --   CALCIUM 9.1  --    GFR: Estimated Creatinine Clearance: 54.7 mL/min (A) (by C-G formula based on SCr of 1.17 mg/dL (H)). Liver Function Tests: Recent Labs  Lab 06/18/21 2237  AST 23  ALT 18  ALKPHOS 84  BILITOT 0.9  PROT 6.5  ALBUMIN 2.9*   No results for input(s): LIPASE, AMYLASE in the last 168 hours. No results for input(s): AMMONIA in the last 168 hours. Coagulation Profile: No results for input(s): INR, PROTIME in the last 168 hours. Cardiac Enzymes: No results for input(s): CKTOTAL, CKMB, CKMBINDEX, TROPONINI in the last 168 hours. BNP (last 3 results) No results for input(s): PROBNP in the last 8760 hours. HbA1C: No results for input(s): HGBA1C in the last 72 hours. CBG: No results for input(s): GLUCAP in the last 168 hours. Lipid Profile: No results for input(s): CHOL, HDL, LDLCALC, TRIG, CHOLHDL, LDLDIRECT in the last 72 hours. Thyroid Function Tests: No results for input(s): TSH, T4TOTAL, FREET4, T3FREE, THYROIDAB in the last 72 hours. Anemia Panel: No results for input(s): VITAMINB12, FOLATE, FERRITIN, TIBC, IRON, RETICCTPCT in the last 72 hours. Urine analysis:    Component Value Date/Time   COLORURINE AMBER (A) 06/19/2021 0229   APPEARANCEUR CLOUDY (A) 06/19/2021 0229   LABSPEC 1.027 06/19/2021 0229   PHURINE 5.0 06/19/2021 0229   GLUCOSEU NEGATIVE 06/19/2021 0229   HGBUR NEGATIVE 06/19/2021  0229   BILIRUBINUR NEGATIVE 06/19/2021 0229   KETONESUR NEGATIVE 06/19/2021 0229   PROTEINUR 100 (A) 06/19/2021 0229   UROBILINOGEN 1.0 04/20/2008 0800   NITRITE NEGATIVE 06/19/2021 0229   LEUKOCYTESUR LARGE (A) 06/19/2021 0229    Radiological Exams on Admission: DG Chest Port 1 View  Result Date: 06/18/2021 CLINICAL DATA:  Dyspnea, altered mental status EXAM: PORTABLE CHEST 1 VIEW COMPARISON:  09/27/2020 FINDINGS: Lung volumes are small. Left basilar focal pulmonary infiltrate has developed, possibly infectious in the appropriate clinical setting. No pneumothorax. Tiny left pleural effusion. Cardiac size within normal limits. No acute bone abnormality. IMPRESSION: Left basilar focal pulmonary infiltrate, possibly infectious, with associated tiny left parapneumonic effusion. Electronically Signed   By: Fidela Salisbury MD   On: 06/18/2021 22:59    EKG: Independently reviewed.  Sinus tachycardia, no significant change since prior tracing.  Assessment/Plan Principal Problem:   Pneumonia Active Problems:   UTI (urinary tract infection)   Severe sepsis (HCC)   Acute hypoxemic respiratory failure (HCC)   Elevated troponin   Severe sepsis secondary to community-acquired pneumonia and UTI Tachycardic and hypotensive.  Labs showing mild leukocytosis and lactic acidosis.  Chest x-ray showing left basilar focal pulmonary infiltrate with associated tiny left parapneumonic effusion.  COVID and influenza PCR negative.  UA with large amount of leukocytes, greater than 50 WBCs, and few bacteria.  Tachycardia, hypotension, and lactic acidosis have resolved after 3 L fluid boluses.  Systolic currently in the 120s to 140s. -Continue vancomycin and cefepime.  Continue maintenance IV fluid.  MRSA PCR screen.  Blood culture x2 pending.  Add on urine culture.  Continue to monitor WBC count.  Acute hypoxemic respiratory failure secondary to community-acquired pneumonia Satting 79% on room air with EMS.   Currently satting well on 15 L oxygen via high flow nasal cannula.  Not tachypneic. -Continue supplemental oxygen, wean as tolerated.  Continue antibiotics for pneumonia.  Elevated troponin High-sensitivity troponin 713> 915.  EKG without acute ischemic changes.  Patient is not endorsing any chest pain.  Troponin elevation likely due to demand ischemia in the setting of severe sepsis, pneumonia, and respiratory failure. -Cardiac monitoring.  Trend troponin.  Consider ACS if troponin continues to rise.  AKI Likely prerenal in etiology.  Creatinine 1.1, baseline 0.7-0.8. -IV fluid hydration.  Monitor renal function and avoid nephrotoxic agents.  Acute metabolic encephalopathy Likely multifactorial from infection, severe sepsis, hypotension, acute hypoxemic respiratory failure, and opiate use for chronic pain.  Mental status has now improved significantly.  Currently AAO x4. -Head CT pending.  Avoid sedating medications at this time.  Anal squamous cell carcinoma Status post chemo and radiation in 2020 and was in remission until recently when she started having rectal bleeding and underwent rectal exam under anesthesia by Dr. Marcello Moores on 06/06/2021 which revealed an ulcerated mass involving the left side of the anal canal and perianal skin.  Biopsy showing invasive moderate to poorly differentiated squamous cell carcinoma.  Patient states she is aware of the biopsy results and is trying to make an appointment with Dr. Benay Spice. -Outpatient oncology follow-up  History of DVT and PE -Hold Xarelto until head CT is done, if negative for acute bleed, then resume Xarelto.  Hyperlipidemia -Resume statin after pharmacy med rec is done  Hypothyroidism -Resume Synthroid after pharmacy med rec is done  Chronic back pain On methadone and oxycodone-acetaminophen.  Likely also contributing to encephalopathy. -Pharmacy med rec pending to verify dose.  DVT prophylaxis: Hold Xarelto until head CT is  done. Code Status: Signed DNR form at bedside.  Spoke to the patient who confirms that she wishes to be DNR and DNI.  Family Communication: No family available at this time. Disposition Plan: Status is: Inpatient  Remains inpatient appropriate because:Inpatient level of care appropriate due to severity of illness  Dispo: The patient is from: Home              Anticipated d/c is to: Home              Patient currently is not medically stable to d/c.   Difficult to place patient No  Level of care: Level of care: Progressive  The medical decision making on this patient was of high complexity and the patient is at high risk for clinical deterioration, therefore this is a level 3 visit.  The medical decision making is of moderate complexity, therefore this is a level 2 visit.  Shela Leff MD [facility]  If 7PM-7AM, please contact night-coverage [email or web address]  06/19/2021, 5:39 AM

## 2021-06-19 NOTE — ED Notes (Signed)
Patient transported to CT 

## 2021-06-19 NOTE — Progress Notes (Signed)
Pt arrived to unit 2w24 from ED. Report received from [facility]. Pt A&O x 4, IV x 2, purewick in place, NS gtt @ 125 ml/hr. Pt oriented to unit and room. Call bell given to pt. Bed alarm turned on. Provider, [place], notified.

## 2021-06-19 NOTE — ED Notes (Signed)
Aniceto Boss, friend, [phone] would like to have her number on file in case pt would like to talk to her.

## 2021-06-19 NOTE — Progress Notes (Signed)
PROGRESS NOTE    MIRZA KIDNEY  NWG:[id number] DOB: 10/02/1946 DOA: 06/18/2021 PCP: Hoyt Koch, MD   Brief Narrative:   Joanne Klein is a 75 y.o. 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  Patient's niece reported that a neighbor came to check on the patient around 3 PM and noted that she was more drowsy but when the patient's daughter came to check on her around 8:30 PM she was found confused.  She is typically alert and oriented x3 at baseline.  Family felt that her extremities were cool to touch.  Upon EMS arrival, oxygen saturation was 79% on room air and she was placed on 10 L oxygen via nonrebreather.  Family also reported a productive cough.  Patient just recently underwent biopsy due to concern for recurrence of anal cancer which shows squamous cell characteristics - family reported that they met with hospice and signed a DNR.    Assessment & Plan:   Principal Problem:   Pneumonia Active Problems:   UTI (urinary tract infection)   Severe sepsis (HCC)   Acute hypoxemic respiratory failure (HCC)   Elevated troponin   Severe sepsis secondary to community - acquired pneumonia and UTI Patient met sepsis criteria given tachycardia, tachypnea, hypotensive, imaging shows diffuse infiltrates in the lungs consistent with pneumonia  Patient remains hypotensive today despite IV fluids, does not require pressors therefore she does not meet septic shock criteria but is severe sepsis  Continue  broad-spectrum antibiotics with vancomycin and cefepime, follow cultures IV fluids ongoing in setting of hypotension Tachycardia and tachypnea appear to be resolving but hypoxia is ongoing Continue to follow closely, patient is DNR, recently evaluated by hospice due to metastatic cancer diagnosis as below   Acute hypoxemic respiratory failure secondary to community-acquired pneumonia Secondary to above severe sepsis, improving somewhat with treatment today Continue antibiotics as above, wean oxygen as tolerated, incentive spirometry and flutter also offered today Hold off on steroids given this does not appear to be radiation pneumonitis per discussion with radiation oncology today given its location and diffuse pattern   Elevated troponin in the setting of above EKG negative, without chest pain  Likely supply demand mismatch in the setting of profound hypoxia and severe sepsis    AKI Secondary to above, prerenal and mild, continue IV fluids given hypotension as above   Acute metabolic encephalopathy, resolving Patient appears to be closer to baseline today, ANO x4 more appropriate discussion and history Cannot rule out polypharmacy given patient is on multiple narcotics, will attempt to wean these down until she improves clinically No further work-up at this time   Anal squamous cell carcinoma Status post chemo and radiation in 2020 and was in remission until recently when she started having rectal bleeding and underwent rectal exam under anesthesia by Dr. Marcello Moores on 06/06/2021 which revealed an ulcerated mass involving the left side of the anal canal and perianal skin.  Biopsy showing invasive moderate to poorly differentiated squamous cell carcinoma.  Patient states she is aware of the biopsy results and is trying to make an appointment with Dr. Benay Spice. -Outpatient oncology  follow-up   History of DVT and PE -Hold Xarelto until head CT is done, if negative for acute bleed, then resume  Xarelto.  Hyperlipidemia -Resume statin after pharmacy med rec is done  Hypothyroidism -Resume Synthroid after pharmacy med rec is done   Chronic back pain On methadone and oxycodone-acetaminophen. Likely also contributing to encephalopathy. -Pharmacy med rec pending to verify dose.   DVT prophylaxis: Hold Xarelto until head CT is done. Code Status: Signed DNR form at bedside.  Spoke to the patient who confirms that she wishes to be DNR and DNI. Family Communication: No family available at this time.  Status is: inpt  Dispo: The patient is from: home              Anticipated d/c is to: Home              Anticipated d/c date is: 48-72 h              Patient currently NOT medically stable for discharge  Consultants:  None  Procedures:  None  Antimicrobials:  Vanc/cefepime   Subjective: No acute issues/events overnight  Objective: Vitals:   06/19/21 1030 06/19/21 1130 06/19/21 1257 06/19/21 1530  BP: (!) 92/51 (!) 114/51 96/75 (!) 112/51  Pulse: 69 72 63 72  Resp: 12 16 20 13   Temp:   98.6 F (37 C) 97.9 F (36.6 C)  TempSrc:   Oral Oral  SpO2: 94% 94% 93% 97%  Weight:      Height:        Intake/Output Summary (Last 24 hours) at 06/19/2021 1718 Last data filed at 06/19/2021 1345 Gross per 24 hour  Intake 1223.33 ml  Output 200 ml  Net 1023.33 ml   Filed Weights   06/18/21 2205  Weight: 113.4 kg    Examination:  General exam: Appears calm and comfortable  Respiratory system: Bilateral rhonchi. Cardiovascular system: S1 & S2 heard, RRR. No JVD, murmurs, rubs, gallops or clicks. No pedal edema. Gastrointestinal system: Abdomen is nondistended, soft and nontender. No organomegaly or masses felt. Normal bowel sounds heard. Central nervous system: Alert and oriented. No focal neurological deficits. Extremities: Symmetric 5 x 5 power. Skin: No rashes, lesions or ulcers Psychiatry: Judgement and insight appear normal. Mood & affect appropriate.   Data  Reviewed: I have personally reviewed following labs and imaging studies  CBC: Recent Labs  Lab 06/18/21 2237 06/18/21 2341  WBC 12.7*  --   NEUTROABS 10.6*  --   HGB 14.0 13.3  HCT 42.6 39.0  MCV 95.5  --   PLT 149*  --    Basic Metabolic Panel: Recent Labs  Lab 06/18/21 2237 06/18/21 2341  NA 135 136  K 3.7 3.8  CL 99  --   CO2 24  --   GLUCOSE 120*  --   BUN 19  --   CREATININE 1.17*  --   CALCIUM 9.1  --    GFR: Estimated Creatinine Clearance: 54.7 mL/min (A) (by C-G formula based on SCr of 1.17 mg/dL (H)).  Liver Function Tests: Recent Labs  Lab 06/18/21 2237  AST 23  ALT 18  ALKPHOS 84  BILITOT 0.9  PROT 6.5  ALBUMIN 2.9*   No results for input(s): LIPASE, AMYLASE in the last 168 hours. No results for input(s): AMMONIA in the last 168 hours. Coagulation Profile: No results for input(s): INR, PROTIME in the last 168 hours. Cardiac Enzymes: No results for input(s): CKTOTAL, CKMB, CKMBINDEX, TROPONINI in the last 168  hours. BNP (last 3 results) No results for input(s): PROBNP in the last 8760 hours. HbA1C: No results for input(s): HGBA1C in the last 72 hours. CBG: Recent Labs  Lab 06/19/21 1615  GLUCAP 87   Lipid Profile: No results for input(s): CHOL, HDL, LDLCALC, TRIG, CHOLHDL, LDLDIRECT in the last 72 hours. Thyroid Function Tests: No results for input(s): TSH, T4TOTAL, FREET4, T3FREE, THYROIDAB in the last 72 hours. Anemia Panel: No results for input(s): VITAMINB12, FOLATE, FERRITIN, TIBC, IRON, RETICCTPCT in the last 72 hours. Sepsis Labs: Recent Labs  Lab 06/18/21 0012 06/19/21 0247  LATICACIDVEN 2.5* 1.4    Recent Results (from the past 240 hour(s))  Resp Panel by RT-PCR (Flu A&B, Covid) Nasopharyngeal Swab     Status: None   Collection Time: 06/18/21 11:18 PM   Specimen: Nasopharyngeal Swab; Nasopharyngeal(NP) swabs in vial transport medium  Result Value Ref Range Status   SARS Coronavirus 2 by RT PCR NEGATIVE NEGATIVE Final     Comment: (NOTE) SARS-CoV-2 target nucleic acids are NOT DETECTED.  The SARS-CoV-2 RNA is generally detectable in upper respiratory specimens during the acute phase of infection. The lowest concentration of SARS-CoV-2 viral copies this assay can detect is 138 copies/mL. A negative result does not preclude SARS-Cov-2 infection and should not be used as the sole basis for treatment or other patient management decisions. A negative result may occur with  improper specimen collection/handling, submission of specimen other than nasopharyngeal swab, presence of viral mutation(s) within the areas targeted by this assay, and inadequate number of viral copies(<138 copies/mL). A negative result must be combined with clinical observations, patient history, and epidemiological information. The expected result is Negative.  Fact Sheet for Patients:  EntrepreneurPulse.com.au  Fact Sheet for Healthcare Providers:  IncredibleEmployment.be  This test is no t yet approved or cleared by the [place] FDA and  has been authorized for detection and/or diagnosis of SARS-CoV-2 by FDA under an Emergency Use Authorization (EUA). This EUA will remain  in effect (meaning this test can be used) for the duration of the COVID-19 declaration under Section 564(b)(1) of the Act, 21 U.S.C.section 360bbb-3(b)(1), unless the authorization is terminated  or revoked sooner.       Influenza A by PCR NEGATIVE NEGATIVE Final   Influenza B by PCR NEGATIVE NEGATIVE Final    Comment: (NOTE) The Xpert Xpress SARS-CoV-2/FLU/RSV plus assay is intended as an aid in the diagnosis of influenza from Nasopharyngeal swab specimens and should not be used as a sole basis for treatment. Nasal washings and aspirates are unacceptable for Xpert Xpress SARS-CoV-2/FLU/RSV testing.  Fact Sheet for Patients: EntrepreneurPulse.com.au  Fact Sheet for Healthcare  Providers: IncredibleEmployment.be  This test is not yet approved or cleared by the [place] FDA and has been authorized for detection and/or diagnosis of SARS-CoV-2 by FDA under an Emergency Use Authorization (EUA). This EUA will remain in effect (meaning this test can be used) for the duration of the COVID-19 declaration under Section 564(b)(1) of the Act, 21 U.S.C. section 360bbb-3(b)(1), unless the authorization is terminated or revoked.  Performed at [facility], [place] [address] [place], [place] [address]   MRSA Next Gen by PCR, Nasal     Status: None   Collection Time: 06/19/21  5:37 AM   Specimen: Nasal Mucosa; Nasal Swab  Result Value Ref Range Status   MRSA by PCR Next Gen NOT DETECTED NOT DETECTED Final    Comment: (NOTE) The GeneXpert MRSA Assay (FDA approved for NASAL specimens only),  is one component of a comprehensive MRSA colonization surveillance program. It is not intended to diagnose MRSA infection nor to guide or monitor treatment for MRSA infections. Test performance is not FDA approved in patients less than 34 years old. Performed at [facility], [place] [address] [place], [place] [address]      Radiology Studies: DG Chest Port 1 View  Result Date: 06/18/2021 CLINICAL DATA:  Dyspnea, altered mental status EXAM: PORTABLE CHEST 1 VIEW COMPARISON:  09/27/2020 FINDINGS: Lung volumes are small. Left basilar focal pulmonary infiltrate has developed, possibly infectious in the appropriate clinical setting. No pneumothorax. Tiny left pleural effusion. Cardiac size within normal limits. No acute bone abnormality. IMPRESSION: Left basilar focal pulmonary infiltrate, possibly infectious, with associated tiny left parapneumonic effusion. Electronically Signed   By: Fidela Salisbury MD   On: 06/18/2021 22:59    Scheduled Meds:  methadone  5 mg Oral Q6H   Continuous Infusions:  sodium chloride 125 mL/hr at 06/19/21 1436   ceFEPime  (MAXIPIME) IV Stopped (06/19/21 1303)   vancomycin       LOS: 0 days   Time spent: 88mn  Jeronica Stlouis C Aayliah Rotenberry, DO [facility]  If 7PM-7AM, please contact night-coverage [email or web address]  06/19/2021, 5:18 PM

## 2021-06-19 NOTE — Plan of Care (Signed)

## 2021-06-19 NOTE — Progress Notes (Signed)
Pharmacy Antibiotic Note  Joanne Klein is a 75 y.o. female admitted on 06/18/2021 with sepsis.  Pharmacy has been consulted for Vancomycin and Cefepime dosing.  Plan: Cefepime 2gm IV q12h Vancomycin 1000 mg IV Q 24 hrs. Goal AUC 400-550. Expected AUC: 468 SCr used: 1.17 Will f/u renal function, micro data, and pt's clinical condition Vanc levels prn   Height: 5\' 6"  (167.6 cm) Weight: 113.4 kg (250 lb) IBW/kg (Calculated) : 59.3  Temp (24hrs), Avg:98.9 F (37.2 C), Min:98.7 F (37.1 C), Max:99.1 F (37.3 C)  Recent Labs  Lab 06/18/21 0012 06/18/21 2237 06/19/21 0247  WBC  --  12.7*  --   CREATININE  --  1.17*  --   LATICACIDVEN 2.5*  --  1.4    Estimated Creatinine Clearance: 54.7 mL/min (A) (by C-G formula based on SCr of 1.17 mg/dL (H)).    Allergies  Allergen Reactions   Prozac [Fluoxetine Hcl] Anaphylaxis   Codeine Itching   Morphine And Related Itching   Sulfa Antibiotics Itching and Nausea Only    Antimicrobials this admission: 6/29 Cefepime >>  6/29 Vancomycin >>   Microbiology results: 6/29 BCx:  6/27 UCx:    Thank you for allowing pharmacy to be a part of this patient's care.  Sherlon Handing, PharmD, BCPS Please see amion for complete clinical pharmacist phone list 06/19/2021 5:37 AM

## 2021-06-20 ENCOUNTER — Encounter: Payer: Self-pay | Admitting: Oncology

## 2021-06-20 LAB — BASIC METABOLIC PANEL
Anion gap: 7 (ref 5–15)
BUN: 23 mg/dL (ref 8–23)
CO2: 29 mmol/L (ref 22–32)
Calcium: 9 mg/dL (ref 8.9–10.3)
Chloride: 104 mmol/L (ref 98–111)
Creatinine, Ser: 0.98 mg/dL (ref 0.44–1.00)
GFR, Estimated: >60 mL/min (ref >60)
Glucose, Bld: 81 mg/dL (ref 70–99)
Potassium: 4 mmol/L (ref 3.5–5.1)
Sodium: 140 mmol/L (ref 135–145)

## 2021-06-20 LAB — CBC
HCT: 32.5 % — ABNORMAL LOW (ref 36.0–46.0)
Hemoglobin: 10.7 g/dL — ABNORMAL LOW (ref 12.0–15.0)
MCH: 31.8 pg (ref 26.0–34.0)
MCHC: 32.9 g/dL (ref 30.0–36.0)
MCV: 96.7 fL (ref 80.0–100.0)
Platelets: ADEQUATE 10*3/uL (ref 150–400)
RBC: 3.36 MIL/uL — ABNORMAL LOW (ref 3.87–5.11)
RDW: 14.2 % (ref 11.5–15.5)
WBC: 6.3 10*3/uL (ref 4.0–10.5)
nRBC: 0 % (ref 0.0–0.2)

## 2021-06-20 LAB — URINE CULTURE

## 2021-06-20 MED ORDER — POLYETHYLENE GLYCOL 3350 17 G PO PACK
17.0000 g | PACK | Freq: Two times a day (BID) | ORAL | Status: DC
  Administered 2021-06-20 – 2021-06-23 (×4): 17 g via ORAL
  Filled 2021-06-20 (×7): qty 1

## 2021-06-20 MED ORDER — RIVAROXABAN 20 MG PO TABS
20.0000 mg | ORAL_TABLET | Freq: Every day | ORAL | Status: DC
  Administered 2021-06-20 – 2021-06-24 (×5): 20 mg via ORAL
  Filled 2021-06-20 (×5): qty 1

## 2021-06-20 NOTE — TOC Initial Note (Signed)
Transition of Care [facility]) - Initial/Assessment Note    Patient Details  Name: Joanne Klein MRN: [id number] Date of Birth: July 25, 1946  Transition of [facility]) CM/SW Contact:    Verdell Carmine, RN Phone Number: 06/20/2021, 9:47 AM  Clinical Narrative:                 Patient with anal cancer, in with weakness SHOB. On 8LPM high flow. Lives at home, hospice consulted, has niece. Reoccurrence of cancer.  Will follow for any DME needs consult with hospice.   Expected Discharge Plan: Home w Hospice Care Barriers to Discharge: Continued Medical Work up   Patient Goals and CMS Choice        Expected Discharge Plan and Services Expected Discharge Plan: [place] Acute Care Choice: Hospice                                        Prior Living Arrangements/Services     Patient language and need for interpreter reviewed:: Yes        Need for Family Participation in Patient Care: Yes (Comment) Care giver support system in place?: Yes (comment)   Criminal Activity/Legal Involvement Pertinent to Current Situation/Hospitalization: No - Comment as needed  Activities of Daily Living Home Assistive Devices/Equipment: [facility] Hospital bed, Walker (specify type), Trapeze, Wheelchair ADL Screening (condition at time of admission) Patient's cognitive ability adequate to safely complete daily activities?: Yes Is the patient deaf or have difficulty hearing?: No Does the patient have difficulty seeing, even when wearing glasses/contacts?: No Does the patient have difficulty concentrating, remembering, or making decisions?: No Patient able to express need for assistance with ADLs?: Yes Does the patient have difficulty dressing or bathing?: Yes Independently performs ADLs?: No Communication: Independent Dressing (OT): Needs assistance Is this a change from baseline?: Pre-admission baseline Grooming: Needs assistance Is this a change from baseline?:  Pre-admission baseline Feeding: Independent Bathing: Needs assistance Is this a change from baseline?: Pre-admission baseline Toileting: Needs assistance Is this a change from baseline?: Pre-admission baseline In/Out Bed: Needs assistance Is this a change from baseline?: Pre-admission baseline Walks in Home: Dependent Is this a change from baseline?: Pre-admission baseline Does the patient have difficulty walking or climbing stairs?: Yes Weakness of Legs: Both Weakness of Arms/Hands: None  Permission Sought/Granted                  Emotional Assessment       Orientation: : Oriented to Self, Oriented to Place, Oriented to  Time, Oriented to Situation Alcohol / Substance Use: Not Applicable Psych Involvement: No (comment)  Admission diagnosis:  Pneumonia [J18.9] Pneumonia of left lower lobe due to infectious organism [J18.9] Sepsis with acute hypoxic respiratory failure without septic shock, due to unspecified organism ([place]) [A41.9, R65.20, J96.01] Patient Active Problem List   Diagnosis Date Noted   Pneumonia 06/19/2021   UTI (urinary tract infection) 06/19/2021   Severe sepsis ([place]) 06/19/2021   Acute hypoxemic respiratory failure ([place]) 06/19/2021   Elevated troponin 06/19/2021   Gait abnormality 10/25/2020   Vitamin B12 deficiency 10/25/2020   Heme positive stool    Acute blood loss anemia    History of anal cancer    History of pulmonary embolism    Pulmonary embolism ([place]) 09/27/2020   Pain    Lumbar radiculopathy    Weakness of both legs 06/19/2020  Falls frequently 06/19/2020   Weakness 06/19/2020   Chronic respiratory failure with hypoxia (HCC) 12/15/2019   Orthostatic hypotension 12/15/2019   Numbness and tingling 12/15/2019   Chemotherapy-induced neutropenia (HCC)    Thrombocytopenia ([place]) 10/17/2019   Anal cancer ([place]) 09/15/2019   Routine general medical examination at a health care facility 11/19/2016   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   Morbid obesity  ([place]) 06/26/2014   COPD GOLD II     Hyperlipidemia    Arthritis 01/16/2014   Depression 01/13/2014   Hypothyroidism 01/13/2014   GERD (gastroesophageal reflux disease) 01/13/2014   OCD (obsessive compulsive disorder) 01/13/2014   Fibromyalgia 01/13/2014   PCP:  Hoyt Koch, MD Pharmacy:   [facility] [place], [place] [address] Phone: [phone] Fax: [phone]  [facility]  ([place], [place] [place] [address] Phone: [phone] Fax: [phone]     Social Determinants of Health ([place]) Interventions    Readmission Risk Interventions Readmission Risk Prevention Plan 06/22/2020  Transportation Screening Complete  PCP or Specialist Appt within 5-7 Days Not Complete  Not Complete comments disposition pending  Home Care Screening Complete  Medication Review (RN CM) Referral to Pharmacy  Some recent data might be hidden

## 2021-06-20 NOTE — Progress Notes (Signed)
PROGRESS NOTE    Joanne Klein  XBL:[id number] DOB: [birth year] DOA: 06/18/2021 PCP: Hoyt Koch, MD   Brief Narrative:   Joanne Klein is a 75 y.o. 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  Patient's niece reported that a neighbor came to check on the patient around 3 PM and noted that she was more drowsy but when the patient's daughter came to check on her around 8:30 PM she was found confused.  She is typically alert and oriented x3 at baseline.  Family felt that her extremities were cool to touch.  Upon EMS arrival, oxygen saturation was 79% on room air and she was placed on 10 L oxygen via nonrebreather.  Family also reported a productive cough.  Patient just recently underwent biopsy due to concern for recurrence of anal cancer which shows squamous cell characteristics - family reported that they met with hospice and signed a DNR.    Assessment & Plan:   Principal Problem:   Pneumonia Active Problems:   UTI (urinary tract infection)   Severe sepsis (HCC)   Acute hypoxemic respiratory failure (HCC)   Elevated troponin  Severe sepsis secondary to community - acquired pneumonia and UTI Patient met sepsis criteria given tachycardia, tachypnea, hypotensive, imaging shows diffuse infiltrates in the lungs consistent with pneumonia  Blood pressure minimally improving with IV fluids, encourage p.o. intake as tolerated. Continue broad-spectrum antibiotics with vancomycin and cefepime, follow  cultures. Continue to follow closely, patient is DNR, recently evaluated by hospice due to metastatic cancer diagnosis as below.   Acute hypoxemic respiratory failure secondary to community-acquired pneumonia/sepsis Secondary to above severe sepsis, improving somewhat with treatment today Continue antibiotics as above, wean oxygen as tolerated, incentive spirometry and flutter also offered today. Hold off on steroids given this does not appear to be radiation pneumonitis per discussion with radiation oncology given its location and diffuse pattern.   Elevated troponin in the setting of above EKG negative, without chest pain. Likely supply demand mismatch in the setting of profound hypoxia and severe sepsis.   AKI, resolving Secondary to above, prerenal and mild, continue IV fluids given hypotension as above  Lab Results  Component Value Date   CREATININE 0.98 06/20/2021   CREATININE 1.17 (H) 06/18/2021   CREATININE 0.78 30/06/6225   Acute metabolic encephalopathy, resolving Patient appears to be closer to baseline today, ANO x4 more appropriate discussion and history Cannot rule out polypharmacy given patient is on multiple narcotics, will attempt to wean these down until she improves clinically No further work-up at this time   Anal squamous cell carcinoma Status post chemo and radiation in 2020 and was in remission until recently when she started having rectal bleeding and underwent rectal exam under anesthesia by Dr. Marcello Moores on 06/06/2021 which revealed an ulcerated mass involving the left side of the anal canal and perianal skin.  Biopsy showing invasive moderate to poorly differentiated squamous cell carcinoma.  Patient states she is aware of the biopsy results and is trying to make an appointment with Dr. Benay Spice. -Outpatient oncology follow-up.   History of DVT and PE - Resume  Xarelto  Hyperlipidemia - Resume statin after pharmacy med rec is done  Hypothyroidism - Resume  Synthroid after pharmacy med rec is done   Chronic back pain - On methadone and oxycodone-acetaminophen   DVT prophylaxis: Resume Xarelto Code Status: DNR - confirmed w/ niece Family Communication: Niece Andi over the phone.  Status is: Inpt  Dispo: The patient is from: Home             Anticipated d/c is to: Home             Anticipated d/c date is: 72+h             Patient currently NOT medically stable for discharge  Consultants:  None  Procedures:  None  Antimicrobials:  Vanc/cefepime   Subjective: No acute issues/events overnight  Objective: Vitals:   06/19/21 1914 06/19/21 2339 06/20/21 0346 06/20/21 0729  BP: 103/64 (!) 96/56 (!) 107/59 (!) 157/78  Pulse: 72 60 65 79  Resp: (!) 9 18 11 15   Temp: 98.1 F (36.7 C) 98.2 F (36.8 C) 98.6 F (37 C) 98.1 F (36.7 C)  TempSrc: Oral Axillary Axillary Oral  SpO2: 96% 97% 100% 100%  Weight:      Height:        Intake/Output Summary (Last 24 hours) at 06/20/2021 0809 Last data filed at 06/20/2021 0700 Gross per 24 hour  Intake 2270.97 ml  Output 375 ml  Net 1895.97 ml    Filed Weights   06/18/21 2205  Weight: 113.4 kg    Examination:  General exam: Appears calm and comfortable, somnolent but arousable, answers simple questions appropriately but orientation is somewhat limited Respiratory system: Bilateral rhonchi. Cardiovascular system: S1 & S2 heard, RRR. No JVD, murmurs, rubs, gallops or clicks. No pedal edema. Gastrointestinal system: Abdomen is nondistended, soft and nontender. No organomegaly or masses felt. Normal bowel sounds heard. Central nervous system: Alert and oriented. No focal neurological deficits. Extremities: Symmetric 5 x 5 power. Skin: No rashes, lesions or ulcers Psychiatry: Judgement and insight appear normal. Mood & affect appropriate.   Data Reviewed: I have personally reviewed following labs and imaging studies  CBC: Recent Labs  Lab 06/18/21 2237 06/18/21 2341  06/20/21 0349  WBC 12.7*  --  6.3  NEUTROABS 10.6*  --   --   HGB 14.0 13.3 10.7*  HCT 42.6 39.0 32.5*  MCV 95.5  --  96.7  PLT 149*  --  PLATELET CLUMPS NOTED ON SMEAR, COUNT APPEARS ADEQUATE    Basic Metabolic Panel: Recent Labs  Lab 06/18/21 2237 06/18/21 2341 06/20/21 0349  NA 135 136 140  K 3.7 3.8 4.0  CL 99  --  104  CO2 24  --  29  GLUCOSE 120*  --  81  BUN 19  --  23  CREATININE 1.17*  --  0.98  CALCIUM 9.1  --  9.0    GFR: Estimated Creatinine Clearance: 65.3 mL/min (by C-G formula based on SCr of 0.98 mg/dL).  Liver Function Tests: Recent Labs  Lab 06/18/21 2237  AST 23  ALT 18  ALKPHOS 84  BILITOT 0.9  PROT 6.5  ALBUMIN 2.9*    No results for input(s): LIPASE, AMYLASE in the last 168 hours. No results for input(s): AMMONIA in the last 168 hours. Coagulation Profile: No results for input(s): INR, PROTIME in the last 168 hours. Cardiac Enzymes: No results for input(s): CKTOTAL, CKMB, CKMBINDEX, TROPONINI in the last 168 hours. BNP (last 3 results) No results for input(s):  PROBNP in the last 8760 hours. HbA1C: No results for input(s): HGBA1C in the last 72 hours. CBG: Recent Labs  Lab 06/19/21 1615  GLUCAP 87    Lipid Profile: No results for input(s): CHOL, HDL, LDLCALC, TRIG, CHOLHDL, LDLDIRECT in the last 72 hours. Thyroid Function Tests: No results for input(s): TSH, T4TOTAL, FREET4, T3FREE, THYROIDAB in the last 72 hours. Anemia Panel: No results for input(s): VITAMINB12, FOLATE, FERRITIN, TIBC, IRON, RETICCTPCT in the last 72 hours. Sepsis Labs: Recent Labs  Lab 06/18/21 0012 06/19/21 0247  LATICACIDVEN 2.5* 1.4     Recent Results (from the past 240 hour(s))  Urine culture     Status: Abnormal   Collection Time: 06/17/21  2:29 AM   Specimen: Urine, Random  Result Value Ref Range Status   Specimen Description URINE, RANDOM  Final   Special Requests   Final    Immunocompromised Performed at [facility], [place]  [address], [place], [place] [address]    Culture MULTIPLE SPECIES PRESENT, SUGGEST RECOLLECTION (A)  Final   Report Status 06/20/2021 FINAL  Final  Resp Panel by RT-PCR (Flu A&B, Covid) Nasopharyngeal Swab     Status: None   Collection Time: 06/18/21 11:18 PM   Specimen: Nasopharyngeal Swab; Nasopharyngeal(NP) swabs in vial transport medium  Result Value Ref Range Status   SARS Coronavirus 2 by RT PCR NEGATIVE NEGATIVE Final    Comment: (NOTE) SARS-CoV-2 target nucleic acids are NOT DETECTED.  The SARS-CoV-2 RNA is generally detectable in upper respiratory specimens during the acute phase of infection. The lowest concentration of SARS-CoV-2 viral copies this assay can detect is 138 copies/mL. A negative result does not preclude SARS-Cov-2 infection and should not be used as the sole basis for treatment or other patient management decisions. A negative result may occur with  improper specimen collection/handling, submission of specimen other than nasopharyngeal swab, presence of viral mutation(s) within the areas targeted by this assay, and inadequate number of viral copies(<138 copies/mL). A negative result must be combined with clinical observations, patient history, and epidemiological information. The expected result is Negative.  Fact Sheet for Patients:  EntrepreneurPulse.com.au  Fact Sheet for Healthcare Providers:  IncredibleEmployment.be  This test is no t yet approved or cleared by the [place] FDA and  has been authorized for detection and/or diagnosis of SARS-CoV-2 by FDA under an Emergency Use Authorization (EUA). This EUA will remain  in effect (meaning this test can be used) for the duration of the COVID-19 declaration under Section 564(b)(1) of the Act, 21 U.S.C.section 360bbb-3(b)(1), unless the authorization is terminated  or revoked sooner.       Influenza A by PCR NEGATIVE NEGATIVE Final   Influenza B by PCR NEGATIVE  NEGATIVE Final    Comment: (NOTE) The Xpert Xpress SARS-CoV-2/FLU/RSV plus assay is intended as an aid in the diagnosis of influenza from Nasopharyngeal swab specimens and should not be used as a sole basis for treatment. Nasal washings and aspirates are unacceptable for Xpert Xpress SARS-CoV-2/FLU/RSV testing.  Fact Sheet for Patients: EntrepreneurPulse.com.au  Fact Sheet for Healthcare Providers: IncredibleEmployment.be  This test is not yet approved or cleared by the [place] FDA and has been authorized for detection and/or diagnosis of SARS-CoV-2 by FDA under an Emergency Use Authorization (EUA). This EUA will remain in effect (meaning this test can be used) for the duration of the COVID-19 declaration under Section 564(b)(1) of the Act, 21 U.S.C. section 360bbb-3(b)(1), unless the authorization is terminated or revoked.  Performed at  [facility], [place] [address], [place], [place] [address]   Blood culture (routine x 2)     Status: None (Preliminary result)   Collection Time: 06/19/21 12:01 AM   Specimen: BLOOD  Result Value Ref Range Status   Specimen Description BLOOD RIGHT ANTECUBITAL  Final   Special Requests   Final    BOTTLES DRAWN AEROBIC AND ANAEROBIC Blood Culture adequate volume   Culture   Final    NO GROWTH 1 DAY Performed at [facility], [place] [address] [place], [place] [address]    Report Status PENDING  Incomplete  MRSA Next Gen by PCR, Nasal     Status: None   Collection Time: 06/19/21  5:37 AM   Specimen: Nasal Mucosa; Nasal Swab  Result Value Ref Range Status   MRSA by PCR Next Gen NOT DETECTED NOT DETECTED Final    Comment: (NOTE) The GeneXpert MRSA Assay (FDA approved for NASAL specimens only), is one component of a comprehensive MRSA colonization surveillance program. It is not intended to diagnose MRSA infection nor to guide or monitor treatment for MRSA infections. Test performance is not  FDA approved in patients less than 70 years old. Performed at [facility], [place] [address], [place], [place] [address]   Blood culture (routine x 2)     Status: None (Preliminary result)   Collection Time: 06/19/21  6:57 AM   Specimen: BLOOD  Result Value Ref Range Status   Specimen Description BLOOD SITE NOT SPECIFIED  Final   Special Requests   Final    BOTTLES DRAWN AEROBIC AND ANAEROBIC Blood Culture results may not be optimal due to an inadequate volume of blood received in culture bottles   Culture   Final    NO GROWTH < 24 HOURS Performed at [facility], [place] [address], [place], [place] [address]    Report Status PENDING  Incomplete      Radiology Studies: DG Chest Port 1 View  Result Date: 06/18/2021 CLINICAL DATA:  Dyspnea, altered mental status EXAM: PORTABLE CHEST 1 VIEW COMPARISON:  09/27/2020 FINDINGS: Lung volumes are small. Left basilar focal pulmonary infiltrate has developed, possibly infectious in the appropriate clinical setting. No pneumothorax. Tiny left pleural effusion. Cardiac size within normal limits. No acute bone abnormality. IMPRESSION: Left basilar focal pulmonary infiltrate, possibly infectious, with associated tiny left parapneumonic effusion. Electronically Signed   By: Fidela Salisbury MD   On: 06/18/2021 22:59    Scheduled Meds:  methadone  5 mg Oral Q6H   Continuous Infusions:  ceFEPime (MAXIPIME) IV 2 g (06/20/21 0032)     LOS: 1 day   Time spent: 72mn  Sheyna Pettibone C Collen Vincent, DO [facility]  If 7PM-7AM, please contact night-coverage [email or web address]  06/20/2021, 8:09 AM

## 2021-06-20 NOTE — Progress Notes (Signed)
[facility] - [facility]) hospitalized hospice patient.   This patient is a current [facility] hospice patient with a terminal Diagnosis of anal cancer. Hospice nurse attempted to visit today and patient was not home. Contacted family member who reported patient was transported to ED for evaluation. Per ED notes patient had been more drowsy and confused. EMS was activated and patient was transported to [facility] ED. Patient was admitted to [facility] on 6.29.2022 with a diagnosis of pneumonia and sepsis. Per Dr. Tomasa Hosteller with [facility] this is a related hospital admission.   Visited patient at bedside. No family present. She is alert but she states she more confused today. States she is hungry and asked for assistance calling her friend.  Report from RN, patient answers questions appropriately but has been hallucinating people in her room that are not there.    This patient is appropriate for appropriate for inpatient services due to treatment  pneumonia and sepsis.  VS: 98.1,157/78,79,15,100% 5L HFNC I/O: 2271/1896  Abnormal Labs: 06/20/2021 03:49 RBC: 3.36 (L) Hemoglobin: 10.7 (L) HCT: 32.5 (L)  No new imaging  IV/PRN Medications: Maxipime 2g IVPB BID No PRNs given.  Problem list:  Principal Problem:   Pneumonia Active Problems:   UTI (urinary tract infection)   Severe sepsis ([place])   Acute hypoxemic respiratory failure (HCC)   Elevated troponin  No new physician notes at this time.   Discharge Planning: Return home with hospice services when stable for discharge.   Family Contact: Spoke with niece, Bernadette Hoit and updated her.   IDG: Updated   Goals of Care: Clear. Treat for current infection and maintain comfort. DNR  Medication lists and transfer summary placed on shadow chart.    Should patient need ambulance transport at discharge please use [facility].   A Please do not hesitate to call with questions.   Thank you,   Farrel Gordon, RN, [facility] Liaison [phone]

## 2021-06-20 NOTE — Plan of Care (Signed)

## 2021-06-20 NOTE — Consult Note (Signed)
[place] Nurse Consult Note: Reason for Consult: Wound at anus, chronic, nonhealing Wound type:Neoplastic vs nonhealing biopsy site Pressure Injury POA: N/A Measurement: 2.6cm x 1.6cm with depth unable to be determined due to the presence of nonviable slough vs fibrin secondary to inflammation Wound bed: 95% light yellow with 5% friable edge with scant bleeding Drainage (amount, consistency, odor) scant serosanguinous on old dressing Periwound: mild erythema, induration, no warmth Dressing procedure/placement/frequency: This is a known neoplastic area with biopsy in area, patient is on Hospice Service and will return home post stabilization in house. I have provided Nursing with guidance via the orders for twice daily care of this area aimed at patient comfort and atraumatic dressing changes using size appropriate piece of xeroform gauze. Care includes cleansing twice daily and turning and repositioning per house protocol.  Heel floatation for pressure injury prevention will be via bilateral Prevalon Boots.  [place] nursing team will not follow, but will remain available to this patient, the nursing and medical teams.  Please re-consult if needed. Thanks, Maudie Flakes, MSN, RN, [place], Arther Abbott  Pager# [phone]

## 2021-06-21 ENCOUNTER — Encounter: Payer: Self-pay | Admitting: Oncology

## 2021-06-21 DIAGNOSIS — Z85048 Personal history of other malignant neoplasm of rectum, rectosigmoid junction, and anus: Secondary | ICD-10-CM

## 2021-06-21 LAB — CBC
HCT: 30.6 % — ABNORMAL LOW (ref 36.0–46.0)
Hemoglobin: 10.1 g/dL — ABNORMAL LOW (ref 12.0–15.0)
MCH: 31.3 pg (ref 26.0–34.0)
MCHC: 33 g/dL (ref 30.0–36.0)
MCV: 94.7 fL (ref 80.0–100.0)
Platelets: INCREASED 10*3/uL (ref 150–400)
RBC: 3.23 MIL/uL — ABNORMAL LOW (ref 3.87–5.11)
RDW: 13.6 % (ref 11.5–15.5)
WBC: 5.1 10*3/uL (ref 4.0–10.5)
nRBC: 0 % (ref 0.0–0.2)

## 2021-06-21 LAB — BASIC METABOLIC PANEL
Anion gap: 6 (ref 5–15)
BUN: 19 mg/dL (ref 8–23)
CO2: 28 mmol/L (ref 22–32)
Calcium: 8.6 mg/dL — ABNORMAL LOW (ref 8.9–10.3)
Chloride: 106 mmol/L (ref 98–111)
Creatinine, Ser: 0.64 mg/dL (ref 0.44–1.00)
GFR, Estimated: >60 mL/min (ref >60)
Glucose, Bld: 113 mg/dL — ABNORMAL HIGH (ref 70–99)
Potassium: 3.2 mmol/L — ABNORMAL LOW (ref 3.5–5.1)
Sodium: 140 mmol/L (ref 135–145)

## 2021-06-21 LAB — URINE CULTURE: Culture: NO GROWTH

## 2021-06-21 MED ORDER — METHADONE HCL 10 MG PO TABS: 5.0000 mg | ORAL_TABLET | Freq: Four times a day (QID) | ORAL | Status: DC | PRN

## 2021-06-21 MED ORDER — LEVOTHYROXINE SODIUM 75 MCG PO TABS
75.0000 ug | ORAL_TABLET | Freq: Every day | ORAL | Status: DC
  Administered 2021-06-21 – 2021-06-25 (×5): 75 ug via ORAL
  Filled 2021-06-21 (×5): qty 1

## 2021-06-21 MED ORDER — OXYCODONE-ACETAMINOPHEN 5-325 MG PO TABS
1.0000 | ORAL_TABLET | Freq: Four times a day (QID) | ORAL | Status: DC | PRN
Start: 2021-06-21 — End: 2021-06-25
  Administered 2021-06-22 – 2021-06-25 (×11): 2 via ORAL
  Filled 2021-06-21 (×3): qty 2
  Filled 2021-06-21: qty 1
  Filled 2021-06-21 (×6): qty 2
  Filled 2021-06-21 (×2): qty 1
  Filled 2021-06-21: qty 2

## 2021-06-21 MED ORDER — ONDANSETRON HCL 4 MG PO TABS
4.0000 mg | ORAL_TABLET | Freq: Four times a day (QID) | ORAL | Status: DC | PRN
  Administered 2021-06-22 – 2021-06-25 (×8): 4 mg via ORAL
  Filled 2021-06-21 (×8): qty 1

## 2021-06-21 MED ORDER — ATORVASTATIN CALCIUM 10 MG PO TABS
20.0000 mg | ORAL_TABLET | ORAL | Status: DC
  Administered 2021-06-23 – 2021-06-25 (×2): 20 mg via ORAL
  Filled 2021-06-21 (×3): qty 2

## 2021-06-21 MED ORDER — ONDANSETRON HCL 4 MG/2ML IJ SOLN
4.0000 mg | Freq: Four times a day (QID) | INTRAMUSCULAR | Status: DC | PRN
  Administered 2021-06-21 (×2): 4 mg via INTRAVENOUS
  Filled 2021-06-21 (×3): qty 2

## 2021-06-21 NOTE — Progress Notes (Addendum)
[facility] - [facility]) hospitalized hospice patient.   This patient is a current [facility] hospice patient with a terminal Diagnosis of anal cancer. Hospice nurse attempted to visit today and patient was not home. Contacted family member who reported patient was transported to ED for evaluation. Per ED notes patient had been more drowsy and confused. EMS was activated and patient was transported to [facility] ED. Patient was admitted to [facility] on 6.29.2022 with a diagnosis of pneumonia and sepsis. Per Dr. Tomasa Hosteller with [facility] this is a related hospital admission.   Visited patient at bedside. No family present. Patient is awake and alert. She is sitting partially up in bed. Complains of nausea but states she has called her nurse. She seems more oriented today. Reports family visiting last night and this morning. States she wants to return home. Report received from RN.    This patient is appropriate for appropriate for inpatient services due to treatment  pneumonia and sepsis.   VS: 98.9, 161/94, 86, 13, 93% 2Lnc I/O: 1180/550   Abnormal Labs: 06/21/2021 02:22 Glucose: 113 (H) Calcium: 8.6 (L) RBC: 3.23 (L) Hemoglobin: 10.1 (L) HCT: 30.6 (L)  No new imaging   IV/PRN Medications: Maxipime 2g IVPB BID Zofran 4mg  IV PRN @ 1348   Problem list:  Principal Problem:   Pneumonia Active Problems:   UTI (urinary tract infection)   Severe sepsis (HCC)   Acute hypoxemic respiratory failure (HCC)   Elevated troponin   Per MD notes today: GOALS OF CARE -Lengthy discussion with daughter yesterday, patient already following with hospice, at this time we will continue aggressive care for her likely to me acquired pneumonia and UTI sepsis but should she worsen we have discussed transitioning to palliative care. -Patient thankfully more awake and alert this morning as we wean down her narcotics, she remains optimistic about being discharged home but we discussed that given her baseline bedbound  status with no care at home other than occasional neighbor assistance this will likely not be an ideal discharge location.  We discussed involving hospice care to discuss assistance programs at home versus transitioning to hospice house. Patient was less inclined to transition to hospice house unless absolutely necessary. Severe sepsis secondary to community - acquired pneumonia and UTI, resolving - Continue broad-spectrum antibiotics with cefepime Acute hypoxemic respiratory failure secondary to community-acquired pneumonia/sepsis - Continue antibiotics as above, wean oxygen as tolerated, incentive spirometry and flutter also offered today. Acute metabolic encephalopathy, resolving Anal squamous cell carcinoma   Discharge Planning: Return home with hospice services when stable for discharge vs transfer to [facility]. Not clear she is IPU appropriate at this time.    Family Contact: Spoke with niece, Bernadette Hoit and updated her.   IDG: Updated   Goals of Care: Clear. Treat for current infection and maintain comfort. DNR   Medication lists and transfer summary placed on shadow chart.    Should patient need ambulance transport at discharge please use [facility].   A Please do not hesitate to call with questions.   Thank you,   Farrel Gordon, RN, [facility] Liaison [phone]

## 2021-06-21 NOTE — Progress Notes (Addendum)
HEMATOLOGY-ONCOLOGY PROGRESS NOTE  SUBJECTIVE: Ms. Naeve is known to our service.  She has a history of anal cancer.  She was found to have local recurrence.  Has not been seen in our office since January 2022.  The patient recently enrolled in hospice.  She is bedbound.  Has significant lower extremity weakness.  The patient reports pain today.  She tells me that the pain does not in her legs but that the pain is "weird."  She cannot further elaborate.  She tells me that she wants to go home with the support of hospice.  She anticipates that her neighbor and niece can check on her regularly.  Oncology History  Anal cancer ([place])  09/15/2019 Initial Diagnosis   Anal cancer ([place])    10/03/2019 -  Chemotherapy   The patient had mitoMYcin (MUTAMYCIN) chemo injection 18 mg, 8 mg/m2 = 18 mg (100 % of original dose 8 mg/m2), Intravenous,  Once, 1 of 1 cycle Dose modification: 8 mg/m2 (original dose 8 mg/m2, Cycle 1, Reason: Provider Judgment) Administration: 18 mg (10/03/2019) fluorouracil (ADRUCIL) 8,900 mg in sodium chloride 0.9 % 72 mL chemo infusion, 1,000 mg/m2/day = 8,900 mg, Intravenous, 4D (96 hours ), 1 of 1 cycle Dose modification: 300 mg/m2/day (original dose 1,000 mg/m2/day, Cycle 1, Reason: Provider Judgment) Administration: 8,900 mg (10/03/2019), 2,650 mg (11/07/2019)   for chemotherapy treatment.      PHYSICAL EXAMINATION:  Vitals:   06/21/21 0400 06/21/21 0728  BP: 129/66 (!) 183/82  Pulse: 65 76  Resp: 14 16  Temp: 98.5 F (36.9 C) 98.8 F (37.1 C)  SpO2: 94% 95%   Filed Weights   06/18/21 2205  Weight: 113.4 kg    Intake/Output from previous day: 06/30 0701 - 07/01 0700 In: 1180 [P.O.:1080; IV Piggyback:100] Out: 550 [Urine:550]  GENERAL: Chronically ill-appearing female, no distress OROPHARYNX:no exudate, no erythema and lips, buccal mucosa, and tongue normal  LUNGS: clear to auscultation and percussion with normal breathing effort HEART: regular rate &  rhythm and no murmurs and no lower extremity edema ABDOMEN:abdomen soft, non-tender and normal bowel sounds Musculoskeletal: Able to move legs minimally, cannot lift legs off bed NEURO: Alert, slow to respond at times  LABORATORY DATA:  I have reviewed the data as listed CMP Latest Ref Rng & Units 06/21/2021 06/20/2021 06/18/2021  Glucose 70 - 99 mg/dL 113(H) 81 -  BUN 8 - 23 mg/dL 19 23 -  Creatinine 0.44 - 1.00 mg/dL 0.64 0.98 -  Sodium 135 - 145 mmol/L 140 140 136  Potassium 3.5 - 5.1 mmol/L 3.2(L) 4.0 3.8  Chloride 98 - 111 mmol/L 106 104 -  CO2 22 - 32 mmol/L 28 29 -  Calcium 8.9 - 10.3 mg/dL 8.6(L) 9.0 -  Total Protein 6.5 - 8.1 g/dL - - -  Total Bilirubin 0.3 - 1.2 mg/dL - - -  Alkaline Phos 38 - 126 U/L - - -  AST 15 - 41 U/L - - -  ALT 0 - 44 U/L - - -    Lab Results  Component Value Date   WBC 5.1 06/21/2021   HGB 10.1 (L) 06/21/2021   HCT 30.6 (L) 06/21/2021   MCV 94.7 06/21/2021   PLT PLATELETS APPEAR INCREASED 06/21/2021   NEUTROABS 10.6 (H) 06/18/2021    DG Chest Port 1 View  Result Date: 06/18/2021 CLINICAL DATA:  Dyspnea, altered mental status EXAM: PORTABLE CHEST 1 VIEW COMPARISON:  09/27/2020 FINDINGS: Lung volumes are small. Left basilar focal pulmonary infiltrate has developed,  possibly infectious in the appropriate clinical setting. No pneumothorax. Tiny left pleural effusion. Cardiac size within normal limits. No acute bone abnormality. IMPRESSION: Left basilar focal pulmonary infiltrate, possibly infectious, with associated tiny left parapneumonic effusion. 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  3 hypermetabolic left inguinal lymph nodes.  Left external iliac node with minimally higher than blood pool activity.  1.0 x 0.8 cm left thyroid nodule with activity mildly above background blood pool activity but below liver activity.  No perceptible hypermetabolic activity in the vicinity of the lateral right hepatic lobe lesion seen on the 09/14/2019 CT. Began concurrent chemoRT with 5FU/mitomycin on 10/03/2019 Cycle 2 5-FU, dose reduced, mitomycin held 11/07/2019 Radiation completed 11/24/2019 CT abdomen/pelvis 12/27/2019-extensive left-sided colonic diverticulosis without diverticulitis.  No bowel obstruction.  No abscess in the abdomen or pelvis.  Rectum borderline distended with stool without perirectal wall thickening or soft tissue stranding.  Left inguinal and external iliac lymph nodes now subcentimeter compared to the previous size.  Stable small lesion in the right lobe of the liver.  No new liver lesions evident. Anal exam with biopsy 06/06/2021 -biopsy consistent with invasive moderately to poorly differentiated squamous cell carcinoma COPD Fibromyalgia Chronic back pain Hypothyroid Depression History of uterine cancer-age 75 Pain secondary to #1 Orthostatic hypotension, fatigue, dyspnea on exertion, early mucositis requiring supportive care on day 9, secondary to chemotherapy 10. Thrombocytopenia PLT 58K and neutropenia ANC 1.4 on day 9, secondary to chemotherapy  11. Worsening cytopenias, PLT 8K and ANC 0.0 on day 15, secondary to chemotherapy. Started prophylaxis with cipro on 10/22 day 11 12.  Hospital admission 10/17/2019 -fatigue, pancytopenia, hypokalemia 13.  Left thyroid nodule with activity mildly above background blood pool activity but below liver activity on PET scan 09/27/2019.   Thyroid ultrasound 219 2021-1.6 cm nodule left inferior thyroid gland.  02/24/72 FNA-benign follicular nodule. 14.  Tiny lung nodules noted on chest CT 09/14/2019 15.  Falls, back/leg pain and leg weakness-CT lumbar spine 05/26/2020 with no acute abnormality within the lumbar spine; multilevel degenerative spondylosis with resultant mild canal with bilateral lateral recess stenosis at L4-5; moderate bilateral L4 and L5 foraminal stenosis related to disc bulge, reactive endplate changes and facet degeneration; soft tissue density/stranding involving the presacral space anterior to the sacrum, incompletely visualized MRI thoracic and lumbar spine 06/14/2020-no mass or fracture, degenerative changes in the thoracic and lumbar spine MRI brain 06/21/2020-no acute abnormality MRI cervical, thoracic and lumbar spine 06/21/2020-no abnormal cervical cord signal, mild cervical degenerative changes without high-grade stenosis, no significant abnormal enhancement in the thoracolumbar spine. MRI pelvis 06/21/2020-no clearly defined mass or asymmetry in the anal/perianal region or adjacent rectum, mild but new presacral edema within the iliacus muscles, piriformis muscles, central gluteus muscles, and bilateral hip adductor musculature in a symmetric fashion. This is also accompanied by low-grade enhancement. This may reflect myositis, and association with prior regional radiation therapy is not excluded. Trial of prednisone 60 mg daily 06/22/2020, tapered by radiation oncology, dose increased to 40 mg  daily 08/06/2020, tapered to 30 mg daily beginning 09/13/2020 16.  Left-sided pulmonary embolism 09/27/2020 Doppler 09/28/2020-age-indeterminate DVT in the right posterior tibial veins 17.  Admission 09/27/2020 with progressive leg weakness 18.  Hospital admission 06/18/2021-sepsis secondary to pneumonia and UTI  Ms. Hiegel has been admitted due to sepsis from pneumonia and UTI.  She is currently enrolled with hospice plans to discharge  with hospice services.  Plan according to note from hospice indicates that she plans to return home with hospice services once stable.  Agree with DNR/DNI and plan to discharge with hospice services.  Pain is adequately controlled at this time.  She did express some confusion related to methadone.  Discussed with nursing who states that hospitalist was planning to decrease methadone dosing.  Will defer to them.  Treatment of sepsis from pneumonia and UTI per hospitalist.  Recommendations: Continue to treat infections per hospitalist. Recommend discontinuing methadone secondary to confusion.  Oxycodone/APAP as needed for pain Agree with discharge with hospice services and DNR.  Please call oncology over the weekend for questions.  We will check on her again early next week if she remains in the hospital.   LOS: 2 days   Mikey Bussing, DNP, AGPCNP-BC, AOCNP 06/21/21 Ms. Tuft is well-known to me with a history of anal cancer and lower extremity paraplegia.  The etiology of the paraplegia is unclear despite an extensive diagnostic work-up.  She has developed progressive leg weakness and is now bedbound.  She was discovered to have local recurrence of anal cancer earlier this month.  She is not a candidate for salvage surgery.  I agree with the plan for hospice care.  She has a poor prognosis based on multiple comorbid conditions and her bedbound status.  I think it will be very difficult for her to live at home, but I support her decision to live at home if she and her family are able to provide adequate care.  She has enrolled in hospice care.  I will be glad to serve as the primary provider with hospice.  She apparently became confused on hospital admission.  She relates the confusion to starting a higher dose of methadone.  I would discontinue methadone and use oxycodone/APAP as needed.  The narcotic regimen can be adjusted by the hospice medical director at discharge.  I will check on her  06/24/2021.  I was present for greater than 50% of today's visit.  I performed medical decision making.

## 2021-06-21 NOTE — TOC Progression Note (Signed)
Transition of Care [facility]) - Progression Note    Patient Details  Name: Joanne Klein MRN: [id number] Date of Birth: 11-30-1946  [facility] CM/SW Contact  Verdell Carmine, RN Phone Number: 06/21/2021, 4:50 PM  Clinical Narrative:    Just started with home hospice, Patient desires to be in a hospice facility, [facility] D/W patient at bedside ( see note). Patient is questionable that she meets qualifications for this, but also may does not meet qualifications for SNF. Does not want to go back home with out 24 hour care.  Will place consult for PT for recommendations and d/w provider.   Expected Discharge Plan: Home w Hospice Care Barriers to Discharge: Continued Medical Work up  Expected Discharge Plan and Services Expected Discharge Plan: [place] Acute Care Choice: Hospice                                         Social Determinants of Health (SDOH) Interventions    Readmission Risk Interventions Readmission Risk Prevention Plan 06/22/2020  Transportation Screening Complete  PCP or Specialist Appt within 5-7 Days Not Complete  Not Complete comments disposition pending  Home Care Screening Complete  Medication Review (RN CM) Referral to Pharmacy  Some recent data might be hidden

## 2021-06-21 NOTE — Progress Notes (Addendum)
PROGRESS NOTE    Joanne Klein  ZES:[id number] DOB: Sep 06, 1946 DOA: 06/18/2021 PCP: Hoyt Koch, MD   Brief Narrative:   Joanne Klein is a 75 y.o. 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  Patient's niece reported that a neighbor came to check on the patient around 3 PM and noted that she was more drowsy but when the patient's daughter came to check on her around 8:30 PM she was found confused.  She is typically alert and oriented x3 at baseline.  Family felt that her extremities were cool to touch.  Upon EMS arrival, oxygen saturation was 79% on room air and she was placed on 10 L oxygen via nonrebreather.  Family also reported a productive cough.  Patient just recently underwent biopsy due to concern for recurrence of anal cancer which shows squamous cell characteristics - family reported that they met with hospice and signed a DNR.    Assessment & Plan:   Principal Problem:   Pneumonia Active Problems:   UTI (urinary tract infection)   Severe sepsis (HCC)   Acute hypoxemic respiratory failure (HCC)   Elevated troponin  GOALS OF CARE -Lengthy discussion with daughter yesterday, patient already following with hospice, at this time we will continue aggressive care for her likely to me acquired pneumonia and UTI sepsis but should she worsen we have discussed transitioning to palliative care. -Patient thankfully more awake and alert this morning as we wean down her narcotics, she remains optimistic about  being discharged home but we discussed that given her baseline bedbound status with no care at home other than occasional neighbor assistance this will likely not be an ideal discharge location.  We discussed involving hospice care to discuss assistance programs at home versus transitioning to hospice house. Patient was less inclined to transition to hospice house unless absolutely necessary.  Severe sepsis secondary to community - acquired pneumonia and UTI, resolving - Blood pressure improving over the past 48 hours, encourage p.o. intake, wean IV fluids as tolerated - Continue broad-spectrum antibiotics with cefepime, vancomycin discontinued given MRSA swabs negative - Continue to follow closely, patient is DNR, recently evaluated by hospice due to metastatic cancer diagnosis as below.   Acute hypoxemic respiratory failure secondary to community-acquired pneumonia/sepsis - Secondary to above severe sepsis, improving slowly daily - Continue antibiotics as above, wean oxygen as tolerated, incentive spirometry and flutter also offered today. - Hold off on steroids given this does not appear to be radiation pneumonitis per discussion with radiation oncology given its location and diffuse pattern.   Elevated troponin in the setting of above - EKG negative, without chest pain. - Likely supply demand mismatch in the setting of profound hypoxia and severe sepsis.   AKI, resolved -Secondary to above, resolving with increased p.o. intake  Acute metabolic encephalopathy, resolving - Patient appears to be closer to baseline today, ANO x4 more appropriate discussion and history - Cannot rule out polypharmacy given patient is on multiple narcotics   Anal squamous cell carcinoma - Status post chemo and radiation in 2020 and was in remission until recently when she started  having rectal bleeding and underwent rectal exam under anesthesia by Dr. Marcello Moores on 06/06/2021 which revealed an ulcerated mass  involving the left side of the anal canal and perianal skin. Biopsy showing invasive moderate to poorly differentiated squamous cell carcinoma. Patient states she is aware of the biopsy results and is trying to make an appointment with Dr. Benay Spice. - Outpatient oncology follow-up as scheduled.   History of DVT and PE - Continue Xarelto  Hyperlipidemia - Continue statin  Hypothyroidism - Continue Synthroid   Chronic back pain - On methadone now   DVT prophylaxis: Xarelto Code Status: DNR Family Communication: Niece Andi over the phone.  Status is: Inpt  Dispo: The patient is from: Home             Anticipated d/c is to: Home             Anticipated d/c date is: 48-72h             Patient currently NOT medically stable for discharge  Consultants:  None  Procedures:  None  Antimicrobials:  Cefepime ongoing Vancomycin discontinued  Subjective: No acute issues/events overnight, mental status improving today, pain moderately well controlled but otherwise denies nausea vomiting headache fevers or chills.  Objective: Vitals:   06/20/21 2024 06/21/21 0000 06/21/21 0400 06/21/21 0728  BP: (!) 151/71  129/66 (!) 183/82  Pulse:   65 76  Resp:   14 16  Temp: 98.9 F (37.2 C) 99.3 F (37.4 C) 98.5 F (36.9 C) 98.8 F (37.1 C)  TempSrc: Oral Oral Oral Oral  SpO2:   94% 95%  Weight:      Height:        Intake/Output Summary (Last 24 hours) at 06/21/2021 0800 Last data filed at 06/21/2021 0018 Gross per 24 hour  Intake 1180 ml  Output 550 ml  Net 630 ml    Filed Weights   06/18/21 2205  Weight: 113.4 kg    Examination:  General exam: Appears calm and comfortable, awake alert oriented x4, no acute distress Respiratory system: Bilateral rhonchi. Cardiovascular system: S1 & S2 heard, RRR. No JVD, murmurs, rubs, gallops or clicks. No pedal edema. Gastrointestinal system: Abdomen is nondistended, soft and nontender. No organomegaly or masses felt. Normal bowel sounds  heard. Central nervous system: Alert and oriented.  Chronic bilateral lower extremity weakness. Extremities: Symmetric 5 x 5 power bilateral upper extremities, 1 out of 5 lower extremity strength equal bilaterally chronic. Skin: No rashes, lesions or ulcers Psychiatry: Judgement and insight appear normal. Mood & affect appropriate.   Data Reviewed: I have personally reviewed following labs and imaging studies  CBC: Recent Labs  Lab 06/18/21 2237 06/18/21 2341 06/20/21 0349 06/21/21 0222  WBC 12.7*  --  6.3 5.1  NEUTROABS 10.6*  --   --   --   HGB 14.0 13.3 10.7* 10.1*  HCT 42.6 39.0 32.5* 30.6*  MCV 95.5  --  96.7 94.7  PLT 149*  --  PLATELET CLUMPS NOTED ON SMEAR, COUNT APPEARS ADEQUATE PLATELETS APPEAR INCREASED    Basic Metabolic Panel: Recent Labs  Lab 06/18/21 2237 06/18/21 2341 06/20/21 0349 06/21/21 0222  NA 135 136 140 140  K 3.7 3.8 4.0 3.2*  CL 99  --  104 106  CO2 24  --  29 28  GLUCOSE 120*  --  81 113*  BUN 19  --  23 19  CREATININE 1.17*  --  0.98 0.64  CALCIUM 9.1  --  9.0 8.6*  GFR: Estimated Creatinine Clearance: 80 mL/min (by C-G formula based on SCr of 0.64 mg/dL).  Liver Function Tests: Recent Labs  Lab 06/18/21 2237  AST 23  ALT 18  ALKPHOS 84  BILITOT 0.9  PROT 6.5  ALBUMIN 2.9*    No results for input(s): LIPASE, AMYLASE in the last 168 hours. No results for input(s): AMMONIA in the last 168 hours. Coagulation Profile: No results for input(s): INR, PROTIME in the last 168 hours. Cardiac Enzymes: No results for input(s): CKTOTAL, CKMB, CKMBINDEX, TROPONINI in the last 168 hours. BNP (last 3 results) No results for input(s): PROBNP in the last 8760 hours. HbA1C: No results for input(s): HGBA1C in the last 72 hours. CBG: Recent Labs  Lab 06/19/21 1615  GLUCAP 87    Lipid Profile: No results for input(s): CHOL, HDL, LDLCALC, TRIG, CHOLHDL, LDLDIRECT in the last 72 hours. Thyroid Function Tests: No results for input(s):  TSH, T4TOTAL, FREET4, T3FREE, THYROIDAB in the last 72 hours. Anemia Panel: No results for input(s): VITAMINB12, FOLATE, FERRITIN, TIBC, IRON, RETICCTPCT in the last 72 hours. Sepsis Labs: Recent Labs  Lab 06/18/21 0012 06/19/21 0247  LATICACIDVEN 2.5* 1.4     Recent Results (from the past 240 hour(s))  Urine culture     Status: Abnormal   Collection Time: 06/17/21  2:29 AM   Specimen: Urine, Random  Result Value Ref Range Status   Specimen Description URINE, RANDOM  Final   Special Requests   Final    Immunocompromised Performed at [facility], [place] [address], [place], [place] [address]    Culture MULTIPLE SPECIES PRESENT, SUGGEST RECOLLECTION (A)  Final   Report Status 06/20/2021 FINAL  Final  Resp Panel by RT-PCR (Flu A&B, Covid) Nasopharyngeal Swab     Status: None   Collection Time: 06/18/21 11:18 PM   Specimen: Nasopharyngeal Swab; Nasopharyngeal(NP) swabs in vial transport medium  Result Value Ref Range Status   SARS Coronavirus 2 by RT PCR NEGATIVE NEGATIVE Final    Comment: (NOTE) SARS-CoV-2 target nucleic acids are NOT DETECTED.  The SARS-CoV-2 RNA is generally detectable in upper respiratory specimens during the acute phase of infection. The lowest concentration of SARS-CoV-2 viral copies this assay can detect is 138 copies/mL. A negative result does not preclude SARS-Cov-2 infection and should not be used as the sole basis for treatment or other patient management decisions. A negative result may occur with  improper specimen collection/handling, submission of specimen other than nasopharyngeal swab, presence of viral mutation(s) within the areas targeted by this assay, and inadequate number of viral copies(<138 copies/mL). A negative result must be combined with clinical observations, patient history, and epidemiological information. The expected result is Negative.  Fact Sheet for Patients:  EntrepreneurPulse.com.au  Fact Sheet for  Healthcare Providers:  IncredibleEmployment.be  This test is no t yet approved or cleared by the [place] FDA and  has been authorized for detection and/or diagnosis of SARS-CoV-2 by FDA under an Emergency Use Authorization (EUA). This EUA will remain  in effect (meaning this test can be used) for the duration of the COVID-19 declaration under Section 564(b)(1) of the Act, 21 U.S.C.section 360bbb-3(b)(1), unless the authorization is terminated  or revoked sooner.       Influenza A by PCR NEGATIVE NEGATIVE Final   Influenza B by PCR NEGATIVE NEGATIVE Final    Comment: (NOTE) The Xpert Xpress SARS-CoV-2/FLU/RSV plus assay is intended as an aid in the diagnosis of influenza from Nasopharyngeal swab specimens and should not  be used as a sole basis for treatment. Nasal washings and aspirates are unacceptable for Xpert Xpress SARS-CoV-2/FLU/RSV testing.  Fact Sheet for Patients: EntrepreneurPulse.com.au  Fact Sheet for Healthcare Providers: IncredibleEmployment.be  This test is not yet approved or cleared by the [place] FDA and has been authorized for detection and/or diagnosis of SARS-CoV-2 by FDA under an Emergency Use Authorization (EUA). This EUA will remain in effect (meaning this test can be used) for the duration of the COVID-19 declaration under Section 564(b)(1) of the Act, 21 U.S.C. section 360bbb-3(b)(1), unless the authorization is terminated or revoked.  Performed at [facility], [place] [address] [place], [place] [address]   Blood culture (routine x 2)     Status: None (Preliminary result)   Collection Time: 06/19/21 12:01 AM   Specimen: BLOOD  Result Value Ref Range Status   Specimen Description BLOOD RIGHT ANTECUBITAL  Final   Special Requests   Final    BOTTLES DRAWN AEROBIC AND ANAEROBIC Blood Culture adequate volume   Culture   Final    NO GROWTH 2 DAYS Performed at [place], [place] [address] [place], [place] [address]    Report Status PENDING  Incomplete  MRSA Next Gen by PCR, Nasal     Status: None   Collection Time: 06/19/21  5:37 AM   Specimen: Nasal Mucosa; Nasal Swab  Result Value Ref Range Status   MRSA by PCR Next Gen NOT DETECTED NOT DETECTED Final    Comment: (NOTE) The GeneXpert MRSA Assay (FDA approved for NASAL specimens only), is one component of a comprehensive MRSA colonization surveillance program. It is not intended to diagnose MRSA infection nor to guide or monitor treatment for MRSA infections. Test performance is not FDA approved in patients less than 27 years old. Performed at [facility], [place] [address], [place], [place] [address]   Blood culture (routine x 2)     Status: None (Preliminary result)   Collection Time: 06/19/21  6:57 AM   Specimen: BLOOD  Result Value Ref Range Status   Specimen Description BLOOD SITE NOT SPECIFIED  Final   Special Requests   Final    BOTTLES DRAWN AEROBIC AND ANAEROBIC Blood Culture results may not be optimal due to an inadequate volume of blood received in culture bottles   Culture   Final    NO GROWTH 2 DAYS Performed at [facility], [place] [address], [place], [place] [address]    Report Status PENDING  Incomplete      Radiology Studies: No results found.  Scheduled Meds:  [START ON 06/22/2021] atorvastatin  20 mg Oral Once per day on Sun Tue Thu Sat   levothyroxine  75 mcg Oral Q0600   methadone  5 mg Oral Q6H   polyethylene glycol  17 g Oral BID   rivaroxaban  20 mg Oral Q supper   Continuous Infusions:  ceFEPime (MAXIPIME) IV 2 g (06/20/21 2132)     LOS: 2 days   Time spent: 46mn  Modesta Sammons C Jemaine Prokop, DO [facility]  If 7PM-7AM, please contact night-coverage [email or web address]  06/21/2021, 8:00 AM

## 2021-06-21 NOTE — Plan of Care (Signed)

## 2021-06-22 LAB — BASIC METABOLIC PANEL
Anion gap: 8 (ref 5–15)
BUN: 10 mg/dL (ref 8–23)
CO2: 29 mmol/L (ref 22–32)
Calcium: 8.7 mg/dL — ABNORMAL LOW (ref 8.9–10.3)
Chloride: 102 mmol/L (ref 98–111)
Creatinine, Ser: 0.52 mg/dL (ref 0.44–1.00)
GFR, Estimated: >60 mL/min (ref >60)
Glucose, Bld: 84 mg/dL (ref 70–99)
Potassium: 3.1 mmol/L — ABNORMAL LOW (ref 3.5–5.1)
Sodium: 139 mmol/L (ref 135–145)

## 2021-06-22 LAB — CBC
HCT: 33.6 % — ABNORMAL LOW (ref 36.0–46.0)
Hemoglobin: 11.3 g/dL — ABNORMAL LOW (ref 12.0–15.0)
MCH: 31 pg (ref 26.0–34.0)
MCHC: 33.6 g/dL (ref 30.0–36.0)
MCV: 92.3 fL (ref 80.0–100.0)
Platelets: 128 10*3/uL — ABNORMAL LOW (ref 150–400)
RBC: 3.64 MIL/uL — ABNORMAL LOW (ref 3.87–5.11)
RDW: 13 % (ref 11.5–15.5)
WBC: 5.2 10*3/uL (ref 4.0–10.5)
nRBC: 0 % (ref 0.0–0.2)

## 2021-06-22 MED ORDER — HYDRALAZINE HCL 10 MG PO TABS
10.0000 mg | ORAL_TABLET | Freq: Four times a day (QID) | ORAL | Status: DC | PRN
  Administered 2021-06-22 – 2021-06-23 (×4): 10 mg via ORAL
  Filled 2021-06-22 (×5): qty 1

## 2021-06-22 MED ORDER — POLYETHYLENE GLYCOL 3350 17 G PO PACK
17.0000 g | PACK | ORAL | Status: AC
  Administered 2021-06-22 (×2): 17 g via ORAL
  Filled 2021-06-22: qty 1

## 2021-06-22 NOTE — TOC Initial Note (Signed)
Transition of Care [facility]) - Initial/Assessment Note    Patient Details  Name: Joanne Klein MRN: [id number] Date of Birth: 27-Apr-1946  [facility] of [facility]) CM/SW Contact:    Carles Collet, RN Phone Number: 06/22/2021, 11:45 AM  Clinical Narrative:      Damaris Schooner w patient at bedside, and niece Jonni Sanger over the phone in while in the room. Patient expresses wanting to [place] home w [facility] home hospice. This was set up prior to admission, however they had not met her at her house before she returned to the hospital. They are aware of admission and are following. Per the niece Jonni Sanger [phone]), the patient will have support from her next door neighbor, Thayer Headings, starting around noon, to 3am every day. Jonni Sanger will come to the house every day around 7am as well. Jonni Sanger states they "have it taken of" and that Thayer Headings will be at the patient's "beck and call." Jonni Sanger and the patient were very secure with this plan.   Patient will need nonemergency medical transport at discharge, and will need to use [facility] as she is active w [facility].                Expected Discharge Plan: Home w Hospice Care Barriers to Discharge: Continued Medical Work up   Patient Goals and CMS Choice Patient states their goals for this hospitalization and ongoing recovery are:: patient wants to return home      Expected Discharge Plan and Services Expected Discharge Plan: [place]   Discharge Planning Services: CM Consult Post Acute Care Choice: Hospice Living arrangements for the past 2 months: Single Family Home                 DME Arranged: N/A           HH Agency: Hospice and [place]        Prior Living Arrangements/Services Living arrangements for the past 2 months: [place] with:: Self Patient language and need for interpreter reviewed:: Yes        Need for Family Participation in Patient Care: Yes (Comment) Care giver support system in place?: Yes (comment) Current  home services: DME Criminal Activity/Legal Involvement Pertinent to Current Situation/Hospitalization: No - Comment as needed  Activities of Daily Living Home Assistive Devices/Equipment: [facility] bed, Walker (specify type), Trapeze, Wheelchair ADL Screening (condition at time of admission) Patient's cognitive ability adequate to safely complete daily activities?: Yes Is the patient deaf or have difficulty hearing?: No Does the patient have difficulty seeing, even when wearing glasses/contacts?: No Does the patient have difficulty concentrating, remembering, or making decisions?: No Patient able to express need for assistance with ADLs?: Yes Does the patient have difficulty dressing or bathing?: Yes Independently performs ADLs?: No Communication: Independent Dressing (OT): Needs assistance Is this a change from baseline?: Pre-admission baseline Grooming: Needs assistance Is this a change from baseline?: Pre-admission baseline Feeding: Independent Bathing: Needs assistance Is this a change from baseline?: Pre-admission baseline Toileting: Needs assistance Is this a change from baseline?: Pre-admission baseline In/Out Bed: Needs assistance Is this a change from baseline?: Pre-admission baseline Walks in Home: Dependent Is this a change from baseline?: Pre-admission baseline Does the patient have difficulty walking or climbing stairs?: Yes Weakness of Legs: Both Weakness of Arms/Hands: None  Permission Sought/Granted                  Emotional Assessment       Orientation: : Oriented to  Self, Oriented to Place, Oriented to  Time, Oriented to Situation Alcohol / Substance Use: Not Applicable Psych Involvement: No (comment)  Admission diagnosis:  Pneumonia [J18.9] Pneumonia of left lower lobe due to infectious organism [J18.9] Sepsis with acute hypoxic respiratory failure without septic shock, due to unspecified organism ([place]) [A41.9, R65.20, J96.01] Patient  Active Problem List   Diagnosis Date Noted   Pneumonia 06/19/2021   UTI (urinary tract infection) 06/19/2021   Severe sepsis ([place]) 06/19/2021   Acute hypoxemic respiratory failure ([place]) 06/19/2021   Elevated troponin 06/19/2021   Gait abnormality 10/25/2020   Vitamin B12 deficiency 10/25/2020   Heme positive stool    Acute blood loss anemia    History of anal cancer    History of pulmonary embolism    Pulmonary embolism ([place]) 09/27/2020   Pain    Lumbar radiculopathy    Weakness of both legs 06/19/2020   Falls frequently 06/19/2020   Weakness 06/19/2020   Chronic respiratory failure with hypoxia ([place]) 12/15/2019   Orthostatic hypotension 12/15/2019   Numbness and tingling 12/15/2019   Chemotherapy-induced neutropenia (HCC)    Thrombocytopenia ([place]) 10/17/2019   Anal cancer ([place]) 09/15/2019   Routine general medical examination at a health care facility 11/19/2016   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   Morbid obesity ([place]) 06/26/2014   COPD GOLD II     Hyperlipidemia    Arthritis 01/16/2014   Depression 01/13/2014   Hypothyroidism 01/13/2014   GERD (gastroesophageal reflux disease) 01/13/2014   OCD (obsessive compulsive disorder) 01/13/2014   Fibromyalgia 01/13/2014   PCP:  Hoyt Koch, MD Pharmacy:   [facility] [place], [place] AT [address] [place]  [address] Phone: [phone] Fax: [phone]  [facility]  ([place]) - [place], [place] - [address] [place] [address] Phone: [phone] Fax: [phone]     Social Determinants of Health ([place]) Interventions    Readmission Risk Interventions Readmission Risk Prevention Plan 06/22/2020  Transportation Screening Complete  PCP or Specialist Appt within 5-7 Days Not Complete  Not Complete comments disposition pending  Home Care Screening Complete  Medication  Review (RN CM) Referral to Pharmacy  Some recent data might be hidden

## 2021-06-22 NOTE — Progress Notes (Signed)
[facility] [id number] - [facility]) hospitalized hospice patient.   This patient is a current [facility] hospice patient with a terminal Diagnosis of anal cancer. Hospice nurse attempted to visit today and patient was not home. Contacted family member who reported patient was transported to ED for evaluation. Per ED notes patient had been more drowsy and confused. EMS was activated and patient was transported to [facility] ED. Patient was admitted to [facility] on 6.29.2022 with a diagnosis of pneumonia and sepsis. Per Dr. Tomasa Hosteller with [facility] this is a related hospital admission.   Visited patient at bedside.   Pt appeared with increased work of breath, reported SOB.  Pt reports living on 3.5-4L; on 2L at that time.  Consulted with bedside RN; O2 increased to 3.5 for comfort. Dr. Avon Gully made aware of this and of pt's complaints of constipation and pain with passing gas.  Spoke with niece Andi on speakerphone in the room who shared some frustrations with stay and a  desire for PNA to be treated aggressively.  These concerns were relayed to Dr. Avon Gully and bedside RN Georgina Peer.  Concerns addressed.  No new needs at this time.    This patient is appropriate for appropriate for inpatient services due to ongoing IV antibiotic treatment.    VS: 98.0 oral, 171/144, 88, 14, 94% 2Lnc I/O: 800/3325   Abnormal Labs:  K 3.1,  Ca 8.7, RBC 3.64, Hgb 11.3, platelets 128k  Diagnostics: No new imaging   IV/PRN Medications: Maxipime 2g IVPB BID Zofran 4mg  IV  q6h PRN nausea/vomiting x 2 doses /24 h Oxycodone-acetaminophen 5-325 mg 1-2 tabs PO PRN moderate pain (1 dose of 2 tabs given x 1 in 24 hours)   Problem list:   Principal Problem:   Pneumonia Active Problems:   UTI (urinary tract infection)   Severe sepsis (HCC)   Acute hypoxemic respiratory failure (HCC)   Elevated troponin  GOALS OF CARE -Lengthy discussion with daughter previously, patient already following with hospice, at this time we will continue  aggressive care for her likely to me acquired pneumonia and UTI sepsis but should she worsen we have discussed transitioning to palliative care. -Patient thankfully more awake and alert this morning as we wean down her narcotics, she remains optimistic about being discharged home with extra support from family and hospice. We discussed involving hospice care at home versus transitioning to hospice house. Patient was less inclined to transition to hospice house unless absolutely necessary.   Severe sepsis secondary to community - acquired pneumonia and UTI, resolving - Blood pressure improving, borderline elevated in the setting of pain secondary to below - Continue broad-spectrum antibiotics with cefepime, vancomycin discontinued given MRSA swabs negative - Continue to follow closely, patient is DNR, recently evaluated by hospice due to metastatic cancer diagnosis as below.   Acute hypoxemic respiratory failure secondary to community-acquired pneumonia/sepsis - Secondary to above severe sepsis, improving slowly daily - Continue antibiotics as above, wean oxygen as tolerated, incentive spirometry and flutter also offered today. - Hold off on steroids given this does not appear to be radiation pneumonitis per discussion with radiation oncology given its location and diffuse pattern.   Elevated troponin in the setting of above - EKG negative, without chest pain. - Likely supply demand mismatch in the setting of profound hypoxia and severe sepsis.   AKI, resolved -Secondary to above, resolving with increased p.o. intake   Acute metabolic encephalopathy, resolving - Patient appears to be closer to baseline today, ANO x4 more appropriate discussion  and history - Cannot rule out polypharmacy given patient was on multiple narcotics prior to admission   Anal squamous cell carcinoma Intractable pain, improving -Continue to follow outpatient with oncology -Transition from methadone to Percocet for  pain control -patient previously on both but appears to tolerate poorly given mental status changes and polypharmacy as above   History of DVT and PE - Continue Xarelto  Hyperlipidemia - Continue statin  Hypothyroidism - Continue Synthroid   Chronic back pain -Transition to Percocet, methadone discontinued   Discharge Planning: Return home with hospice services when stable for discharge.     Family Contact: Spoke with niece, Bernadette Hoit, and updated her.  Her concerns were relayed to the inpatient team.   IDG: Updated   Goals of Care: Clear. Treat for current infection and maintain comfort. DNR   Medication lists and transfer summary placed on shadow chart.    Should patient need ambulance transport at discharge please use [facility].   Please do not hesitate to call with questions.   Thank you for the opportunity to participate in this patient's care.  Domenic Moras, BSN, RN     [facility] Liaison [phone] (24h on call)

## 2021-06-22 NOTE — Progress Notes (Signed)
PROGRESS NOTE    Joanne Klein  GOT:[id number] DOB: 1946/03/08 DOA: 06/18/2021 PCP: Hoyt Koch, MD   Brief Narrative:   Joanne Klein is a 75 y.o. 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  Patient's niece reported that a neighbor came to check on the patient around 3 PM and noted that she was more drowsy but when the patient's daughter came to check on her around 8:30 PM she was found confused.  She is typically alert and oriented x3 at baseline.  Family felt that her extremities were cool to touch.  Upon EMS arrival, oxygen saturation was 79% on room air and she was placed on 10 L oxygen via nonrebreather.  Family also reported a productive cough.  Patient just recently underwent biopsy due to concern for recurrence of anal cancer which shows squamous cell characteristics - family reported that they met with hospice and signed a DNR.    Assessment & Plan:   Principal Problem:   Pneumonia Active Problems:   UTI (urinary tract infection)   Severe sepsis (HCC)   Acute hypoxemic respiratory failure (HCC)   Elevated troponin  GOALS OF CARE -Lengthy discussion with daughter previously, patient already following with hospice, at this time we will continue aggressive care for her likely to me acquired pneumonia and UTI sepsis but should she worsen we have discussed transitioning to palliative care. -Patient thankfully more awake and alert this morning as we wean down her narcotics, she remains optimistic  about being discharged home with extra support from family and hospice. We discussed involving hospice care at home versus transitioning to hospice house. Patient was less inclined to transition to hospice house unless absolutely necessary.  Severe sepsis secondary to community - acquired pneumonia and UTI, resolving - Blood pressure improving, borderline elevated in the setting of pain secondary to below - Continue broad-spectrum antibiotics with cefepime, vancomycin discontinued given MRSA swabs negative - Continue to follow closely, patient is DNR, recently evaluated by hospice due to metastatic cancer diagnosis as below.   Acute hypoxemic respiratory failure secondary to community-acquired pneumonia/sepsis - Secondary to above severe sepsis, improving slowly daily - Continue antibiotics as above, wean oxygen as tolerated, incentive spirometry and flutter also offered today. - Hold off on steroids given this does not appear to be radiation pneumonitis per discussion with radiation oncology given its location and diffuse pattern.   Elevated troponin in the setting of above - EKG negative, without chest pain. - Likely supply demand mismatch in the setting of profound hypoxia and severe sepsis.   AKI, resolved -Secondary to above, resolving with increased p.o. intake  Acute metabolic encephalopathy, resolving - Patient appears to be closer to baseline today, ANO x4 more appropriate discussion and history - Cannot rule out polypharmacy given patient was on multiple narcotics prior to admission   Anal squamous cell carcinoma Intractable pain, improving -Continue to follow outpatient with oncology -Transition from methadone to Percocet for pain control -patient previously on both but appears to tolerate poorly given mental status changes and polypharmacy as above   History of DVT and PE - Continue  Xarelto  Hyperlipidemia - Continue statin  Hypothyroidism - Continue Synthroid    Chronic back pain -Transition to Percocet, methadone discontinued   DVT prophylaxis: Xarelto Code Status: DNR Family Communication: Niece Andi over the phone.  Status is: Inpt  Dispo: The patient is from: Home             Anticipated d/c is to: Home versus SNF             Anticipated d/c date is: 24-48 h             Patient currently NOT medically stable for discharge  Consultants:  None  Procedures:  None  Antimicrobials:  Cefepime ongoing Vancomycin discontinued  Subjective: No acute issues/events overnight, mental status improving today, pain poorly controlled today denies nausea vomiting diarrhea constipation headache fevers or chills.  Objective: Vitals:   06/21/21 1949 06/21/21 2019 06/22/21 0031 06/22/21 0552  BP:      Pulse: 91     Resp: 15     Temp:  98.7 F (37.1 C) 98.3 F (36.8 C) 98.1 F (36.7 C)  TempSrc:  Oral Oral Oral  SpO2: 92%     Weight:      Height:        Intake/Output Summary (Last 24 hours) at 06/22/2021 0740 Last data filed at 06/22/2021 0552 Gross per 24 hour  Intake 800 ml  Output 3325 ml  Net -2525 ml    Filed Weights   06/18/21 2205  Weight: 113.4 kg    Examination:  General exam: Appears calm and comfortable, awake alert oriented x4, no acute distress Respiratory system: Bilateral rhonchi. Cardiovascular system: S1 & S2 heard, RRR. No JVD, murmurs, rubs, gallops or clicks. No pedal edema. Gastrointestinal system: Abdomen is nondistended, soft and nontender. No organomegaly or masses felt. Normal bowel sounds heard. Central nervous system: Alert and oriented.  Chronic bilateral lower extremity weakness. Extremities: Symmetric 5 x 5 power bilateral upper extremities, 1 out of 5 lower extremity strength equal bilaterally chronic. Skin: No rashes, lesions or ulcers Psychiatry: Judgement and insight appear normal. Mood & affect appropriate.   Data Reviewed: I have personally reviewed following labs and imaging  studies  CBC: Recent Labs  Lab 06/18/21 2237 06/18/21 2341 06/20/21 0349 06/21/21 0222 06/22/21 0223  WBC 12.7*  --  6.3 5.1 5.2  NEUTROABS 10.6*  --   --   --   --   HGB 14.0 13.3 10.7* 10.1* 11.3*  HCT 42.6 39.0 32.5* 30.6* 33.6*  MCV 95.5  --  96.7 94.7 92.3  PLT 149*  --  PLATELET CLUMPS NOTED ON SMEAR, COUNT APPEARS ADEQUATE PLATELETS APPEAR INCREASED 128*    Basic Metabolic Panel: Recent Labs  Lab 06/18/21 2237 06/18/21 2341 06/20/21 0349 06/21/21 0222 06/22/21 0223  NA 135 136 140 140 139  K 3.7 3.8 4.0 3.2* 3.1*  CL 99  --  104 106 102  CO2 24  --  '29 28 29  ' GLUCOSE 120*  --  81 113* 84  BUN 19  --  '23 19 10  ' CREATININE 1.17*  --  0.98 0.64 0.52  CALCIUM 9.1  --  9.0 8.6* 8.7*    GFR: Estimated Creatinine Clearance: 80 mL/min (by C-G formula based on SCr of 0.52 mg/dL).  Liver Function Tests: Recent Labs  Lab 06/18/21 2237  AST 23  ALT 18  ALKPHOS 84  BILITOT 0.9  PROT 6.5  ALBUMIN 2.9*    No results for input(s): LIPASE, AMYLASE in  the last 168 hours. No results for input(s): AMMONIA in the last 168 hours. Coagulation Profile: No results for input(s): INR, PROTIME in the last 168 hours. Cardiac Enzymes: No results for input(s): CKTOTAL, CKMB, CKMBINDEX, TROPONINI in the last 168 hours. BNP (last 3 results) No results for input(s): PROBNP in the last 8760 hours. HbA1C: No results for input(s): HGBA1C in the last 72 hours. CBG: Recent Labs  Lab 06/19/21 1615  GLUCAP 87    Lipid Profile: No results for input(s): CHOL, HDL, LDLCALC, TRIG, CHOLHDL, LDLDIRECT in the last 72 hours. Thyroid Function Tests: No results for input(s): TSH, T4TOTAL, FREET4, T3FREE, THYROIDAB in the last 72 hours. Anemia Panel: No results for input(s): VITAMINB12, FOLATE, FERRITIN, TIBC, IRON, RETICCTPCT in the last 72 hours. Sepsis Labs: Recent Labs  Lab 06/18/21 0012 06/19/21 0247  LATICACIDVEN 2.5* 1.4     Recent Results (from the past 240 hour(s))   Urine culture     Status: Abnormal   Collection Time: 06/17/21  2:29 AM   Specimen: Urine, Random  Result Value Ref Range Status   Specimen Description URINE, RANDOM  Final   Special Requests   Final    Immunocompromised Performed at [facility], [place] [address], [place], [place] [address]    Culture MULTIPLE SPECIES PRESENT, SUGGEST RECOLLECTION (A)  Final   Report Status 06/20/2021 FINAL  Final  Resp Panel by RT-PCR (Flu A&B, Covid) Nasopharyngeal Swab     Status: None   Collection Time: 06/18/21 11:18 PM   Specimen: Nasopharyngeal Swab; Nasopharyngeal(NP) swabs in vial transport medium  Result Value Ref Range Status   SARS Coronavirus 2 by RT PCR NEGATIVE NEGATIVE Final    Comment: (NOTE) SARS-CoV-2 target nucleic acids are NOT DETECTED.  The SARS-CoV-2 RNA is generally detectable in upper respiratory specimens during the acute phase of infection. The lowest concentration of SARS-CoV-2 viral copies this assay can detect is 138 copies/mL. A negative result does not preclude SARS-Cov-2 infection and should not be used as the sole basis for treatment or other patient management decisions. A negative result may occur with  improper specimen collection/handling, submission of specimen other than nasopharyngeal swab, presence of viral mutation(s) within the areas targeted by this assay, and inadequate number of viral copies(<138 copies/mL). A negative result must be combined with clinical observations, patient history, and epidemiological information. The expected result is Negative.  Fact Sheet for Patients:  EntrepreneurPulse.com.au  Fact Sheet for Healthcare Providers:  IncredibleEmployment.be  This test is no t yet approved or cleared by the [place] FDA and  has been authorized for detection and/or diagnosis of SARS-CoV-2 by FDA under an Emergency Use Authorization (EUA). This EUA will remain  in effect (meaning this test can  be used) for the duration of the COVID-19 declaration under Section 564(b)(1) of the Act, 21 U.S.C.section 360bbb-3(b)(1), unless the authorization is terminated  or revoked sooner.       Influenza A by PCR NEGATIVE NEGATIVE Final   Influenza B by PCR NEGATIVE NEGATIVE Final    Comment: (NOTE) The Xpert Xpress SARS-CoV-2/FLU/RSV plus assay is intended as an aid in the diagnosis of influenza from Nasopharyngeal swab specimens and should not be used as a sole basis for treatment. Nasal washings and aspirates are unacceptable for Xpert Xpress SARS-CoV-2/FLU/RSV testing.  Fact Sheet for Patients: EntrepreneurPulse.com.au  Fact Sheet for Healthcare Providers: IncredibleEmployment.be  This test is not yet approved or cleared by the [place] FDA and has been authorized for detection and/or  diagnosis of SARS-CoV-2 by FDA under an Emergency Use Authorization (EUA). This EUA will remain in effect (meaning this test can be used) for the duration of the COVID-19 declaration under Section 564(b)(1) of the Act, 21 U.S.C. section 360bbb-3(b)(1), unless the authorization is terminated or revoked.  Performed at [facility], [place] [address] [place], [place] [address]   Blood culture (routine x 2)     Status: None (Preliminary result)   Collection Time: 06/19/21 12:01 AM   Specimen: BLOOD  Result Value Ref Range Status   Specimen Description BLOOD RIGHT ANTECUBITAL  Final   Special Requests   Final    BOTTLES DRAWN AEROBIC AND ANAEROBIC Blood Culture adequate volume   Culture   Final    NO GROWTH 2 DAYS Performed at [facility], [place] [address], [place], [place] [address]    Report Status PENDING  Incomplete  MRSA Next Gen by PCR, Nasal     Status: None   Collection Time: 06/19/21  5:37 AM   Specimen: Nasal Mucosa; Nasal Swab  Result Value Ref Range Status   MRSA by PCR Next Gen NOT DETECTED NOT DETECTED Final    Comment: (NOTE) The  GeneXpert MRSA Assay (FDA approved for NASAL specimens only), is one component of a comprehensive MRSA colonization surveillance program. It is not intended to diagnose MRSA infection nor to guide or monitor treatment for MRSA infections. Test performance is not FDA approved in patients less than 61 years old. Performed at [facility], [place] [address] [place], [place] [address]   Blood culture (routine x 2)     Status: None (Preliminary result)   Collection Time: 06/19/21  6:57 AM   Specimen: BLOOD  Result Value Ref Range Status   Specimen Description BLOOD SITE NOT SPECIFIED  Final   Special Requests   Final    BOTTLES DRAWN AEROBIC AND ANAEROBIC Blood Culture results may not be optimal due to an inadequate volume of blood received in culture bottles   Culture   Final    NO GROWTH 2 DAYS Performed at [facility], [place] [address], [place], [place] [address]    Report Status PENDING  Incomplete  Culture, Urine     Status: None   Collection Time: 06/19/21  9:24 PM   Specimen: Urine, Random  Result Value Ref Range Status   Specimen Description URINE, RANDOM  Final   Special Requests NONE  Final   Culture   Final    NO GROWTH Performed at [facility], [place] [address] [place], [place] [address]    Report Status 06/21/2021 FINAL  Final      Radiology Studies: No results found.  Scheduled Meds:  atorvastatin  20 mg Oral Once per day on Sun Tue Thu Sat   levothyroxine  75 mcg Oral Q0600   polyethylene glycol  17 g Oral BID   rivaroxaban  20 mg Oral Q supper   Continuous Infusions:  ceFEPime (MAXIPIME) IV 2 g (06/22/21 0228)     LOS: 3 days   Time spent: 31mn  Lucciano Vitali C Kae Lauman, DO [facility]  If 7PM-7AM, please contact night-coverage [email or web address]  06/22/2021, 7:40 AM

## 2021-06-22 NOTE — Progress Notes (Signed)
Pharmacy Antibiotic Note  Joanne Klein is a 75 y.o. female admitted on 06/18/2021 with sepsis secondary to CAP.  Pharmacy has been consulted for Cefepime dosing. Vancomycin was discontinued 6/29 due to negative nasal MRSA PCR.   She is on day 4 of cefepime. Received two doses of vancomycin on 6/29. Renal function is stable. She is afebrile.   Plan: Continue cefepime 2 grams IV Q12h  Follow up length of therapy  Follow up culture data  Follow up change to PO    Height: 5\' 6"  (167.6 cm) Weight: 113.4 kg (250 lb) IBW/kg (Calculated) : 59.3  Temp (24hrs), Avg:98.4 F (36.9 C), Min:97.8 F (36.6 C), Max:98.9 F (37.2 C)  Recent Labs  Lab 06/18/21 0012 06/18/21 2237 06/19/21 0247 06/20/21 0349 06/21/21 0222 06/22/21 0223  WBC  --  12.7*  --  6.3 5.1 5.2  CREATININE  --  1.17*  --  0.98 0.64 0.52  LATICACIDVEN 2.5*  --  1.4  --   --   --     Estimated Creatinine Clearance: 80 mL/min (by C-G formula based on SCr of 0.52 mg/dL).    Allergies  Allergen Reactions   Prozac [Fluoxetine Hcl] Anaphylaxis   Codeine Itching   Morphine And Related Itching   Sulfa Antibiotics Itching and Nausea Only    Antimicrobials this admission: 6/29 Cefepime >>  6/29 Vancomycin >> 6/29  Microbiology results: 6/29 BCx: ngtd 6/27 UCx:  multiple species, 6/29 repeat with no growth   Thank you for allowing pharmacy to be a part of this patient's care.  Eddie Candle, PharmD, BCPS  Clinical Pharmacist

## 2021-06-22 NOTE — Evaluation (Signed)
Physical Therapy Evaluation Patient Details Name: Joanne Klein MRN: [id number] DOB: 11-Oct-1946 Today's Date: 06/22/2021   History of Present Illness  Pt adm 6/28 with AMS. Pt found to have PNA and UTI. Pt has been receiving hospice services at home. PMH - anal CA, LE myopathy/weakness, uterine CA, DVT, PE, HTN, morbid obesity, OSA, copd, chronic back pain, depression.  Clinical Impression  Pt presents to PT at baseline level of functioning. Pt bed bound at home and only gets OOB for medical appointments. Her neighbor and niece assisting have been assisting her. She doesn't require further skilled PT due to poor rehab potential. If pt reaches the point of needing more assist than she has available would need to be custodial type care.     Follow Up Recommendations No PT follow up    Equipment Recommendations  None recommended by PT (Pt is having electric w/c delivered soon)    Recommendations for Other Services       Precautions / Restrictions Precautions Precautions: Fall      Mobility  Bed Mobility Overal bed mobility: Needs Assistance Bed Mobility: Supine to Sit;Sit to Supine     Supine to sit: Mod assist;HOB elevated Sit to supine: Mod assist;HOB elevated   General bed mobility comments: Assist to bring legs off of bed and pt uses my hand to pull trunk up into sitting. Assist to bring legs back into the bed returning to supine. Pt achieved sitting but didn't scoot to the EOB due to pain    Transfers                    Ambulation/Gait                Stairs            Wheelchair Mobility    Modified Rankin (Stroke Patients Only)       Balance Overall balance assessment: Needs assistance Sitting-balance support: Bilateral upper extremity supported;Feet supported Sitting balance-Leahy Scale: Fair Sitting balance - Comments: If pt could have gotten all the way to EOB she would not have needed UE support                                      Pertinent Vitals/Pain Pain Assessment: Faces Faces Pain Scale: Hurts even more Pain Location: anus Pain Descriptors / Indicators: Pressure Pain Intervention(s): Repositioned;Patient requesting pain meds-RN notified    Home Living Family/patient expects to be discharged to:: Private residence Living Arrangements: Alone Available Help at Discharge: Family;Neighbor;Available PRN/intermittently Type of Home: House Home Access: Ramped entrance     Home Layout: One level Home Equipment: Clinical cytogeneticist - 4 wheels;Cane - single point;Hospital bed;Wheelchair - manual      Prior Function Level of Independence: Needs assistance   Gait / Transfers Assistance Needed: bed bound except to go to MD appts. Niece assist with sliding board to w/c and she takes medical transport for those  ADL's / Homemaking Assistance Needed: Neighbor brings her meals. Pt uses diaper in bed and then cleans herself up.        Hand Dominance        Extremity/Trunk Assessment   Upper Extremity Assessment Upper Extremity Assessment: Overall WFL for tasks assessed    Lower Extremity Assessment Lower Extremity Assessment: RLE deficits/detail;LLE deficits/detail RLE Deficits / Details: Strength <2/5 throughout LLE Deficits / Details: Strength <2/5 throughout  Communication   Communication: No difficulties  Cognition Arousal/Alertness: Awake/alert Behavior During Therapy: WFL for tasks assessed/performed Overall Cognitive Status: Within Functional Limits for tasks assessed                                        General Comments      Exercises     Assessment/Plan    PT Assessment Patent does not need any further PT services  PT Problem List         PT Treatment Interventions      PT Goals (Current goals can be found in the Care Plan section)  Acute Rehab PT Goals Patient Stated Goal: return home PT Goal Formulation: All assessment and education  complete, DC therapy    Frequency     Barriers to discharge        Co-evaluation               AM-PAC PT "6 Clicks" Mobility  Outcome Measure Help needed turning from your back to your side while in a flat bed without using bedrails?: A Lot Help needed moving from lying on your back to sitting on the side of a flat bed without using bedrails?: A Lot Help needed moving to and from a bed to a chair (including a wheelchair)?: Total Help needed standing up from a chair using your arms (e.g., wheelchair or bedside chair)?: Total Help needed to walk in hospital room?: Total Help needed climbing 3-5 steps with a railing? : Total 6 Click Score: 8    End of Session Equipment Utilized During Treatment: Oxygen Activity Tolerance: Patient limited by pain Patient left: in bed;with call bell/phone within reach Nurse Communication: Patient requests pain meds PT Visit Diagnosis: Other abnormalities of gait and mobility (R26.89)    Time: 1141-1157 PT Time Calculation (min) (ACUTE ONLY): 16 min   Charges:   PT Evaluation $PT Eval Low Complexity: 1 Low          [place] Pager [phone] Office [place] 06/22/2021, 1:03 PM

## 2021-06-23 LAB — BASIC METABOLIC PANEL
Anion gap: 8 (ref 5–15)
BUN: 9 mg/dL (ref 8–23)
CO2: 33 mmol/L — ABNORMAL HIGH (ref 22–32)
Calcium: 8.8 mg/dL — ABNORMAL LOW (ref 8.9–10.3)
Chloride: 96 mmol/L — ABNORMAL LOW (ref 98–111)
Creatinine, Ser: 0.65 mg/dL (ref 0.44–1.00)
GFR, Estimated: >60 mL/min (ref >60)
Glucose, Bld: 89 mg/dL (ref 70–99)
Potassium: 3 mmol/L — ABNORMAL LOW (ref 3.5–5.1)
Sodium: 137 mmol/L (ref 135–145)

## 2021-06-23 LAB — CBC
HCT: 36.3 % (ref 36.0–46.0)
Hemoglobin: 12.3 g/dL (ref 12.0–15.0)
MCH: 30.9 pg (ref 26.0–34.0)
MCHC: 33.9 g/dL (ref 30.0–36.0)
MCV: 91.2 fL (ref 80.0–100.0)
Platelets: 144 10*3/uL — ABNORMAL LOW (ref 150–400)
RBC: 3.98 MIL/uL (ref 3.87–5.11)
RDW: 12.8 % (ref 11.5–15.5)
WBC: 6 10*3/uL (ref 4.0–10.5)
nRBC: 0 % (ref 0.0–0.2)

## 2021-06-23 MED ORDER — SODIUM CHLORIDE 0.9 % IV SOLN
6.2500 mg | Freq: Three times a day (TID) | INTRAVENOUS | Status: DC | PRN
  Administered 2021-06-23: 6.25 mg via INTRAVENOUS
  Filled 2021-06-23 (×2): qty 0.25

## 2021-06-23 MED ORDER — LORAZEPAM 0.5 MG PO TABS
0.5000 mg | ORAL_TABLET | Freq: Once | ORAL | Status: DC
  Filled 2021-06-23: qty 1

## 2021-06-23 NOTE — Progress Notes (Signed)
[id number] - [facility] [id number] [facility]) hospitalized hospice patient.   This patient is a current [facility] hospice patient with a terminal diagnosis of anal cancer. Hospice nurse attempted to visit today and patient was not home. Contacted family member who reported patient was transported to ED for evaluation. Per ED notes patient had been more drowsy and confused. EMS was activated and patient was transported to [facility] ED. Patient was admitted to [facility] on 6.29.2022 with a diagnosis of pneumonia and sepsis. Per Dr. Tomasa Hosteller with [facility] this is a related hospital admission.   Visited patient at bedside and exchanged report with bedside RN and NT.  RN reports pt has had 2-3 good BM's today.  Pt continues to endorse nausea but reports is better managed with phenergan. 25% of lunch tray eaten; pt reports little to no appetite.  Pt appears more alert and in no SOB on this visit.  NO new concerns at this time.   This patient is appropriate for appropriate for inpatient services due to ongoing IV therapies for antiemetics and PRN meds to assess for comfort.  VS: 98.4 oral, 180/90 (115), 84 HR, 20 RR,  98% RA  I/O: 440/1200   Abnormal Labs:  K 3.0,  Cl 96, CO2 33, Ca 8.8,  platelets 144k  Diagnostics: No new imaging   IV/PRN Medications: Phenergan, 6.25 mg q8h PIV Zofran 4mg  IV  q6h PRN nausea/vomiting x 2 doses /24 h Oxycodone-acetaminophen 5-325 mg 1-2 tabs PO PRN moderate pain (5 doses of 2 tabs given in 24 hours)   Problem list:   Principal Problem:   Pneumonia Active Problems:   UTI (urinary tract infection)   Severe sepsis (HCC)   Acute hypoxemic respiratory failure (HCC)   Elevated troponin   GOALS OF CARE -Lengthy discussion with daughter previously, patient already following with hospice, at this time we will continue aggressive care for her likely to me acquired pneumonia and UTI sepsis but should she worsen we have discussed transitioning to palliative care. -Patient thankfully more awake  and alert this morning as we wean down her narcotics, she remains optimistic about being discharged home with extra support from family and hospice. We discussed involving hospice care at home versus transitioning to hospice house. Patient was less inclined to transition to hospice house unless absolutely necessary.   Severe sepsis secondary to community - acquired pneumonia and UTI, resolving - Blood pressure improving, borderline elevated in the setting of pain secondary to below - Continue broad-spectrum antibiotics with cefepime, vancomycin discontinued given MRSA swabs negative - Continue to follow closely, patient is DNR, recently evaluated by hospice due to metastatic cancer diagnosis as below.   Acute hypoxemic respiratory failure secondary to community-acquired pneumonia/sepsis - Secondary to above severe sepsis, improving slowly daily - Continue antibiotics as above, wean oxygen as tolerated, incentive spirometry and flutter also offered today. - Hold off on steroids given this does not appear to be radiation pneumonitis per discussion with radiation oncology given its location and diffuse pattern.   Elevated troponin in the setting of above - EKG negative, without chest pain. - Likely supply demand mismatch in the setting of profound hypoxia and severe sepsis  Discharge Planning: Return home with hospice services when stable for discharge.     Family Contact: Spoke with niece, Bernadette Hoit, and updated her. No new concerns at this time, shares plan to visit pt this evening.   IDT: Updated   Goals of Care: Clear. Treat for current infection and maintain comfort. DNR  Medication lists and transfer summary placed on shadow chart.    Should patient need ambulance transport at discharge please use [facility].   Please do not hesitate to call with questions.   Thank you for the opportunity to participate in this patient's care.  Domenic Moras, BSN, RN     [facility] Liaison [phone] (24h on call)

## 2021-06-23 NOTE — Progress Notes (Signed)
PROGRESS NOTE    Joanne Klein  ATF:[id number] DOB: 04-01-1946 DOA: 06/18/2021 PCP: Hoyt Koch, MD   Brief Narrative:   Joanne Klein is a 75 y.o. 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  Patient's niece reported that a neighbor came to check on the patient around 3 PM and noted that she was more drowsy but when the patient's daughter came to check on her around 8:30 PM she was found confused.  She is typically alert and oriented x3 at baseline.  Family felt that her extremities were cool to touch.  Upon EMS arrival, oxygen saturation was 79% on room air and she was placed on 10 L oxygen via nonrebreather.  Family also reported a productive cough.  Patient just recently underwent biopsy due to concern for recurrence of anal cancer which shows squamous cell characteristics - family reported that they met with hospice and signed a DNR.    Assessment & Plan:   Principal Problem:   Pneumonia Active Problems:   UTI (urinary tract infection)   Severe sepsis (HCC)   Acute hypoxemic respiratory failure (HCC)   Elevated troponin  GOALS OF CARE -Lengthy discussion with daughter previously, patient already following with hospice, at this time we will continue aggressive care for her likely to me acquired pneumonia and UTI sepsis but should she worsen we have discussed transitioning to palliative care. -Patient thankfully more awake and alert this morning as we wean down her narcotics, she remains optimistic  about being discharged home with extra support from family and hospice. We discussed involving hospice care at home versus transitioning to hospice house. Patient was less inclined to transition to hospice house unless absolutely necessary.   Severe sepsis secondary to community - acquired pneumonia and UTI, resolving - Blood pressure improving, borderline elevated in the setting of pain secondary to below - Continue broad-spectrum antibiotics with cefepime, vancomycin discontinued given MRSA swabs negative - Continue to follow closely, patient is DNR, recently evaluated by hospice due to metastatic cancer diagnosis as below.   Acute hypoxemic respiratory failure secondary to community-acquired pneumonia/sepsis - Secondary to above severe sepsis, improving slowly daily - Continue antibiotics as above, wean oxygen as tolerated, incentive spirometry and flutter also offered today. - Hold off on steroids given this does not appear to be radiation pneumonitis per discussion with radiation oncology given its location and diffuse pattern.   Elevated troponin in the setting of above - EKG negative, without chest pain. - Likely supply demand mismatch in the setting of profound hypoxia and severe sepsis.   AKI, resolved -Secondary to above, resolving with increased p.o. intake  Acute metabolic encephalopathy, resolving - Patient appears to be closer to baseline today, ANO x4 more appropriate discussion and history - Cannot rule out polypharmacy given patient was on multiple narcotics prior to admission   Anal squamous cell carcinoma Intractable pain, improving -Continue to follow outpatient with oncology -Transition from methadone to Percocet for pain control -patient previously on both but appears to tolerate poorly given mental status changes and polypharmacy as above   History of DVT and PE -  Continue Xarelto  Hyperlipidemia - Continue statin  Hypothyroidism - Continue Synthroid    Chronic back pain -Transition to Percocet, methadone discontinued   DVT prophylaxis: Xarelto Code Status: DNR Family Communication: Niece Andi updated over the phone.  Status is: Inpt  Dispo: The patient is from: Home             Anticipated d/c is to: Home versus SNF             Anticipated d/c date is: 24-48h             Patient currently NOT medically stable for discharge  Consultants:  None  Procedures:  None  Antimicrobials:  Cefepime ongoing Vancomycin discontinued  Subjective: No acute issues/events overnight, mental status improving today, pain and nausea poorly controlled today.  Objective: Vitals:   06/22/21 2010 06/23/21 0009 06/23/21 0431 06/23/21 0728  BP: (!) 184/98  (!) 191/98 (!) 180/90  Pulse: 84     Resp:      Temp: 98.3 F (36.8 C) 98.3 F (36.8 C) 98.3 F (36.8 C) 98.4 F (36.9 C)  TempSrc: Oral Oral Oral Oral  SpO2: 95%   98%  Weight:      Height:        Intake/Output Summary (Last 24 hours) at 06/23/2021 2449 Last data filed at 06/22/2021 2010 Gross per 24 hour  Intake 440 ml  Output 1200 ml  Net -760 ml    Filed Weights   06/18/21 2205  Weight: 113.4 kg    Examination:  General exam: Somnolent but easily arousable in no acute distress Respiratory system: Bilateral rhonchi. Cardiovascular system: S1 & S2 heard, RRR. No JVD, murmurs, rubs, gallops or clicks. No pedal edema. Gastrointestinal system: Abdomen is nondistended, soft and nontender. No organomegaly or masses felt. Normal bowel sounds heard. Central nervous system: Alert and oriented.  Chronic bilateral lower extremity weakness. Extremities: Symmetric 5 x 5 power bilateral upper extremities, 1 out of 5 lower extremity strength equal bilaterally chronic. Skin: No rashes, lesions or ulcers Psychiatry: Judgement and insight appear normal. Mood & affect appropriate.   Data Reviewed: I have personally reviewed following labs and imaging studies  CBC: Recent Labs  Lab  06/18/21 2237 06/18/21 2341 06/20/21 0349 06/21/21 0222 06/22/21 0223 06/23/21 0451  WBC 12.7*  --  6.3 5.1 5.2 6.0  NEUTROABS 10.6*  --   --   --   --   --   HGB 14.0 13.3 10.7* 10.1* 11.3* 12.3  HCT 42.6 39.0 32.5* 30.6* 33.6* 36.3  MCV 95.5  --  96.7 94.7 92.3 91.2  PLT 149*  --  PLATELET CLUMPS NOTED ON SMEAR, COUNT APPEARS ADEQUATE PLATELETS APPEAR INCREASED 128* 144*    Basic Metabolic Panel: Recent Labs  Lab 06/18/21 2237 06/18/21 2341 06/20/21 0349 06/21/21 0222 06/22/21 0223 06/23/21 0451  NA 135 136 140 140 139 137  K 3.7 3.8 4.0 3.2* 3.1* 3.0*  CL 99  --  104 106 102 96*  CO2 24  --  _0 33*  GLUCOSE 120*  --  81 113* 84 89  BUN 19  --  _1 CREATININE 1.17*  --  0.98 0.64 0.52 0.65  CALCIUM 9.1  --  9.0 8.6* 8.7* 8.8*    GFR: Estimated Creatinine Clearance: 80 mL/min (by C-G formula based on SCr of 0.65 mg/dL).  Liver Function Tests: Recent Labs  Lab 06/18/21 2237  AST 23  ALT 18  ALKPHOS 84  BILITOT  0.9  PROT 6.5  ALBUMIN 2.9*    No results for input(s): LIPASE, AMYLASE in the last 168 hours. No results for input(s): AMMONIA in the last 168 hours. Coagulation Profile: No results for input(s): INR, PROTIME in the last 168 hours. Cardiac Enzymes: No results for input(s): CKTOTAL, CKMB, CKMBINDEX, TROPONINI in the last 168 hours. BNP (last 3 results) No results for input(s): PROBNP in the last 8760 hours. HbA1C: No results for input(s): HGBA1C in the last 72 hours. CBG: Recent Labs  Lab 06/19/21 1615  GLUCAP 87    Lipid Profile: No results for input(s): CHOL, HDL, LDLCALC, TRIG, CHOLHDL, LDLDIRECT in the last 72 hours. Thyroid Function Tests: No results for input(s): TSH, T4TOTAL, FREET4, T3FREE, THYROIDAB in the last 72 hours. Anemia Panel: No results for input(s): VITAMINB12, FOLATE, FERRITIN, TIBC, IRON, RETICCTPCT in the last 72 hours. Sepsis Labs: Recent Labs  Lab 06/18/21 0012 06/19/21 0247  LATICACIDVEN 2.5* 1.4      Recent Results (from the past 240 hour(s))  Urine culture     Status: Abnormal   Collection Time: 06/17/21  2:29 AM   Specimen: Urine, Random  Result Value Ref Range Status   Specimen Description URINE, RANDOM  Final   Special Requests   Final    Immunocompromised Performed at [facility], [place] [address] [place], [place] [address]    Culture MULTIPLE SPECIES PRESENT, SUGGEST RECOLLECTION (A)  Final   Report Status 06/20/2021 FINAL  Final  Resp Panel by RT-PCR (Flu A&B, Covid) Nasopharyngeal Swab     Status: None   Collection Time: 06/18/21 11:18 PM   Specimen: Nasopharyngeal Swab; Nasopharyngeal(NP) swabs in vial transport medium  Result Value Ref Range Status   SARS Coronavirus 2 by RT PCR NEGATIVE NEGATIVE Final    Comment: (NOTE) SARS-CoV-2 target nucleic acids are NOT DETECTED.  The SARS-CoV-2 RNA is generally detectable in upper respiratory specimens during the acute phase of infection. The lowest concentration of SARS-CoV-2 viral copies this assay can detect is 138 copies/mL. A negative result does not preclude SARS-Cov-2 infection and should not be used as the sole basis for treatment or other patient management decisions. A negative result may occur with  improper specimen collection/handling, submission of specimen other than nasopharyngeal swab, presence of viral mutation(s) within the areas targeted by this assay, and inadequate number of viral copies(<138 copies/mL). A negative result must be combined with clinical observations, patient history, and epidemiological information. The expected result is Negative.  Fact Sheet for Patients:  EntrepreneurPulse.com.au  Fact Sheet for Healthcare Providers:  IncredibleEmployment.be  This test is no t yet approved or cleared by the [place] FDA and  has been authorized for detection and/or diagnosis of SARS-CoV-2 by FDA under an Emergency Use Authorization (EUA). This  EUA will remain  in effect (meaning this test can be used) for the duration of the COVID-19 declaration under Section 564(b)(1) of the Act, 21 U.S.C.section 360bbb-3(b)(1), unless the authorization is terminated  or revoked sooner.       Influenza A by PCR NEGATIVE NEGATIVE Final   Influenza B by PCR NEGATIVE NEGATIVE Final    Comment: (NOTE) The Xpert Xpress SARS-CoV-2/FLU/RSV plus assay is intended as an aid in the diagnosis of influenza from Nasopharyngeal swab specimens and should not be used as a sole basis for treatment. Nasal washings and aspirates are unacceptable for Xpert Xpress SARS-CoV-2/FLU/RSV testing.  Fact Sheet for Patients: EntrepreneurPulse.com.au  Fact Sheet for Healthcare Providers: IncredibleEmployment.be  This test is  not yet approved or cleared by the [place] and has been authorized for detection and/or diagnosis of SARS-CoV-2 by FDA under an Emergency Use Authorization (EUA). This EUA will remain in effect (meaning this test can be used) for the duration of the COVID-19 declaration under Section 564(b)(1) of the Act, 21 U.S.C. section 360bbb-3(b)(1), unless the authorization is terminated or revoked.  Performed at [facility], [place] [address] [place], [place] [address]   Blood culture (routine x 2)     Status: None (Preliminary result)   Collection Time: 06/19/21 12:01 AM   Specimen: BLOOD  Result Value Ref Range Status   Specimen Description BLOOD RIGHT ANTECUBITAL  Final   Special Requests   Final    BOTTLES DRAWN AEROBIC AND ANAEROBIC Blood Culture adequate volume   Culture   Final    NO GROWTH 3 DAYS Performed at [facility], [place] [address], [place], [place] [address]    Report Status PENDING  Incomplete  MRSA Next Gen by PCR, Nasal     Status: None   Collection Time: 06/19/21  5:37 AM   Specimen: Nasal Mucosa; Nasal Swab  Result Value Ref Range Status   MRSA by PCR Next Gen NOT  DETECTED NOT DETECTED Final    Comment: (NOTE) The GeneXpert MRSA Assay (FDA approved for NASAL specimens only), is one component of a comprehensive MRSA colonization surveillance program. It is not intended to diagnose MRSA infection nor to guide or monitor treatment for MRSA infections. Test performance is not FDA approved in patients less than 73 years old. Performed at [facility], [place] [address], [place], [place] [address]   Blood culture (routine x 2)     Status: None (Preliminary result)   Collection Time: 06/19/21  6:57 AM   Specimen: BLOOD  Result Value Ref Range Status   Specimen Description BLOOD SITE NOT SPECIFIED  Final   Special Requests   Final    BOTTLES DRAWN AEROBIC AND ANAEROBIC Blood Culture results may not be optimal due to an inadequate volume of blood received in culture bottles   Culture   Final    NO GROWTH 3 DAYS Performed at [facility], [place] [address], [place], [place] [address]    Report Status PENDING  Incomplete  Culture, Urine     Status: None   Collection Time: 06/19/21  9:24 PM   Specimen: Urine, Random  Result Value Ref Range Status   Specimen Description URINE, RANDOM  Final   Special Requests NONE  Final   Culture   Final    NO GROWTH Performed at [facility], [place] [address] [place], [place] [address]    Report Status 06/21/2021 FINAL  Final      Radiology Studies: No results found.  Scheduled Meds:  atorvastatin  20 mg Oral Once per day on Sun Tue Thu Sat   levothyroxine  75 mcg Oral Q0600   polyethylene glycol  17 g Oral BID   rivaroxaban  20 mg Oral Q supper   Continuous Infusions:  ceFEPime (MAXIPIME) IV 2 g (06/22/21 2143)     LOS: 4 days   Time spent: 69mn  Johnhenry Tippin C Keonia Pasko, DO [facility]  If 7PM-7AM, please contact night-coverage [email or web address]  06/23/2021, 8:07 AM

## 2021-06-24 LAB — BASIC METABOLIC PANEL
Anion gap: 6 (ref 5–15)
BUN: 13 mg/dL (ref 8–23)
CO2: 35 mmol/L — ABNORMAL HIGH (ref 22–32)
Calcium: 8.9 mg/dL (ref 8.9–10.3)
Chloride: 97 mmol/L — ABNORMAL LOW (ref 98–111)
Creatinine, Ser: 0.75 mg/dL (ref 0.44–1.00)
GFR, Estimated: >60 mL/min (ref >60)
Glucose, Bld: 84 mg/dL (ref 70–99)
Potassium: 3.3 mmol/L — ABNORMAL LOW (ref 3.5–5.1)
Sodium: 138 mmol/L (ref 135–145)

## 2021-06-24 LAB — CBC
HCT: 37.5 % (ref 36.0–46.0)
Hemoglobin: 12.8 g/dL (ref 12.0–15.0)
MCH: 31.5 pg (ref 26.0–34.0)
MCHC: 34.1 g/dL (ref 30.0–36.0)
MCV: 92.4 fL (ref 80.0–100.0)
Platelets: 120 10*3/uL — ABNORMAL LOW (ref 150–400)
RBC: 4.06 MIL/uL (ref 3.87–5.11)
RDW: 12.9 % (ref 11.5–15.5)
WBC: 6.6 10*3/uL (ref 4.0–10.5)
nRBC: 0 % (ref 0.0–0.2)

## 2021-06-24 LAB — CULTURE, BLOOD (ROUTINE X 2)
Culture: NO GROWTH
Culture: NO GROWTH
Special Requests: ADEQUATE

## 2021-06-24 NOTE — Progress Notes (Signed)
HEMATOLOGY-ONCOLOGY PROGRESS NOTE  SUBJECTIVE: Joanne Klein reports adequate pain control with oxycodone.  She complains of dyspnea.  She plans to return to her home with hospice care.  Oncology History  Anal cancer ([place])  09/15/2019 Initial Diagnosis   Anal cancer ([place])    10/03/2019 -  Chemotherapy   The patient had mitoMYcin (MUTAMYCIN) chemo injection 18 mg, 8 mg/m2 = 18 mg (100 % of original dose 8 mg/m2), Intravenous,  Once, 1 of 1 cycle Dose modification: 8 mg/m2 (original dose 8 mg/m2, Cycle 1, Reason: Provider Judgment) Administration: 18 mg (10/03/2019) fluorouracil (ADRUCIL) 8,900 mg in sodium chloride 0.9 % 72 mL chemo infusion, 1,000 mg/m2/day = 8,900 mg, Intravenous, 4D (96 hours ), 1 of 1 cycle Dose modification: 300 mg/m2/day (original dose 1,000 mg/m2/day, Cycle 1, Reason: Provider Judgment) Administration: 8,900 mg (10/03/2019), 2,650 mg (11/07/2019)   for chemotherapy treatment.      PHYSICAL EXAMINATION:  Vitals:   06/24/21 0315 06/24/21 0720  BP: (!) 162/99 (!) 170/82  Pulse: 85 79  Resp: 15 16  Temp: 98.2 F (36.8 C) 98.3 F (36.8 C)  SpO2: 99% 98%   Filed Weights   06/18/21 2205  Weight: 250 lb (113.4 kg)    Intake/Output from previous day: No intake/output data recorded.  LUNGS: Rhonchi at the left lower posterior chest, no respiratory distress HEART: regular rate & rhythm and no murmurs and no lower extremity edema ABDOMEN:abdomen soft, non-tender and normal bowel sounds Musculoskeletal: Able to move legs minimally, cannot lift legs off bed NEURO: Alert and oriented  LABORATORY DATA:  I have reviewed the data as listed CMP Latest Ref Rng & Units 06/24/2021 06/23/2021 06/22/2021  Glucose 70 - 99 mg/dL 84 89 84  BUN 8 - 23 mg/dL 13 9 10   Creatinine 0.44 - 1.00 mg/dL 0.75 0.65 0.52  Sodium 135 - 145 mmol/L 138 137 139  Potassium 3.5 - 5.1 mmol/L 3.3(L) 3.0(L) 3.1(L)  Chloride 98 - 111 mmol/L 97(L) 96(L) 102  CO2 22 - 32 mmol/L 35(H) 33(H) 29   Calcium 8.9 - 10.3 mg/dL 8.9 8.8(L) 8.7(L)  Total Protein 6.5 - 8.1 g/dL - - -  Total Bilirubin 0.3 - 1.2 mg/dL - - -  Alkaline Phos 38 - 126 U/L - - -  AST 15 - 41 U/L - - -  ALT 0 - 44 U/L - - -    Lab Results  Component Value Date   WBC 6.6 06/24/2021   HGB 12.8 06/24/2021   HCT 37.5 06/24/2021   MCV 92.4 06/24/2021   PLT 120 (L) 06/24/2021   NEUTROABS 10.6 (H) 06/18/2021    DG Chest Port 1 View  Result Date: 06/18/2021 CLINICAL DATA:  Dyspnea, altered mental status EXAM: PORTABLE CHEST 1 VIEW COMPARISON:  09/27/2020 FINDINGS: Lung volumes are small. Left basilar focal pulmonary infiltrate has developed, possibly infectious in the appropriate clinical setting. No pneumothorax. Tiny left pleural effusion. Cardiac size within normal limits. No acute bone abnormality. IMPRESSION: Left basilar focal pulmonary infiltrate, possibly infectious, with associated tiny left parapneumonic effusion. Electronically Signed   By: Fidela Salisbury MD   On: 06/18/2021 22:59    ASSESSMENT AND PLAN: Squamous cell carcinoma of the anal margin Biopsy 08/30/2019 confirmed well-differentiated squamous cell carcinoma with positive P 16 and p63 stains CTs 09/14/2019- abnormal soft tissue fullness at the lower anus/perianal soft tissues, asymmetric left inguinal lymphadenopathy, borderline enlarged left external iliac node, mild stranding and cutaneous thickening of the left gluteal fold, 2 to 3 mm  pulmonary nodules, 1.6 cm right hepatic lesion-likely a hemangioma PET scan 76/06/[id number] hypermetabolic anal mass.  3 hypermetabolic left inguinal lymph nodes.  Left external iliac node with minimally higher than blood pool activity.  1.0 x 0.8 cm left thyroid nodule with activity mildly above background blood pool activity but below liver activity.  No perceptible hypermetabolic activity in the vicinity of the lateral right hepatic lobe lesion seen on the 09/14/2019 CT. Began concurrent chemoRT with 5FU/mitomycin on  10/03/2019 Cycle 2 5-FU, dose reduced, mitomycin held 11/07/2019 Radiation completed 11/24/2019 CT abdomen/pelvis 12/27/2019-extensive left-sided colonic diverticulosis without diverticulitis.  No bowel obstruction.  No abscess in the abdomen or pelvis.  Rectum borderline distended with stool without perirectal wall thickening or soft tissue stranding.  Left inguinal and external iliac lymph nodes now subcentimeter compared to the previous size.  Stable small lesion in the right lobe of the liver.  No new liver lesions evident. Anal exam with biopsy 06/06/2021 -biopsy consistent with invasive moderately to poorly differentiated squamous cell carcinoma COPD Fibromyalgia Chronic back pain Hypothyroid Depression History of uterine cancer-age 4 Pain secondary to #1 Orthostatic hypotension, fatigue, dyspnea on exertion, early mucositis requiring supportive care on day 9, secondary to chemotherapy 10. Thrombocytopenia PLT 58K and neutropenia ANC 1.4 on day 9, secondary to chemotherapy  11. Worsening cytopenias, PLT 8K and ANC 0.0 on day 15, secondary to chemotherapy. Started prophylaxis with cipro on 10/22 day 11 12.  Hospital admission 10/17/2019 -fatigue, pancytopenia, hypokalemia 13.  Left thyroid nodule with activity mildly above background blood pool activity but below liver activity on PET scan 09/27/2019.  Thyroid ultrasound 219 2021-1.6 cm nodule left inferior thyroid gland.  0/08/7352 FNA-benign follicular nodule. 14.  Tiny lung nodules noted on chest CT 09/14/2019 15.  Falls, back/leg pain and leg weakness-CT lumbar spine 05/26/2020 with no acute abnormality within the lumbar spine; multilevel degenerative spondylosis with resultant mild canal with bilateral lateral recess stenosis at L4-5; moderate bilateral L4 and L5 foraminal stenosis related to disc bulge, reactive endplate changes and facet degeneration; soft tissue density/stranding involving the presacral space anterior to the sacrum,  incompletely visualized MRI thoracic and lumbar spine 06/14/2020-no mass or fracture, degenerative changes in the thoracic and lumbar spine MRI brain 06/21/2020-no acute abnormality MRI cervical, thoracic and lumbar spine 06/21/2020-no abnormal cervical cord signal, mild cervical degenerative changes without high-grade stenosis, no significant abnormal enhancement in the thoracolumbar spine. MRI pelvis 06/21/2020-no clearly defined mass or asymmetry in the anal/perianal region or adjacent rectum, mild but new presacral edema within the iliacus muscles, piriformis muscles, central gluteus muscles, and bilateral hip adductor musculature in a symmetric fashion. This is also accompanied by low-grade enhancement. This may reflect myositis, and association with prior regional radiation therapy is not excluded. Trial of prednisone 60 mg daily 06/22/2020, tapered by radiation oncology, dose increased to 40 mg daily 08/06/2020, tapered to 30 mg daily beginning 09/13/2020 16.  Left-sided pulmonary embolism 09/27/2020 Doppler 09/28/2020-age-indeterminate DVT in the right posterior tibial veins 17.  Admission 09/27/2020 with progressive leg weakness 18.  Hospital admission 06/18/2021-sepsis secondary to pneumonia and UTI  Ms. Flury has been admitted due to sepsis from pneumonia and UTI.  She had confusion on hospital admission, likely related to infection and methadone.  Methadone has been discontinued.  She reports adequate pain control with oxycodone at present.  She states that she plans to return home with hospice care.  She does not wish to schedule an office visit due to difficulty with transportation.  I will  be available to help with the home hospice care.  Recommendations: Management of pneumonia/respiratory failure per the medical service Continue oxycodone/APAP as needed for pain Plan for discharge to home with hospice care when medically appropriate   LOS: 5 days   Betsy Coder, MD 06/24/21

## 2021-06-24 NOTE — Progress Notes (Signed)
PROGRESS NOTE    Joanne Klein  PZW:[id number] DOB: 1946-11-28 DOA: 06/18/2021 PCP: Hoyt Koch, MD   Brief Narrative:   Joanne Klein is a 75 y.o. 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  Patient's niece reported that a neighbor came to check on the patient around 3 PM and noted that she was more drowsy but when the patient's daughter came to check on her around 8:30 PM she was found confused.  She is typically alert and oriented x3 at baseline.  Family felt that her extremities were cool to touch.  Upon EMS arrival, oxygen saturation was 79% on room air and she was placed on 10 L oxygen via nonrebreather.  Family also reported a productive cough.  Patient just recently underwent biopsy due to concern for recurrence of anal cancer which shows squamous cell characteristics - family reported that they met with hospice and signed a DNR.  Assessment & Plan: Principal Problem:   Pneumonia Active Problems:   UTI (urinary tract infection)   Severe sepsis (HCC)   Acute hypoxemic respiratory failure (HCC)   Elevated troponin  GOALS OF CARE -Lengthy discussion with daughter previously, patient already following with hospice, at this time we will continue aggressive care for her likely to me acquired pneumonia and UTI sepsis but should she worsen we have discussed transitioning to palliative care. -Patient thankfully more awake and alert this morning as we wean down her narcotics, she remains optimistic about  being discharged home with extra support from family and hospice. We discussed involving hospice care at home versus transitioning to hospice house. Patient was less inclined to transition to hospice house unless absolutely necessary.  Severe sepsis secondary to community - acquired pneumonia and UTI, resolving - Blood pressure improving, borderline elevated in the setting of pain secondary to below - Continue broad-spectrum antibiotics with cefepime, vancomycin discontinued given MRSA swabs negative -patient to complete antibiotics on 06/25/2021 - Continue to follow closely, patient is DNR, recently evaluated by hospice due to metastatic cancer diagnosis as below.  Acute hypoxemic respiratory failure secondary to community-acquired pneumonia/sepsis, resolving - Secondary to above severe sepsis, improving slowly daily - Continue antibiotics as above, wean oxygen as tolerated, incentive spirometry and flutter also offered today. - Hold off on steroids given this does not appear to be radiation pneumonitis per discussion with radiation oncology given its location and diffuse pattern.   Elevated troponin in the setting of above - EKG negative, without chest pain. - Likely supply demand mismatch in the setting of profound hypoxia and severe sepsis.   AKI, resolved -Secondary to above, resolving with increased p.o. intake  Acute metabolic encephalopathy, resolving - Patient appears to be closer to baseline today, ANO x4 more appropriate discussion and history - Cannot rule out polypharmacy given patient was on multiple narcotics prior to admission   Anal squamous cell carcinoma Intractable pain, improving -Continue to follow outpatient with oncology -Transition from methadone to Percocet for pain control -patient previously on both but appears to tolerate poorly given mental status changes and polypharmacy as above   History of DVT and PE -  Continue Xarelto  Hyperlipidemia - Continue  statin  Hypothyroidism - Continue Synthroid   Chronic back pain -Transition to Percocet, methadone discontinued   DVT prophylaxis: Xarelto Code Status: DNR Family Communication: Niece Andi updated over the phone.  Status is: Inpt  Dispo: The patient is from: Home             Anticipated d/c is to: Home versus SNF             Anticipated d/c date is: 24h             Patient currently IS medically stable for discharge, awaiting safe disposition given her transition home with hospice and will likely need to discharge on oxygen, awaiting equipment delivery and electric scooter delivery  Consultants:  None  Procedures:  None  Antimicrobials:  Cefepime ongoing Vancomycin discontinued  Subjective: No acute issues or events overnight, patient still resting poorly, somewhat fixated on her pneumonia which we discussed was resolving and her most of her complaints and issues are likely secondary to her chronic conditions including cancer.  She denies chest pain nausea vomiting headache fevers or chills, still having difficulty defecating due to rectal pain.  Objective: Vitals:   06/23/21 2324 06/24/21 0150 06/24/21 0315 06/24/21 0720  BP: (!) 178/95 (!) 159/93 (!) 162/99 (!) 170/82  Pulse: 88  85 79  Resp: 11 (!) 21 15 16   Temp: 98 F (36.7 C)  98.2 F (36.8 C) 98.3 F (36.8 C)  TempSrc: Oral  Oral Oral  SpO2: 99% 98% 99% 98%  Weight:      Height:       No intake or output data in the 24 hours ending 06/24/21 0755  Filed Weights   06/18/21 2205  Weight: 113.4 kg    Examination:  General exam: Somnolent but easily arousable in no acute distress Respiratory system: Bilateral rhonchi. Cardiovascular system: S1 & S2 heard, RRR. No JVD, murmurs, rubs, gallops or clicks. No pedal edema. Gastrointestinal system: Abdomen is nondistended, soft and nontender. No organomegaly or masses felt. Normal bowel sounds heard. Central nervous system: Alert and oriented.  Chronic bilateral  lower extremity weakness. Extremities: Symmetric 5 x 5 power bilateral upper extremities, 1 out of 5 lower extremity strength equal bilaterally chronic. Skin: No rashes, lesions or ulcers Psychiatry: Judgement and insight appear normal. Mood & affect appropriate.   Data Reviewed: I have personally reviewed following labs and imaging studies  CBC: Recent Labs  Lab 06/18/21 2237 06/18/21 2341 06/20/21 0349 06/21/21 0222 06/22/21 0223 06/23/21 0451 06/24/21 0413  WBC 12.7*  --  6.3 5.1 5.2 6.0 6.6  NEUTROABS 10.6*  --   --   --   --   --   --   HGB 14.0   < > 10.7* 10.1* 11.3* 12.3 12.8  HCT 42.6   < > 32.5* 30.6* 33.6* 36.3 37.5  MCV 95.5  --  96.7 94.7 92.3 91.2 92.4  PLT 149*  --  PLATELET CLUMPS NOTED ON SMEAR, COUNT APPEARS ADEQUATE PLATELETS APPEAR INCREASED 128* 144* 120*   < > = values in this interval not displayed.    Basic Metabolic Panel: Recent Labs  Lab 06/20/21 0349 06/21/21 0222 06/22/21 0223 06/23/21 0451 06/24/21 0413  NA 140 140 139 137 138  K 4.0 3.2* 3.1* 3.0* 3.3*  CL 104 106 102 96* 97*  CO2 29 28 29  33* 35*  GLUCOSE 81 113* 84 89 84  BUN 23 19 10 9 13   CREATININE 0.98 0.64  0.52 0.65 0.75  CALCIUM 9.0 8.6* 8.7* 8.8* 8.9    GFR: Estimated Creatinine Clearance: 80 mL/min (by C-G formula based on SCr of 0.75 mg/dL).  Liver Function Tests: Recent Labs  Lab 06/18/21 2237  AST 23  ALT 18  ALKPHOS 84  BILITOT 0.9  PROT 6.5  ALBUMIN 2.9*    No results for input(s): LIPASE, AMYLASE in the last 168 hours. No results for input(s): AMMONIA in the last 168 hours. Coagulation Profile: No results for input(s): INR, PROTIME in the last 168 hours. Cardiac Enzymes: No results for input(s): CKTOTAL, CKMB, CKMBINDEX, TROPONINI in the last 168 hours. BNP (last 3 results) No results for input(s): PROBNP in the last 8760 hours. HbA1C: No results for input(s): HGBA1C in the last 72 hours. CBG: Recent Labs  Lab 06/19/21 1615  GLUCAP 87    Lipid  Profile: No results for input(s): CHOL, HDL, LDLCALC, TRIG, CHOLHDL, LDLDIRECT in the last 72 hours. Thyroid Function Tests: No results for input(s): TSH, T4TOTAL, FREET4, T3FREE, THYROIDAB in the last 72 hours. Anemia Panel: No results for input(s): VITAMINB12, FOLATE, FERRITIN, TIBC, IRON, RETICCTPCT in the last 72 hours. Sepsis Labs: Recent Labs  Lab 06/18/21 0012 06/19/21 0247  LATICACIDVEN 2.5* 1.4     Recent Results (from the past 240 hour(s))  Urine culture     Status: Abnormal   Collection Time: 06/17/21  2:29 AM   Specimen: Urine, Random  Result Value Ref Range Status   Specimen Description URINE, RANDOM  Final   Special Requests   Final    Immunocompromised Performed at [facility], [place] [address], [place], [place] [address]    Culture MULTIPLE SPECIES PRESENT, SUGGEST RECOLLECTION (A)  Final   Report Status 06/20/2021 FINAL  Final  Resp Panel by RT-PCR (Flu A&B, Covid) Nasopharyngeal Swab     Status: None   Collection Time: 06/18/21 11:18 PM   Specimen: Nasopharyngeal Swab; Nasopharyngeal(NP) swabs in vial transport medium  Result Value Ref Range Status   SARS Coronavirus 2 by RT PCR NEGATIVE NEGATIVE Final    Comment: (NOTE) SARS-CoV-2 target nucleic acids are NOT DETECTED.  The SARS-CoV-2 RNA is generally detectable in upper respiratory specimens during the acute phase of infection. The lowest concentration of SARS-CoV-2 viral copies this assay can detect is 138 copies/mL. A negative result does not preclude SARS-Cov-2 infection and should not be used as the sole basis for treatment or other patient management decisions. A negative result may occur with  improper specimen collection/handling, submission of specimen other than nasopharyngeal swab, presence of viral mutation(s) within the areas targeted by this assay, and inadequate number of viral copies(<138 copies/mL). A negative result must be combined with clinical observations, patient history, and  epidemiological information. The expected result is Negative.  Fact Sheet for Patients:  EntrepreneurPulse.com.au  Fact Sheet for Healthcare Providers:  IncredibleEmployment.be  This test is no t yet approved or cleared by the [place] FDA and  has been authorized for detection and/or diagnosis of SARS-CoV-2 by FDA under an Emergency Use Authorization (EUA). This EUA will remain  in effect (meaning this test can be used) for the duration of the COVID-19 declaration under Section 564(b)(1) of the Act, 21 U.S.C.section 360bbb-3(b)(1), unless the authorization is terminated  or revoked sooner.       Influenza A by PCR NEGATIVE NEGATIVE Final   Influenza B by PCR NEGATIVE NEGATIVE Final    Comment: (NOTE) The Xpert Xpress SARS-CoV-2/FLU/RSV plus assay is intended as an  aid in the diagnosis of influenza from Nasopharyngeal swab specimens and should not be used as a sole basis for treatment. Nasal washings and aspirates are unacceptable for Xpert Xpress SARS-CoV-2/FLU/RSV testing.  Fact Sheet for Patients: EntrepreneurPulse.com.au  Fact Sheet for Healthcare Providers: IncredibleEmployment.be  This test is not yet approved or cleared by the [place] FDA and has been authorized for detection and/or diagnosis of SARS-CoV-2 by FDA under an Emergency Use Authorization (EUA). This EUA will remain in effect (meaning this test can be used) for the duration of the COVID-19 declaration under Section 564(b)(1) of the Act, 21 U.S.C. section 360bbb-3(b)(1), unless the authorization is terminated or revoked.  Performed at [facility], [place] [address] [place], [place] [address]   Blood culture (routine x 2)     Status: None (Preliminary result)   Collection Time: 06/19/21 12:01 AM   Specimen: BLOOD  Result Value Ref Range Status   Specimen Description BLOOD RIGHT ANTECUBITAL  Final   Special Requests    Final    BOTTLES DRAWN AEROBIC AND ANAEROBIC Blood Culture adequate volume   Culture   Final    NO GROWTH 4 DAYS Performed at [facility], [place] [address], [place], [place] [address]    Report Status PENDING  Incomplete  MRSA Next Gen by PCR, Nasal     Status: None   Collection Time: 06/19/21  5:37 AM   Specimen: Nasal Mucosa; Nasal Swab  Result Value Ref Range Status   MRSA by PCR Next Gen NOT DETECTED NOT DETECTED Final    Comment: (NOTE) The GeneXpert MRSA Assay (FDA approved for NASAL specimens only), is one component of a comprehensive MRSA colonization surveillance program. It is not intended to diagnose MRSA infection nor to guide or monitor treatment for MRSA infections. Test performance is not FDA approved in patients less than 29 years old. Performed at [facility], [place] [address], [place], [place] [address]   Blood culture (routine x 2)     Status: None (Preliminary result)   Collection Time: 06/19/21  6:57 AM   Specimen: BLOOD  Result Value Ref Range Status   Specimen Description BLOOD SITE NOT SPECIFIED  Final   Special Requests   Final    BOTTLES DRAWN AEROBIC AND ANAEROBIC Blood Culture results may not be optimal due to an inadequate volume of blood received in culture bottles   Culture   Final    NO GROWTH 4 DAYS Performed at [facility], [place] [address], [place], [place] [address]    Report Status PENDING  Incomplete  Culture, Urine     Status: None   Collection Time: 06/19/21  9:24 PM   Specimen: Urine, Random  Result Value Ref Range Status   Specimen Description URINE, RANDOM  Final   Special Requests NONE  Final   Culture   Final    NO GROWTH Performed at [facility], [place] [address], [place],  [address]    Report Status 06/21/2021 FINAL  Final      Radiology Studies: No results found.  Scheduled Meds:  atorvastatin  20 mg Oral Once per day on Sun Tue Thu Sat   levothyroxine  75 mcg Oral Q0600   LORazepam  0.5 mg  Oral Once   polyethylene glycol  17 g Oral BID   rivaroxaban  20 mg Oral Q supper   Continuous Infusions:  promethazine (PHENERGAN) injection (IM or IVPB) 6.25 mg (06/23/21 1408)  LOS: 5 days   Time spent: 33mn  Derrek Puff C Kadi Hession, DO [facility]  If 7PM-7AM, please contact night-coverage [email or web address]  06/24/2021, 7:55 AM

## 2021-06-24 NOTE — Progress Notes (Signed)
[facility] - [facility]) hospitalized hospice patient.   This patient is a current [facility] hospice patient with a terminal diagnosis of anal cancer. Hospice nurse attempted to visit today and patient was not home. Contacted family member who reported patient was transported to ED for evaluation. Per ED notes patient had been more drowsy and confused. EMS was activated and patient was transported to [facility] ED. Patient was admitted to [facility] on 6.29.2022 with a diagnosis of pneumonia and sepsis. Per Dr. Tomasa Hosteller with [facility] this is a related hospital admission.  Visited at bedside, not visitors present. Patient is alert and oriented. No reported nausea today.  Her SpO2 was in the 80's, on room air, O2 @2Lnc  at 3L applied with increasing to SpO2 98%.  She wanted to order food, NA assisted with this. Spoke with her niece, Andi by phone.   This patient is appropriate for appropriate for inpatient services due to ongoing IV therapies for antiemetics and PRN meds to assess for comfort.   VS: 98.2 oral, 167/83, 88 HR, 20 RR,  98% RA   I/O: not recorded today.    Abnormal Labs:  K 3.0,  Cl 96, CO2 33, Ca 8.8,  platelets 144k   Diagnostics: No new imaging  Abnormal labs 06/24/2021 04:13 Potassium: 3.3 (L) Chloride: 97 (L) CO2: 35 (H) Platelets: 120 (L)  Diagnostics: No new imaging   IV/PRN Medications: Zofran 4mg  PRN @ 0158, 1324  Problem list:  Principal Problem:   Pneumonia Active Problems:   UTI (urinary tract infection)   Severe sepsis (HCC)   Acute hypoxemic respiratory failure (HCC)   Elevated troponin  Recommendations: Management of pneumonia/respiratory failure per the medical service Continue oxycodone/APAP as needed for pain Plan for discharge to home with hospice care when medically appropriate  Discharge Planning: Return home with hospice services when stable for discharge.      Family Contact: Spoke with niece, Bernadette Hoit, and updated her. No new concerns at this time,  shares plan to visit pt this evening.   IDT: Updated   Goals of Care: Clear. Treat for current infection and maintain comfort. DNR   Medication lists and transfer summary placed on shadow chart.    Should patient need ambulance transport at discharge please use [facility].   A Please do not hesitate to call with questions.   Thank you,   Farrel Gordon, RN, [facility] Liaison   [phone]

## 2021-06-25 ENCOUNTER — Encounter: Payer: Self-pay | Admitting: Oncology

## 2021-06-25 ENCOUNTER — Ambulatory Visit: Payer: Medicare Other | Admitting: Nurse Practitioner

## 2021-06-25 NOTE — Discharge Summary (Addendum)
Physician Discharge Summary  Joanne Klein:[id number] DOB: August 13, 1946 DOA: 06/18/2021  PCP: Hoyt Koch, MD  Admit date: 06/18/2021 Discharge date: 06/25/2021  Admitted From: Home Disposition:  Home with hospice  Recommendations for Outpatient Follow-up:  Follow up with PCP in 1-2 weeks Please obtain BMP/CBC in one week Please follow up with hospice  Home Health:Home hospice  Equipment/Devices:None  Discharge Condition: Guarded  CODE STATUS:DNR  Diet recommendation: As tolerated    Brief/Interim Summary: Joanne Klein is a 75 y.o. 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  Patient's niece reported that a neighbor came to check on the patient around 3 PM and noted that she was more drowsy but when the patient's daughter came to check on her around 8:30 PM she was found confused.  She is typically alert and oriented x3 at baseline.  Family felt that her extremities were cool to touch.  Upon EMS arrival, oxygen saturation was 79% on room air and she was placed on 10 L oxygen via nonrebreather.  Family also reported a productive cough.  Patient just recently underwent biopsy due to concern for recurrence of anal cancer which shows squamous cell characteristics - family reported that they met with hospice and signed a DNR.  Patient admitted as above with acute respiratory distress with hypoxia due to severe sepsis and pneumonia/UTI. Minimally elevated troponin incidental, AKI resolved. Respiratory status  improved over the past 48h and patient completed antibiotics while she was here - pain medications were adjusted while in house due to somnolence and lethargy - methadone discontinued. Recommend close follow up with hospice for further pain management. Discharge home with hospice per patient and daughter's request. Otherwise close follow up with surgery/oncology as scheduled.   Discharge Diagnoses:  Principal Problem:   Pneumonia Active Problems:   UTI (urinary tract infection)   Severe sepsis (HCC)   Acute hypoxemic respiratory failure (HCC)   Elevated troponin  Discharge Instructions  Discharge Instructions     Call MD for:  difficulty breathing, headache or visual disturbances   Complete by: As directed    Call MD for:  extreme fatigue   Complete by: As directed    Call MD for:  persistant nausea and vomiting   Complete by: As directed    Call MD for:  redness, tenderness, or signs of infection (pain, swelling, redness, odor or green/yellow discharge around incision site)   Complete by: As directed    Call MD for:  severe uncontrolled pain   Complete by: As directed    Call MD for:  temperature >100.4   Complete by: As directed    Change dressing (specify)   Complete by: As directed    Wound care to the full thickness perianal lesion:  Cleanse with soap and water, rinse gently and pat dry. Apply a size-appropriate piece of folded xeroform gauze, top with a single gauze 4x4 folded in half.  No tape, perineal folds will assist in securement.  Replace dressing twice daily and PRN dressing dislodgement.   Diet - low sodium heart healthy   Complete by: As directed  Increase activity slowly   Complete by: As directed       Allergies as of 06/25/2021       Reactions   Prozac [fluoxetine Hcl] Anaphylaxis   Codeine Itching   Morphine And Related Itching   Sulfa Antibiotics Itching, Nausea Only        Medication List     STOP taking these medications    Medical  Compression Stockings Misc   methadone 5 MG tablet Commonly known as: DOLOPHINE   pregabalin 75 MG capsule Commonly known as: LYRICA       TAKE these medications    atorvastatin 20 MG tablet Commonly known as: LIPITOR Take 1 tablet (20 mg total) by mouth 4 (four) times a week. Take 1 tablet (20 mg) by mouth in the evening Saturdays, Sundays, Tuesdays & Thursdays.   calcium carbonate 500 MG chewable tablet Commonly known as: TUMS - dosed in mg elemental calcium Chew 1 tablet by mouth daily as needed for indigestion or heartburn.   levothyroxine 75 MCG tablet Commonly known as: SYNTHROID TAKE 1 TABLET BY MOUTH  DAILY BEFORE BREAKFAST   MIRALAX PO Take 17 g by mouth daily as needed (constipation).   omeprazole 40 MG capsule Commonly known as: PRILOSEC TAKE 1 CAPSULE BY MOUTH  DAILY What changed: when to take this   ondansetron 4 MG tablet Commonly known as: ZOFRAN TAKE 1 TABLET(4 MG) BY MOUTH EVERY 8 HOURS AS NEEDED FOR NAUSEA OR VOMITING What changed: See the new instructions.   oxyCODONE-acetaminophen 10-325 MG tablet Commonly known as: PERCOCET Take 1 tablet by mouth every 4 (four) hours as needed for pain.   phenylephrine-shark liver oil-mineral oil-petrolatum 0.25-3-14-71.9 % rectal ointment Commonly known as: PREPARATION H Place 1 application rectally 2 (two) times daily as needed for hemorrhoids.   sertraline 100 MG tablet Commonly known as: ZOLOFT TAKE 2 TABLETS BY MOUTH AT  BEDTIME   vitamin B-12 1000 MCG tablet Commonly known as: CYANOCOBALAMIN Take 1,000 mcg by mouth at bedtime.   Vitamin D-3 25 MCG (1000 UT) Caps Take 1,000 Units by mouth at bedtime.       ASK your doctor about these medications    rivaroxaban 20 MG Tabs tablet Commonly known as: XARELTO Take 1 tablet (20 mg total) by mouth daily with supper.               Durable Medical Equipment  (From admission, onward)           Start     Ordered   06/24/21 0801  DME  Oxygen  Once       Question Answer Comment  Length of Need Lifetime   Mode or (Route) Nasal cannula   Liters per Minute 4   Frequency Continuous (stationary and portable oxygen unit needed)   Oxygen delivery system Gas      07 /04/22 0802              Discharge Care Instructions  (From admission, onward)           Start     Ordered   06/24/21 0000  Change dressing (specify)       Comments: Wound care to the full thickness perianal lesion:  Cleanse with soap and water, rinse gently and pat dry. Apply a size-appropriate piece of folded xeroform gauze, top with a single gauze 4x4 folded in half.  No tape, perineal folds will assist in securement.  Replace dressing twice daily and PRN dressing dislodgement.   06/24/21  0802            Allergies  Allergen Reactions   Prozac [Fluoxetine Hcl] Anaphylaxis   Codeine Itching   Morphine And Related Itching   Sulfa Antibiotics Itching and Nausea Only    Consultations: Hospice, Palliative/Hospice  Procedures/Studies: DG Chest Port 1 View  Result Date: 06/18/2021 CLINICAL DATA:  Dyspnea, altered mental status EXAM: PORTABLE CHEST 1 VIEW COMPARISON:  09/27/2020 FINDINGS: Lung volumes are small. Left basilar focal pulmonary infiltrate has developed, possibly infectious in the appropriate clinical setting. No pneumothorax. Tiny left pleural effusion. Cardiac size within normal limits. No acute bone abnormality. IMPRESSION: Left basilar focal pulmonary infiltrate, possibly infectious, with associated tiny left parapneumonic effusion. Electronically Signed   By: Fidela Salisbury MD   On: 06/18/2021 22:59     Subjective: No acute issues/events overnight   Discharge Exam: Vitals:   06/25/21 0100 06/25/21 0430  BP: (!) 147/79 140/72  Pulse: 89 81  Resp: 18 12  Temp: 98 F (36.7 C) 98.3 F (36.8 C)  SpO2: 95% 94%   Vitals:   06/24/21 1522 06/24/21 2000 06/25/21 0100 06/25/21 0430  BP: (!) 159/78 (!) 161/80 (!) 147/79 140/72   Pulse: 84 88 89 81  Resp: 16 16 18 12   Temp: 98.3 F (36.8 C) 98.2 F (36.8 C) 98 F (36.7 C) 98.3 F (36.8 C)  TempSrc: Oral Oral Oral   SpO2: 97% 91% 95% 94%  Weight:      Height:       General exam: Awake, alert, oriented, in no acute distress Respiratory system: Bilateral rhonchi. Cardiovascular system: S1 & S2 heard, RRR. No JVD, murmurs, rubs, gallops or clicks. No pedal edema. Gastrointestinal system: Abdomen is nondistended, soft and nontender. No organomegaly or masses felt. Normal bowel sounds heard. Central nervous system: Alert and oriented.  Chronic bilateral lower extremity weakness. Extremities: Symmetric 5 x 5 power bilateral upper extremities, 1 out of 5 lower extremity strength equal bilaterally chronic. Skin: No rashes, lesions or ulcers Psychiatry: Judgement and insight appear normal. Mood & affect appropriate    The results of significant diagnostics from this hospitalization (including imaging, microbiology, ancillary and laboratory) are listed below for reference.     Microbiology: Recent Results (from the past 240 hour(s))  Urine culture     Status: Abnormal   Collection Time: 06/17/21  2:29 AM   Specimen: Urine, Random  Result Value Ref Range Status   Specimen Description URINE, RANDOM  Final   Special Requests   Final    Immunocompromised Performed at [facility], [address] [place], [place] [address]    Culture MULTIPLE SPECIES PRESENT, SUGGEST RECOLLECTION (A)  Final   Report Status 06/20/2021 FINAL  Final  Resp Panel by RT-PCR (Flu A&B, Covid) Nasopharyngeal Swab     Status: None   Collection Time: 06/18/21 11:18 PM   Specimen: Nasopharyngeal Swab; Nasopharyngeal(NP) swabs in vial transport medium  Result Value Ref Range Status   SARS Coronavirus 2 by RT PCR NEGATIVE NEGATIVE Final    Comment: (NOTE) SARS-CoV-2 target nucleic acids are NOT DETECTED.  The SARS-CoV-2 RNA is generally detectable in upper respiratory specimens  during the acute phase of infection. The lowest concentration of SARS-CoV-2 viral copies this assay can detect is 138 copies/mL. A negative result does not preclude SARS-Cov-2 infection and should not be used as the sole basis for treatment or other patient management decisions. A negative result may occur with  improper specimen collection/handling, submission of specimen  other than nasopharyngeal swab, presence of viral mutation(s) within the areas targeted by this assay, and inadequate number of viral copies(<138 copies/mL). A negative result must be combined with clinical observations, patient history, and epidemiological information. The expected result is Negative.  Fact Sheet for Patients:  EntrepreneurPulse.com.au  Fact Sheet for Healthcare Providers:  IncredibleEmployment.be  This test is no t yet approved or cleared by the [place] FDA and  has been authorized for detection and/or diagnosis of SARS-CoV-2 by FDA under an Emergency Use Authorization (EUA). This EUA will remain  in effect (meaning this test can be used) for the duration of the COVID-19 declaration under Section 564(b)(1) of the Act, 21 U.S.C.section 360bbb-3(b)(1), unless the authorization is terminated  or revoked sooner.       Influenza A by PCR NEGATIVE NEGATIVE Final   Influenza B by PCR NEGATIVE NEGATIVE Final    Comment: (NOTE) The Xpert Xpress SARS-CoV-2/FLU/RSV plus assay is intended as an aid in the diagnosis of influenza from Nasopharyngeal swab specimens and should not be used as a sole basis for treatment. Nasal washings and aspirates are unacceptable for Xpert Xpress SARS-CoV-2/FLU/RSV testing.  Fact Sheet for Patients: EntrepreneurPulse.com.au  Fact Sheet for Healthcare Providers: IncredibleEmployment.be  This test is not yet approved or cleared by the [place] FDA and has been authorized for detection  and/or diagnosis of SARS-CoV-2 by FDA under an Emergency Use Authorization (EUA). This EUA will remain in effect (meaning this test can be used) for the duration of the COVID-19 declaration under Section 564(b)(1) of the Act, 21 U.S.C. section 360bbb-3(b)(1), unless the authorization is terminated or revoked.  Performed at [facility], [place] [address], [place], [place] [address]   Blood culture (routine x 2)     Status: None   Collection Time: 06/19/21 12:01 AM   Specimen: BLOOD  Result Value Ref Range Status   Specimen Description BLOOD RIGHT ANTECUBITAL  Final   Special Requests   Final    BOTTLES DRAWN AEROBIC AND ANAEROBIC Blood Culture adequate volume   Culture   Final    NO GROWTH 5 DAYS Performed at [facility], [place] [address], [place], [place] [address]    Report Status 06/24/2021 FINAL  Final  MRSA Next Gen by PCR, Nasal     Status: None   Collection Time: 06/19/21  5:37 AM   Specimen: Nasal Mucosa; Nasal Swab  Result Value Ref Range Status   MRSA by PCR Next Gen NOT DETECTED NOT DETECTED Final    Comment: (NOTE) The GeneXpert MRSA Assay (FDA approved for NASAL specimens only), is one component of a comprehensive MRSA colonization surveillance program. It is not intended to diagnose MRSA infection nor to guide or monitor treatment for MRSA infections. Test performance is not FDA approved in patients less than 31 years old. Performed at [facility], [place] [address] [place], [place] [address]   Blood culture (routine x 2)     Status: None   Collection Time: 06/19/21  6:57 AM   Specimen: BLOOD  Result Value Ref Range Status   Specimen Description BLOOD SITE NOT SPECIFIED  Final   Special Requests   Final    BOTTLES DRAWN AEROBIC AND ANAEROBIC Blood Culture results may not be optimal due to an inadequate volume of blood received in culture bottles   Culture   Final    NO GROWTH 5 DAYS Performed at [facility], [place] [address],  [place], [place]  [address]    Report Status 06/24/2021 FINAL  Final  Culture, Urine     Status: None   Collection Time: 06/19/21  9:24 PM   Specimen: Urine, Random  Result Value Ref Range Status   Specimen Description URINE, RANDOM  Final   Special Requests NONE  Final   Culture   Final    NO GROWTH Performed at [facility], [place] [address] [place], [place] [address]    Report Status 06/21/2021 FINAL  Final     Labs: BNP (last 3 results) Recent Labs    06/18/21 2237  BNP 349.1*   Basic Metabolic Panel: Recent Labs  Lab 06/20/21 0349 06/21/21 0222 06/22/21 0223 06/23/21 0451 06/24/21 0413  NA 140 140 139 137 138  K 4.0 3.2* 3.1* 3.0* 3.3*  CL 104 106 102 96* 97*  CO2 29 28 29  33* 35*  GLUCOSE 81 113* 84 89 84  BUN 23 19 10 9 13   CREATININE 0.98 0.64 0.52 0.65 0.75  CALCIUM 9.0 8.6* 8.7* 8.8* 8.9   Liver Function Tests: Recent Labs  Lab 06/18/21 2237  AST 23  ALT 18  ALKPHOS 84  BILITOT 0.9  PROT 6.5  ALBUMIN 2.9*   No results for input(s): LIPASE, AMYLASE in the last 168 hours. No results for input(s): AMMONIA in the last 168 hours. CBC: Recent Labs  Lab 06/18/21 2237 06/18/21 2341 06/20/21 0349 06/21/21 0222 06/22/21 0223 06/23/21 0451 06/24/21 0413  WBC 12.7*  --  6.3 5.1 5.2 6.0 6.6  NEUTROABS 10.6*  --   --   --   --   --   --   HGB 14.0   < > 10.7* 10.1* 11.3* 12.3 12.8  HCT 42.6   < > 32.5* 30.6* 33.6* 36.3 37.5  MCV 95.5  --  96.7 94.7 92.3 91.2 92.4  PLT 149*  --  PLATELET CLUMPS NOTED ON SMEAR, COUNT APPEARS ADEQUATE PLATELETS APPEAR INCREASED 128* 144* 120*   < > = values in this interval not displayed.   Cardiac Enzymes: No results for input(s): CKTOTAL, CKMB, CKMBINDEX, TROPONINI in the last 168 hours. BNP: Invalid input(s): POCBNP CBG: Recent Labs  Lab 06/19/21 1615  GLUCAP 87   D-Dimer No results for input(s): DDIMER in the last 72 hours. Hgb A1c No results for input(s): HGBA1C in the last 72 hours. Lipid Profile No  results for input(s): CHOL, HDL, LDLCALC, TRIG, CHOLHDL, LDLDIRECT in the last 72 hours. Thyroid function studies No results for input(s): TSH, T4TOTAL, T3FREE, THYROIDAB in the last 72 hours.  Invalid input(s): FREET3 Anemia work up No results for input(s): VITAMINB12, FOLATE, FERRITIN, TIBC, IRON, RETICCTPCT in the last 72 hours. Urinalysis    Component Value Date/Time   COLORURINE AMBER (A) 06/19/2021 0229   APPEARANCEUR CLOUDY (A) 06/19/2021 0229   LABSPEC 1.027 06/19/2021 0229   PHURINE 5.0 06/19/2021 0229   GLUCOSEU NEGATIVE 06/19/2021 0229   HGBUR NEGATIVE 06/19/2021 0229   BILIRUBINUR NEGATIVE 06/19/2021 0229   KETONESUR NEGATIVE 06/19/2021 0229   PROTEINUR 100 (A) 06/19/2021 0229   UROBILINOGEN 1.0 04/20/2008 0800   NITRITE NEGATIVE 06/19/2021 0229   LEUKOCYTESUR LARGE (A) 06/19/2021 0229   Sepsis Labs Invalid input(s): PROCALCITONIN,  WBC,  LACTICIDVEN Microbiology Recent Results (from the past 240 hour(s))  Urine culture     Status: Abnormal   Collection Time: 06/17/21  2:29 AM   Specimen: Urine, Random  Result Value Ref Range Status   Specimen Description URINE, RANDOM  Final  Special Requests   Final    Immunocompromised Performed at [facility], [place] [address] [place], [place] [address]    Culture MULTIPLE SPECIES PRESENT, SUGGEST RECOLLECTION (A)  Final   Report Status 06/20/2021 FINAL  Final  Resp Panel by RT-PCR (Flu A&B, Covid) Nasopharyngeal Swab     Status: None   Collection Time: 06/18/21 11:18 PM   Specimen: Nasopharyngeal Swab; Nasopharyngeal(NP) swabs in vial transport medium  Result Value Ref Range Status   SARS Coronavirus 2 by RT PCR NEGATIVE NEGATIVE Final    Comment: (NOTE) SARS-CoV-2 target nucleic acids are NOT DETECTED.  The SARS-CoV-2 RNA is generally detectable in upper respiratory specimens during the acute phase of infection. The lowest concentration of SARS-CoV-2 viral copies this assay can detect is 138 copies/mL. A  negative result does not preclude SARS-Cov-2 infection and should not be used as the sole basis for treatment or other patient management decisions. A negative result may occur with  improper specimen collection/handling, submission of specimen other than nasopharyngeal swab, presence of viral mutation(s) within the areas targeted by this assay, and inadequate number of viral copies(<138 copies/mL). A negative result must be combined with clinical observations, patient history, and epidemiological information. The expected result is Negative.  Fact Sheet for Patients:  EntrepreneurPulse.com.au  Fact Sheet for Healthcare Providers:  IncredibleEmployment.be  This test is no t yet approved or cleared by the [place] FDA and  has been authorized for detection and/or diagnosis of SARS-CoV-2 by FDA under an Emergency Use Authorization (EUA). This EUA will remain  in effect (meaning this test can be used) for the duration of the COVID-19 declaration under Section 564(b)(1) of the Act, 21 U.S.C.section 360bbb-3(b)(1), unless the authorization is terminated  or revoked sooner.       Influenza A by PCR NEGATIVE NEGATIVE Final   Influenza B by PCR NEGATIVE NEGATIVE Final    Comment: (NOTE) The Xpert Xpress SARS-CoV-2/FLU/RSV plus assay is intended as an aid in the diagnosis of influenza from Nasopharyngeal swab specimens and should not be used as a sole basis for treatment. Nasal washings and aspirates are unacceptable for Xpert Xpress SARS-CoV-2/FLU/RSV testing.  Fact Sheet for Patients: EntrepreneurPulse.com.au  Fact Sheet for Healthcare Providers: IncredibleEmployment.be  This test is not yet approved or cleared by the [place] FDA and has been authorized for detection and/or diagnosis of SARS-CoV-2 by FDA under an Emergency Use Authorization (EUA). This EUA will remain in effect (meaning this test can  be used) for the duration of the COVID-19 declaration under Section 564(b)(1) of the Act, 21 U.S.C. section 360bbb-3(b)(1), unless the authorization is terminated or revoked.  Performed at [facility], [place] [address], [place], [place] [address]   Blood culture (routine x 2)     Status: None   Collection Time: 06/19/21 12:01 AM   Specimen: BLOOD  Result Value Ref Range Status   Specimen Description BLOOD RIGHT ANTECUBITAL  Final   Special Requests   Final    BOTTLES DRAWN AEROBIC AND ANAEROBIC Blood Culture adequate volume   Culture   Final    NO GROWTH 5 DAYS Performed at [facility], [place] [address] [place], [place] [address]    Report Status 06/24/2021 FINAL  Final  MRSA Next Gen by PCR, Nasal     Status: None   Collection Time: 06/19/21  5:37 AM   Specimen: Nasal Mucosa; Nasal Swab  Result Value Ref Range Status   MRSA by PCR Next  Gen NOT DETECTED NOT DETECTED Final    Comment: (NOTE) The GeneXpert MRSA Assay (FDA approved for NASAL specimens only), is one component of a comprehensive MRSA colonization surveillance program. It is not intended to diagnose MRSA infection nor to guide or monitor treatment for MRSA infections. Test performance is not FDA approved in patients less than 60 years old. Performed at [facility], [place] [address], [place], [place] [address]   Blood culture (routine x 2)     Status: None   Collection Time: 06/19/21  6:57 AM   Specimen: BLOOD  Result Value Ref Range Status   Specimen Description BLOOD SITE NOT SPECIFIED  Final   Special Requests   Final    BOTTLES DRAWN AEROBIC AND ANAEROBIC Blood Culture results may not be optimal due to an inadequate volume of blood received in culture bottles   Culture   Final    NO GROWTH 5 DAYS Performed at [facility], [place] [address], [place], [place] [address]    Report Status 06/24/2021 FINAL  Final  Culture, Urine     Status: None   Collection Time: 06/19/21  9:24 PM    Specimen: Urine, Random  Result Value Ref Range Status   Specimen Description URINE, RANDOM  Final   Special Requests NONE  Final   Culture   Final    NO GROWTH Performed at [facility], [place] [address], [place], [place] [address]    Report Status 06/21/2021 FINAL  Final     Time coordinating discharge: Over 30 minutes  SIGNED:   Little Ishikawa, DO [facility] 06/25/2021, 7:38 AM Pager   If 7PM-7AM, please contact night-coverage [email or web address]

## 2021-06-25 NOTE — TOC Transition Note (Signed)
Transition of Care [facility]) - CM/SW Discharge Note   Patient Details  Name: Joanne Klein MRN: [id number] Date of Birth: Oct 04, 1946  Transition of Care [facility]) CM/SW Contact:  Angelita Ingles, RN Phone Number:[phone]  06/25/2021, 12:57 PM   Clinical Narrative:    Patient is discharging home with hospice. CM at bedside and patient confirms that she does have someone that will meet her at home and that she does have the appropiate DM at home Transport has been set up with [facility]. Address has been verified with the patient. [place] has been made aware of discharge. No other needs noted at this time. TOC will sign off. D/c packet is at the nurses station.    Final next level of care: Home w Hospice Care Barriers to Discharge: No Barriers Identified   Patient Goals and CMS Choice Patient states their goals for this hospitalization and ongoing recovery are:: patient wants to return home      Discharge Placement                       Discharge Plan and Services   Discharge Planning Services: CM Consult Post Acute Care Choice: Hospice          DME Arranged: N/A DME Agency: NA         HH Agency: Other - See comment Water quality scientist)        Social Determinants of Health (SDOH) Interventions     Readmission Risk Interventions Readmission Risk Prevention Plan 06/22/2020  Transportation Screening Complete  PCP or Specialist Appt within 5-7 Days Not Complete  Not Complete comments disposition pending  Home Care Screening Complete  Medication Review (RN CM) Referral to Pharmacy  Some recent data might be hidden

## 2021-06-27 ENCOUNTER — Inpatient Hospital Stay: Payer: Medicare Other | Admitting: Nurse Practitioner

## 2021-06-28 ENCOUNTER — Telehealth: Payer: Self-pay | Admitting: Pharmacist

## 2021-06-28 NOTE — Progress Notes (Signed)
Chronic Care Management Pharmacy Assistant   Name: Joanne Klein  MRN: [id number] DOB: 10/11/1946  Reason for Encounter: Disease State - General Adherence   Recent office visits:  None noted  Recent consult visits:  None noted  Hospital visits:  Medication Reconciliation was completed by comparing discharge summary, patient's EMR and Pharmacy list, and upon discussion with patient.  Admitted to the hospital on 06/18/21 due to Sepsis. Discharge date was 06/25/21. Discharged from [facility].    Medication Changes at Hospital Discharge: -Changed how you take: Levothyroxine (SYNTHROID) Omeprazole (PRILOSEC) Ondansetron (ZOFRAN) Sertraline (ZOLOFT)  Medications Discontinued at Hospital Discharge: -Stopped Medical Compression Stockings Misc Methadone 5 MG tablet (DOLOPHINE) Pregabalin 75 MG capsule (LYRICA)  Medications that remain the same after Hospital Discharge:??  -Ask how to take Xarelto -All other medications will remain the same.    Medications: Outpatient Encounter Medications as of 06/28/2021  Medication Sig   atorvastatin (LIPITOR) 20 MG tablet Take 1 tablet (20 mg total) by mouth 4 (four) times a week. Take 1 tablet (20 mg) by mouth in the evening Saturdays, Sundays, Tuesdays & Thursdays.   calcium carbonate (TUMS - DOSED IN MG ELEMENTAL CALCIUM) 500 MG chewable tablet Chew 1 tablet by mouth daily as needed for indigestion or heartburn.    Cholecalciferol (VITAMIN D-3) 1000 UNITS CAPS Take 1,000 Units by mouth at bedtime.   levothyroxine (SYNTHROID) 75 MCG tablet TAKE 1 TABLET BY MOUTH  DAILY BEFORE BREAKFAST   omeprazole (PRILOSEC) 40 MG capsule TAKE 1 CAPSULE BY MOUTH  DAILY   ondansetron (ZOFRAN) 4 MG tablet TAKE 1 TABLET(4 MG) BY MOUTH EVERY 8 HOURS AS NEEDED FOR NAUSEA OR VOMITING   oxyCODONE-acetaminophen (PERCOCET) 10-325 MG tablet Take 1 tablet by mouth every 4 (four) hours as needed for pain.   phenylephrine-shark liver oil-mineral oil-petrolatum  (PREPARATION H) 0.25-3-14-71.9 % rectal ointment Place 1 application rectally 2 (two) times daily as needed for hemorrhoids.   Polyethylene Glycol 3350 (MIRALAX PO) Take 17 g by mouth daily as needed (constipation).   rivaroxaban (XARELTO) 20 MG TABS tablet Take 1 tablet (20 mg total) by mouth daily with supper. (Patient taking differently: Take 20 mg by mouth at bedtime.)   sertraline (ZOLOFT) 100 MG tablet TAKE 2 TABLETS BY MOUTH AT  BEDTIME   vitamin B-12 (CYANOCOBALAMIN) 1000 MCG tablet Take 1,000 mcg by mouth at bedtime.   No facility-administered encounter medications on file as of 06/28/2021.    Have you had any problems recently with your health? Patient states she had a panic attack and upset stomach this past weekend and was given two new prescriptions.  Have you had any problems with your pharmacy? Patient states no recent problems with pharmacy, she did stop her mail order auto re-fill because she received a duplicate order and have plenty on hand right now. She states she will start it back when its time to refill again.   What issues or side effects are you having with your medications? Patient states no issues or side effects.   What would you like me to pass along to [facility], CPP for them to help you with?  Patient states she would like to reschedule her missed appointment, please schedule appointment with her niece Joanne Klein [phone] who is her caretaker.   What can we do to take care of you better? Patient states nothing at this time.   Star Rating Drugs: Atorvastatin - 01/28/21 90D - Patient use mail order and has extras on  hand.  Orinda Kenner, [place] Clinical Pharmacists Assistant [phone]  Time Spent: [phone]

## 2021-07-02 DIAGNOSIS — S142XXA Injury of nerve root of cervical spine, initial encounter: Secondary | ICD-10-CM | POA: Diagnosis not present

## 2021-07-02 DIAGNOSIS — G8220 Paraplegia, unspecified: Secondary | ICD-10-CM | POA: Diagnosis not present

## 2021-07-02 DIAGNOSIS — M5416 Radiculopathy, lumbar region: Secondary | ICD-10-CM | POA: Diagnosis not present

## 2021-07-02 DIAGNOSIS — I890 Lymphedema, not elsewhere classified: Secondary | ICD-10-CM | POA: Diagnosis not present

## 2021-07-11 ENCOUNTER — Other Ambulatory Visit: Payer: Self-pay | Admitting: Internal Medicine

## 2021-07-22 ENCOUNTER — Encounter: Payer: Self-pay | Admitting: Oncology

## 2021-07-23 ENCOUNTER — Telehealth

## 2021-07-23 NOTE — Progress Notes (Deleted)
Chronic Care Management Pharmacy Note  07/23/2021 Name:  ALISSE TUITE MRN:  [id number] DOB:  [birth year]  Summary: ***  Recommendations/Changes made from today's visit: ***  Plan: ***   Subjective: CLYDINE PARKISON is an 75 y.o. year old female who is a primary patient of Hoyt Koch, MD.  The CCM team was consulted for assistance with disease management and care coordination needs.    {CCMTELEPHONEFACETOFACE:21091510} for {CCMINITIALFOLLOWUPCHOICE:21091511} in response to provider referral for pharmacy case management and/or care coordination services.   Consent to Services:  {CCMCONSENTOPTIONS:25074}  Patient Care Team: Hoyt Koch, MD as PCP - General (Internal Medicine) Inda Castle, MD (Inactive) (Gastroenterology) Nicholaus Bloom, MD (Anesthesiology) Charlton Haws, [facility] as Pharmacist (Pharmacist) Ladell Pier, MD as Consulting Physician (Oncology)  Recent office visits: ***  Recent consult visits: [facility] visits: Medication Reconciliation was completed by comparing discharge summary, patient's EMR and Pharmacy list, and upon discussion with patient.  Admitted to the hospital on 06/18/21 due to Sepsis. Discharge date was 06/25/21. Discharged from [place]?Medications Started at [facility] Discharge:?? -started *** due to ***  Medication Changes at Hospital Discharge: -Changed ***  Medications Discontinued at Hospital Discharge: -Stopped methadone, pregabalin due to somnolence. Dc'd with hospice  Medications that remain the same after Hospital Discharge:??  -All other medications will remain the same.     Objective:  Lab Results  Component Value Date   CREATININE 0.75 06/24/2021   BUN 13 06/24/2021   GFR 43.88 (L) 02/17/2019   GFRNONAA >60 06/24/2021   GFRAA >60 08/06/2020   NA 138 06/24/2021   K 3.3 (L) 06/24/2021   CALCIUM 8.9 06/24/2021   CO2 35 (H) 06/24/2021   GLUCOSE 84 06/24/2021     Lab Results  Component Value Date/Time   HGBA1C 5.2 02/17/2019 04:37 PM   HGBA1C 5.4 11/20/2017 02:37 PM   GFR 43.88 (L) 02/17/2019 04:37 PM   GFR 52.24 (L) 11/20/2017 02:37 PM    Last diabetic Eye exam: No results found for: HMDIABEYEEXA  Last diabetic Foot exam: No results found for: HMDIABFOOTEX   Lab Results  Component Value Date   CHOL 197 02/17/2019   HDL 50.10 02/17/2019   LDLCALC 119 (H) 02/17/2019   TRIG 138.0 02/17/2019   CHOLHDL 4 02/17/2019    Hepatic Function Latest Ref Rng & Units 06/18/2021 10/08/2020 10/07/2020  Total Protein 6.5 - 8.1 g/dL 6.5 5.4(L) 5.9(L)  Albumin 3.5 - 5.0 g/dL 2.9(L) 3.0(L) 3.0(L)  AST 15 - 41 U/L 23 12(L) 16  ALT 0 - 44 U/L _0 Alk Phosphatase 38 - 126 U/L 84 58 62  Total Bilirubin 0.3 - 1.2 mg/dL 0.9 1.1 1.4(H)  Bilirubin, Direct 0.0 - 0.3 mg/dL - - -    Lab Results  Component Value Date/Time   TSH 14.000 (H) 10/25/2020 11:16 AM   TSH 13.877 (H) 10/04/2020 02:40 PM   TSH 3.125 09/28/2020 05:09 AM   TSH 2.69 02/17/2019 04:37 PM   FREET4 0.90 02/17/2019 04:37 PM   FREET4 0.66 05/16/2016 04:10 PM    CBC Latest Ref Rng & Units 06/24/2021 06/23/2021 06/22/2021  WBC 4.0 - 10.5 K/uL 6.6 6.0 5.2  Hemoglobin 12.0 - 15.0 g/dL 12.8 12.3 11.3(L)  Hematocrit 36.0 - 46.0 % 37.5 36.3 33.6(L)  Platelets 150 - 400 K/uL 120(L) 144(L) 128(L)    Lab Results  Component Value Date/Time   VD25OH 40.08 02/17/2019 04:37 PM   VD25OH 30.04  11/18/2016 04:19 PM    Clinical ASCVD: {YES/NO:21197} The 10-year ASCVD risk score Mikey Bussing DC Jr., et al., 2013) is: 17.6%   Values used to calculate the score:     Age: 56 years     Sex: Female     Is Non-Hispanic African American: No     Diabetic: No     Tobacco smoker: No     Systolic Blood Pressure: 656 mmHg     Is BP treated: No     HDL Cholesterol: 50.1 mg/dL     Total Cholesterol: 197 mg/dL    No flowsheet data found.   ***Other: (CHADS2VASc if Afib, MMRC or CAT for COPD, ACT, DEXA)  Social  History   Tobacco Use  Smoking Status Former   Packs/day: 2.00   Years: 34.00   Pack years: 68.00   Types: Cigarettes   Quit date: 12/15/1996   Years since quitting: 24.6  Smokeless Tobacco Never   BP Readings from Last 3 Encounters:  06/25/21 (!) 151/71  06/06/21 (!) 170/95  01/01/21 (!) 145/88   Pulse Readings from Last 3 Encounters:  06/25/21 78  06/06/21 86  01/01/21 84   Wt Readings from Last 3 Encounters:  06/18/21 250 lb (113.4 kg)  06/06/21 250 lb (113.4 kg)  10/08/20 220 lb (99.8 kg)   BMI Readings from Last 3 Encounters:  06/18/21 40.35 kg/m  06/06/21 40.35 kg/m  12/24/20 35.51 kg/m    Assessment/Interventions: Review of patient past medical history, allergies, medications, health status, including review of consultants reports, laboratory and other test data, was performed as part of comprehensive evaluation and provision of chronic care management services.   SDOH:  (Social Determinants of Health) assessments and interventions performed: {yes/no:20286}  SDOH Screenings   Alcohol Screen: Not on file  Depression (PHQ2-9): Not on file  Financial Resource Strain: Not on file  Food Insecurity: Not on file  Housing: Not on file  Physical Activity: Not on file  Social Connections: Not on file  Stress: Not on file  Tobacco Use: Medium Risk   Smoking Tobacco Use: Former   Smokeless Tobacco Use: Never  Transportation Needs: Not on file    [place]  Allergies  Allergen Reactions   Prozac [Fluoxetine Hcl] Anaphylaxis   Codeine Itching   Morphine And Related Itching   Sulfa Antibiotics Itching and Nausea Only    Medications Reviewed Today     Reviewed by Lannette Donath, CPhT (Pharmacy Technician) on 06/19/21 at [place] List Status: Complete   Medication Order Taking? Sig Documenting Provider Last Dose Status Informant  atorvastatin (LIPITOR) 20 MG tablet [id number] Yes Take 1 tablet (20 mg total) by mouth 4 (four) times a week. Take 1 tablet  (20 mg) by mouth in the evening Saturdays, Sundays, Tuesdays & Thursdays. Leighton Ruff, MD 1/74/9449 Active Family Member  calcium carbonate (TUMS - DOSED IN MG ELEMENTAL CALCIUM) 500 MG chewable tablet [id number] Yes Chew 1 tablet by mouth daily as needed for indigestion or heartburn.  [provider] Past Month Active Family Member  Cholecalciferol (VITAMIN D-3) 1000 UNITS CAPS [id number] Yes Take 1,000 Units by mouth at bedtime. [provider] 06/17/2021 Active Family Member  Elastic Bandages & Supports (MEDICAL COMPRESSION [place]) [place] [id number] No Use daily  Patient not taking: Reported on 06/19/2021   Hoyt Koch, MD Not Taking Active Family Member  levothyroxine (SYNTHROID) 75 MCG tablet [id number] Yes TAKE 1 TABLET BY MOUTH  DAILY BEFORE BREAKFAST  Patient taking differently:  Take 75 mcg by mouth daily before breakfast.   Hoyt Koch, MD 06/17/2021 Active Family Member  methadone (DOLOPHINE) 5 MG tablet [id number] Yes Take 5 mg by mouth every 6 (six) hours. [provider] 06/17/2021 Active Family Member  omeprazole (PRILOSEC) 40 MG capsule [id number] Yes TAKE 1 CAPSULE BY MOUTH  DAILY  Patient taking differently: Take 40 mg by mouth in the morning.   Hoyt Koch, MD 06/17/2021 Active Family Member  ondansetron (ZOFRAN) 4 MG tablet [id number] Yes TAKE 1 TABLET(4 MG) BY MOUTH EVERY 8 HOURS AS NEEDED FOR NAUSEA OR VOMITING  Patient taking differently: Take 4 mg by mouth every 8 (eight) hours as needed for nausea or vomiting.   Hoyt Koch, MD Past Month Active Family Member  oxyCODONE-acetaminophen [facility]) 10-325 MG tablet [id number] Yes Take 1 tablet by mouth every 4 (four) hours as needed for pain. [provider] 06/17/2021 Active Family Member  phenylephrine-shark liver oil-mineral oil-petrolatum (PREPARATION H) 0.25-3-14-71.9 % rectal ointment [id number] Yes Place 1 application rectally 2 (two) times daily as needed for  hemorrhoids. [provider] unk Active Family Member  Polyethylene Glycol 3350 (MIRALAX PO) [id number] Yes Take 17 g by mouth daily as needed (constipation). [provider] unk Active Family Member  pregabalin (LYRICA) 75 MG capsule [id number] No Take 1 capsule (75 mg total) by mouth 2 (two) times daily.  Patient not taking: No sig reported   Hoyt Koch, MD Not Taking Active Family Member  rivaroxaban (XARELTO) 20 MG TABS tablet [id number] Yes Take 1 tablet (20 mg total) by mouth daily with supper.  Patient taking differently: Take 20 mg by mouth at bedtime.   Hoyt Koch, MD 06/17/2021 2000 Active Family Member           Med Note [place] Crane, MISTY D   Wed Jun 19, 2021  7:10 AM)    sertraline (ZOLOFT) 100 MG tablet [id number] Yes TAKE 2 TABLETS BY MOUTH AT  BEDTIME  Patient taking differently: Take 200 mg by mouth at bedtime.   Hoyt Koch, MD 06/17/2021 Active Family Member  vitamin B-12 (CYANOCOBALAMIN) 1000 MCG tablet [id number] Yes Take 1,000 mcg by mouth at bedtime. [provider] 06/17/2021 Active Family Member            Patient Active Problem List   Diagnosis Date Noted   Pneumonia 06/19/2021   UTI (urinary tract infection) 06/19/2021   Severe sepsis ([place]) 06/19/2021   Acute hypoxemic respiratory failure ([place]) 06/19/2021   Elevated troponin 06/19/2021   Gait abnormality 10/25/2020   Vitamin B12 deficiency 10/25/2020   Heme positive stool    Acute blood loss anemia    History of anal cancer    History of pulmonary embolism    Pulmonary embolism ([place]) 09/27/2020   Pain    Lumbar radiculopathy    Weakness of both legs 06/19/2020   Falls frequently 06/19/2020   Weakness 06/19/2020   Chronic respiratory failure with hypoxia ([place]) 12/15/2019   Orthostatic hypotension 12/15/2019   Numbness and tingling 12/15/2019   Chemotherapy-induced neutropenia (HCC)    Thrombocytopenia ([place]) 10/17/2019   Anal cancer ([place]) 09/15/2019    Routine general medical examination at a health care facility 11/19/2016   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   Morbid obesity ([place]) 06/26/2014   COPD GOLD II     Hyperlipidemia    Arthritis 01/16/2014   Depression 01/13/2014   Hypothyroidism 01/13/2014   GERD (gastroesophageal reflux disease) 01/13/2014   OCD (obsessive compulsive disorder) 01/13/2014  Fibromyalgia 01/13/2014    Immunization History  Administered Date(s) Administered   Fluad Quad(high Dose 65+) 09/19/2019   Influenza Split 10/22/2020   Influenza,inj,Quad PF,6+ Mos 09/29/2014, 10/04/2015   Influenza-Unspecified 09/15/2011, 09/22/2013, 09/09/2016, 01/02/2018, 09/11/2018   PFIZER(Purple Top)SARS-COV-2 Vaccination 02/20/2020, 03/20/2020   Pneumococcal Conjugate-13 10/04/2015   Pneumococcal Polysaccharide-23 04/17/2014   Td 01/23/2005   Tdap 05/16/2016    Conditions to be addressed/monitored:  {USCCMDZASSESSMENTOPTIONS:23563}  There are no care plans that you recently modified to display for this patient.    Medication Assistance: {MEDASSISTANCEINFO:25044}  Compliance/Adherence/Medication fill history: Care Gaps: Shingrix Covid booster (due 04/17/20) Mammogram (due 04/22/15)  Star-Rating Drugs: Atorvastatin - LF 01/28/21 x 90 ds  Patient's preferred pharmacy is:  [id number] [address], [address] [place] [address] Phone: [phone] Fax: [phone]  [facility]  ([address], [address] [place] [address] Phone: [phone] Fax: [phone]  Uses pill box? {Yes or If no, why not?:20788} Pt endorses ***% compliance  We discussed: {Pharmacy options:24294} Patient decided to: {US Pharmacy Plan:23885}  Care Plan and Follow Up Patient Decision:  {FOLLOWUP:24991}  Plan: {CM FOLLOW UP NTZG:01749}  ***    Current Barriers:   {pharmacybarriers:24917}  Pharmacist Clinical Goal(s):  Patient will {PHARMACYGOALCHOICES:24921} through collaboration with PharmD and provider.   Interventions: 1:1 collaboration with Hoyt Koch, MD regarding development and update of comprehensive plan of care as evidenced by provider attestation and co-signature Inter-disciplinary care team collaboration (see longitudinal plan of care) Comprehensive medication review performed; medication list updated in electronic medical record  Hx VTE   Hx PE 09/2020  Patient is currently controlled on the following medications:  Xarelto 20 mg daily  We discussed:  ***  Plan: Continue {CHL HP Upstream Pharmacy [phone]}  Hyperlipidemia    LDL goal < 100  Patient has failed these meds in past: n/a Patient is currently controlled on the following medications:  atorvastatin 20 mg QOD - MWF   We discussed:  diet and exercise extensively; pt is taking statin 3 times a week due to fear of side effects, she used to take daily and never had any issues. Discussed benefits of statin, pt agreed to gradually increase statin up to daily.   Plan: Recommended to increase atorvastatin to daily dosing   Hypothyroidism    Patient has failed these meds in past: n/a Patient is currently controlled on the following medications:  levothyroxine 75 mcg qAM - 6:30am, takes with Prilosec, pain med   We discussed:  Symptoms of hypothyroidism, pt is very fatigued but also recovering from radiation. Plans to discuss with oncologist.   Plan: Continue current medications   GERD    Patient has failed these meds in past: n/a Patient is currently controlled on the following medications:  omeprazole 40 mg daily    We discussed:  Had stopped taking it a few years back, had terrible GERD   Plan: Continue current medications   Depression / OCD    Patient has failed these meds in past: Patient is currently controlled on the following  medications:  sertraline 200 mg (100 mg x 2) HS   We discussed:  Depressed since 75 y/o, reports she has tried most other antidepressants. She still struggle with depression, discussed benefits of adding something like bupropion. She took that in the past for  smoking cessation and reports it didn't help, but may work syngergistically with sertraline. Pt wants to continue current therapy for now but will think about adding bupropion in the future.   Plan: Continue current medications    Hx anal cancer    Stable weakness in lower extremities, limiting ADLs  Patient is currently controlled on the following medications:  prochlorperazine 10 mg q6h prn,  Ondansetron 8 mg q8h prn,  Loperamide 2 mg PRN,  preparation H   We discussed:  Takes Nausea meds every few weeks, takes compazine for severe nausea. Oxycodone 6 per day, been on for 4 years.    Plan: Continue current medications  Chronic pain   Fibromyalgia, arthritis, hx cancer  Patient has failed these meds in past: *** Patient is currently {CHL Controlled/[phone]} on the following medications:  Oxycodone-apap 10-325 mg q4h prn (#180/mo) Methadone 5 mg   We discussed:  ***  Plan  Continue {CHL HP Upstream Pharmacy TWYSO:[id number]}  Patient Goals/Self-Care Activities Patient will:  - {pharmacypatientgoals:24919}

## 2021-08-12 ENCOUNTER — Encounter: Payer: Self-pay | Admitting: Gastroenterology

## 2021-09-21 DEATH — deceased

## 2021-10-31 ENCOUNTER — Encounter: Payer: Self-pay | Admitting: Oncology

## 2021-11-30 IMAGING — CT CT L SPINE W/O CM
3 series · 12 of 33 positions shown, 14 images · non-contrast
Comparison: Prior CT from 09/14/2019.

CLINICAL DATA: Initial evaluation lower back pain for 1 month, with
bilateral lower extremity weakness, frequent falls.

EXAM:
CT LUMBAR SPINE WITHOUT CONTRAST
TECHNIQUE: Multidetector CT imaging of the lumbar spine was performed without
intravenous contrast administration. Multiplanar CT image
reconstructions were also generated.

[Series 5: l spine 2.0 st · axial · 0.35mm/px · z∈[-557,-367]mm · 4 of 139 slices shown, 5 images]
[im 22/139  soft-tissue]
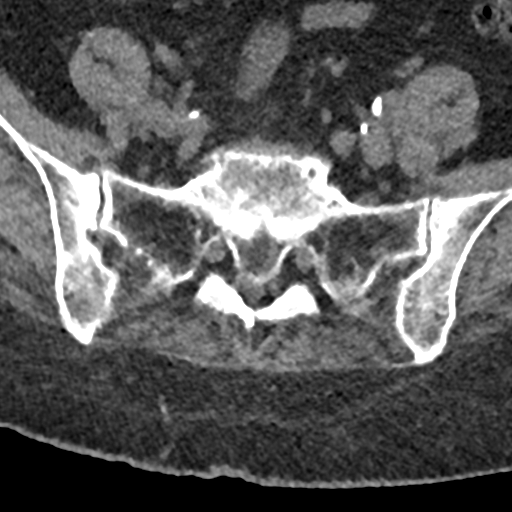
[im 22/139  bone]
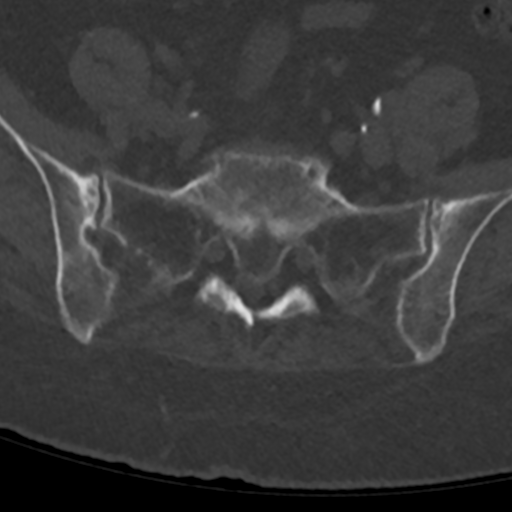
[im 54/139  bone]
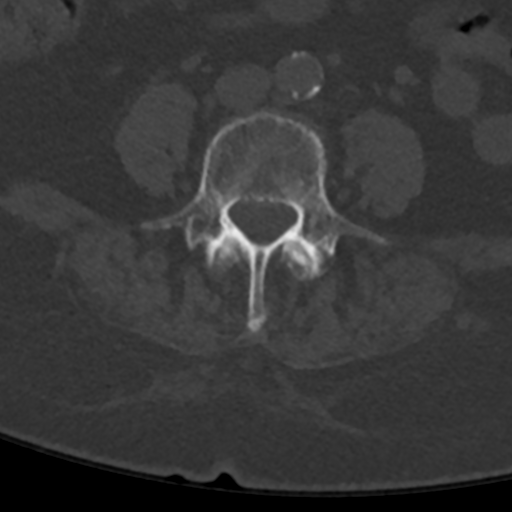
[im 85/139  bone]
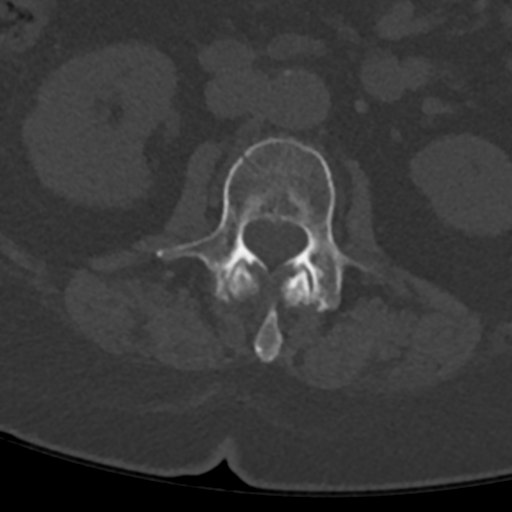
[im 117/139  bone]
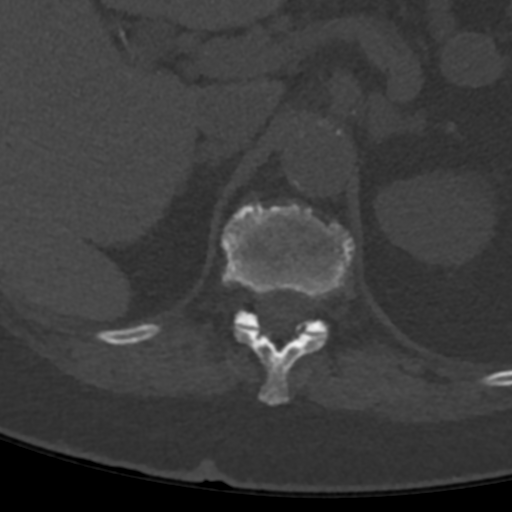

[Series 8: coronal st · coronal · 0.41mm/px · 3 of 84 slices shown]
[im 17/84  bone]
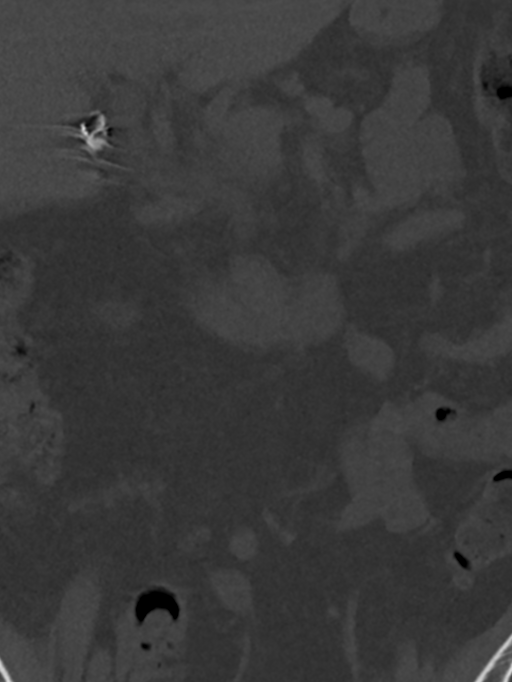
[im 34/84  bone]
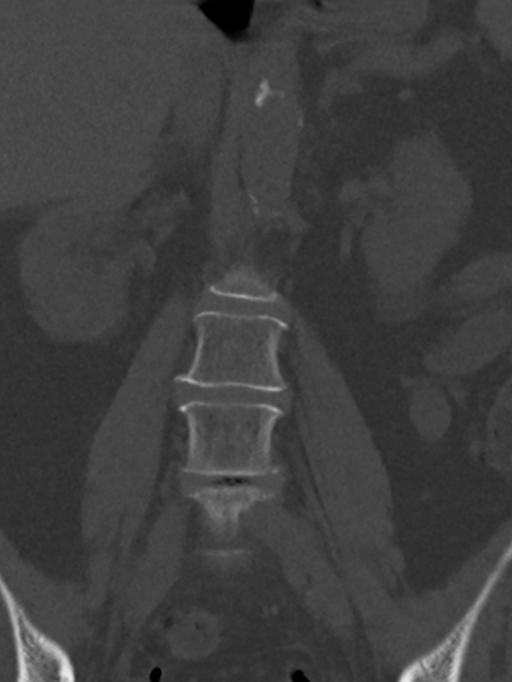
[im 50/84  bone]
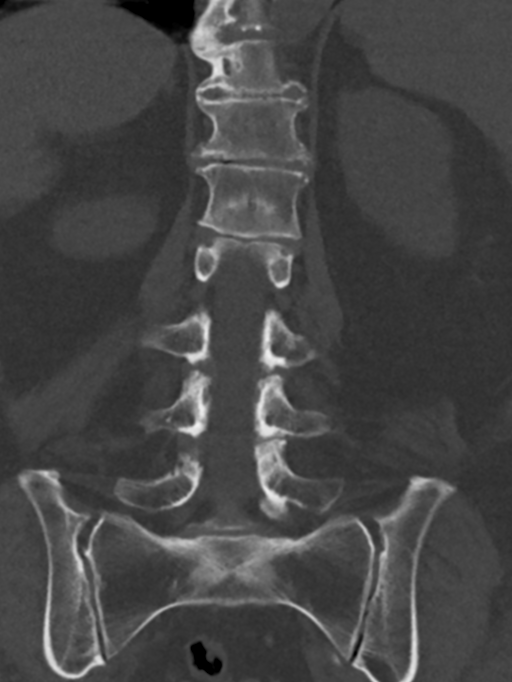

[Series 9: sagittal st · sagittal · 0.41mm/px · 5 of 70 slices shown, 6 images]
[im 24/70  bone]
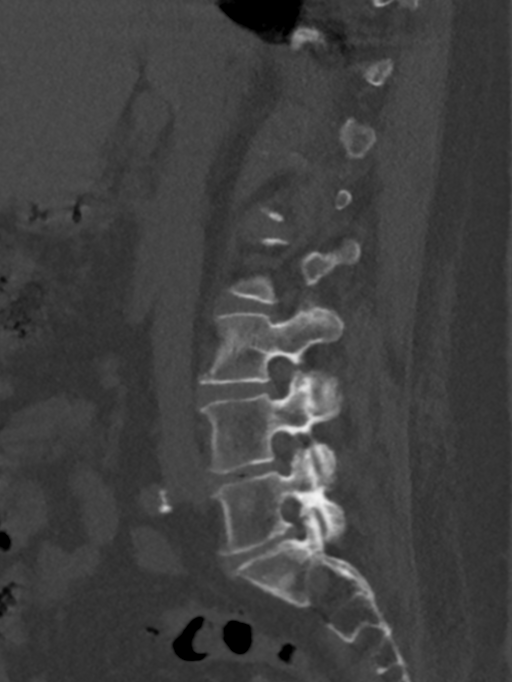
[im 29/70  bone]
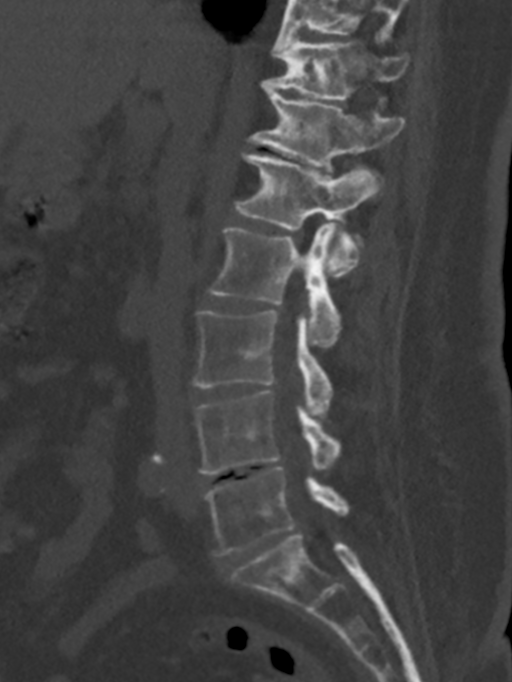
[im 35/70  soft-tissue]
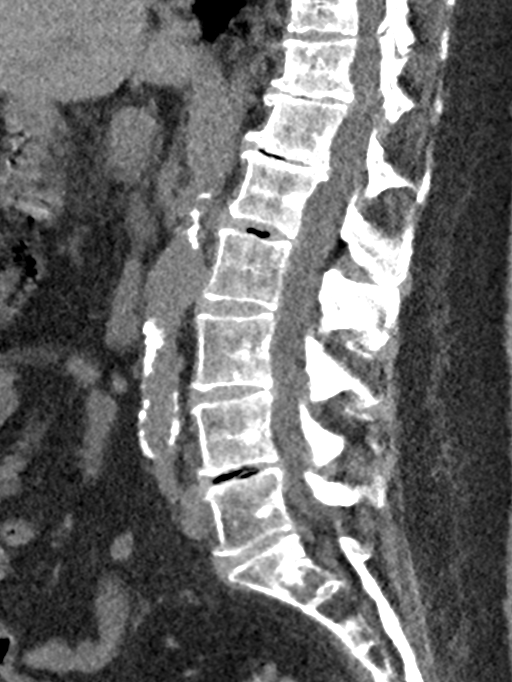
[im 35/70  bone]
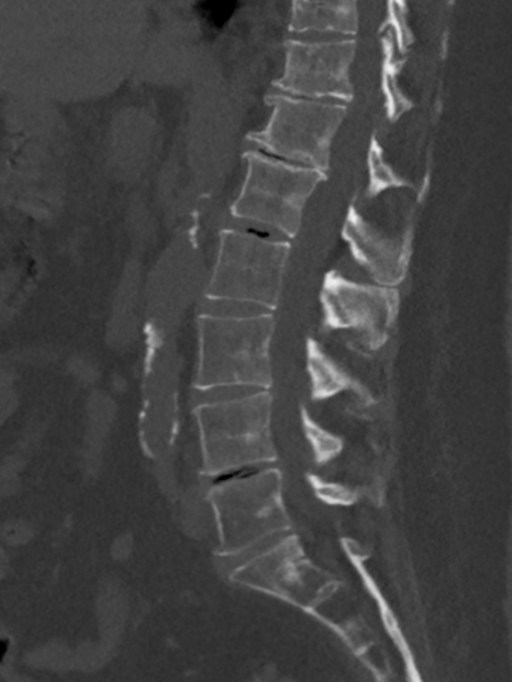
[im 41/70  bone]
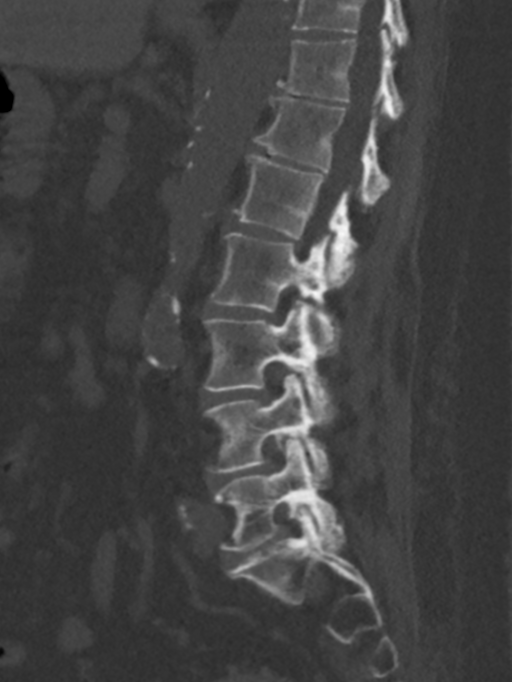
[im 47/70  bone]
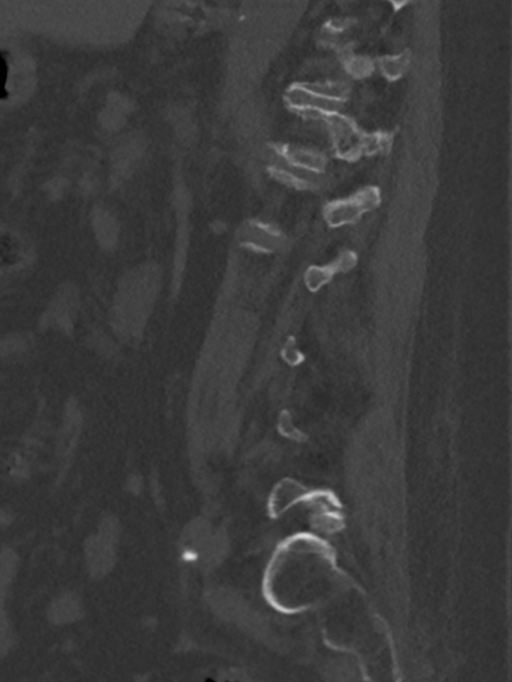

[12 of 33 positions shown; findings below may reference images not displayed]

FINDINGS: Segmentation: Standard. Lowest well-formed disc space labeled the
L5-S1 level.

Alignment: Mild levoscoliosis with apex at T12-L1.  No listhesis.

Vertebrae: Vertebral body height maintained without evidence for
acute or chronic fracture. Visualized sacrum and pelvis intact. No
discrete or worrisome osseous lesions.

Paraspinal and other soft tissues: Chronic fatty atrophy noted
within the posterior paraspinous soft tissues. There is suggestion
of soft tissue density/stranding involving the presacral space
anterior to the sacrum (series 5, image 139), incompletely
visualized. Findings are nonspecific, and could reflect post
radiation changes related to prior uterine cancer. Paraspinous soft
tissues demonstrate no other acute finding. Moderate aorto bi-iliac
atherosclerotic disease without aneurysm. Cholecystectomy clips
noted.

Disc levels:

T11-12: Degenerative intervertebral disc space narrowing with disc
desiccation and mild disc bulge. No significant stenosis.

T12-L1: Degenerative intervertebral disc space narrowing with disc
desiccation and mild disc bulge. Mild facet hypertrophy. No
significant stenosis.

L1-2: Mild diffuse disc bulge with disc desiccation.
Mild-to-moderate facet hypertrophy. No significant stenosis.

L2-3: Mild disc bulge. Mild to moderate facet hypertrophy. No
significant spinal stenosis. Foramina remain patent.

L3-4: Chronic intervertebral disc space narrowing with diffuse disc
bulge. Moderate bilateral facet hypertrophy. No spinal stenosis.
Mild bilateral L3 foraminal narrowing.

L4-5: Chronic intervertebral disc space narrowing with diffuse disc
bulge and disc desiccation. Moderate bilateral facet hypertrophy.
Resultant mild canal with bilateral lateral recess stenosis. Mild
right with moderate left L4 foraminal narrowing.

L5-S1: Chronic intervertebral disc space narrowing with diffuse disc
bulge. Mild reactive endplate changes. Moderate left worse than
right facet hypertrophy. No significant spinal stenosis. Moderate
bilateral L5 foraminal narrowing, greater on the left.
IMPRESSION: 1. No acute abnormality within the lumbar spine.
2. Multilevel degenerative spondylosis with resultant mild canal
with bilateral lateral recess stenosis at L4-5.
3. Moderate bilateral L4 and L5 foraminal stenosis related to disc
bulge, reactive endplate changes, and facet degeneration.
4. Soft tissue density/stranding involving the presacral space
anterior to the sacrum, incompletely visualized. Findings are
nonspecific, and could reflect post radiation changes related to
prior malignancy. Correlation with history recommended. If there is
no history of prior radiation treatment, further assessment with
dedicated CT of the abdomen and pelvis could be performed for
further characterization as warranted.
5. Aortic Atherosclerosis ([id number].V).
